# Patient Record
Sex: Female | Born: 1965
Health system: Southern US, Community
[De-identification: ages and names within clinical notes are randomized; demographics above are authoritative.]

## PROBLEM LIST (undated history)

## (undated) DIAGNOSIS — M1990 Unspecified osteoarthritis, unspecified site: Secondary | ICD-10-CM

## (undated) DIAGNOSIS — H269 Unspecified cataract: Secondary | ICD-10-CM

## (undated) DIAGNOSIS — F329 Major depressive disorder, single episode, unspecified: Secondary | ICD-10-CM

## (undated) DIAGNOSIS — R32 Unspecified urinary incontinence: Secondary | ICD-10-CM

## (undated) DIAGNOSIS — E079 Disorder of thyroid, unspecified: Secondary | ICD-10-CM

## (undated) DIAGNOSIS — R7303 Prediabetes: Secondary | ICD-10-CM

## (undated) DIAGNOSIS — E119 Type 2 diabetes mellitus without complications: Secondary | ICD-10-CM

## (undated) DIAGNOSIS — I10 Essential (primary) hypertension: Secondary | ICD-10-CM

## (undated) DIAGNOSIS — K219 Gastro-esophageal reflux disease without esophagitis: Secondary | ICD-10-CM

## (undated) DIAGNOSIS — R519 Headache, unspecified: Secondary | ICD-10-CM

## (undated) DIAGNOSIS — R011 Cardiac murmur, unspecified: Secondary | ICD-10-CM

## (undated) DIAGNOSIS — F419 Anxiety disorder, unspecified: Secondary | ICD-10-CM

## (undated) DIAGNOSIS — E785 Hyperlipidemia, unspecified: Secondary | ICD-10-CM

## (undated) DIAGNOSIS — I499 Cardiac arrhythmia, unspecified: Secondary | ICD-10-CM

## (undated) DIAGNOSIS — R5382 Chronic fatigue, unspecified: Secondary | ICD-10-CM

## (undated) DIAGNOSIS — I776 Arteritis, unspecified: Secondary | ICD-10-CM

## (undated) DIAGNOSIS — F32A Depression, unspecified: Secondary | ICD-10-CM

## (undated) DIAGNOSIS — G8929 Other chronic pain: Secondary | ICD-10-CM

## (undated) DIAGNOSIS — G43909 Migraine, unspecified, not intractable, without status migrainosus: Secondary | ICD-10-CM

## (undated) DIAGNOSIS — R51 Headache: Secondary | ICD-10-CM

## (undated) DIAGNOSIS — W57XXXA Bitten or stung by nonvenomous insect and other nonvenomous arthropods, initial encounter: Secondary | ICD-10-CM

## (undated) DIAGNOSIS — D649 Anemia, unspecified: Secondary | ICD-10-CM

## (undated) DIAGNOSIS — E7800 Pure hypercholesterolemia, unspecified: Secondary | ICD-10-CM

## (undated) DIAGNOSIS — M797 Fibromyalgia: Secondary | ICD-10-CM

## (undated) DIAGNOSIS — G4730 Sleep apnea, unspecified: Secondary | ICD-10-CM

## (undated) HISTORY — DX: Headache, unspecified: R51.9

## (undated) HISTORY — PX: NASAL SEPTUM SURGERY: SHX37

## (undated) HISTORY — DX: Anxiety disorder, unspecified: F41.9

## (undated) HISTORY — DX: Chronic fatigue, unspecified: R53.82

## (undated) HISTORY — DX: Cardiac murmur, unspecified: R01.1

## (undated) HISTORY — DX: Hyperlipidemia, unspecified: E78.5

## (undated) HISTORY — DX: Fibromyalgia: M79.7

## (undated) HISTORY — PX: OTHER SURGICAL HISTORY: SHX169

## (undated) HISTORY — DX: Anemia, unspecified: D64.9

## (undated) HISTORY — DX: Unspecified urinary incontinence: R32

## (undated) HISTORY — DX: Essential (primary) hypertension: I10

## (undated) HISTORY — DX: Sleep apnea, unspecified: G47.30

## (undated) HISTORY — DX: Pure hypercholesterolemia, unspecified: E78.00

## (undated) HISTORY — DX: Headache: R51

## (undated) HISTORY — DX: Major depressive disorder, single episode, unspecified: F32.9

## (undated) HISTORY — PX: SINOSCOPY: SHX187

## (undated) HISTORY — DX: Depression, unspecified: F32.A

## (undated) HISTORY — DX: Migraine, unspecified, not intractable, without status migrainosus: G43.909

## (undated) HISTORY — DX: Unspecified osteoarthritis, unspecified site: M19.90

## (undated) HISTORY — DX: Cardiac arrhythmia, unspecified: I49.9

## (undated) HISTORY — DX: Arteritis, unspecified: I77.6

## (undated) HISTORY — DX: Unspecified cataract: H26.9

## (undated) HISTORY — DX: Other chronic pain: G89.29

## (undated) HISTORY — DX: Disorder of thyroid, unspecified: E07.9

## (undated) HISTORY — DX: Gastro-esophageal reflux disease without esophagitis: K21.9

## (undated) HISTORY — DX: Prediabetes: R73.03

## (undated) HISTORY — DX: Bitten or stung by nonvenomous insect and other nonvenomous arthropods, initial encounter: W57.XXXA

---

## 2015-08-30 LAB — HEMOGLOBIN A1C: Hgb A1c MFr Bld: 6 % (ref 4.0–6.0)

## 2015-10-30 ENCOUNTER — Encounter: Payer: Self-pay | Admitting: "Endocrinology

## 2015-10-30 ENCOUNTER — Ambulatory Visit (INDEPENDENT_AMBULATORY_CARE_PROVIDER_SITE_OTHER): Payer: BLUE CROSS/BLUE SHIELD | Admitting: "Endocrinology

## 2015-10-30 VITALS — BP 129/85 | HR 94 | Ht 70.0 in | Wt 262.6 lb

## 2015-10-30 DIAGNOSIS — E6609 Other obesity due to excess calories: Secondary | ICD-10-CM

## 2015-10-30 DIAGNOSIS — E669 Obesity, unspecified: Secondary | ICD-10-CM | POA: Insufficient documentation

## 2015-10-30 DIAGNOSIS — R7303 Prediabetes: Secondary | ICD-10-CM | POA: Diagnosis not present

## 2015-10-30 DIAGNOSIS — E785 Hyperlipidemia, unspecified: Secondary | ICD-10-CM

## 2015-10-30 MED ORDER — METFORMIN HCL 500 MG PO TABS: 500.0000 mg | ORAL_TABLET | Freq: Every day | ORAL | Status: DC

## 2015-10-30 NOTE — Progress Notes (Signed)
Subjective:    Patient ID: Sydney Taylor, female    DOB: [birth year],    Past Medical History  Diagnosis Date  . Insect bite   . Anxiety   . Arrhythmia   . GERD (gastroesophageal reflux disease)   . Hypertension   . Urinary incontinence    Past Surgical History  Procedure Laterality Date  . Joint fusion on foot    . Nasal sinus surgery     Social History   Social History  . Marital Status: Single    Spouse Name: N/A  . Number of Children: N/A  . Years of Education: N/A   Social History Main Topics  . Smoking status: Former Research scientist (life sciences)  . Smokeless tobacco: Never Used  . Alcohol Use: 0.0 oz/week    0 Standard drinks or equivalent per week  . Drug Use: No  . Sexual Activity: No   Other Topics Concern  . None   Social History Narrative   Outpatient Encounter Prescriptions as of 10/30/2015  Medication Sig  . chlorthalidone (HYGROTON) 50 MG tablet Take 50 mg by mouth daily.  Marland Kitchen escitalopram (LEXAPRO) 10 MG tablet Take 10 mg by mouth daily.  Marland Kitchen lamoTRIgine (LAMICTAL) 100 MG tablet Take 100 mg by mouth daily.  Marland Kitchen lisinopril (PRINIVIL,ZESTRIL) 20 MG tablet Take 20 mg by mouth daily.  Marland Kitchen omeprazole (PRILOSEC) 20 MG capsule Take 20 mg by mouth daily.  . metFORMIN (GLUCOPHAGE) 500 MG tablet Take by mouth daily with breakfast.  . metFORMIN (GLUCOPHAGE) 500 MG tablet Take 1 tablet (500 mg total) by mouth daily with breakfast.  . metoprolol tartrate (LOPRESSOR) 25 MG tablet Take 25 mg by mouth 2 (two) times daily.   No facility-administered encounter medications on file as of 10/30/2015.   ALLERGIES: No Known Allergies VACCINATION STATUS:  There is no immunization history on file for this patient.  Diabetes She presents for her initial diabetic visit. Diabetes type: Prediabetes with A1c 6%. Her disease course has been stable. There are no hypoglycemic associated symptoms. Pertinent negatives for hypoglycemia include no confusion, headaches, pallor or seizures. There are no  diabetic associated symptoms. Pertinent negatives for diabetes include no chest pain, no polydipsia, no polyphagia and no polyuria. There are no hypoglycemic complications. Symptoms are stable. There are no diabetic complications. Risk factors for coronary artery disease include dyslipidemia, hypertension and obesity. When asked about current treatments, none were reported. Her weight is stable. An ACE inhibitor/angiotensin II receptor blocker is not being taken.  Hyperlipidemia This is a chronic problem. The current episode started more than 1 year ago. Pertinent negatives include no chest pain, myalgias or shortness of breath. She is currently on no antihyperlipidemic treatment.  Hypertension This is a chronic problem. The current episode started more than 1 year ago. Pertinent negatives include no chest pain, headaches, palpitations or shortness of breath. Past treatments include diuretics.     Review of Systems  Constitutional: Negative for unexpected weight change.  HENT: Negative for trouble swallowing and voice change.   Eyes: Negative for visual disturbance.  Respiratory: Negative for cough, shortness of breath and wheezing.   Cardiovascular: Negative for chest pain, palpitations and leg swelling.  Gastrointestinal: Negative for nausea, vomiting and diarrhea.  Endocrine: Negative for cold intolerance, heat intolerance, polydipsia, polyphagia and polyuria.  Musculoskeletal: Negative for myalgias and arthralgias.  Skin: Negative for color change, pallor, rash and wound.  Neurological: Negative for seizures and headaches.  Psychiatric/Behavioral: Negative for suicidal ideas and confusion.    Objective:  BP 129/85 mmHg  Pulse 94  Ht 5\' 10"  (1.778 m)  Wt 262 lb 9.6 oz (119.115 kg)  BMI 37.68 kg/m2  SpO2 98%  LMP 10/25/2015 (Approximate)  Wt Readings from Last 3 Encounters:  10/30/15 262 lb 9.6 oz (119.115 kg)    Physical Exam  Constitutional: She is oriented to person, place,  and time. She appears well-developed.  HENT:  Head: Normocephalic and atraumatic.  Eyes: EOM are normal.  Neck: Normal range of motion. Neck supple. No tracheal deviation present. No thyromegaly present.  Cardiovascular: Normal rate and regular rhythm.   Pulmonary/Chest: Effort normal and breath sounds normal.  Abdominal: Soft. Bowel sounds are normal. There is no tenderness. There is no guarding.  Musculoskeletal: Normal range of motion. She exhibits no edema.  Neurological: She is alert and oriented to person, place, and time. She has normal reflexes. No cranial nerve deficit. Coordination normal.  Skin: Skin is warm and dry. No rash noted. No erythema. No pallor.  Psychiatric: She has a normal mood and affect. Judgment normal.         Assessment & Plan:   1. Prediabetes Her a1c is 6%, has significant family hx of type 2 DM. - I have counseled the patient on diet management and weight loss, by adopting a carbohydrate restricted/protein rich diet.  - Suggestion is made for patient to avoid simple carbohydrates   from their diet including Cakes , Desserts, Ice Cream,  Soda (  diet and regular) , Sweet Tea , Candies,  Chips, Cookies, Artificial Sweeteners,   and "Sugar-free" Products . This will help patient to have stable blood glucose profile and potentially avoid unintended weight gain.  - I encouraged the patient to switch to  unprocessed or minimally processed complex starch and increased protein intake (animal or plant source), fruits, and vegetables.  - Patient is advised to stick to a routine mealtimes to eat 3 meals  a day and avoid unnecessary snacks ( to snack only to correct hypoglycemia).   - I have approached patient with the following individualized plan to manage diabetes and patient agrees:  -I discussed and initiated metformin 500mg  po qam after breakfast. She will need repeat basic metabolic  Panel, - Hemoglobin A1c, and - Lipid panel before next visit.  2.  Hyperlipidemia LDL 129, she is counseled on exercise, diet. She will have repeat fasting lipids . If LDL stays above 100, she will need low dose statin.  3. Obesity due to excess calories See #1 above.   I advised patient to maintain close follow up with their PCP for primary care needs. Follow up plan: Return in about 3 months (around 01/30/2016) for high cholesterol, prediabetes.  Glade Lloyd, MD Phone: [phone]  Fax: [phone]   10/30/2015, 4:00 PM

## 2015-10-30 NOTE — Patient Instructions (Signed)

## 2015-11-07 ENCOUNTER — Encounter: Payer: Self-pay | Admitting: Gastroenterology

## 2015-11-07 ENCOUNTER — Ambulatory Visit (INDEPENDENT_AMBULATORY_CARE_PROVIDER_SITE_OTHER): Payer: BLUE CROSS/BLUE SHIELD | Admitting: Gastroenterology

## 2015-11-07 VITALS — BP 118/90 | HR 100 | Ht 69.5 in | Wt 258.2 lb

## 2015-11-07 DIAGNOSIS — R195 Other fecal abnormalities: Secondary | ICD-10-CM

## 2015-11-07 DIAGNOSIS — K219 Gastro-esophageal reflux disease without esophagitis: Secondary | ICD-10-CM | POA: Diagnosis not present

## 2015-11-07 DIAGNOSIS — R1084 Generalized abdominal pain: Secondary | ICD-10-CM

## 2015-11-07 DIAGNOSIS — R079 Chest pain, unspecified: Secondary | ICD-10-CM

## 2015-11-07 MED ORDER — NA SULFATE-K SULFATE-MG SULF 17.5-3.13-1.6 GM/177ML PO SOLN: 1.0000 | Freq: Once | ORAL | Status: DC

## 2015-11-07 NOTE — Progress Notes (Deleted)
    History of Present Illness: This is a referred by No ref. provider found for the evaluation of ***  Review of Systems: Pertinent positive and negative review of systems were noted in the above HPI section. All other review of systems were otherwise negative.  Current Medications, Allergies, Past Medical History, Past Surgical History, Family History and Social History were reviewed in [employer] record.  Physical Exam: General: Well developed, well nourished, no acute distress Head: Normocephalic and atraumatic Eyes:  sclerae anicteric, EOMI Ears: Normal auditory acuity Mouth: No deformity or lesions Neck: Supple, no masses or thyromegaly Lungs: Clear throughout to auscultation Heart: Regular rate and rhythm; no murmurs, rubs or bruits Abdomen: Soft, non tender and non distended. No masses, hepatosplenomegaly or hernias noted. Normal Bowel sounds Rectal: Musculoskeletal: Symmetrical with no gross deformities  Skin: No lesions on visible extremities Pulses:  Normal pulses noted Extremities: No clubbing, cyanosis, edema or deformities noted Neurological: Alert oriented x 4, grossly nonfocal Cervical Nodes:  No significant cervical adenopathy Inguinal Nodes: No significant inguinal adenopathy Psychological:  Alert and cooperative. Normal mood and affect  Assessment and Recommendations:   cc: No referring provider defined for this encounter.

## 2015-11-07 NOTE — Progress Notes (Signed)
    History of Present Illness: This is a 49 year old female referred by Sydney Taylor, Sydney Taylor,* for the evaluation of heme positive stool, anemia, chest pain, abdominal pain and GERD. The patient relates a several year history of GERD that has been well controlled on AcipHex. She has had mild anemia documented on several occasions with recent hemoglobin 11.5. Recent stool tests were positive for occult blood. Patient notes episodes of postprandial generalized abdominal discomfort elicited with occasional gas and bloating that have happened frequently over the past 6 weeks. She also notes burning upper chest pain that is constant for the past 6 weeks and is not associated with meals or eating and does not change with meals or eating. Does not change with exertion or movement. This is very different than her prior GERD symptoms. Denies weight loss, constipation, diarrhea, change in stool caliber, melena, hematochezia, nausea, vomiting, dysphagia, reflux symptoms.  Review of Systems: Pertinent positive and negative review of systems were noted in the above HPI section. All other review of systems were otherwise negative.  Current Medications, Allergies, Past Medical History, Past Surgical History, Family History and Social History were reviewed in [employer] record.  Physical Exam: General: Well developed, well nourished, no acute distress Head: Normocephalic and atraumatic Eyes:  sclerae anicteric, EOMI Ears: Normal auditory acuity Mouth: No deformity or lesions Neck: Supple, no masses or thyromegaly Lungs: Clear throughout to auscultation Heart: Regular rate and rhythm; no murmurs, rubs or bruits Abdomen: Soft, non tender and non distended. No masses, hepatosplenomegaly or hernias noted. Normal Bowel sounds Rectal: deferred to colonoscopy Musculoskeletal: Symmetrical with no gross deformities  Skin: No lesions on visible extremities Pulses:  Normal pulses  noted Extremities: No clubbing, cyanosis, edema or deformities noted Neurological: Alert oriented x 4, grossly nonfocal Cervical Nodes:  No significant cervical adenopathy Inguinal Nodes: No significant inguinal adenopathy Psychological:  Alert and cooperative. Flat affect, difficult for her to provide details of symptoms, minimal eye contact.  Assessment and Recommendations:  1. Heme positive stool, anemia, chest pain, abdominal pain and GERD. Her chest pain does not appear to be gastrointestinal in etiology. Need to exclude colorectal neoplasms, ulcers, esophagitis and other disorders. Schedule colonoscopy and upper endoscopy. Continue antireflux measures and daily AcipHex. Consider abdominal imaging for further evaluation of abdominal pain if no source is determined on colonoscopy and upper endoscopy. The risks (including bleeding, perforation, infection, missed lesions, medication reactions and possible hospitalization or surgery if complications occur), benefits, and alternatives to colonoscopy with possible biopsy and possible polypectomy were discussed with the patient and they consent to proceed. The risks (including bleeding, perforation, infection, missed lesions, medication reactions and possible hospitalization or surgery if complications occur), benefits, and alternatives to endoscopy with possible biopsy and possible dilation were discussed with the patient and they consent to proceed.    cc: Yvone Neu, MD [facility] and [facility] [place] [address]

## 2015-11-07 NOTE — Patient Instructions (Signed)
You have been scheduled for an endoscopy and colonoscopy. Please follow the written instructions given to you at your visit today. Please pick up your prep supplies at the pharmacy within the next 1-3 days. If you use inhalers (even only as needed), please bring them with you on the day of your procedure. Your physician has requested that you go to www.startemmi.com and enter the access code given to you at your visit today. This web site gives a general overview about your procedure. However, you should still follow specific instructions given to you by our office regarding your preparation for the procedure.  Thank you for choosing me and [facility].  Pricilla Riffle. Dagoberto Ligas., MD., Marval Regal  cc: Lucianne Lei, MD

## 2015-12-31 HISTORY — PX: COLONOSCOPY: SHX174

## 2016-01-05 ENCOUNTER — Encounter: Payer: BLUE CROSS/BLUE SHIELD | Admitting: Gastroenterology

## 2016-02-01 ENCOUNTER — Ambulatory Visit: Payer: BLUE CROSS/BLUE SHIELD | Admitting: "Endocrinology

## 2016-08-19 LAB — HEMOGLOBIN A1C: HEMOGLOBIN A1C: 5.8

## 2016-08-26 LAB — LIPID PANEL
CHOLESTEROL: 205 mg/dL — AB (ref 0–200)
HDL: 54 mg/dL (ref 35–70)
LDL CALC: 122 mg/dL
Triglycerides: 145 mg/dL (ref 40–160)

## 2016-09-10 ENCOUNTER — Ambulatory Visit: Payer: BLUE CROSS/BLUE SHIELD | Admitting: "Endocrinology

## 2016-09-10 ENCOUNTER — Ambulatory Visit (INDEPENDENT_AMBULATORY_CARE_PROVIDER_SITE_OTHER): Payer: BLUE CROSS/BLUE SHIELD | Admitting: "Endocrinology

## 2016-09-10 ENCOUNTER — Encounter: Payer: Self-pay | Admitting: "Endocrinology

## 2016-09-10 VITALS — BP 124/83 | HR 91 | Ht 70.0 in | Wt 264.3 lb

## 2016-09-10 DIAGNOSIS — E6609 Other obesity due to excess calories: Secondary | ICD-10-CM

## 2016-09-10 DIAGNOSIS — R7303 Prediabetes: Secondary | ICD-10-CM

## 2016-09-10 DIAGNOSIS — E785 Hyperlipidemia, unspecified: Secondary | ICD-10-CM

## 2016-09-10 NOTE — Progress Notes (Signed)
Subjective:    Patient ID: Sydney Taylor, female    DOB: 12-Feb-1966,    Past Medical History:  Diagnosis Date  . Anemia   . Anxiety   . Arrhythmia   . Chronic headaches   . Depression   . Fibromyalgia   . GERD (gastroesophageal reflux disease)   . HLD (hyperlipidemia)   . Hypertension   . Insect bite   . Prediabetes   . Sleep apnea   . Urinary incontinence    Past Surgical History:  Procedure Laterality Date  . Joint fusion on foot Right   . NASAL SEPTUM SURGERY     Social History   Social History  . Marital status: Single    Spouse name: N/A  . Number of children: 0  . Years of education: N/A   Occupational History  . research associate/drug development    Social History Main Topics  . Smoking status: Former Smoker    Types: Cigarettes    Quit date: 12/30/1982  . Smokeless tobacco: Never Used  . Alcohol use 0.0 oz/week     Comment: 1 per month  . Drug use: No  . Sexual activity: No   Other Topics Concern  . None   Social History Narrative  . None   Outpatient Encounter Prescriptions as of 09/10/2016  Medication Sig  . chlorthalidone (HYGROTON) 50 MG tablet Take 50 mg by mouth daily.  Marland Kitchen escitalopram (LEXAPRO) 20 MG tablet Take 1 tablet by mouth daily.  Marland Kitchen lamoTRIgine (LAMICTAL) 100 MG tablet Take 100 mg by mouth daily.  Marland Kitchen lisinopril (PRINIVIL,ZESTRIL) 20 MG tablet Take 20 mg by mouth daily.  . metoprolol tartrate (LOPRESSOR) 25 MG tablet Take 25 mg by mouth 2 (two) times daily.  . montelukast (SINGULAIR) 10 MG tablet Take 1 tablet by mouth daily.  . Na Sulfate-K Sulfate-Mg Sulf SOLN Take 1 kit by mouth once.  . RABEprazole (ACIPHEX) 20 MG tablet Take 1 tablet by mouth daily.  . [DISCONTINUED] metFORMIN (GLUCOPHAGE) 500 MG tablet Take 1 tablet (500 mg total) by mouth daily with breakfast. (Patient not taking: Reported on 11/07/2015)   No facility-administered encounter medications on file as of 09/10/2016.    ALLERGIES: No Known  Allergies VACCINATION STATUS:  There is no immunization history on file for this patient.  Diabetes  She presents for her follow-up diabetic visit. Diabetes type: Prediabetes with A1c 6%. Her disease course has been stable. There are no hypoglycemic associated symptoms. Pertinent negatives for hypoglycemia include no confusion, headaches, pallor or seizures. There are no diabetic associated symptoms. Pertinent negatives for diabetes include no chest pain, no polydipsia, no polyphagia and no polyuria. There are no hypoglycemic complications. Symptoms are stable. There are no diabetic complications. Risk factors for coronary artery disease include dyslipidemia, hypertension and obesity. When asked about current treatments, none were reported. Her weight is stable. An ACE inhibitor/angiotensin II receptor blocker is not being taken.  Hyperlipidemia  This is a chronic problem. The current episode started more than 1 year ago. Pertinent negatives include no chest pain, myalgias or shortness of breath. She is currently on no antihyperlipidemic treatment.  Hypertension  This is a chronic problem. The current episode started more than 1 year ago. Pertinent negatives include no chest pain, headaches, palpitations or shortness of breath. Past treatments include diuretics.     Review of Systems  Constitutional: Positive for unexpected weight change.  HENT: Negative for trouble swallowing and voice change.   Eyes: Negative for visual disturbance.  Respiratory: Negative for cough, shortness of breath and wheezing.   Cardiovascular: Negative for chest pain, palpitations and leg swelling.  Gastrointestinal: Negative for diarrhea, nausea and vomiting.  Endocrine: Negative for cold intolerance, heat intolerance, polydipsia, polyphagia and polyuria.  Musculoskeletal: Negative for arthralgias and myalgias.  Skin: Negative for color change, pallor, rash and wound.  Neurological: Negative for seizures and  headaches.  Psychiatric/Behavioral: Negative for confusion and suicidal ideas.    Objective:    BP 124/83   Pulse 91   Ht '5\' 10"'$  (1.778 m)   Wt 264 lb 4.8 oz (119.9 kg)   BMI 37.92 kg/m   Wt Readings from Last 3 Encounters:  09/10/16 264 lb 4.8 oz (119.9 kg)  11/07/15 258 lb 4 oz (117.1 kg)  10/30/15 262 lb 9.6 oz (119.1 kg)    Physical Exam  Constitutional: She is oriented to person, place, and time. She appears well-developed.  HENT:  Head: Normocephalic and atraumatic.  Eyes: EOM are normal.  Neck: Normal range of motion. Neck supple. No tracheal deviation present. No thyromegaly present.  Cardiovascular: Normal rate and regular rhythm.   Pulmonary/Chest: Effort normal and breath sounds normal.  Abdominal: Soft. Bowel sounds are normal. There is no tenderness. There is no guarding.  Musculoskeletal: Normal range of motion. She exhibits no edema.  Neurological: She is alert and oriented to person, place, and time. She has normal reflexes. No cranial nerve deficit. Coordination normal.  Skin: Skin is warm and dry. No rash noted. No erythema. No pallor.  Psychiatric: She has a normal mood and affect. Judgment normal.    Recent Results (from the past 2160 hour(s))  Hemoglobin A1c     Status: None   Collection Time: 08/19/16 12:00 AM  Result Value Ref Range   Hemoglobin A1C 5.8   Lipid panel     Status: Abnormal   Collection Time: 08/26/16 12:00 AM  Result Value Ref Range   Triglycerides 145 40 - 160 mg/dL   Cholesterol 205 (A) 0 - 200 mg/dL   HDL 54 35 - 70 mg/dL   LDL Cholesterol 122 mg/dL     Assessment & Plan:   1. Prediabetes Her a1c is Slightly better at 5.8% compared to 6%, has significant family hx of type 2 DM. - I have counseled the patient on diet management and weight loss, by adopting a carbohydrate restricted/protein rich diet.  - Suggestion is made for patient to avoid simple carbohydrates   from their diet including Cakes , Desserts, Ice Cream,  Soda  (  diet and regular) , Sweet Tea , Candies,  Chips, Cookies, Artificial Sweeteners,   and "Sugar-free" Products . This will help patient to have stable blood glucose profile and potentially avoid unintended weight gain.  - I encouraged the patient to switch to  unprocessed or minimally processed complex starch and increased protein intake (animal or plant source), fruits, and vegetables.  - Patient is advised to stick to a routine mealtimes to eat 3 meals  a day and avoid unnecessary snacks ( to snack only to correct hypoglycemia).   - I have approached patient with the following individualized plan to manage diabetes and patient agrees:  - She did not tolerate  metformin  I prescribed last visit.  - She is advised to maximize her exercise and carbohydrate restriction.    2. Hyperlipidemia LDL is slightly better at 122 from 129, she is counseled on exercise, diet. She will have repeat fasting lipids . her HDL is .  She would like to avoid statin medications for now.   3. Obesity due to excess calories See #1 above. I have advised her to walk at least one hour 3-4 days a week. There is major drug drug interaction of Qsymia with Lexapro . Her next option is metabolic surgery which she is hesitating to proceed with this point.    I advised patient to maintain close follow up with their PCP for primary care needs. Follow up plan: Return in about 3 months (around 12/10/2016) for follow up with no labs.  Glade Lloyd, MD Phone: [phone]  Fax: [phone]   09/10/2016, 11:23 AM

## 2016-09-18 ENCOUNTER — Encounter: Payer: Self-pay | Admitting: "Endocrinology

## 2016-12-18 ENCOUNTER — Other Ambulatory Visit: Payer: Self-pay | Admitting: Orthopedic Surgery

## 2016-12-18 DIAGNOSIS — M25512 Pain in left shoulder: Secondary | ICD-10-CM

## 2016-12-19 ENCOUNTER — Ambulatory Visit: Payer: BLUE CROSS/BLUE SHIELD | Admitting: "Endocrinology

## 2016-12-28 ENCOUNTER — Ambulatory Visit
Admission: RE | Admit: 2016-12-28 | Discharge: 2016-12-28 | Disposition: A | Discharge status: Home / Self Care | Payer: BLUE CROSS/BLUE SHIELD | Source: Ambulatory Visit | Attending: Orthopedic Surgery | Admitting: Orthopedic Surgery

## 2016-12-28 DIAGNOSIS — M25512 Pain in left shoulder: Secondary | ICD-10-CM

## 2019-10-30 ENCOUNTER — Observation Stay (HOSPITAL_COMMUNITY)
Admission: EM | Admit: 2019-10-30 | Discharge: 2019-11-01 | Disposition: A | Discharge status: Home / Self Care | Payer: No Typology Code available for payment source | Attending: Family Medicine | Admitting: Family Medicine

## 2019-10-30 ENCOUNTER — Other Ambulatory Visit: Payer: Self-pay

## 2019-10-30 ENCOUNTER — Emergency Department (HOSPITAL_COMMUNITY): Payer: No Typology Code available for payment source

## 2019-10-30 ENCOUNTER — Encounter (HOSPITAL_COMMUNITY): Payer: Self-pay | Admitting: Emergency Medicine

## 2019-10-30 DIAGNOSIS — N390 Urinary tract infection, site not specified: Secondary | ICD-10-CM | POA: Diagnosis not present

## 2019-10-30 DIAGNOSIS — E876 Hypokalemia: Secondary | ICD-10-CM | POA: Insufficient documentation

## 2019-10-30 DIAGNOSIS — Z87891 Personal history of nicotine dependence: Secondary | ICD-10-CM | POA: Insufficient documentation

## 2019-10-30 DIAGNOSIS — R7303 Prediabetes: Secondary | ICD-10-CM | POA: Diagnosis not present

## 2019-10-30 DIAGNOSIS — R5383 Other fatigue: Secondary | ICD-10-CM | POA: Diagnosis present

## 2019-10-30 DIAGNOSIS — Z20828 Contact with and (suspected) exposure to other viral communicable diseases: Secondary | ICD-10-CM | POA: Insufficient documentation

## 2019-10-30 DIAGNOSIS — Z23 Encounter for immunization: Secondary | ICD-10-CM | POA: Diagnosis not present

## 2019-10-30 DIAGNOSIS — E669 Obesity, unspecified: Secondary | ICD-10-CM | POA: Insufficient documentation

## 2019-10-30 DIAGNOSIS — Z79899 Other long term (current) drug therapy: Secondary | ICD-10-CM | POA: Diagnosis not present

## 2019-10-30 DIAGNOSIS — E785 Hyperlipidemia, unspecified: Secondary | ICD-10-CM | POA: Diagnosis present

## 2019-10-30 DIAGNOSIS — E119 Type 2 diabetes mellitus without complications: Secondary | ICD-10-CM | POA: Diagnosis not present

## 2019-10-30 DIAGNOSIS — Z6837 Body mass index (BMI) 37.0-37.9, adult: Secondary | ICD-10-CM | POA: Insufficient documentation

## 2019-10-30 DIAGNOSIS — I10 Essential (primary) hypertension: Secondary | ICD-10-CM | POA: Insufficient documentation

## 2019-10-30 DIAGNOSIS — R000 Tachycardia, unspecified: Secondary | ICD-10-CM | POA: Diagnosis present

## 2019-10-30 HISTORY — DX: Type 2 diabetes mellitus without complications: E11.9

## 2019-10-30 LAB — CBC WITH DIFFERENTIAL/PLATELET
Abs Immature Granulocytes: 0.09 10*3/uL — ABNORMAL HIGH (ref 0.00–0.07)
Basophils Absolute: 0.1 10*3/uL (ref 0.0–0.1)
Basophils Relative: 1 %
Eosinophils Absolute: 0.3 10*3/uL (ref 0.0–0.5)
Eosinophils Relative: 3 %
HCT: 46.3 % — ABNORMAL HIGH (ref 36.0–46.0)
Hemoglobin: 15 g/dL (ref 12.0–15.0)
Immature Granulocytes: 1 %
Lymphocytes Relative: 22 %
Lymphs Abs: 2.5 10*3/uL (ref 0.7–4.0)
MCH: 29.8 pg (ref 26.0–34.0)
MCHC: 32.4 g/dL (ref 30.0–36.0)
MCV: 92 fL (ref 80.0–100.0)
Monocytes Absolute: 0.9 10*3/uL (ref 0.1–1.0)
Monocytes Relative: 8 %
Neutro Abs: 7.3 10*3/uL (ref 1.7–7.7)
Neutrophils Relative %: 65 %
Platelets: 327 10*3/uL (ref 150–400)
RBC: 5.03 MIL/uL (ref 3.87–5.11)
RDW: 13.3 % (ref 11.5–15.5)
WBC: 11.3 10*3/uL — ABNORMAL HIGH (ref 4.0–10.5)
nRBC: 0 % (ref 0.0–0.2)

## 2019-10-30 LAB — COMPREHENSIVE METABOLIC PANEL
ALT: 36 U/L (ref 0–44)
AST: 27 U/L (ref 15–41)
Albumin: 3.9 g/dL (ref 3.5–5.0)
Alkaline Phosphatase: 83 U/L (ref 38–126)
Anion gap: 12 (ref 5–15)
BUN: 13 mg/dL (ref 6–20)
CO2: 20 mmol/L — ABNORMAL LOW (ref 22–32)
Calcium: 8.9 mg/dL (ref 8.9–10.3)
Chloride: 105 mmol/L (ref 98–111)
Creatinine, Ser: 1.08 mg/dL — ABNORMAL HIGH (ref 0.44–1.00)
GFR calc Af Amer: >60 mL/min (ref >60)
GFR calc non Af Amer: 58 mL/min — ABNORMAL LOW (ref >60)
Glucose, Bld: 127 mg/dL — ABNORMAL HIGH (ref 70–99)
Potassium: 3.4 mmol/L — ABNORMAL LOW (ref 3.5–5.1)
Sodium: 137 mmol/L (ref 135–145)
Total Bilirubin: 0.6 mg/dL (ref 0.3–1.2)
Total Protein: 6.9 g/dL (ref 6.5–8.1)

## 2019-10-30 LAB — INFLUENZA PANEL BY PCR (TYPE A & B)
Influenza A By PCR: NEGATIVE
Influenza B By PCR: NEGATIVE

## 2019-10-30 LAB — URINALYSIS, ROUTINE W REFLEX MICROSCOPIC
Bilirubin Urine: NEGATIVE
Glucose, UA: NEGATIVE mg/dL
Hgb urine dipstick: NEGATIVE
Ketones, ur: NEGATIVE mg/dL
Nitrite: NEGATIVE
Protein, ur: NEGATIVE mg/dL
Specific Gravity, Urine: 1.019 (ref 1.005–1.030)
pH: 5 (ref 5.0–8.0)

## 2019-10-30 LAB — LACTIC ACID, PLASMA
Lactic Acid, Venous: 2.9 mmol/L (ref 0.5–1.9)
Lactic Acid, Venous: 2.4 mmol/L (ref 0.5–1.9)

## 2019-10-30 LAB — SARS CORONAVIRUS 2 BY RT PCR (HOSPITAL ORDER, PERFORMED IN ~~LOC~~ HOSPITAL LAB): SARS Coronavirus 2: NEGATIVE

## 2019-10-30 LAB — LIPASE, BLOOD: Lipase: 25 U/L (ref 11–51)

## 2019-10-30 MED ORDER — SODIUM CHLORIDE 0.9 % IV SOLN
1.0000 g | Freq: Once | INTRAVENOUS | Status: AC
  Administered 2019-10-30: 1 g via INTRAVENOUS
  Filled 2019-10-30: qty 10

## 2019-10-30 MED ORDER — SODIUM CHLORIDE 0.9 % IV BOLUS
500.0000 mL | Freq: Once | INTRAVENOUS | Status: AC
  Administered 2019-10-30: 500 mL via INTRAVENOUS

## 2019-10-30 MED ORDER — SODIUM CHLORIDE 0.9 % IV SOLN
500.0000 mg | Freq: Once | INTRAVENOUS | Status: AC
  Administered 2019-10-30: 500 mg via INTRAVENOUS
  Filled 2019-10-30: qty 500

## 2019-10-30 MED ORDER — SODIUM CHLORIDE 0.9 % IV SOLN
Freq: Once | INTRAVENOUS | Status: AC
  Administered 2019-10-31: via INTRAVENOUS

## 2019-10-30 NOTE — ED Notes (Signed)
Date and time results received: 10/30/19 11:04 PM  (use smartphrase ".now" to insert current time)  Test: lactic Critical Value: 2.4  Name of Provider Notified: hobson   Orders Received? Or Actions Taken?:

## 2019-10-30 NOTE — ED Provider Notes (Signed)
[facility] EMERGENCY DEPARTMENT Provider Note   CSN: [id number] Arrival date & time: 10/30/19  1918     History   Chief Complaint Chief Complaint  Patient presents with  . Body aches / headache    HPI Kalanie Fewell is a 53 y.o. female history of obesity, diabetes, fibromyalgia, hyperlipidemia, GERD presents today for viral illness.  Patient reports that 10 days ago she developed symptoms of a UTI, she has been treated with Bactrim for the past 7 days for these symptoms she reports that her UTI had resolved as of 5 days ago.  Patient reports that 4 days ago she developed fatigue, generalized body aches, chills without measured fever as well as cough and headache.  She states that symptoms have progressed since onset.  She describes generalized body aches as a moderate intensity aching sensation diffuse without focal tenderness.  Headache is a mild bilateral throbbing without radiation or aggravating/alleviating factors.  Cough nonproductive intermittent.  She reports that she was seen at an urgent care in [place] earlier today and was tested for COVID-19 virus as well as flu which were both reported negative.  Patient reports that she continued to feel unwell so she checked into the ER for further evaluation.  Denies measured fever, vision changes, balance issues, confusion, numbness/weakness, tingling, neck stiffness, chest pain/shortness of breath, abdominal pain, nausea/vomiting, diarrhea, dysuria/hematuria, extremity swelling/color change or any additional concerns.    HPI  Past Medical History:  Diagnosis Date  . Anemia   . Anxiety   . Arrhythmia   . Chronic headaches   . Depression   . Diabetes mellitus without complication ([place])   . Fibromyalgia   . GERD (gastroesophageal reflux disease)   . HLD (hyperlipidemia)   . Hypertension   . Insect bite   . Prediabetes   . Sleep apnea   . Urinary incontinence     Patient Active Problem List   Diagnosis Date  Noted  . Prediabetes 10/30/2015  . Hyperlipidemia 10/30/2015  . Obesity due to excess calories 10/30/2015    Past Surgical History:  Procedure Laterality Date  . Joint fusion on foot Right   . NASAL SEPTUM SURGERY       OB History   No obstetric history on file.      Home Medications    Prior to Admission medications   Medication Sig Start Date End Date Taking? Authorizing Provider  amitriptyline (ELAVIL) 25 MG tablet Take 37.5 mg by mouth at bedtime. 10/06/19  Yes [provider]  chlorthalidone (HYGROTON) 50 MG tablet Take 50 mg by mouth daily.   Yes [provider]  lamoTRIgine (LAMICTAL) 100 MG tablet Take 100 mg by mouth daily.   Yes [provider]  losartan (COZAAR) 25 MG tablet Take 25 mg by mouth daily.   Yes [provider]  REXULTI 2 MG TABS tablet Take 2 mg by mouth daily. 10/05/19  Yes [provider]  escitalopram (LEXAPRO) 20 MG tablet Take 1 tablet by mouth daily. 09/14/15   [provider]  lisinopril (PRINIVIL,ZESTRIL) 20 MG tablet Take 20 mg by mouth daily.    [provider]  metoprolol tartrate (LOPRESSOR) 25 MG tablet Take 25 mg by mouth 2 (two) times daily.    [provider]  montelukast (SINGULAIR) 10 MG tablet Take 1 tablet by mouth daily. 10/21/15   [provider]  Na Sulfate-K Sulfate-Mg Sulf SOLN Take 1 kit by mouth once. 11/07/15   Ladene Artist, MD  RABEprazole (ACIPHEX) 20 MG tablet Take 1 tablet by mouth daily. 10/21/15   [provider]    Family History Family History  Problem Relation Age of Onset  . Hypertension Father   . Hyperlipidemia Father   . Diabetes Father   . Hypertension Mother   . Hyperlipidemia Mother   . Diabetes Mellitus II Mother   . Congestive Heart Failure Mother   . Hypertension Sister   . Diabetes Sister   . Hypertension Brother   . Diabetes Maternal Grandmother   . Lupus Paternal Grandmother     Social History Social  History   Tobacco Use  . Smoking status: Former Smoker    Types: Cigarettes    Quit date: 12/30/1982    Years since quitting: 36.8  . Smokeless tobacco: Never Used  Substance Use Topics  . Alcohol use: Yes    Alcohol/week: 0.0 standard drinks    Comment: 1 per month  . Drug use: No     Allergies   Patient has no known allergies.   Review of Systems Review of Systems Ten systems are reviewed and are negative for acute change except as noted in the HPI   Physical Exam Updated Vital Signs BP (!) 137/104   Pulse (!) 120   Temp 98.4 F (36.9 C)   Resp 18   Ht _0  (1.778 m)   Wt 117.9 kg   SpO2 96%   BMI 37.31 kg/m   Physical Exam Constitutional:      General: She is not in acute distress.    Appearance: Normal appearance. She is well-developed. She is obese. She is not ill-appearing or diaphoretic.     Comments: Tired appearing  HENT:     Head: Normocephalic and atraumatic.     Right Ear: External ear normal.     Left Ear: External ear normal.     Nose: Nose normal.  Eyes:     General: Vision grossly intact. Gaze aligned appropriately.     Pupils: Pupils are equal, round, and reactive to light.  Neck:     Musculoskeletal: Normal range of motion.     Trachea: Trachea and phonation normal. No tracheal deviation.  Cardiovascular:     Rate and Rhythm: Regular rhythm. Tachycardia present.     Pulses: Normal pulses.     Heart sounds: Normal heart sounds.  Pulmonary:     Effort: Pulmonary effort is normal. No respiratory distress.     Breath sounds: Normal breath sounds.  Abdominal:     General: There is no distension.     Palpations: Abdomen is soft.     Tenderness: There is no abdominal tenderness. There is no guarding or rebound.  Musculoskeletal: Normal range of motion.     Right lower leg: No edema.     Left lower leg: No edema.  Skin:    General: Skin is warm and dry.  Neurological:     Mental Status: She is alert.     GCS: GCS eye subscore is 4. GCS  verbal subscore is 5. GCS motor subscore is 6.     Comments: Speech is clear and goal oriented, follows commands Major Cranial nerves without deficit, no facial droop Normal strength in upper and lower extremities bilaterally including dorsiflexion and plantar flexion, strong and equal grip strength Sensation normal to light and sharp touch Moves extremities without ataxia, coordination intact Normal finger to nose and rapid alternating movements Neg romberg, no pronator drift Normal gait  Psychiatric:  Behavior: Behavior normal.    ED Treatments / Results  Labs (all labs ordered are listed, but only abnormal results are displayed) Labs Reviewed - No data to display  EKG EKG Interpretation  Date/Time:  Saturday October 30 2019 20:55:08 EDT Ventricular Rate:  105 PR Interval:    QRS Duration: 82 QT Interval:  321 QTC Calculation: 425 R Axis:   76 Text Interpretation: Sinus tachycardia Low voltage, precordial leads Borderline T abnormalities, diffuse leads No STEMI Confirmed by Octaviano Glow [phone]) on 10/30/2019 9:24:43 PM   Radiology No results found.  Procedures Procedures (including critical care time)  Medications Ordered in ED Medications - No data to display   Initial Impression / Assessment and Plan / ED Course  I have reviewed the triage vital signs and the nursing notes.  Pertinent labs & imaging results that were available during my care of the patient were reviewed by me and considered in my medical decision making (see chart for details).    53 year old female arrives today with what appears to be a viral illness for the past 6 to 4 days.  She is currently being treated for your UTI but the symptoms have been resolving prior to onset of these new symptoms.  She is tired appearing on arrival but in no acute distress, nontoxic.  No focal neuro deficits on examination, no meningeal signs, she is mildly tachycardic with normal heart sounds, lung sounds  clear on examination, abdomen soft nontender without peritoneal signs, neurovascular tact to all 4 extremities without sign of DVT.  Patient does appear mildly dehydrated reports decreased p.o. over the past 4 days.  She has negative Covid test and negative flu test at an urgent care [place] of state however my suspicion is elevated for Covid at this time. - CBC with leukocytosis of 11.3 without left shift Lipase within normal limits CMP with creatinine 1.08 Urinalysis pending Beta-hCG pending Lactic 2.9 Chest x-ray:  IMPRESSION:  Mild opacity at the left base may represent atelectasis versus  subtle early infiltrate. Recommend clinical correlation and  attention on follow-up.   EKG: Sinus tachycardia Low voltage, precordial leads Borderline T abnormalities, diffuse leads No STEMI Confirmed by Octaviano Glow [phone]) on 10/30/2019 9:24:43 PM - Discussed case with Dr. Langston Masker, patient does not meet sepsis criteria at this time and has WBC under 12,000, regular respiratory rate and no fever.  Will begin patient on Rocephin/azithromycin for community-acquired pneumonia and give fluid bolus.  Suspicion is high for COVID-19 virus at this time will send rapid test however patient not requiring supplemental oxygen and her heart rate is improving with a fluid bolus.  Patient will need observation here in the ED prior to final disposition and await remainder of laboratory work-up.  Care handoff given to Waynard Reeds, PA-C at shift change who will follow pending labs, reassess and disposition per oncoming team.  Note: Portions of this report may have been transcribed using voice recognition software. Every effort was made to ensure accuracy; however, inadvertent computerized transcription errors may still be present. Final Clinical Impressions(s) / ED Diagnoses   Final diagnoses:  None    ED Discharge Orders    None       Gari Crown 10/30/19 2214    Wyvonnia Dusky, MD 10/31/19  1037

## 2019-10-30 NOTE — ED Notes (Signed)
Date and time results received: 10/30/19 2120  Test: Lactic Critical Value: 2.9  Name of Provider Notified: Langston Masker, MD

## 2019-10-30 NOTE — ED Triage Notes (Signed)
Pt came from [place] with c/o headache, body aches, chills, and fatigue since Sunday. States she was treated for a UTI and has felt bad since. Covid and flu were negative at DME.

## 2019-10-31 ENCOUNTER — Encounter (HOSPITAL_COMMUNITY): Payer: Self-pay | Admitting: Internal Medicine

## 2019-10-31 ENCOUNTER — Observation Stay (HOSPITAL_COMMUNITY): Payer: No Typology Code available for payment source

## 2019-10-31 DIAGNOSIS — R000 Tachycardia, unspecified: Secondary | ICD-10-CM | POA: Diagnosis not present

## 2019-10-31 DIAGNOSIS — E876 Hypokalemia: Secondary | ICD-10-CM | POA: Diagnosis not present

## 2019-10-31 DIAGNOSIS — N390 Urinary tract infection, site not specified: Secondary | ICD-10-CM | POA: Diagnosis not present

## 2019-10-31 DIAGNOSIS — E785 Hyperlipidemia, unspecified: Secondary | ICD-10-CM

## 2019-10-31 LAB — TROPONIN I (HIGH SENSITIVITY)
Troponin I (High Sensitivity): 2 ng/L (ref <18)
Troponin I (High Sensitivity): 3 ng/L (ref <18)

## 2019-10-31 LAB — CBC
HCT: 41.7 % (ref 36.0–46.0)
Hemoglobin: 13.2 g/dL (ref 12.0–15.0)
MCH: 29.7 pg (ref 26.0–34.0)
MCHC: 31.7 g/dL (ref 30.0–36.0)
MCV: 93.7 fL (ref 80.0–100.0)
Platelets: 277 10*3/uL (ref 150–400)
RBC: 4.45 MIL/uL (ref 3.87–5.11)
RDW: 13.2 % (ref 11.5–15.5)
WBC: 11.8 10*3/uL — ABNORMAL HIGH (ref 4.0–10.5)
nRBC: 0 % (ref 0.0–0.2)

## 2019-10-31 LAB — COMPREHENSIVE METABOLIC PANEL
ALT: 30 U/L (ref 0–44)
AST: 22 U/L (ref 15–41)
Albumin: 3.4 g/dL — ABNORMAL LOW (ref 3.5–5.0)
Alkaline Phosphatase: 70 U/L (ref 38–126)
Anion gap: 9 (ref 5–15)
BUN: 11 mg/dL (ref 6–20)
CO2: 21 mmol/L — ABNORMAL LOW (ref 22–32)
Calcium: 7.8 mg/dL — ABNORMAL LOW (ref 8.9–10.3)
Chloride: 105 mmol/L (ref 98–111)
Creatinine, Ser: 1.06 mg/dL — ABNORMAL HIGH (ref 0.44–1.00)
GFR calc Af Amer: >60 mL/min (ref >60)
GFR calc non Af Amer: 59 mL/min — ABNORMAL LOW (ref >60)
Glucose, Bld: 124 mg/dL — ABNORMAL HIGH (ref 70–99)
Potassium: 3 mmol/L — ABNORMAL LOW (ref 3.5–5.1)
Sodium: 135 mmol/L (ref 135–145)
Total Bilirubin: 0.6 mg/dL (ref 0.3–1.2)
Total Protein: 6 g/dL — ABNORMAL LOW (ref 6.5–8.1)

## 2019-10-31 LAB — HIV ANTIBODY (ROUTINE TESTING W REFLEX): HIV Screen 4th Generation wRfx: NONREACTIVE

## 2019-10-31 LAB — STREP PNEUMONIAE URINARY ANTIGEN: Strep Pneumo Urinary Antigen: NEGATIVE

## 2019-10-31 MED ORDER — SODIUM CHLORIDE 0.9 % IV SOLN: 1.0000 g | INTRAVENOUS | Status: DC

## 2019-10-31 MED ORDER — ACETAMINOPHEN 325 MG PO TABS: 650.0000 mg | ORAL_TABLET | Freq: Four times a day (QID) | ORAL | Status: DC | PRN

## 2019-10-31 MED ORDER — AMITRIPTYLINE HCL 25 MG PO TABS
37.5000 mg | ORAL_TABLET | Freq: Every day | ORAL | Status: DC
  Administered 2019-10-31: 35 mg via ORAL
  Filled 2019-10-31 (×2): qty 1

## 2019-10-31 MED ORDER — ARMODAFINIL 150 MG PO TABS: 150.0000 mg | ORAL_TABLET | Freq: Every morning | ORAL | Status: DC

## 2019-10-31 MED ORDER — LOSARTAN POTASSIUM 50 MG PO TABS
25.0000 mg | ORAL_TABLET | Freq: Every day | ORAL | Status: DC
  Administered 2019-10-31 – 2019-11-01 (×2): 25 mg via ORAL
  Filled 2019-10-31 (×2): qty 1

## 2019-10-31 MED ORDER — IOHEXOL 350 MG/ML SOLN
100.0000 mL | Freq: Once | INTRAVENOUS | Status: AC | PRN
  Administered 2019-10-31: 02:00:00 100 mL via INTRAVENOUS

## 2019-10-31 MED ORDER — ACETAMINOPHEN 650 MG RE SUPP: 650.0000 mg | Freq: Four times a day (QID) | RECTAL | Status: DC | PRN

## 2019-10-31 MED ORDER — ENOXAPARIN SODIUM 60 MG/0.6ML ~~LOC~~ SOLN
0.5000 mg/kg | SUBCUTANEOUS | Status: DC
  Administered 2019-10-31 – 2019-11-01 (×2): 60 mg via SUBCUTANEOUS
  Filled 2019-10-31 (×2): qty 0.6

## 2019-10-31 MED ORDER — SODIUM CHLORIDE 0.9 % IV SOLN: 500.0000 mg | INTRAVENOUS | Status: DC

## 2019-10-31 MED ORDER — ESCITALOPRAM OXALATE 10 MG PO TABS
20.0000 mg | ORAL_TABLET | Freq: Every day | ORAL | Status: DC
  Filled 2019-10-31 (×2): qty 2

## 2019-10-31 MED ORDER — METOPROLOL TARTRATE 25 MG PO TABS
25.0000 mg | ORAL_TABLET | Freq: Two times a day (BID) | ORAL | Status: DC
  Administered 2019-10-31 – 2019-11-01 (×3): 25 mg via ORAL
  Filled 2019-10-31 (×3): qty 1

## 2019-10-31 MED ORDER — BREXPIPRAZOLE 2 MG PO TABS
2.0000 mg | ORAL_TABLET | Freq: Every day | ORAL | Status: DC
  Administered 2019-10-31 – 2019-11-01 (×2): 2 mg via ORAL
  Filled 2019-10-31 (×4): qty 1

## 2019-10-31 MED ORDER — INFLUENZA VAC SPLIT QUAD 0.5 ML IM SUSY
0.5000 mL | PREFILLED_SYRINGE | INTRAMUSCULAR | Status: AC
  Administered 2019-11-01: 10:00:00 0.5 mL via INTRAMUSCULAR
  Filled 2019-10-31: qty 0.5

## 2019-10-31 MED ORDER — VITAMIN D 25 MCG (1000 UNIT) PO TABS
1000.0000 [IU] | ORAL_TABLET | Freq: Every day | ORAL | Status: DC
  Administered 2019-10-31 – 2019-11-01 (×2): 1000 [IU] via ORAL
  Filled 2019-10-31 (×3): qty 1

## 2019-10-31 MED ORDER — MONTELUKAST SODIUM 10 MG PO TABS
10.0000 mg | ORAL_TABLET | Freq: Every day | ORAL | Status: DC
  Administered 2019-10-31 – 2019-11-01 (×2): 10 mg via ORAL
  Filled 2019-10-31 (×2): qty 1

## 2019-10-31 MED ORDER — POTASSIUM CHLORIDE IN NACL 20-0.9 MEQ/L-% IV SOLN
INTRAVENOUS | Status: AC
  Administered 2019-10-31: 03:00:00 via INTRAVENOUS
  Filled 2019-10-31: qty 1000

## 2019-10-31 MED ORDER — PANTOPRAZOLE SODIUM 40 MG PO TBEC
40.0000 mg | DELAYED_RELEASE_TABLET | Freq: Every day | ORAL | Status: DC
  Filled 2019-10-31 (×2): qty 1

## 2019-10-31 MED ORDER — CHLORTHALIDONE 25 MG PO TABS
50.0000 mg | ORAL_TABLET | Freq: Every day | ORAL | Status: DC
  Administered 2019-10-31: 10:00:00 50 mg via ORAL
  Filled 2019-10-31 (×2): qty 1

## 2019-10-31 MED ORDER — LAMOTRIGINE 100 MG PO TABS
100.0000 mg | ORAL_TABLET | Freq: Every day | ORAL | Status: DC
  Administered 2019-10-31 – 2019-11-01 (×2): 100 mg via ORAL
  Filled 2019-10-31: qty 1
  Filled 2019-10-31: qty 4

## 2019-10-31 NOTE — Progress Notes (Signed)
Patient admitted to the hospital earlier this morning by Dr. Maudie Mercury.  Patient seen and examined.  Continues to have suprapubic discomfort.  Denies any chest pain or shortness of breath.  A/p:  1. Urinary tract infection.  Patient recently underwent treatment for urinary tract infection with a course of Bactrim.  She has continued symptoms of UTI and urinalysis indicates possible UTI.  She been started on Rocephin.  Urine culture in process. 2. Sepsis.  Patient initially noted to have tachycardia and tachypnea earlier in her ER course.  Source of infection would be urinary tract.  She is also noted to have lactic acidosis consistent with hypoperfusion.  Blood cultures have been sent.  She is on broad-spectrum antibiotics.  Continue IV fluids.  Currently, hemodynamics are stable. 3. Kalemia.  Replaced 4. Hypertension.  Continued on losartan, metoprolol, hold chlorthalidone. 5. GERD.  Continue PPI 6. Anxiety/depression.  Continue Lamictal, Lexapro, Elavil  [employer]

## 2019-10-31 NOTE — H&P (Signed)
TRH H&P    Patient Demographics:    Sydney Taylor, is a 53 y.o. female  MRN: [id number]  DOB - 1966/02/07  Admit Date - 10/30/2019  Referring MD/NP/PA: Lily Kocher  Outpatient Primary MD for the patient is Earney Mallet, MD  Patient coming from:  home  Chief complaint- not feeling well, achiness   HPI:    Sydney Taylor  is a 53 y.o. female, w hypertension, hyperlipidemia, Dm2, anxiety/depression, Fibromyalgia, Anemia, w recent UTI about 1 week ago , apparently presents with c/o achiness, subjective fever, and generally not feeling well.  Pt denies dysuria, hematuria, flank pain, emesis.    In ED,  T 98.4, P 120, R 18, Bp 137/104  Pox 96% on RA WT 117.9kg  CTA chest IMPRESSION: No acute intrathoracic pathology. No CT evidence of central pulmonary artery embolus.  Wbc 11.3, Hgb 15.0, Plt 327 Na 137, K 3.4, Bun 13, Creatinine 1.08   Ast 27, Alt 36  Lipase 25  covid -19  Negative  Lactic acid 2.9-> 2.4  Urinalysis wbc 21-50  Rbc 0-5  Pt given rocephin 56m iv x1 , zithromax 5090miv x1  Pt will be admitted for acute uti, hypokalemia.       Review of systems:    In addition to the HPI above,    No Headache, No changes with Vision or hearing, No problems swallowing food or Liquids, No Chest pain, Cough or Shortness of Breath, No Abdominal pain, No Nausea or Vomiting, bowel movements are regular, No Blood in stool or Urine, No dysuria, No new skin rashes or bruises, No new joints pains-aches,  No new weakness, tingling, numbness in any extremity, No recent weight gain or loss, No polyuria, polydypsia or polyphagia, No significant Mental Stressors.  All other systems reviewed and are negative.    Past History of the following :    Past Medical History:  Diagnosis Date  . Anemia   . Anxiety   . Arrhythmia   . Chronic headaches   . Depression   . Diabetes  mellitus without complication ([facility]  . Fibromyalgia   . GERD (gastroesophageal reflux disease)   . HLD (hyperlipidemia)   . Hypertension   . Insect bite   . Prediabetes   . Sleep apnea   . Urinary incontinence       Past Surgical History:  Procedure Laterality Date  . Joint fusion on foot Right   . NASAL SEPTUM SURGERY        Social History:      Social History   Tobacco Use  . Smoking status: Former Smoker    Types: Cigarettes    Quit date: 12/30/1982    Years since quitting: 36.8  . Smokeless tobacco: Never Used  Substance Use Topics  . Alcohol use: Yes    Alcohol/week: 0.0 standard drinks    Comment: 1 per month       Family History :     Family History  Problem Relation Age of Onset  . Hypertension Father   .  Hyperlipidemia Father   . Diabetes Father   . Hypertension Mother   . Hyperlipidemia Mother   . Diabetes Mellitus II Mother   . Congestive Heart Failure Mother   . Hypertension Sister   . Diabetes Sister   . Hypertension Brother   . Diabetes Maternal Grandmother   . Lupus Paternal Grandmother        Home Medications:   Prior to Admission medications   Medication Sig Start Date End Date Taking? Authorizing Provider  amitriptyline (ELAVIL) 25 MG tablet Take 37.5 mg by mouth at bedtime. 10/06/19  Yes [provider]  Armodafinil 150 MG tablet Take 150 mg by mouth every morning. 09/15/19  Yes [provider]  chlorthalidone (HYGROTON) 50 MG tablet Take 50 mg by mouth daily.   Yes [provider]  lamoTRIgine (LAMICTAL) 100 MG tablet Take 100 mg by mouth daily.   Yes [provider]  losartan (COZAAR) 25 MG tablet Take 25 mg by mouth daily.   Yes [provider]  NATURAL VITAMIN D-3 125 MCG (5000 UT) TABS Take 1 tablet by mouth daily. 08/20/19  Yes [provider]  REXULTI 2 MG TABS tablet Take 2 mg by mouth daily. 10/05/19  Yes [provider]  escitalopram (LEXAPRO) 20 MG tablet Take 1  tablet by mouth daily. 09/14/15   [provider]  lisinopril (PRINIVIL,ZESTRIL) 20 MG tablet Take 20 mg by mouth daily.    [provider]  metoprolol tartrate (LOPRESSOR) 25 MG tablet Take 25 mg by mouth 2 (two) times daily.    [provider]  montelukast (SINGULAIR) 10 MG tablet Take 1 tablet by mouth daily. 10/21/15   [provider]  Na Sulfate-K Sulfate-Mg Sulf SOLN Take 1 kit by mouth once. 11/07/15   Ladene Artist, MD  RABEprazole (ACIPHEX) 20 MG tablet Take 1 tablet by mouth daily. 10/21/15   [provider]     Allergies:    No Known Allergies   Physical Exam:   Vitals  Blood pressure (!) 102/53, pulse 93, temperature 98.4 F (36.9 C), resp. rate 12, height 5' 10" (1.778 m), weight 117.9 kg, SpO2 92 %.  1.  General: axoxo3  2. Psychiatric: Euthymic  3. Neurologic: cn2-12 intact , reflexes 2+ symmetric, diffuse with no clonus, motor 5/5 in all 4 ext   4. HEENMT:  Anicteric, pupils 1.11m symmetric, direct, consensual, near intact Neck: no jvd  5. Respiratory : CTAB  6. Cardiovascular : rrr s1, s2,   7. Gastrointestinal:  Abd: soft, nt, nd, +bs  8. Skin:  Ext: no c/c/e,  No rash  9.Musculoskeletal:  Good ROM    Data Review:    CBC Recent Labs  Lab 10/30/19 2044 10/31/19 0152  WBC 11.3* 11.8*  HGB 15.0 13.2  HCT 46.3* 41.7  PLT 327 277  MCV 92.0 93.7  MCH 29.8 29.7  MCHC 32.4 31.7  RDW 13.3 13.2  LYMPHSABS 2.5  --   MONOABS 0.9  --   EOSABS 0.3  --   BASOSABS 0.1  --    ------------------------------------------------------------------------------------------------------------------  Results for orders placed or performed during the hospital encounter of 10/30/19 (from the past 48 hour(s))  Lactic acid, plasma     Status: Abnormal   Collection Time: 10/30/19  8:29 PM  Result Value Ref Range   Lactic Acid, Venous 2.9 (HH) 0.5 - 1.9 mmol/L    Comment: CRITICAL RESULT CALLED TO, READ BACK BY  AND VERIFIED WITH: SAPPLET,J @ 2121 ON  10/30/19 BY JUW Performed at [facility], [address] [place], [place] [address]   CBC with Differential     Status: Abnormal   Collection Time: 10/30/19  8:44 PM  Result Value Ref Range   WBC 11.3 (H) 4.0 - 10.5 K/uL   RBC 5.03 3.87 - 5.11 MIL/uL   Hemoglobin 15.0 12.0 - 15.0 g/dL   HCT 46.3 (H) 36.0 - 46.0 %   MCV 92.0 80.0 - 100.0 fL   MCH 29.8 26.0 - 34.0 pg   MCHC 32.4 30.0 - 36.0 g/dL   RDW 13.3 11.5 - 15.5 %   Platelets 327 150 - 400 K/uL   nRBC 0.0 0.0 - 0.2 %   Neutrophils Relative % 65 %   Neutro Abs 7.3 1.7 - 7.7 K/uL   Lymphocytes Relative 22 %   Lymphs Abs 2.5 0.7 - 4.0 K/uL   Monocytes Relative 8 %   Monocytes Absolute 0.9 0.1 - 1.0 K/uL   Eosinophils Relative 3 %   Eosinophils Absolute 0.3 0.0 - 0.5 K/uL   Basophils Relative 1 %   Basophils Absolute 0.1 0.0 - 0.1 K/uL   Immature Granulocytes 1 %   Abs Immature Granulocytes 0.09 (H) 0.00 - 0.07 K/uL    Comment: Performed at [facility], [address], [place], [place] [address]  Comprehensive metabolic panel     Status: Abnormal   Collection Time: 10/30/19  8:44 PM  Result Value Ref Range   Sodium 137 135 - 145 mmol/L   Potassium 3.4 (L) 3.5 - 5.1 mmol/L   Chloride 105 98 - 111 mmol/L   CO2 20 (L) 22 - 32 mmol/L   Glucose, Bld 127 (H) 70 - 99 mg/dL   BUN 13 6 - 20 mg/dL   Creatinine, Ser 1.08 (H) 0.44 - 1.00 mg/dL   Calcium 8.9 8.9 - 10.3 mg/dL   Total Protein 6.9 6.5 - 8.1 g/dL   Albumin 3.9 3.5 - 5.0 g/dL   AST 27 15 - 41 U/L   ALT 36 0 - 44 U/L   Alkaline Phosphatase 83 38 - 126 U/L   Total Bilirubin 0.6 0.3 - 1.2 mg/dL   GFR calc non Af Amer 58 (L) >60 mL/min   GFR calc Af Amer >60 >60 mL/min   Anion gap 12 5 - 15    Comment: Performed at [facility], [address], [place], [place] [address]  Lipase, blood     Status: None   Collection Time: 10/30/19  8:44 PM  Result Value Ref Range   Lipase 25 11 - 51 U/L    Comment: Performed at [facility], [address], [place], [place] [address]  Influenza panel by PCR (type A & B)     Status: None   Collection Time: 10/30/19  9:40 PM  Result Value Ref Range   Influenza A By PCR NEGATIVE NEGATIVE   Influenza B By PCR NEGATIVE NEGATIVE    Comment: (NOTE) The Xpert Xpress Flu assay is intended as an aid in the diagnosis of  influenza and should not be used as a sole basis for treatment.  This  assay is FDA approved for nasopharyngeal swab specimens only. Nasal  washings and aspirates are unacceptable for Xpert Xpress Flu testing. Performed at [facility], [address] [place], [place] [address]   SARS Coronavirus 2 by RT PCR (hospital order, performed in [facility] hospital lab) Nasopharyngeal Nasopharyngeal Swab     Status: None   Collection  Time: 10/30/19  9:51 PM   Specimen: Nasopharyngeal Swab  Result Value Ref Range   SARS Coronavirus 2 NEGATIVE NEGATIVE    Comment: (NOTE) If result is NEGATIVE SARS-CoV-2 target nucleic acids are NOT DETECTED. The SARS-CoV-2 RNA is generally detectable in upper and lower  respiratory specimens during the acute phase of infection. The lowest  concentration of SARS-CoV-2 viral copies this assay can detect is 250  copies / mL. A negative result does not preclude SARS-CoV-2 infection  and should not be used as the sole basis for treatment or other  patient management decisions.  A negative result may occur with  improper specimen collection / handling, submission of specimen other  than nasopharyngeal swab, presence of viral mutation(s) within the  areas targeted by this assay, and inadequate number of viral copies  (<250 copies / mL). A negative result must be combined with clinical  observations, patient history, and epidemiological information. If result is POSITIVE SARS-CoV-2 target nucleic acids are DETECTED. The SARS-CoV-2 RNA is generally detectable in upper and lower  respiratory specimens dur ing the acute phase of infection.   Positive  results are indicative of active infection with SARS-CoV-2.  Clinical  correlation with patient history and other diagnostic information is  necessary to determine patient infection status.  Positive results do  not rule out bacterial infection or co-infection with other viruses. If result is PRESUMPTIVE POSTIVE SARS-CoV-2 nucleic acids MAY BE PRESENT.   A presumptive positive result was obtained on the submitted specimen  and confirmed on repeat testing.  While 2019 novel coronavirus  (SARS-CoV-2) nucleic acids may be present in the submitted sample  additional confirmatory testing may be necessary for epidemiological  and / or clinical management purposes  to differentiate between  SARS-CoV-2 and other Sarbecovirus currently known to infect humans.  If clinically indicated additional testing with an alternate test  methodology [phone]) is advised. The SARS-CoV-2 RNA is generally  detectable in upper and lower respiratory sp ecimens during the acute  phase of infection. The expected result is Negative. Fact Sheet for Patients:  StrictlyIdeas.no Fact Sheet for Healthcare Providers: BankingDealers.co.za This test is not yet approved or cleared by the [place] FDA and has been authorized for detection and/or diagnosis of SARS-CoV-2 by FDA under an Emergency Use Authorization (EUA).  This EUA will remain in effect (meaning this test can be used) for the duration of the COVID-19 declaration under Section 564(b)(1) of the Act, 21 U.S.C. section 360bbb-3(b)(1), unless the authorization is terminated or revoked sooner. Performed at [facility], [address] [place], [place] [address]   Lactic acid, plasma     Status: Abnormal   Collection Time: 10/30/19 10:01 PM  Result Value Ref Range   Lactic Acid, Venous 2.4 (HH) 0.5 - 1.9 mmol/L    Comment: CRITICAL RESULT CALLED TO, READ BACK BY AND VERIFIED WITH: NICHOLS,K. AT 2303 ON  10/30/2019 BY EVA Performed at [facility], [address], [place], [place] [address]   Urinalysis, Routine w reflex microscopic     Status: Abnormal   Collection Time: 10/30/19 10:20 PM  Result Value Ref Range   Color, Urine YELLOW YELLOW   APPearance HAZY (A) CLEAR   Specific Gravity, Urine 1.019 1.005 - 1.030   pH 5.0 5.0 - 8.0   Glucose, UA NEGATIVE NEGATIVE mg/dL   Hgb urine dipstick NEGATIVE NEGATIVE   Bilirubin Urine NEGATIVE NEGATIVE   Ketones, ur NEGATIVE NEGATIVE mg/dL   Protein, ur NEGATIVE NEGATIVE mg/dL  Nitrite NEGATIVE NEGATIVE   Leukocytes,Ua TRACE (A) NEGATIVE   RBC / HPF 0-5 0 - 5 RBC/hpf   WBC, UA 21-50 0 - 5 WBC/hpf   Bacteria, UA RARE (A) NONE SEEN   Squamous Epithelial / LPF 0-5 0 - 5   Mucus PRESENT    Hyaline Casts, UA PRESENT     Comment: Performed at [facility], [address], [place], [place] [address]  Troponin I (High Sensitivity)     Status: None   Collection Time: 10/31/19  1:52 AM  Result Value Ref Range   Troponin I (High Sensitivity) 2 <18 ng/L    Comment: (NOTE) Elevated high sensitivity troponin I (hsTnI) values and significant  changes across serial measurements may suggest ACS but many other  chronic and acute conditions are known to elevate hsTnI results.  Refer to the "Links" section for chest pain algorithms and additional  guidance. Performed at [facility], [address] [place], [place] [address]   Comprehensive metabolic panel     Status: Abnormal   Collection Time: 10/31/19  1:52 AM  Result Value Ref Range   Sodium 135 135 - 145 mmol/L   Potassium 3.0 (L) 3.5 - 5.1 mmol/L   Chloride 105 98 - 111 mmol/L   CO2 21 (L) 22 - 32 mmol/L   Glucose, Bld 124 (H) 70 - 99 mg/dL   BUN 11 6 - 20 mg/dL   Creatinine, Ser 1.06 (H) 0.44 - 1.00 mg/dL   Calcium 7.8 (L) 8.9 - 10.3 mg/dL   Total Protein 6.0 (L) 6.5 - 8.1 g/dL   Albumin 3.4 (L) 3.5 - 5.0 g/dL   AST 22 15 - 41 U/L   ALT 30 0 - 44 U/L   Alkaline Phosphatase 70 38 - 126 U/L    Total Bilirubin 0.6 0.3 - 1.2 mg/dL   GFR calc non Af Amer 59 (L) >60 mL/min   GFR calc Af Amer >60 >60 mL/min   Anion gap 9 5 - 15    Comment: Performed at [facility], [address] [place], [place] [address]  CBC     Status: Abnormal   Collection Time: 10/31/19  1:52 AM  Result Value Ref Range   WBC 11.8 (H) 4.0 - 10.5 K/uL   RBC 4.45 3.87 - 5.11 MIL/uL   Hemoglobin 13.2 12.0 - 15.0 g/dL   HCT 41.7 36.0 - 46.0 %   MCV 93.7 80.0 - 100.0 fL   MCH 29.7 26.0 - 34.0 pg   MCHC 31.7 30.0 - 36.0 g/dL   RDW 13.2 11.5 - 15.5 %   Platelets 277 150 - 400 K/uL   nRBC 0.0 0.0 - 0.2 %    Comment: Performed at [facility], [address], [place], [place] [address]    Chemistries  Recent Labs  Lab 10/30/19 2044 10/31/19 0152  NA 137 135  K 3.4* 3.0*  CL 105 105  CO2 20* 21*  GLUCOSE 127* 124*  BUN 13 11  CREATININE 1.08* 1.06*  CALCIUM 8.9 7.8*  AST 27 22  ALT 36 30  ALKPHOS 83 70  BILITOT 0.6 0.6   ------------------------------------------------------------------------------------------------------------------  ------------------------------------------------------------------------------------------------------------------ GFR: Estimated Creatinine Clearance: 84.6 mL/min (A) (by C-G formula based on SCr of 1.06 mg/dL (H)). Liver Function Tests: Recent Labs  Lab 10/30/19 2044 10/31/19 0152  AST 27 22  ALT 36 30  ALKPHOS 83 70  BILITOT 0.6 0.6  PROT 6.9 6.0*  ALBUMIN 3.9 3.4*   Recent Labs  Lab  10/30/19 2044  LIPASE 25   No results for input(s): AMMONIA in the last 168 hours. Coagulation Profile: No results for input(s): INR, PROTIME in the last 168 hours. Cardiac Enzymes: No results for input(s): CKTOTAL, CKMB, CKMBINDEX, TROPONINI in the last 168 hours. BNP (last 3 results) No results for input(s): PROBNP in the last 8760 hours. HbA1C: No results for input(s): HGBA1C in the last 72 hours. CBG: No results for input(s): GLUCAP in the last 168 hours.  Lipid Profile: No results for input(s): CHOL, HDL, LDLCALC, TRIG, CHOLHDL, LDLDIRECT in the last 72 hours. Thyroid Function Tests: No results for input(s): TSH, T4TOTAL, FREET4, T3FREE, THYROIDAB in the last 72 hours. Anemia Panel: No results for input(s): VITAMINB12, FOLATE, FERRITIN, TIBC, IRON, RETICCTPCT in the last 72 hours.  --------------------------------------------------------------------------------------------------------------- Urine analysis:    Component Value Date/Time   COLORURINE YELLOW 10/30/2019 2220   APPEARANCEUR HAZY (A) 10/30/2019 2220   LABSPEC 1.019 10/30/2019 2220   PHURINE 5.0 10/30/2019 2220   GLUCOSEU NEGATIVE 10/30/2019 2220   HGBUR NEGATIVE 10/30/2019 2220   [place] 10/30/2019 2220   KETONESUR NEGATIVE 10/30/2019 2220   PROTEINUR NEGATIVE 10/30/2019 2220   NITRITE NEGATIVE 10/30/2019 2220   LEUKOCYTESUR TRACE (A) 10/30/2019 2220      Imaging Results:    Ct Angio Chest Pe W And/or Wo Contrast  Result Date: 10/31/2019 CLINICAL DATA:  53 year old female with tachycardia and body aches. Abnormal chest x-ray. EXAM: CT ANGIOGRAPHY CHEST WITH CONTRAST TECHNIQUE: Multidetector CT imaging of the chest was performed using the standard protocol during bolus administration of intravenous contrast. Multiplanar CT image reconstructions and MIPs were obtained to evaluate the vascular anatomy. CONTRAST:  169m OMNIPAQUE IOHEXOL 350 MG/ML SOLN COMPARISON:  Chest radiograph dated 10/30/2019 FINDINGS: Cardiovascular: There is no cardiomegaly or pericardial effusion. The thoracic aorta is unremarkable. The origins of the great vessels of the aortic arch appear patent as visualized. Evaluation of the pulmonary arteries is somewhat limited due to suboptimal opacification and timing of the contrast. No definite large or central pulmonary artery embolus identified. Mediastinum/Nodes: There is no hilar or mediastinal adenopathy. The esophagus and the thyroid gland  are grossly unremarkable. No mediastinal fluid collection. Lungs/Pleura: Minimal bibasilar atelectasis. No focal consolidation, pleural effusion, pneumothorax. The central airways are patent. Upper Abdomen: Fatty liver. The visualized upper abdomen is otherwise unremarkable. Musculoskeletal: Degenerative changes of the spine. No acute osseous pathology. Review of the MIP images confirms the above findings. IMPRESSION: No acute intrathoracic pathology. No CT evidence of central pulmonary artery embolus. Electronically Signed   By: AAnner CreteM.D.   On: 10/31/2019 01:00   Dg Chest Portable 1 View  Result Date: 10/30/2019 CLINICAL DATA:  Viral illness. EXAM: PORTABLE CHEST 1 VIEW COMPARISON:  None. FINDINGS: There is mild opacity at the left base. The heart, hila, mediastinum, lungs, and pleura are otherwise unremarkable. IMPRESSION: Mild opacity at the left base may represent atelectasis versus subtle early infiltrate. Recommend clinical correlation and attention on follow-up. Electronically Signed   By: DDorise BullionIII M.D   On: 10/30/2019 21:11    st at 105, nl axis, nl int, no st-t changes c/w ischemia   Assessment & Plan:    Principal Problem:   Acute lower UTI Active Problems:   Prediabetes   Hyperlipidemia   Hypokalemia   Tachycardia  Acute lower uti Urine culture Blood culture x2 Start Rocephin 1gm iv qday  Tachycardia (chronic per patient) Check tsh Check trop I  Check cardiac echo   Hypokalemia  Replete Check cmp in am  Hypertension Cont Losartan 64m po qday Cont Metoprolol 265mpo bid Cont Chlorthalidone 5037mo qday  Anxiety/ depression Cont Elavil 37.5mg14m qhs Cont Lamictal 100mg45mqday Cont Lexapro 20mg 68mday Cont Rexulti  Gerd Cont PPI  OSA Cont Armodafinil 150mg p31may   DVT Prophylaxis-   Lovenox - SCDs   AM Labs Ordered, also please review Full Orders  Family Communication: Admission, patients condition and plan of care including  tests being ordered have been discussed with the patient  who indicate understanding and agree with the plan and Code Status.  Code Status:  FULL CODE per patient  Admission status: Observation: Based on patients clinical presentation and evaluation of above clinical data, I have made determination that patient meets observation criteria at this time.    Time spent in minutes : 55 minutes    KJani Gravel 10/31/2019 at 2:47 AM

## 2019-10-31 NOTE — ED Provider Notes (Signed)
Received patient at end of shift.  Patient is a 53 year old female who presents to the emergency department with body aches and headache.  The patient has been dealing with a urinary tract infection over the past week.  She is now having body aches, chills, fever, headache, and cough.  She was tested in [place] for Covid and flu and both of these were negative.  She presents to the emergency department now for additional evaluation as she states that she did not feel well.  Work-up is ongoing.  Covid test and lactic acids pending.  Patient has been treated with Rocephin for the urinary tract infection up to this point.  Patient was given IV fluids, and after IV fluids the lactic acid improved from 2.9-2.4, however still critical value.  Chest x-ray shows a mild opacity at the left base that may represent atelectasis versus early infiltrate.  Patient will be treated with Zithromax, as she has already received Rocephin.  I have discussed the findings with the patient in terms of which she understands. Pt remains tachycardic, additional IV fluids orderd.  Case discussed with Dr. Maudie Mercury.  CT angio chest ordered.  He will see the patient for admission.   Lily Kocher, PA-C 10/31/19 1116    Wyvonnia Dusky, MD 10/31/19 1325

## 2019-11-01 DIAGNOSIS — N390 Urinary tract infection, site not specified: Secondary | ICD-10-CM | POA: Diagnosis not present

## 2019-11-01 LAB — LEGIONELLA PNEUMOPHILA SEROGP 1 UR AG: L. pneumophila Serogp 1 Ur Ag: NEGATIVE

## 2019-11-01 MED ORDER — CIPROFLOXACIN HCL 500 MG PO TABS: 500.0000 mg | ORAL_TABLET | Freq: Two times a day (BID) | ORAL | 0 refills | Status: AC

## 2019-11-01 MED ORDER — CHLORTHALIDONE 25 MG PO TABS: 25.0000 mg | ORAL_TABLET | Freq: Every day | ORAL | 0 refills | Status: DC

## 2019-11-01 MED ORDER — SODIUM CHLORIDE 0.9 % IV SOLN
2.0000 g | Freq: Once | INTRAVENOUS | Status: AC
  Administered 2019-11-01: 2 g via INTRAVENOUS
  Filled 2019-11-01: qty 20

## 2019-11-01 MED ORDER — ACETAMINOPHEN 325 MG PO TABS: 650.0000 mg | ORAL_TABLET | Freq: Four times a day (QID) | ORAL | 0 refills | Status: DC | PRN

## 2019-11-01 NOTE — Discharge Summary (Signed)
Sydney Taylor, is a 53 y.o. female  DOB 10-11-1966  MRN [id number].  Admission date:  10/30/2019  Admitting Physician  Jani Gravel, MD  Discharge Date:  11/01/2019   Primary MD  Earney Mallet, MD  Recommendations for primary care physician for things to follow:   -1) please take ciprofloxacin antibiotic 500 mg twice a day as prescribed for presumed urinary tract infection 2) please call [phone] 11/02/2019 to discuss results of the urine culture which are currently not available at this time-may have to change antibiotic depending on the results of the urine culture 3) please decrease chlorthalidone to 25 mg daily from 50 mg daily 4) okay to stop losartan but continue lisinopril, but please do not take both of them together as that would be a duplication  Admission Diagnosis  Hypokalemia [E87.6] Urinary tract infection without hematuria, site unspecified [N39.0]   Discharge Diagnosis  Hypokalemia [E87.6] Urinary tract infection without hematuria, site unspecified [N39.0]    Principal Problem:   Acute lower UTI Active Problems:   Prediabetes   Hyperlipidemia   Hypokalemia   Tachycardia   Urinary tract infection without hematuria      Past Medical History:  Diagnosis Date   Anemia    Anxiety    Arrhythmia    Chronic headaches    Depression    Diabetes mellitus without complication (HCC)    Fibromyalgia    GERD (gastroesophageal reflux disease)    HLD (hyperlipidemia)    Hypertension    Insect bite    Prediabetes    Sleep apnea    Urinary incontinence     Past Surgical History:  Procedure Laterality Date   Joint fusion on foot Right    NASAL SEPTUM SURGERY         HPI  from the history and physical done on the day of admission:    - Sydney Taylor  is a 53 y.o. female, w hypertension, hyperlipidemia, Dm2, anxiety/depression, Fibromyalgia, Anemia, w  recent UTI about 1 week ago , apparently presents with c/o achiness, subjective fever, and generally not feeling well.  Pt denies dysuria, hematuria, flank pain, emesis.    In ED,  T 98.4, P 120, R 18, Bp 137/104  Pox 96% on RA WT 117.9kg  CTA chest IMPRESSION: No acute intrathoracic pathology. No CT evidence of central pulmonary artery embolus.  Wbc 11.3, Hgb 15.0, Plt 327 Na 137, K 3.4, Bun 13, Creatinine 1.08   Ast 27, Alt 36  Lipase 25  covid -19  Negative  Lactic acid 2.9-> 2.4  Urinalysis wbc 21-50  Rbc 0-5  Pt given rocephin 3m iv x1 , zithromax 5071miv x1  Pt will be admitted for acute uti, hypokalemia.      Hospital Course:      1) UTI--- failed Bactrim as outpatient, urine culture pending, received IV Rocephin here, patient requesting discharge home, discharged on p.o. Cipro, patient will call back in 24 hours to get results of urine culture, patient aware that antibiotics may have to be  adjusted based on urine culture and sensitivity report -She met sepsis criteria on admission, sepsis -Strep pneumo and Legionella antigens are negative -Blood and urine cultures NGTD  --Sepsis pathophysiology appears to have resolved   2) hypokalemia--suspect due to high-dose chlorthalidone use, decrease chlorthalidone to 25 mg daily from 50 mg  3)HTN-stable, resume losartan and metoprolol, decrease chlorthalidone to 25 mg daily from 50  4) depression/anxiety disorder--- stable, okay to resume Lexapro, Lamictal, Rexulti and Elavil  Discharge Condition: stable   Follow UP--PCP as advised for recheck  Diet and Activity recommendation:  As advised  Discharge Instructions    Discharge Instructions    Call MD for:  difficulty breathing, headache or visual disturbances   Complete by: As directed    Call MD for:  persistant dizziness or light-headedness   Complete by: As directed    Call MD for:  persistant nausea and vomiting   Complete by: As directed     Call MD for:  temperature >100.4   Complete by: As directed    Diet - low sodium heart healthy   Complete by: As directed    Discharge instructions   Complete by: As directed    1) please take ciprofloxacin antibiotic 500 mg twice a day as prescribed for presumed urinary tract infection 2) please call [phone] 11/02/2019 to discuss results of the urine culture which are currently not available at this time-may have to change antibiotic depending on the results of the urine culture 3) please decrease chlorthalidone to 25 mg daily from 50 mg daily 4) okay to stop losartan but continue lisinopril, but please do not take both of them together as that would be a duplication   Increase activity slowly   Complete by: As directed         Discharge Medications     Allergies as of 11/01/2019   No Known Allergies     Medication List    STOP taking these medications   losartan 25 MG tablet Commonly known as: COZAAR     TAKE these medications   acetaminophen 325 MG tablet Commonly known as: TYLENOL Take 2 tablets (650 mg total) by mouth every 6 (six) hours as needed for mild pain (or Fever >/= 101).   amitriptyline 25 MG tablet Commonly known as: ELAVIL Take 37.5 mg by mouth at bedtime.   Armodafinil 150 MG tablet Take 150 mg by mouth every morning.   chlorthalidone 25 MG tablet Commonly known as: HYGROTON Take 1 tablet (25 mg total) by mouth daily. What changed:   medication strength  how much to take   ciprofloxacin 500 MG tablet Commonly known as: Cipro Take 1 tablet (500 mg total) by mouth 2 (two) times daily for 5 days. Start taking on: November 02, 2019   escitalopram 20 MG tablet Commonly known as: LEXAPRO Take 1 tablet by mouth daily.   lamoTRIgine 100 MG tablet Commonly known as: LAMICTAL Take 100 mg by mouth daily.   lisinopril 20 MG tablet Commonly known as: ZESTRIL Take 20 mg by mouth daily.   metoprolol tartrate 25 MG tablet Commonly known as:  LOPRESSOR Take 25 mg by mouth 2 (two) times daily.   montelukast 10 MG tablet Commonly known as: SINGULAIR Take 1 tablet by mouth daily.   Na Sulfate-K Sulfate-Mg Sulf 17.5-3.13-1.6 GM/177ML Soln Take 1 kit by mouth once.   Natural Vitamin D-3 125 MCG (5000 UT) Tabs Generic drug: Cholecalciferol Take 1 tablet by mouth daily.   RABEprazole 20 MG tablet  Commonly known as: ACIPHEX Take 1 tablet by mouth daily.   Rexulti 2 MG Tabs tablet Generic drug: brexpiprazole Take 2 mg by mouth daily.       Major procedures and Radiology Reports - PLEASE review detailed and final reports for all details, in brief -   Ct Angio Chest Pe W And/or Wo Contrast  Result Date: 10/31/2019 CLINICAL DATA:  53 year old female with tachycardia and body aches. Abnormal chest x-ray. EXAM: CT ANGIOGRAPHY CHEST WITH CONTRAST TECHNIQUE: Multidetector CT imaging of the chest was performed using the standard protocol during bolus administration of intravenous contrast. Multiplanar CT image reconstructions and MIPs were obtained to evaluate the vascular anatomy. CONTRAST:  197m OMNIPAQUE IOHEXOL 350 MG/ML SOLN COMPARISON:  Chest radiograph dated 10/30/2019 FINDINGS: Cardiovascular: There is no cardiomegaly or pericardial effusion. The thoracic aorta is unremarkable. The origins of the great vessels of the aortic arch appear patent as visualized. Evaluation of the pulmonary arteries is somewhat limited due to suboptimal opacification and timing of the contrast. No definite large or central pulmonary artery embolus identified. Mediastinum/Nodes: There is no hilar or mediastinal adenopathy. The esophagus and the thyroid gland are grossly unremarkable. No mediastinal fluid collection. Lungs/Pleura: Minimal bibasilar atelectasis. No focal consolidation, pleural effusion, pneumothorax. The central airways are patent. Upper Abdomen: Fatty liver. The visualized upper abdomen is otherwise unremarkable. Musculoskeletal:  Degenerative changes of the spine. No acute osseous pathology. Review of the MIP images confirms the above findings. IMPRESSION: No acute intrathoracic pathology. No CT evidence of central pulmonary artery embolus. Electronically Signed   By: AAnner CreteM.D.   On: 10/31/2019 01:00   Dg Chest Portable 1 View  Result Date: 10/30/2019 CLINICAL DATA:  Viral illness. EXAM: PORTABLE CHEST 1 VIEW COMPARISON:  None. FINDINGS: There is mild opacity at the left base. The heart, hila, mediastinum, lungs, and pleura are otherwise unremarkable. IMPRESSION: Mild opacity at the left base may represent atelectasis versus subtle early infiltrate. Recommend clinical correlation and attention on follow-up. Electronically Signed   By: DDorise BullionIII M.D   On: 10/30/2019 21:11    Micro Results   Recent Results (from the past 240 hour(s))  SARS Coronavirus 2 by RT PCR [facility] Nasopharyngeal Nasopharyngeal Swab     Status: None   Collection Time: 10/30/19  9:51 PM   Specimen: Nasopharyngeal Swab  Result Value Ref Range Status   SARS Coronavirus 2 NEGATIVE NEGATIVE Final    Comment: (NOTE) If result is NEGATIVE SARS-CoV-2 target nucleic acids are NOT DETECTED. The SARS-CoV-2 RNA is generally detectable in upper and lower  respiratory specimens during the acute phase of infection. The lowest  concentration of SARS-CoV-2 viral copies this assay can detect is 250  copies / mL. A negative result does not preclude SARS-CoV-2 infection  and should not be used as the sole basis for treatment or other  patient management decisions.  A negative result may occur with  improper specimen collection / handling, submission of specimen other  than nasopharyngeal swab, presence of viral mutation(s) within the  areas targeted by this assay, and inadequate number of viral copies  (<250 copies / mL). A negative result must be combined with clinical  observations, patient  history, and epidemiological information. If result is POSITIVE SARS-CoV-2 target nucleic acids are DETECTED. The SARS-CoV-2 RNA is generally detectable in upper and lower  respiratory specimens dur ing the acute phase of infection.  Positive  results are indicative of active infection with  SARS-CoV-2.  Clinical  correlation with patient history and other diagnostic information is  necessary to determine patient infection status.  Positive results do  not rule out bacterial infection or co-infection with other viruses. If result is PRESUMPTIVE POSTIVE SARS-CoV-2 nucleic acids MAY BE PRESENT.   A presumptive positive result was obtained on the submitted specimen  and confirmed on repeat testing.  While 2019 novel coronavirus  (SARS-CoV-2) nucleic acids may be present in the submitted sample  additional confirmatory testing may be necessary for epidemiological  and / or clinical management purposes  to differentiate between  SARS-CoV-2 and other Sarbecovirus currently known to infect humans.  If clinically indicated additional testing with an alternate test  methodology [id number]) is advised. The SARS-CoV-2 RNA is generally  detectable in upper and lower respiratory sp ecimens during the acute  phase of infection. The expected result is Negative. Fact Sheet for Patients:  StrictlyIdeas.no Fact Sheet for Healthcare Providers: BankingDealers.co.za This test is not yet approved or cleared by the [place] FDA and has been authorized for detection and/or diagnosis of SARS-CoV-2 by FDA under an Emergency Use Authorization (EUA).  This EUA will remain in effect (meaning this test can be used) for the duration of the COVID-19 declaration under Section 564(b)(1) of the Act, 21 U.S.C. section 360bbb-3(b)(1), unless the authorization is terminated or revoked sooner. Performed at [facility], [address], [place], [place] [address]   Culture,  blood (routine x 2)     Status: None (Preliminary result)   Collection Time: 10/31/19  1:52 AM   Specimen: Right Antecubital; Blood  Result Value Ref Range Status   Specimen Description RIGHT ANTECUBITAL  Final   Special Requests NONE  Final   Culture   Final    NO GROWTH 1 DAY Performed at [facility], [address], [place], [place] [address]    Report Status PENDING  Incomplete  Culture, blood (routine x 2)     Status: None (Preliminary result)   Collection Time: 10/31/19  1:59 AM   Specimen: BLOOD RIGHT HAND  Result Value Ref Range Status   Specimen Description BLOOD RIGHT HAND  Final   Special Requests NONE  Final   Culture   Final    NO GROWTH 1 DAY Performed at [facility], [address] [place], [place] [address]    Report Status PENDING  Incomplete       Today   Subjective    Dela Sweeny today has no new concerns,  No fever  Or chills  No nausea, vomiting, diarrhea       -No dysuria or flank pain, patient requesting discharge home   Patient has been seen and examined prior to discharge   Objective   Blood pressure 105/65, pulse 65, temperature 98.7 F (37.1 C), temperature source Oral, resp. rate 17, height _0  (1.778 m), weight 124.3 kg, SpO2 97 %.   Intake/Output Summary (Last 24 hours) at 11/01/2019 1241 Last data filed at 10/31/2019 1837 Gross per 24 hour  Intake 1240 ml  Output --  Net 1240 ml    Exam Gen:- Awake Alert, no acute distress , obese HEENT:- [place].AT, No sclera icterus Neck-Supple Neck,No JVD,.  Lungs-  CTAB , good air movement bilaterally  CV- S1, S2 normal, regular Abd-  +ve B.Sounds, Abd Soft, No tenderness, no CVA area tenderness, patient does have increased truncal adiposity Extremity/Skin:- No  edema,   good pulses Psych-affect is appropriate, oriented x3 Neuro-no new focal deficits, no tremors  Data Review   CBC w Diff:  Lab Results  Component Value Date   WBC 11.8 (H) 10/31/2019   HGB 13.2 10/31/2019   HCT  41.7 10/31/2019   PLT 277 10/31/2019   LYMPHOPCT 22 10/30/2019   MONOPCT 8 10/30/2019   EOSPCT 3 10/30/2019   BASOPCT 1 10/30/2019    CMP:  Lab Results  Component Value Date   NA 135 10/31/2019   K 3.0 (L) 10/31/2019   CL 105 10/31/2019   CO2 21 (L) 10/31/2019   BUN 11 10/31/2019   CREATININE 1.06 (H) 10/31/2019   PROT 6.0 (L) 10/31/2019   ALBUMIN 3.4 (L) 10/31/2019   BILITOT 0.6 10/31/2019   ALKPHOS 70 10/31/2019   AST 22 10/31/2019   ALT 30 10/31/2019  .   Total Discharge time is about 33 minutes  Roxan Hockey M.D on 11/01/2019 at 12:41 PM  Go to [email or web address] -  for contact info  [facility] - Office  [phone]

## 2019-11-01 NOTE — Discharge Instructions (Signed)
1) please take ciprofloxacin antibiotic 500 mg twice a day as prescribed for presumed urinary tract infection 2) please call [phone] 11/02/2019 to discuss results of the urine culture which are currently not available at this time-may have to change antibiotic depending on the results of the urine culture 3) please decrease chlorthalidone to 25 mg daily from 50 mg daily 4) okay to stop losartan but continue lisinopril, but please do not take both of them together as that would be a duplication   Urinary Tract Infection, Adult A urinary tract infection (UTI) is an infection of any part of the urinary tract. The urinary tract includes:  The kidneys.  The ureters.  The bladder.  The urethra. These organs make, store, and get rid of pee (urine) in the body. What are the causes? This is caused by germs (bacteria) in your genital area. These germs grow and cause swelling (inflammation) of your urinary tract. What increases the risk? You are more likely to develop this condition if:  You have a small, thin tube (catheter) to drain pee.  You cannot control when you pee or poop (incontinence).  You are female, and: ? You use these methods to prevent pregnancy: ? A medicine that kills sperm (spermicide). ? A device that blocks sperm (diaphragm). ? You have low levels of a female hormone (estrogen). ? You are pregnant.  You have genes that add to your risk.  You are sexually active.  You take antibiotic medicines.  You have trouble peeing because of: ? A prostate that is bigger than normal, if you are female. ? A blockage in the part of your body that drains pee from the bladder (urethra). ? A kidney stone. ? A nerve condition that affects your bladder (neurogenic bladder). ? Not getting enough to drink. ? Not peeing often enough.  You have other conditions, such as: ? Diabetes. ? A weak disease-fighting system (immune system). ? Sickle cell disease. ? Gout. ? Injury of  the spine. What are the signs or symptoms? Symptoms of this condition include:  Needing to pee right away (urgently).  Peeing often.  Peeing small amounts often.  Pain or burning when peeing.  Blood in the pee.  Pee that smells bad or not like normal.  Trouble peeing.  Pee that is cloudy.  Fluid coming from the vagina, if you are female.  Pain in the belly or lower back. Other symptoms include:  Throwing up (vomiting).  No urge to eat.  Feeling mixed up (confused).  Being tired and grouchy (irritable).  A fever.  Watery poop (diarrhea). How is this treated? This condition may be treated with:  Antibiotic medicine.  Other medicines.  Drinking enough water. Follow these instructions at home:  Medicines  Take over-the-counter and prescription medicines only as told by your doctor.  If you were prescribed an antibiotic medicine, take it as told by your doctor. Do not stop taking it even if you start to feel better. General instructions  Make sure you: ? Pee until your bladder is empty. ? Do not hold pee for a long time. ? Empty your bladder after sex. ? Wipe from front to back after pooping if you are a female. Use each tissue one time when you wipe.  Drink enough fluid to keep your pee pale yellow.  Keep all follow-up visits as told by your doctor. This is important. Contact a doctor if:  You do not get better after 1-2 days.  Your symptoms go away and  then come back. Get help right away if:  You have very bad back pain.  You have very bad pain in your lower belly.  You have a fever.  You are sick to your stomach (nauseous).  You are throwing up. Summary  A urinary tract infection (UTI) is an infection of any part of the urinary tract.  This condition is caused by germs in your genital area.  There are many risk factors for a UTI. These include having a small, thin tube to drain pee and not being able to control when you pee or  poop.  Treatment includes antibiotic medicines for germs.  Drink enough fluid to keep your pee pale yellow. This information is not intended to replace advice given to you by your health care provider. Make sure you discuss any questions you have with your health care provider. Document Released: 06/03/2008 Document Revised: 12/03/2018 Document Reviewed: 06/25/2018 Elsevier Patient Education  2020 [employer].    Antibiotic Medicine, Adult  Antibiotic medicines treat infections caused by a type of germ called bacteria. They work by killing the bacteria that make you sick. When do I need to take antibiotics? You often need these medicines to treat bacterial infections, such as:  A urinary tract infection (UTI).  Strep throat.  Meningitis. This affects the spinal cord and brain.  A bad lung infection. You may start the medicines while your doctor waits for tests to come back. When the tests come back, your doctor may change or stop your medicine. When are antibiotics not needed? You do not need these medicines for most common illnesses, such as:  A cold.  The flu.  A sore throat. Antibiotics are not always needed for all infections caused by bacteria. Do not ask for these medicines, or take them, when they are not needed. What are the risks of taking antibiotics? Most antibiotics can cause an infection called Clostridioides difficile (C. diff). This causes watery poop (diarrhea). Let your doctor know right away if:  You have watery poop while taking an antibiotic.  You have watery poop after you stop taking an antibiotic. The illness can happen weeks after you stop the medicine. You also have a risk of getting an infection in the future that antibiotics cannot treat (antibiotic-resistant infection). This type of infection can be dangerous. What else should I know about taking antibiotics?   You need to take the entire prescription. ? Take the medicine for as long as told  by your doctor. ? Do not stop taking it even if you start to feel better.  Try not to miss any doses. If you miss a dose, call your doctor.  Birth control pills may not work. If you take birth control pills: ? Keep on taking them. ? Use a second form of birth control, such as a condom. Do this for as long as told by your doctor.  Ask your doctor: ? How long to wait in between doses. ? If you should take the medicine with food. ? If there is anything you should stay away from while taking the antibiotic, such as: ? Food. ? Drinks. ? Medicines. ? If there are any side effects you should watch for.  Only take the medicines that your doctor told you to take. Do not take medicines that were given to someone else.  Drink a large glass of water with the medicine.  Ask the pharmacist for a tool to measure the medicine, such as: ? A syringe. ? A  cup. ? A spoon.  Throw away any extra medicine. Contact a doctor if:  You get worse.  You have new joint pain or muscle aches after starting the medicine.  You have side effects from the medicine, such as: ? Stomach pain. ? Watery poop. ? Feeling sick to your stomach (nausea). Get help right away if:  You have signs of a very bad allergic reaction. If this happens, stop taking the medicine right away. Signs may include: ? Hives. These are raised, itchy, red bumps on the skin. ? Skin rash. ? Trouble breathing. ? Wheezing. ? Swelling. ? Feeling dizzy. ? Throwing up (vomiting).  Your pee (urine) is dark, or is the color of blood.  Your skin turns yellow.  You bruise easily.  You bleed easily.  You have very bad watery poop and cramps in your belly.  You have a very bad headache. Summary  Antibiotics are often used to treat infections caused by bacteria.  Only take these medicines when needed.  Let your doctor know if you have watery poop while taking an antibiotic.  You need to take the entire prescription. This  information is not intended to replace advice given to you by your health care provider. Make sure you discuss any questions you have with your health care provider. Document Released: 09/24/2008 Document Revised: 01/22/2019 Document Reviewed: 12/18/2016 Elsevier Patient Education  2020 [employer].

## 2019-11-02 LAB — URINE CULTURE: Culture: <10000 — AB

## 2019-11-05 LAB — CULTURE, BLOOD (ROUTINE X 2)
Culture: NO GROWTH
Culture: NO GROWTH

## 2019-11-07 ENCOUNTER — Ambulatory Visit (HOSPITAL_COMMUNITY)
Admission: EM | Admit: 2019-11-07 | Discharge: 2019-11-07 | Disposition: A | Discharge status: Home / Self Care | Payer: No Typology Code available for payment source | Attending: Family Medicine | Admitting: Family Medicine

## 2019-11-07 ENCOUNTER — Other Ambulatory Visit: Payer: Self-pay

## 2019-11-07 ENCOUNTER — Encounter (HOSPITAL_COMMUNITY): Payer: Self-pay

## 2019-11-07 DIAGNOSIS — M545 Low back pain, unspecified: Secondary | ICD-10-CM

## 2019-11-07 LAB — POCT URINALYSIS DIP (DEVICE)
Bilirubin Urine: NEGATIVE
Glucose, UA: NEGATIVE mg/dL
Hgb urine dipstick: NEGATIVE
Ketones, ur: NEGATIVE mg/dL
Leukocytes,Ua: NEGATIVE
Nitrite: NEGATIVE
Protein, ur: NEGATIVE mg/dL
Specific Gravity, Urine: 1.015 (ref 1.005–1.030)
Urobilinogen, UA: 0.2 mg/dL (ref 0.0–1.0)
pH: 7 (ref 5.0–8.0)

## 2019-11-07 MED ORDER — CYCLOBENZAPRINE HCL 10 MG PO TABS: 10.0000 mg | ORAL_TABLET | Freq: Two times a day (BID) | ORAL | 0 refills | Status: DC | PRN

## 2019-11-07 NOTE — ED Provider Notes (Signed)
[name]    CSN: [id number] Arrival date & time: 11/07/19  1219      History   Chief Complaint Chief Complaint  Patient presents with  . Back Pain  . Nausea    HPI Sydney Taylor is a 53 y.o. female.   Patient was seen at the emergency room in [place] about 1 week ago with generalized myalgias.  She had urine culture done and was given Cipro but subsequent culture showed growth less than 10,000 felt to be insignificant but she continued to take the Cipro.  Cyst since the Cipro started she has had nausea.  Back pain started yesterday and is confined to the right side.  She does have a history of left-sided disc disease.  Current pain is not radicular and is in the right costovertebral area  HPI  Past Medical History:  Diagnosis Date  . Anemia   . Anxiety   . Arrhythmia   . Chronic headaches   . Depression   . Diabetes mellitus without complication ([place])   . Fibromyalgia   . GERD (gastroesophageal reflux disease)   . HLD (hyperlipidemia)   . Hypertension   . Insect bite   . Prediabetes   . Sleep apnea   . Urinary incontinence     Patient Active Problem List   Diagnosis Date Noted  . Acute lower UTI 10/31/2019  . Hypokalemia 10/31/2019  . Tachycardia 10/31/2019  . Urinary tract infection without hematuria   . Prediabetes 10/30/2015  . Hyperlipidemia 10/30/2015  . Obesity due to excess calories 10/30/2015    Past Surgical History:  Procedure Laterality Date  . Joint fusion on foot Right   . NASAL SEPTUM SURGERY      OB History   No obstetric history on file.      Home Medications    Prior to Admission medications   Medication Sig Start Date End Date Taking? Authorizing Provider  acetaminophen (TYLENOL) 325 MG tablet Take 2 tablets (650 mg total) by mouth every 6 (six) hours as needed for mild pain (or Fever >/= 101). 11/01/19   Roxan Hockey, MD  amitriptyline (ELAVIL) 25 MG tablet Take 37.5 mg by mouth at bedtime. 10/06/19    [provider]  Armodafinil 150 MG tablet Take 150 mg by mouth every morning. 09/15/19   [provider]  chlorthalidone (HYGROTON) 25 MG tablet Take 1 tablet (25 mg total) by mouth daily. 11/01/19   Roxan Hockey, MD  ciprofloxacin (CIPRO) 500 MG tablet Take 1 tablet (500 mg total) by mouth 2 (two) times daily for 5 days. 11/02/19 11/07/19  Roxan Hockey, MD  escitalopram (LEXAPRO) 20 MG tablet Take 1 tablet by mouth daily. 09/14/15   [provider]  lamoTRIgine (LAMICTAL) 100 MG tablet Take 100 mg by mouth daily.    [provider]  lisinopril (PRINIVIL,ZESTRIL) 20 MG tablet Take 20 mg by mouth daily.    [provider]  metoprolol tartrate (LOPRESSOR) 25 MG tablet Take 25 mg by mouth 2 (two) times daily.    [provider]  montelukast (SINGULAIR) 10 MG tablet Take 1 tablet by mouth daily. 10/21/15   [provider]  Na Sulfate-K Sulfate-Mg Sulf SOLN Take 1 kit by mouth once. 11/07/15   Ladene Artist, MD  NATURAL VITAMIN D-3 125 MCG (5000 UT) TABS Take 1 tablet by mouth daily. 08/20/19   [provider]  RABEprazole (ACIPHEX) 20 MG tablet Take 1 tablet by mouth daily. 10/21/15   [provider]  REXULTI 2 MG TABS tablet Take 2 mg by mouth daily. 10/05/19   [provider]    Family History Family History  Problem Relation Age of Onset  . Hypertension Father   . Hyperlipidemia Father   . Diabetes Father   . Hypertension Mother   . Hyperlipidemia Mother   . Diabetes Mellitus II Mother   . Congestive Heart Failure Mother   . Hypertension Sister   . Diabetes Sister   . Hypertension Brother   . Diabetes Maternal Grandmother   . Lupus Paternal Grandmother     Social History Social History   Tobacco Use  . Smoking status: Former Smoker    Types: Cigarettes    Quit date: 12/30/1982    Years since quitting: 36.8  . Smokeless tobacco: Never Used  Substance Use Topics  . Alcohol use: Yes     Alcohol/week: 0.0 standard drinks    Comment: 1 per month  . Drug use: No     Allergies   Patient has no known allergies.   Review of Systems Review of Systems  Musculoskeletal: Positive for back pain.  All other systems reviewed and are negative.    Physical Exam Triage Vital Signs ED Triage Vitals  Enc Vitals Group     BP 11/07/19 1240 123/82     Pulse Rate 11/07/19 1240 (!) 125     Resp 11/07/19 1240 17     Temp 11/07/19 1240 98.8 F (37.1 C)     Temp Source 11/07/19 1240 Oral     SpO2 11/07/19 1240 97 %     Weight --      Height --      Head Circumference --      Peak Flow --      Pain Score 11/07/19 1238 4     Pain Loc --      Pain Edu? --      Excl. in [place]? --    No data found.  Updated Vital Signs BP 123/82 (BP Location: Left Arm)   Pulse (!) 125   Temp 98.8 F (37.1 C) (Oral)   Resp 17   LMP  (LMP Unknown)   SpO2 97%   Visual Acuity Right Eye Distance:   Left Eye Distance:   Bilateral Distance:    Right Eye Near:   Left Eye Near:    Bilateral Near:     Physical Exam Vitals signs and nursing note reviewed.  Constitutional:      Appearance: Normal appearance. She is obese.  Cardiovascular:     Rate and Rhythm: Normal rate and regular rhythm.  Pulmonary:     Effort: Pulmonary effort is normal.     Breath sounds: Normal breath sounds.  Musculoskeletal:     Comments: Back: Normal flexion.  There is some pain with lateral bending to the left Straight leg raising negative Deep tendon reflexes are symmetric  Neurological:     Mental Status: She is alert.      UC Treatments / Results  Labs (all labs ordered are listed, but only abnormal results are displayed) Labs Reviewed - No data to display  EKG   Radiology No results found.  Procedures Procedures (including critical care time)  Medications Ordered in UC Medications - No data to display  Initial Impression / Assessment and Plan / UC Course  I have reviewed the triage vital  signs and the nursing notes.  Pertinent labs & imaging results that were available during my care  of the patient were reviewed by me and considered in my medical decision making (see chart for details).     Low back pain, probably musculoskeletal Final Clinical Impressions(s) / UC Diagnoses   Final diagnoses:  None   Discharge Instructions   None    ED Prescriptions    None     PDMP not reviewed this encounter.   Wardell Honour, MD 11/07/19 1320

## 2019-11-07 NOTE — ED Triage Notes (Signed)
Pt presents to the UC with lower back pain and nausea x 2 days. Pt reports she was treated at the ED 1 week ago for UTI. Pt reports she was taking ciprofloxacin and started feeling, but 4 days later she started feeling bad and having the back pain and nausea. Pt report the last dose of ciprofloxacin was yesterday

## 2019-12-12 ENCOUNTER — Encounter (HOSPITAL_COMMUNITY): Payer: Self-pay | Admitting: *Deleted

## 2019-12-12 ENCOUNTER — Emergency Department (HOSPITAL_COMMUNITY): Payer: No Typology Code available for payment source

## 2019-12-12 ENCOUNTER — Emergency Department (HOSPITAL_COMMUNITY)
Admission: EM | Admit: 2019-12-12 | Discharge: 2019-12-12 | Disposition: A | Discharge status: Home / Self Care | Payer: No Typology Code available for payment source | Attending: Emergency Medicine | Admitting: Emergency Medicine

## 2019-12-12 ENCOUNTER — Other Ambulatory Visit: Payer: Self-pay

## 2019-12-12 DIAGNOSIS — R079 Chest pain, unspecified: Secondary | ICD-10-CM | POA: Diagnosis present

## 2019-12-12 DIAGNOSIS — R05 Cough: Secondary | ICD-10-CM | POA: Diagnosis not present

## 2019-12-12 DIAGNOSIS — R002 Palpitations: Secondary | ICD-10-CM | POA: Diagnosis not present

## 2019-12-12 DIAGNOSIS — R0602 Shortness of breath: Secondary | ICD-10-CM | POA: Diagnosis not present

## 2019-12-12 DIAGNOSIS — E119 Type 2 diabetes mellitus without complications: Secondary | ICD-10-CM | POA: Diagnosis not present

## 2019-12-12 DIAGNOSIS — Z87891 Personal history of nicotine dependence: Secondary | ICD-10-CM | POA: Insufficient documentation

## 2019-12-12 DIAGNOSIS — Z20828 Contact with and (suspected) exposure to other viral communicable diseases: Secondary | ICD-10-CM | POA: Diagnosis not present

## 2019-12-12 DIAGNOSIS — R000 Tachycardia, unspecified: Secondary | ICD-10-CM | POA: Diagnosis not present

## 2019-12-12 DIAGNOSIS — E876 Hypokalemia: Secondary | ICD-10-CM | POA: Diagnosis not present

## 2019-12-12 DIAGNOSIS — I10 Essential (primary) hypertension: Secondary | ICD-10-CM | POA: Diagnosis not present

## 2019-12-12 LAB — BASIC METABOLIC PANEL
Anion gap: 17 — ABNORMAL HIGH (ref 5–15)
BUN: 14 mg/dL (ref 6–20)
CO2: 25 mmol/L (ref 22–32)
Calcium: 9.1 mg/dL (ref 8.9–10.3)
Chloride: 97 mmol/L — ABNORMAL LOW (ref 98–111)
Creatinine, Ser: 0.95 mg/dL (ref 0.44–1.00)
GFR calc Af Amer: >60 mL/min (ref >60)
GFR calc non Af Amer: >60 mL/min (ref >60)
Glucose, Bld: 178 mg/dL — ABNORMAL HIGH (ref 70–99)
Potassium: 2.7 mmol/L — CL (ref 3.5–5.1)
Sodium: 139 mmol/L (ref 135–145)

## 2019-12-12 LAB — CBC
HCT: 42.1 % (ref 36.0–46.0)
Hemoglobin: 13.8 g/dL (ref 12.0–15.0)
MCH: 29.8 pg (ref 26.0–34.0)
MCHC: 32.8 g/dL (ref 30.0–36.0)
MCV: 90.9 fL (ref 80.0–100.0)
Platelets: 301 10*3/uL (ref 150–400)
RBC: 4.63 MIL/uL (ref 3.87–5.11)
RDW: 13.2 % (ref 11.5–15.5)
WBC: 9.9 10*3/uL (ref 4.0–10.5)
nRBC: 0 % (ref 0.0–0.2)

## 2019-12-12 LAB — SARS CORONAVIRUS 2 (TAT 6-24 HRS): SARS Coronavirus 2: NEGATIVE

## 2019-12-12 LAB — TROPONIN I (HIGH SENSITIVITY)
Troponin I (High Sensitivity): 3 ng/L (ref <18)
Troponin I (High Sensitivity): 3 ng/L (ref <18)

## 2019-12-12 LAB — D-DIMER, QUANTITATIVE: D-Dimer, Quant: 0.27 ug/mL-FEU (ref 0.00–0.50)

## 2019-12-12 MED ORDER — POTASSIUM CHLORIDE 10 MEQ/100ML IV SOLN
10.0000 meq | INTRAVENOUS | Status: AC
  Administered 2019-12-12 (×3): 10 meq via INTRAVENOUS
  Filled 2019-12-12 (×3): qty 100

## 2019-12-12 MED ORDER — MAGNESIUM OXIDE 400 (241.3 MG) MG PO TABS
800.0000 mg | ORAL_TABLET | Freq: Once | ORAL | Status: AC
  Administered 2019-12-12: 800 mg via ORAL
  Filled 2019-12-12: qty 2

## 2019-12-12 MED ORDER — POTASSIUM CHLORIDE CRYS ER 20 MEQ PO TBCR
40.0000 meq | EXTENDED_RELEASE_TABLET | Freq: Once | ORAL | Status: AC
  Administered 2019-12-12: 40 meq via ORAL
  Filled 2019-12-12: qty 2

## 2019-12-12 MED ORDER — SODIUM CHLORIDE 0.9 % IV BOLUS
1000.0000 mL | Freq: Once | INTRAVENOUS | Status: AC
  Administered 2019-12-12: 1000 mL via INTRAVENOUS

## 2019-12-12 MED ORDER — POTASSIUM CHLORIDE CRYS ER 20 MEQ PO TBCR: 20.0000 meq | EXTENDED_RELEASE_TABLET | Freq: Two times a day (BID) | ORAL | 0 refills | Status: DC

## 2019-12-12 NOTE — ED Notes (Signed)
Date and time results received: 12/12/19 06:29:46 (use smartphrase ".now" to insert current time)  Test: POTASSIUM Critical Value:2.7  Name of Provider Notified: Dr. Sedonia Small  Orders Received? Or Actions Taken?: Physician notified

## 2019-12-12 NOTE — ED Provider Notes (Addendum)
[facility] Emergency Department Provider Note MRN:  [id number]  Arrival date & time: 12/12/19     Chief Complaint   Chest Pain   History of Present Illness   Sydney Taylor is a 53 y.o. year-old female with a history of diabetes, GERD, fibromyalgia presenting to the ED with chief complaint of chest pain.  Patient woke up this morning feeling relatively normal.  Felt normal yesterday.  Began experiencing sudden onset palpitations, chest tightness, shortness of breath.  Recent cough.  Denies fever.  No abdominal pain, no numbness or weakness to the arms or legs.  No leg pain or swelling.  Symptoms mild to moderate, constant, no exacerbating or alleviating factors.  Review of Systems  A complete 10 system review of systems was obtained and all systems are negative except as noted in the HPI and PMH.   Patient's Health History    Past Medical History:  Diagnosis Date  . Anemia   . Anxiety   . Arrhythmia   . Chronic headaches   . Depression   . Diabetes mellitus without complication ([place])   . Fibromyalgia   . GERD (gastroesophageal reflux disease)   . HLD (hyperlipidemia)   . Hypertension   . Insect bite   . Prediabetes   . Sleep apnea   . Urinary incontinence     Past Surgical History:  Procedure Laterality Date  . Joint fusion on foot Right   . NASAL SEPTUM SURGERY      Family History  Problem Relation Age of Onset  . Hypertension Father   . Hyperlipidemia Father   . Diabetes Father   . Hypertension Mother   . Hyperlipidemia Mother   . Diabetes Mellitus II Mother   . Congestive Heart Failure Mother   . Hypertension Sister   . Diabetes Sister   . Hypertension Brother   . Diabetes Maternal Grandmother   . Lupus Paternal Grandmother     Social History   Socioeconomic History  . Marital status: Married    Spouse name: Not on file  . Number of children: 0  . Years of education: Not on file  . Highest education level: Not on file    Occupational History  . Occupation: research associate/drug development  Tobacco Use  . Smoking status: Former Smoker    Types: Cigarettes    Quit date: 12/30/1982    Years since quitting: 36.9  . Smokeless tobacco: Never Used  Substance and Sexual Activity  . Alcohol use: Yes    Alcohol/week: 0.0 standard drinks    Comment: 1 per month  . Drug use: No  . Sexual activity: Never  Other Topics Concern  . Not on file  Social History Narrative  . Not on file   Social Determinants of Health   Financial Resource Strain:   . Difficulty of Paying Living Expenses: Not on file  Food Insecurity:   . Worried About Charity fundraiser in the Last Year: Not on file  . Ran Out of Food in the Last Year: Not on file  Transportation Needs:   . Lack of Transportation (Medical): Not on file  . Lack of Transportation (Non-Medical): Not on file  Physical Activity:   . Days of Exercise per Week: Not on file  . Minutes of Exercise per Session: Not on file  Stress:   . Feeling of Stress : Not on file  Social Connections:   . Frequency of Communication with Friends and Family: Not on file  .  Frequency of Social Gatherings with Friends and Family: Not on file  . Attends Religious Services: Not on file  . Active Member of Clubs or Organizations: Not on file  . Attends Archivist Meetings: Not on file  . Marital Status: Not on file  Intimate Partner Violence:   . Fear of Current or Ex-Partner: Not on file  . Emotionally Abused: Not on file  . Physically Abused: Not on file  . Sexually Abused: Not on file     Physical Exam  Vital Signs and Nursing Notes reviewed Vitals:   12/12/19 0517 12/12/19 0600  BP: (!) 150/80 (!) 142/71  Pulse: (!) 122 (!) 110  Resp: (!) 23 13  Temp: 98.6 F (37 C)   SpO2: 93% 96%    CONSTITUTIONAL: Well-appearing, NAD NEURO:  Alert and oriented x 3, no focal deficits EYES:  eyes equal and reactive ENT/NECK:  no LAD, no JVD CARDIO: Tachycardic rate,  well-perfused, normal S1 and S2 PULM:  CTAB no wheezing or rhonchi GI/GU:  normal bowel sounds, non-distended, non-tender MSK/SPINE:  No gross deformities, no edema SKIN:  no rash, atraumatic PSYCH:  Appropriate speech and behavior  Diagnostic and Interventional Summary    EKG Interpretation  Date/Time:  Sunday December 12 2019 05:16:49 EST Ventricular Rate:  120 PR Interval:    QRS Duration: 86 QT Interval:  311 QTC Calculation: 440 R Axis:   77 Text Interpretation: Sinus tachycardia Artifact No significant change was found Confirmed by Gerlene Fee [phone]) on 12/12/2019 5:51:25 AM      Labs Reviewed  BASIC METABOLIC PANEL - Abnormal; Notable for the following components:      Result Value   Potassium 2.7 (*)    Chloride 97 (*)    Glucose, Bld 178 (*)    Anion gap 17 (*)    All other components within normal limits  SARS CORONAVIRUS 2 (TAT 6-24 HRS)  CBC  D-DIMER, QUANTITATIVE (NOT AT [facility])  POC SARS CORONAVIRUS 2 AG -  ED  TROPONIN I (HIGH SENSITIVITY)    XR Chest Single View    (Results Pending)    Medications  potassium chloride 10 mEq in 100 mL IVPB (has no administration in time range)  sodium chloride 0.9 % bolus 1,000 mL (1,000 mLs Intravenous New Bag/Given 12/12/19 0615)  potassium chloride SA (KLOR-CON) CR tablet 40 mEq (40 mEq Oral Given 12/12/19 0639)  magnesium oxide (MAG-OX) tablet 800 mg (800 mg Oral Given 12/12/19 [id number])     Procedures  /  Critical Care .Critical Care Performed by: Maudie Flakes, MD Authorized by: Maudie Flakes, MD   Critical care provider statement:    Critical care time (minutes):  33   Critical care was necessary to treat or prevent imminent or life-threatening deterioration of the following conditions:  Metabolic crisis (hypokalemia)   Critical care was time spent personally by me on the following activities:  Discussions with consultants, evaluation of patient's response to treatment, examination of patient, ordering and  performing treatments and interventions, ordering and review of laboratory studies, ordering and review of radiographic studies, pulse oximetry, re-evaluation of patient's condition, obtaining history from patient or surrogate and review of old charts    ED Course and Medical Decision Making  I have reviewed the triage vital signs and the nursing notes.  Pertinent labs & imaging results that were available during my care of the patient were reviewed by me and considered in my medical decision making (see below for details).  Considering pulmonary embolism, ACS, pneumonia, pneumothorax.  Patient was admitted to the hospital for hypokalemia in early November, raising her risk of VTE.  She arrives with sinus tachycardia in the 120s.  EKG without ischemic changes.  She has a primary care doctor that has been trying to manage new tachycardia for the past few weeks.  Awaiting labs, troponin, D-dimer, x-ray.  Troponin is negative, D-dimer is negative, labs remarkable for potassium of 2.7.  Will replete here in the emergency department.  Patient prefers to be discharged.  Upon chart review it appears that patient was hypokalemic during her last admission.  It is suspected that she is wasting potassium in her urine related to her chlorthalidone medication.  This medication was decreased from 50 mg to 25 mg daily.  She may need to be switched to a potassium sparing diuretic.  Will defer this decision to her primary care doctor.  Plan is to replete potassium and magnesium, recheck BMP, and hopefully discharge if there is adequate improvement.  Signed out to oncoming provider at shift change.  Barth Kirks. Sedonia Small, [place] [email or web address]  Final Clinical Impressions(s) / ED Diagnoses     ICD-10-CM   1. Hypokalemia  E87.6   2. SOB (shortness of breath)  R06.02 XR Chest Single View    XR Chest Single View  3. Palpitations  R00.2   4. Tachycardia   R00.0     ED Discharge Orders         Ordered    potassium chloride SA (KLOR-CON) 20 MEQ tablet  2 times daily     12/12/19 [id number]           Discharge Instructions Discussed with and Provided to Patient:     Discharge Instructions     You were evaluated in the Emergency Department and after careful evaluation, we did not find any emergent condition requiring admission or further testing in the hospital.  Your exam/testing today is overall reassuring.  Your symptoms seem to be due to low potassium levels.  Please discuss your medications with your primary care doctor to see if any changes need to be made to your chlorthalidone medication.  Please return to the Emergency Department if you experience any worsening of your condition.  We encourage you to follow up with a primary care provider.  Thank you for allowing [place] to be a part of your care.       Maudie Flakes, MD 12/12/19 [id number]    Maudie Flakes, MD 12/21/19 2040

## 2019-12-12 NOTE — Discharge Instructions (Addendum)
You were evaluated in the Emergency Department and after careful evaluation, we did not find any emergent condition requiring admission or further testing in the hospital.  Your exam/testing today is overall reassuring.  Your symptoms seem to be due to low potassium levels.  Please discuss your medications with your primary care doctor to see if any changes need to be made to your chlorthalidone medication.  Please return to the Emergency Department if you experience any worsening of your condition.  We encourage you to follow up with a primary care provider.  Thank you for allowing [place] to be a part of your care.

## 2019-12-12 NOTE — ED Notes (Signed)
POC COVID test not preformed. Patient had a send out COVID test preformed. Dr. Sedonia Small notified.

## 2019-12-12 NOTE — ED Triage Notes (Signed)
Pt states that she woke up feeling like her heart was beating fast tonight, also has pain described as "twinges" located in the middle of the chest area along with sob for the past few weeks,

## 2019-12-27 ENCOUNTER — Ambulatory Visit (INDEPENDENT_AMBULATORY_CARE_PROVIDER_SITE_OTHER): Payer: No Typology Code available for payment source | Admitting: Internal Medicine

## 2019-12-28 ENCOUNTER — Other Ambulatory Visit: Payer: Self-pay

## 2019-12-28 ENCOUNTER — Ambulatory Visit (INDEPENDENT_AMBULATORY_CARE_PROVIDER_SITE_OTHER): Payer: No Typology Code available for payment source | Admitting: Internal Medicine

## 2019-12-28 ENCOUNTER — Encounter (INDEPENDENT_AMBULATORY_CARE_PROVIDER_SITE_OTHER): Payer: Self-pay | Admitting: Internal Medicine

## 2019-12-28 VITALS — BP 134/92 | HR 97 | Temp 97.3°F | Resp 16 | Ht 70.0 in | Wt 272.0 lb

## 2019-12-28 DIAGNOSIS — E1100 Type 2 diabetes mellitus with hyperosmolarity without nonketotic hyperglycemic-hyperosmolar coma (NKHHC): Secondary | ICD-10-CM

## 2019-12-28 DIAGNOSIS — G4733 Obstructive sleep apnea (adult) (pediatric): Secondary | ICD-10-CM | POA: Insufficient documentation

## 2019-12-28 DIAGNOSIS — E2839 Other primary ovarian failure: Secondary | ICD-10-CM

## 2019-12-28 DIAGNOSIS — F319 Bipolar disorder, unspecified: Secondary | ICD-10-CM | POA: Diagnosis not present

## 2019-12-28 DIAGNOSIS — R5381 Other malaise: Secondary | ICD-10-CM

## 2019-12-28 DIAGNOSIS — G4730 Sleep apnea, unspecified: Secondary | ICD-10-CM

## 2019-12-28 DIAGNOSIS — R000 Tachycardia, unspecified: Secondary | ICD-10-CM | POA: Diagnosis not present

## 2019-12-28 DIAGNOSIS — E876 Hypokalemia: Secondary | ICD-10-CM | POA: Diagnosis not present

## 2019-12-28 DIAGNOSIS — R5383 Other fatigue: Secondary | ICD-10-CM

## 2019-12-28 DIAGNOSIS — E559 Vitamin D deficiency, unspecified: Secondary | ICD-10-CM

## 2019-12-28 DIAGNOSIS — E782 Mixed hyperlipidemia: Secondary | ICD-10-CM | POA: Diagnosis not present

## 2019-12-28 HISTORY — DX: Bipolar disorder, unspecified: F31.9

## 2019-12-28 NOTE — Progress Notes (Signed)
Metrics: Intervention Frequency ACO  Documented Smoking Status Yearly  Screened one or more times in 24 months  Cessation Counseling or  Active cessation medication Past 24 months  Past 24 months   Guideline developer: UpToDate (See UpToDate for funding source) Date Released: 2014       Wellness Office Visit  Subjective:  Patient ID: Sydney Taylor, female    DOB: August 10, 1966  Age: 53 y.o. MRN: [id number]  CC: This 53 year old lady comes to our practice as a new patient to be established and seek second opinion on her overall health.  She has a physician in [place]. HPI  She has several concerns.  She is concerned about dizziness which she describes as the world spinning and usually occurs when she is driving a vehicle.  It does not seem to be related to head movement according to her.  She has never lost consciousness with the dizziness.  The dizziness has been going on for several months apparently. She also is concerned about palpitations which are fast and regular and this has also been going on for several months.  The palpitations usually occur on exercise/exertion.  She denies any chest pain with them or any dizziness with them She describes quite severe fatigue for several months. She also describes weight gain over the last several years.  She tells me that when she was in her 72s she was around 150 pounds. She describes poor focus and concentration.  She describes excessive sleeping, usually 9 to 10 hours. She has sleep apnea and has a CPAP machine and she finds that when she does not use it, she feels more fatigued and has headaches. She apparently has been diagnosed with diabetes and her last hemoglobin A1c was 6.7%.  Her physician in [place], [place] is recommended diet first before any medications are to be recommended. On closer questioning, she has been in the menopause for the last couple of years and did used to get hot flashes but not so much in the last year.   She describes that her libido is significantly decreased in the last 1 year. She has been diagnosed with bipolar depression several years ago and normally did see a counselor in [place] but apparently has not been seen by one recently.  She has never been suicidal with her depression and has never had any suicide attempts. Past Medical History:  Diagnosis Date  . Anemia   . Anxiety   . Arrhythmia   . Bipolar depression ([place]) 12/28/2019  . Chronic headaches   . Depression   . Diabetes mellitus without complication ([place])   . Fibromyalgia   . GERD (gastroesophageal reflux disease)   . HLD (hyperlipidemia)   . Hypertension   . Insect bite   . Prediabetes   . Sleep apnea   . Urinary incontinence       Family History  Problem Relation Age of Onset  . Hypertension Father   . Hyperlipidemia Father   . Diabetes Father   . Hypertension Mother   . Hyperlipidemia Mother   . Diabetes Mellitus II Mother   . Congestive Heart Failure Mother   . Hypertension Sister   . Diabetes Sister   . Hypertension Brother   . Diabetes Maternal Grandmother   . Lupus Paternal Grandmother     Social History   Social History Narrative   Married for 3 years.Lives with husband.Bachelors in Education officer, museum.   Social History   Tobacco Use  . Smoking status: Former Smoker  Types: Cigarettes    Quit date: 12/30/1982    Years since quitting: 37.0  . Smokeless tobacco: Never Used  Substance Use Topics  . Alcohol use: Not Currently    Alcohol/week: 0.0 standard drinks    Current Meds  Medication Sig  . amitriptyline (ELAVIL) 25 MG tablet Take 37.5 mg by mouth at bedtime.  . chlorthalidone (HYGROTON) 25 MG tablet Take 1 tablet (25 mg total) by mouth daily. (Patient taking differently: Take 50 mg by mouth daily. )  . ergocalciferol (VITAMIN D2) 1.25 MG (50000 UT) capsule Take 50,000 Units by mouth once a week.  . lamoTRIgine (LAMICTAL) 100 MG tablet Take 100 mg by mouth 2 (two) times daily.   .  metoprolol tartrate (LOPRESSOR) 25 MG tablet Take 25 mg by mouth 2 (two) times daily.  . potassium chloride SA (KLOR-CON) 20 MEQ tablet Take 1 tablet (20 mEq total) by mouth 2 (two) times daily for 3 days. (Patient taking differently: Take 20 mEq by mouth daily. )  . REXULTI 2 MG TABS tablet Take 2 mg by mouth daily.  . [DISCONTINUED] acetaminophen (TYLENOL) 325 MG tablet Take 2 tablets (650 mg total) by mouth every 6 (six) hours as needed for mild pain (or Fever >/= 101).  . [DISCONTINUED] Armodafinil 150 MG tablet Take 150 mg by mouth every morning.  . [DISCONTINUED] montelukast (SINGULAIR) 10 MG tablet Take 1 tablet by mouth daily.  . [DISCONTINUED] Na Sulfate-K Sulfate-Mg Sulf SOLN Take 1 kit by mouth once.  . [DISCONTINUED] NATURAL VITAMIN D-3 125 MCG (5000 UT) TABS Take 1 tablet by mouth daily.      Objective:   Today's Vitals: BP (!) 134/92   Pulse 97   Temp (!) 97.3 F (36.3 C) (Temporal)   Resp 16   Ht _0  (1.778 m)   Wt 272 lb (123.4 kg)   LMP  (LMP Unknown)   SpO2 99%   BMI 39.03 kg/m  Vitals with BMI 12/28/2019 12/12/2019 12/12/2019  Height _1  - -  Weight 272 lbs - -  BMI 28.41 - -  Systolic 324 401 -  Diastolic 92 70 -  Pulse 97 - 108     Physical Exam  She is obese and almost morbidly obese.  She does have a increased heart rate at rest.  She is alert and orientated without any obvious focal neurological signs.     Assessment   1. Bipolar depression ([place])   2. Mixed hyperlipidemia   3. Tachycardia   4. Hypokalemia   5. Sleep apnea, unspecified type   6. Type 2 diabetes mellitus with hyperosmolarity without coma, without long-term current use of insulin ([place])   7. Morbid obesity ([place])   8. Primary ovarian failure   9. Malaise and fatigue   10. Vitamin D deficiency disease       Tests ordered Orders Placed This Encounter  Procedures  . CBC  . CMP with eGFR([facility])  . Hemoglobin A1c  . Lipid Panel  . T3, Free  . T4  . TSH  .  Testos,Total,Free and SHBG (Female)  . Vitamin D, 25-hydroxy  . Ambulatory referral to Cardiology     Plan: 1. I will refer her to cardiology for her tachycardia. 2. I will order blood work and she will get this done in the next week or so. 3. I will see her in the next several weeks to discuss all the blood work and further recommendations.  In the meantime, I have encouraged her  to drink plenty of water every day. 4. Today I spent 1 hour with this patient face-to-face, more than 50% of the time was involved in discussing all her symptoms, reviewing records that were in our system, and discussing the philosophy of the practice.   No orders of the defined types were placed in this encounter.   Doree Albee, MD

## 2020-01-04 NOTE — Progress Notes (Deleted)
CARDIOLOGY CONSULT NOTE       Patient ID: [facility] MRN: [id number] DOB/AGE: 55/09/1966 55 y.o.  Admit date: (Not on file) Referring Physician: Anastasio Champion Primary Physician: Earney Mallet, MD Primary Cardiologist: New Reason for Consultation: Tachycardia  Active Problems:   * No active hospital problems. *   HPI:  54 y.o. referred by Dr Anastasio Champion for tachycardia She has had several months of dizziness while driving. "The world is spinning" no sycnope or accidents Also with palpitations for similar time period. Pulse feels fast but regular Not associated with her dizziness No associated dyspnea or chest pain OSA using CPAP significant fatigue and weight gain last year Carries diagnosis of bipolar depression not seeing a counselor recently Labs ordered including TSH/Hct but pending ECG done 12/13/19 ? Flutter rate of 120 with low voltage   Seen in ED with chest pain 12/12/19 associated with palpitations dyspnea ECG read as sinus tachycardia K low again On diuretic Repleted R/O CXR chronically elevated right hemi diaphragm   ***  ROS All other systems reviewed and negative except as noted above  Past Medical History:  Diagnosis Date  . Anemia   . Anxiety   . Arrhythmia   . Bipolar depression ([place]) 12/28/2019  . Chronic headaches   . Depression   . Diabetes mellitus without complication ([place])   . Fibromyalgia   . GERD (gastroesophageal reflux disease)   . HLD (hyperlipidemia)   . Hypertension   . Insect bite   . Prediabetes   . Sleep apnea   . Urinary incontinence     Family History  Problem Relation Age of Onset  . Hypertension Father   . Hyperlipidemia Father   . Diabetes Father   . Hypertension Mother   . Hyperlipidemia Mother   . Diabetes Mellitus II Mother   . Congestive Heart Failure Mother   . Hypertension Sister   . Diabetes Sister   . Hypertension Brother   . Diabetes Maternal Grandmother   . Lupus Paternal Grandmother     Social History    Socioeconomic History  . Marital status: Married    Spouse name: Not on file  . Number of children: 0  . Years of education: Not on file  . Highest education level: Not on file  Occupational History  . Occupation: research associate/drug development  Tobacco Use  . Smoking status: Former Smoker    Types: Cigarettes    Quit date: 12/30/1982    Years since quitting: 37.0  . Smokeless tobacco: Never Used  Substance and Sexual Activity  . Alcohol use: Not Currently    Alcohol/week: 0.0 standard drinks  . Drug use: No  . Sexual activity: Never  Other Topics Concern  . Not on file  Social History Narrative   Married for 3 years.Lives with husband.Bachelors in Education officer, museum.   Social Determinants of Health   Financial Resource Strain:   . Difficulty of Paying Living Expenses: Not on file  Food Insecurity:   . Worried About Charity fundraiser in the Last Year: Not on file  . Ran Out of Food in the Last Year: Not on file  Transportation Needs:   . Lack of Transportation (Medical): Not on file  . Lack of Transportation (Non-Medical): Not on file  Physical Activity:   . Days of Exercise per Week: Not on file  . Minutes of Exercise per Session: Not on file  Stress:   . Feeling of Stress : Not on file  Social Connections:   .  Frequency of Communication with Friends and Family: Not on file  . Frequency of Social Gatherings with Friends and Family: Not on file  . Attends Religious Services: Not on file  . Active Member of Clubs or Organizations: Not on file  . Attends Archivist Meetings: Not on file  . Marital Status: Not on file  Intimate Partner Violence:   . Fear of Current or Ex-Partner: Not on file  . Emotionally Abused: Not on file  . Physically Abused: Not on file  . Sexually Abused: Not on file    Past Surgical History:  Procedure Laterality Date  . Joint fusion on foot Right   . NASAL SEPTUM SURGERY          Physical Exam: There were no vitals taken for  this visit.   Affect appropriate Healthy:  appears stated age 54: normal Neck supple with no adenopathy JVP normal no bruits no thyromegaly Lungs clear with no wheezing and good diaphragmatic motion Heart:  S1/S2 no murmur, no rub, gallop or click PMI normal Abdomen: benighn, BS positve, no tenderness, no AAA no bruit.  No HSM or HJR Distal pulses intact with no bruits No edema Neuro non-focal Skin warm and dry No muscular weakness   Labs:   Lab Results  Component Value Date   WBC 9.9 12/12/2019   HGB 13.8 12/12/2019   HCT 42.1 12/12/2019   MCV 90.9 12/12/2019   PLT 301 12/12/2019   No results for input(s): NA, K, CL, CO2, BUN, CREATININE, CALCIUM, PROT, BILITOT, ALKPHOS, ALT, AST, GLUCOSE in the last 168 hours.  Invalid input(s): LABALBU No results found for: CKTOTAL, CKMB, CKMBINDEX, TROPONINI  Lab Results  Component Value Date   CHOL 205 (A) 08/26/2016   Lab Results  Component Value Date   HDL 54 08/26/2016   Lab Results  Component Value Date   LDLCALC 122 08/26/2016   Lab Results  Component Value Date   TRIG 145 08/26/2016   No results found for: CHOLHDL No results found for: LDLDIRECT    Radiology: XR Chest Single View  Result Date: 12/12/2019 CLINICAL DATA:  Per triage note: Pt states that she woke up feeling like her heart was beating fast tonight, also has pain described as "twinges" located in the middle of the chest area along with sob for the past few weeks, EXAM: PORTABLE CHEST 1 VIEW COMPARISON:  Radiograph 10/30/2019, CT 10/31/2019 FINDINGS: Stable cardiac silhouette. Elevation of the RIGHT hemidiaphragm is chronic. There is mild bibasilar atelectasis. No effusion, infiltrate, or pneumothorax. IMPRESSION: 1. No acute cardiopulmonary process. 2. Chronic elevation of the RIGHT hemidiaphragm. 3. Mild bibasilar atelectasis. Electronically Signed   By: Suzy Bouchard M.D.   On: 12/12/2019 07:05    EKG: see HPI ***   ASSESSMENT AND PLAN:    1. Tachycardia:  *** 2. DM:  Discussed low carb diet.  Target hemoglobin A1c is 6.5 or less.  Continue current medications. 3. Bipolar:  On elavil, lamictal and rexulti f/u with behavioral health Rexulti is a dopaminergic agonist *** 4. Hypokalemia:  Recurrent due to [place] use ***  Signed: Jenkins Rouge 01/04/2020, 9:43 AM

## 2020-01-07 ENCOUNTER — Ambulatory Visit: Payer: No Typology Code available for payment source | Admitting: Cardiovascular Disease

## 2020-01-14 ENCOUNTER — Encounter: Payer: Self-pay | Admitting: Internal Medicine

## 2020-01-14 ENCOUNTER — Ambulatory Visit: Payer: No Typology Code available for payment source | Admitting: Internal Medicine

## 2020-01-14 ENCOUNTER — Other Ambulatory Visit: Payer: Self-pay

## 2020-01-14 VITALS — BP 136/74 | HR 82 | Temp 98.0°F | Ht 70.0 in | Wt 276.0 lb

## 2020-01-14 DIAGNOSIS — I10 Essential (primary) hypertension: Secondary | ICD-10-CM | POA: Diagnosis not present

## 2020-01-14 DIAGNOSIS — R002 Palpitations: Secondary | ICD-10-CM

## 2020-01-14 NOTE — Patient Instructions (Signed)
Medication Instructions:  Your physician recommends that you continue on your current medications as directed. Please refer to the Current Medication list given to you today.  *If you need a refill on your cardiac medications before your next appointment, please call your pharmacy*  Lab Work: NONE  If you have labs (blood work) drawn today and your tests are completely normal, you will receive your results only by: Marland Kitchen MyChart Message (if you have MyChart) OR . A paper copy in the mail If you have any lab test that is abnormal or we need to change your treatment, we will call you to review the results.  Testing/Procedures: NONE   Follow-Up: At [facility], you and your health needs are our priority.  As part of our continuing mission to provide you with exceptional heart care, we have created designated Provider Care Teams.  These Care Teams include your primary Cardiologist (physician) and Advanced Practice Providers (APPs -  Physician Assistants and Nurse Practitioners) who all work together to provide you with the care you need, when you need it.  Your next appointment:    NO follow up needed   The format for your next appointment:   Either In Person or Virtual  Provider:   Dorris Carnes, MD  Other Instructions Thank you for choosing [facility]!

## 2020-01-14 NOTE — Progress Notes (Signed)
Cardiology Office Note   Date:  01/14/2020   ID:  Sydney Taylor, DOB 15-Nov-1966, MRN [id number]  PCP:  Doree Albee, MD  Cardiologist:   Dorris Carnes, MD   Pt referred by Dr Anastasio Champion for tachycardia       History of Present Illness: Sydney Taylor is a 54 y.o. female with a history OSA, DM, bipolar disorder   SHe complains  of dizziness   Like world spinning.   Usually with driving  No hx of syncope   Pt also has a history of palpitations   Occurred for several months  Palpitations usually occur with exertion.  Feel like normal heart beats except harder   SHe denies racing of heart while at rest   No dizziness with this   No syncope     Pt notes  occsaional CP She describes pains lIke electric shocks  SPells last seconds            Current Meds  Medication Sig  . amitriptyline (ELAVIL) 25 MG tablet Take 37.5 mg by mouth at bedtime.  . chlorthalidone (HYGROTON) 25 MG tablet Take 1 tablet (25 mg total) by mouth daily. (Patient taking differently: Take 50 mg by mouth daily. )  . ergocalciferol (VITAMIN D2) 1.25 MG (50000 UT) capsule Take 50,000 Units by mouth once a week.  . lamoTRIgine (LAMICTAL) 100 MG tablet Take 100 mg by mouth 2 (two) times daily.   Marland Kitchen losartan (COZAAR) 25 MG tablet Take 25 mg by mouth daily.  . metoprolol tartrate (LOPRESSOR) 25 MG tablet Take 25 mg by mouth 2 (two) times daily.  . potassium chloride SA (KLOR-CON) 20 MEQ tablet Take 1 tablet (20 mEq total) by mouth 2 (two) times daily for 3 days. (Patient taking differently: Take 20 mEq by mouth daily. )  . REXULTI 2 MG TABS tablet Take 2 mg by mouth daily.     Allergies:   Patient has no known allergies.   Past Medical History:  Diagnosis Date  . Anemia   . Anxiety   . Arrhythmia   . Bipolar depression ([place]) 12/28/2019  . Chronic headaches   . Depression   . Diabetes mellitus without complication ([place])   . Fibromyalgia   . GERD (gastroesophageal reflux disease)   . HLD (hyperlipidemia)   .  Hypertension   . Insect bite   . Prediabetes   . Sleep apnea   . Urinary incontinence     Past Surgical History:  Procedure Laterality Date  . Joint fusion on foot Right   . NASAL SEPTUM SURGERY       Social History:  The patient  reports that she quit smoking about 37 years ago. Her smoking use included cigarettes. She has never used smokeless tobacco. She reports previous alcohol use. She reports that she does not use drugs.   Family History:  The patient's family history includes Congestive Heart Failure in her mother; Diabetes in her father, maternal grandmother, and sister; Diabetes Mellitus II in her mother; Hyperlipidemia in her father and mother; Hypertension in her brother, father, mother, and sister; Lupus in her paternal grandmother.    ROS:  Please see the history of present illness. All other systems are reviewed and  Negative to the above problem except as noted.    PHYSICAL EXAM: VS:  BP 136/74   Pulse 82   Temp 98 F (36.7 C)   Ht 5\' 10"  (1.778 m)   Wt 276 lb (125.2 kg)   LMP  (  LMP Unknown)   SpO2 98%   BMI 39.60 kg/m   GEN: Morbidly obese 54 yo  in no acute distress  HEENT: normal  Neck: no JVD, carotid bruits Cardiac: RRR; no murmurs, rubs, or gallops,no edema  Respiratory:  clear to auscultation bilaterally, normal work of breathing GI: soft, nontender, nondistended, + BS  No hepatomegaly  MS: no deformity Moving all extremities   Skin: warm and dry, no rash Neuro:  Strength and sensation are intact Psych: euthymic mood, full affect   EKG:  EKG is not ordered today.  IN 12/13/19:  ST   104 bpm   Sl ST depression in inferolateral leads    Lipid Panel    Component Value Date/Time   CHOL 205 (A) 08/26/2016 0000   TRIG 145 08/26/2016 0000   HDL 54 08/26/2016 0000   LDLCALC 122 08/26/2016 0000      Wt Readings from Last 3 Encounters:  01/14/20 276 lb (125.2 kg)  12/28/19 272 lb (123.4 kg)  12/12/19 270 lb (122.5 kg)      ASSESSMENT AND  PLAN:  1  Tachycardia   Spells do not sound like arrhythmia but sinus tachycardia.   EKGs from Dr Lanice Shirts office and also ED in past Show Sinus tachyardia   No Afib   Or other arrhythmia Today her HR is normal    I think her heart rate is in response to hydration status, physiologic stressors.    I do not think inapprop ST. Note she is on 50 chlorathalidone which may be too much some days   I have recomm that she try to stay hydrated   Urine should be dilute    I will review labs (CBC and TSH) which could exacerbate  I also incouraged her to increase walking, activity   Increase vagal tone N 2  HTN   BP is OK   Keep on same regimen     3   OSA  Keep on CPAP  4   Morbid obesity   Needs to lose wt.    Current medicines are reviewed at length with the patient today.  The patient does not have concerns regarding medicines.  Signed, Dorris Carnes, MD  01/14/2020 11:32 AM    [address], [place], [place]  [address] Phone: [phone]; Fax: [phone]

## 2020-01-20 ENCOUNTER — Ambulatory Visit (INDEPENDENT_AMBULATORY_CARE_PROVIDER_SITE_OTHER): Payer: No Typology Code available for payment source | Admitting: Internal Medicine

## 2020-01-21 ENCOUNTER — Ambulatory Visit: Payer: No Typology Code available for payment source | Admitting: Cardiology

## 2020-01-25 LAB — CBC
HCT: 42.9 % (ref 35.0–45.0)
Hemoglobin: 14.5 g/dL (ref 11.7–15.5)
MCH: 30.1 pg (ref 27.0–33.0)
MCHC: 33.8 g/dL (ref 32.0–36.0)
MCV: 89.2 fL (ref 80.0–100.0)
MPV: 10.2 fL (ref 7.5–12.5)
Platelets: 312 10*3/uL (ref 140–400)
RBC: 4.81 10*6/uL (ref 3.80–5.10)
RDW: 13.2 % (ref 11.0–15.0)
WBC: 9.7 10*3/uL (ref 3.8–10.8)

## 2020-01-25 LAB — COMPLETE METABOLIC PANEL WITH GFR
AG Ratio: 1.7 (calc) (ref 1.0–2.5)
ALT: 30 U/L — ABNORMAL HIGH (ref 6–29)
AST: 25 U/L (ref 10–35)
Albumin: 4 g/dL (ref 3.6–5.1)
Alkaline phosphatase (APISO): 81 U/L (ref 37–153)
BUN: 12 mg/dL (ref 7–25)
CO2: 30 mmol/L (ref 20–32)
Calcium: 9.4 mg/dL (ref 8.6–10.4)
Chloride: 98 mmol/L (ref 98–110)
Creat: 0.99 mg/dL (ref 0.50–1.05)
GFR, Est African American: 75 mL/min/{1.73_m2} (ref >=60)
GFR, Est Non African American: 65 mL/min/{1.73_m2} (ref >=60)
Globulin: 2.3 g/dL (calc) (ref 1.9–3.7)
Glucose, Bld: 171 mg/dL — ABNORMAL HIGH (ref 65–99)
Potassium: 3.4 mmol/L — ABNORMAL LOW (ref 3.5–5.3)
Sodium: 140 mmol/L (ref 135–146)
Total Bilirubin: 0.6 mg/dL (ref 0.2–1.2)
Total Protein: 6.3 g/dL (ref 6.1–8.1)

## 2020-01-25 LAB — TESTOS,TOTAL,FREE AND SHBG (FEMALE)
Free Testosterone: 3.3 pg/mL (ref 0.1–6.4)
Sex Hormone Binding: 11 nmol/L — ABNORMAL LOW (ref 17–124)
Testosterone, Total, LC-MS-MS: 16 ng/dL (ref 2–45)

## 2020-01-25 LAB — T3, FREE: T3, Free: 3.1 pg/mL (ref 2.3–4.2)

## 2020-01-25 LAB — LIPID PANEL
Cholesterol: 208 mg/dL — ABNORMAL HIGH (ref <200)
HDL: 40 mg/dL — ABNORMAL LOW (ref >=50)
LDL Cholesterol (Calc): 133 mg/dL (calc) — ABNORMAL HIGH
Non-HDL Cholesterol (Calc): 168 mg/dL (calc) — ABNORMAL HIGH (ref <130)
Total CHOL/HDL Ratio: 5.2 (calc) — ABNORMAL HIGH (ref <5.0)
Triglycerides: 208 mg/dL — ABNORMAL HIGH (ref <150)

## 2020-01-25 LAB — VITAMIN D 25 HYDROXY (VIT D DEFICIENCY, FRACTURES): Vit D, 25-Hydroxy: 32 ng/mL (ref 30–100)

## 2020-01-25 LAB — HEMOGLOBIN A1C
Hgb A1c MFr Bld: 8 % of total Hgb — ABNORMAL HIGH (ref <5.7)
Mean Plasma Glucose: 183 (calc)
eAG (mmol/L): 10.1 (calc)

## 2020-01-25 LAB — TSH: TSH: 2.21 mIU/L

## 2020-01-25 LAB — T4: T4, Total: 10.9 ug/dL (ref 5.1–11.9)

## 2020-02-09 ENCOUNTER — Ambulatory Visit (INDEPENDENT_AMBULATORY_CARE_PROVIDER_SITE_OTHER): Payer: No Typology Code available for payment source | Admitting: Internal Medicine

## 2020-02-09 ENCOUNTER — Encounter (HOSPITAL_COMMUNITY): Payer: Self-pay | Admitting: *Deleted

## 2020-02-09 ENCOUNTER — Emergency Department (HOSPITAL_COMMUNITY)
Admission: EM | Admit: 2020-02-09 | Discharge: 2020-02-10 | Disposition: A | Discharge status: Home / Self Care | Payer: No Typology Code available for payment source | Attending: Emergency Medicine | Admitting: Emergency Medicine

## 2020-02-09 ENCOUNTER — Other Ambulatory Visit: Payer: Self-pay

## 2020-02-09 DIAGNOSIS — Z79899 Other long term (current) drug therapy: Secondary | ICD-10-CM | POA: Insufficient documentation

## 2020-02-09 DIAGNOSIS — I10 Essential (primary) hypertension: Secondary | ICD-10-CM

## 2020-02-09 DIAGNOSIS — Z87891 Personal history of nicotine dependence: Secondary | ICD-10-CM | POA: Insufficient documentation

## 2020-02-09 DIAGNOSIS — E119 Type 2 diabetes mellitus without complications: Secondary | ICD-10-CM | POA: Diagnosis not present

## 2020-02-09 NOTE — ED Provider Notes (Signed)
[facility] EMERGENCY DEPARTMENT Provider Note   CSN: [id number] Arrival date & time: 02/09/20  2224   History Chief Complaint  Patient presents with  . Hypertension    Sydney Taylor is a 54 y.o. female.  The history is provided by the patient.  Hypertension  She has history of hypertension, hyperlipidemia, bipolar disorder and comes in because of elevated blood pressure at home.  She could hear her heartbeat in her right ear and was worried about that so she checked her blood pressure and noted that it was 180/105.  She denies headache and denies tinnitus or nosebleeds.  There has been no chest pain, heaviness, tightness, pressure.  She denies dyspnea, nausea, vomiting.  She does not check her blood pressure normally, last blood pressure check was when she saw her PCP recently.  Past Medical History:  Diagnosis Date  . Anemia   . Anxiety   . Arrhythmia   . Bipolar depression ([place]) 12/28/2019  . Chronic headaches   . Depression   . Diabetes mellitus without complication ([place])   . Fibromyalgia   . GERD (gastroesophageal reflux disease)   . HLD (hyperlipidemia)   . Hypertension   . Insect bite   . Prediabetes   . Sleep apnea   . Urinary incontinence     Patient Active Problem List   Diagnosis Date Noted  . Bipolar depression ([place]) 12/28/2019  . Sleep apnea 12/28/2019  . Acute lower UTI 10/31/2019  . Hypokalemia 10/31/2019  . Tachycardia 10/31/2019  . Urinary tract infection without hematuria   . Prediabetes 10/30/2015  . Hyperlipidemia 10/30/2015  . Obesity due to excess calories 10/30/2015    Past Surgical History:  Procedure Laterality Date  . Joint fusion on foot Right   . NASAL SEPTUM SURGERY       OB History   No obstetric history on file.     Family History  Problem Relation Age of Onset  . Hypertension Father   . Hyperlipidemia Father   . Diabetes Father   . Hypertension Mother   . Hyperlipidemia Mother   . Diabetes Mellitus II Mother   .  Congestive Heart Failure Mother   . Hypertension Sister   . Diabetes Sister   . Hypertension Brother   . Diabetes Maternal Grandmother   . Lupus Paternal Grandmother     Social History   Tobacco Use  . Smoking status: Former Smoker    Types: Cigarettes    Quit date: 12/30/1982    Years since quitting: 37.1  . Smokeless tobacco: Never Used  Substance Use Topics  . Alcohol use: Not Currently    Alcohol/week: 0.0 standard drinks  . Drug use: No    Home Medications Prior to Admission medications   Medication Sig Start Date End Date Taking? Authorizing Provider  amitriptyline (ELAVIL) 25 MG tablet Take 37.5 mg by mouth at bedtime. 10/06/19   [provider]  chlorthalidone (HYGROTON) 25 MG tablet Take 1 tablet (25 mg total) by mouth daily. Patient taking differently: Take 50 mg by mouth daily.  11/01/19   Roxan Hockey, MD  ergocalciferol (VITAMIN D2) 1.25 MG (50000 UT) capsule Take 50,000 Units by mouth once a week.    [provider]  lamoTRIgine (LAMICTAL) 100 MG tablet Take 100 mg by mouth 2 (two) times daily.     [provider]  losartan (COZAAR) 25 MG tablet Take 25 mg by mouth daily. 12/06/19   [provider]  metoprolol tartrate (LOPRESSOR) 25 MG tablet  Take 25 mg by mouth 2 (two) times daily.    [provider]  potassium chloride SA (KLOR-CON) 20 MEQ tablet Take 1 tablet (20 mEq total) by mouth 2 (two) times daily for 3 days. Patient taking differently: Take 20 mEq by mouth daily.  12/12/19 12/27/20  Maudie Flakes, MD  REXULTI 2 MG TABS tablet Take 2 mg by mouth daily. 10/05/19   [provider]    Allergies    Patient has no known allergies.  Review of Systems   Review of Systems  All other systems reviewed and are negative.   Physical Exam Updated Vital Signs BP 127/77   Pulse (!) 111   Temp 99.6 F (37.6 C) (Oral)   Resp 10   Ht 5\' 10"  (1.778 m)   Wt 122.5 kg   LMP  (LMP Unknown)   SpO2 95%   BMI  38.74 kg/m   Physical Exam Vitals and nursing note reviewed.   54 year old female, resting comfortably and in no acute distress. Vital signs are significant for initially elevated blood pressure which came down to normal with simple observation. Oxygen saturation is 95%, which is normal. Head is normocephalic and atraumatic. PERRLA, EOMI. Oropharynx is clear. Neck is nontender and supple without adenopathy or JVD.  There are no carotid bruits. Back is nontender and there is no CVA tenderness. Lungs are clear without rales, wheezes, or rhonchi. Chest is nontender. Heart has regular rate and rhythm without murmur. Abdomen is soft, flat, nontender without masses or hepatosplenomegaly and peristalsis is normoactive. Extremities have no cyanosis or edema, full range of motion is present. Skin is warm and dry without rash. Neurologic: Mental status is normal, cranial nerves are intact, there are no motor or sensory deficits.  ED Results / Procedures / Treatments    EKG EKG Interpretation  Date/Time:  Wednesday February 09 2020 22:53:51 EST Ventricular Rate:  105 PR Interval:    QRS Duration: 95 QT Interval:  332 QTC Calculation: 439 R Axis:   68 Text Interpretation: Sinus tachycardia Low voltage, precordial leads Borderline repolarization abnormality Baseline wander in lead(s) V3 V4 When compared with ECG of 12/12/2019, No significant change was found Confirmed by Delora Fuel ([id number]) on 02/09/2020 10:55:57 PM  Procedures Procedures   Medications Ordered in ED Medications - No data to display  ED Course  I have reviewed the triage vital signs and the nursing notes.  MDM Rules/Calculators/A&P Transiently elevated blood pressure which has returned to normal.  ECG is unchanged from recent ECG.  Old records are reviewed, and all recent office visits have had normal blood pressure readings.  No indication for any further work-up.  Patient reassured and referred back to PCP.  Advised to  monitor her blood pressure at home on a daily basis.  Final Clinical Impression(s) / ED Diagnoses Final diagnoses:  Elevated blood pressure reading with diagnosis of hypertension    Rx / DC Orders ED Discharge Orders    None       Delora Fuel, MD [id number]

## 2020-02-09 NOTE — Discharge Instructions (Signed)
Please monitor your blood pressure at home.  Check it once a day and keep a record of it and take that record with you when you see your primary care provider.

## 2020-02-09 NOTE — ED Triage Notes (Signed)
Pt states she was walking around earlier today and she states she could hear her heartbeat in her ears; pt states she took her BP and it was 180/105.

## 2020-02-09 NOTE — ED Notes (Signed)
Pt reports walking around her home around 2200 and heard her heartbeat in her ears   She took her blood pressure and it was elevated   Here for eval   She denies CP dyspnea but reports once when this happened she had low potassium

## 2020-02-10 ENCOUNTER — Ambulatory Visit (INDEPENDENT_AMBULATORY_CARE_PROVIDER_SITE_OTHER): Payer: No Typology Code available for payment source | Admitting: Internal Medicine

## 2020-02-10 ENCOUNTER — Encounter (INDEPENDENT_AMBULATORY_CARE_PROVIDER_SITE_OTHER): Payer: Self-pay | Admitting: Internal Medicine

## 2020-02-10 DIAGNOSIS — E1100 Type 2 diabetes mellitus with hyperosmolarity without nonketotic hyperglycemic-hyperosmolar coma (NKHHC): Secondary | ICD-10-CM

## 2020-02-10 DIAGNOSIS — E876 Hypokalemia: Secondary | ICD-10-CM

## 2020-02-10 DIAGNOSIS — E559 Vitamin D deficiency, unspecified: Secondary | ICD-10-CM

## 2020-02-10 DIAGNOSIS — R5383 Other fatigue: Secondary | ICD-10-CM

## 2020-02-10 DIAGNOSIS — R5381 Other malaise: Secondary | ICD-10-CM

## 2020-02-10 NOTE — Patient Instructions (Addendum)
[facility] Dietary Recommendations for Weight Loss What to Avoid . Avoid added sugars o Often added sugar can be found in processed foods such as many condiments, dry cereals, cakes, cookies, chips, crisps, crackers, candies, sweetened drinks, etc.  o Read labels and AVOID/DECREASE use of foods with the following in their ingredient list: Sugar, fructose, high fructose corn syrup, sucrose, glucose, maltose, dextrose, molasses, cane sugar, brown sugar, any type of syrup, agave nectar, etc.   . Avoid snacking in between meals . Avoid foods made with flour o If you are going to eat food made with flour, choose those made with whole-grains; and, minimize your consumption as much as is tolerable . Avoid processed foods o These foods are generally stocked in the middle of the grocery store. Focus on shopping on the perimeter of the grocery.  . Avoid Meat  o We recommend following a plant-based diet at [facility]. Thus, we recommend avoiding meat as a general rule. Consider eating beans, legumes, eggs, and/or dairy products for regular protein sources o If you plan on eating meat limit to 4 ounces of meat at a time and choose lean options such as Fish, chicken, turkey. Avoid red meat intake such as pork and/or steak What to Include . Vegetables o GREEN LEAFY VEGETABLES: Kale, spinach, mustard greens, collard greens, cabbage, broccoli, etc. o OTHER: Asparagus, cauliflower, eggplant, carrots, peas, Brussel sprouts, tomatoes, bell peppers, zucchini, beets, cucumbers, etc. . Grains, seeds, and legumes o Beans: kidney beans, black eyed peas, garbanzo beans, black beans, pinto beans, etc. o Whole, unrefined grains: brown rice, barley, bulgur, oatmeal, etc. . Healthy fats  o Avoid highly processed fats such as vegetable oil o Examples of healthy fats: avocado, olives, virgin olive oil, dark chocolate (?72% Cocoa), nuts (peanuts, almonds, walnuts, cashews, pecans, etc.) . None to Low  Intake of Animal Sources of Protein o Meat sources: chicken, turkey, salmon, tuna. Limit to 4 ounces of meat at one time. o Consider limiting dairy sources, but when choosing dairy focus on: PLAIN Greek yogurt, cottage cheese, high-protein milk . Fruit o Choose berries  When to Eat . Intermittent Fasting: o Choosing not to eat for a specific time period, but DO FOCUS ON HYDRATION when fasting o Multiple Techniques: - Time Restricted Eating: eat 3 meals in a day, each meal lasting no more than 60 minutes, no snacks between meals - 16-18 hour fast: fast for 16 to 18 hours up to 7 days a week. Often suggested to start with 2-3 nonconsecutive days per week.  . Remember the time you sleep is counted as fasting.  . Examples of eating schedule: Fast from 7:00pm-11:00am. Eat between 11:00am-7:00pm.  - 24-hour fast: fast for 24 hours up to every other day. Often suggested to start with 1 day per week . Remember the time you sleep is counted as fasting . Examples of eating schedule:  o Eating day: eat 2-3 meals on your eating day. If doing 2 meals, each meal should last no more than 90 minutes. If doing 3 meals, each meal should last no more than 60 minutes. Finish last meal by 7:00pm. o Fasting day: Fast until 7:00pm.  o IF YOU FEEL UNWELL FOR ANY REASON/IN ANY WAY WHEN FASTING, STOP FASTING BY EATING A NUTRITIOUS SNACK OR LIGHT MEAL o ALWAYS FOCUS ON HYDRATION DURING FASTS - Acceptable Hydration sources: water, broths, tea/coffee (black tea/coffee is best but using a small amount of whole-fat dairy products in coffee/tea is acceptable).  -   Poor Hydration Sources: anything with sugar or artificial sweeteners added to it  These recommendations have been developed for patients that are actively receiving medical care from either Dr. Rosalea Withrow or Sarah Gray, DNP, NP-C at [facility]. These recommendations are developed for patients with specific medical conditions and are not meant to be  distributed or used by others that are not actively receiving care from either provider listed above at [facility]. It is not appropriate to participate in the above eating plans without proper medical supervision.   Reference: Fung, J. The obesity code. [place]: [place]; 2016.   VITAMIN D3 10,000 UNITS/DAY 

## 2020-02-10 NOTE — Progress Notes (Signed)
Metrics: Intervention Frequency ACO  Documented Smoking Status Yearly  Screened one or more times in 24 months  Cessation Counseling or  Active cessation medication Past 24 months  Past 24 months   Guideline developer: UpToDate (See UpToDate for funding source) Date Released: 2014       Wellness Office Visit  Subjective:  Patient ID: Sydney Taylor, female    DOB: Oct 22, 1966  Age: 54 y.o. MRN: [id number]  CC: This lady comes in for follow-up regarding her blood work she had done on the last visit and follow-up of her symptoms of palpitations. HPI  She was seen by Dr. Dorris Carnes, cardiology, who did not recommend further work-up and she felt that she mainly had a sinus tachycardia possibly relating to degree of dehydration and she encouraged her increased water intake which I agree with. She was also in the emergency room yesterday with temporary increase of blood pressure which was since normalized. I reviewed all her blood work with her which shows vitamin D which is suboptimal and her diabetes which is not well controlled at all now.  In fact it appears that she was probably prediabetic but now she is clearly diabetic with a hemoglobin A1c of 8%.  She is not on any medication for this. Her potassium was also on the low end. Past Medical History:  Diagnosis Date  . Anemia   . Anxiety   . Arrhythmia   . Bipolar depression ([place]) 12/28/2019  . Chronic headaches   . Depression   . Diabetes mellitus without complication ([place])   . Fibromyalgia   . GERD (gastroesophageal reflux disease)   . HLD (hyperlipidemia)   . Hypertension   . Insect bite   . Prediabetes   . Sleep apnea   . Urinary incontinence       Family History  Problem Relation Age of Onset  . Hypertension Father   . Hyperlipidemia Father   . Diabetes Father   . Hypertension Mother   . Hyperlipidemia Mother   . Diabetes Mellitus II Mother   . Congestive Heart Failure Mother   . Hypertension Sister   . Diabetes  Sister   . Hypertension Brother   . Diabetes Maternal Grandmother   . Lupus Paternal Grandmother     Social History   Social History Narrative   Married for 3 years.Lives with husband.Bachelors in Education officer, [employer].   Social History   Tobacco Use  . Smoking status: Former Smoker    Types: Cigarettes    Quit date: 12/30/1982    Years since quitting: 37.1  . Smokeless tobacco: Never Used  Substance Use Topics  . Alcohol use: Not Currently    Alcohol/week: 0.0 standard drinks    Current Meds  Medication Sig  . amitriptyline (ELAVIL) 25 MG tablet Take 37.5 mg by mouth at bedtime.  . chlorthalidone (HYGROTON) 25 MG tablet Take 1 tablet (25 mg total) by mouth daily. (Patient taking differently: Take 50 mg by mouth daily. )  . ergocalciferol (VITAMIN D2) 1.25 MG (50000 UT) capsule Take 50,000 Units by mouth once a week.  . lamoTRIgine (LAMICTAL) 100 MG tablet Take 100 mg by mouth 2 (two) times daily.   Marland Kitchen losartan (COZAAR) 25 MG tablet Take 25 mg by mouth daily.  . metoprolol tartrate (LOPRESSOR) 25 MG tablet Take 25 mg by mouth 2 (two) times daily.  . potassium chloride SA (KLOR-CON) 20 MEQ tablet Take 1 tablet (20 mEq total) by mouth 2 (two) times daily for 3 days. (Patient taking  differently: Take 20 mEq by mouth daily. )  . REXULTI 2 MG TABS tablet Take 2 mg by mouth daily.       Objective:   Today's Vitals: BP 140/90 (BP Location: Right Arm, Patient Position: Sitting, Cuff Size: Normal)   Pulse (!) 105   Temp (!) 97.4 F (36.3 C) (Temporal)   Resp 18   Ht 5\' 10"  (1.778 m)   Wt 274 lb 12.8 oz (124.6 kg)   LMP  (LMP Unknown)   SpO2 99% Comment: wearing mask  BMI 39.43 kg/m  Vitals with BMI 02/10/2020 02/09/2020 02/09/2020  Height 5\' 10"  - -  Weight 274 lbs 13 oz - -  BMI 123XX123 - -  Systolic [id number]  Diastolic 90 65 77  Pulse 123456 100 -     Physical Exam   She remains obese and almost morbidly obese.  Her blood pressure is elevated today but not extremely high.  She is  alert and oriented.    Assessment   1. Morbid obesity ([place])   2. Hypokalemia   3. Type 2 diabetes mellitus with hyperosmolarity without coma, without long-term current use of insulin ([place])   4. Vitamin D deficiency disease   5. Malaise and fatigue       Tests ordered No orders of the defined types were placed in this encounter.    Plan: 1. I discussed with her diabetes and the improvement of her diet.  I discussed the concept of intermittent fasting combined with a plant-based diet.  Thankfully, she is already vegetarian and I have given a handout for diet. 2. I encouraged her to make sure she drinks at least 1 gallon of water daily.  She should also eat foods higher in potassium and we discussed these also. 3. I recommended that she start taking vitamin D3 10,000 units daily. 4. I will see her in 4 to 6 weeks time to see how she is doing and we will have a another conversation regarding hormones which I think she may benefit from as I see that her testosterone levels are suboptimal and she is menopausal and T3 levels are also suboptimal. 5. Today I spent 30 minutes with this patient discussing her nutrition and all her blood work in detail.   No orders of the defined types were placed in this encounter.   Doree Albee, MD

## 2020-02-25 ENCOUNTER — Ambulatory Visit: Payer: No Typology Code available for payment source

## 2020-02-25 ENCOUNTER — Ambulatory Visit: Payer: No Typology Code available for payment source | Attending: Internal Medicine

## 2020-02-25 ENCOUNTER — Other Ambulatory Visit: Payer: Self-pay

## 2020-02-25 DIAGNOSIS — Z20822 Contact with and (suspected) exposure to covid-19: Secondary | ICD-10-CM

## 2020-02-26 LAB — NOVEL CORONAVIRUS, NAA: SARS-CoV-2, NAA: NOT DETECTED

## 2020-02-29 ENCOUNTER — Encounter (INDEPENDENT_AMBULATORY_CARE_PROVIDER_SITE_OTHER): Payer: Self-pay | Admitting: Internal Medicine

## 2020-02-29 ENCOUNTER — Other Ambulatory Visit: Payer: Self-pay

## 2020-02-29 ENCOUNTER — Ambulatory Visit (INDEPENDENT_AMBULATORY_CARE_PROVIDER_SITE_OTHER): Payer: No Typology Code available for payment source | Admitting: Internal Medicine

## 2020-02-29 DIAGNOSIS — M791 Myalgia, unspecified site: Secondary | ICD-10-CM | POA: Diagnosis not present

## 2020-02-29 DIAGNOSIS — R600 Localized edema: Secondary | ICD-10-CM

## 2020-02-29 DIAGNOSIS — R110 Nausea: Secondary | ICD-10-CM

## 2020-02-29 MED ORDER — ONDANSETRON HCL 4 MG PO TABS: 4.0000 mg | ORAL_TABLET | Freq: Three times a day (TID) | ORAL | 0 refills | Status: DC | PRN

## 2020-02-29 NOTE — Progress Notes (Signed)
Metrics: Intervention Frequency ACO  Documented Smoking Status Yearly  Screened one or more times in 24 months  Cessation Counseling or  Active cessation medication Past 24 months  Past 24 months   Guideline developer: UpToDate (See UpToDate for funding source) Date Released: 2014       Wellness Office Visit  Subjective:  Patient ID: Sydney Taylor, female    DOB: 03/10/1966  Age: 54 y.o. MRN: [id number]  CC: This is an audio telemedicine visit with the permission of the patient who is at home and I am in my office.  I identified her by 2 identifiers. Chief complaints are  body aches, leg edema and nausea.  HPI  She has had the above symptoms for the last 3 to 4 weeks she says although when I had seen her for an office visit approximately 3 weeks ago, I do not remember her mentioning the symptoms.  She denies any fevers.  She denies any respiratory symptoms.  She did have a COVID-19 test a few days ago and it was negative.  Her main symptom that she is concerned about is the nausea.  She has not had excessive vomiting and she does not have any significant abdominal pain or diarrhea. She also notices that when she is sitting in a car, she gets worsening lower leg edema.  She denies any chest pain or dyspnea. Past Medical History:  Diagnosis Date  . Anemia   . Anxiety   . Arrhythmia   . Bipolar depression ([place]) 12/28/2019  . Chronic headaches   . Depression   . Diabetes mellitus without complication ([place])   . Fibromyalgia   . GERD (gastroesophageal reflux disease)   . HLD (hyperlipidemia)   . Hypertension   . Insect bite   . Prediabetes   . Sleep apnea   . Urinary incontinence       Family History  Problem Relation Age of Onset  . Hypertension Father   . Hyperlipidemia Father   . Diabetes Father   . Hypertension Mother   . Hyperlipidemia Mother   . Diabetes Mellitus II Mother   . Congestive Heart Failure Mother   . Hypertension Sister   . Diabetes Sister   .  Hypertension Brother   . Diabetes Maternal Grandmother   . Lupus Paternal Grandmother     Social History   Social History Narrative   Married for 3 years.Lives with husband.Bachelors in Education officer, [employer].   Social History   Tobacco Use  . Smoking status: Former Smoker    Types: Cigarettes    Quit date: 12/30/1982    Years since quitting: 37.1  . Smokeless tobacco: Never Used  Substance Use Topics  . Alcohol use: Not Currently    Alcohol/week: 0.0 standard drinks    Current Meds  Medication Sig  . amitriptyline (ELAVIL) 25 MG tablet Take 37.5 mg by mouth at bedtime.  . chlorthalidone (HYGROTON) 25 MG tablet Take 1 tablet (25 mg total) by mouth daily. (Patient taking differently: Take 50 mg by mouth daily. )  . Cholecalciferol (VITAMIN D-3) 125 MCG (5000 UT) TABS Take 2 tablets by mouth daily.  Marland Kitchen lamoTRIgine (LAMICTAL) 100 MG tablet Take 100 mg by mouth 2 (two) times daily.   Marland Kitchen losartan (COZAAR) 25 MG tablet Take 25 mg by mouth daily.  . metoprolol tartrate (LOPRESSOR) 50 MG tablet Take 50 mg by mouth 2 (two) times daily.   . potassium chloride SA (KLOR-CON) 20 MEQ tablet Take 1 tablet (20 mEq total) by mouth  2 (two) times daily for 3 days. (Patient taking differently: Take 20 mEq by mouth daily. )  . REXULTI 2 MG TABS tablet Take 2 mg by mouth daily.  . [DISCONTINUED] ergocalciferol (VITAMIN D2) 1.25 MG (50000 UT) capsule Take 50,000 Units by mouth once a week.       Objective:   Today's Vitals: LMP  (LMP Unknown)  Vitals with BMI 02/29/2020 02/10/2020 02/09/2020  Height (No Data) 5\' 10"  -  Weight (No Data) 274 lbs 13 oz -  BMI - 123XX123 -  Systolic (No Data) [id number]  Diastolic (No Data) 90 65  Pulse - 105 100     Physical Exam   Her speech is normal on the phone and she appears to be alert and orientated.   Assessment   1. Nausea   2. Myalgia   3. Bilateral leg edema       Tests ordered No orders of the defined types were placed in this encounter.    Plan: 1. I  told her I am not sure what is the cause of all her symptoms but certainly, despite the negative COVID-19 test, she could have COVID-19 disease. 2. We will treat her symptomatically and I have sent a prescription of Zofran to her pharmacy. 3. In terms of the bilateral leg edema, I recommended elevation of her legs 3 times a day for 20 minutes each time.  She can also try compression stockings if she wants to. 4. I have told her if her symptoms do not improve in about a week's time, she should call us back.  If her symptoms get worse within the week, she should also call us back. 5. This phone call lasted 11 minutes.   Meds ordered this encounter  Medications  . ondansetron (ZOFRAN) 4 MG tablet    Sig: Take 1 tablet (4 mg total) by mouth every 8 (eight) hours as needed for nausea or vomiting.    Dispense:  20 tablet    Refill:  0    Delon Revelo Luther Parody, MD

## 2020-03-14 ENCOUNTER — Telehealth (INDEPENDENT_AMBULATORY_CARE_PROVIDER_SITE_OTHER): Payer: Self-pay | Admitting: Internal Medicine

## 2020-03-15 NOTE — Telephone Encounter (Signed)
done

## 2020-03-16 ENCOUNTER — Other Ambulatory Visit (INDEPENDENT_AMBULATORY_CARE_PROVIDER_SITE_OTHER): Payer: Self-pay | Admitting: Internal Medicine

## 2020-03-16 ENCOUNTER — Telehealth (INDEPENDENT_AMBULATORY_CARE_PROVIDER_SITE_OTHER): Payer: Self-pay | Admitting: Internal Medicine

## 2020-03-16 MED ORDER — CHLORTHALIDONE 50 MG PO TABS: 50.0000 mg | ORAL_TABLET | Freq: Every day | ORAL | 0 refills | Status: DC

## 2020-03-17 ENCOUNTER — Other Ambulatory Visit: Payer: Self-pay

## 2020-03-17 ENCOUNTER — Ambulatory Visit
Admission: EM | Admit: 2020-03-17 | Discharge: 2020-03-17 | Disposition: A | Discharge status: Home / Self Care | Payer: No Typology Code available for payment source | Source: Home / Self Care

## 2020-03-17 ENCOUNTER — Emergency Department (HOSPITAL_COMMUNITY): Payer: No Typology Code available for payment source

## 2020-03-17 ENCOUNTER — Encounter (HOSPITAL_COMMUNITY): Payer: Self-pay | Admitting: Emergency Medicine

## 2020-03-17 ENCOUNTER — Emergency Department (HOSPITAL_COMMUNITY)
Admission: EM | Admit: 2020-03-17 | Discharge: 2020-03-17 | Disposition: A | Discharge status: Home / Self Care | Payer: No Typology Code available for payment source | Attending: Emergency Medicine | Admitting: Emergency Medicine

## 2020-03-17 DIAGNOSIS — I10 Essential (primary) hypertension: Secondary | ICD-10-CM | POA: Diagnosis not present

## 2020-03-17 DIAGNOSIS — Z79899 Other long term (current) drug therapy: Secondary | ICD-10-CM | POA: Insufficient documentation

## 2020-03-17 DIAGNOSIS — R739 Hyperglycemia, unspecified: Secondary | ICD-10-CM

## 2020-03-17 DIAGNOSIS — E1165 Type 2 diabetes mellitus with hyperglycemia: Secondary | ICD-10-CM | POA: Diagnosis not present

## 2020-03-17 DIAGNOSIS — R5383 Other fatigue: Secondary | ICD-10-CM

## 2020-03-17 DIAGNOSIS — E876 Hypokalemia: Secondary | ICD-10-CM

## 2020-03-17 DIAGNOSIS — E0865 Diabetes mellitus due to underlying condition with hyperglycemia: Secondary | ICD-10-CM

## 2020-03-17 DIAGNOSIS — R519 Headache, unspecified: Secondary | ICD-10-CM | POA: Diagnosis present

## 2020-03-17 DIAGNOSIS — H538 Other visual disturbances: Secondary | ICD-10-CM

## 2020-03-17 DIAGNOSIS — R110 Nausea: Secondary | ICD-10-CM

## 2020-03-17 LAB — CBC WITH DIFFERENTIAL/PLATELET
Abs Immature Granulocytes: 0.03 10*3/uL (ref 0.00–0.07)
Basophils Absolute: 0.1 10*3/uL (ref 0.0–0.1)
Basophils Relative: 1 %
Eosinophils Absolute: 0.2 10*3/uL (ref 0.0–0.5)
Eosinophils Relative: 3 %
HCT: 47.7 % — ABNORMAL HIGH (ref 36.0–46.0)
Hemoglobin: 16.2 g/dL — ABNORMAL HIGH (ref 12.0–15.0)
Immature Granulocytes: 0 %
Lymphocytes Relative: 23 %
Lymphs Abs: 2 10*3/uL (ref 0.7–4.0)
MCH: 30.1 pg (ref 26.0–34.0)
MCHC: 34 g/dL (ref 30.0–36.0)
MCV: 88.5 fL (ref 80.0–100.0)
Monocytes Absolute: 0.7 10*3/uL (ref 0.1–1.0)
Monocytes Relative: 9 %
Neutro Abs: 5.6 10*3/uL (ref 1.7–7.7)
Neutrophils Relative %: 64 %
Platelets: 272 10*3/uL (ref 150–400)
RBC: 5.39 MIL/uL — ABNORMAL HIGH (ref 3.87–5.11)
RDW: 12.7 % (ref 11.5–15.5)
WBC: 8.6 10*3/uL (ref 4.0–10.5)
nRBC: 0 % (ref 0.0–0.2)

## 2020-03-17 LAB — COMPREHENSIVE METABOLIC PANEL
ALT: 37 U/L (ref 0–44)
AST: 44 U/L — ABNORMAL HIGH (ref 15–41)
Albumin: 4 g/dL (ref 3.5–5.0)
Alkaline Phosphatase: 97 U/L (ref 38–126)
Anion gap: 19 — ABNORMAL HIGH (ref 5–15)
BUN: 16 mg/dL (ref 6–20)
CO2: 20 mmol/L — ABNORMAL LOW (ref 22–32)
Calcium: 9.2 mg/dL (ref 8.9–10.3)
Chloride: 91 mmol/L — ABNORMAL LOW (ref 98–111)
Creatinine, Ser: 1.35 mg/dL — ABNORMAL HIGH (ref 0.44–1.00)
GFR calc Af Amer: 51 mL/min — ABNORMAL LOW (ref >60)
GFR calc non Af Amer: 44 mL/min — ABNORMAL LOW (ref >60)
Glucose, Bld: 466 mg/dL — ABNORMAL HIGH (ref 70–99)
Potassium: 2.9 mmol/L — ABNORMAL LOW (ref 3.5–5.1)
Sodium: 130 mmol/L — ABNORMAL LOW (ref 135–145)
Total Bilirubin: 1.5 mg/dL — ABNORMAL HIGH (ref 0.3–1.2)
Total Protein: 6.9 g/dL (ref 6.5–8.1)

## 2020-03-17 LAB — CBG MONITORING, ED
Glucose-Capillary: 484 mg/dL — ABNORMAL HIGH (ref 70–99)
Glucose-Capillary: 373 mg/dL — ABNORMAL HIGH (ref 70–99)

## 2020-03-17 LAB — URINALYSIS, ROUTINE W REFLEX MICROSCOPIC
Bacteria, UA: NONE SEEN
Bilirubin Urine: NEGATIVE
Glucose, UA: >=500 mg/dL — AB
Hgb urine dipstick: NEGATIVE
Ketones, ur: 80 mg/dL — AB
Leukocytes,Ua: NEGATIVE
Nitrite: NEGATIVE
Protein, ur: NEGATIVE mg/dL
Specific Gravity, Urine: 1.027 (ref 1.005–1.030)
pH: 5 (ref 5.0–8.0)

## 2020-03-17 MED ORDER — INSULIN ASPART 100 UNIT/ML ~~LOC~~ SOLN
5.0000 [IU] | Freq: Once | SUBCUTANEOUS | Status: AC
  Administered 2020-03-17: 5 [IU] via SUBCUTANEOUS
  Filled 2020-03-17: qty 1

## 2020-03-17 MED ORDER — SODIUM CHLORIDE 0.9 % IV BOLUS
1000.0000 mL | Freq: Once | INTRAVENOUS | Status: AC
  Administered 2020-03-17: 1000 mL via INTRAVENOUS

## 2020-03-17 MED ORDER — PROMETHAZINE HCL 25 MG PO TABS: 25.0000 mg | ORAL_TABLET | Freq: Two times a day (BID) | ORAL | 0 refills | Status: DC | PRN

## 2020-03-17 MED ORDER — GLIPIZIDE 5 MG PO TABS: 5.0000 mg | ORAL_TABLET | Freq: Every day | ORAL | 0 refills | Status: DC

## 2020-03-17 MED ORDER — POTASSIUM CHLORIDE CRYS ER 20 MEQ PO TBCR: 20.0000 meq | EXTENDED_RELEASE_TABLET | Freq: Every day | ORAL | 0 refills | Status: DC

## 2020-03-17 MED ORDER — POTASSIUM CHLORIDE CRYS ER 20 MEQ PO TBCR
40.0000 meq | EXTENDED_RELEASE_TABLET | Freq: Once | ORAL | Status: AC
  Administered 2020-03-17: 40 meq via ORAL
  Filled 2020-03-17: qty 2

## 2020-03-17 NOTE — ED Provider Notes (Signed)
[facility] EMERGENCY DEPARTMENT Provider Note   CSN: [id number] Arrival date & time: 03/17/20  1804     History Chief Complaint  Patient presents with  . Hyperglycemia    Sydney Taylor is a 54 y.o. female.  Patient complains of weakness and some blurred vision.  The history is provided by the patient. No language interpreter was used.  Weakness Severity:  Mild Onset quality:  Sudden Timing:  Constant Progression:  Waxing and waning Chronicity:  New Context: not alcohol use   Relieved by:  Nothing Worsened by:  Nothing Ineffective treatments:  None tried Associated symptoms: no abdominal pain, no arthralgias, no chest pain, no cough, no dysuria, no fever, no seizures, no shortness of breath and no vomiting        Past Medical History:  Diagnosis Date  . Anemia   . Anxiety   . Arrhythmia   . Bipolar depression ([place]) 12/28/2019  . Chronic headaches   . Depression   . Diabetes mellitus without complication ([place])   . Fibromyalgia   . GERD (gastroesophageal reflux disease)   . HLD (hyperlipidemia)   . Hypertension   . Insect bite   . Prediabetes   . Sleep apnea   . Urinary incontinence     Patient Active Problem List   Diagnosis Date Noted  . Bipolar depression ([place]) 12/28/2019  . Sleep apnea 12/28/2019  . Acute lower UTI 10/31/2019  . Hypokalemia 10/31/2019  . Tachycardia 10/31/2019  . Urinary tract infection without hematuria   . Prediabetes 10/30/2015  . Hyperlipidemia 10/30/2015  . Obesity due to excess calories 10/30/2015    Past Surgical History:  Procedure Laterality Date  . Joint fusion on foot Right   . NASAL SEPTUM SURGERY       OB History   No obstetric history on file.     Family History  Problem Relation Age of Onset  . Hypertension Father   . Hyperlipidemia Father   . Diabetes Father   . Hypertension Mother   . Hyperlipidemia Mother   . Diabetes Mellitus II Mother   . Congestive Heart Failure Mother   . Hypertension  Sister   . Diabetes Sister   . Hypertension Brother   . Diabetes Maternal Grandmother   . Lupus Paternal Grandmother     Social History   Tobacco Use  . Smoking status: Former Smoker    Types: Cigarettes    Quit date: 12/30/1982    Years since quitting: 37.2  . Smokeless tobacco: Never Used  Substance Use Topics  . Alcohol use: Not Currently    Alcohol/week: 0.0 standard drinks  . Drug use: No    Home Medications Prior to Admission medications   Medication Sig Start Date End Date Taking? Authorizing Provider  amitriptyline (ELAVIL) 25 MG tablet Take 37.5 mg by mouth at bedtime. 10/06/19  Yes [provider]  chlorthalidone (HYGROTON) 50 MG tablet Take 1 tablet (50 mg total) by mouth daily. 03/16/20  Yes Gosrani, Nimish C, MD  Cholecalciferol (VITAMIN D-3) 125 MCG (5000 UT) TABS Take 2 tablets by mouth daily.   Yes [provider]  ibuprofen (ADVIL) 200 MG tablet Take 200 mg by mouth every 6 (six) hours as needed.   Yes [provider]  lamoTRIgine (LAMICTAL) 100 MG tablet Take 100 mg by mouth 2 (two) times daily.    Yes [provider]  metoprolol tartrate (LOPRESSOR) 50 MG tablet Take 50 mg by mouth 2 (two) times daily.    Yes  [provider]  Potassium Chloride ER 20 MEQ TBCR Take 1 tablet by mouth daily. 03/07/20  Yes [provider]  REXULTI 2 MG TABS tablet Take 2 mg by mouth daily. 10/05/19  Yes [provider]  glipiZIDE (GLUCOTROL) 5 MG tablet Take 1 tablet (5 mg total) by mouth daily before breakfast. 03/17/20   Milton Ferguson, MD  potassium chloride SA (KLOR-CON) 20 MEQ tablet Take 1 tablet (20 mEq total) by mouth daily. 03/17/20   Milton Ferguson, MD  promethazine (PHENERGAN) 25 MG tablet Take 1 tablet (25 mg total) by mouth every 12 (twelve) hours as needed for nausea or vomiting. 03/17/20 03/17/20  Lestine Box, PA-C    Allergies    Patient has no known allergies.  Review of Systems   Review of Systems    Constitutional: Negative for chills and fever.  HENT: Negative for ear pain and sore throat.   Eyes: Negative for pain and visual disturbance.  Respiratory: Negative for cough and shortness of breath.   Cardiovascular: Negative for chest pain and palpitations.  Gastrointestinal: Negative for abdominal pain and vomiting.  Genitourinary: Negative for dysuria and hematuria.  Musculoskeletal: Negative for arthralgias and back pain.  Skin: Negative for color change and rash.  Neurological: Positive for weakness. Negative for seizures and syncope.  All other systems reviewed and are negative.   Physical Exam Updated Vital Signs BP 108/89   Pulse (!) 110   Temp 98.4 F (36.9 C) (Oral)   Resp 16   Ht 5\' 10"  (1.778 m)   Wt 117.9 kg   LMP  (LMP Unknown)   SpO2 99%   BMI 37.31 kg/m   Physical Exam Vitals and nursing note reviewed.  Constitutional:      Appearance: She is well-developed.  HENT:     Head: Normocephalic.     Nose: Nose normal.  Eyes:     General: No scleral icterus.    Conjunctiva/sclera: Conjunctivae normal.  Neck:     Thyroid: No thyromegaly.  Cardiovascular:     Rate and Rhythm: Normal rate and regular rhythm.     Heart sounds: No murmur. No friction rub. No gallop.   Pulmonary:     Breath sounds: No stridor. No wheezing or rales.  Chest:     Chest wall: No tenderness.  Abdominal:     General: There is no distension.     Tenderness: There is no abdominal tenderness. There is no rebound.  Musculoskeletal:        General: Normal range of motion.     Cervical back: Neck supple.  Lymphadenopathy:     Cervical: No cervical adenopathy.  Skin:    Findings: No erythema or rash.  Neurological:     Mental Status: She is alert and oriented to person, place, and time.     Motor: No abnormal muscle tone.     Coordination: Coordination normal.  Psychiatric:        Behavior: Behavior normal.     ED Results / Procedures / Treatments   Labs (all labs ordered  are listed, but only abnormal results are displayed) Labs Reviewed  CBC WITH DIFFERENTIAL/PLATELET - Abnormal; Notable for the following components:      Result Value   RBC 5.39 (*)    Hemoglobin 16.2 (*)    HCT 47.7 (*)    All other components within normal limits  COMPREHENSIVE METABOLIC PANEL - Abnormal; Notable for the following components:   Sodium 130 (*)    Potassium  2.9 (*)    Chloride 91 (*)    CO2 20 (*)    Glucose, Bld 466 (*)    Creatinine, Ser 1.35 (*)    AST 44 (*)    Total Bilirubin 1.5 (*)    GFR calc non Af Amer 44 (*)    GFR calc Af Amer 51 (*)    Anion gap 19 (*)    All other components within normal limits  URINALYSIS, ROUTINE W REFLEX MICROSCOPIC - Abnormal; Notable for the following components:   Glucose, UA >=500 (*)    Ketones, ur 80 (*)    All other components within normal limits  CBG MONITORING, ED - Abnormal; Notable for the following components:   Glucose-Capillary 484 (*)    All other components within normal limits  CBG MONITORING, ED - Abnormal; Notable for the following components:   Glucose-Capillary 373 (*)    All other components within normal limits    EKG None  Radiology DG Chest Port 1 View  Result Date: 03/17/2020 CLINICAL DATA:  Shortness of breath, elevated blood glucose EXAM: PORTABLE CHEST 1 VIEW COMPARISON:  Radiograph 12/12/2019 FINDINGS: Chronic mild elevation of the right hemidiaphragm with adjacent atelectatic changes in the right lung base. Accounting for body habitus, the lungs are clear. No consolidation, features of edema, pneumothorax, or effusion. The cardiomediastinal contours are unremarkable. No acute osseous or soft tissue abnormality. Degenerative changes are present in the imaged spine and shoulders. Telemetry leads overlie the chest. IMPRESSION: No acute cardiopulmonary abnormality. Electronically Signed   By: Lovena Le M.D.   On: 03/17/2020 19:33    Procedures Procedures (including critical care  time)  Medications Ordered in ED Medications  sodium chloride 0.9 % bolus 1,000 mL (0 mLs Intravenous Stopped 03/17/20 2047)  insulin aspart (novoLOG) injection 5 Units (5 Units Subcutaneous Given 03/17/20 1928)  potassium chloride SA (KLOR-CON) CR tablet 40 mEq (40 mEq Oral Given 03/17/20 2047)    ED Course  I have reviewed the triage vital signs and the nursing notes.  Pertinent labs & imaging results that were available during my care of the patient were reviewed by me and considered in my medical decision making (see chart for details).    MDM Rules/Calculators/A&P                      Patient with new onset diabetes and hypokalemia.  Patient given fluids and insulin and her sugar improved.  Also her symptoms of weakness improved.  She was sent home on glipizide because she cannot take Glucophage and will follow up with her doctor this week.  Patient also given potassium for hypokalemia Final Clinical Impression(s) / ED Diagnoses Final diagnoses:  Hyperglycemia  Hypokalemia    Rx / DC Orders ED Discharge Orders         Ordered    glipiZIDE (GLUCOTROL) 5 MG tablet  Daily before breakfast     03/17/20 2140    potassium chloride SA (KLOR-CON) 20 MEQ tablet  Daily     03/17/20 2141           Milton Ferguson, MD 03/17/20 2146

## 2020-03-17 NOTE — ED Provider Notes (Signed)
[place]   [id number] 03/17/20 Arrival Time: [id number]   CC: HA, runny nose, nausea, body aches, vision changes  SUBJECTIVE: History from: patient.  Bhavya Fabien is a 54 y.o. female hx significant for anemia, anxiety, bipolar, chronic HA, depression, DM, fibromyalgia, GERD, HLD, HTN, sleep apnea, and urinary incontinence, who presents with intermittent daily frontal headache (currently asymptomatic), runny nose, nausea, and body aches x 4 weeks.  Has been followed by PCP for these symptoms.  Had COVID testing on 2/26 that was negative, but PCP recommended retest.  Denies sick exposure to COVID, flu or strep.  Has tried OTC medications with temporary relief.  Tried zofran but discontinued due to diarrhea as a SE.  Denies aggravating factors.  Denies previous symptoms in the past.  Also reports blurry vision over the past few days.  Denies fever, chills, nasal congestion, sore throat, cough, SOB, wheezing, chest pain, vomiting, changes in bowel or bladder habits, facial droop, slurred speech, weakness in arms or legs, polydipsia, polyuria.    Hx significant for DM.  Currently on diet for control.  Does not check daily sugar daily.  Does mention being on metformin years ago that made her stomach upset.    ROS: As per HPI.  All other pertinent ROS negative.     Past Medical History:  Diagnosis Date  . Anemia   . Anxiety   . Arrhythmia   . Bipolar depression ([place]) 12/28/2019  . Chronic headaches   . Depression   . Diabetes mellitus without complication ([place])   . Fibromyalgia   . GERD (gastroesophageal reflux disease)   . HLD (hyperlipidemia)   . Hypertension   . Insect bite   . Prediabetes   . Sleep apnea   . Urinary incontinence    Past Surgical History:  Procedure Laterality Date  . Joint fusion on foot Right   . NASAL SEPTUM SURGERY     No Known Allergies No current facility-administered medications on file prior to encounter.   Current Outpatient Medications on  File Prior to Encounter  Medication Sig Dispense Refill  . amitriptyline (ELAVIL) 25 MG tablet Take 37.5 mg by mouth at bedtime.    . chlorthalidone (HYGROTON) 50 MG tablet Take 1 tablet (50 mg total) by mouth daily. 90 tablet 0  . Cholecalciferol (VITAMIN D-3) 125 MCG (5000 UT) TABS Take 2 tablets by mouth daily.    Marland Kitchen lamoTRIgine (LAMICTAL) 100 MG tablet Take 100 mg by mouth 2 (two) times daily.     . metoprolol tartrate (LOPRESSOR) 50 MG tablet Take 50 mg by mouth 2 (two) times daily.     . potassium chloride SA (KLOR-CON) 20 MEQ tablet Take 1 tablet (20 mEq total) by mouth 2 (two) times daily for 3 days. (Patient taking differently: Take 20 mEq by mouth daily. ) 6 tablet 0  . REXULTI 2 MG TABS tablet Take 2 mg by mouth daily.     Social History   Socioeconomic History  . Marital status: Married    Spouse name: Not on file  . Number of children: 0  . Years of education: Not on file  . Highest education level: Not on file  Occupational History  . Occupation: research associate/drug development  Tobacco Use  . Smoking status: Former Smoker    Types: Cigarettes    Quit date: 12/30/1982    Years since quitting: 37.2  . Smokeless tobacco: Never Used  Substance and Sexual Activity  . Alcohol use: Not Currently  Alcohol/week: 0.0 standard drinks  . Drug use: No  . Sexual activity: Never  Other Topics Concern  . Not on file  Social History Narrative   Married for 3 years.Lives with husband.Bachelors in Education officer, museum.   Social Determinants of Health   Financial Resource Strain:   . Difficulty of Paying Living Expenses:   Food Insecurity:   . Worried About Charity fundraiser in the Last Year:   . Arboriculturist in the Last Year:   Transportation Needs:   . Film/video editor (Medical):   Marland Kitchen Lack of Transportation (Non-Medical):   Physical Activity:   . Days of Exercise per Week:   . Minutes of Exercise per Session:   Stress:   . Feeling of Stress :   Social Connections:   .  Frequency of Communication with Friends and Family:   . Frequency of Social Gatherings with Friends and Family:   . Attends Religious Services:   . Active Member of Clubs or Organizations:   . Attends Archivist Meetings:   Marland Kitchen Marital Status:   Intimate Partner Violence:   . Fear of Current or Ex-Partner:   . Emotionally Abused:   Marland Kitchen Physically Abused:   . Sexually Abused:    Family History  Problem Relation Age of Onset  . Hypertension Father   . Hyperlipidemia Father   . Diabetes Father   . Hypertension Mother   . Hyperlipidemia Mother   . Diabetes Mellitus II Mother   . Congestive Heart Failure Mother   . Hypertension Sister   . Diabetes Sister   . Hypertension Brother   . Diabetes Maternal Grandmother   . Lupus Paternal Grandmother     OBJECTIVE:  Vitals:   03/17/20 1701  BP: 140/90  Pulse: (!) 119  Resp: 16  Temp: 98.8 F (37.1 C)  TempSrc: Oral  SpO2: 95%    HR: improved 109-110  Visual acuity:  RT eye: 20/16 LT eye: 20/30 Bil eye: 20/16  General appearance: alert; appears fatigued, but nontoxic; speaking in full sentences and tolerating own secretions HEENT: NCAT; Ears: EACs clear, TMs pearly gray; Eyes: PERRL.  EOM grossly intact. Nose: nares patent without rhinorrhea, Throat: oropharynx clear, tonsils non erythematous or enlarged, uvula midline  Neck: supple without LAD Lungs: unlabored respirations, symmetrical air entry; cough: absent; no respiratory distress; CTAB Heart: regular rate and rhythm.   Neuro: CN 2-12 grossly intact; strength and sensation intact about the bilateral upper and lower extremities; negative pronator drift; finger to nose without difficulty Skin: warm and dry Psychological: alert and cooperative; flat mood and affect  LABS:  Blood glucose: 430  ASSESSMENT & PLAN:  1. Diabetes mellitus due to underlying condition with hyperglycemia, without long-term current use of insulin ([place])   2. Blurred vision   3. Nausea  without vomiting   4. Other fatigue     Meds ordered this encounter  Medications  . DISCONTD: promethazine (PHENERGAN) 25 MG tablet    Sig: Take 1 tablet (25 mg total) by mouth every 12 (twelve) hours as needed for nausea or vomiting.    Dispense:  15 tablet    Refill:  0    Order Specific Question:   Supervising Provider    Answer:   Raylene Everts [id number]   Blood glucose in office of 430 with complaint of nausea, headache, blurred vision, and fatigue x 1 month.  Hx of diabetes, but currently is being managed by diet.  Recommending further evaluation and  management in the ED.  Patient aware and in agreement with this plan.  Will go by private vehicle with husband as driver to [facility] ED.          Lestine Box, PA-C 03/17/20 1749

## 2020-03-17 NOTE — ED Triage Notes (Signed)
Pt blood glucose 450 at urgent care today.  Pt's doctor had her on diabetic diet but no medications yet.  Pt dx with diabetes x 6 weeks ago.

## 2020-03-17 NOTE — Discharge Instructions (Signed)
Blood glucose in office of 430 with complaint of nausea, headache, blurred vision, and fatigue x 1 month.  Hx of diabetes, but currently is being managed by diet.  Recommending further evaluation and management in the ED.  Patient aware and in agreement with this plan.  Will go by private vehicle with husband as driver to [facility] ED.

## 2020-03-17 NOTE — ED Triage Notes (Signed)
Pt presents with c/o HA, nausea, body aches, blurriness with distance vision x several weeks.  Tested for COVID on 02/26 after having these s/s for one week.  Test was negative.  Negative.  States her pcp sent her to urgent care for retesting.

## 2020-03-17 NOTE — Discharge Instructions (Addendum)
Follow-up with your family doctor next week to see how your sugar is doing.  Drink plenty of fluids.  We will also put you on some potassium

## 2020-03-18 LAB — NOVEL CORONAVIRUS, NAA: SARS-CoV-2, NAA: NOT DETECTED

## 2020-03-20 ENCOUNTER — Telehealth (INDEPENDENT_AMBULATORY_CARE_PROVIDER_SITE_OTHER): Payer: No Typology Code available for payment source | Admitting: Internal Medicine

## 2020-03-20 ENCOUNTER — Encounter (INDEPENDENT_AMBULATORY_CARE_PROVIDER_SITE_OTHER): Payer: Self-pay | Admitting: Internal Medicine

## 2020-03-20 DIAGNOSIS — E1100 Type 2 diabetes mellitus with hyperosmolarity without nonketotic hyperglycemic-hyperosmolar coma (NKHHC): Secondary | ICD-10-CM | POA: Diagnosis not present

## 2020-03-20 NOTE — Progress Notes (Signed)
Metrics: Intervention Frequency ACO  Documented Smoking Status Yearly  Screened one or more times in 24 months  Cessation Counseling or  Active cessation medication Past 24 months  Past 24 months   Guideline developer: UpToDate (See UpToDate for funding source) Date Released: 2014       Wellness Office Visit  Subjective:  Patient ID: Sydney Taylor, female    DOB: 01-07-1966  Age: 54 y.o. MRN: [id number]  CC: This is an audio video telemedicine visit with the patient who is at home and I am in my office.  I was able to easily identify her on video and confirmed her with 2 identifiers. This is a follow-up visit regarding her uncontrolled diabetes. HPI She had gone to the emergency room with hyperglycemia, weakness, blurred vision.  Her blood glucose was 484.  She was started on glipizide 5 mg daily and her blood glucose levels now are typically below 400 but still above 300.  She does feel slightly better although still somewhat nauseous. When I had seen her previously via telemedicine, she had complained of nausea and I prescribed some Zofran for her.  In retrospect, this may have been due to hypoglycemia also.  She tells me that since I saw her last, she has lost about 13 pounds by following diet excluding processed foods, added sugars.  Past Medical History:  Diagnosis Date  . Anemia   . Anxiety   . Arrhythmia   . Bipolar depression ([place]) 12/28/2019  . Chronic headaches   . Depression   . Diabetes mellitus without complication ([place])   . Fibromyalgia   . GERD (gastroesophageal reflux disease)   . HLD (hyperlipidemia)   . Hypertension   . Insect bite   . Prediabetes   . Sleep apnea   . Urinary incontinence       Family History  Problem Relation Age of Onset  . Hypertension Father   . Hyperlipidemia Father   . Diabetes Father   . Hypertension Mother   . Hyperlipidemia Mother   . Diabetes Mellitus II Mother   . Congestive Heart Failure Mother   . Hypertension Sister    . Diabetes Sister   . Hypertension Brother   . Diabetes Maternal Grandmother   . Lupus Paternal Grandmother     Social History   Social History Narrative   Married for 3 years.Lives with husband.Bachelors in Education officer, museum.   Social History   Tobacco Use  . Smoking status: Former Smoker    Types: Cigarettes    Quit date: 12/30/1982    Years since quitting: 37.2  . Smokeless tobacco: Never Used  Substance Use Topics  . Alcohol use: Not Currently    Alcohol/week: 0.0 standard drinks    Current Meds  Medication Sig  . amitriptyline (ELAVIL) 25 MG tablet Take 37.5 mg by mouth at bedtime.  . chlorthalidone (HYGROTON) 50 MG tablet Take 1 tablet (50 mg total) by mouth daily.  . Cholecalciferol (VITAMIN D-3) 125 MCG (5000 UT) TABS Take 2 tablets by mouth daily.  Marland Kitchen glipiZIDE (GLUCOTROL) 5 MG tablet Take 1 tablet (5 mg total) by mouth daily before breakfast.  . ibuprofen (ADVIL) 200 MG tablet Take 200 mg by mouth every 6 (six) hours as needed.  . lamoTRIgine (LAMICTAL) 100 MG tablet Take 100 mg by mouth 2 (two) times daily.   . metoprolol tartrate (LOPRESSOR) 50 MG tablet Take 50 mg by mouth 2 (two) times daily.   . Potassium Chloride ER 20 MEQ TBCR Take 1 tablet  by mouth daily.  . potassium chloride SA (KLOR-CON) 20 MEQ tablet Take 1 tablet (20 mEq total) by mouth daily.  Marland Kitchen REXULTI 2 MG TABS tablet Take 2 mg by mouth daily.       Objective:   Today's Vitals: LMP  (LMP Unknown)  Vitals with BMI 03/20/2020 03/17/2020 03/17/2020  Height (No Data) - -  Weight (No Data) - -  BMI - - -  Systolic (No Data) [id number]  Diastolic (No Data) 71 80  Pulse - 107 106     Physical Exam  She appears alert and orientated on video.     Assessment   1. Type 2 diabetes mellitus with hyperosmolarity without coma, without long-term current use of insulin ([facility])       Tests ordered No orders of the defined types were placed in this encounter.    Plan: 1. Her blood glucose levels are still  elevated so I recommended that she increase the glipizide to 5 mg twice a day. 2. I have also recommended that she drink at least 1 gallon of water daily. 3. She will continue to try to follow a healthier diet. 4. I have told her that if her blood glucose levels remain above 300 despite increasing the dose of glipizide, she should contact me.  Otherwise, I will see her on the next scheduled appointment in the office around the middle of April.   No orders of the defined types were placed in this encounter.   Doree Albee, MD

## 2020-03-22 ENCOUNTER — Telehealth (INDEPENDENT_AMBULATORY_CARE_PROVIDER_SITE_OTHER): Payer: Self-pay

## 2020-03-22 NOTE — Telephone Encounter (Signed)
Pt called stting since Friday her blood glucose has been running in high levels. This morning it is 350 . Pt would like to know what should she need to do at this time. Pt has taken all  medicine  a directed.

## 2020-03-22 NOTE — Telephone Encounter (Signed)
done

## 2020-03-22 NOTE — Telephone Encounter (Signed)
Return call to Pt. To give instructions on the INCREASE  Glipizide 2 tablets Twice a day. This is a total of 10 mg Twice a day.Pt will start this today & focus on nutrition.

## 2020-03-22 NOTE — Telephone Encounter (Signed)
I would recommend that she increase the glipizide further so that she is taking 2 tablets twice a day for total dose of 10 mg twice a day.  Focus on nutrition as before.

## 2020-03-23 ENCOUNTER — Telehealth (INDEPENDENT_AMBULATORY_CARE_PROVIDER_SITE_OTHER): Payer: Self-pay

## 2020-03-23 MED ORDER — GLIPIZIDE 5 MG PO TABS: 10.0000 mg | ORAL_TABLET | Freq: Two times a day (BID) | ORAL | 0 refills | Status: DC

## 2020-03-23 NOTE — Telephone Encounter (Signed)
Sydney Taylor, CMA  

## 2020-03-23 NOTE — Telephone Encounter (Signed)
Greysi will keep up with her readings over the weekend and she will see Judson Roch on Monday

## 2020-03-23 NOTE — Telephone Encounter (Signed)
We increased the medicine just yesterday so she needs to give it over the weekend and call us back on Monday to tell us what the readings are.  Also, please schedule an appointment to see Sydney Taylor next week so she can address this also.

## 2020-03-23 NOTE — Telephone Encounter (Signed)
Patient is still having high blood glucose readings after increase in medication yesterday readings were  333 this morning 377 before dinner yesterday 420 before bedtime yesterday Please advise?

## 2020-03-27 ENCOUNTER — Other Ambulatory Visit (INDEPENDENT_AMBULATORY_CARE_PROVIDER_SITE_OTHER): Payer: Self-pay | Admitting: Nurse Practitioner

## 2020-03-27 ENCOUNTER — Ambulatory Visit (INDEPENDENT_AMBULATORY_CARE_PROVIDER_SITE_OTHER): Payer: No Typology Code available for payment source | Admitting: Nurse Practitioner

## 2020-03-27 ENCOUNTER — Other Ambulatory Visit: Payer: Self-pay

## 2020-03-27 VITALS — BP 120/90 | HR 89 | Temp 97.6°F | Ht 70.0 in | Wt 264.4 lb

## 2020-03-27 DIAGNOSIS — E876 Hypokalemia: Secondary | ICD-10-CM

## 2020-03-27 DIAGNOSIS — M62838 Other muscle spasm: Secondary | ICD-10-CM

## 2020-03-27 DIAGNOSIS — E1100 Type 2 diabetes mellitus with hyperosmolarity without nonketotic hyperglycemic-hyperosmolar coma (NKHHC): Secondary | ICD-10-CM

## 2020-03-27 DIAGNOSIS — E1165 Type 2 diabetes mellitus with hyperglycemia: Secondary | ICD-10-CM

## 2020-03-27 DIAGNOSIS — E119 Type 2 diabetes mellitus without complications: Secondary | ICD-10-CM | POA: Insufficient documentation

## 2020-03-27 MED ORDER — CYCLOBENZAPRINE HCL 5 MG PO TABS: 5.0000 mg | ORAL_TABLET | Freq: Three times a day (TID) | ORAL | 1 refills | Status: DC | PRN

## 2020-03-27 MED ORDER — GLIPIZIDE 10 MG PO TABS: 10.0000 mg | ORAL_TABLET | Freq: Two times a day (BID) | ORAL | 0 refills | Status: DC

## 2020-03-27 NOTE — Progress Notes (Signed)
Subjective:  Patient ID: Sydney Taylor, female    DOB: [birth year]  Age: 54 y.o. MRN: [id number]  CC:  Chief Complaint  Patient presents with  . high blood sugar readings  . Back Pain    upper back pain (pulled muscle)      HPI  This patient rested for the above.  Type 2 diabetes: She was evaluated in the emergency room on 03/17/2020 for complaints of weakness and blurred vision.  Her blood sugar was noted to be greater than 400.  She has been newly diagnosed with diabetes.  Per chart review I see that back in January of this year A1c was around 8.  She tells me in the past she has tried Metformin but could not tolerate it due to gastrointestinal upset.  She was started on glipizide 5 mg daily in the emergency department.  At home her blood sugars continue to be elevated and last week was increased to glipizide 10 mg twice a day.  She tells me she has been tolerating this medication fairly well but does complain of some constipation.  She is also been trying to fast approximately 15 hours a day and reducing her intake of processed foods.  She tells me she has had approximately 15 pound weight loss since doing this.  She does bring some logs of at home blood sugars.  They have been ranging from the 290s up to 400 over the last week.  I also note that when she was in emergency department her potassium was quite low at 2.9, and renal function was reduced.  She was started on potassium replacement.  She tells me she is been taking this as prescribed.  Shows me she has seen an eye doctor within the last year.    Back pain: She is also complaining of some upper back pain.  She tells me the pain is moderate in intensity.  Worsens with twisting and some on movements.  She does tell me she has been experiencing some weakness in her right lower leg as well as some numbness and tingling intermittently in both upper and lower extremities.  She tells me she will take Advil 800 mg up to 4 times a day as  needed.  She has had some pain relief using this.   Past Medical History:  Diagnosis Date  . Anemia   . Anxiety   . Arrhythmia   . Bipolar depression ([place]) 12/28/2019  . Chronic headaches   . Depression   . Diabetes mellitus without complication ([place])   . Fibromyalgia   . GERD (gastroesophageal reflux disease)   . HLD (hyperlipidemia)   . Hypertension   . Insect bite   . Prediabetes   . Sleep apnea   . Urinary incontinence       Family History  Problem Relation Age of Onset  . Hypertension Father   . Hyperlipidemia Father   . Diabetes Father   . Hypertension Mother   . Hyperlipidemia Mother   . Diabetes Mellitus II Mother   . Congestive Heart Failure Mother   . Hypertension Sister   . Diabetes Sister   . Hypertension Brother   . Diabetes Maternal Grandmother   . Lupus Paternal Grandmother     Social History   Social History Narrative   Married for 3 years.Lives with husband.Bachelors in Education officer, museum.   Social History   Tobacco Use  . Smoking status: Former Smoker    Types: Cigarettes    Quit date:  12/30/1982    Years since quitting: 37.2  . Smokeless tobacco: Never Used  Substance Use Topics  . Alcohol use: Not Currently    Alcohol/week: 0.0 standard drinks     Current Meds  Medication Sig  . amitriptyline (ELAVIL) 25 MG tablet Take 37.5 mg by mouth at bedtime.  . chlorthalidone (HYGROTON) 50 MG tablet Take 1 tablet (50 mg total) by mouth daily.  . Cholecalciferol (VITAMIN D-3) 125 MCG (5000 UT) TABS Take 2 tablets by mouth daily.  Marland Kitchen glipiZIDE (GLUCOTROL) 10 MG tablet Take 1 tablet (10 mg total) by mouth 2 (two) times daily before a meal.  . ibuprofen (ADVIL) 200 MG tablet Take 200 mg by mouth every 6 (six) hours as needed.  . lamoTRIgine (LAMICTAL) 100 MG tablet Take 100 mg by mouth 2 (two) times daily.   . metoprolol tartrate (LOPRESSOR) 50 MG tablet Take 50 mg by mouth 2 (two) times daily.   . Potassium Chloride ER 20 MEQ TBCR Take 1 tablet by mouth  daily.  Marland Kitchen REXULTI 2 MG TABS tablet Take 2 mg by mouth daily.  . [DISCONTINUED] glipiZIDE (GLUCOTROL) 5 MG tablet Take 2 tablets (10 mg total) by mouth 2 (two) times daily before a meal.    ROS:  Negative unless otherwise stated in HPI   Objective:   Today's Vitals: BP 120/90 (BP Location: Left Arm, Patient Position: Sitting, Cuff Size: Normal)   Pulse 89   Temp 97.6 F (36.4 C) (Temporal)   Ht _0  (1.778 m)   Wt 264 lb 6.4 oz (119.9 kg)   LMP  (LMP Unknown)   SpO2 93%   BMI 37.94 kg/m  Vitals with BMI 03/27/2020 03/20/2020 03/17/2020  Height _1  (No Data) -  Weight 264 lbs 6 oz (No Data) -  BMI 56.21 - -  Systolic 308 (No Data) 657  Diastolic 90 (No Data) 71  Pulse 89 - 107     Physical Exam Vitals reviewed.  Constitutional:      General: She is not in acute distress.    Appearance: Normal appearance.  HENT:     Head: Normocephalic and atraumatic.  Neck:     Vascular: No carotid bruit.  Cardiovascular:     Rate and Rhythm: Normal rate and regular rhythm.     Pulses: Normal pulses.     Heart sounds: Normal heart sounds.  Pulmonary:     Effort: Pulmonary effort is normal.     Breath sounds: Normal breath sounds.  Musculoskeletal:     Cervical back: Normal.     Thoracic back: Normal. No bony tenderness.     Lumbar back: Normal.  Skin:    General: Skin is warm and dry.  Neurological:     General: No focal deficit present.     Mental Status: She is alert and oriented to person, place, and time.     Sensory: Sensation is intact.     Motor: Motor function is intact. No weakness.     Coordination: Coordination is intact.     Gait: Gait is intact.     Deep Tendon Reflexes:     Reflex Scores:      Patellar reflexes are 2+ on the right side and 2+ on the left side. Psychiatric:        Mood and Affect: Mood normal.        Behavior: Behavior normal.        Judgment: Judgment normal.  Assessment and Plan   1. Type 2 diabetes mellitus with  hyperosmolarity without coma, without long-term current use of insulin ([place])   2. Muscle spasm   3. Type 2 diabetes mellitus with hyperglycemia, without long-term current use of insulin ([place])   4. Hypokalemia      Plan:  1.,  3., 4.  Discussed options regarding adding an additional agent to help control her blood sugars.  Per shared decision making, the patient would like to try a GLP-1 injectable.  Her husband is with her, and tells me there is a specific brand that is covered by their insurance.  He does not know the exact name right now, but will look this up and call me later this morning.  I will prescribe the agent they request once I hear from them.  I do not note any personal or family history of thyroid cancer.  Patient will follow up as scheduled in approximately 2 weeks, she was encouraged to call us if she experiences any hypoglycemic events.  I did find this for her as blood sugar less than 80 and or symptoms of hypoglycemia which include dizziness, lightheadedness, irritability, diaphoresis, tremors.  She tells me she understands.  I will also check metabolic panel to see if her renal function and potassium have improved and check an A1c.  As far as the fasting goes, I did encourage her to continue doing this.  I recommended that she take her first dose of glipizide with her first meal, and her last dose of glipizide with her last meal.  We also again discussed the importance of hydration, and I recommended that she hydrate regularly.  2.  Neuro exam intact and no bony tenderness along her spine.  I think she is most likely suffering from muscle spasm.  I encouraged her to continue using Advil as needed, but recommend that she take this every 8 hours and with a small meal.  I also recommend that she try taking Tylenol for additional pain relief.  We discussed possibly taking a steroid for a short amount of time, however with her current difficulties controlling her blood sugar I recommended  against this.  I will prescribe a muscle relaxer that she can take in addition to Advil and Tylenol, and recommended she also use heat and/or ice as needed.  I did tell her not to drive or operate heavy machinery while taking the muscle asked.  Tells me she understands.  She was encouraged to let me know if her symptoms worsen.  Tests ordered Orders Placed This Encounter  Procedures  . Hemoglobin A1c  . CMP with eGFR([facility])      Meds ordered this encounter  Medications  . glipiZIDE (GLUCOTROL) 10 MG tablet    Sig: Take 1 tablet (10 mg total) by mouth 2 (two) times daily before a meal.    Dispense:  180 tablet    Refill:  0    Order Specific Question:   Supervising Provider    Answer:   Hurshel Party C [[id number]]  . cyclobenzaprine (FLEXERIL) 5 MG tablet    Sig: Take 1 tablet (5 mg total) by mouth 3 (three) times daily as needed for muscle spasms.    Dispense:  30 tablet    Refill:  1    Order Specific Question:   Supervising Provider    Answer:   Doree Albee [[id number]]    Patient to follow-up in 2 weeks as scheduled or sooner as needed.  Crosby E  Pearline Cables, NP

## 2020-03-27 NOTE — Telephone Encounter (Signed)
Sydney Taylor, CMA  

## 2020-03-27 NOTE — Telephone Encounter (Signed)
Patient called back to say that her insurance should cover [facility], however it is not recommended to be given in an individual with estimated GFR less than 45.  At her last blood work results her EGFR was 44.  I do think this may be related to dehydration from hyperglycemia, thus I will await results of metabolic panel that was collected today before prescribing this.  Results should be available tomorrow.  I have called the patient to inform her of this, and I will touch base with her tomorrow.

## 2020-03-28 ENCOUNTER — Telehealth (INDEPENDENT_AMBULATORY_CARE_PROVIDER_SITE_OTHER): Payer: Self-pay | Admitting: Nurse Practitioner

## 2020-03-28 DIAGNOSIS — E876 Hypokalemia: Secondary | ICD-10-CM

## 2020-03-28 DIAGNOSIS — E1100 Type 2 diabetes mellitus with hyperosmolarity without nonketotic hyperglycemic-hyperosmolar coma (NKHHC): Secondary | ICD-10-CM

## 2020-03-28 LAB — HEMOGLOBIN A1C
Hgb A1c MFr Bld: 12.7 % of total Hgb — ABNORMAL HIGH (ref <5.7)
Mean Plasma Glucose: 318 (calc)
eAG (mmol/L): 17.6 (calc)

## 2020-03-28 LAB — COMPLETE METABOLIC PANEL WITH GFR
AG Ratio: 2 (calc) (ref 1.0–2.5)
ALT: 24 U/L (ref 6–29)
AST: 23 U/L (ref 10–35)
Albumin: 4.1 g/dL (ref 3.6–5.1)
Alkaline phosphatase (APISO): 78 U/L (ref 37–153)
BUN/Creatinine Ratio: 11 (calc) (ref 6–22)
BUN: 15 mg/dL (ref 7–25)
CO2: 30 mmol/L (ref 20–32)
Calcium: 9.6 mg/dL (ref 8.6–10.4)
Chloride: 94 mmol/L — ABNORMAL LOW (ref 98–110)
Creat: 1.31 mg/dL — ABNORMAL HIGH (ref 0.50–1.05)
GFR, Est African American: 53 mL/min/{1.73_m2} — ABNORMAL LOW (ref >=60)
GFR, Est Non African American: 46 mL/min/{1.73_m2} — ABNORMAL LOW (ref >=60)
Globulin: 2.1 g/dL (calc) (ref 1.9–3.7)
Glucose, Bld: 336 mg/dL — ABNORMAL HIGH (ref 65–99)
Potassium: 3.1 mmol/L — ABNORMAL LOW (ref 3.5–5.3)
Sodium: 135 mmol/L (ref 135–146)
Total Bilirubin: 0.7 mg/dL (ref 0.2–1.2)
Total Protein: 6.2 g/dL (ref 6.1–8.1)

## 2020-03-28 MED ORDER — BYDUREON 2 MG ~~LOC~~ PEN: 2.0000 mg | PEN_INJECTOR | SUBCUTANEOUS | 2 refills | Status: DC

## 2020-03-28 MED ORDER — POTASSIUM CHLORIDE ER 20 MEQ PO TBCR: 2.0000 | EXTENDED_RELEASE_TABLET | Freq: Every day | ORAL | 1 refills | Status: DC

## 2020-03-28 NOTE — Telephone Encounter (Signed)
I called this patient today to discuss her blood work results.  I also consulted with Dr. Anastasio Champion regarding her blood work results.  We will start her on Bydureon GLP1 weekly injection in addition to her glipizide.  I will also increase her potassium supplementation.  She will return to clinic in approximately 1 week to recheck metabolic panel, will need to consider stopping chlorthalidone and starting her on a different agent.  I encouraged her to focus on hydrating with water throughout the day in the meantime.  I also verified that she has no personal or family history of thyroid cancer or pancreatitis.  We did discuss common side effects as well as the black box warning for Bydureon, and she expressed understanding.

## 2020-04-06 ENCOUNTER — Encounter (INDEPENDENT_AMBULATORY_CARE_PROVIDER_SITE_OTHER): Payer: Self-pay | Admitting: Nurse Practitioner

## 2020-04-06 ENCOUNTER — Other Ambulatory Visit: Payer: Self-pay

## 2020-04-06 ENCOUNTER — Ambulatory Visit (INDEPENDENT_AMBULATORY_CARE_PROVIDER_SITE_OTHER): Payer: No Typology Code available for payment source | Admitting: Nurse Practitioner

## 2020-04-06 VITALS — BP 120/85 | HR 113 | Temp 97.8°F | Ht 70.0 in | Wt 258.8 lb

## 2020-04-06 DIAGNOSIS — H538 Other visual disturbances: Secondary | ICD-10-CM

## 2020-04-06 DIAGNOSIS — R2243 Localized swelling, mass and lump, lower limb, bilateral: Secondary | ICD-10-CM

## 2020-04-06 DIAGNOSIS — N179 Acute kidney failure, unspecified: Secondary | ICD-10-CM | POA: Diagnosis not present

## 2020-04-06 DIAGNOSIS — E1165 Type 2 diabetes mellitus with hyperglycemia: Secondary | ICD-10-CM

## 2020-04-06 LAB — COMPLETE METABOLIC PANEL WITH GFR
AG Ratio: 1.8 (calc) (ref 1.0–2.5)
ALT: 33 U/L — ABNORMAL HIGH (ref 6–29)
AST: 32 U/L (ref 10–35)
Albumin: 3.9 g/dL (ref 3.6–5.1)
Alkaline phosphatase (APISO): 70 U/L (ref 37–153)
BUN/Creatinine Ratio: 9 (calc) (ref 6–22)
BUN: 10 mg/dL (ref 7–25)
CO2: 29 mmol/L (ref 20–32)
Calcium: 10 mg/dL (ref 8.6–10.4)
Chloride: 96 mmol/L — ABNORMAL LOW (ref 98–110)
Creat: 1.15 mg/dL — ABNORMAL HIGH (ref 0.50–1.05)
GFR, Est African American: 62 mL/min/{1.73_m2} (ref >=60)
GFR, Est Non African American: 54 mL/min/{1.73_m2} — ABNORMAL LOW (ref >=60)
Globulin: 2.2 g/dL (calc) (ref 1.9–3.7)
Glucose, Bld: 237 mg/dL — ABNORMAL HIGH (ref 65–99)
Potassium: 3.2 mmol/L — ABNORMAL LOW (ref 3.5–5.3)
Sodium: 138 mmol/L (ref 135–146)
Total Bilirubin: 0.7 mg/dL (ref 0.2–1.2)
Total Protein: 6.1 g/dL (ref 6.1–8.1)

## 2020-04-06 NOTE — Progress Notes (Signed)
Subjective:  Patient ID: Sydney Taylor, female    DOB: August 13, 1966  Age: 54 y.o. MRN: [id number]  CC:  Chief Complaint  Patient presents with  . Diabetes  . Blurred Vision  . Foot Swelling  . Joint Swelling      HPI  This patient comes in today for the above.  She was evaluated in the emergency room on 03/17/2020 for complaints of weakness and blurred vision.  Her blood sugar was noted to be greater than 400.  She has been newly diagnosed with diabetes.  Per chart review I see that back in January of this year A1c was around 8. She was started on glipizide 5 mg daily in the emergency department.  At home her blood sugars continued to be elevated and she was told to increase glipizide to 10 mg twice a day.  At last office visit approximately 2 weeks ago A1c was repeated and it was 12.7.  At that point she was put on Bydureon GLP-1 agonist in addition to her glipizide.  She tells me she started taking this approximately 6 days ago.  So far she has tolerated it well and denies any abdominal upset. She is not on any Metformin because she has been unable to tolerate the side effects in the past.  She also does have a history of elevated blood pressure and is on chlorthalidone as well as potassium supplementation.  Kidney function has been reduced the last couple times metabolic panel has been checked.  She brings in a log of her at home blood sugars.  Prior to taking Bydureon her blood sugars are regularly in the 300s to 400s, since starting the Bydureon her sugars are starting to improve and range in the 170s to 200s generally.  She continues to experience some blurred vision bilaterally as well as swelling in her bilateral lower extremities.  The blurred vision is very concerning to her and she is concerned about possibly going blind.  She denies any total loss of vision, pain to either eye, double vision, or reduced peripheral vision.   Past Medical History:  Diagnosis Date  . Anemia   .  Anxiety   . Arrhythmia   . Bipolar depression ([place]) 12/28/2019  . Chronic headaches   . Depression   . Diabetes mellitus without complication ([place])   . Fibromyalgia   . GERD (gastroesophageal reflux disease)   . HLD (hyperlipidemia)   . Hypertension   . Insect bite   . Prediabetes   . Sleep apnea   . Urinary incontinence       Family History  Problem Relation Age of Onset  . Hypertension Father   . Hyperlipidemia Father   . Diabetes Father   . Hypertension Mother   . Hyperlipidemia Mother   . Diabetes Mellitus II Mother   . Congestive Heart Failure Mother   . Hypertension Sister   . Diabetes Sister   . Hypertension Brother   . Diabetes Maternal Grandmother   . Lupus Paternal Grandmother     Social History   Social History Narrative   Married for 3 years.Lives with husband.Bachelors in Education officer, museum.   Social History   Tobacco Use  . Smoking status: Former Smoker    Types: Cigarettes    Quit date: 12/30/1982    Years since quitting: 37.2  . Smokeless tobacco: Never Used  Substance Use Topics  . Alcohol use: Not Currently    Alcohol/week: 0.0 standard drinks     Current Meds  Medication Sig  . amitriptyline (ELAVIL) 25 MG tablet Take 37.5 mg by mouth at bedtime.  . chlorthalidone (HYGROTON) 50 MG tablet Take 1 tablet (50 mg total) by mouth daily.  . Cholecalciferol (VITAMIN D-3) 125 MCG (5000 UT) TABS Take 2 tablets by mouth daily.  . Exenatide ER (BYDUREON) 2 MG PEN Inject 2 mg into the skin once a week.  Marland Kitchen glipiZIDE (GLUCOTROL) 10 MG tablet Take 1 tablet (10 mg total) by mouth 2 (two) times daily before a meal.  . ibuprofen (ADVIL) 200 MG tablet Take 200 mg by mouth every 6 (six) hours as needed.  . lamoTRIgine (LAMICTAL) 100 MG tablet Take 100 mg by mouth 2 (two) times daily.   . metoprolol tartrate (LOPRESSOR) 50 MG tablet Take 50 mg by mouth 2 (two) times daily.   . Potassium Chloride ER 20 MEQ TBCR Take 2 tablets by mouth daily.  Marland Kitchen REXULTI 2 MG TABS tablet  Take 2 mg by mouth daily.    ROS:  Review of Systems  Constitutional: Negative for malaise/fatigue.  Eyes: Positive for blurred vision.  Respiratory: Negative for cough, shortness of breath and wheezing.   Cardiovascular: Positive for leg swelling. Negative for chest pain, orthopnea and PND.  Gastrointestinal: Negative for abdominal pain, diarrhea, nausea and vomiting.     Objective:   Today's Vitals: BP 120/85 (BP Location: Right Arm, Patient Position: Sitting, Cuff Size: Normal)   Pulse (!) 113   Temp 97.8 F (36.6 C) (Temporal)   Ht 5' 10"  (1.778 m)   Wt 258 lb 12.8 oz (117.4 kg)   LMP  (LMP Unknown)   SpO2 93%   BMI 37.13 kg/m  Vitals with BMI 04/06/2020 03/27/2020 03/20/2020  Height 5' 10"  5' 10"  (No Data)  Weight 258 lbs 13 oz 264 lbs 6 oz (No Data)  BMI 38.45 36.46 -  Systolic 803 212 (No Data)  Diastolic 85 90 (No Data)  Pulse 113 89 -     Physical Exam Vitals reviewed.  Constitutional:      General: She is not in acute distress.    Appearance: Normal appearance.  HENT:     Head: Normocephalic and atraumatic.  Eyes:     General: Lids are normal. Vision grossly intact. No visual field deficit.       Right eye: No foreign body.        Left eye: No foreign body.     Extraocular Movements: Extraocular movements intact.     Conjunctiva/sclera: Conjunctivae normal.     Pupils: Pupils are equal, round, and reactive to light.     Funduscopic exam:    Right eye: No hemorrhage or AV nicking. Red reflex present.        Left eye: No hemorrhage or AV nicking. Red reflex present.    Visual Fields: Right eye visual fields normal and left eye visual fields normal.  Neck:     Vascular: No carotid bruit.  Cardiovascular:     Rate and Rhythm: Normal rate and regular rhythm.     Pulses: Normal pulses.     Heart sounds: Normal heart sounds.  Pulmonary:     Effort: Pulmonary effort is normal.     Breath sounds: Normal breath sounds.  Musculoskeletal:     Right lower leg:  Edema present.     Left lower leg: Edema present.  Skin:    General: Skin is warm and dry.  Neurological:     General: No focal deficit present.  Mental Status: She is alert and oriented to person, place, and time.  Psychiatric:        Mood and Affect: Mood normal.        Behavior: Behavior normal.        Judgment: Judgment normal.          Assessment and Plan   1. Type 2 diabetes mellitus with hyperglycemia, without long-term current use of insulin ([place])   2. Blurry vision, bilateral   3. Localized swelling, mass, or lump of lower extremity, bilateral   4. Acute kidney injury ([place])      Plan: 1., 4.  At home logs of sugars show that her blood sugar is starting to improve.  For now I will keep her on her current regimen as I am seeing some improvement with first injection of Bydureon.  She was encouraged to continue keeping a log of her blood sugars at home until her next office visit.  I will get metabolic panel repeated today to monitor her kidney function.  If kidney function continues to show renal insufficiency we will probably stop her chlorthalidone and start her on a different agent.  2.  I have reassured her that her exam in the office today appears to be normal, and that I feel most probable etiology is related to her hyperglycemia.  However, she is quite concerned and thus I will refer her to an ophthalmologist for further evaluation.  3.  On exam I noticed minimal swelling, for now I have recommended that she use either diabetic socks or compression stockings for support.  I have low suspicion for heart failure, infection, or DVT, but I am concerned regarding renal function thus as stated above we will check a metabolic panel for further evaluation.   Tests ordered Orders Placed This Encounter  Procedures  . CMP with eGFR([facility])  . Ambulatory referral to Ophthalmology      No orders of the defined types were placed in this encounter.   Patient to  follow-up in 1 month  Ailene Ards, NP

## 2020-04-16 ENCOUNTER — Ambulatory Visit
Admission: EM | Admit: 2020-04-16 | Discharge: 2020-04-16 | Disposition: A | Discharge status: Home / Self Care | Payer: No Typology Code available for payment source | Attending: Emergency Medicine | Admitting: Emergency Medicine

## 2020-04-16 ENCOUNTER — Other Ambulatory Visit: Payer: Self-pay

## 2020-04-16 DIAGNOSIS — R059 Cough, unspecified: Secondary | ICD-10-CM

## 2020-04-16 DIAGNOSIS — R300 Dysuria: Secondary | ICD-10-CM | POA: Insufficient documentation

## 2020-04-16 DIAGNOSIS — R05 Cough: Secondary | ICD-10-CM | POA: Diagnosis not present

## 2020-04-16 DIAGNOSIS — Z20822 Contact with and (suspected) exposure to covid-19: Secondary | ICD-10-CM | POA: Insufficient documentation

## 2020-04-16 DIAGNOSIS — R3915 Urgency of urination: Secondary | ICD-10-CM | POA: Insufficient documentation

## 2020-04-16 DIAGNOSIS — R350 Frequency of micturition: Secondary | ICD-10-CM | POA: Insufficient documentation

## 2020-04-16 LAB — POCT URINALYSIS DIP (MANUAL ENTRY)
Bilirubin, UA: NEGATIVE
Glucose, UA: NEGATIVE mg/dL
Ketones, POC UA: NEGATIVE mg/dL
Nitrite, UA: NEGATIVE
Protein Ur, POC: NEGATIVE mg/dL
Spec Grav, UA: 1.01 (ref 1.010–1.025)
Urobilinogen, UA: 0.2 E.U./dL
pH, UA: 7 (ref 5.0–8.0)

## 2020-04-16 MED ORDER — NITROFURANTOIN MONOHYD MACRO 100 MG PO CAPS: 100.0000 mg | ORAL_CAPSULE | Freq: Two times a day (BID) | ORAL | 0 refills | Status: DC

## 2020-04-16 MED ORDER — PHENAZOPYRIDINE HCL 200 MG PO TABS: 200.0000 mg | ORAL_TABLET | Freq: Three times a day (TID) | ORAL | 0 refills | Status: DC

## 2020-04-16 NOTE — ED Provider Notes (Signed)
[facility]   [id number] 04/16/20 Arrival Time: 68   CC: COVID symptoms; UTI symtpoms  SUBJECTIVE: History from: patient.  Sydney Taylor is a 54 y.o. female who presents with mild dry cough x 3-4 days.  Denies sick exposure to COVID, flu or strep.  Denies alleviating or aggravating factors. Reports previous symptoms in the past.  Complains of body aches as well. Denies fever, chills, fatigue, sinus pain, rhinorrhea, sore throat, SOB, wheezing, chest pain, changes in bowel or bladder habits.    Patient also complains of dysuria, urinary frequency, and urinary urgency that began on 1 day ago.  Admits to recent sexual activity. Has not tried OTC medications.  Her symptoms are made worse with urination.  She reports similar symptoms in the past with UTI.  Complains of associated nausea.  She denies fever, chills, vomiting, abdominal pain, flank pain, hematuria, or incontinence   ROS: As per HPI.  All other pertinent ROS negative.     Past Medical History:  Diagnosis Date  . Anemia   . Anxiety   . Arrhythmia   . Bipolar depression ([place]) 12/28/2019  . Chronic headaches   . Depression   . Diabetes mellitus without complication ([place])   . Fibromyalgia   . GERD (gastroesophageal reflux disease)   . HLD (hyperlipidemia)   . Hypertension   . Insect bite   . Prediabetes   . Sleep apnea   . Urinary incontinence    Past Surgical History:  Procedure Laterality Date  . Joint fusion on foot Right   . NASAL SEPTUM SURGERY     No Known Allergies No current facility-administered medications on file prior to encounter.   Current Outpatient Medications on File Prior to Encounter  Medication Sig Dispense Refill  . amitriptyline (ELAVIL) 25 MG tablet Take 37.5 mg by mouth at bedtime.    . chlorthalidone (HYGROTON) 50 MG tablet Take 1 tablet (50 mg total) by mouth daily. 90 tablet 0  . Cholecalciferol (VITAMIN D-3) 125 MCG (5000 UT) TABS Take 2 tablets by mouth daily.    .  cyclobenzaprine (FLEXERIL) 5 MG tablet Take 1 tablet (5 mg total) by mouth 3 (three) times daily as needed for muscle spasms. (Patient not taking: Reported on 04/06/2020) 30 tablet 1  . Exenatide ER (BYDUREON) 2 MG PEN Inject 2 mg into the skin once a week. 4 each 2  . glipiZIDE (GLUCOTROL) 10 MG tablet Take 1 tablet (10 mg total) by mouth 2 (two) times daily before a meal. 180 tablet 0  . ibuprofen (ADVIL) 200 MG tablet Take 200 mg by mouth every 6 (six) hours as needed.    . lamoTRIgine (LAMICTAL) 100 MG tablet Take 100 mg by mouth 2 (two) times daily.     . metoprolol tartrate (LOPRESSOR) 50 MG tablet Take 50 mg by mouth 2 (two) times daily.     . Potassium Chloride ER 20 MEQ TBCR Take 2 tablets by mouth daily. 60 tablet 1  . REXULTI 2 MG TABS tablet Take 2 mg by mouth daily.    . [DISCONTINUED] promethazine (PHENERGAN) 25 MG tablet Take 1 tablet (25 mg total) by mouth every 12 (twelve) hours as needed for nausea or vomiting. 15 tablet 0   Social History   Socioeconomic History  . Marital status: Married    Spouse name: Not on file  . Number of children: 0  . Years of education: Not on file  . Highest education level: Not on file  Occupational History  .  Occupation: research associate/drug development  Tobacco Use  . Smoking status: Former Smoker    Types: Cigarettes    Quit date: 12/30/1982    Years since quitting: 37.3  . Smokeless tobacco: Never Used  Substance and Sexual Activity  . Alcohol use: Not Currently    Alcohol/week: 0.0 standard drinks  . Drug use: No  . Sexual activity: Never  Other Topics Concern  . Not on file  Social History Narrative   Married for 3 years.Lives with husband.Bachelors in Education officer, museum.   Social Determinants of Health   Financial Resource Strain:   . Difficulty of Paying Living Expenses:   Food Insecurity:   . Worried About Charity fundraiser in the Last Year:   . Arboriculturist in the Last Year:   Transportation Needs:   . Lexicographer (Medical):   Marland Kitchen Lack of Transportation (Non-Medical):   Physical Activity:   . Days of Exercise per Week:   . Minutes of Exercise per Session:   Stress:   . Feeling of Stress :   Social Connections:   . Frequency of Communication with Friends and Family:   . Frequency of Social Gatherings with Friends and Family:   . Attends Religious Services:   . Active Member of Clubs or Organizations:   . Attends Archivist Meetings:   Marland Kitchen Marital Status:   Intimate Partner Violence:   . Fear of Current or Ex-Partner:   . Emotionally Abused:   Marland Kitchen Physically Abused:   . Sexually Abused:    Family History  Problem Relation Age of Onset  . Hypertension Father   . Hyperlipidemia Father   . Diabetes Father   . Hypertension Mother   . Hyperlipidemia Mother   . Diabetes Mellitus II Mother   . Congestive Heart Failure Mother   . Hypertension Sister   . Diabetes Sister   . Hypertension Brother   . Diabetes Maternal Grandmother   . Lupus Paternal Grandmother     OBJECTIVE:  Vitals:   04/16/20 1058  BP: (!) 139/95  Pulse: (!) 115  Resp: 18  Temp: 98 F (36.7 C)  SpO2: 93%    General appearance: alert; appears mildly fatigued, but nontoxic; speaking in full sentences and tolerating own secretions HEENT: NCAT; Ears: EACs clear, TMs pearly gray; Eyes: PERRL.  EOM grossly intact. Nose: nares patent without rhinorrhea, Throat: oropharynx clear, tonsils non erythematous or enlarged, uvula midline  Neck: supple without LAD Lungs: unlabored respirations, symmetrical air entry; cough: absent; no respiratory distress; CTAB Heart: Tachycardia Back: no CVA tenderness Abdomen: soft, nondistended, normal active bowel sounds; nontender to palpation; no guarding  Skin: warm and dry Psychological: alert and cooperative; normal mood and affect  LABS:  Results for orders placed or performed during the hospital encounter of 04/16/20 (from the past 24 hour(s))  POCT urinalysis  dipstick     Status: Abnormal   Collection Time: 04/16/20 11:01 AM  Result Value Ref Range   Color, UA yellow yellow   Clarity, UA clear clear   Glucose, UA negative negative mg/dL   Bilirubin, UA negative negative   Ketones, POC UA negative negative mg/dL   Spec Grav, UA 1.010 1.010 - 1.025   Blood, UA large (A) negative   pH, UA 7.0 5.0 - 8.0   Protein Ur, POC negative negative mg/dL   Urobilinogen, UA 0.2 0.2 or 1.0 E.U./dL   Nitrite, UA Negative Negative   Leukocytes, UA Small (1+) (A) Negative  ASSESSMENT & PLAN:  1. Cough   2. Suspected COVID-19 virus infection   3. Dysuria   4. Urinary frequency   5. Urinary urgency     Meds ordered this encounter  Medications  . nitrofurantoin, macrocrystal-monohydrate, (MACROBID) 100 MG capsule    Sig: Take 1 capsule (100 mg total) by mouth 2 (two) times daily.    Dispense:  10 capsule    Refill:  0    Order Specific Question:   Supervising Provider    Answer:   Raylene Everts [id number]  . phenazopyridine (PYRIDIUM) 200 MG tablet    Sig: Take 1 tablet (200 mg total) by mouth 3 (three) times daily.    Dispense:  6 tablet    Refill:  0    Order Specific Question:   Supervising Provider    Answer:   Raylene Everts [id number]   Cough:  COVID testing ordered.  It will take between 5-7 days for test results.  Someone will contact you regarding abnormal results.    In the meantime: You should remain isolated in your home for 10 days from symptom onset AND greater than 72 hours after symptoms resolution (absence of fever without the use of fever-reducing medication and improvement in respiratory symptoms), whichever is longer Get plenty of rest and push fluids Use OTC zyrtec for nasal congestion, runny nose, and/or sore throat Use OTC flonase for nasal congestion and runny nose Use medications daily for symptom relief Use OTC medications like ibuprofen or tylenol as needed fever or pain Call or go to the ED if you have  any new or worsening symptoms such as fever, worsening cough, shortness of breath, chest tightness, chest pain, turning blue, changes in mental status, etc...   Burning with urination: Urine concerning for infection Urine culture sent.  We will call you with abnormal results.   Push fluids and get plenty of rest.   Take antibiotic as directed and to completion Take pyridium as prescribed and as needed for symptomatic relief Follow up with PCP if symptoms persists Return here or go to ER if you have any new or worsening symptoms such as fever, worsening abdominal pain, nausea/vomiting, flank pain  Reviewed expectations re: course of current medical issues. Questions answered. Outlined signs and symptoms indicating need for more acute intervention. Patient verbalized understanding. After Visit Summary given.         Lestine Box, PA-C 04/16/20 1107

## 2020-04-16 NOTE — ED Triage Notes (Signed)
Pt presents with c/o dysuria  for past 3 days and cough for about 5 days and would like covid test

## 2020-04-16 NOTE — Discharge Instructions (Addendum)
Cough:  COVID testing ordered.  It will take between 5-7 days for test results.  Someone will contact you regarding abnormal results.    In the meantime: You should remain isolated in your home for 10 days from symptom onset AND greater than 72 hours after symptoms resolution (absence of fever without the use of fever-reducing medication and improvement in respiratory symptoms), whichever is longer Get plenty of rest and push fluids Use OTC zyrtec for nasal congestion, runny nose, and/or sore throat Use OTC flonase for nasal congestion and runny nose Use medications daily for symptom relief Use OTC medications like ibuprofen or tylenol as needed fever or pain Call or go to the ED if you have any new or worsening symptoms such as fever, worsening cough, shortness of breath, chest tightness, chest pain, turning blue, changes in mental status, etc...   Burning with urination: Urine concerning for infection Urine culture sent.  We will call you with abnormal results.   Push fluids and get plenty of rest.   Take antibiotic as directed and to completion Take pyridium as prescribed and as needed for symptomatic relief Follow up with PCP if symptoms persists Return here or go to ER if you have any new or worsening symptoms such as fever, worsening abdominal pain, nausea/vomiting, flank pain

## 2020-04-17 ENCOUNTER — Ambulatory Visit (INDEPENDENT_AMBULATORY_CARE_PROVIDER_SITE_OTHER): Payer: No Typology Code available for payment source | Admitting: Internal Medicine

## 2020-04-17 LAB — SARS-COV-2, NAA 2 DAY TAT

## 2020-04-17 LAB — NOVEL CORONAVIRUS, NAA: SARS-CoV-2, NAA: NOT DETECTED

## 2020-04-18 ENCOUNTER — Encounter: Payer: Self-pay | Admitting: Emergency Medicine

## 2020-04-18 ENCOUNTER — Ambulatory Visit
Admission: EM | Admit: 2020-04-18 | Discharge: 2020-04-18 | Disposition: A | Discharge status: Home / Self Care | Payer: No Typology Code available for payment source

## 2020-04-18 ENCOUNTER — Other Ambulatory Visit: Payer: Self-pay

## 2020-04-18 NOTE — ED Triage Notes (Signed)
Pt here for wound check to abd area where she sts she injected insulin recently; pt sts area is red and painful

## 2020-04-19 ENCOUNTER — Telehealth (HOSPITAL_COMMUNITY): Payer: Self-pay

## 2020-04-19 LAB — URINE CULTURE: Culture: >=100000 — AB

## 2020-04-19 MED ORDER — CEPHALEXIN 500 MG PO CAPS: 500.0000 mg | ORAL_CAPSULE | Freq: Two times a day (BID) | ORAL | 0 refills | Status: AC

## 2020-04-20 ENCOUNTER — Encounter (INDEPENDENT_AMBULATORY_CARE_PROVIDER_SITE_OTHER): Payer: Self-pay | Admitting: Nurse Practitioner

## 2020-04-21 MED ORDER — FLUCONAZOLE 200 MG PO TABS: ORAL_TABLET | ORAL | 0 refills | Status: DC

## 2020-04-24 ENCOUNTER — Other Ambulatory Visit: Payer: Self-pay

## 2020-04-24 ENCOUNTER — Ambulatory Visit
Admission: EM | Admit: 2020-04-24 | Discharge: 2020-04-24 | Disposition: A | Discharge status: Home / Self Care | Payer: No Typology Code available for payment source | Attending: Emergency Medicine | Admitting: Emergency Medicine

## 2020-04-24 DIAGNOSIS — R220 Localized swelling, mass and lump, head: Secondary | ICD-10-CM | POA: Diagnosis present

## 2020-04-24 DIAGNOSIS — R21 Rash and other nonspecific skin eruption: Secondary | ICD-10-CM | POA: Insufficient documentation

## 2020-04-24 LAB — POCT URINALYSIS DIP (MANUAL ENTRY)
Bilirubin, UA: NEGATIVE
Blood, UA: NEGATIVE
Glucose, UA: NEGATIVE mg/dL
Ketones, POC UA: NEGATIVE mg/dL
Leukocytes, UA: NEGATIVE
Nitrite, UA: NEGATIVE
Protein Ur, POC: NEGATIVE mg/dL
Spec Grav, UA: 1.01 (ref 1.010–1.025)
Urobilinogen, UA: 0.2 E.U./dL
pH, UA: 6.5 (ref 5.0–8.0)

## 2020-04-24 MED ORDER — PREDNISONE 20 MG PO TABS: 20.0000 mg | ORAL_TABLET | Freq: Every day | ORAL | 0 refills | Status: DC

## 2020-04-24 MED ORDER — METHYLPREDNISOLONE SODIUM SUCC 125 MG IJ SOLR
80.0000 mg | Freq: Once | INTRAMUSCULAR | Status: AC
  Administered 2020-04-24: 19:00:00 80 mg via INTRAMUSCULAR

## 2020-04-24 NOTE — ED Triage Notes (Signed)
Pt developed rash and swelling on face yesterday . Pt was unsure if caused by antibiotics. Taken benadryl twice today

## 2020-04-24 NOTE — ED Provider Notes (Signed)
[place]   [id number] 04/24/20 Arrival Time: [id number]  CC: rash  SUBJECTIVE:  Treya Alter is a 54 y.o. female who presents with a rash, itching, and facial swelling x 1 day.  Symptoms began 4 days after starting keflex.  Localizes the symptoms to face and forearms.  Describes it as red, swelling, and itching.  Has tried OTC benadryl with relief.  Symptoms are made worse with itching.  Denies fever, chills, nausea, vomiting, discharge, oral lesions, oral swelling, trouble breathing, difficulty swallowing, SOB, chest pain, abdominal pain, changes in bowel or bladder function.    ROS: As per HPI.  All other pertinent ROS negative.     Past Medical History:  Diagnosis Date  . Anemia   . Anxiety   . Arrhythmia   . Bipolar depression ([place]) 12/28/2019  . Chronic headaches   . Depression   . Diabetes mellitus without complication ([place])   . Fibromyalgia   . GERD (gastroesophageal reflux disease)   . HLD (hyperlipidemia)   . Hypertension   . Insect bite   . Prediabetes   . Sleep apnea   . Urinary incontinence    Past Surgical History:  Procedure Laterality Date  . Joint fusion on foot Right   . NASAL SEPTUM SURGERY     No Known Allergies No current facility-administered medications on file prior to encounter.   Current Outpatient Medications on File Prior to Encounter  Medication Sig Dispense Refill  . amitriptyline (ELAVIL) 25 MG tablet Take 37.5 mg by mouth at bedtime.    . cephALEXin (KEFLEX) 500 MG capsule Take 1 capsule (500 mg total) by mouth 2 (two) times daily for 7 days. 14 capsule 0  . chlorthalidone (HYGROTON) 50 MG tablet Take 1 tablet (50 mg total) by mouth daily. 90 tablet 0  . Cholecalciferol (VITAMIN D-3) 125 MCG (5000 UT) TABS Take 2 tablets by mouth daily.    . cyclobenzaprine (FLEXERIL) 5 MG tablet Take 1 tablet (5 mg total) by mouth 3 (three) times daily as needed for muscle spasms. (Patient not taking: Reported on 04/06/2020) 30 tablet 1  .  Exenatide ER (BYDUREON) 2 MG PEN Inject 2 mg into the skin once a week. 4 each 2  . fluconazole (DIFLUCAN) 200 MG tablet Take first dose now, if no improvement may take 2nd dose in 72 hrs 2 tablet 0  . glipiZIDE (GLUCOTROL) 10 MG tablet Take 1 tablet (10 mg total) by mouth 2 (two) times daily before a meal. 180 tablet 0  . ibuprofen (ADVIL) 200 MG tablet Take 200 mg by mouth every 6 (six) hours as needed.    . lamoTRIgine (LAMICTAL) 100 MG tablet Take 100 mg by mouth 2 (two) times daily.     . metoprolol tartrate (LOPRESSOR) 50 MG tablet Take 50 mg by mouth 2 (two) times daily.     . nitrofurantoin, macrocrystal-monohydrate, (MACROBID) 100 MG capsule Take 1 capsule (100 mg total) by mouth 2 (two) times daily. 10 capsule 0  . phenazopyridine (PYRIDIUM) 200 MG tablet Take 1 tablet (200 mg total) by mouth 3 (three) times daily. 6 tablet 0  . Potassium Chloride ER 20 MEQ TBCR Take 2 tablets by mouth daily. 60 tablet 1  . REXULTI 2 MG TABS tablet Take 2 mg by mouth daily.    . [DISCONTINUED] promethazine (PHENERGAN) 25 MG tablet Take 1 tablet (25 mg total) by mouth every 12 (twelve) hours as needed for nausea or vomiting. 15 tablet 0   Social History  Socioeconomic History  . Marital status: Married    Spouse name: Not on file  . Number of children: 0  . Years of education: Not on file  . Highest education level: Not on file  Occupational History  . Occupation: research associate/drug development  Tobacco Use  . Smoking status: Former Smoker    Types: Cigarettes    Quit date: 12/30/1982    Years since quitting: 37.3  . Smokeless tobacco: Never Used  Substance and Sexual Activity  . Alcohol use: Not Currently    Alcohol/week: 0.0 standard drinks  . Drug use: No  . Sexual activity: Never  Other Topics Concern  . Not on file  Social History Narrative   Married for 3 years.Lives with husband.Bachelors in Education officer, museum.   Social Determinants of Health   Financial Resource Strain:   .  Difficulty of Paying Living Expenses:   Food Insecurity:   . Worried About Charity fundraiser in the Last Year:   . Arboriculturist in the Last Year:   Transportation Needs:   . Film/video editor (Medical):   Marland Kitchen Lack of Transportation (Non-Medical):   Physical Activity:   . Days of Exercise per Week:   . Minutes of Exercise per Session:   Stress:   . Feeling of Stress :   Social Connections:   . Frequency of Communication with Friends and Family:   . Frequency of Social Gatherings with Friends and Family:   . Attends Religious Services:   . Active Member of Clubs or Organizations:   . Attends Archivist Meetings:   Marland Kitchen Marital Status:   Intimate Partner Violence:   . Fear of Current or Ex-Partner:   . Emotionally Abused:   Marland Kitchen Physically Abused:   . Sexually Abused:    Family History  Problem Relation Age of Onset  . Hypertension Father   . Hyperlipidemia Father   . Diabetes Father   . Hypertension Mother   . Hyperlipidemia Mother   . Diabetes Mellitus II Mother   . Congestive Heart Failure Mother   . Hypertension Sister   . Diabetes Sister   . Hypertension Brother   . Diabetes Maternal Grandmother   . Lupus Paternal Grandmother     OBJECTIVE: Vitals:   04/24/20 1820  BP: (!) 137/91  Pulse: (!) 110  Resp: 18  Temp: 98.5 F (36.9 C)  SpO2: 95%    General appearance: alert; no distress, tolerating own secretions Head: NCAT ENT: face: with possible swelling and erythema over bilateral cheeks; PERRL, EOMI grossly; nares patent; oropharynx clear Lungs: clear to auscultation bilaterally Heart: regular rate and rhythm.   Skin: warm and dry Psychological: alert and cooperative; normal mood and affect  ASSESSMENT & PLAN:  1. Rash and nonspecific skin eruption   2. Facial swelling     Meds ordered this encounter  Medications  . predniSONE (DELTASONE) 20 MG tablet    Sig: Take 1 tablet (20 mg total) by mouth daily with breakfast for 5 days.     Dispense:  5 tablet    Refill:  0    Order Specific Question:   Supervising Provider    Answer:   Raylene Everts [id number]  . methylPREDNISolone sodium succinate (SOLU-MEDROL) 125 mg/2 mL injection 80 mg   Urine clear today Culture sent.  We will call you with abnormal results Steroid shot given in office Prednisone prescribed.  Take as directed and to completion Return or go to the ER if  you have any new or worsening symptoms such as fever, chills, nausea, vomiting, redness, swelling, discharge, if symptoms do not improve with medications, etc...  Reviewed expectations re: course of current medical issues. Questions answered. Outlined signs and symptoms indicating need for more acute intervention. Patient verbalized understanding. After Visit Summary given.   Lestine Box, PA-C 04/24/20 1905

## 2020-04-24 NOTE — ED Notes (Signed)
Pt unable to finish antibiotics for UTI, will recollect urine

## 2020-04-24 NOTE — Discharge Instructions (Addendum)
Urine clear today Culture sent.  We will call you with abnormal results Steroid shot given in office Prednisone prescribed.  Take as directed and to completion Return or go to the ER if you have any new or worsening symptoms such as fever, chills, nausea, vomiting, redness, swelling, discharge, if symptoms do not improve with medications, etc..Marland Kitchen

## 2020-04-25 ENCOUNTER — Encounter (INDEPENDENT_AMBULATORY_CARE_PROVIDER_SITE_OTHER): Payer: Self-pay | Admitting: Nurse Practitioner

## 2020-04-25 LAB — URINE CULTURE

## 2020-04-26 ENCOUNTER — Other Ambulatory Visit (INDEPENDENT_AMBULATORY_CARE_PROVIDER_SITE_OTHER): Payer: Self-pay | Admitting: Internal Medicine

## 2020-04-26 ENCOUNTER — Telehealth (INDEPENDENT_AMBULATORY_CARE_PROVIDER_SITE_OTHER): Payer: Self-pay | Admitting: Internal Medicine

## 2020-04-26 DIAGNOSIS — E1100 Type 2 diabetes mellitus with hyperosmolarity without nonketotic hyperglycemic-hyperosmolar coma (NKHHC): Secondary | ICD-10-CM

## 2020-04-26 DIAGNOSIS — E876 Hypokalemia: Secondary | ICD-10-CM

## 2020-04-26 MED ORDER — BYDUREON 2 MG ~~LOC~~ PEN: 2.0000 mg | PEN_INJECTOR | SUBCUTANEOUS | 2 refills | Status: DC

## 2020-04-27 ENCOUNTER — Ambulatory Visit (INDEPENDENT_AMBULATORY_CARE_PROVIDER_SITE_OTHER): Payer: No Typology Code available for payment source | Admitting: Internal Medicine

## 2020-04-27 ENCOUNTER — Other Ambulatory Visit (INDEPENDENT_AMBULATORY_CARE_PROVIDER_SITE_OTHER): Payer: Self-pay | Admitting: Internal Medicine

## 2020-04-27 ENCOUNTER — Other Ambulatory Visit: Payer: Self-pay

## 2020-04-27 ENCOUNTER — Encounter (INDEPENDENT_AMBULATORY_CARE_PROVIDER_SITE_OTHER): Payer: Self-pay | Admitting: Internal Medicine

## 2020-04-27 VITALS — BP 146/99 | Temp 96.3°F | Resp 18 | Ht 70.0 in | Wt 257.0 lb

## 2020-04-27 DIAGNOSIS — E876 Hypokalemia: Secondary | ICD-10-CM | POA: Diagnosis not present

## 2020-04-27 DIAGNOSIS — E1100 Type 2 diabetes mellitus with hyperosmolarity without nonketotic hyperglycemic-hyperosmolar coma (NKHHC): Secondary | ICD-10-CM

## 2020-04-27 MED ORDER — BYDUREON 2 MG ~~LOC~~ PEN: 2.0000 mg | PEN_INJECTOR | SUBCUTANEOUS | 2 refills | Status: DC

## 2020-04-27 MED ORDER — JARDIANCE 25 MG PO TABS: 25.0000 mg | ORAL_TABLET | Freq: Every day | ORAL | 3 refills | Status: DC

## 2020-04-27 NOTE — Patient Instructions (Signed)
[facility] Dietary Recommendations for Weight Loss What to Avoid . Avoid added sugars o Often added sugar can be found in processed foods such as many condiments, dry cereals, cakes, cookies, chips, crisps, crackers, candies, sweetened drinks, etc.  o Read labels and AVOID/DECREASE use of foods with the following in their ingredient list: Sugar, fructose, high fructose corn syrup, sucrose, glucose, maltose, dextrose, molasses, cane sugar, brown sugar, any type of syrup, agave nectar, etc.   . Avoid snacking in between meals . Avoid foods made with flour o If you are going to eat food made with flour, choose those made with whole-grains; and, minimize your consumption as much as is tolerable . Avoid processed foods o These foods are generally stocked in the middle of the grocery store. Focus on shopping on the perimeter of the grocery.  . Avoid Meat  o We recommend following a plant-based diet at [facility]. Thus, we recommend avoiding meat as a general rule. Consider eating beans, legumes, eggs, and/or dairy products for regular protein sources o If you plan on eating meat limit to 4 ounces of meat at a time and choose lean options such as Fish, chicken, turkey. Avoid red meat intake such as pork and/or steak What to Include . Vegetables o GREEN LEAFY VEGETABLES: Kale, spinach, mustard greens, collard greens, cabbage, broccoli, etc. o OTHER: Asparagus, cauliflower, eggplant, carrots, peas, Brussel sprouts, tomatoes, bell peppers, zucchini, beets, cucumbers, etc. . Grains, seeds, and legumes o Beans: kidney beans, black eyed peas, garbanzo beans, black beans, pinto beans, etc. o Whole, unrefined grains: brown rice, barley, bulgur, oatmeal, etc. . Healthy fats  o Avoid highly processed fats such as vegetable oil o Examples of healthy fats: avocado, olives, virgin olive oil, dark chocolate (?72% Cocoa), nuts (peanuts, almonds, walnuts, cashews, pecans, etc.) . None to Low  Intake of Animal Sources of Protein o Meat sources: chicken, turkey, salmon, tuna. Limit to 4 ounces of meat at one time. o Consider limiting dairy sources, but when choosing dairy focus on: PLAIN Greek yogurt, cottage cheese, high-protein milk . Fruit o Choose berries  When to Eat . Intermittent Fasting: o Choosing not to eat for a specific time period, but DO FOCUS ON HYDRATION when fasting o Multiple Techniques: - Time Restricted Eating: eat 3 meals in a day, each meal lasting no more than 60 minutes, no snacks between meals - 16-18 hour fast: fast for 16 to 18 hours up to 7 days a week. Often suggested to start with 2-3 nonconsecutive days per week.  . Remember the time you sleep is counted as fasting.  . Examples of eating schedule: Fast from 7:00pm-11:00am. Eat between 11:00am-7:00pm.  - 24-hour fast: fast for 24 hours up to every other day. Often suggested to start with 1 day per week . Remember the time you sleep is counted as fasting . Examples of eating schedule:  o Eating day: eat 2-3 meals on your eating day. If doing 2 meals, each meal should last no more than 90 minutes. If doing 3 meals, each meal should last no more than 60 minutes. Finish last meal by 7:00pm. o Fasting day: Fast until 7:00pm.  o IF YOU FEEL UNWELL FOR ANY REASON/IN ANY WAY WHEN FASTING, STOP FASTING BY EATING A NUTRITIOUS SNACK OR LIGHT MEAL o ALWAYS FOCUS ON HYDRATION DURING FASTS - Acceptable Hydration sources: water, broths, tea/coffee (black tea/coffee is best but using a small amount of whole-fat dairy products in coffee/tea is acceptable).  -   Poor Hydration Sources: anything with sugar or artificial sweeteners added to it  These recommendations have been developed for patients that are actively receiving medical care from either Dr. Jessamyn Watterson or Sarah Gray, DNP, NP-C at [facility]. These recommendations are developed for patients with specific medical conditions and are not meant to be  distributed or used by others that are not actively receiving care from either provider listed above at [facility]. It is not appropriate to participate in the above eating plans without proper medical supervision.   Reference: Fung, J. The obesity code. [place]: [place]; 2016.   

## 2020-04-27 NOTE — Progress Notes (Signed)
Metrics: Intervention Frequency ACO  Documented Smoking Status Yearly  Screened one or more times in 24 months  Cessation Counseling or  Active cessation medication Past 24 months  Past 24 months   Guideline developer: UpToDate (See UpToDate for funding source) Date Released: 2014       Wellness Office Visit  Subjective:  Patient ID: Sydney Taylor, female    DOB: [birth year]  Age: 54 y.o. MRN: [id number]  CC: This lady comes in for follow-up of uncontrolled diabetes, hypertension, morbid obesity. HPI The last time blood work was done, she was found to be hypokalemic and potassium has been increased in dose. She tells me her blood sugars still are running somewhat higher and variable.  She is also had a bilateral cheek rash which seems to be improving.  This apparently came on after she had been started on Keflex for  UTI.  Past Medical History:  Diagnosis Date  . Anemia   . Anxiety   . Arrhythmia   . Bipolar depression ([place]) 12/28/2019  . Chronic headaches   . Depression   . Diabetes mellitus without complication ([place])   . Fibromyalgia   . GERD (gastroesophageal reflux disease)   . HLD (hyperlipidemia)   . Hypertension   . Insect bite   . Prediabetes   . Sleep apnea   . Urinary incontinence       Family History  Problem Relation Age of Onset  . Hypertension Father   . Hyperlipidemia Father   . Diabetes Father   . Hypertension Mother   . Hyperlipidemia Mother   . Diabetes Mellitus II Mother   . Congestive Heart Failure Mother   . Hypertension Sister   . Diabetes Sister   . Hypertension Brother   . Diabetes Maternal Grandmother   . Lupus Paternal Grandmother     Social History   Social History Narrative   Married for 3 years.Lives with husband.Bachelors in Education officer, museum.   Social History   Tobacco Use  . Smoking status: Former Smoker    Types: Cigarettes    Quit date: 12/30/1982    Years since quitting: 37.3  . Smokeless tobacco: Never Used  Substance Use  Topics  . Alcohol use: Not Currently    Alcohol/week: 0.0 standard drinks    Current Meds  Medication Sig  . amitriptyline (ELAVIL) 25 MG tablet Take 37.5 mg by mouth at bedtime.  . chlorthalidone (HYGROTON) 50 MG tablet Take 1 tablet (50 mg total) by mouth daily.  . Cholecalciferol (VITAMIN D-3) 125 MCG (5000 UT) TABS Take 2 tablets by mouth daily.  . Exenatide ER (BYDUREON) 2 MG PEN Inject 2 mg into the skin once a week.  Marland Kitchen glipiZIDE (GLUCOTROL) 10 MG tablet Take 1 tablet (10 mg total) by mouth 2 (two) times daily before a meal.  . lamoTRIgine (LAMICTAL) 100 MG tablet Take 100 mg by mouth 2 (two) times daily.   . metoprolol tartrate (LOPRESSOR) 50 MG tablet Take 50 mg by mouth 2 (two) times daily.   . Potassium Chloride ER 20 MEQ TBCR Take 2 tablets by mouth daily.  Marland Kitchen REXULTI 2 MG TABS tablet Take 2 mg by mouth daily.  . [DISCONTINUED] ibuprofen (ADVIL) 200 MG tablet Take 200 mg by mouth every 6 (six) hours as needed.      Objective:   Today's Vitals: BP (!) 146/99 (BP Location: Right Arm, Patient Position: Sitting, Cuff Size: Normal)   Temp (!) 96.3 F (35.7 C) (Temporal)   Resp 18   Ht  5\' 10"  (1.778 m)   Wt 257 lb (116.6 kg)   LMP  (LMP Unknown)   BMI 36.88 kg/m  Vitals with BMI 04/27/2020 04/24/2020 04/18/2020  Height 5\' 10"  - -  Weight 257 lbs - -  BMI [id number] - -  Systolic [id number]  Diastolic 99 91 82  Pulse - 110 108     Physical Exam   General systemically well.  Her bili obese.  Blood pressure elevated somewhat today but previously has been borderline normal.  She is alert and orientated without any obvious focal neurological signs.    Assessment   1. Hypokalemia   2. Type 2 diabetes mellitus with hyperosmolarity without coma, without long-term current use of insulin ([place])   3. Morbid obesity ([place])       Tests ordered No orders of the defined types were placed in this encounter.    Plan: 1. I have told her that we are going to make a change in  her diabetic medications and she will continue taking the Bydureon but I wanted to discontinue glipizide as this will increase insulin levels and be counterproductive to the control of diabetes.  In its place, I am going to start her on Jardiance 25 mg daily. 2. We also discussed nutrition in more detail and we discussed the importance of intermittent fasting.  She is already vegetarian which is very helpful.  If she can fast for 16 hours every day, I think she will be more successful. 3. She will follow-up in about 2 months time with Judson Roch and be reviewed at that time when it will be time to check hemoglobin A1c also. 4. I spent 30 minutes with this patient discussing the importance of nutrition and also adjusting her diabetic medications.   Meds ordered this encounter  Medications  . empagliflozin (JARDIANCE) 25 MG TABS tablet    Sig: Take 25 mg by mouth daily before breakfast.    Dispense:  30 tablet    Refill:  3    Sydney Nicotra Luther Parody, MD

## 2020-04-28 ENCOUNTER — Ambulatory Visit
Admission: EM | Admit: 2020-04-28 | Discharge: 2020-04-28 | Disposition: A | Discharge status: Home / Self Care | Payer: No Typology Code available for payment source | Attending: Emergency Medicine | Admitting: Emergency Medicine

## 2020-04-28 ENCOUNTER — Other Ambulatory Visit: Payer: Self-pay

## 2020-04-28 DIAGNOSIS — R6889 Other general symptoms and signs: Secondary | ICD-10-CM | POA: Diagnosis not present

## 2020-04-28 DIAGNOSIS — Z20822 Contact with and (suspected) exposure to covid-19: Secondary | ICD-10-CM

## 2020-04-28 LAB — POCT INFLUENZA A/B
Influenza A, POC: NEGATIVE
Influenza B, POC: NEGATIVE

## 2020-04-28 MED ORDER — CETIRIZINE HCL 10 MG PO TABS: 10.0000 mg | ORAL_TABLET | Freq: Every day | ORAL | 0 refills | Status: DC

## 2020-04-28 MED ORDER — FLUTICASONE PROPIONATE 50 MCG/ACT NA SUSP: 2.0000 | Freq: Every day | NASAL | 0 refills | Status: DC

## 2020-04-28 NOTE — Discharge Instructions (Signed)
Decline antibiotic for possible sinus infection today Flu negative COVID testing ordered.  It will take between 2-5 days for test results.  Someone will contact you regarding abnormal results.    In the meantime: You should remain isolated in your home for 10 days from symptom onset AND greater than 72 hours after symptoms resolution (absence of fever without the use of fever-reducing medication and improvement in respiratory symptoms), whichever is longer Get plenty of rest and push fluids zyrtec for nasal congestion, runny nose, and/or sore throat flonase for nasal congestion and runny nose Use medications daily for symptom relief Use OTC medications like ibuprofen or tylenol as needed fever or pain Call or go to the ED if you have any new or worsening symptoms such as fever, cough, shortness of breath, chest tightness, chest pain, turning blue, changes in mental status, etc..Marland Kitchen

## 2020-04-28 NOTE — ED Triage Notes (Signed)
Pt presents with complaints of fatigue, runny nose, headache, body aches x 2 weeks that has gotten worse since Wednesday. Pt was tested for covid on 4/18 and it was negative.

## 2020-04-28 NOTE — ED Provider Notes (Signed)
[place]   [id number] 04/28/20 Arrival Time: [id number]   CC: COVID symptoms  SUBJECTIVE: History from: patient.  Sydney Taylor is a 54 y.o. female who presents with body aches, fatigue, sinus HA, and runny nose x 2 days.  Denies sick exposure to COVID, flu or strep.  Denies recent travel.  Has tried OTC tylenol with minimal relief.  Symptoms are made worse with activity.  Reports previous symptoms in the past that improved with steroid shot, but then reoccurred 2 days ago.   Denies fever, chills, sore throat, cough, SOB, wheezing, chest pain, nausea, vomiting, changes in bowel or bladder habits.    ROS: As per HPI.  All other pertinent ROS negative.     Past Medical History:  Diagnosis Date  . Anemia   . Anxiety   . Arrhythmia   . Bipolar depression ([place]) 12/28/2019  . Chronic headaches   . Depression   . Diabetes mellitus without complication ([place])   . Fibromyalgia   . GERD (gastroesophageal reflux disease)   . HLD (hyperlipidemia)   . Hypertension   . Insect bite   . Prediabetes   . Sleep apnea   . Urinary incontinence    Past Surgical History:  Procedure Laterality Date  . Joint fusion on foot Right   . NASAL SEPTUM SURGERY     No Known Allergies No current facility-administered medications on file prior to encounter.   Current Outpatient Medications on File Prior to Encounter  Medication Sig Dispense Refill  . amitriptyline (ELAVIL) 25 MG tablet Take 37.5 mg by mouth at bedtime.    . chlorthalidone (HYGROTON) 50 MG tablet Take 1 tablet (50 mg total) by mouth daily. 90 tablet 0  . Cholecalciferol (VITAMIN D-3) 125 MCG (5000 UT) TABS Take 2 tablets by mouth daily.    . empagliflozin (JARDIANCE) 25 MG TABS tablet Take 25 mg by mouth daily before breakfast. 30 tablet 3  . Exenatide ER (BYDUREON) 2 MG PEN Inject 2 mg into the skin once a week. 4 each 2  . glipiZIDE (GLUCOTROL) 10 MG tablet Take 1 tablet (10 mg total) by mouth 2 (two) times daily before a  meal. 180 tablet 0  . lamoTRIgine (LAMICTAL) 100 MG tablet Take 100 mg by mouth 2 (two) times daily.     . metoprolol tartrate (LOPRESSOR) 50 MG tablet Take 50 mg by mouth 2 (two) times daily.     . Potassium Chloride ER 20 MEQ TBCR Take 2 tablets by mouth daily. 60 tablet 1  . REXULTI 2 MG TABS tablet Take 2 mg by mouth daily.    . [DISCONTINUED] promethazine (PHENERGAN) 25 MG tablet Take 1 tablet (25 mg total) by mouth every 12 (twelve) hours as needed for nausea or vomiting. 15 tablet 0   Social History   Socioeconomic History  . Marital status: Married    Spouse name: Not on file  . Number of children: 0  . Years of education: Not on file  . Highest education level: Not on file  Occupational History  . Occupation: research associate/drug development  Tobacco Use  . Smoking status: Former Smoker    Types: Cigarettes    Quit date: 12/30/1982    Years since quitting: 37.3  . Smokeless tobacco: Never Used  Substance and Sexual Activity  . Alcohol use: Not Currently    Alcohol/week: 0.0 standard drinks  . Drug use: No  . Sexual activity: Never  Other Topics Concern  . Not on file  Social  History Narrative   Married for 3 years.Lives with husband.Bachelors in Education officer, museum.   Social Determinants of Health   Financial Resource Strain:   . Difficulty of Paying Living Expenses:   Food Insecurity:   . Worried About Charity fundraiser in the Last Year:   . Arboriculturist in the Last Year:   Transportation Needs:   . Film/video editor (Medical):   Marland Kitchen Lack of Transportation (Non-Medical):   Physical Activity:   . Days of Exercise per Week:   . Minutes of Exercise per Session:   Stress:   . Feeling of Stress :   Social Connections:   . Frequency of Communication with Friends and Family:   . Frequency of Social Gatherings with Friends and Family:   . Attends Religious Services:   . Active Member of Clubs or Organizations:   . Attends Archivist Meetings:   Marland Kitchen Marital  Status:   Intimate Partner Violence:   . Fear of Current or Ex-Partner:   . Emotionally Abused:   Marland Kitchen Physically Abused:   . Sexually Abused:    Family History  Problem Relation Age of Onset  . Hypertension Father   . Hyperlipidemia Father   . Diabetes Father   . Hypertension Mother   . Hyperlipidemia Mother   . Diabetes Mellitus II Mother   . Congestive Heart Failure Mother   . Hypertension Sister   . Diabetes Sister   . Hypertension Brother   . Diabetes Maternal Grandmother   . Lupus Paternal Grandmother     OBJECTIVE:  Vitals:   04/28/20 1712  BP: (!) 163/96  Pulse: (!) 132  Resp: 16  Temp: 99.1 F (37.3 C)  TempSrc: Oral  SpO2: 95%    Pulse improved to 115 bpm during examination  General appearance: alert; appears fatigued, but nontoxic; speaking in full sentences and tolerating own secretions HEENT: NCAT; Ears: EACs clear, TMs pearly gray; Eyes: PERRL.  EOM grossly intact. Sinuses: nontender; Nose: nares patent without rhinorrhea, Throat: oropharynx clear, tonsils non erythematous or enlarged, uvula midline  Neck: supple without LAD Lungs: unlabored respirations, symmetrical air entry; cough: absent; no respiratory distress; CTAB Heart: Tachycardic Skin: warm and dry Psychological: alert and cooperative; normal mood and affect  LABS:  Results for orders placed or performed during the hospital encounter of 04/28/20 (from the past 24 hour(s))  POCT Influenza A/B     Status: None   Collection Time: 04/28/20  5:40 PM  Result Value Ref Range   Influenza A, POC Negative Negative   Influenza B, POC Negative Negative     ASSESSMENT & PLAN:  1. Flu-like symptoms   2. Suspected COVID-19 virus infection     Meds ordered this encounter  Medications  . cetirizine (ZYRTEC) 10 MG tablet    Sig: Take 1 tablet (10 mg total) by mouth daily.    Dispense:  30 tablet    Refill:  0    Order Specific Question:   Supervising Provider    Answer:   Raylene Everts  [id number]  . fluticasone (FLONASE) 50 MCG/ACT nasal spray    Sig: Place 2 sprays into both nostrils daily.    Dispense:  16 g    Refill:  0    Order Specific Question:   Supervising Provider    Answer:   Raylene Everts [id number]   Decline antibiotic for possible sinus infection today Flu negative COVID testing ordered.  It will take between 2-5 days for  test results.  Someone will contact you regarding abnormal results.    In the meantime: You should remain isolated in your home for 10 days from symptom onset AND greater than 72 hours after symptoms resolution (absence of fever without the use of fever-reducing medication and improvement in respiratory symptoms), whichever is longer Get plenty of rest and push fluids zyrtec for nasal congestion, runny nose, and/or sore throat flonase for nasal congestion and runny nose Use medications daily for symptom relief Use OTC medications like ibuprofen or tylenol as needed fever or pain Call or go to the ED if you have any new or worsening symptoms such as fever, cough, shortness of breath, chest tightness, chest pain, turning blue, changes in mental status, etc...   Reviewed expectations re: course of current medical issues. Questions answered. Outlined signs and symptoms indicating need for more acute intervention. Patient verbalized understanding. After Visit Summary given.         Lestine Box, PA-C 04/28/20 1757

## 2020-04-29 LAB — NOVEL CORONAVIRUS, NAA: SARS-CoV-2, NAA: NOT DETECTED

## 2020-05-01 ENCOUNTER — Telehealth (INDEPENDENT_AMBULATORY_CARE_PROVIDER_SITE_OTHER): Payer: Self-pay

## 2020-05-01 ENCOUNTER — Other Ambulatory Visit (INDEPENDENT_AMBULATORY_CARE_PROVIDER_SITE_OTHER): Payer: Self-pay | Admitting: Internal Medicine

## 2020-05-01 MED ORDER — BYDUREON BCISE 2 MG/0.85ML ~~LOC~~ AUIJ: 2.0000 mg | AUTO-INJECTOR | SUBCUTANEOUS | 3 refills | Status: DC

## 2020-05-01 NOTE — Telephone Encounter (Signed)
Sydney Taylor, CMA  

## 2020-05-02 ENCOUNTER — Emergency Department (HOSPITAL_COMMUNITY)
Admission: EM | Admit: 2020-05-02 | Discharge: 2020-05-02 | Disposition: A | Discharge status: Home / Self Care | Payer: No Typology Code available for payment source | Attending: Emergency Medicine | Admitting: Emergency Medicine

## 2020-05-02 ENCOUNTER — Emergency Department (HOSPITAL_COMMUNITY): Payer: No Typology Code available for payment source

## 2020-05-02 ENCOUNTER — Other Ambulatory Visit: Payer: Self-pay

## 2020-05-02 ENCOUNTER — Encounter (HOSPITAL_COMMUNITY): Payer: Self-pay | Admitting: *Deleted

## 2020-05-02 DIAGNOSIS — I10 Essential (primary) hypertension: Secondary | ICD-10-CM | POA: Insufficient documentation

## 2020-05-02 DIAGNOSIS — E876 Hypokalemia: Secondary | ICD-10-CM | POA: Insufficient documentation

## 2020-05-02 DIAGNOSIS — E119 Type 2 diabetes mellitus without complications: Secondary | ICD-10-CM | POA: Diagnosis not present

## 2020-05-02 DIAGNOSIS — Z794 Long term (current) use of insulin: Secondary | ICD-10-CM | POA: Diagnosis not present

## 2020-05-02 DIAGNOSIS — Z79899 Other long term (current) drug therapy: Secondary | ICD-10-CM | POA: Diagnosis not present

## 2020-05-02 DIAGNOSIS — R1310 Dysphagia, unspecified: Secondary | ICD-10-CM | POA: Diagnosis present

## 2020-05-02 DIAGNOSIS — Z87891 Personal history of nicotine dependence: Secondary | ICD-10-CM | POA: Insufficient documentation

## 2020-05-02 LAB — CBC WITH DIFFERENTIAL/PLATELET
Abs Immature Granulocytes: 0.13 10*3/uL — ABNORMAL HIGH (ref 0.00–0.07)
Basophils Absolute: 0.1 10*3/uL (ref 0.0–0.1)
Basophils Relative: 1 %
Eosinophils Absolute: 0.3 10*3/uL (ref 0.0–0.5)
Eosinophils Relative: 3 %
HCT: 45.3 % (ref 36.0–46.0)
Hemoglobin: 15.2 g/dL — ABNORMAL HIGH (ref 12.0–15.0)
Immature Granulocytes: 1 %
Lymphocytes Relative: 21 %
Lymphs Abs: 2.7 10*3/uL (ref 0.7–4.0)
MCH: 31.1 pg (ref 26.0–34.0)
MCHC: 33.6 g/dL (ref 30.0–36.0)
MCV: 92.8 fL (ref 80.0–100.0)
Monocytes Absolute: 1 10*3/uL (ref 0.1–1.0)
Monocytes Relative: 8 %
Neutro Abs: 8.8 10*3/uL — ABNORMAL HIGH (ref 1.7–7.7)
Neutrophils Relative %: 66 %
Platelets: 308 10*3/uL (ref 150–400)
RBC: 4.88 MIL/uL (ref 3.87–5.11)
RDW: 13.2 % (ref 11.5–15.5)
WBC: 13.1 10*3/uL — ABNORMAL HIGH (ref 4.0–10.5)
nRBC: 0 % (ref 0.0–0.2)

## 2020-05-02 LAB — BASIC METABOLIC PANEL
Anion gap: 16 — ABNORMAL HIGH (ref 5–15)
BUN: 13 mg/dL (ref 6–20)
CO2: 24 mmol/L (ref 22–32)
Calcium: 9.7 mg/dL (ref 8.9–10.3)
Chloride: 95 mmol/L — ABNORMAL LOW (ref 98–111)
Creatinine, Ser: 1.08 mg/dL — ABNORMAL HIGH (ref 0.44–1.00)
GFR calc Af Amer: >60 mL/min (ref >60)
GFR calc non Af Amer: 58 mL/min — ABNORMAL LOW (ref >60)
Glucose, Bld: 127 mg/dL — ABNORMAL HIGH (ref 70–99)
Potassium: 2.8 mmol/L — ABNORMAL LOW (ref 3.5–5.1)
Sodium: 135 mmol/L (ref 135–145)

## 2020-05-02 MED ORDER — POTASSIUM CHLORIDE 10 MEQ/100ML IV SOLN
10.0000 meq | Freq: Once | INTRAVENOUS | Status: AC
  Administered 2020-05-02: 10 meq via INTRAVENOUS
  Filled 2020-05-02: qty 100

## 2020-05-02 MED ORDER — SODIUM CHLORIDE 0.9 % IV BOLUS
1000.0000 mL | Freq: Once | INTRAVENOUS | Status: AC
  Administered 2020-05-02: 1000 mL via INTRAVENOUS

## 2020-05-02 MED ORDER — IOHEXOL 300 MG/ML  SOLN
75.0000 mL | Freq: Once | INTRAMUSCULAR | Status: AC | PRN
  Administered 2020-05-02: 18:00:00 75 mL via INTRAVENOUS

## 2020-05-02 NOTE — Discharge Instructions (Signed)
Your labwork showed a lot potassium today at 2.8. Please continue taking your potassium supplements at home. You will need to have your potassium level rechecked by your PCP in 1-2 weeks  Please call Dr. Oneida Alar office to schedule an appointment to establish care given your symptoms of pills getting stuck in your throat.   Return to the ED immediately for any worsening symptoms including specifically sensation of food in your throat, shortness of breath, inability to tolerate your own saliva, fevers > 100.4

## 2020-05-02 NOTE — ED Triage Notes (Signed)
Pt c/o difficulty swallowing medications for the past 2 weeks and difficulty swallowing food the past 2 days. Pt reports she took a pill a couple of hours ago and feels as if it is stuck in her chest.   Pt also c/o red, itchy rash to face and bilateral arms.

## 2020-05-02 NOTE — ED Provider Notes (Signed)
[facility] EMERGENCY DEPARTMENT Provider Note   CSN: [id number] Arrival date & time: 05/02/20  1402     History Chief Complaint  Patient presents with  . Dysphagia  . Rash    Sydney Taylor is a 54 y.o. female with PMHx HTN, HLD, fibromyalgia, GERD, diabetes, depression, anxiety, anemia, sleep apnea who presents to the ED today with complaints of dysphagia to solids x 2 weeks. Pt reports she first noticed it when taking her regular pills 2 weeks ago. She states she feels as if they will get stuck in her throat but then dissolve on their own. She did not think much of it until she noticed food getting stuck 2 days ago. She denies any current sensation of FB in her throat. She denies any difficulty swallowing liquids. No sore throat, voice change, drooling, fevers, chills, chest pain, abdominal pain, nausea, vomiting, or any other associated symptoms. No previous issues with swallowing in the past. No previous EGD or esophageal manometry. Pt reports issues with GERD however is not on any medications.   Pt also complains of an itchy red rash to her bilateral cheeks since being started on keflex 2 weeks ago for a UTI. Stopped taking the keflex 4 days in and has already been evaluated at [facility] for the rash. Reports it is improving.   The history is provided by the patient and medical records.       Past Medical History:  Diagnosis Date  . Anemia   . Anxiety   . Arrhythmia   . Bipolar depression ([place]) 12/28/2019  . Chronic headaches   . Depression   . Diabetes mellitus without complication ([place])   . Fibromyalgia   . GERD (gastroesophageal reflux disease)   . HLD (hyperlipidemia)   . Hypertension   . Insect bite   . Prediabetes   . Sleep apnea   . Urinary incontinence     Patient Active Problem List   Diagnosis Date Noted  . Type 2 diabetes mellitus ([place]) 03/27/2020  . Bipolar depression ([place]) 12/28/2019  . Sleep apnea 12/28/2019  . Acute lower UTI 10/31/2019  . Hypokalemia  10/31/2019  . Tachycardia 10/31/2019  . Urinary tract infection without hematuria   . Hyperlipidemia 10/30/2015  . Obesity due to excess calories 10/30/2015    Past Surgical History:  Procedure Laterality Date  . Joint fusion on foot Right   . NASAL SEPTUM SURGERY       OB History   No obstetric history on file.     Family History  Problem Relation Age of Onset  . Hypertension Father   . Hyperlipidemia Father   . Diabetes Father   . Hypertension Mother   . Hyperlipidemia Mother   . Diabetes Mellitus II Mother   . Congestive Heart Failure Mother   . Hypertension Sister   . Diabetes Sister   . Hypertension Brother   . Diabetes Maternal Grandmother   . Lupus Paternal Grandmother     Social History   Tobacco Use  . Smoking status: Former Smoker    Types: Cigarettes    Quit date: 12/30/1982    Years since quitting: 37.3  . Smokeless tobacco: Never Used  Substance Use Topics  . Alcohol use: Not Currently    Alcohol/week: 0.0 standard drinks  . Drug use: No    Home Medications Prior to Admission medications   Medication Sig Start Date End Date Taking? Authorizing Provider  amitriptyline (ELAVIL) 25 MG tablet Take 25 mg by mouth at bedtime.  10/06/19  Yes [provider]  chlorthalidone (HYGROTON) 50 MG tablet Take 1 tablet (50 mg total) by mouth daily. 03/16/20  Yes Gosrani, Nimish C, MD  Cholecalciferol (VITAMIN D-3) 125 MCG (5000 UT) TABS Take 2 tablets by mouth daily.   Yes [provider]  Exenatide ER (BYDUREON BCISE) 2 MG/0.85ML AUIJ Inject 2 mg into the skin once a week. 05/01/20  Yes Gosrani, Nimish C, MD  glipiZIDE (GLUCOTROL) 10 MG tablet Take 1 tablet (10 mg total) by mouth 2 (two) times daily before a meal. 03/27/20 06/25/20 Yes Ailene Ards, NP  lamoTRIgine (LAMICTAL) 100 MG tablet Take 100 mg by mouth 2 (two) times daily.    Yes [provider]  metoprolol tartrate (LOPRESSOR) 50 MG tablet Take 50 mg by mouth 2 (two) times daily.     Yes [provider]  Potassium Chloride ER 20 MEQ TBCR Take 2 tablets by mouth daily. 03/28/20  Yes Ailene Ards, NP  REXULTI 2 MG TABS tablet Take 2 mg by mouth at bedtime.  10/05/19  Yes [provider]  cetirizine (ZYRTEC) 10 MG tablet Take 1 tablet (10 mg total) by mouth daily. 04/28/20   Wurst, [place], PA-C  empagliflozin (JARDIANCE) 25 MG TABS tablet Take 25 mg by mouth daily before breakfast. 04/27/20   Hurshel Party C, MD  fluticasone (FLONASE) 50 MCG/ACT nasal spray Place 2 sprays into both nostrils daily. Patient not taking: Reported on 05/02/2020 04/28/20   Wurst, [place], PA-C  promethazine (PHENERGAN) 25 MG tablet Take 1 tablet (25 mg total) by mouth every 12 (twelve) hours as needed for nausea or vomiting. 03/17/20 03/17/20  Wurst, [place], PA-C    Allergies    Keflex [cephalexin]  Review of Systems   Review of Systems  Constitutional: Negative for chills and fever.  HENT: Positive for trouble swallowing. Negative for drooling, ear pain and sore throat.   Respiratory: Negative for cough and shortness of breath.   Cardiovascular: Negative for chest pain.  Gastrointestinal: Negative for abdominal pain, nausea and vomiting.  All other systems reviewed and are negative.   Physical Exam Updated Vital Signs BP 129/77 (BP Location: Right Arm)   Pulse 87   Temp 98.4 F (36.9 C) (Oral)   Resp 20   Ht 5\' 10"  (1.778 m)   Wt 113.4 kg   LMP  (LMP Unknown)   BMI 35.87 kg/m   Physical Exam Vitals and nursing note reviewed.  Constitutional:      Appearance: She is obese. She is not ill-appearing or diaphoretic.  HENT:     Head: Normocephalic and atraumatic.     Right Ear: Tympanic membrane normal.     Left Ear: Tympanic membrane normal.     Mouth/Throat:     Mouth: Mucous membranes are moist.     Pharynx: No oropharyngeal exudate or posterior oropharyngeal erythema.     Comments: Posterior oropharynx clear and moist. Uvula is midline.  Eyes:      Conjunctiva/sclera: Conjunctivae normal.  Cardiovascular:     Rate and Rhythm: Normal rate and regular rhythm.     Pulses: Normal pulses.  Pulmonary:     Effort: Pulmonary effort is normal.     Breath sounds: Normal breath sounds. No wheezing, rhonchi or rales.  Abdominal:     Palpations: Abdomen is soft.     Tenderness: There is no abdominal tenderness. There is no guarding or rebound.  Musculoskeletal:     Cervical back: Neck supple.  Skin:  General: Skin is warm and dry.  Neurological:     Mental Status: She is alert.     ED Results / Procedures / Treatments   Labs (all labs ordered are listed, but only abnormal results are displayed) Labs Reviewed  BASIC METABOLIC PANEL - Abnormal; Notable for the following components:      Result Value   Potassium 2.8 (*)    Chloride 95 (*)    Glucose, Bld 127 (*)    Creatinine, Ser 1.08 (*)    GFR calc non Af Amer 58 (*)    Anion gap 16 (*)    All other components within normal limits  CBC WITH DIFFERENTIAL/PLATELET - Abnormal; Notable for the following components:   WBC 13.1 (*)    Hemoglobin 15.2 (*)    Neutro Abs 8.8 (*)    Abs Immature Granulocytes 0.13 (*)    All other components within normal limits    EKG None  Radiology CT Soft Tissue Neck W Contrast  Result Date: 05/02/2020 CLINICAL DATA:  Dysphagia. Additional history provided: Patient reports difficulty swallowing medications for 2 weeks and difficulty swallowing food for the past 2 days. Patient reports she took a pill earlier today and feels as though it stuck in her chest. EXAM: CT NECK WITH CONTRAST TECHNIQUE: Multidetector CT imaging of the neck was performed using the standard protocol following the bolus administration of intravenous contrast. CONTRAST:  51mL OMNIPAQUE IOHEXOL 300 MG/ML  SOLN COMPARISON:  CT angiogram chest 10/31/2011 FINDINGS: Pharynx and larynx: Streak and beam hardening artifact related to dental restoration limits evaluation of the oral  cavity. No appreciable swelling or discrete mass within the oral cavity, pharynx or larynx. The epiglottis is unremarkable. Salivary glands: No inflammation, mass, or stone. Thyroid: Subcentimeter left thyroid lobe nodule not meeting consensus criteria for ultrasound follow-up. Lymph nodes: No pathologically enlarged cervical chain lymph nodes are identified. Vascular: The major vascular structures of the neck are patent. Limited intracranial: No abnormality identified. Visualized orbits: Incompletely imaged. Visualized orbits show no acute finding. Mastoids and visualized paranasal sinuses: No significant paranasal sinus disease or mastoid effusion at the imaged levels. Skeleton: No acute bony abnormality or aggressive osseous lesion. Cervical spondylosis without high-grade bony spinal canal narrowing. Upper chest: No consolidation within the imaged lung apices. IMPRESSION: There is no appreciable soft tissue neck mass. No specific cause for dysphagia is identified. Electronically Signed   By: Kellie Simmering DO   On: 05/02/2020 18:24    Procedures Procedures (including critical care time)  Medications Ordered in ED Medications  sodium chloride 0.9 % bolus 1,000 mL (0 mLs Intravenous Stopped 05/02/20 1855)  potassium chloride 10 mEq in 100 mL IVPB (0 mEq Intravenous Stopped 05/02/20 1855)  iohexol (OMNIPAQUE) 300 MG/ML solution 75 mL (75 mLs Intravenous Contrast Given 05/02/20 1739)    ED Course  I have reviewed the triage vital signs and the nursing notes.  Pertinent labs & imaging results that were available during my care of the patient were reviewed by me and considered in my medical decision making (see chart for details).  Clinical Course as of May 02 1858  Tue May 02, 2020  1710 WBC(!): 13.1 [MV]    Clinical Course User Index [MV] Eustaquio Maize, [place]   MDM Rules/Calculators/A&P                      54 year old female presenting to the ED with complaint of difficulty swallowing pills x 2  weeks  and foods x 2 days. No current sensation of FB in throat. On arrival to the ED pt is afebrile, nontachycardic, and nontachypneic. She appears to be in NAD. She is laying comfortably in bed. No tripoding. No drooling. Her posterior oropharynx is clear and moist. Uvula is midline. She is tolerating her own secretions without difficulty. No obvious mass appreciated to neck. Pt denies any sore throat sensation or difficulty swallowing liquids. No hot potato voice. Low suspicion for deep space infection of the neck today; if any abnormality in labs may consider CT soft tissue neck.  Pt does have a hx of GERD but is not on any medications which may be attributing to her symptoms today. Anticipate discharge home with GI referral.   CBC with leukocytosis 13.1. She was recently placed on prednisone after developing rash s/2 keflex which I suspect is the cause of the elevation however given complaints of difficulty swallowing with leukocytosis will proceed with CT BMP with potassium 2.8, glucose 127, chloride 95, gap of 16. Suspect gap related to dehydration. Bicarb within normal limits at 24. No concern for DKA. Will give fluids with potassium supplementation.   CT scan without acute findings today.   Pt able to drink water at bedside without difficulty. Again she is without any type of FB sensation in her throat. Do not feel she needs emergent GI consult here. Will discharge home with GI referral for possible EGD given symptoms. Pt is in agreement with plan and stable for discharge home.   This note was prepared using Dragon voice recognition software and may include unintentional dictation errors due to the inherent limitations of voice recognition software.  Final Clinical Impression(s) / ED Diagnoses Final diagnoses:  Dysphagia, unspecified type  Hypokalemia    Rx / DC Orders ED Discharge Orders    None       Discharge Instructions     Your labwork showed a lot potassium today at 2.8.  Please continue taking your potassium supplements at home. You will need to have your potassium level rechecked by your PCP in 1-2 weeks  Please call Dr. Oneida Alar office to schedule an appointment to establish care given your symptoms of pills getting stuck in your throat.   Return to the ED immediately for any worsening symptoms including specifically sensation of food in your throat, shortness of breath, inability to tolerate your own saliva, fevers > 100.4       Eustaquio Maize, PA-C 05/02/20 1859    Truddie Hidden, MD 05/02/20 [phone]

## 2020-05-02 NOTE — Telephone Encounter (Signed)
Done

## 2020-05-03 ENCOUNTER — Other Ambulatory Visit (INDEPENDENT_AMBULATORY_CARE_PROVIDER_SITE_OTHER): Payer: Self-pay | Admitting: Nurse Practitioner

## 2020-05-03 ENCOUNTER — Telehealth (INDEPENDENT_AMBULATORY_CARE_PROVIDER_SITE_OTHER): Payer: Self-pay | Admitting: Nurse Practitioner

## 2020-05-03 ENCOUNTER — Encounter (INDEPENDENT_AMBULATORY_CARE_PROVIDER_SITE_OTHER): Payer: Self-pay | Admitting: Nurse Practitioner

## 2020-05-03 ENCOUNTER — Ambulatory Visit (INDEPENDENT_AMBULATORY_CARE_PROVIDER_SITE_OTHER): Payer: No Typology Code available for payment source | Admitting: Nurse Practitioner

## 2020-05-03 VITALS — BP 140/90 | HR 95 | Temp 98.0°F | Ht 70.0 in | Wt 254.6 lb

## 2020-05-03 DIAGNOSIS — E876 Hypokalemia: Secondary | ICD-10-CM

## 2020-05-03 DIAGNOSIS — R2243 Localized swelling, mass and lump, lower limb, bilateral: Secondary | ICD-10-CM | POA: Diagnosis not present

## 2020-05-03 DIAGNOSIS — M7989 Other specified soft tissue disorders: Secondary | ICD-10-CM

## 2020-05-03 DIAGNOSIS — E1100 Type 2 diabetes mellitus with hyperosmolarity without nonketotic hyperglycemic-hyperosmolar coma (NKHHC): Secondary | ICD-10-CM | POA: Diagnosis not present

## 2020-05-03 DIAGNOSIS — R1310 Dysphagia, unspecified: Secondary | ICD-10-CM

## 2020-05-03 DIAGNOSIS — R300 Dysuria: Secondary | ICD-10-CM

## 2020-05-03 MED ORDER — POTASSIUM CHLORIDE ER 20 MEQ PO TBCR: EXTENDED_RELEASE_TABLET | ORAL | 2 refills | Status: DC

## 2020-05-03 MED ORDER — CIPROFLOXACIN HCL 250 MG PO TABS: 250.0000 mg | ORAL_TABLET | Freq: Two times a day (BID) | ORAL | 0 refills | Status: DC

## 2020-05-03 MED ORDER — CHLORTHALIDONE 50 MG PO TABS: 25.0000 mg | ORAL_TABLET | Freq: Every day | ORAL | 0 refills | Status: DC

## 2020-05-03 MED ORDER — FLUCONAZOLE 150 MG PO TABS: 150.0000 mg | ORAL_TABLET | Freq: Once | ORAL | 0 refills | Status: AC

## 2020-05-03 NOTE — Telephone Encounter (Signed)
This encounter was created in error - please disregard.

## 2020-05-03 NOTE — Telephone Encounter (Signed)
Pre Josem Kaufmann has been started

## 2020-05-03 NOTE — Progress Notes (Signed)
Subjective:  Patient ID: Sydney Taylor, female    DOB: January 20, 1966  Age: 54 y.o. MRN: [id number]  CC:  Chief Complaint  Patient presents with  . Dysphagia    food & medications  . pain after urination  . Other    Hand swelling, hypokalemia      HPI  This patient arrives for an acute visit for the above.  Dysphagia: She tells me that over the past few weeks she has noticed she is been having some difficulty with swallowing her medications, over the last couple of days this has progressed to difficulty with swallowing food.  She has no issues with swallowing liquids.  Per chart review I see that she was seen in the emergency department yesterday for this concern.  At that time they recommend she follow-up with gastroenterology.  She is also been experiencing some odynophagia with swallowing.  She denies any heartburn symptoms, abdominal pain, melena, blood in stool, unexplained weight loss or fevers.  Dysuria: She also mentions to me that she has some pain with urination.  She denies any frequency.  She tells me she thinks she feels like she is getting a urinary tract infection.  Unfortunately she urinated prior to being evaluated in office today and is unable to give me a sample right now.  Bilateral hand swelling: She also mentions to me that she is been noticing some bilateral hand swelling for the last month or so.  She tells me is intermittent.  She denies any pain, numbness, tingling, weakness, does not note any pattern or aggravating/alleviating factors.  Hypokalemia: I do see that one blood work was taken yesterday her electrolytes were abnormal including low potassium, and low sodium.  She does continue on 50 mg of chlorthalidone.  This was originally prescribed for lower extremity swelling and probably also for hypertension.  I believe this was originally prescribed by her prior primary care provider.  She is not on a potassium supplement, but is having a hard time swallowing  due to her dysphagia.  I do see an elevated anion gap and discussed this with Dr. Anastasio Champion as well today.   Past Medical History:  Diagnosis Date  . Anemia   . Anxiety   . Arrhythmia   . Bipolar depression ([place]) 12/28/2019  . Chronic headaches   . Depression   . Diabetes mellitus without complication ([place])   . Fibromyalgia   . GERD (gastroesophageal reflux disease)   . HLD (hyperlipidemia)   . Hypertension   . Insect bite   . Prediabetes   . Sleep apnea   . Urinary incontinence       Family History  Problem Relation Age of Onset  . Hypertension Father   . Hyperlipidemia Father   . Diabetes Father   . Hypertension Mother   . Hyperlipidemia Mother   . Diabetes Mellitus II Mother   . Congestive Heart Failure Mother   . Hypertension Sister   . Diabetes Sister   . Hypertension Brother   . Diabetes Maternal Grandmother   . Lupus Paternal Grandmother     Social History   Social History Narrative   Married for 3 years.Lives with husband.Bachelors in Education officer, museum.   Social History   Tobacco Use  . Smoking status: Former Smoker    Types: Cigarettes    Quit date: 12/30/1982    Years since quitting: 37.3  . Smokeless tobacco: Never Used  Substance Use Topics  . Alcohol use: Not Currently  Alcohol/week: 0.0 standard drinks     Current Meds  Medication Sig  . amitriptyline (ELAVIL) 25 MG tablet Take 25 mg by mouth at bedtime.   . chlorthalidone (HYGROTON) 50 MG tablet Take 0.5 tablets (25 mg total) by mouth daily.  . Cholecalciferol (VITAMIN D-3) 125 MCG (5000 UT) TABS Take 2 tablets by mouth daily.  . empagliflozin (JARDIANCE) 25 MG TABS tablet Take 25 mg by mouth daily before breakfast.  . Exenatide ER (BYDUREON BCISE) 2 MG/0.85ML AUIJ Inject 2 mg into the skin once a week.  . fluticasone (FLONASE) 50 MCG/ACT nasal spray Place 2 sprays into both nostrils daily.  Marland Kitchen glipiZIDE (GLUCOTROL) 10 MG tablet Take 1 tablet (10 mg total) by mouth 2 (two) times daily before a  meal.  . lamoTRIgine (LAMICTAL) 100 MG tablet Take 100 mg by mouth 2 (two) times daily.   . metoprolol tartrate (LOPRESSOR) 50 MG tablet Take 50 mg by mouth 2 (two) times daily.   . Potassium Chloride ER 20 MEQ TBCR Take 2 tablets by mouth daily.  Marland Kitchen REXULTI 2 MG TABS tablet Take 2 mg by mouth at bedtime.   . [DISCONTINUED] chlorthalidone (HYGROTON) 50 MG tablet Take 1 tablet (50 mg total) by mouth daily.    ROS:  Review of Systems  Constitutional: Negative for fever and weight loss.  Respiratory: Negative for shortness of breath.   Cardiovascular: Negative for chest pain and palpitations.  Gastrointestinal: Negative for abdominal pain, blood in stool, heartburn, nausea and vomiting.       (+) oodynophagia  Genitourinary: Positive for dysuria. Negative for frequency and hematuria.     Objective:   Today's Vitals: BP 140/90 (BP Location: Left Arm, Patient Position: Sitting, Cuff Size: Normal)   Pulse 95   Temp 98 F (36.7 C) (Temporal)   Ht 5\' 10"  (1.778 m)   Wt 254 lb 9.6 oz (115.5 kg)   LMP  (LMP Unknown)   SpO2 98%   BMI 36.53 kg/m  Vitals with BMI 05/03/2020 05/02/2020 05/02/2020  Height 5\' 10"  - -  Weight 254 lbs 10 oz - -  BMI 123456 - -  Systolic [id number] - [id number]  Diastolic 90 - 52  Pulse 95 87 88     Physical Exam Vitals reviewed.  Constitutional:      General: She is not in acute distress.    Appearance: Normal appearance.  HENT:     Head: Normocephalic and atraumatic.  Neck:     Vascular: No carotid bruit.  Cardiovascular:     Rate and Rhythm: Normal rate and regular rhythm.     Pulses: Normal pulses.     Heart sounds: Normal heart sounds.     Comments: I did not notice any significant pitting edema to her upper extremities today on exam. Pulmonary:     Effort: Pulmonary effort is normal.     Breath sounds: Normal breath sounds.  Musculoskeletal:     Right lower leg: No edema.     Left lower leg: No edema.  Skin:    General: Skin is warm and dry.  Neurological:      General: No focal deficit present.     Mental Status: She is alert and oriented to person, place, and time.  Psychiatric:        Mood and Affect: Mood normal.        Behavior: Behavior normal.        Judgment: Judgment normal.  Assessment and Plan   1. Dysphagia, unspecified type   2. Hypokalemia   3. Type 2 diabetes mellitus with hyperosmolarity without coma, without long-term current use of insulin ([place])   4. Localized swelling, mass, or lump of lower extremity, bilateral   5. Dysuria   6. Left upper extremity swelling      Plan: 1.  I do think she needs to be evaluated by gastroenterology and may need to undergo EGD or other evaluation.  I will send referral for this today.  She is requesting that I call her pharmacy to see if she can have a substitute to the potassium supplement that is easier to swallow.  I will do this later today.  2.,  6.  I did increase her dose of potassium to 60 mEq daily, I did discuss this with Dr. Anastasio Champion he is agreeable to this as well.  I will also reduce her chlorthalidone from 50 mg down to 25 mg daily.  She will follow-up in approximate 1 week at which time we will recheck her metabolic panel and blood pressure.  We will also monitor for worsening swelling in her lower extremities with reduction in chlorthalidone dose.  3.  She tells me she needs prior authorizations for both her by durian and Jardiance.  I will look into see if this is being completed.  4.  She denied any joint pain, or other worrisome symptoms.  My differential diagnosis includes arthritis, gout, renal dysfunction, cardiac dysfunction, blood clot.  Per her history, symptoms, and signs on exam not able to pinpoint exact etiology at this time.  Because her swelling is not consistent and does not seem to be causing severe discomfort, I will discuss this with her further at her next office visit we will also start by collecting blood work which we will do next week.  I  did tell her if the swelling becomes more severe or bothersome between now and then to let me know which time we can do work-up sooner.  5.  Unfortunately I was not able to get a urine sample today in the office, however I have given her a urine cup to bring home so that she can return a urine sample later today.  I will treat her prophylactically with antibiotics, she tells me she has tolerated ciprofloxacin well in the past.  I will prescribe this for her today and have warned her about the risk for tendon rupture and that if she were to have any joint pain on this medication to let me know.  I have also warned her about the risk of diarrhea, and she tells me that when she takes an antibiotic she usually will take either probiotic or probiotic yogurt and this seems to help with this side effect.  I encouraged her to do this again.  She also sometimes gets yeast infections with antibiotics, thus I will send prescription for Diflucan that she can take only if needed.  Tests ordered Orders Placed This Encounter  Procedures  . Urinalysis with Culture Reflex  . Ambulatory referral to Gastroenterology      Meds ordered this encounter  Medications  . chlorthalidone (HYGROTON) 50 MG tablet    Sig: Take 0.5 tablets (25 mg total) by mouth daily.    Dispense:  90 tablet    Refill:  0    Order Specific Question:   Supervising Provider    Answer:   Hurshel Party C [id number]  . ciprofloxacin (CIPRO) 250 MG tablet  Sig: Take 1 tablet (250 mg total) by mouth 2 (two) times daily.    Dispense:  6 tablet    Refill:  0    Order Specific Question:   Supervising Provider    Answer:   Anastasio Champion, NIMISH C [id number]  . fluconazole (DIFLUCAN) 150 MG tablet    Sig: Take 1 tablet (150 mg total) by mouth once for 1 dose.    Dispense:  1 tablet    Refill:  0    Order Specific Question:   Supervising Provider    Answer:   Doree Albee [id number]    Patient to follow-up next week. I spent >40 minutes dedicated to  the care of this patient on the date of this encounter which includes a combination of either face-to-face or virtual contact with the patient, review of records , and ordering of tests and/or procedures.  Ailene Ards, NP

## 2020-05-03 NOTE — Telephone Encounter (Signed)
Please initiate a prior authorization for the patient's Bydureon Bcise 2mg  SQ injection weekly, and for Jardiance 25mg  tablet by mouth daily. She has already tried and failed metformin due to intolerable side effects. Let me know if you have any other questions. Once this has been processed let me know so I can send refills of her medications to her pharmacy. The Bydueron will need to be sent to [facility] in [place] and the Jardiance will need to be sent [facility] [employer] in [place]. Thank you.

## 2020-05-03 NOTE — Telephone Encounter (Signed)
Great! Thank you, let me know once you get an answer regarding whether or not the medications are approved. Once we get this message hopefully I can send the prescription to her pharmacy. Thanks again.

## 2020-05-04 ENCOUNTER — Encounter (INDEPENDENT_AMBULATORY_CARE_PROVIDER_SITE_OTHER): Payer: Self-pay | Admitting: Nurse Practitioner

## 2020-05-05 ENCOUNTER — Encounter: Payer: Self-pay | Admitting: Internal Medicine

## 2020-05-05 LAB — URINALYSIS W MICROSCOPIC + REFLEX CULTURE
Bacteria, UA: NONE SEEN /HPF
Bilirubin Urine: NEGATIVE
Glucose, UA: NEGATIVE
Hgb urine dipstick: NEGATIVE
Hyaline Cast: NONE SEEN /LPF
Ketones, ur: NEGATIVE
Leukocyte Esterase: NEGATIVE
Nitrites, Initial: NEGATIVE
Protein, ur: NEGATIVE
RBC / HPF: NONE SEEN /HPF (ref 0–2)
Specific Gravity, Urine: 1.012 (ref 1.001–1.03)
WBC, UA: NONE SEEN /HPF (ref 0–5)
pH: 6.5 (ref 5.0–8.0)

## 2020-05-05 LAB — NO CULTURE INDICATED

## 2020-05-07 ENCOUNTER — Other Ambulatory Visit: Payer: Self-pay

## 2020-05-07 ENCOUNTER — Encounter (HOSPITAL_COMMUNITY): Payer: Self-pay

## 2020-05-07 ENCOUNTER — Emergency Department (HOSPITAL_COMMUNITY)
Admission: EM | Admit: 2020-05-07 | Discharge: 2020-05-07 | Disposition: A | Discharge status: Home / Self Care | Payer: No Typology Code available for payment source | Attending: Emergency Medicine | Admitting: Emergency Medicine

## 2020-05-07 DIAGNOSIS — I10 Essential (primary) hypertension: Secondary | ICD-10-CM | POA: Diagnosis not present

## 2020-05-07 DIAGNOSIS — R1310 Dysphagia, unspecified: Secondary | ICD-10-CM

## 2020-05-07 DIAGNOSIS — E119 Type 2 diabetes mellitus without complications: Secondary | ICD-10-CM | POA: Insufficient documentation

## 2020-05-07 DIAGNOSIS — Z79899 Other long term (current) drug therapy: Secondary | ICD-10-CM | POA: Insufficient documentation

## 2020-05-07 DIAGNOSIS — Z87891 Personal history of nicotine dependence: Secondary | ICD-10-CM | POA: Diagnosis not present

## 2020-05-07 DIAGNOSIS — Z7984 Long term (current) use of oral hypoglycemic drugs: Secondary | ICD-10-CM | POA: Diagnosis not present

## 2020-05-07 LAB — CBC WITH DIFFERENTIAL/PLATELET
Abs Immature Granulocytes: 0.06 10*3/uL (ref 0.00–0.07)
Basophils Absolute: 0.1 10*3/uL (ref 0.0–0.1)
Basophils Relative: 1 %
Eosinophils Absolute: 0.3 10*3/uL (ref 0.0–0.5)
Eosinophils Relative: 3 %
HCT: 42.7 % (ref 36.0–46.0)
Hemoglobin: 14.7 g/dL (ref 12.0–15.0)
Immature Granulocytes: 1 %
Lymphocytes Relative: 14 %
Lymphs Abs: 1.5 10*3/uL (ref 0.7–4.0)
MCH: 31.4 pg (ref 26.0–34.0)
MCHC: 34.4 g/dL (ref 30.0–36.0)
MCV: 91.2 fL (ref 80.0–100.0)
Monocytes Absolute: 0.9 10*3/uL (ref 0.1–1.0)
Monocytes Relative: 8 %
Neutro Abs: 7.6 10*3/uL (ref 1.7–7.7)
Neutrophils Relative %: 73 %
Platelets: 261 10*3/uL (ref 150–400)
RBC: 4.68 MIL/uL (ref 3.87–5.11)
RDW: 13.2 % (ref 11.5–15.5)
WBC: 10.4 10*3/uL (ref 4.0–10.5)
nRBC: 0 % (ref 0.0–0.2)

## 2020-05-07 LAB — COMPREHENSIVE METABOLIC PANEL
ALT: 24 U/L (ref 0–44)
AST: 21 U/L (ref 15–41)
Albumin: 3.6 g/dL (ref 3.5–5.0)
Alkaline Phosphatase: 66 U/L (ref 38–126)
Anion gap: 13 (ref 5–15)
BUN: 9 mg/dL (ref 6–20)
CO2: 27 mmol/L (ref 22–32)
Calcium: 10.3 mg/dL (ref 8.9–10.3)
Chloride: 96 mmol/L — ABNORMAL LOW (ref 98–111)
Creatinine, Ser: 1 mg/dL (ref 0.44–1.00)
GFR calc Af Amer: >60 mL/min (ref >60)
GFR calc non Af Amer: >60 mL/min (ref >60)
Glucose, Bld: 179 mg/dL — ABNORMAL HIGH (ref 70–99)
Potassium: 2.9 mmol/L — ABNORMAL LOW (ref 3.5–5.1)
Sodium: 136 mmol/L (ref 135–145)
Total Bilirubin: 1 mg/dL (ref 0.3–1.2)
Total Protein: 6.5 g/dL (ref 6.5–8.1)

## 2020-05-07 MED ORDER — SODIUM CHLORIDE 0.9 % IV BOLUS
1000.0000 mL | Freq: Once | INTRAVENOUS | Status: AC
  Administered 2020-05-07: 10:00:00 1000 mL via INTRAVENOUS

## 2020-05-07 MED ORDER — CIPROFLOXACIN 500 MG/5ML (10%) PO SUSR: 500.0000 mg | Freq: Once | ORAL | Status: DC

## 2020-05-07 MED ORDER — POTASSIUM CHLORIDE 10 MEQ/100ML IV SOLN
10.0000 meq | Freq: Once | INTRAVENOUS | Status: AC
  Administered 2020-05-07: 10 meq via INTRAVENOUS
  Filled 2020-05-07: qty 100

## 2020-05-07 MED ORDER — CIPROFLOXACIN IN D5W 400 MG/200ML IV SOLN
400.0000 mg | Freq: Once | INTRAVENOUS | Status: AC
  Administered 2020-05-07: 400 mg via INTRAVENOUS
  Filled 2020-05-07: qty 200

## 2020-05-07 MED ORDER — POTASSIUM CHLORIDE 20 MEQ PO PACK
20.0000 meq | PACK | Freq: Once | ORAL | Status: AC
  Administered 2020-05-07: 20 meq via ORAL
  Filled 2020-05-07: qty 1

## 2020-05-07 MED ORDER — POTASSIUM CHLORIDE 25 MEQ PO PACK: PACK | ORAL | 0 refills | Status: DC

## 2020-05-07 NOTE — Discharge Instructions (Addendum)
Follow up with dr. Oneida Alar or one of her colleagues.  Call the office tomorrow and tell them that you were in the emergency department for difficulty swallowing and the emergency physician felt like you need to be seen this week.  Take only liquids.  And crush your medicines.

## 2020-05-07 NOTE — ED Provider Notes (Addendum)
[facility] EMERGENCY DEPARTMENT Provider Note   CSN: [id number] Arrival date & time: 05/07/20  0813     History Chief Complaint  Patient presents with  . Dysphagia    Sydney Taylor is a 54 y.o. female.   patient states that she has been having difficulty swallowing food and pills.  She cannot swallow liquids.  This has been getting worse over the last week  The history is provided by the patient and medical records. No language interpreter was used.  Swallowed Foreign Body This is a new problem. The current episode started more than 2 days ago. The problem occurs constantly. The problem has not changed since onset.Pertinent negatives include no chest pain, no abdominal pain and no headaches. Nothing aggravates the symptoms. She has tried nothing for the symptoms. The treatment provided no relief.       Past Medical History:  Diagnosis Date  . Anemia   . Anxiety   . Arrhythmia   . Bipolar depression ([place]) 12/28/2019  . Chronic headaches   . Depression   . Diabetes mellitus without complication ([place])   . Fibromyalgia   . GERD (gastroesophageal reflux disease)   . HLD (hyperlipidemia)   . Hypertension   . Insect bite   . Prediabetes   . Sleep apnea   . Urinary incontinence     Patient Active Problem List   Diagnosis Date Noted  . Type 2 diabetes mellitus ([place]) 03/27/2020  . Bipolar depression ([place]) 12/28/2019  . Sleep apnea 12/28/2019  . Acute lower UTI 10/31/2019  . Hypokalemia 10/31/2019  . Tachycardia 10/31/2019  . Urinary tract infection without hematuria   . Hyperlipidemia 10/30/2015  . Obesity due to excess calories 10/30/2015    Past Surgical History:  Procedure Laterality Date  . Joint fusion on foot Right   . NASAL SEPTUM SURGERY       OB History   No obstetric history on file.     Family History  Problem Relation Age of Onset  . Hypertension Father   . Hyperlipidemia Father   . Diabetes Father   . Hypertension Mother   .  Hyperlipidemia Mother   . Diabetes Mellitus II Mother   . Congestive Heart Failure Mother   . Hypertension Sister   . Diabetes Sister   . Hypertension Brother   . Diabetes Maternal Grandmother   . Lupus Paternal Grandmother     Social History   Tobacco Use  . Smoking status: Former Smoker    Types: Cigarettes    Quit date: 12/30/1982    Years since quitting: 37.3  . Smokeless tobacco: Never Used  Substance Use Topics  . Alcohol use: Not Currently    Alcohol/week: 0.0 standard drinks  . Drug use: No    Home Medications Prior to Admission medications   Medication Sig Start Date End Date Taking? Authorizing Provider  amitriptyline (ELAVIL) 25 MG tablet Take 25 mg by mouth at bedtime.  10/06/19  Yes [provider]  chlorthalidone (HYGROTON) 50 MG tablet Take 0.5 tablets (25 mg total) by mouth daily. 05/03/20  Yes Ailene Ards, NP  Cholecalciferol (VITAMIN D-3) 125 MCG (5000 UT) TABS Take 2 tablets by mouth daily.   Yes [provider]  ciprofloxacin (CIPRO) 250 MG tablet Take 1 tablet (250 mg total) by mouth 2 (two) times daily. 05/03/20  Yes Ailene Ards, NP  empagliflozin (JARDIANCE) 25 MG TABS tablet Take 25 mg by mouth daily before breakfast. 04/27/20  Yes Gosrani, Nimish C,  MD  Exenatide ER (BYDUREON BCISE) 2 MG/0.85ML AUIJ Inject 2 mg into the skin once a week. 05/01/20  Yes Gosrani, Nimish C, MD  glipiZIDE (GLUCOTROL) 10 MG tablet Take 1 tablet (10 mg total) by mouth 2 (two) times daily before a meal. 03/27/20 06/25/20 Yes Ailene Ards, NP  lamoTRIgine (LAMICTAL) 100 MG tablet Take 100 mg by mouth 2 (two) times daily.    Yes [provider]  losartan (COZAAR) 25 MG tablet Take 25 mg by mouth daily.   Yes [provider]  metoprolol tartrate (LOPRESSOR) 50 MG tablet Take 50 mg by mouth 2 (two) times daily.    Yes [provider]  REXULTI 2 MG TABS tablet Take 2 mg by mouth at bedtime.  10/05/19  Yes [provider]  cetirizine  (ZYRTEC) 10 MG tablet Take 1 tablet (10 mg total) by mouth daily. Patient not taking: Reported on 05/03/2020 04/28/20   Wurst, [place], PA-C  fluticasone [facility]) 50 MCG/ACT nasal spray Place 2 sprays into both nostrils daily. Patient not taking: Reported on 05/07/2020 04/28/20   Wurst, [place], PA-C  Potassium Chloride 25 MEQ PACK One pot q day 05/07/20   Milton Ferguson, MD  promethazine (PHENERGAN) 25 MG tablet Take 1 tablet (25 mg total) by mouth every 12 (twelve) hours as needed for nausea or vomiting. 03/17/20 03/17/20  Wurst, [place], PA-C    Allergies    Keflex [cephalexin]  Review of Systems   Review of Systems  Constitutional: Negative for appetite change and fatigue.  HENT: Negative for congestion, ear discharge and sinus pressure.   Eyes: Negative for discharge.  Respiratory: Negative for cough.   Cardiovascular: Negative for chest pain.  Gastrointestinal: Negative for abdominal pain and diarrhea.  Genitourinary: Negative for frequency and hematuria.  Musculoskeletal: Negative for back pain.  Skin: Negative for rash.  Neurological: Negative for seizures and headaches.  Psychiatric/Behavioral: Negative for hallucinations.    Physical Exam Updated Vital Signs BP 126/83 (BP Location: Left Arm)   Pulse (!) 115   Temp 98.7 F (37.1 C) (Oral)   Resp 18   Ht 5\' 10"  (1.778 m)   Wt 115 kg   LMP  (LMP Unknown)   SpO2 98%   BMI 36.38 kg/m   Physical Exam Vitals and nursing note reviewed.  Constitutional:      Appearance: She is well-developed.  HENT:     Head: Normocephalic.     Nose: Nose normal.  Eyes:     General: No scleral icterus.    Conjunctiva/sclera: Conjunctivae normal.  Neck:     Thyroid: No thyromegaly.  Cardiovascular:     Rate and Rhythm: Normal rate and regular rhythm.     Heart sounds: No murmur. No friction rub. No gallop.   Pulmonary:     Breath sounds: No stridor. No wheezing or rales.  Chest:     Chest wall: No tenderness.  Abdominal:      General: There is no distension.     Tenderness: There is no abdominal tenderness. There is no rebound.  Musculoskeletal:        General: Normal range of motion.     Cervical back: Neck supple.  Lymphadenopathy:     Cervical: No cervical adenopathy.  Skin:    Findings: No erythema or rash.  Neurological:     Mental Status: She is alert and oriented to person, place, and time.     Motor: No abnormal muscle tone.     Coordination: Coordination normal.  Psychiatric:        Behavior: Behavior normal.     ED Results / Procedures / Treatments   Labs (all labs ordered are listed, but only abnormal results are displayed) Labs Reviewed  COMPREHENSIVE METABOLIC PANEL - Abnormal; Notable for the following components:      Result Value   Potassium 2.9 (*)    Chloride 96 (*)    Glucose, Bld 179 (*)    All other components within normal limits  CBC WITH DIFFERENTIAL/PLATELET    EKG None  Radiology No results found.  Procedures Procedures (including critical care time)  Medications Ordered in ED Medications  potassium chloride 10 mEq in 100 mL IVPB (10 mEq Intravenous New Bag/Given 05/07/20 1149)  sodium chloride 0.9 % bolus 1,000 mL (0 mLs Intravenous Stopped 05/07/20 1150)  ciprofloxacin (CIPRO) IVPB 400 mg (0 mg Intravenous Stopped 05/07/20 1149)  potassium chloride (KLOR-CON) packet 20 mEq (20 mEq Oral Given 05/07/20 1118)    ED Course  I have reviewed the triage vital signs and the nursing notes.  Pertinent labs & imaging results that were available during my care of the patient were reviewed by me and considered in my medical decision making (see chart for details).    MDM Rules/Calculators/A&P                       patient with difficulty swallowing.  She can swallow liquids.  She can crush all her pills and we have written for potassium liquid.  Patient will follow up with GI    This patient presents to the ED for concern of difficulty swallowing, this involves an  extensive number of treatment options, and is a complaint that carries with it a high risk of complications and morbidity.  The differential diagnosis includes foreign body or tumor in esophagus   Lab Tests:   I Ordered, reviewed, and interpreted labs, which included CBC chemistries which showed hypokalemia  Medicines ordered:   I ordered medication potassium for hypokalemia  Imaging Studies ordered:   Additional history obtained:   Additional history obtained from records  Previous records obtained and reviewed   Consultations Obtained:   Reevaluation:  After the interventions stated above, I reevaluated the patient and found unchanged  Critical Interventions:  .   Final Clinical Impression(s) / ED Diagnoses Final diagnoses:  Difficulty swallowing solids    Rx / DC Orders ED Discharge Orders         Ordered    Potassium Chloride 25 MEQ PACK     05/07/20 1237           Milton Ferguson, MD 05/08/20 [id number]    Milton Ferguson, MD 05/17/20 1828

## 2020-05-07 NOTE — ED Triage Notes (Signed)
PT reports has been having difficulty swallowing for the past few weeks.  PT says initially she was having difficulty with swallowing pills but now she's having problems with solid foods.  Reports hasn't been able to take her pills recently.  Pt says thinks she has a pill stuck in her esophagus now.  Reports is on antibiotics for a uti but hasn't been able to finish them and is having some urinary discomfort.

## 2020-05-08 ENCOUNTER — Encounter (INDEPENDENT_AMBULATORY_CARE_PROVIDER_SITE_OTHER): Payer: Self-pay | Admitting: Nurse Practitioner

## 2020-05-08 ENCOUNTER — Ambulatory Visit (INDEPENDENT_AMBULATORY_CARE_PROVIDER_SITE_OTHER): Payer: No Typology Code available for payment source | Admitting: Nurse Practitioner

## 2020-05-10 ENCOUNTER — Other Ambulatory Visit: Payer: Self-pay

## 2020-05-10 ENCOUNTER — Encounter (INDEPENDENT_AMBULATORY_CARE_PROVIDER_SITE_OTHER): Payer: Self-pay | Admitting: Nurse Practitioner

## 2020-05-10 ENCOUNTER — Ambulatory Visit (INDEPENDENT_AMBULATORY_CARE_PROVIDER_SITE_OTHER): Payer: No Typology Code available for payment source | Admitting: Nurse Practitioner

## 2020-05-10 ENCOUNTER — Emergency Department (HOSPITAL_COMMUNITY)
Admission: EM | Admit: 2020-05-10 | Discharge: 2020-05-11 | Disposition: A | Discharge status: Home / Self Care | Payer: No Typology Code available for payment source | Attending: Emergency Medicine | Admitting: Emergency Medicine

## 2020-05-10 VITALS — BP 130/85 | HR 96 | Temp 98.1°F | Ht 70.0 in | Wt 252.2 lb

## 2020-05-10 DIAGNOSIS — R1314 Dysphagia, pharyngoesophageal phase: Secondary | ICD-10-CM | POA: Diagnosis not present

## 2020-05-10 DIAGNOSIS — R1310 Dysphagia, unspecified: Secondary | ICD-10-CM

## 2020-05-10 DIAGNOSIS — Z20822 Contact with and (suspected) exposure to covid-19: Secondary | ICD-10-CM | POA: Insufficient documentation

## 2020-05-10 DIAGNOSIS — Z87891 Personal history of nicotine dependence: Secondary | ICD-10-CM | POA: Insufficient documentation

## 2020-05-10 DIAGNOSIS — R300 Dysuria: Secondary | ICD-10-CM | POA: Diagnosis not present

## 2020-05-10 DIAGNOSIS — E1100 Type 2 diabetes mellitus with hyperosmolarity without nonketotic hyperglycemic-hyperosmolar coma (NKHHC): Secondary | ICD-10-CM | POA: Diagnosis not present

## 2020-05-10 DIAGNOSIS — I10 Essential (primary) hypertension: Secondary | ICD-10-CM | POA: Diagnosis not present

## 2020-05-10 DIAGNOSIS — R1319 Other dysphagia: Secondary | ICD-10-CM

## 2020-05-10 DIAGNOSIS — E119 Type 2 diabetes mellitus without complications: Secondary | ICD-10-CM | POA: Diagnosis not present

## 2020-05-10 DIAGNOSIS — E876 Hypokalemia: Secondary | ICD-10-CM | POA: Diagnosis not present

## 2020-05-10 MED ORDER — POTASSIUM CHLORIDE 20 MEQ/15ML (10%) PO SOLN: 20.0000 meq | Freq: Three times a day (TID) | ORAL | 2 refills | Status: DC

## 2020-05-10 MED ORDER — LIDOCAINE VISCOUS HCL 2 % MT SOLN
15.0000 mL | OROMUCOSAL | 0 refills | Status: DC | PRN
Start: 2020-05-10 — End: 2020-07-17

## 2020-05-10 MED ORDER — JARDIANCE 25 MG PO TABS: 25.0000 mg | ORAL_TABLET | Freq: Every day | ORAL | 3 refills | Status: DC

## 2020-05-10 MED ORDER — BYDUREON BCISE 2 MG/0.85ML ~~LOC~~ AUIJ: 2.0000 mg | AUTO-INJECTOR | SUBCUTANEOUS | 3 refills | Status: DC

## 2020-05-10 MED ORDER — LIDOCAINE VISCOUS HCL 2 % MT SOLN
15.0000 mL | Freq: Once | OROMUCOSAL | Status: AC
  Administered 2020-05-10: 15 mL via OROMUCOSAL
  Filled 2020-05-10: qty 15

## 2020-05-10 NOTE — Telephone Encounter (Signed)
[place] :)

## 2020-05-10 NOTE — ED Notes (Signed)
Pt able to drink water however states "I feel like it doesn't want to go down."

## 2020-05-10 NOTE — Progress Notes (Signed)
Subjective:  Patient ID: Sydney Taylor, female    DOB: 1966-06-27  Age: 54 y.o. MRN: [id number]  CC:  Chief Complaint  Patient presents with  . Diabetes  . Dysphagia  . Hypertension  . Other    Hypokalemia      HPI  This patient arrives for follow-up for the above.  Diabetes: She has a history of type 2 diabetes.  We have been trying to get approval with her insurance to have her start by durian and Jardiance.  She has tried Metformin in the past has not tolerated it due to adverse side effects.  We have finally gotten prior authorization, and I will try to order these today.  The patient tells me that because of her dysphagia she has been eating a liquid diet and her blood sugars at home have actually been much improved even though she is been out of her medication for a week to 2 weeks.  She tells me her fasting blood sugar this morning was 108.  Dysphagia: She continues to have dysphagia and is getting progressively worse.  As stated above she is eating a liquid only diet.  This is very concerning to her.  She did go to the emergency department a few days ago at which point she underwent CT scan of the neck which did not show any mass to explain her symptoms.  I think she most likely will need esophageal stretching, and she tells me she has the procedure scheduled for Tuesday by her gastroenterologist.  Hypertension: At her last office visit I had reduced her chlorthalidone to 25 mg daily.  She is also continues on her metoprolol and on her losartan.  Hypokalemia: She has had persistent hypokalemia, thus I have reduced her chlorthalidone.  She is wondering if she could take a liquid version of the potassium because even the K. Dur which can be dissolved in water is difficult for her to swallow at this time related to her dysphagia.  She tells me she has a sensation that it is getting stuck in her throat.  She is due to have this checked again today.  Dysuria: She was recently  treated for UTI and she would like to know if this has cleared.   Past Medical History:  Diagnosis Date  . Anemia   . Anxiety   . Arrhythmia   . Bipolar depression ([place]) 12/28/2019  . Chronic headaches   . Depression   . Diabetes mellitus without complication ([place])   . Fibromyalgia   . GERD (gastroesophageal reflux disease)   . HLD (hyperlipidemia)   . Hypertension   . Insect bite   . Prediabetes   . Sleep apnea   . Urinary incontinence       Family History  Problem Relation Age of Onset  . Hypertension Father   . Hyperlipidemia Father   . Diabetes Father   . Hypertension Mother   . Hyperlipidemia Mother   . Diabetes Mellitus II Mother   . Congestive Heart Failure Mother   . Hypertension Sister   . Diabetes Sister   . Hypertension Brother   . Diabetes Maternal Grandmother   . Lupus Paternal Grandmother     Social History   Social History Narrative   Married for 3 years.Lives with husband.Bachelors in Education officer, [employer].   Social History   Tobacco Use  . Smoking status: Former Smoker    Types: Cigarettes    Quit date: 12/30/1982    Years since quitting: 37.3  .  Smokeless tobacco: Never Used  Substance Use Topics  . Alcohol use: Not Currently    Alcohol/week: 0.0 standard drinks     Current Meds  Medication Sig  . amitriptyline (ELAVIL) 25 MG tablet Take 25 mg by mouth at bedtime.   . cetirizine (ZYRTEC) 10 MG tablet Take 1 tablet (10 mg total) by mouth daily.  . chlorthalidone (HYGROTON) 50 MG tablet Take 0.5 tablets (25 mg total) by mouth daily.  . Cholecalciferol (VITAMIN D-3) 125 MCG (5000 UT) TABS Take 2 tablets by mouth daily.  . ciprofloxacin (CIPRO) 250 MG tablet Take 1 tablet (250 mg total) by mouth 2 (two) times daily.  . empagliflozin (JARDIANCE) 25 MG TABS tablet Take 25 mg by mouth daily before breakfast.  . Exenatide ER (BYDUREON BCISE) 2 MG/0.85ML AUIJ Inject 2 mg into the skin once a week.  . fluticasone (FLONASE) 50 MCG/ACT nasal spray Place 2  sprays into both nostrils daily.  Marland Kitchen glipiZIDE (GLUCOTROL) 10 MG tablet Take 1 tablet (10 mg total) by mouth 2 (two) times daily before a meal.  . lamoTRIgine (LAMICTAL) 100 MG tablet Take 100 mg by mouth 2 (two) times daily.   Marland Kitchen losartan (COZAAR) 25 MG tablet Take 25 mg by mouth daily.  . metoprolol tartrate (LOPRESSOR) 50 MG tablet Take 50 mg by mouth 2 (two) times daily.   Marland Kitchen omeprazole (PRILOSEC) 20 MG capsule Take 20 mg by mouth every morning.  . Potassium Chloride 25 MEQ PACK One pot q day  . REXULTI 2 MG TABS tablet Take 2 mg by mouth at bedtime.   . [DISCONTINUED] empagliflozin (JARDIANCE) 25 MG TABS tablet Take 25 mg by mouth daily before breakfast.  . [DISCONTINUED] Exenatide ER (BYDUREON BCISE) 2 MG/0.85ML AUIJ Inject 2 mg into the skin once a week.    ROS:  Review of Systems  Constitutional: Negative.   Eyes: Negative.   Respiratory: Negative.   Cardiovascular: Negative.   Gastrointestinal:       (+) Dysphagia  Genitourinary: Negative.      Objective:   Today's Vitals: BP 130/85 (BP Location: Left Arm, Patient Position: Sitting, Cuff Size: Normal)   Pulse 96   Temp 98.1 F (36.7 C) (Temporal)   Ht 5' 10"  (1.778 m)   Wt 252 lb 3.2 oz (114.4 kg)   LMP  (LMP Unknown)   SpO2 98%   BMI 36.19 kg/m  Vitals with BMI 05/10/2020 05/07/2020 05/07/2020  Height 5' 10"  - -  Weight 252 lbs 3 oz - -  BMI 81.27 - -  Systolic 517 001 749  Diastolic 85 61 83  Pulse 96 105 115     Physical Exam Vitals reviewed.  Constitutional:      General: She is not in acute distress.    Appearance: Normal appearance.  HENT:     Head: Normocephalic and atraumatic.  Neck:     Vascular: No carotid bruit.  Cardiovascular:     Rate and Rhythm: Normal rate and regular rhythm.     Pulses: Normal pulses.     Heart sounds: Normal heart sounds.  Pulmonary:     Effort: Pulmonary effort is normal.     Breath sounds: Normal breath sounds.  Skin:    General: Skin is warm and dry.  Neurological:       General: No focal deficit present.     Mental Status: She is alert and oriented to person, place, and time.  Psychiatric:        Mood and  Affect: Mood normal.        Behavior: Behavior normal.        Judgment: Judgment normal.          Assessment and Plan   1. Type 2 diabetes mellitus with hyperosmolarity without coma, without long-term current use of insulin ([place])   2. Dysuria   3. Hypokalemia   4. Hypertension, unspecified type   5. Dysphagia, unspecified type      Plan: 1.  I have sent orders of her by durian and Jardiance to her pharmacy.  Because her blood sugar has been much better controlled since her diet has been liquid only, patient and myself both agreed that she will hold off on starting these medications until after her procedure.  I did tell her that if she notices her fasting blood sugar starting to creep up to closer to 200 or 250 she may want to start the Jardiance.  She tells me she understands.  2.  I will collect urine sample and send off her urinalysis with reflex to culture to make sure that her UTI has fully cleared.  3.  I am going to check a metabolic panel for further evaluation of her hypokalemia.  I will also call her pharmacy and see if I can prescribe liquid form of potassium.  4.  Blood pressure is relatively well controlled on current regimen, she will not make any changes to medications today.  5.  I encouraged her to follow-up with her procedure on Tuesday, she tells me she will.   Tests ordered Orders Placed This Encounter  Procedures  . CMP with eGFR([facility])  . Urinalysis with Culture Reflex      Meds ordered this encounter  Medications  . Exenatide ER (BYDUREON BCISE) 2 MG/0.85ML AUIJ    Sig: Inject 2 mg into the skin once a week.    Dispense:  3.4 mL    Refill:  3    Order Specific Question:   Supervising Provider    Answer:   Anastasio Champion, NIMISH C [[id number]]  . empagliflozin (JARDIANCE) 25 MG TABS tablet    Sig: Take 25 mg by  mouth daily before breakfast.    Dispense:  30 tablet    Refill:  3    Order Specific Question:   Supervising Provider    Answer:   Doree Albee [[id number]]    Patient to follow-up in 1 month or sooner as needed.  Ailene Ards, NP

## 2020-05-10 NOTE — ED Provider Notes (Signed)
Emergency Department Provider Note  I have reviewed the triage vital signs and the nursing notes.  HISTORY  Chief Complaint foreign body throat   HPI Sydney Taylor is a 54 y.o. female who presents the emergency department today again secondary to dysphagia.  Patient has been here a couple times in the last few weeks for similar symptoms.  She is seen her doctor for as well and has a EGD scheduled for Tuesday.  She states that she is having progressively worsening ability to swallow.  She states she cannot swallow water however when pressed further she states she can drink water just feels like it can get stuck.  She is urinating normally.  She had decreased intake for solids.  She also feels like her pills sometimes get stuck so she has to crush them up.   No fevers.  No history of the same.  No other associated or modifying symptoms.    Past Medical History:  Diagnosis Date  . Anemia   . Anxiety   . Arrhythmia   . Bipolar depression ([place]) 12/28/2019  . Chronic headaches   . Depression   . Diabetes mellitus without complication ([place])   . Fibromyalgia   . GERD (gastroesophageal reflux disease)   . HLD (hyperlipidemia)   . Hypertension   . Insect bite   . Prediabetes   . Sleep apnea   . Urinary incontinence     Patient Active Problem List   Diagnosis Date Noted  . Type 2 diabetes mellitus ([place]) 03/27/2020  . Bipolar depression ([place]) 12/28/2019  . Sleep apnea 12/28/2019  . Acute lower UTI 10/31/2019  . Hypokalemia 10/31/2019  . Tachycardia 10/31/2019  . Urinary tract infection without hematuria   . Hyperlipidemia 10/30/2015  . Obesity due to excess calories 10/30/2015    Past Surgical History:  Procedure Laterality Date  . Joint fusion on foot Right   . NASAL SEPTUM SURGERY      Current Outpatient Rx  . Order #: [id number] Class: Historical Med  . Order #: [id number] Class: Normal  . Order #: [id number] Class: No Print  . Order #: [id number] Class: Historical Med    . Order #: [id number] Class: Normal  . Order #: [id number] Class: Normal  . Order #: [id number] Class: Normal  . Order #: [id number] Class: Normal  . Order #: [id number] Class: Normal  . Order #: [id number] Class: Historical Med  . Order #: [id number] Class: Historical Med  . Order #: [id number] Class: Historical Med  . Order #: [id number] Class: Historical Med  . Order #: [id number] Class: Normal  . Order #: [id number] Class: Historical Med    Allergies Keflex [cephalexin]  Family History  Problem Relation Age of Onset  . Hypertension Father   . Hyperlipidemia Father   . Diabetes Father   . Hypertension Mother   . Hyperlipidemia Mother   . Diabetes Mellitus II Mother   . Congestive Heart Failure Mother   . Hypertension Sister   . Diabetes Sister   . Hypertension Brother   . Diabetes Maternal Grandmother   . Lupus Paternal Grandmother     Social History Social History   Tobacco Use  . Smoking status: Former Smoker    Types: Cigarettes    Quit date: 12/30/1982    Years since quitting: 37.3  . Smokeless tobacco: Never Used  Substance Use Topics  . Alcohol use: Not Currently    Alcohol/week: 0.0 standard drinks  . Drug use: No    Review of Systems  All other systems negative except as documented in the HPI. All  pertinent positives and negatives as reviewed in the HPI. ____________________________________________  PHYSICAL EXAM:  VITAL SIGNS: ED Triage Vitals  Enc Vitals Group     BP 05/10/20 2207 140/80     Pulse Rate 05/10/20 2207 94     Resp 05/10/20 2207 18     Temp 05/10/20 2207 98.6 F (37 C)     Temp Source 05/10/20 2207 Oral     SpO2 05/10/20 2207 98 %     Weight 05/10/20 2209 250 lb (113.4 kg)     Height 05/10/20 2209 5\' 10"  (1.778 m)    Constitutional: Alert and oriented. Well appearing and in no acute distress. Eyes: Conjunctivae are normal. PERRL. EOMI. Head: Atraumatic. Nose: No congestion/rhinnorhea. Mouth/Throat: Mucous membranes are moist.  Oropharynx  non-erythematous. Neck: No stridor.  No meningeal signs.   Cardiovascular: Normal rate, regular rhythm. Good peripheral circulation. Grossly normal heart sounds.   Respiratory: Normal respiratory effort.  No retractions. Lungs CTAB. Gastrointestinal: Soft and nontender. No distention.  Musculoskeletal: No lower extremity tenderness nor edema. No gross deformities of extremities. Neurologic:  Normal speech and language. No gross focal neurologic deficits are appreciated.  Skin:  Skin is warm, dry and intact. No rash noted.  ____________________________________________   INITIAL IMPRESSION / ASSESSMENT AND PLAN / ED COURSE   This patient presents to the ED for concern of difficulty swallowing, this involves an extensive number of treatment options, and is a complaint that carries with it a high risk of complications and morbidity.  The differential diagnosis includes esophageal dysmotility, esophageal stricture, hiatal hernia, stroke.     Lab Tests:   None indicated  Medicines ordered:   I ordered medication lidocaine for dysphagia and odynophagia which seemed to improve to the allowed her to drink water without difficulty.  Imaging Studies ordered:   I independently visualized and interpreted imaging CT scans done week ago which showed no significant abnormalities  Additional history obtained:   Additional history obtained from no one  Previous records obtained and reviewed in epic   Consultations Obtained:   I consulted gastroenterology, Dr. Gala Romney, briefly on the phone and discussed lab and imaging findings who felt that she was okay for close follow-up.  She will call the office in the morning to get an appointment.   A medical screening exam was performed and I feel the patient has had an appropriate workup for their chief complaint at this time and likelihood of emergent condition existing is low. They have been counseled on decision, discharge, follow up and which  symptoms necessitate immediate return to the emergency department. They or their family verbally stated understanding and agreement with plan and discharged in stable condition.   ____________________________________________  FINAL CLINICAL IMPRESSION(S) / ED DIAGNOSES  Final diagnoses:  Esophageal dysphagia    MEDICATIONS GIVEN DURING THIS VISIT:  Medications  lidocaine (XYLOCAINE) 2 % viscous mouth solution 15 mL (15 mLs Mouth/Throat Given 05/10/20 2334)    NEW OUTPATIENT MEDICATIONS STARTED DURING THIS VISIT:  New Prescriptions   No medications on file    Note:  This note was prepared with assistance of Dragon voice recognition software. Occasional wrong-word or sound-a-like substitutions may have occurred due to the inherent limitations of voice recognition software.   Laila Myhre, Corene Cornea, MD 05/11/20 [phone]

## 2020-05-10 NOTE — ED Triage Notes (Signed)
Patient states that she has been having trouble swallowing pills over the last month and states that she took her medication tonight and feels like the pill is stuck in her throat. Patient states that she has tried to swallow water since then but the water comes back up.

## 2020-05-11 ENCOUNTER — Telehealth (INDEPENDENT_AMBULATORY_CARE_PROVIDER_SITE_OTHER): Payer: No Typology Code available for payment source | Admitting: Nurse Practitioner

## 2020-05-11 ENCOUNTER — Encounter: Payer: Self-pay | Admitting: Internal Medicine

## 2020-05-11 ENCOUNTER — Telehealth: Payer: Self-pay

## 2020-05-11 ENCOUNTER — Encounter: Payer: Self-pay | Admitting: Nurse Practitioner

## 2020-05-11 ENCOUNTER — Other Ambulatory Visit (HOSPITAL_COMMUNITY)
Admission: RE | Admit: 2020-05-11 | Discharge: 2020-05-11 | Disposition: A | Discharge status: Home / Self Care | Payer: No Typology Code available for payment source | Source: Ambulatory Visit | Attending: Internal Medicine | Admitting: Internal Medicine

## 2020-05-11 DIAGNOSIS — K219 Gastro-esophageal reflux disease without esophagitis: Secondary | ICD-10-CM | POA: Insufficient documentation

## 2020-05-11 DIAGNOSIS — R1310 Dysphagia, unspecified: Secondary | ICD-10-CM | POA: Diagnosis not present

## 2020-05-11 DIAGNOSIS — R1319 Other dysphagia: Secondary | ICD-10-CM

## 2020-05-11 LAB — COMPLETE METABOLIC PANEL WITH GFR
AG Ratio: 2 (calc) (ref 1.0–2.5)
ALT: 27 U/L (ref 6–29)
AST: 26 U/L (ref 10–35)
Albumin: 4.3 g/dL (ref 3.6–5.1)
Alkaline phosphatase (APISO): 68 U/L (ref 37–153)
BUN: 8 mg/dL (ref 7–25)
CO2: 31 mmol/L (ref 20–32)
Calcium: 10.2 mg/dL (ref 8.6–10.4)
Chloride: 97 mmol/L — ABNORMAL LOW (ref 98–110)
Creat: 0.99 mg/dL (ref 0.50–1.05)
GFR, Est African American: 75 mL/min/{1.73_m2} (ref >=60)
GFR, Est Non African American: 65 mL/min/{1.73_m2} (ref >=60)
Globulin: 2.1 g/dL (calc) (ref 1.9–3.7)
Glucose, Bld: 114 mg/dL — ABNORMAL HIGH (ref 65–99)
Potassium: 3.7 mmol/L (ref 3.5–5.3)
Sodium: 139 mmol/L (ref 135–146)
Total Bilirubin: 0.6 mg/dL (ref 0.2–1.2)
Total Protein: 6.4 g/dL (ref 6.1–8.1)

## 2020-05-11 LAB — SARS CORONAVIRUS 2 (TAT 6-24 HRS): SARS Coronavirus 2: NEGATIVE

## 2020-05-11 LAB — URINALYSIS W MICROSCOPIC + REFLEX CULTURE
Bacteria, UA: NONE SEEN /HPF
Bilirubin Urine: NEGATIVE
Glucose, UA: NEGATIVE
Hgb urine dipstick: NEGATIVE
Hyaline Cast: NONE SEEN /LPF
Ketones, ur: NEGATIVE
Leukocyte Esterase: NEGATIVE
Nitrites, Initial: NEGATIVE
Protein, ur: NEGATIVE
RBC / HPF: NONE SEEN /HPF (ref 0–2)
Specific Gravity, Urine: 1.01 (ref 1.001–1.03)
WBC, UA: NONE SEEN /HPF (ref 0–5)
pH: 8 (ref 5.0–8.0)

## 2020-05-11 LAB — NO CULTURE INDICATED

## 2020-05-11 NOTE — Progress Notes (Signed)
Referring Provider: Doree Albee, MD Primary Care Physician:  Doree Albee, MD Primary GI:  Dr. Gala Romney  NOTE: Service was provided via telemedicine and was requested by the patient due to COVID-19 pandemic.  Patient Location: Home  Provider Location: [place] office  Reason for Phone Visit: dysphagia  Persons present on the phone encounter, with roles: Patient, myself (provider),Mindy Estudillo (updated meds and allergies)  Total time (minutes) spent on medical discussion: 22 minutes  Due to COVID-19, visit was conducted using telephonic method (no video was available).  Visit was requested by patient.  Virtual Visit via Telephone only  I connected with Sydney Taylor on 05/11/20 at  9:00 AM EDT by video call and verified that I am speaking with the correct person using two identifiers.   I discussed the limitations, risks, security and privacy concerns of performing an evaluation and management service by telephone and the availability of in person appointments. I also discussed with the patient that there may be a patient responsible charge related to this service. The patient expressed understanding and agreed to proceed.  Chief Complaint  Patient presents with  . Dysphagia    recent 2 ER visit. Troubles with solids and some liquids, having to crush pills and still having hard time    HPI:   Sydney Taylor is a 54 y.o. female who presents for virtual visit regarding: dysphagia.  The patient was previously seen at [facility] in 2016.  At that time noted chronic several year history of GERD well controlled on AcipHex.  Recent hemoglobin 11.5, stool test positive for occult blood.  Burning upper chest pain for the past 6 weeks not associated with meals and does not change with eating.  This is noted to be very different to her prior GERD symptoms.  Recommended colonoscopy and EGD, continue AcipHex, consider abdominal imaging for further evaluation if no source is  determined on endoscopic evaluation.  I cannot find any endoscopic notes to indicate colonoscopy or endoscopy.  The patient presented to the emergency department yesterday which was a recurrent visit for dysphagia.  Noted a couple previous ER visits in the last few weeks for similar symptoms.  Has an EGD scheduled for Tuesday at an unknown system.  Noted progressively worsening dysphagia symptoms and feels like it is even difficult to swallow water.  Noted to be urinating normally.  Decreased intake of solid foods, occasional pill dysphagia and has to crush them up.  ER physician consulted with gastroenterology and recommended close follow-up including urgent office visit.  Reviewed labs associated with ER visit including CMP is essentially normal.  Urinalysis normal, urine culture not indicated.  Today she states she's doing ok overall. She's been having dysphagia symptoms. These started about a month ago with pill dysphagia. About 10 days ago started with solid food dysphagia. Has been crushing pills and still sticking. With liquids, feels like they're stuck too. No regurgitation of pills. States she cannot even get foods down. She is on liquid diet, including broth and meal replacement shakes. Pill issues are a concern due to DM, htn, and psych medications. GERD symptoms are not any worse, unsure if truly well controlled. Has GERD about once every couple weeks. She stopped AciPHex a "long time ago" and was just given Prilosec in the ER, but hasn't filled yet. Denies abdominal pain, N/V, hematochezia, melena, fever, chills, unintentional weight loss. Denies URI or flu-like symptoms. Denies loss of sense of taste or smell. The patient has not received  COVID-19 vaccination(s). They are interested in scheduling information. She was NOT tested for COVID in the ER. She was tested 2 weeks ago and negative (done in [facility] in [place]). Denies chest pain, dyspnea, dizziness, lightheadedness, syncope, near  syncope. Denies any other upper or lower GI symptoms.  Previous colonoscopy in 2017 with Dr. Posey Pronto in [place], [place]. Remote EGD 20+ years ago, cannot remember results.  Past Medical History:  Diagnosis Date  . Anemia   . Anxiety   . Arrhythmia   . Bipolar depression ([place]) 12/28/2019  . Chronic headaches   . Depression   . Diabetes mellitus without complication ([place])   . Fibromyalgia   . GERD (gastroesophageal reflux disease)   . HLD (hyperlipidemia)   . Hypertension   . Insect bite   . Prediabetes   . Sleep apnea   . Urinary incontinence     Past Surgical History:  Procedure Laterality Date  . Joint fusion on foot Right   . NASAL SEPTUM SURGERY      Current Outpatient Medications  Medication Sig Dispense Refill  . amitriptyline (ELAVIL) 25 MG tablet Take 25 mg by mouth at bedtime.     . chlorthalidone (HYGROTON) 50 MG tablet Take 0.5 tablets (25 mg total) by mouth daily. 90 tablet 0  . Cholecalciferol (VITAMIN D-3) 125 MCG (5000 UT) TABS Take 2 tablets by mouth daily.    . Exenatide ER (BYDUREON BCISE) 2 MG/0.85ML AUIJ Inject 2 mg into the skin once a week. 3.4 mL 3  . fluticasone (FLONASE) 50 MCG/ACT nasal spray Place 2 sprays into both nostrils daily. 16 g 0  . glipiZIDE (GLUCOTROL) 10 MG tablet Take 1 tablet (10 mg total) by mouth 2 (two) times daily before a meal. 180 tablet 0  . lamoTRIgine (LAMICTAL) 100 MG tablet Take 100 mg by mouth 2 (two) times daily.     Marland Kitchen lidocaine (XYLOCAINE) 2 % solution Use as directed 15 mLs in the mouth or throat every 3 (three) hours as needed for mouth pain. 300 mL 0  . losartan (COZAAR) 25 MG tablet Take 25 mg by mouth daily.    . metoprolol tartrate (LOPRESSOR) 50 MG tablet Take 50 mg by mouth 2 (two) times daily.     Marland Kitchen omeprazole (PRILOSEC) 20 MG capsule Take 20 mg by mouth every morning.    . potassium chloride 20 MEQ/15ML (10%) SOLN Take 15 mLs (20 mEq total) by mouth 3 (three) times daily. 630 mL 2  . REXULTI 2 MG TABS tablet Take 2  mg by mouth at bedtime.     . empagliflozin (JARDIANCE) 25 MG TABS tablet Take 25 mg by mouth daily before breakfast. (Patient not taking: Reported on 05/11/2020) 30 tablet 3   No current facility-administered medications for this visit.    Allergies as of 05/11/2020 - Review Complete 05/11/2020  Allergen Reaction Noted  . Keflex [cephalexin] Itching and Swelling 05/02/2020    Family History  Problem Relation Age of Onset  . Hypertension Father   . Hyperlipidemia Father   . Diabetes Father   . Hypertension Mother   . Hyperlipidemia Mother   . Diabetes Mellitus II Mother   . Congestive Heart Failure Mother   . Hypertension Sister   . Diabetes Sister   . Hypertension Brother   . Diabetes Maternal Grandmother   . Lupus Paternal Grandmother   . Colon cancer Neg Hx   . Esophageal cancer Neg Hx   . Gastric cancer Neg Hx  Social History   Socioeconomic History  . Marital status: Married    Spouse name: Not on file  . Number of children: 0  . Years of education: Not on file  . Highest education level: Not on file  Occupational History  . Occupation: research associate/drug development  Tobacco Use  . Smoking status: Former Smoker    Types: Cigarettes    Quit date: 12/30/1982    Years since quitting: 37.3  . Smokeless tobacco: Never Used  Substance and Sexual Activity  . Alcohol use: Not Currently    Alcohol/week: 0.0 standard drinks  . Drug use: No  . Sexual activity: Never  Other Topics Concern  . Not on file  Social History Narrative   Married for 3 years.Lives with husband.Bachelors in Education officer, museum.   Social Determinants of Health   Financial Resource Strain:   . Difficulty of Paying Living Expenses:   Food Insecurity:   . Worried About Charity fundraiser in the Last Year:   . Arboriculturist in the Last Year:   Transportation Needs:   . Film/video editor (Medical):   Marland Kitchen Lack of Transportation (Non-Medical):   Physical Activity:   . Days of Exercise per  Week:   . Minutes of Exercise per Session:   Stress:   . Feeling of Stress :   Social Connections:   . Frequency of Communication with Friends and Family:   . Frequency of Social Gatherings with Friends and Family:   . Attends Religious Services:   . Active Member of Clubs or Organizations:   . Attends Archivist Meetings:   Marland Kitchen Marital Status:     Review of Systems: Review of Systems  Constitutional: Negative for chills, fever, malaise/fatigue and weight loss.  HENT: Negative for congestion and sore throat.   Respiratory: Negative for cough and shortness of breath.   Cardiovascular: Negative for chest pain and palpitations.  Gastrointestinal: Negative for abdominal pain, blood in stool, diarrhea, melena, nausea and vomiting.       Worsening dysphagia  Musculoskeletal: Negative for joint pain and myalgias.  Skin: Negative for rash.  Neurological: Negative for dizziness and weakness.  Endo/Heme/Allergies: Does not bruise/bleed easily.  All other systems reviewed and are negative.   Physical Exam: Note: limited exam due to virtual visit LMP  (LMP Unknown)  Physical Exam Nursing note reviewed.  Constitutional:      General: She is not in acute distress.    Appearance: Normal appearance. She is well-developed. She is not ill-appearing, toxic-appearing or diaphoretic.  HENT:     Head: Normocephalic and atraumatic.     Nose: No congestion or rhinorrhea.  Eyes:     General: No scleral icterus. Pulmonary:     Effort: Pulmonary effort is normal. No respiratory distress.  Abdominal:     General: There is no distension.     Palpations: There is no hepatomegaly or splenomegaly.  Skin:    Coloration: Skin is not jaundiced.     Findings: No rash.  Neurological:     General: No focal deficit present.     Mental Status: She is alert and oriented to person, place, and time.  Psychiatric:        Attention and Perception: Attention normal.        Mood and Affect: Mood  normal. Affect is flat.        Speech: Speech normal.        Behavior: Behavior normal.  Thought Content: Thought content normal.        Cognition and Memory: Cognition and memory normal.

## 2020-05-11 NOTE — H&P (View-Only) (Signed)
Referring Provider: Doree Albee, MD Primary Care Physician:  Doree Albee, MD Primary GI:  Dr. Gala Romney  NOTE: Service was provided via telemedicine and was requested by the patient due to COVID-19 pandemic.  Patient Location: Home  Provider Location: [place] office  Reason for Phone Visit: dysphagia  Persons present on the phone encounter, with roles: Patient, myself (provider),Mindy Estudillo (updated meds and allergies)  Total time (minutes) spent on medical discussion: 22 minutes  Due to COVID-19, visit was conducted using telephonic method (no video was available).  Visit was requested by patient.  Virtual Visit via Telephone only  I connected with [name] on 05/11/20 at  9:00 AM EDT by video call and verified that I am speaking with the correct person using two identifiers.   I discussed the limitations, risks, security and privacy concerns of performing an evaluation and management service by telephone and the availability of in person appointments. I also discussed with the patient that there may be a patient responsible charge related to this service. The patient expressed understanding and agreed to proceed.  Chief Complaint  Patient presents with  . Dysphagia    recent 2 ER visit. Troubles with solids and some liquids, having to crush pills and still having hard time    HPI:   Sydney Taylor is a 54 y.o. female who presents for virtual visit regarding: dysphagia.  The patient was previously seen at [facility] in 2016.  At that time noted chronic several year history of GERD well controlled on AcipHex.  Recent hemoglobin 11.5, stool test positive for occult blood.  Burning upper chest pain for the past 6 weeks not associated with meals and does not change with eating.  This is noted to be very different to her prior GERD symptoms.  Recommended colonoscopy and EGD, continue AcipHex, consider abdominal imaging for further evaluation if no source is  determined on endoscopic evaluation.  I cannot find any endoscopic notes to indicate colonoscopy or endoscopy.  The patient presented to the emergency department yesterday which was a recurrent visit for dysphagia.  Noted a couple previous ER visits in the last few weeks for similar symptoms.  Has an EGD scheduled for Tuesday at an unknown system.  Noted progressively worsening dysphagia symptoms and feels like it is even difficult to swallow water.  Noted to be urinating normally.  Decreased intake of solid foods, occasional pill dysphagia and has to crush them up.  ER physician consulted with gastroenterology and recommended close follow-up including urgent office visit.  Reviewed labs associated with ER visit including CMP is essentially normal.  Urinalysis normal, urine culture not indicated.  Today she states she's doing ok overall. She's been having dysphagia symptoms. These started about a month ago with pill dysphagia. About 10 days ago started with solid food dysphagia. Has been crushing pills and still sticking. With liquids, feels like they're stuck too. No regurgitation of pills. States she cannot even get foods down. She is on liquid diet, including broth and meal replacement shakes. Pill issues are a concern due to DM, htn, and psych medications. GERD symptoms are not any worse, unsure if truly well controlled. Has GERD about once every couple weeks. She stopped AciPHex a "long time ago" and was just given Prilosec in the ER, but hasn't filled yet. Denies abdominal pain, N/V, hematochezia, melena, fever, chills, unintentional weight loss. Denies URI or flu-like symptoms. Denies loss of sense of taste or smell. The patient has not received  COVID-19 vaccination(s). They are interested in scheduling information. She was NOT tested for COVID in the ER. She was tested 2 weeks ago and negative (done in [facility] in [place]). Denies chest pain, dyspnea, dizziness, lightheadedness, syncope, near  syncope. Denies any other upper or lower GI symptoms.  Previous colonoscopy in 2017 with Dr. Posey Pronto in [place], [place]. Remote EGD 20+ years ago, cannot remember results.  Past Medical History:  Diagnosis Date  . Anemia   . Anxiety   . Arrhythmia   . Bipolar depression ([place]) 12/28/2019  . Chronic headaches   . Depression   . Diabetes mellitus without complication ([place])   . Fibromyalgia   . GERD (gastroesophageal reflux disease)   . HLD (hyperlipidemia)   . Hypertension   . Insect bite   . Prediabetes   . Sleep apnea   . Urinary incontinence     Past Surgical History:  Procedure Laterality Date  . Joint fusion on foot Right   . NASAL SEPTUM SURGERY      Current Outpatient Medications  Medication Sig Dispense Refill  . amitriptyline (ELAVIL) 25 MG tablet Take 25 mg by mouth at bedtime.     . chlorthalidone (HYGROTON) 50 MG tablet Take 0.5 tablets (25 mg total) by mouth daily. 90 tablet 0  . Cholecalciferol (VITAMIN D-3) 125 MCG (5000 UT) TABS Take 2 tablets by mouth daily.    . Exenatide ER (BYDUREON BCISE) 2 MG/0.85ML AUIJ Inject 2 mg into the skin once a week. 3.4 mL 3  . fluticasone (FLONASE) 50 MCG/ACT nasal spray Place 2 sprays into both nostrils daily. 16 g 0  . glipiZIDE (GLUCOTROL) 10 MG tablet Take 1 tablet (10 mg total) by mouth 2 (two) times daily before a meal. 180 tablet 0  . lamoTRIgine (LAMICTAL) 100 MG tablet Take 100 mg by mouth 2 (two) times daily.     Marland Kitchen lidocaine (XYLOCAINE) 2 % solution Use as directed 15 mLs in the mouth or throat every 3 (three) hours as needed for mouth pain. 300 mL 0  . losartan (COZAAR) 25 MG tablet Take 25 mg by mouth daily.    . metoprolol tartrate (LOPRESSOR) 50 MG tablet Take 50 mg by mouth 2 (two) times daily.     Marland Kitchen omeprazole (PRILOSEC) 20 MG capsule Take 20 mg by mouth every morning.    . potassium chloride 20 MEQ/15ML (10%) SOLN Take 15 mLs (20 mEq total) by mouth 3 (three) times daily. 630 mL 2  . REXULTI 2 MG TABS tablet Take 2  mg by mouth at bedtime.     . empagliflozin (JARDIANCE) 25 MG TABS tablet Take 25 mg by mouth daily before breakfast. (Patient not taking: Reported on 05/11/2020) 30 tablet 3   No current facility-administered medications for this visit.    Allergies as of 05/11/2020 - Review Complete 05/11/2020  Allergen Reaction Noted  . Keflex [cephalexin] Itching and Swelling 05/02/2020    Family History  Problem Relation Age of Onset  . Hypertension Father   . Hyperlipidemia Father   . Diabetes Father   . Hypertension Mother   . Hyperlipidemia Mother   . Diabetes Mellitus II Mother   . Congestive Heart Failure Mother   . Hypertension Sister   . Diabetes Sister   . Hypertension Brother   . Diabetes Maternal Grandmother   . Lupus Paternal Grandmother   . Colon cancer Neg Hx   . Esophageal cancer Neg Hx   . Gastric cancer Neg Hx  Social History   Socioeconomic History  . Marital status: Married    Spouse name: Not on file  . Number of children: 0  . Years of education: Not on file  . Highest education level: Not on file  Occupational History  . Occupation: research associate/drug development  Tobacco Use  . Smoking status: Former Smoker    Types: Cigarettes    Quit date: 12/30/1982    Years since quitting: 37.3  . Smokeless tobacco: Never Used  Substance and Sexual Activity  . Alcohol use: Not Currently    Alcohol/week: 0.0 standard drinks  . Drug use: No  . Sexual activity: Never  Other Topics Concern  . Not on file  Social History Narrative   Married for 3 years.Lives with husband.Bachelors in Education officer, museum.   Social Determinants of Health   Financial Resource Strain:   . Difficulty of Paying Living Expenses:   Food Insecurity:   . Worried About Charity fundraiser in the Last Year:   . Arboriculturist in the Last Year:   Transportation Needs:   . Film/video editor (Medical):   Marland Kitchen Lack of Transportation (Non-Medical):   Physical Activity:   . Days of Exercise per  Week:   . Minutes of Exercise per Session:   Stress:   . Feeling of Stress :   Social Connections:   . Frequency of Communication with Friends and Family:   . Frequency of Social Gatherings with Friends and Family:   . Attends Religious Services:   . Active Member of Clubs or Organizations:   . Attends Archivist Meetings:   Marland Kitchen Marital Status:     Review of Systems: Review of Systems  Constitutional: Negative for chills, fever, malaise/fatigue and weight loss.  HENT: Negative for congestion and sore throat.   Respiratory: Negative for cough and shortness of breath.   Cardiovascular: Negative for chest pain and palpitations.  Gastrointestinal: Negative for abdominal pain, blood in stool, diarrhea, melena, nausea and vomiting.       Worsening dysphagia  Musculoskeletal: Negative for joint pain and myalgias.  Skin: Negative for rash.  Neurological: Negative for dizziness and weakness.  Endo/Heme/Allergies: Does not bruise/bleed easily.  All other systems reviewed and are negative.   Physical Exam: Note: limited exam due to virtual visit LMP  (LMP Unknown)  Physical Exam Nursing note reviewed.  Constitutional:      General: She is not in acute distress.    Appearance: Normal appearance. She is well-developed. She is not ill-appearing, toxic-appearing or diaphoretic.  HENT:     Head: Normocephalic and atraumatic.     Nose: No congestion or rhinorrhea.  Eyes:     General: No scleral icterus. Pulmonary:     Effort: Pulmonary effort is normal. No respiratory distress.  Abdominal:     General: There is no distension.     Palpations: There is no hepatomegaly or splenomegaly.  Skin:    Coloration: Skin is not jaundiced.     Findings: No rash.  Neurological:     General: No focal deficit present.     Mental Status: She is alert and oriented to person, place, and time.  Psychiatric:        Attention and Perception: Attention normal.        Mood and Affect: Mood  normal. Affect is flat.        Speech: Speech normal.        Behavior: Behavior normal.  Thought Content: Thought content normal.        Cognition and Memory: Cognition and memory normal.

## 2020-05-11 NOTE — Telephone Encounter (Signed)
Called pt to schedule EGD/DIL w/RMR for tomorrow as advised by EG. Procedure scheduled for 05/12/20 at 11:15am. Pt needs COVID test this morning. Asked her what time she can be at [facility] this morning for test. She said she will have to call someone to take her and will call office back in a few minutes.

## 2020-05-11 NOTE — Assessment & Plan Note (Signed)
Progressive solid food and pill dysphagia over the past month.  This is in the setting of chronic GERD, not previously on a PPI.  She did note breakthrough about every couple weeks.  Likely more persistent silent GERD given her history and her now progressive dysphagia.  We will request previous colonoscopy from Dr. Posey Pronto in [place], [place].  We will proceed with EGD ASAP for dysphagia.  Recommend continue liquids for now.  Follow-up in 2 months.  Proceed with EGD +/- dilation with Dr. Gala Romney in near future: the risks, benefits, and alternatives have been discussed with the patient in detail. The patient states understanding and desires to proceed.  The patient is currently on Elavil.  She is on diabetes medications and will need to be altered prior to her procedure.The patient is not on any other anticoagulants, anxiolytics, chronic pain medications, antidepressants, antidiabetics, or iron supplements.  Conscious sedation should be adequate for her procedure.

## 2020-05-11 NOTE — Assessment & Plan Note (Signed)
Chronic history of GERD, previously on AcipHex that she states she has not taken in a long time.  She was prescribed omeprazole by the emergency room but she has not had this filled yet.  I recommended she fill and start omeprazole.  We will follow-up after her EGD in 2 months to see how she is doing.  Further recommendations to follow pending her endoscopic evaluation.

## 2020-05-11 NOTE — Progress Notes (Signed)
Cc'ed to pcp °

## 2020-05-11 NOTE — Patient Instructions (Signed)
Your health issues we discussed today were:   Dysphagia (swallowing difficulties): 1. As we discussed we will plan for an upper endoscopy with possible dilation with Dr. Work, likely tomorrow 2. Further recommendations will follow your endoscopy 3. As we discussed, you will need to have a Covid test today prior to your procedure  GERD (reflux/heartburn): 1. Start taking omeprazole (Prilosec) once a day that was prescribed by the emergency room 2. Call us if you have any worsening or severe symptoms  Overall I recommend:  1. Continue your other current medications 2. Return for follow-up in 2 months 3. Call us if you have any questions or concerns.   ---------------------------------------------------------------  COVID-19 Vaccine Information can be found at: ShippingScam.co.uk For questions related to vaccine distribution or appointments, please email vaccine@[email or web address] .com or call [phone].   ---------------------------------------------------------------   At [facility] we value your feedback. You may receive a survey about your visit today. Please share your experience as we strive to create trusting relationships with our patients to provide genuine, compassionate, quality care.  We appreciate your understanding and patience as we review any laboratory studies, imaging, and other diagnostic tests that are ordered as we care for you. Our office policy is 5 business days for review of these results, and any emergent or urgent results are addressed in a timely manner for your best interest. If you do not hear from our office in 1 week, please contact us.   We also encourage the use of MyChart, which contains your medical information for your review as well. If you are not enrolled in this feature, an access code is on this after visit summary for your convenience. Thank you for allowing [place] to be involved in  your care.  It was great to see you today!  I hope you have a great Summer!!

## 2020-05-11 NOTE — Telephone Encounter (Signed)
Pt called office, she can be at [facility] for COVID test at 11:00am. COVID test scheduled for today at 11:05am. EGD instructions given on phone and sent to pt via [place]. Endo scheduler informed pt was added on for COVID test.

## 2020-05-11 NOTE — Telephone Encounter (Signed)
Called [employer] for PA of EGD/DIL. Spoke to [place], case initiated. Ref# [id number], valid 05/12/20-08/10/20. Clinical notes faxed to clinical review dept (fax# [phone]). Fax marked to be expedited.

## 2020-05-12 ENCOUNTER — Encounter (HOSPITAL_COMMUNITY)
Admission: RE | Disposition: A | Discharge status: Home / Self Care | Payer: Self-pay | Source: Home / Self Care | Attending: Internal Medicine

## 2020-05-12 ENCOUNTER — Encounter (HOSPITAL_COMMUNITY): Payer: Self-pay | Admitting: Internal Medicine

## 2020-05-12 ENCOUNTER — Telehealth: Payer: Self-pay | Admitting: Internal Medicine

## 2020-05-12 ENCOUNTER — Ambulatory Visit (HOSPITAL_COMMUNITY)
Admission: RE | Admit: 2020-05-12 | Discharge: 2020-05-12 | Disposition: A | Discharge status: Home / Self Care | Payer: No Typology Code available for payment source | Attending: Internal Medicine | Admitting: Internal Medicine

## 2020-05-12 ENCOUNTER — Other Ambulatory Visit: Payer: Self-pay

## 2020-05-12 DIAGNOSIS — F319 Bipolar disorder, unspecified: Secondary | ICD-10-CM | POA: Insufficient documentation

## 2020-05-12 DIAGNOSIS — Z7984 Long term (current) use of oral hypoglycemic drugs: Secondary | ICD-10-CM | POA: Insufficient documentation

## 2020-05-12 DIAGNOSIS — K222 Esophageal obstruction: Secondary | ICD-10-CM | POA: Insufficient documentation

## 2020-05-12 DIAGNOSIS — Z87891 Personal history of nicotine dependence: Secondary | ICD-10-CM | POA: Insufficient documentation

## 2020-05-12 DIAGNOSIS — I10 Essential (primary) hypertension: Secondary | ICD-10-CM | POA: Diagnosis not present

## 2020-05-12 DIAGNOSIS — E119 Type 2 diabetes mellitus without complications: Secondary | ICD-10-CM | POA: Diagnosis not present

## 2020-05-12 DIAGNOSIS — K2100 Gastro-esophageal reflux disease with esophagitis, without bleeding: Secondary | ICD-10-CM | POA: Diagnosis not present

## 2020-05-12 DIAGNOSIS — R079 Chest pain, unspecified: Secondary | ICD-10-CM | POA: Insufficient documentation

## 2020-05-12 DIAGNOSIS — K219 Gastro-esophageal reflux disease without esophagitis: Secondary | ICD-10-CM

## 2020-05-12 DIAGNOSIS — R1310 Dysphagia, unspecified: Secondary | ICD-10-CM | POA: Diagnosis not present

## 2020-05-12 DIAGNOSIS — R633 Feeding difficulties, unspecified: Secondary | ICD-10-CM

## 2020-05-12 DIAGNOSIS — R1319 Other dysphagia: Secondary | ICD-10-CM

## 2020-05-12 DIAGNOSIS — Z79899 Other long term (current) drug therapy: Secondary | ICD-10-CM | POA: Diagnosis not present

## 2020-05-12 DIAGNOSIS — F419 Anxiety disorder, unspecified: Secondary | ICD-10-CM | POA: Insufficient documentation

## 2020-05-12 DIAGNOSIS — G4730 Sleep apnea, unspecified: Secondary | ICD-10-CM | POA: Diagnosis not present

## 2020-05-12 HISTORY — PX: MALONEY DILATION: SHX5535

## 2020-05-12 HISTORY — PX: ESOPHAGOGASTRODUODENOSCOPY: SHX5428

## 2020-05-12 LAB — GLUCOSE, CAPILLARY: Glucose-Capillary: 142 mg/dL — ABNORMAL HIGH (ref 70–99)

## 2020-05-12 SURGERY — EGD (ESOPHAGOGASTRODUODENOSCOPY)
Anesthesia: Moderate Sedation

## 2020-05-12 MED ORDER — MIDAZOLAM HCL 5 MG/5ML IJ SOLN
INTRAMUSCULAR | Status: DC | PRN
  Administered 2020-05-12 (×4): 2 mg via INTRAVENOUS

## 2020-05-12 MED ORDER — MEPERIDINE HCL 50 MG/ML IJ SOLN
INTRAMUSCULAR | Status: AC
  Filled 2020-05-12: qty 1

## 2020-05-12 MED ORDER — LIDOCAINE VISCOUS HCL 2 % MT SOLN
OROMUCOSAL | Status: AC
  Filled 2020-05-12: qty 15

## 2020-05-12 MED ORDER — ONDANSETRON HCL 4 MG/2ML IJ SOLN
INTRAMUSCULAR | Status: DC | PRN
  Administered 2020-05-12: 4 mg via INTRAVENOUS

## 2020-05-12 MED ORDER — MEPERIDINE HCL 100 MG/ML IJ SOLN
INTRAMUSCULAR | Status: DC | PRN
  Administered 2020-05-12: 25 mg
  Administered 2020-05-12: 10 mg
  Administered 2020-05-12: 15 mg

## 2020-05-12 MED ORDER — ONDANSETRON HCL 4 MG/2ML IJ SOLN
INTRAMUSCULAR | Status: AC
  Filled 2020-05-12: qty 2

## 2020-05-12 MED ORDER — LIDOCAINE VISCOUS HCL 2 % MT SOLN
OROMUCOSAL | Status: DC | PRN
  Administered 2020-05-12: 1 via OROMUCOSAL

## 2020-05-12 MED ORDER — MIDAZOLAM HCL 5 MG/5ML IJ SOLN
INTRAMUSCULAR | Status: AC
  Filled 2020-05-12: qty 10

## 2020-05-12 MED ORDER — SODIUM CHLORIDE 0.9 % IV SOLN: INTRAVENOUS | Status: DC

## 2020-05-12 NOTE — Telephone Encounter (Signed)
Patient called me this evening.  Concerned she was still having difficulty swallowing pills.  Having no pain or fever.  Today, underwent an EGD by me which revealed a soft peptic stricture and reflux esophagitis.  Maloney dilation performed.  Look back revealed no issues concerning for complication.  Twice daily PPI regimen recommended.  I recommended that she allow herself a little time to get over the procedure; continue medication as recommended today.  We will plan to see her back at her scheduled office visit.

## 2020-05-12 NOTE — Discharge Instructions (Signed)
EGD Discharge instructions Please read the instructions outlined below and refer to this sheet in the next few weeks. These discharge instructions provide you with general information on caring for yourself after you leave the hospital. Your doctor may also give you specific instructions. While your treatment has been planned according to the most current medical practices available, unavoidable complications occasionally occur. If you have any problems or questions after discharge, please call your doctor. ACTIVITY  You may resume your regular activity but move at a slower pace for the next 24 hours.   Take frequent rest periods for the next 24 hours.   Walking will help expel (get rid of) the air and reduce the bloated feeling in your abdomen.   No driving for 24 hours (because of the anesthesia (medicine) used during the test).   You may shower.   Do not sign any important legal documents or operate any machinery for 24 hours (because of the anesthesia used during the test).  NUTRITION  Drink plenty of fluids.   You may resume your normal diet.   Begin with a light meal and progress to your normal diet.   Avoid alcoholic beverages for 24 hours or as instructed by your caregiver.  MEDICATIONS  You may resume your normal medications unless your caregiver tells you otherwise.  WHAT YOU CAN EXPECT TODAY  You may experience abdominal discomfort such as a feeling of fullness or "gas" pains.  FOLLOW-UP  Your doctor will discuss the results of your test with you.  SEEK IMMEDIATE MEDICAL ATTENTION IF ANY OF THE FOLLOWING OCCUR:  Excessive nausea (feeling sick to your stomach) and/or vomiting.   Severe abdominal pain and distention (swelling).   Trouble swallowing.   Temperature over 101 F (37.8 C).   Rectal bleeding or vomiting of blood.    You have narrowing in your esophagus from longstanding acid reflux.  No sign of tumor.  Your esophagus was stretched today.  If you  follow my instructions your symptoms will all improve significantly  Begin Protonix 40 mg twice daily  Office visit with [place] (EG) in 6 to 8 weeks  At patient request, I called Traci Sermon, husband, at [phone]  -reviewed results

## 2020-05-12 NOTE — Interval H&P Note (Signed)
History and Physical Interval Note:  05/12/2020 10:56 AM  Sydney Taylor  has presented today for surgery, with the diagnosis of dysphagia, GERD.  The various methods of treatment have been discussed with the patient and family. After consideration of risks, benefits and other options for treatment, the patient has consented to  Procedure(s) with comments: ESOPHAGOGASTRODUODENOSCOPY (EGD) (N/A) - 11:15am MALONEY DILATION (N/A) as a surgical intervention.  The patient's history has been reviewed, patient examined, no change in status, stable for surgery.  I have reviewed the patient's chart and labs.  Questions were answered to the patient's satisfaction.     Tekla Malachowski   No change.  EGD with possible esophageal dilation as feasible/appropriate today per plan.  The risks, benefits, limitations, alternatives and imponderables have been reviewed with the patient. Potential for esophageal dilation, biopsy, etc. have also been reviewed.  Questions have been answered. All parties agreeable.

## 2020-05-12 NOTE — Op Note (Addendum)
[facility] Patient Name: Sydney Taylor Procedure Date: 05/12/2020 9:55 AM MRN: [id number] Date of Birth: 1966/02/26 Attending MD: Norvel Richards , MD CSN: [id number] Age: 54 Admit Type: Outpatient Procedure:                Upper GI endoscopy Indications:              Dysphagia Providers:                Norvel Richards, MD, Janeece Riggers, RN, Raphael Gibney, Technician Referring MD:              Medicines:                Midazolam 9 mg IV, Meperidine 50 mg IV Complications:            No immediate complications. Estimated blood loss:                            Minimal. Estimated Blood Loss:     Estimated blood loss was minimal. Procedure:                Pre-Anesthesia Assessment:                           - Prior to the procedure, a History and Physical                            was performed, and patient medications and                            allergies were reviewed. The patient's tolerance of                            previous anesthesia was also reviewed. The risks                            and benefits of the procedure and the sedation                            options and risks were discussed with the patient.                            All questions were answered, and informed consent                            was obtained. Prior Anticoagulants: The patient has                            taken no previous anticoagulant or antiplatelet                            agents. ASA Grade Assessment: II - A patient with  mild systemic disease. After reviewing the risks                            and benefits, the patient was deemed in                            satisfactory condition to undergo the procedure.                           After obtaining informed consent, the endoscope was                            passed under direct vision. Throughout the                            procedure, the patient's blood  pressure, pulse, and                            oxygen saturations were monitored continuously. The                            [id number]) scope was introduced through the                            mouth, and advanced to the second part of duodenum.                            The upper GI endoscopy was accomplished without                            difficulty. The patient tolerated the procedure                            well. Scope In: 11:12:01 AM Scope Out: 11:19:29 AM Total Procedure Duration: 0 hours 7 minutes 28 seconds  Findings:      Soft noncritical peptic stricture at the GE junction. Circumferential       esophageal erosions within 5 to 7 mm of the GE junction. No tumor. No       Barrett's epithelium seen.      The entire examined stomach was normal.      The duodenal bulb and second portion of the duodenum were normal. Scope       was withdrawn and a 54 [place] Maloney dilator was passed to full       insertion with mild resistance. A look back revealed improvement in the       narrowing produced by the stricture. Minimal bleeding. No complication. Impression:               -Erosive reflux esophagitis with soft peptic                            stricture?"status post dilation. Moderate Sedation:      Moderate (conscious) sedation was administered by the endoscopy nurse       and supervised by the endoscopist. The following parameters were  monitored: oxygen saturation, heart rate, blood pressure, respiratory       rate, EKG, adequacy of pulmonary ventilation, and response to care.       Total physician intraservice time was 20 minutes. Recommendation:           - Patient has a contact number available for                            emergencies. The signs and symptoms of potential                            delayed complications were discussed with the                            patient. Return to normal activities tomorrow.                            Written  discharge instructions were provided to the                            patient.                           - Advance diet as tolerated. Begin Protonix 40 mg                            twice daily. Office visit with [place] in 6 weeks. Procedure Code(s):        --- Professional ---                           [phone], Esophagogastroduodenoscopy, flexible,                            transoral; diagnostic, including collection of                            specimen(s) by brushing or washing, when performed                            (separate procedure)                           G0500, Moderate sedation services provided by the                            same physician or other qualified health care                            professional performing a gastrointestinal                            endoscopic service that sedation supports,                            requiring the presence of an independent trained  observer to assist in the monitoring of the                            patient's level of consciousness and physiological                            status; initial 15 minutes of intra-service time;                            patient age 12 years or older (additional time may                            be reported with [phone], as appropriate) Diagnosis Code(s):        --- Professional ---                           R13.10, Dysphagia, unspecified CPT copyright 2019 American Medical Association. All rights reserved. The codes documented in this report are preliminary and upon coder review may  be revised to meet current compliance requirements. Cristopher Estimable. Burgandy Hackworth, MD Norvel Richards, MD 05/12/2020 11:36:27 AM This report has been signed electronically. Number of Addenda: 0

## 2020-05-15 ENCOUNTER — Telehealth: Payer: Self-pay | Admitting: Internal Medicine

## 2020-05-15 NOTE — Telephone Encounter (Signed)
Another phone not was sent to provider.

## 2020-05-15 NOTE — Telephone Encounter (Signed)
Procedure approved. PA# [id number], valid 05/12/20-08/19/20.

## 2020-05-15 NOTE — Telephone Encounter (Signed)
Referral sent to speech

## 2020-05-15 NOTE — Telephone Encounter (Signed)
This lady's symptoms appear to be more clearly in the realm globus hystericus to me.  She has a widely patent upper GI tract.  Significant anxiety/depressive component in my opinion at this time.  I think she would benefit from a swallowing evaluation for evaluation management via speech pathology.  Lets make the referral.

## 2020-05-15 NOTE — Telephone Encounter (Signed)
Spoke with pt. Pt would like to try Nexium granules. Please send to [facility] [place], [place].   Pt is in agreement with speech pathology evaluation, please arrange.

## 2020-05-15 NOTE — Telephone Encounter (Signed)
Unfortunately pantoprazole (Protonix) cannot be crushed. We could try Nexium granules that are mixed with a small amount of water (15 mL or approx 1-2 oz), allowed to thicken, then drank. I'm not sure how much it could be with her insurance, but we can try sending it to see.  Let me know if she would like to try this.

## 2020-05-15 NOTE — Telephone Encounter (Signed)
Routing to [facility].  

## 2020-05-15 NOTE — Telephone Encounter (Signed)
Pt called this morning saying she had an EGD on Friday and she is starting to have problems again where she can't swallow her pills. [phone]

## 2020-05-15 NOTE — Telephone Encounter (Signed)
Pt called this morning 05/15/20 with c/o swallowing her medication. Pt isn't able to get her pantoprazole down. Pt would like to know if the pantoprazole can be crushed and taken with water? Pt states that she is crushing her other medications and taking it with a sip of water. Pt isn't able to get applesauce down to take her mediation. Pt would like to know how much time she should give to heal from the procedure? Pt also reports a little soreness when swallowing. Pt is aware that it is normal for her to feel some soreness s/p an EGD and pt can use otc Chloraseptic spray if needed.

## 2020-05-15 NOTE — Telephone Encounter (Signed)
Noted  

## 2020-05-15 NOTE — Addendum Note (Signed)
Addended by: Cheron Every on: 05/15/2020 10:40 AM   Modules accepted: Orders

## 2020-05-16 NOTE — Telephone Encounter (Signed)
VM received today 05/16/20. Pt would like to know where the referral was placed for speech and pt would like the Nexium called in. Returned call. Pt is aware that referral was sent to [facility]. EG please send RX to pts pharmacy

## 2020-05-16 NOTE — Addendum Note (Signed)
Addended by: Cheron Every on: 05/16/2020 11:18 AM   Modules accepted: Orders

## 2020-05-17 ENCOUNTER — Other Ambulatory Visit: Payer: Self-pay | Admitting: Nurse Practitioner

## 2020-05-17 DIAGNOSIS — R633 Feeding difficulties, unspecified: Secondary | ICD-10-CM

## 2020-05-17 DIAGNOSIS — K219 Gastro-esophageal reflux disease without esophagitis: Secondary | ICD-10-CM

## 2020-05-17 DIAGNOSIS — R1319 Other dysphagia: Secondary | ICD-10-CM

## 2020-05-17 MED ORDER — ESOMEPRAZOLE MAGNESIUM 40 MG PO PACK: 40.0000 mg | PACK | Freq: Every day | ORAL | 3 refills | Status: DC

## 2020-05-17 NOTE — Telephone Encounter (Signed)
Noted  

## 2020-05-17 NOTE — Telephone Encounter (Signed)
Rx sent to pharmacy   

## 2020-05-17 NOTE — Addendum Note (Signed)
Addended by: Gordy Levan, Della Scrivener A on: 05/17/2020 04:24 PM   Modules accepted: Orders

## 2020-05-18 ENCOUNTER — Telehealth: Payer: No Typology Code available for payment source | Admitting: Gastroenterology

## 2020-05-18 ENCOUNTER — Other Ambulatory Visit (HOSPITAL_COMMUNITY): Payer: Self-pay | Admitting: Specialist

## 2020-05-18 DIAGNOSIS — R633 Feeding difficulties, unspecified: Secondary | ICD-10-CM

## 2020-05-18 DIAGNOSIS — R1319 Other dysphagia: Secondary | ICD-10-CM

## 2020-05-19 ENCOUNTER — Encounter: Payer: Self-pay | Admitting: Nurse Practitioner

## 2020-05-22 ENCOUNTER — Ambulatory Visit (HOSPITAL_COMMUNITY): Payer: No Typology Code available for payment source | Attending: Internal Medicine | Admitting: Speech Pathology

## 2020-05-22 ENCOUNTER — Other Ambulatory Visit: Payer: Self-pay

## 2020-05-22 ENCOUNTER — Ambulatory Visit (HOSPITAL_COMMUNITY)
Admission: RE | Admit: 2020-05-22 | Discharge: 2020-05-22 | Disposition: A | Discharge status: Home / Self Care | Payer: No Typology Code available for payment source | Source: Ambulatory Visit | Attending: Internal Medicine | Admitting: Internal Medicine

## 2020-05-22 ENCOUNTER — Encounter (HOSPITAL_COMMUNITY): Payer: Self-pay | Admitting: Speech Pathology

## 2020-05-22 DIAGNOSIS — R633 Feeding difficulties, unspecified: Secondary | ICD-10-CM

## 2020-05-22 DIAGNOSIS — R1310 Dysphagia, unspecified: Secondary | ICD-10-CM | POA: Insufficient documentation

## 2020-05-22 DIAGNOSIS — R1312 Dysphagia, oropharyngeal phase: Secondary | ICD-10-CM | POA: Insufficient documentation

## 2020-05-22 DIAGNOSIS — R1319 Other dysphagia: Secondary | ICD-10-CM

## 2020-05-22 NOTE — Therapy (Signed)
[place], [place], [address] Phone: [phone]   Fax:  [phone]  Modified Barium Swallow  Patient Details  Name: Sydney Taylor MRN: [id number] Date of Birth: 1966/07/20 No data recorded  Encounter Date: 05/22/2020  End of Session - 05/22/20 1247    Visit Number  1    Number of Visits  4    Date for SLP Re-Evaluation  06/26/20    Authorization Type  [employer] EFF 05/08/2020 PLAN YR$2,500.00 DED/ 0 MET20% CO-INSURANCEOOP MAX $7,900.00/ $0 METNO AUTH REQ30 COMB VISIT LIMIT PT/OT/ST/ HARD LIMIT/ 0 USED   SLP Start Time  1130    SLP Stop Time   1210    SLP Time Calculation (min)  40 min    Activity Tolerance  Patient tolerated treatment well       Past Medical History:  Diagnosis Date  . Anemia   . Anxiety   . Arrhythmia   . Bipolar depression ([place]) 12/28/2019  . Chronic headaches   . Depression   . Diabetes mellitus without complication ([employer])   . Fibromyalgia   . GERD (gastroesophageal reflux disease)   . HLD (hyperlipidemia)   . Hypertension   . Insect bite   . Prediabetes   . Sleep apnea   . Urinary incontinence     Past Surgical History:  Procedure Laterality Date  . ESOPHAGOGASTRODUODENOSCOPY N/A 05/12/2020   Procedure: ESOPHAGOGASTRODUODENOSCOPY (EGD);  Surgeon: Daneil Dolin, MD;  Location: [facility];  Service: Endoscopy;  Laterality: N/A;  11:15am  . Joint fusion on foot Right   . MALONEY DILATION N/A 05/12/2020   Procedure: Venia Minks DILATION;  Surgeon: Daneil Dolin, MD;  Location: [facility];  Service: Endoscopy;  Laterality: N/A;  . NASAL SEPTUM SURGERY      There were no vitals filed for this visit.  Subjective Assessment - 05/22/20 1234    Subjective  "I can't swallow pills or solid foods."    Special Tests  MBSS    Currently in Pain?  No/denies        General - 05/22/20 1236      General Information   Date of Onset  05/01/20    HPI  Sydney Taylor  is a 54 yo female who was referred by Dr. Manus Rudd for MBSS due to Pt with reports if inability to swallow solid foods and pills which started sometime in April 2021. Pt has had numerous visits to the ED for similar complaints (dysphagiaJ). She tells me that she feels the onset was more acute than slow and began with a pill. She reports globus sensation with all solids and pills and is now only consuming liquids. She underwent EGD on 05/12/20 with dilation and reports initial improvement in symptoms, but then quickly dysphagia returned. Pt indicates that she does have social, emotional, and financial stressors at home.   Type of Study  MBS-Modified Barium Swallow Study    Previous Swallow Assessment  Pt had EGD 05/12/20: soft peptic stricture and reflux esophagitis, dilation performed    Diet Prior to this Study  Regular;Thin liquids    Temperature Spikes Noted  No    Respiratory Status  Room air    History of Recent Intubation  No    Behavior/Cognition  Alert;Cooperative;Pleasant mood    Oral Cavity Assessment  Within Functional Limits    Oral Care Completed by SLP  No    Oral Cavity - Dentition  Adequate natural dentition    Vision  Functional for self feeding    Self-Feeding Abilities  Able to feed self    Patient Positioning  Upright in chair    Baseline Vocal Quality  Normal    Volitional Cough  Strong    Volitional Swallow  Able to elicit    Anatomy  Within functional limits    Pharyngeal Secretions  Not observed secondary MBS         Oral Preparation/Oral Phase - 05/22/20 1238      Oral Preparation/Oral Phase   Oral Phase  Within functional limits   Pt initially with prolonged oral prep with puree, see below      Pharyngeal Phase - 05/22/20 1239      Pharyngeal Phase   Pharyngeal Phase  Impaired      Pharyngeal - Thin   Pharyngeal- Thin Teaspoon  Swallow initiation at vallecula;Reduced tongue base retraction;Pharyngeal residue - valleculae;Pharyngeal residue - pyriform    trace vallecular and pyriform residuals clear with dry swallow   Pharyngeal- Thin Cup  Swallow initiation at vallecula;Reduced tongue base retraction;Pharyngeal residue - valleculae;Pharyngeal residue - pyriform    Pharyngeal- Thin Straw  Penetration/Aspiration during swallow;Swallow initiation at vallecula   Pt gagged on "taste"   Pharyngeal  Material does not enter airway;Material enters airway, remains ABOVE vocal cords then ejected out      Pharyngeal - Solids   Pharyngeal- Puree  Within functional limits;Swallow initiation at vallecula    Pharyngeal- Mechanical Soft  Within functional limits;Swallow initiation at vallecula    Pharyngeal- Regular  Within functional limits    Pharyngeal- Pill  Within functional limits      Pharyngeal Phase - Comment   Pharyngeal Comment  min reduced tongue base retraction with trace vallecular and pyriform residuals with liquids which clear with repeat/dry swallow      Electrical Stimulation - Pharyngeal Phase   Was Electrical Stimulation Used  No       Cricopharyngeal Phase - 05/22/20 1244      Cervical Esophageal Phase   Cervical Esophageal Phase  Within functional limits   min delayed emptying of barium in distal esohagus with some retrograde movement before clearance       SLP Short Term Goals - 05/22/20 1250      SLP SHORT TERM GOAL #1   Title  Pt will consume regular textures and thin liquids with use of strategies independently (swallow fast and hard, alternate with liquids).    Baseline  Pt is only consuming liquids at home right now    Time  4    Period  Weeks    Status  New    [place] Date  06/26/20      SLP SHORT TERM GOAL #2   Title  Pt will record daily intake of foods to include 3+ solid food meals per day on 5/7 days.    Baseline  Pt is not eating solids, does not have food diary yet    Time  4    Period  Weeks    Status  New    [place] Date  06/26/20      SLP SHORT TERM GOAL #3   Title  Pt will verbalize at least 3  swallowing strategies/precautions to aid in increased intake of solid foods    Baseline  Will review with Pt first session    Time  4    Period  Weeks    Status  New    [place]  Date  06/26/20       SLP Long Term Goals - 05/22/20 1259      SLP LONG TERM GOAL #1   Title  Pt will consume self regulated regular textures and thin liquids with use of strategies    Baseline  Pt is only drinking liquids/shakes and avoiding solids at this time    Time  4    Period  Weeks    Status  New    [place] Date  06/26/20       Plan - 05/22/20 1249    Clinical Impression Statement Oropharyngeal swallow is WFL, however she initially presented with prolonged oral phase with puree which she attributed to being fearful that she was going to have trouble swallowing it. Pt responded well to verbal encouragement and cues to "swallow fast and hard" and was able to swallow several bites of puree, regular textures, mechanical soft textures, and barium pill with thin liquids. Pt with mild (WNL) reduced tongue base retraction resulting in trace vallecular and pyriform residue post swallow which cleared with spontaneous repeat/dry swallow. Pt with flash penetration of straw sips thin liquid which only occurred after Pt gagged (due to taste per her report) on second sequential swallow and was immediately expelled. Esophageal sweep revealed delayed emptying in distal esophagus with some retrograde movement of barium, before eventually clearing. Pt verbalizes that she is fearful of eating and is motivated to address her perceived inability to swallow solids/pills. She endorses being under emotional stressors related to finances, health, dysphagia, and social issues. Recommend that Pt come in for outpatient dysphagia therapy to address her symptoms- will have Pt bring in meals and use "swallow fast and hard" techniques. Pt is in agreement with plan of care.    Speech Therapy Frequency  1x /week    Duration  4 weeks     Treatment/Interventions  Aspiration precaution training;SLP instruction and feedback;Compensatory strategies;Trials of upgraded texture/liquids;Patient/family education    Potential to Achieve Goals  Good    Potential Considerations  Other (comment)   social emotional stressors   SLP Home Exercise Plan  Pt will complete HEP as assinged to facilitate carryover of treatment strategies at home    Consulted and Agree with Plan of Care  Patient       Patient will benefit from skilled therapeutic intervention in order to improve the following deficits and impairments:   Dysphagia, oropharyngeal phase    Recommendations/Treatment - 05/22/20 1245      Swallow Evaluation Recommendations   SLP Diet Recommendations  Age appropriate regular;Thin    Liquid Administration via  Cup    Medication Administration  Whole meds with liquid    Supervision  Patient able to self feed    Compensations  Follow solids with liquid    Postural Changes  Seated upright at 90 degrees;Remain upright for at least 30 minutes after feeds/meals       Prognosis - 05/22/20 1246      Prognosis   Prognosis for Safe Diet Advancement  Good    Barriers to Reach Goals  Other (Comment)   social emotional stressors     Individuals Consulted   Consulted and Agree with Results and Recommendations  Patient    Report Sent to   Referring physician       Problem List Patient Active Problem List   Diagnosis Date Noted  . Dysphagia 05/11/2020  . GERD (gastroesophageal reflux disease) 05/11/2020  . Type 2 diabetes mellitus ([place]) 03/27/2020  . Bipolar  depression ([place]) 12/28/2019  . Sleep apnea 12/28/2019  . Acute lower UTI 10/31/2019  . Hypokalemia 10/31/2019  . Tachycardia 10/31/2019  . Urinary tract infection without hematuria   . Hyperlipidemia 10/30/2015  . Obesity due to excess calories 10/30/2015   Thank you,  Genene Churn, [place]  [facility] 05/22/2020, 1:02 PM  [facility] [address] [place], [place], [address] Phone: [phone]   Fax:  [phone]  Name: Sydney Taylor MRN: [id number] Date of Birth: Nov 02, 1966

## 2020-05-24 ENCOUNTER — Ambulatory Visit (INDEPENDENT_AMBULATORY_CARE_PROVIDER_SITE_OTHER): Payer: No Typology Code available for payment source | Admitting: Nurse Practitioner

## 2020-05-24 ENCOUNTER — Other Ambulatory Visit: Payer: Self-pay

## 2020-05-24 ENCOUNTER — Encounter (INDEPENDENT_AMBULATORY_CARE_PROVIDER_SITE_OTHER): Payer: Self-pay | Admitting: Nurse Practitioner

## 2020-05-24 ENCOUNTER — Telehealth: Payer: Self-pay | Admitting: *Deleted

## 2020-05-24 VITALS — BP 125/80 | HR 104 | Temp 97.2°F | Ht 70.0 in | Wt 243.2 lb

## 2020-05-24 DIAGNOSIS — W57XXXA Bitten or stung by nonvenomous insect and other nonvenomous arthropods, initial encounter: Secondary | ICD-10-CM

## 2020-05-24 DIAGNOSIS — S90861A Insect bite (nonvenomous), right foot, initial encounter: Secondary | ICD-10-CM | POA: Diagnosis not present

## 2020-05-24 MED ORDER — DOXYCYCLINE HYCLATE 100 MG PO TABS: 100.0000 mg | ORAL_TABLET | Freq: Two times a day (BID) | ORAL | 0 refills | Status: DC

## 2020-05-24 NOTE — Telephone Encounter (Signed)
[name], you are scheduled for a virtual visit with your provider today.  Just as we do with appointments in the office, we must obtain your consent to participate.  Your consent will be active for this visit and any virtual visit you may have with one of our providers in the next 365 days.  If you have a MyChart account, I can also send a copy of this consent to you electronically.  All virtual visits are billed to your insurance company just like a traditional visit in the office.  As this is a virtual visit, video technology does not allow for your provider to perform a traditional examination.  This may limit your provider's ability to fully assess your condition.  If your provider identifies any concerns that need to be evaluated in person or the need to arrange testing such as labs, EKG, etc, we will make arrangements to do so.  Although advances in technology are sophisticated, we cannot ensure that it will always work on either your end or our end.  If the connection with a video visit is poor, we may have to switch to a telephone visit.  With either a video or telephone visit, we are not always able to ensure that we have a secure connection.   I need to obtain your verbal consent now.   Are you willing to proceed with your visit today?

## 2020-05-24 NOTE — Patient Instructions (Signed)
Keep the area of the tick bite clean and dry.  You may apply your antibacterial ointment to the area twice a day.  Only use a bandage if you are going to be outside or wearing shoes that can irritate the area at the bite is located at.  Otherwise, you can keep the bite open to air.  If you experience signs of infection which include swelling, redness, heat, pain, purulent drainage, or red streaks up your leg please notify this office.  If you have any other questions do not hesitate to let us know.

## 2020-05-24 NOTE — Telephone Encounter (Signed)
Pt consented to a virtual visit on 05/11/20.

## 2020-05-24 NOTE — Progress Notes (Signed)
Subjective:  Patient ID: Sydney Taylor, female    DOB: 11-Mar-1966  Age: 54 y.o. MRN: [id number]  CC:  Chief Complaint  Patient presents with  . Tick Removal  . Nausea    x's 2 days       HPI  This patient arrives today for an acute visit for the above.  She tells me that she removed a tick from her right ankle approximately 3 days ago.  She is not sure what kind of tick it was, but she tells me it was quite small.  She has not experienced any fever or developed a rash.  She has been experiencing some nausea for the last couple of days.  She is concerned about infection to the site of the tick bite.    Past Medical History:  Diagnosis Date  . Anemia   . Anxiety   . Arrhythmia   . Bipolar depression ([place]) 12/28/2019  . Chronic headaches   . Depression   . Diabetes mellitus without complication ([place])   . Fibromyalgia   . GERD (gastroesophageal reflux disease)   . HLD (hyperlipidemia)   . Hypertension   . Insect bite   . Prediabetes   . Sleep apnea   . Urinary incontinence       Family History  Problem Relation Age of Onset  . Hypertension Father   . Hyperlipidemia Father   . Diabetes Father   . Hypertension Mother   . Hyperlipidemia Mother   . Diabetes Mellitus II Mother   . Congestive Heart Failure Mother   . Hypertension Sister   . Diabetes Sister   . Hypertension Brother   . Diabetes Maternal Grandmother   . Lupus Paternal Grandmother   . Colon cancer Neg Hx   . Esophageal cancer Neg Hx   . Gastric cancer Neg Hx     Social History   Social History Narrative   Married for 3 years.Lives with husband.Bachelors in Education officer, museum.   Social History   Tobacco Use  . Smoking status: Former Smoker    Types: Cigarettes    Quit date: 12/30/1982    Years since quitting: 37.4  . Smokeless tobacco: Never Used  Substance Use Topics  . Alcohol use: Not Currently    Alcohol/week: 0.0 standard drinks     Current Meds  Medication Sig  . amitriptyline  (ELAVIL) 25 MG tablet Take 25 mg by mouth at bedtime.   . chlorthalidone (HYGROTON) 50 MG tablet Take 0.5 tablets (25 mg total) by mouth daily.  . Cholecalciferol (VITAMIN D-3) 125 MCG (5000 UT) TABS Take 2 tablets by mouth daily.  . empagliflozin (JARDIANCE) 25 MG TABS tablet Take 25 mg by mouth daily before breakfast.  . esomeprazole (NEXIUM) 40 MG packet Take 40 mg by mouth daily before breakfast. Mix granules in 15 mL water, thicken for 2-3 mins, then drink  . Exenatide ER (BYDUREON BCISE) 2 MG/0.85ML AUIJ Inject 2 mg into the skin once a week.  . fluticasone (FLONASE) 50 MCG/ACT nasal spray Place 2 sprays into both nostrils daily.  Marland Kitchen glipiZIDE (GLUCOTROL) 10 MG tablet Take 1 tablet (10 mg total) by mouth 2 (two) times daily before a meal.  . lamoTRIgine (LAMICTAL) 100 MG tablet Take 100 mg by mouth 2 (two) times daily.   Marland Kitchen lidocaine (XYLOCAINE) 2 % solution Use as directed 15 mLs in the mouth or throat every 3 (three) hours as needed for mouth pain.  Marland Kitchen losartan (COZAAR) 25 MG tablet Take 25  mg by mouth daily.  . metoprolol tartrate (LOPRESSOR) 50 MG tablet Take 50 mg by mouth 2 (two) times daily.   . potassium chloride 20 MEQ/15ML (10%) SOLN Take 15 mLs (20 mEq total) by mouth 3 (three) times daily.  Marland Kitchen REXULTI 2 MG TABS tablet Take 2 mg by mouth at bedtime.     ROS:  Review of Systems  Constitutional: Negative for malaise/fatigue.  Gastrointestinal: Positive for nausea. Negative for vomiting.  Musculoskeletal: Negative for joint pain.  Skin: Negative for rash.     Objective:   Today's Vitals: BP 125/80 (BP Location: Left Arm, Patient Position: Sitting, Cuff Size: Normal)   Pulse (!) 104   Temp (!) 97.2 F (36.2 C) (Temporal)   Ht 5\' 10"  (1.778 m)   Wt 243 lb 3.2 oz (110.3 kg)   LMP  (LMP Unknown)   SpO2 94%   BMI 34.90 kg/m  Vitals with BMI 05/24/2020 05/12/2020 05/12/2020  Height 5\' 10"  - -  Weight 243 lbs 3 oz - -  BMI [id number] - -  Systolic 0000000 0000000 [id number]  Diastolic 80 77 81    Pulse 104 110 102     Physical Exam Vitals reviewed.  Constitutional:      General: She is not in acute distress.    Appearance: Normal appearance.  HENT:     Head: Normocephalic and atraumatic.  Neck:     Vascular: No carotid bruit.  Cardiovascular:     Rate and Rhythm: Normal rate and regular rhythm.     Pulses: Normal pulses.     Heart sounds: Normal heart sounds.  Pulmonary:     Effort: Pulmonary effort is normal.     Breath sounds: Normal breath sounds.  Musculoskeletal:       Feet:  Skin:    General: Skin is warm and dry.  Neurological:     General: No focal deficit present.     Mental Status: She is alert and oriented to person, place, and time.  Psychiatric:        Mood and Affect: Mood normal.        Behavior: Behavior normal.        Judgment: Judgment normal.          Assessment and Plan   1. Tick bite, initial encounter      Plan: 1.  I will prescribe her a course of doxycycline in case she was exposed to Lyme disease with a tick bite.  I recommend she take 1 tablet by mouth twice a day.  The site itself does not appear to be acutely infected, however we did discuss signs of infection and that she should call me if this were to occur.  Hopefully the doxycycline will also help prevent soft tissue infection at the site.  I also encouraged her to keep the area clean and dry.  I recommended that she apply antibiotic ointment which she has at home to the lesion twice a day.  She was encouraged to leave the lesion open to air when she is at rest at home to promote exposure to oxygen and healing of the skin.  I did warn her that doxycycline can cause GI upset including abdominal pain and diarrhea, also warned her of photosensitivity and recommended that she either stay out of the sun or wear long sleeves and use sunscreen while taking the medicine.  She tells me she understands.   Tests ordered No orders of the defined types were placed in this  encounter.     Meds ordered this encounter  Medications  . doxycycline (VIBRA-TABS) 100 MG tablet    Sig: Take 1 tablet (100 mg total) by mouth 2 (two) times daily.    Dispense:  20 tablet    Refill:  0    Order Specific Question:   Supervising Provider    Answer:   Doree Albee [id number]    Patient to follow-up as scheduled next month, or sooner.  Ailene Ards, NP

## 2020-05-25 ENCOUNTER — Ambulatory Visit (HOSPITAL_COMMUNITY): Payer: No Typology Code available for payment source | Admitting: Speech Pathology

## 2020-05-25 ENCOUNTER — Encounter (HOSPITAL_COMMUNITY): Payer: Self-pay | Admitting: Speech Pathology

## 2020-05-25 DIAGNOSIS — R1312 Dysphagia, oropharyngeal phase: Secondary | ICD-10-CM | POA: Diagnosis not present

## 2020-05-25 NOTE — Therapy (Addendum)
[facility] [place], [place], [address] Phone: [phone]   Fax:  [phone]  Speech Language Pathology Treatment  Patient Details  Name: Sydney Taylor MRN: [id number] Date of Birth: Jan 18, 1966 No data recorded  Encounter Date: 05/25/2020  End of Session - 05/25/20 1039    Visit Number  2    Number of Visits  4    Date for SLP Re-Evaluation  06/26/20    Authorization Type  [facility] EFF 05/08/2020 PLAN [id number]$2,500.00 DED/ 0 MET20% CO-INSURANCEOOP MAX $7,900.00/ $0 METNO AUTH REQ30 COMB VISIT LIMIT PT/OT/ST/ HARD LIMIT/ 0 USED   SLP Start Time  0945    SLP Stop Time   1030    SLP Time Calculation (min)  45 min    Activity Tolerance  Patient tolerated treatment well       Past Medical History:  Diagnosis Date  . Anemia   . Anxiety   . Arrhythmia   . Bipolar depression ([place]) 12/28/2019  . Chronic headaches   . Depression   . Diabetes mellitus without complication ([place])   . Fibromyalgia   . GERD (gastroesophageal reflux disease)   . HLD (hyperlipidemia)   . Hypertension   . Insect bite   . Prediabetes   . Sleep apnea   . Urinary incontinence     Past Surgical History:  Procedure Laterality Date  . ESOPHAGOGASTRODUODENOSCOPY N/A 05/12/2020   Procedure: ESOPHAGOGASTRODUODENOSCOPY (EGD);  Surgeon: Daneil Dolin, MD;  Location: [facility];  Service: Endoscopy;  Laterality: N/A;  11:15am  . Joint fusion on foot Right   . MALONEY DILATION N/A 05/12/2020   Procedure: Venia Minks DILATION;  Surgeon: Daneil Dolin, MD;  Location: [facility];  Service: Endoscopy;  Laterality: N/A;  . NASAL SEPTUM SURGERY      There were no vitals filed for this visit.  Subjective Assessment - 05/25/20 0956    Subjective  "I was able to eat some yogurt, cooked broccoli/carrots, and rice with varying success."    Currently in Pain?  No/denies       ADULT SLP TREATMENT - 05/25/20 0957      General Information    Behavior/Cognition  Alert;Cooperative;Pleasant mood    Patient Positioning  Upright in chair    Oral care provided  N/A    HPI  Sydney Taylor is a 54 yo female who was referred by Dr. Manus Rudd for MBSS due to Pt with reports if inability to swallow solid foods and pills which started sometime in April 2021. Pt has had numerous visits to the ED for similar complaints (dysphagiaJ). She tells me that she feels the onset was more acute than slow and began with a pill. She reports globus sensation with all solids and pills and is now only consuming liquids. She underwent EGD on 05/12/20 with dilation and reports initial improvement in symptoms, but then quickly dysphagia returned. Pt indicates that she does have social, emotional, and financial stressors at home. MBSS completed with recommendation for regular/thin.      Treatment Provided   Treatment provided  Dysphagia      Dysphagia Treatment   Temperature Spikes Noted  No    Respiratory Status  Room air    Oral Cavity - Dentition  Adequate natural dentition    Treatment Methods  Skilled observation;Patient/caregiver education;Compensation strategy training    Patient observed directly with PO's  Yes    Type of PO's observed  Regular;Thin  liquids    Feeding  Able to feed self    Liquids provided via  Cup    Oral Phase Signs & Symptoms  Prolonged mastication    Pharyngeal Phase Signs & Symptoms  Complaints of globus   with apple with skin   Type of cueing  Verbal    Amount of cueing  Minimal      Pain Assessment   Pain Assessment  No/denies pain      Assessment / Recommendations / Plan   Plan  Continue with current plan of care      Dysphagia Recommendations   Diet recommendations  Regular;Thin liquid    Liquids provided via  Cup    Medication Administration  Whole meds with puree    Supervision  Patient able to self feed    Compensations  Multiple dry swallows after each bite/sip;Follow solids with liquid;Effortful swallow     Postural Changes and/or Swallow Maneuvers  Seated upright 90 degrees;Upright 30-60 min after meal      Progression Toward Goals   Progression toward goals  Progressing toward goals       SLP Education - 05/25/20 1036    Education Details  Pt given food log and asked to complete, also given index card with reminders for chew well, swallow fast and hard, alternate solids/liquids as needed    Person(s) Educated  Patient    Methods  Explanation;Handout    Comprehension  Verbalized understanding       SLP Short Term Goals - 05/25/20 1040      SLP SHORT TERM GOAL #1   Title  Pt will consume regular textures and thin liquids with use of strategies independently (swallow fast and hard, alternate with liquids).    Baseline  Pt is only consuming liquids at home right now    Time  4    Period  Weeks    Status  On-going    Target Date  06/26/20      SLP SHORT TERM GOAL #2   Title  Pt will record daily intake of foods to include 3+ solid food meals per day on 5/7 days.    Baseline  Pt is not eating solids, does not have food diary yet    Time  4    Period  Weeks    Status  On-going    Target Date  06/26/20      SLP SHORT TERM GOAL #3   Title  Pt will verbalize at least 3 swallowing strategies/precautions to aid in increased intake of solid foods    Baseline  Will review with Pt first session    Time  4    Period  Weeks    Status  On-going    Target Date  06/26/20       SLP Long Term Goals - 05/25/20 1041      SLP LONG TERM GOAL #1   Title  Pt will consume self regulated regular textures and thin liquids with use of strategies    Baseline  Pt is only drinking liquids/shakes and avoiding solids at this time    Time  4    Period  Weeks    Status  On-going       Plan - 05/25/20 1039    Clinical Impression Statement Pt seen for dysphagia intervention following her MBSS completed on Monday. Pt reports that she is eating more by mouth (cooked carrots and broccoli, rice, and yogurt)  and had good success. Imaging from  her MBSS was reviewed with Pt and she recorded imaging to have on her phone to provide assurance of pharyngeal clearance.  SLP provided verbal and written strategies for Pt which include: masticating solids well, swallow fast and hard, alternate solids/liquids as needed, and repeat/dry swallow as needed. In session, Pt consumed taco chip, toast, apple with skin, applesauce, peanut butter crackers, and water via cup sips. Pt without overt signs of reduced airway protection, however she did report globus sensation following several bites of apple with peel. She was encouraged to drink sips of water and bites of puree to see if symptoms could be alleviated. She indicated a slight improvement. When asked if this made her fearful or if it was just an annoyance, she stated that it was more of an annoyance. SLP provided Pt with a food log and asked her to complete daily until our next session. Pt is making good progress toward feeling more comfortable eating solid foods. She will try to take her Nexium whole in a small amount of puree.    Speech Therapy Frequency  1x /week    Duration  4 weeks    Treatment/Interventions  Aspiration precaution training;SLP instruction and feedback;Compensatory strategies;Trials of upgraded texture/liquids;Patient/family education    Potential to Achieve Goals  Good    Potential Considerations  Other (comment)   social emotional stressors   SLP Home Exercise Plan  Pt will complete HEP as assinged to facilitate carryover of treatment strategies at home    Consulted and Agree with Plan of Care  Patient       Patient will benefit from skilled therapeutic intervention in order to improve the following deficits and impairments:   Dysphagia, oropharyngeal phase    Problem List Patient Active Problem List   Diagnosis Date Noted  . Dysphagia 05/11/2020  . GERD (gastroesophageal reflux disease) 05/11/2020  . Type 2 diabetes mellitus ([place])  03/27/2020  . Bipolar depression ([place]) 12/28/2019  . Sleep apnea 12/28/2019  . Acute lower UTI 10/31/2019  . Hypokalemia 10/31/2019  . Tachycardia 10/31/2019  . Urinary tract infection without hematuria   . Hyperlipidemia 10/30/2015  . Obesity due to excess calories 10/30/2015    SPEECH THERAPY DISCHARGE SUMMARY  Visits from Start of Care: 2  Current functional level related to goals / functional outcomes: Good progress toward goals   Remaining deficits: See above, Pt did not return for additional visits   Education / Equipment: See above  Plan: Patient agrees to discharge.  Patient goals were partially met. Patient is being discharged due to not returning since the last visit.  ?????        Thank you,  Genene Churn, [place]  [facility] 05/25/2020, 10:42 AM  [place], [place], [address] Phone: [phone]   Fax:  [phone]   Name: Sydney Taylor MRN: [id number] Date of Birth: 07/31/1966

## 2020-05-26 ENCOUNTER — Ambulatory Visit: Payer: No Typology Code available for payment source | Admitting: Gastroenterology

## 2020-05-31 ENCOUNTER — Ambulatory Visit (HOSPITAL_COMMUNITY): Payer: No Typology Code available for payment source | Admitting: Speech Pathology

## 2020-06-03 ENCOUNTER — Other Ambulatory Visit: Payer: Self-pay

## 2020-06-03 ENCOUNTER — Emergency Department (HOSPITAL_COMMUNITY)
Admission: EM | Admit: 2020-06-03 | Discharge: 2020-06-03 | Disposition: A | Discharge status: Home / Self Care | Payer: No Typology Code available for payment source | Attending: Emergency Medicine | Admitting: Emergency Medicine

## 2020-06-03 ENCOUNTER — Encounter (HOSPITAL_COMMUNITY): Payer: Self-pay | Admitting: Emergency Medicine

## 2020-06-03 DIAGNOSIS — Z87891 Personal history of nicotine dependence: Secondary | ICD-10-CM | POA: Insufficient documentation

## 2020-06-03 DIAGNOSIS — R509 Fever, unspecified: Secondary | ICD-10-CM | POA: Insufficient documentation

## 2020-06-03 DIAGNOSIS — Z7984 Long term (current) use of oral hypoglycemic drugs: Secondary | ICD-10-CM | POA: Diagnosis not present

## 2020-06-03 DIAGNOSIS — I10 Essential (primary) hypertension: Secondary | ICD-10-CM | POA: Insufficient documentation

## 2020-06-03 DIAGNOSIS — R531 Weakness: Secondary | ICD-10-CM | POA: Insufficient documentation

## 2020-06-03 DIAGNOSIS — Z79899 Other long term (current) drug therapy: Secondary | ICD-10-CM | POA: Insufficient documentation

## 2020-06-03 DIAGNOSIS — Z20822 Contact with and (suspected) exposure to covid-19: Secondary | ICD-10-CM | POA: Insufficient documentation

## 2020-06-03 DIAGNOSIS — E119 Type 2 diabetes mellitus without complications: Secondary | ICD-10-CM | POA: Insufficient documentation

## 2020-06-03 DIAGNOSIS — R52 Pain, unspecified: Secondary | ICD-10-CM | POA: Diagnosis not present

## 2020-06-03 LAB — CBC WITH DIFFERENTIAL/PLATELET
Abs Immature Granulocytes: 0.04 10*3/uL (ref 0.00–0.07)
Basophils Absolute: 0 10*3/uL (ref 0.0–0.1)
Basophils Relative: 0 %
Eosinophils Absolute: 0.2 10*3/uL (ref 0.0–0.5)
Eosinophils Relative: 2 %
HCT: 45.4 % (ref 36.0–46.0)
Hemoglobin: 15.2 g/dL — ABNORMAL HIGH (ref 12.0–15.0)
Immature Granulocytes: 0 %
Lymphocytes Relative: 19 %
Lymphs Abs: 1.9 10*3/uL (ref 0.7–4.0)
MCH: 31.5 pg (ref 26.0–34.0)
MCHC: 33.5 g/dL (ref 30.0–36.0)
MCV: 94 fL (ref 80.0–100.0)
Monocytes Absolute: 0.9 10*3/uL (ref 0.1–1.0)
Monocytes Relative: 9 %
Neutro Abs: 7.2 10*3/uL (ref 1.7–7.7)
Neutrophils Relative %: 70 %
Platelets: 315 10*3/uL (ref 150–400)
RBC: 4.83 MIL/uL (ref 3.87–5.11)
RDW: 13.2 % (ref 11.5–15.5)
WBC: 10.3 10*3/uL (ref 4.0–10.5)
nRBC: 0 % (ref 0.0–0.2)

## 2020-06-03 LAB — URINALYSIS, ROUTINE W REFLEX MICROSCOPIC
Bilirubin Urine: NEGATIVE
Glucose, UA: NEGATIVE mg/dL
Hgb urine dipstick: NEGATIVE
Ketones, ur: NEGATIVE mg/dL
Leukocytes,Ua: NEGATIVE
Nitrite: NEGATIVE
Protein, ur: NEGATIVE mg/dL
Specific Gravity, Urine: 1.01 (ref 1.005–1.030)
pH: 7 (ref 5.0–8.0)

## 2020-06-03 LAB — COMPREHENSIVE METABOLIC PANEL
ALT: 28 U/L (ref 0–44)
AST: 24 U/L (ref 15–41)
Albumin: 3.7 g/dL (ref 3.5–5.0)
Alkaline Phosphatase: 70 U/L (ref 38–126)
Anion gap: 12 (ref 5–15)
BUN: 14 mg/dL (ref 6–20)
CO2: 28 mmol/L (ref 22–32)
Calcium: 9.1 mg/dL (ref 8.9–10.3)
Chloride: 98 mmol/L (ref 98–111)
Creatinine, Ser: 1.03 mg/dL — ABNORMAL HIGH (ref 0.44–1.00)
GFR calc Af Amer: >60 mL/min (ref >60)
GFR calc non Af Amer: >60 mL/min (ref >60)
Glucose, Bld: 124 mg/dL — ABNORMAL HIGH (ref 70–99)
Potassium: 3.2 mmol/L — ABNORMAL LOW (ref 3.5–5.1)
Sodium: 138 mmol/L (ref 135–145)
Total Bilirubin: 0.7 mg/dL (ref 0.3–1.2)
Total Protein: 6.6 g/dL (ref 6.5–8.1)

## 2020-06-03 LAB — SARS CORONAVIRUS 2 BY RT PCR (HOSPITAL ORDER, PERFORMED IN ~~LOC~~ HOSPITAL LAB): SARS Coronavirus 2: NEGATIVE

## 2020-06-03 MED ORDER — SODIUM CHLORIDE 0.9 % IV BOLUS
1000.0000 mL | Freq: Once | INTRAVENOUS | Status: AC
  Administered 2020-06-03: 1000 mL via INTRAVENOUS

## 2020-06-03 NOTE — ED Notes (Signed)
Pt reports she has spoken to her provider in hte last week and states she was told nothing   Pt in NAD

## 2020-06-03 NOTE — Discharge Instructions (Addendum)
Continue taking your doxycycline and follow-up with your doctor next week

## 2020-06-03 NOTE — ED Triage Notes (Signed)
PT c/o generalized body aches, nausea and decreased appetite x1 week and denies any fever. PT states she removed a tick about 2 weeks ago.

## 2020-06-03 NOTE — ED Provider Notes (Signed)
[facility] EMERGENCY DEPARTMENT Provider Note   CSN: [id number] Arrival date & time: 06/03/20  1542     History Chief Complaint  Patient presents with  . Generalized Body Aches    Sydney Taylor is a 54 y.o. female.  Patient complains of fever and aches.  She has been placed on doxycycline for tick exposure  The history is provided by the patient and medical records. No language interpreter was used.  Weakness Severity:  Moderate Onset quality:  Sudden Timing:  Intermittent Progression:  Waxing and waning Chronicity:  Recurrent Context: not alcohol use   Relieved by:  Nothing Worsened by:  Nothing Ineffective treatments:  None tried Associated symptoms: fever   Associated symptoms: no abdominal pain, no chest pain, no cough, no diarrhea, no frequency, no headaches and no seizures        Past Medical History:  Diagnosis Date  . Anemia   . Anxiety   . Arrhythmia   . Bipolar depression ([place]) 12/28/2019  . Chronic headaches   . Depression   . Diabetes mellitus without complication ([place])   . Fibromyalgia   . GERD (gastroesophageal reflux disease)   . HLD (hyperlipidemia)   . Hypertension   . Insect bite   . Prediabetes   . Sleep apnea   . Urinary incontinence     Patient Active Problem List   Diagnosis Date Noted  . Dysphagia 05/11/2020  . GERD (gastroesophageal reflux disease) 05/11/2020  . Type 2 diabetes mellitus ([place]) 03/27/2020  . Bipolar depression ([place]) 12/28/2019  . Sleep apnea 12/28/2019  . Acute lower UTI 10/31/2019  . Hypokalemia 10/31/2019  . Tachycardia 10/31/2019  . Urinary tract infection without hematuria   . Hyperlipidemia 10/30/2015  . Obesity due to excess calories 10/30/2015    Past Surgical History:  Procedure Laterality Date  . ESOPHAGOGASTRODUODENOSCOPY N/A 05/12/2020   Procedure: ESOPHAGOGASTRODUODENOSCOPY (EGD);  Surgeon: Daneil Dolin, MD;  [facility];  Service: Endoscopy;  Laterality: N/A;  11:15am  . Joint  fusion on foot Right   . MALONEY DILATION N/A 05/12/2020   Procedure: Venia Minks DILATION;  Surgeon: Daneil Dolin, MD;  Location: [facility];  Service: Endoscopy;  Laterality: N/A;  . NASAL SEPTUM SURGERY       OB History    Gravida      Para      Term      Preterm      AB      Living  0     SAB      TAB      Ectopic      Multiple      Live Births              Family History  Problem Relation Age of Onset  . Hypertension Father   . Hyperlipidemia Father   . Diabetes Father   . Hypertension Mother   . Hyperlipidemia Mother   . Diabetes Mellitus II Mother   . Congestive Heart Failure Mother   . Hypertension Sister   . Diabetes Sister   . Hypertension Brother   . Diabetes Maternal Grandmother   . Lupus Paternal Grandmother   . Colon cancer Neg Hx   . Esophageal cancer Neg Hx   . Gastric cancer Neg Hx     Social History   Tobacco Use  . Smoking status: Former Smoker    Types: Cigarettes    Quit date: 12/30/1982    Years since quitting: 37.4  .  Smokeless tobacco: Never Used  Substance Use Topics  . Alcohol use: Not Currently    Alcohol/week: 0.0 standard drinks  . Drug use: No    Home Medications Prior to Admission medications   Medication Sig Start Date End Date Taking? Authorizing Provider  amitriptyline (ELAVIL) 25 MG tablet Take 25 mg by mouth at bedtime.  10/06/19   [provider]  chlorthalidone (HYGROTON) 50 MG tablet Take 0.5 tablets (25 mg total) by mouth daily. 05/03/20   Ailene Ards, NP  Cholecalciferol (VITAMIN D-3) 125 MCG (5000 UT) TABS Take 2 tablets by mouth daily.    [provider]  doxycycline (VIBRA-TABS) 100 MG tablet Take 1 tablet (100 mg total) by mouth 2 (two) times daily. 05/24/20   Ailene Ards, NP  empagliflozin (JARDIANCE) 25 MG TABS tablet Take 25 mg by mouth daily before breakfast. 05/10/20   Ailene Ards, NP  esomeprazole (NEXIUM) 40 MG packet Take 40 mg by mouth daily before breakfast. Mix  granules in 15 mL water, thicken for 2-3 mins, then drink 05/17/20   Carlis Stable, NP  Exenatide ER (BYDUREON BCISE) 2 MG/0.85ML AUIJ Inject 2 mg into the skin once a week. 05/10/20   Ailene Ards, NP  fluticasone Asencion Islam) 50 MCG/ACT nasal spray Place 2 sprays into both nostrils daily. 04/28/20   Wurst, [place], PA-C  glipiZIDE (GLUCOTROL) 10 MG tablet Take 1 tablet (10 mg total) by mouth 2 (two) times daily before a meal. 03/27/20 06/25/20  Ailene Ards, NP  lamoTRIgine (LAMICTAL) 100 MG tablet Take 100 mg by mouth 2 (two) times daily.     [provider]  lidocaine (XYLOCAINE) 2 % solution Use as directed 15 mLs in the mouth or throat every 3 (three) hours as needed for mouth pain. 05/10/20   Mesner, Corene Cornea, MD  losartan (COZAAR) 25 MG tablet Take 25 mg by mouth daily.    [provider]  metoprolol tartrate (LOPRESSOR) 50 MG tablet Take 50 mg by mouth 2 (two) times daily.     [provider]  potassium chloride 20 MEQ/15ML (10%) SOLN Take 15 mLs (20 mEq total) by mouth 3 (three) times daily. 05/10/20   Ailene Ards, NP  REXULTI 2 MG TABS tablet Take 2 mg by mouth at bedtime.  10/05/19   [provider]  promethazine (PHENERGAN) 25 MG tablet Take 1 tablet (25 mg total) by mouth every 12 (twelve) hours as needed for nausea or vomiting. 03/17/20 03/17/20  Wurst, [place], PA-C    Allergies    Keflex [cephalexin]  Review of Systems   Review of Systems  Constitutional: Positive for fatigue and fever. Negative for appetite change.  HENT: Negative for congestion, ear discharge and sinus pressure.   Eyes: Negative for discharge.  Respiratory: Negative for cough.   Cardiovascular: Negative for chest pain.  Gastrointestinal: Negative for abdominal pain and diarrhea.  Genitourinary: Negative for frequency and hematuria.  Musculoskeletal: Negative for back pain.  Skin: Negative for rash.  Neurological: Positive for weakness. Negative for seizures and headaches.    Psychiatric/Behavioral: Negative for hallucinations.    Physical Exam Updated Vital Signs BP 129/62 (BP Location: Right Arm)   Pulse 79   Temp 98.3 F (36.8 C) (Oral)   Resp 18   Ht 5\' 10"  (1.778 m)   Wt 108.9 kg   LMP  (LMP Unknown)   SpO2 95%   BMI 34.44 kg/m   Physical Exam Vitals and nursing note reviewed.  Constitutional:  Appearance: She is well-developed.  HENT:     Head: Normocephalic.     Nose: Nose normal.  Eyes:     General: No scleral icterus.    Conjunctiva/sclera: Conjunctivae normal.  Neck:     Thyroid: No thyromegaly.  Cardiovascular:     Rate and Rhythm: Normal rate and regular rhythm.     Heart sounds: No murmur. No friction rub. No gallop.   Pulmonary:     Breath sounds: No stridor. No wheezing or rales.  Chest:     Chest wall: No tenderness.  Abdominal:     General: There is no distension.     Tenderness: There is no abdominal tenderness. There is no rebound.  Musculoskeletal:        General: Normal range of motion.     Cervical back: Neck supple.  Lymphadenopathy:     Cervical: No cervical adenopathy.  Skin:    Findings: No erythema or rash.  Neurological:     Mental Status: She is alert and oriented to person, place, and time.     Motor: No abnormal muscle tone.     Coordination: Coordination normal.  Psychiatric:        Behavior: Behavior normal.     ED Results / Procedures / Treatments   Labs (all labs ordered are listed, but only abnormal results are displayed) Labs Reviewed  CBC WITH DIFFERENTIAL/PLATELET - Abnormal; Notable for the following components:      Result Value   Hemoglobin 15.2 (*)    All other components within normal limits  COMPREHENSIVE METABOLIC PANEL - Abnormal; Notable for the following components:   Potassium 3.2 (*)    Glucose, Bld 124 (*)    Creatinine, Ser 1.03 (*)    All other components within normal limits  SARS CORONAVIRUS 2 BY RT PCR (HOSPITAL ORDER, [place] [facility])   URINALYSIS, ROUTINE W REFLEX MICROSCOPIC  ROCKY MTN SPOTTED FVR ABS PNL(IGG+IGM)  LYME DISEASE DNA BY PCR([facility])    EKG None  Radiology No results found.  Procedures Procedures (including critical care time)  Medications Ordered in ED Medications  sodium chloride 0.9 % bolus 1,000 mL (0 mLs Intravenous Stopped 06/03/20 1902)    ED Course  I have reviewed the triage vital signs and the nursing notes.  Pertinent labs & imaging results that were available during my care of the patient were reviewed by me and considered in my medical decision making (see chart for details).    MDM Rules/Calculators/A&P                      Labs unremarkable.  Patient had a [place] spotted fever and Lyme start titer done.  Patient will continue doxycycline and follow-up with her PCP        This patient presents to the ED for concern of urinary this involves an extensive number of treatment options, and is a complaint that carries with it a high risk of complications and morbidity.  The differential diagnosis includes infection   Lab Tests:   I Ordered, reviewed, and interpreted labs, which included CBC chemistries [facility] spotted fever and Lyme's disease.  Patient has mild hypokalemia  Medicines ordered:   I ordered medication normal saline for dehydration  Imaging Studies ordered:    Additional history obtained:   Additional history obtained from records  Previous records obtained and reviewed  Consultations Obtained:     Reevaluation:  After the interventions stated above,  I reevaluated the patient and found mild improvement  Critical Interventions:  .   final Clinical Impression(s) / ED Diagnoses Final diagnoses:  Body aches    Rx / DC Orders ED Discharge Orders    None       Milton Ferguson, MD 06/05/20 1108

## 2020-06-03 NOTE — ED Notes (Signed)
RMSF as well as Lyme are send outs   They usually return in 3-5 days per lab

## 2020-06-03 NOTE — ED Notes (Signed)
Pt fluids in   Asked if she feels better   She replies, "not really"  Labs returned

## 2020-06-03 NOTE — ED Notes (Signed)
Seen the end of May for a tick removal  Reports body aches and N this week   No rash no fever

## 2020-06-06 LAB — ROCKY MTN SPOTTED FVR ABS PNL(IGG+IGM)
RMSF IgG: NEGATIVE
RMSF IgM: 0.38 index (ref 0.00–0.89)

## 2020-06-08 ENCOUNTER — Telehealth (INDEPENDENT_AMBULATORY_CARE_PROVIDER_SITE_OTHER): Payer: No Typology Code available for payment source | Admitting: Internal Medicine

## 2020-06-08 ENCOUNTER — Other Ambulatory Visit: Payer: Self-pay

## 2020-06-08 ENCOUNTER — Encounter (INDEPENDENT_AMBULATORY_CARE_PROVIDER_SITE_OTHER): Payer: Self-pay | Admitting: Internal Medicine

## 2020-06-08 DIAGNOSIS — R110 Nausea: Secondary | ICD-10-CM | POA: Diagnosis not present

## 2020-06-08 DIAGNOSIS — R52 Pain, unspecified: Secondary | ICD-10-CM

## 2020-06-08 MED ORDER — ONDANSETRON HCL 4 MG PO TABS: 4.0000 mg | ORAL_TABLET | Freq: Three times a day (TID) | ORAL | 0 refills | Status: DC | PRN

## 2020-06-08 NOTE — Progress Notes (Addendum)
Metrics: Intervention Frequency ACO  Documented Smoking Status Yearly  Screened one or more times in 24 months  Cessation Counseling or  Active cessation medication Past 24 months  Past 24 months   Guideline developer: UpToDate (See UpToDate for funding source) Date Released: 2014       Wellness Office Visit  Subjective:  Patient ID: Daneka Lantigua, female    DOB: 02/07/1966  Age: 54 y.o. MRN: [id number]  CC: This was supposed to be an audiovisual telemedicine visit with the patient but she had technical difficulties and was not able to do a video visit so we had a audio visit today.  She gave me permission for this visit.  The patient was in her home and I am in my office.  Her chief complaints are nausea, body aches, low-grade fever. HPI  She has had the symptoms for the last 10 days.  She went to the emergency room 5 days ago and a Covid test was negative.  White blood cell count was not elevated.  Potassium was low and she was slightly dehydrated.  [facility] spotted fever test was negative.  Urinalysis was also negative. She has nausea but no vomiting.  She does not have any abdominal pain.  She does not have any dyspnea.  She has had 1 dose of the COVID-19 vaccine almost a month ago.  Her husband is fully vaccinated, having had the second dose approximately 2 weeks ago. Past Medical History:  Diagnosis Date  . Anemia   . Anxiety   . Arrhythmia   . Bipolar depression ([place]) 12/28/2019  . Chronic headaches   . Depression   . Diabetes mellitus without complication ([place])   . Fibromyalgia   . GERD (gastroesophageal reflux disease)   . HLD (hyperlipidemia)   . Hypertension   . Insect bite   . Prediabetes   . Sleep apnea   . Urinary incontinence    Past Surgical History:  Procedure Laterality Date  . ESOPHAGOGASTRODUODENOSCOPY N/A 05/12/2020   Procedure: ESOPHAGOGASTRODUODENOSCOPY (EGD);  Surgeon: Daneil Dolin, MD;  Location[facility];  Service: Endoscopy;   Laterality: N/A;  11:15am  . Joint fusion on foot Right   . MALONEY DILATION N/A 05/12/2020   Procedure: Venia Minks DILATION;  Surgeon: Daneil Dolin, MD;  Location[facility];  Service: Endoscopy;  Laterality: N/A;  . NASAL SEPTUM SURGERY       Family History  Problem Relation Age of Onset  . Hypertension Father   . Hyperlipidemia Father   . Diabetes Father   . Hypertension Mother   . Hyperlipidemia Mother   . Diabetes Mellitus II Mother   . Congestive Heart Failure Mother   . Hypertension Sister   . Diabetes Sister   . Hypertension Brother   . Diabetes Maternal Grandmother   . Lupus Paternal Grandmother   . Colon cancer Neg Hx   . Esophageal cancer Neg Hx   . Gastric cancer Neg Hx     Social History   Social History Narrative   Married for 3 years.Lives with husband.Bachelors in Education officer, museum.   Social History   Tobacco Use  . Smoking status: Former Smoker    Types: Cigarettes    Quit date: 12/30/1982    Years since quitting: 37.4  . Smokeless tobacco: Never Used  Substance Use Topics  . Alcohol use: Not Currently    Alcohol/week: 0.0 standard drinks    Current Meds  Medication Sig  . amitriptyline (ELAVIL) 25 MG tablet Take  25 mg by mouth at bedtime.   . chlorthalidone (HYGROTON) 50 MG tablet Take 0.5 tablets (25 mg total) by mouth daily.  . Cholecalciferol (VITAMIN D-3) 125 MCG (5000 UT) TABS Take 2 tablets by mouth daily.  Marland Kitchen doxycycline (VIBRA-TABS) 100 MG tablet Take 1 tablet (100 mg total) by mouth 2 (two) times daily.  . empagliflozin (JARDIANCE) 25 MG TABS tablet Take 25 mg by mouth daily before breakfast.  . esomeprazole (NEXIUM) 40 MG packet Take 40 mg by mouth daily before breakfast. Mix granules in 15 mL water, thicken for 2-3 mins, then drink  . Exenatide ER (BYDUREON BCISE) 2 MG/0.85ML AUIJ Inject 2 mg into the skin once a week.  . fluticasone (FLONASE) 50 MCG/ACT nasal spray Place 2 sprays into both nostrils daily.  Marland Kitchen glipiZIDE (GLUCOTROL) 10 MG tablet  Take 1 tablet (10 mg total) by mouth 2 (two) times daily before a meal.  . lamoTRIgine (LAMICTAL) 100 MG tablet Take 100 mg by mouth 2 (two) times daily.   Marland Kitchen lidocaine (XYLOCAINE) 2 % solution Use as directed 15 mLs in the mouth or throat every 3 (three) hours as needed for mouth pain.  Marland Kitchen losartan (COZAAR) 25 MG tablet Take 25 mg by mouth daily.  . metoprolol tartrate (LOPRESSOR) 50 MG tablet Take 50 mg by mouth 2 (two) times daily.   . potassium chloride 20 MEQ/15ML (10%) SOLN Take 15 mLs (20 mEq total) by mouth 3 (three) times daily.  Marland Kitchen REXULTI 2 MG TABS tablet Take 2 mg by mouth at bedtime.        Depression screen [facility] 2/9 04/06/2020 03/27/2020  Decreased Interest 0 0  Down, Depressed, Hopeless 0 0  PHQ - 2 Score 0 0     Objective:   Today's Vitals: LMP  (LMP Unknown)  Vitals with BMI 06/08/2020 06/03/2020 06/03/2020  Height (No Data) - -  Weight (No Data) - -  BMI - - -  Systolic (No Data) - 993  Diastolic (No Data) - 77  Pulse - 80 -     Physical Exam   She appears to be alert and orientated on the phone and her speech is normal.    Assessment   1. Nausea   2. Body aches       Tests ordered No orders of the defined types were placed in this encounter.    Plan: 1. I have told her that despite her negative Covid test, I think  it is possible that she still has COVID-19 disease.  I have sent a prescription for Zofran for symptomatic relief.  I have told her that if she starts to get vomiting or abdominal pain or feels worse in any way, she must go back to the emergency room. 2. This phone call lasted 10 minutes.    Meds ordered this encounter  Medications  . ondansetron (ZOFRAN) 4 MG tablet    Sig: Take 1 tablet (4 mg total) by mouth every 8 (eight) hours as needed for nausea or vomiting.    Dispense:  20 tablet    Refill:  0    Lin Glazier Luther Parody, MD

## 2020-06-09 ENCOUNTER — Other Ambulatory Visit (INDEPENDENT_AMBULATORY_CARE_PROVIDER_SITE_OTHER): Payer: Self-pay | Admitting: Internal Medicine

## 2020-06-09 DIAGNOSIS — R2243 Localized swelling, mass and lump, lower limb, bilateral: Secondary | ICD-10-CM

## 2020-06-12 ENCOUNTER — Ambulatory Visit (HOSPITAL_COMMUNITY): Payer: No Typology Code available for payment source | Admitting: Speech Pathology

## 2020-06-12 ENCOUNTER — Telehealth (HOSPITAL_COMMUNITY): Payer: Self-pay | Admitting: Speech Pathology

## 2020-06-12 NOTE — Telephone Encounter (Signed)
pt called to cx today's appt due to she is not feeling well 

## 2020-06-18 ENCOUNTER — Other Ambulatory Visit (INDEPENDENT_AMBULATORY_CARE_PROVIDER_SITE_OTHER): Payer: Self-pay | Admitting: Nurse Practitioner

## 2020-06-18 DIAGNOSIS — E1100 Type 2 diabetes mellitus with hyperosmolarity without nonketotic hyperglycemic-hyperosmolar coma (NKHHC): Secondary | ICD-10-CM

## 2020-06-19 ENCOUNTER — Other Ambulatory Visit: Payer: Self-pay

## 2020-06-19 ENCOUNTER — Ambulatory Visit (INDEPENDENT_AMBULATORY_CARE_PROVIDER_SITE_OTHER): Payer: No Typology Code available for payment source | Admitting: Nurse Practitioner

## 2020-06-19 ENCOUNTER — Encounter (INDEPENDENT_AMBULATORY_CARE_PROVIDER_SITE_OTHER): Payer: Self-pay | Admitting: Nurse Practitioner

## 2020-06-19 ENCOUNTER — Telehealth (INDEPENDENT_AMBULATORY_CARE_PROVIDER_SITE_OTHER): Payer: Self-pay | Admitting: Nurse Practitioner

## 2020-06-19 VITALS — BP 115/80 | HR 84 | Temp 97.7°F | Ht 70.0 in | Wt 241.6 lb

## 2020-06-19 DIAGNOSIS — N3090 Cystitis, unspecified without hematuria: Secondary | ICD-10-CM

## 2020-06-19 DIAGNOSIS — R2243 Localized swelling, mass and lump, lower limb, bilateral: Secondary | ICD-10-CM

## 2020-06-19 DIAGNOSIS — E1165 Type 2 diabetes mellitus with hyperglycemia: Secondary | ICD-10-CM | POA: Diagnosis not present

## 2020-06-19 DIAGNOSIS — R1084 Generalized abdominal pain: Secondary | ICD-10-CM | POA: Diagnosis not present

## 2020-06-19 MED ORDER — CHLORTHALIDONE 25 MG PO TABS: 25.0000 mg | ORAL_TABLET | Freq: Every day | ORAL | Status: DC

## 2020-06-19 MED ORDER — ESTRADIOL 0.1 MG/GM VA CREA: 1.0000 | TOPICAL_CREAM | VAGINAL | 12 refills | Status: DC

## 2020-06-19 NOTE — Telephone Encounter (Signed)
I left her a detailed voicemail of instructions

## 2020-06-19 NOTE — Progress Notes (Addendum)
Subjective:  Patient ID: Sydney Taylor, female    DOB: 1966-11-25  Age: 54 y.o. MRN: [id number]  CC:  Chief Complaint  Patient presents with  . Diabetes  . Follow-up  . Abdominal Pain  . Other    Recurrent cystitis      HPI  This patient arrives today for the above.  Diabetes: She has a history of type diabetes and currently is on glipizide only.  She has not been able tolerate a form in the past.  She was taking Bydureon injections, but was experiencing injection site irritation so stopped taking this.  She never started the Jardiance due to difficulties with dysphagia.  Her dysphagia is much improved at this time after undergoing speech therapy to help her with swallowing as well as esophageal stretching.  She tells me that her fasting blood sugars are in the 110s at home.  She denies any hypoglycemic events.  Abdominal pain: She been having generalized abdominal pain intermittently for the past month.  Approximately 6 weeks ago she did undergo upper endoscopy with GI as part of her work-up for her dysphagia.  At that time gastritis was noted without ulcer, and esophageal stricture was also noted.  She does continue on pantoprazole 40 mg twice a day.  She denies any fever, blood in her stool, or vomiting.  She does experience some nausea.  She will sometimes experience loose stools, but usually has 1 bowel movement per day or 1 bowel movement every other day.  She tells me eating seems to elicit her symptoms.  The pain waxes and wanes and will get up to 7/10 in intensity.  The pain will eventually subside on its own.  She is concerned that she has been experiencing quite a bit of anxiety and depression lately.  She was given evaluated in the behavioral health hospital approximately 1 week ago, at which time her medications for her mood were adjusted.  She is wondering if this could be affecting her abdominal symptoms.  Recurrent cystitis: She mentions that she is been getting  recurrent UTIs especially sexual intercourse and is wearing what she can do to prevent these.   Past Medical History:  Diagnosis Date  . Anemia   . Anxiety   . Arrhythmia   . Bipolar depression ([place]) 12/28/2019  . Chronic headaches   . Depression   . Diabetes mellitus without complication ([place])   . Fibromyalgia   . GERD (gastroesophageal reflux disease)   . HLD (hyperlipidemia)   . Hypertension   . Insect bite   . Prediabetes   . Sleep apnea   . Urinary incontinence       Family History  Problem Relation Age of Onset  . Hypertension Father   . Hyperlipidemia Father   . Diabetes Father   . Hypertension Mother   . Hyperlipidemia Mother   . Diabetes Mellitus II Mother   . Congestive Heart Failure Mother   . Hypertension Sister   . Diabetes Sister   . Hypertension Brother   . Diabetes Maternal Grandmother   . Lupus Paternal Grandmother   . Colon cancer Neg Hx   . Esophageal cancer Neg Hx   . Gastric cancer Neg Hx     Social History   Social History Narrative   Married for 3 years.Lives with husband.Bachelors in Education officer, museum.   Social History   Tobacco Use  . Smoking status: Former Smoker    Types: Cigarettes    Quit date: 12/30/1982  Years since quitting: 37.4  . Smokeless tobacco: Never Used  Substance Use Topics  . Alcohol use: Not Currently    Alcohol/week: 0.0 standard drinks     Current Meds  Medication Sig  . ALPRAZolam (XANAX) 0.25 MG tablet Take 0.25 mg by mouth 2 (two) times daily as needed for anxiety.  . busPIRone (BUSPAR) 5 MG tablet Take 5 mg by mouth 3 (three) times daily.  . chlorthalidone (HYGROTON) 25 MG tablet Take 1 tablet (25 mg total) by mouth daily.  . Cholecalciferol (VITAMIN D-3) 125 MCG (5000 UT) TABS Take 2 tablets by mouth daily.  Marland Kitchen glipiZIDE (GLUCOTROL) 10 MG tablet TAKE 1 TABLET (10 MG TOTAL) BY MOUTH 2 (TWO) TIMES DAILY BEFORE A MEAL.  Marland Kitchen lidocaine (XYLOCAINE) 2 % solution Use as directed 15 mLs in the mouth or throat every 3  (three) hours as needed for mouth pain.  Marland Kitchen losartan (COZAAR) 25 MG tablet Take 25 mg by mouth daily.  Marland Kitchen lurasidone (LATUDA) 40 MG TABS tablet Take 40 mg by mouth daily with breakfast.  . metoprolol tartrate (LOPRESSOR) 50 MG tablet Take 50 mg by mouth 2 (two) times daily.   . pantoprazole (PROTONIX) 40 MG tablet Take 40 mg by mouth in the morning and at bedtime.  . potassium chloride 20 MEQ/15ML (10%) SOLN Take 15 mLs (20 mEq total) by mouth 3 (three) times daily.  . [DISCONTINUED] amitriptyline (ELAVIL) 25 MG tablet Take 25 mg by mouth at bedtime.   . [DISCONTINUED] chlorthalidone (HYGROTON) 50 MG tablet TAKE 1 TABLET BY MOUTH EVERY DAY (Patient taking differently: Take 25 mg by mouth daily. )  . [DISCONTINUED] doxycycline (VIBRA-TABS) 100 MG tablet Take 1 tablet (100 mg total) by mouth 2 (two) times daily.  . [DISCONTINUED] empagliflozin (JARDIANCE) 25 MG TABS tablet Take 25 mg by mouth daily before breakfast.  . [DISCONTINUED] esomeprazole (NEXIUM) 40 MG packet Take 40 mg by mouth daily before breakfast. Mix granules in 15 mL water, thicken for 2-3 mins, then drink  . [DISCONTINUED] Exenatide ER (BYDUREON BCISE) 2 MG/0.85ML AUIJ Inject 2 mg into the skin once a week.  . [DISCONTINUED] fluticasone (FLONASE) 50 MCG/ACT nasal spray Place 2 sprays into both nostrils daily.  . [DISCONTINUED] lamoTRIgine (LAMICTAL) 100 MG tablet Take 100 mg by mouth 2 (two) times daily.   . [DISCONTINUED] ondansetron (ZOFRAN) 4 MG tablet Take 1 tablet (4 mg total) by mouth every 8 (eight) hours as needed for nausea or vomiting.  . [DISCONTINUED] REXULTI 2 MG TABS tablet Take 2 mg by mouth at bedtime.     ROS:  Review of Systems  Constitutional: Negative for fever.  Eyes: Negative for blurred vision.  Respiratory: Negative for shortness of breath.   Cardiovascular: Negative for chest pain.  Gastrointestinal: Positive for abdominal pain, diarrhea, heartburn and nausea. Negative for blood in stool, constipation and  vomiting.  Neurological: Negative for dizziness and headaches.     Objective:   Today's Vitals: BP 115/80 (BP Location: Left Arm, Patient Position: Sitting, Cuff Size: Normal)   Pulse 84   Temp 97.7 F (36.5 C) (Temporal)   Ht 5\' 10"  (1.778 m)   Wt 241 lb 9.6 oz (109.6 kg)   LMP  (LMP Unknown)   SpO2 97%   BMI 34.67 kg/m  Vitals with BMI 06/19/2020 06/08/2020 06/03/2020  Height 5\' 10"  (No Data) -  Weight 241 lbs 10 oz (No Data) -  BMI 53.61 - -  Systolic 443 (No Data) -  Diastolic 80 (No  Data) -  Pulse 84 - 80     Physical Exam Vitals reviewed.  Constitutional:      General: She is not in acute distress.    Appearance: Normal appearance.  HENT:     Head: Normocephalic and atraumatic.  Neck:     Vascular: No carotid bruit.  Cardiovascular:     Rate and Rhythm: Normal rate and regular rhythm.     Pulses: Normal pulses.     Heart sounds: Normal heart sounds.  Pulmonary:     Effort: Pulmonary effort is normal.     Breath sounds: Normal breath sounds.  Abdominal:     General: Abdomen is flat. Bowel sounds are decreased. There is no distension or abdominal bruit.     Palpations: Abdomen is soft. There is no hepatomegaly, splenomegaly or mass.     Tenderness: There is abdominal tenderness in the right upper quadrant and left upper quadrant. There is no guarding. Negative signs include Murphy's sign.     Hernia: No hernia is present.  Skin:    General: Skin is warm and dry.  Neurological:     General: No focal deficit present.     Mental Status: She is alert and oriented to person, place, and time.  Psychiatric:        Mood and Affect: Mood normal.        Behavior: Behavior normal.        Judgment: Judgment normal.          Assessment and Plan   1. Generalized abdominal pain   2. Localized swelling, mass, or lump of lower extremity, bilateral   3. Recurrent cystitis   4. Type 2 diabetes mellitus with hyperglycemia, without long-term current use of insulin  ([place])      Plan: 1.  I do not note any red flag symptoms, and exam was not worrisome.  At this point per shared decision making we will hold off on additional testing and imaging for approximate 1 month.  If her symptoms persist I would recommend ultrasound of the abdomen for further evaluation.  I did tell her about red flag symptoms including vomiting, bloody or dark tarry stools, unintentional weight loss, or progression symptoms.  If these do occur, I asked her to call the office at which point we can pursue further work-up with imaging at that time.  She tells me she understands.  2.  Made adjustments to dose of chlorthalidone on her medical record active medication list during review of medications.  3.  We will try topical estrogen cream, she is encouraged to let me know if this does not improve her symptoms. She was also instructed to void after sexual intercourse to help prevent cystitis.  4.  She will continue on her current medication regimen for now.  We will consider getting A1c at next office visit.   Tests ordered No orders of the defined types were placed in this encounter.     Meds ordered this encounter  Medications  . chlorthalidone (HYGROTON) 25 MG tablet    Sig: Take 1 tablet (25 mg total) by mouth daily.    Order Specific Question:   Supervising Provider    Answer:   Doree Albee [[id number]]    Patient to follow-up in 1 month or sooner as needed  Ailene Ards, NP

## 2020-06-19 NOTE — Progress Notes (Signed)
Done

## 2020-06-19 NOTE — Telephone Encounter (Signed)
Please call this patient and let her know that the vaginal cream should be administered daily for 2 weeks, then only administered 3 times per week thereafter. I apologize I thought it was to be administered daily, but after 2 weeks it is only administered a few days per week. Make sure she knows that if her symptoms persist she can call the office for further instruction. Thank you.

## 2020-06-27 ENCOUNTER — Other Ambulatory Visit (INDEPENDENT_AMBULATORY_CARE_PROVIDER_SITE_OTHER): Payer: Self-pay

## 2020-06-27 DIAGNOSIS — N3090 Cystitis, unspecified without hematuria: Secondary | ICD-10-CM

## 2020-06-27 MED ORDER — ESTRADIOL 0.1 MG/GM VA CREA: 1.0000 | TOPICAL_CREAM | VAGINAL | 3 refills | Status: DC

## 2020-07-06 ENCOUNTER — Ambulatory Visit (INDEPENDENT_AMBULATORY_CARE_PROVIDER_SITE_OTHER): Payer: No Typology Code available for payment source | Admitting: Nurse Practitioner

## 2020-07-10 ENCOUNTER — Telehealth (INDEPENDENT_AMBULATORY_CARE_PROVIDER_SITE_OTHER): Payer: Self-pay

## 2020-07-10 DIAGNOSIS — R1084 Generalized abdominal pain: Secondary | ICD-10-CM

## 2020-07-10 NOTE — Telephone Encounter (Signed)
Sydney Taylor Is calling stating that she would like to go forward with the abdominal ultrasound , please advise?

## 2020-07-10 NOTE — Telephone Encounter (Signed)
I have placed the order for ultrasound please help her get this set up. Thank you.

## 2020-07-10 NOTE — Telephone Encounter (Signed)
Sydney Taylor is scheduled on Monday July 19th at 8:30 am and she is aware

## 2020-07-11 ENCOUNTER — Encounter (HOSPITAL_COMMUNITY): Payer: Self-pay | Admitting: Emergency Medicine

## 2020-07-11 ENCOUNTER — Emergency Department (HOSPITAL_COMMUNITY)
Admission: EM | Admit: 2020-07-11 | Discharge: 2020-07-12 | Disposition: A | Discharge status: Other Acute Inpatient Hospital | Payer: No Typology Code available for payment source | Source: Home / Self Care | Attending: Emergency Medicine | Admitting: Emergency Medicine

## 2020-07-11 ENCOUNTER — Other Ambulatory Visit: Payer: Self-pay

## 2020-07-11 DIAGNOSIS — Z20822 Contact with and (suspected) exposure to covid-19: Secondary | ICD-10-CM | POA: Insufficient documentation

## 2020-07-11 DIAGNOSIS — I10 Essential (primary) hypertension: Secondary | ICD-10-CM | POA: Insufficient documentation

## 2020-07-11 DIAGNOSIS — R45851 Suicidal ideations: Secondary | ICD-10-CM | POA: Insufficient documentation

## 2020-07-11 DIAGNOSIS — F314 Bipolar disorder, current episode depressed, severe, without psychotic features: Secondary | ICD-10-CM | POA: Insufficient documentation

## 2020-07-11 DIAGNOSIS — E876 Hypokalemia: Secondary | ICD-10-CM

## 2020-07-11 DIAGNOSIS — Z7984 Long term (current) use of oral hypoglycemic drugs: Secondary | ICD-10-CM | POA: Insufficient documentation

## 2020-07-11 DIAGNOSIS — Z79899 Other long term (current) drug therapy: Secondary | ICD-10-CM | POA: Insufficient documentation

## 2020-07-11 DIAGNOSIS — R440 Auditory hallucinations: Secondary | ICD-10-CM

## 2020-07-11 DIAGNOSIS — E119 Type 2 diabetes mellitus without complications: Secondary | ICD-10-CM | POA: Insufficient documentation

## 2020-07-11 LAB — RAPID URINE DRUG SCREEN, HOSP PERFORMED
Amphetamines: NOT DETECTED
Barbiturates: NOT DETECTED
Benzodiazepines: NOT DETECTED
Cocaine: NOT DETECTED
Opiates: POSITIVE — AB
Tetrahydrocannabinol: NOT DETECTED

## 2020-07-11 LAB — COMPREHENSIVE METABOLIC PANEL
ALT: 28 U/L (ref 0–44)
AST: 22 U/L (ref 15–41)
Albumin: 3.8 g/dL (ref 3.5–5.0)
Alkaline Phosphatase: 57 U/L (ref 38–126)
Anion gap: 11 (ref 5–15)
BUN: 9 mg/dL (ref 6–20)
CO2: 24 mmol/L (ref 22–32)
Calcium: 8.7 mg/dL — ABNORMAL LOW (ref 8.9–10.3)
Chloride: 95 mmol/L — ABNORMAL LOW (ref 98–111)
Creatinine, Ser: 0.9 mg/dL (ref 0.44–1.00)
GFR calc Af Amer: >60 mL/min (ref >60)
GFR calc non Af Amer: >60 mL/min (ref >60)
Glucose, Bld: 172 mg/dL — ABNORMAL HIGH (ref 70–99)
Potassium: 2.3 mmol/L — CL (ref 3.5–5.1)
Sodium: 130 mmol/L — ABNORMAL LOW (ref 135–145)
Total Bilirubin: 0.7 mg/dL (ref 0.3–1.2)
Total Protein: 6.5 g/dL (ref 6.5–8.1)

## 2020-07-11 LAB — CBC
HCT: 41.2 % (ref 36.0–46.0)
Hemoglobin: 13.8 g/dL (ref 12.0–15.0)
MCH: 31.2 pg (ref 26.0–34.0)
MCHC: 33.5 g/dL (ref 30.0–36.0)
MCV: 93.2 fL (ref 80.0–100.0)
Platelets: 320 10*3/uL (ref 150–400)
RBC: 4.42 MIL/uL (ref 3.87–5.11)
RDW: 12.3 % (ref 11.5–15.5)
WBC: 11 10*3/uL — ABNORMAL HIGH (ref 4.0–10.5)
nRBC: 0 % (ref 0.0–0.2)

## 2020-07-11 LAB — ACETAMINOPHEN LEVEL: Acetaminophen (Tylenol), Serum: <10 ug/mL — ABNORMAL LOW (ref 10–30)

## 2020-07-11 LAB — SALICYLATE LEVEL: Salicylate Lvl: <7 mg/dL — ABNORMAL LOW (ref 7.0–30.0)

## 2020-07-11 LAB — ETHANOL: Alcohol, Ethyl (B): <10 mg/dL (ref <10)

## 2020-07-11 LAB — SARS CORONAVIRUS 2 BY RT PCR (HOSPITAL ORDER, PERFORMED IN ~~LOC~~ HOSPITAL LAB): SARS Coronavirus 2: NEGATIVE

## 2020-07-11 MED ORDER — POTASSIUM CHLORIDE CRYS ER 20 MEQ PO TBCR
40.0000 meq | EXTENDED_RELEASE_TABLET | Freq: Once | ORAL | Status: DC
  Filled 2020-07-11: qty 2

## 2020-07-11 MED ORDER — SODIUM CHLORIDE 0.9 % IV BOLUS
1000.0000 mL | Freq: Once | INTRAVENOUS | Status: AC
  Administered 2020-07-11: 1000 mL via INTRAVENOUS

## 2020-07-11 MED ORDER — POTASSIUM CHLORIDE 20 MEQ PO PACK
40.0000 meq | PACK | Freq: Once | ORAL | Status: AC
  Administered 2020-07-11: 40 meq via ORAL
  Filled 2020-07-11: qty 2

## 2020-07-11 MED ORDER — POTASSIUM CHLORIDE 10 MEQ/100ML IV SOLN
10.0000 meq | Freq: Once | INTRAVENOUS | Status: AC
  Administered 2020-07-11: 10 meq via INTRAVENOUS
  Filled 2020-07-11: qty 100

## 2020-07-11 MED ORDER — TRAZODONE HCL 50 MG PO TABS
50.0000 mg | ORAL_TABLET | Freq: Every day | ORAL | Status: DC
  Administered 2020-07-11: 50 mg via ORAL
  Filled 2020-07-11: qty 1

## 2020-07-11 MED ORDER — POTASSIUM CHLORIDE 10 MEQ/100ML IV SOLN
10.0000 meq | Freq: Once | INTRAVENOUS | Status: AC
  Administered 2020-07-12: 10 meq via INTRAVENOUS
  Filled 2020-07-11: qty 100

## 2020-07-11 NOTE — BHH Counselor (Signed)
Per Maudie Mercury, RN/AC at [facility] patient's Potassium and Sodium levels need to be addressed prior to admission to [facility]. Once patient medically cleared patient to be reviewed for admission.

## 2020-07-11 NOTE — ED Notes (Signed)
CRITICAL VALUE ALERT  Critical Value:  K+ 2.3  Date & Time Notied:  07/11/2020 2038  Provider Notified: Dr. Stark Jock  Orders Received/Actions taken: see chart

## 2020-07-11 NOTE — BH Assessment (Signed)
Comprehensive Clinical Assessment (CCA) Note  07/11/2020 Sydney Taylor [id number]   Patient is a 54 year old female presenting voluntarily to [facility] with a primary complaint of suicidal ideation. Patient reports for 1 month an increase in depression, anxiety, and "negative, intrusive thoughts I cant control telling me I should kill myself." She cites marital conflict as primary stressors. Patient endorses SI with thoughts of overdosing. Today she saw her therapist, Janett Billow at [facility], who recommended she come to the ED for evaluation. She denies HI/AVH. Patient states she was hospitalized in [place] approximately 1 month ago for SI but discharged after 2 days because hospital was out of network. She denies any substance use or trauma history. Patient reports she started taking Latuda, Trazedone, Buspar, and Xanax PRN 1 month ago but she has not seen a change in her mood. She sees a PM[facility] at [facility]. Patient fives verbal consent for TTS to contact her husband, Herbie Baltimore, at [phone], for collateral information if necessary.  Jeanette Caprice, NP recommends in patient treatment.  Visit Diagnosis:  Bipolar I current episode depressed, severe  CCA Biopsychosocial  Intake/Chief Complaint:  CCA Intake With Chief Complaint CCA Part Two Date: 07/11/20 Chief Complaint/Presenting Problem: suicidal Patient's Currently Reported Symptoms/Problems: NA Individual's Strengths: NA Individual's Preferences: NA Individual's Abilities: NA Type of Services Patient Feels Are Needed: NA Initial Clinical Notes/Concerns: NA  Mental Health Symptoms Depression:  Depression: Change in energy/activity, Difficulty Concentrating, Fatigue, Hopelessness, Increase/decrease in appetite, Irritability, Tearfulness, Weight gain/loss, Worthlessness, Duration of symptoms greater than two weeks  Mania:  Mania: None  Anxiety:   Anxiety: Difficulty concentrating, Irritability, Worrying, Tension   Psychosis:  Psychosis: None  Trauma:  Trauma: None  Obsessions:  Obsessions: None  Compulsions:  Compulsions: None  Inattention:  Inattention: None  Hyperactivity/Impulsivity:  Hyperactivity/Impulsivity: N/A  Oppositional/Defiant Behaviors:  Oppositional/Defiant Behaviors: None  Emotional Irregularity:  Emotional Irregularity: N/A  Other Mood/Personality Symptoms:      Mental Status Exam Appearance and self-care  Stature:  Stature: Average  Weight:  Weight: Overweight  Clothing:  Clothing: Neat/clean  Grooming:  Grooming: Normal  Cosmetic use:  Cosmetic Use: None  Posture/gait:  Posture/Gait: Normal  Motor activity:  Motor Activity: Not Remarkable  Sensorium  Attention:  Attention: Normal  Concentration:  Concentration: Normal  Orientation:  Orientation: X5  Recall/memory:  Recall/Memory: Normal  Affect and Mood  Affect:  Affect: Flat  Mood:  Mood: Depressed  Relating  Eye contact:  Eye Contact: Normal  Facial expression:  Facial Expression: Depressed  Attitude toward examiner:  Attitude Toward Examiner: Guarded  Thought and Language  Speech flow: Speech Flow: Clear and Coherent  Thought content:  Thought Content: Appropriate to Mood and Circumstances  Preoccupation:  Preoccupations: None  Hallucinations:  Hallucinations: None  Organization:     Transport planner of Knowledge:  Fund of Knowledge: Average  Intelligence:  Intelligence: Average  Abstraction:  Abstraction: Abstract  Judgement:  Judgement: Impaired  Reality Testing:  Reality Testing: Realistic  Insight:  Insight: Fair  Decision Making:  Decision Making: Impulsive  Social Functioning  Social Maturity:  Social Maturity: Responsible  Social Judgement:  Social Judgement: Normal  Stress  Stressors:  Stressors: Family conflict, Relationship  Coping Ability:  Coping Ability: Research officer, political party Deficits:  Skill Deficits: None  Supports:  Supports: Friends/Service system      Religion: Religion/Spirituality Are You A Religious Person?:  (not assessed) How Might This Affect Treatment?: NA  Leisure/Recreation: Leisure / Recreation Do  You Have Hobbies?: No  Exercise/Diet: Exercise/Diet Do You Exercise?: No Have You Gained or Lost A Significant Amount of Weight in the Past Six Months?: Yes-Lost Number of Pounds Lost?: 30 Do You Follow a Special Diet?: No Do You Have Any Trouble Sleeping?: No   CCA Employment/Education  Employment/Work Situation: Employment / Work Situation Employment situation: Product manager job has been impacted by current illness: No What is the longest time patient has a held a job?: NA Where was the patient employed at that time?: NA Has patient ever been in the [employer]?: No  Education: Education Is Patient Currently Attending School?: No Last Grade Completed: 12 Did Teacher, adult education From [employer]?: Yes Did Physicist, medical?: No Did Heritage manager?: No Did You Have An Individualized Education Program (IIEP): No Did You Have Any Difficulty At [employer]?: No Patient's Education Has Been Impacted by Current Illness: No   CCA Family/Childhood History  Family and Relationship History: Family history Marital status: Married Number of Years Married:  (not assessed) What types of issues is patient dealing with in the relationship?: reports recent relationship conflict Are you sexually active?: No What is your sexual orientation?: heterosexual Has your sexual activity been affected by drugs, alcohol, medication, or emotional stress?: NA Does patient have children?: No  Childhood History:  Childhood History By whom was/is the patient raised?: Both parents Additional childhood history information: none Description of patient's relationship with caregiver when they were a child: NA Patient's description of current relationship with people who raised him/her: deceased How were you disciplined when you  got in trouble as a child/adolescent?: NA Does patient have siblings?: No Did patient suffer any verbal/emotional/physical/sexual abuse as a child?: No Did patient suffer from severe childhood neglect?: No Has patient ever been sexually abused/assaulted/raped as an adolescent or adult?: No Was the patient ever a victim of a crime or a disaster?: No Witnessed domestic violence?: No Has patient been affected by domestic violence as an adult?: No  Child/Adolescent Assessment:     CCA Substance Use  Alcohol/Drug Use: Alcohol / Drug Use Pain Medications: see MAR Prescriptions: see MAR Over the Counter: see MAR History of alcohol / drug use?: No history of alcohol / drug abuse                         ASAM's:  Six Dimensions of Multidimensional Assessment  Dimension 1:  Acute Intoxication and/or Withdrawal Potential:      Dimension 2:  Biomedical Conditions and Complications:      Dimension 3:  Emotional, Behavioral, or Cognitive Conditions and Complications:     Dimension 4:  Readiness to Change:     Dimension 5:  Relapse, Continued use, or Continued Problem Potential:     Dimension 6:  Recovery/Living Environment:     ASAM Severity Score:    ASAM Recommended Level of Treatment:     Substance use Disorder (SUD)    Recommendations for Services/Supports/Treatments:    DSM5 Diagnoses: Patient Active Problem List   Diagnosis Date Noted  . Dysphagia 05/11/2020  . GERD (gastroesophageal reflux disease) 05/11/2020  . Type 2 diabetes mellitus ([place]) 03/27/2020  . Bipolar depression ([place]) 12/28/2019  . Sleep apnea 12/28/2019  . Acute lower UTI 10/31/2019  . Hypokalemia 10/31/2019  . Tachycardia 10/31/2019  . Urinary tract infection without hematuria   . Hyperlipidemia 10/30/2015  . Obesity due to excess calories 10/30/2015    Patient Centered Plan: Patient is on the  following Treatment Plan(s):    Referrals to Alternative Service(s): Referred to Alternative  Service(s):   Place:   Date:   Time:    Referred to Alternative Service(s):   Place:   Date:   Time:    Referred to Alternative Service(s):   Place:   Date:   Time:    Referred to Alternative Service(s):   Place:   Date:   Time:     Orvis Brill

## 2020-07-11 NOTE — ED Provider Notes (Addendum)
[facility] EMERGENCY DEPARTMENT Provider Note   CSN: [id number] Arrival date & time: 07/11/20  1808     History Chief Complaint  Patient presents with  . V70.1    Sydney Taylor is a 54 y.o. female.  Patient is a 55 year old female with history of bipolar, depression, anxiety, fibromyalgia, hypertension.  She presents today for evaluation of suicidal ideation.  Patient states she has been having issues with her anxiety and depression worsening for the past several weeks.  She reports what she describes as "obtrusive thoughts" entering her mind.  She hears voices calling her "a stupid bitch".  Patient reports being compliant with her psychiatric medications.  She denies drug or alcohol use.  The history is provided by the patient.       Past Medical History:  Diagnosis Date  . Anemia   . Anxiety   . Arrhythmia   . Bipolar depression ([place]) 12/28/2019  . Chronic headaches   . Depression   . Diabetes mellitus without complication ([place])   . Fibromyalgia   . GERD (gastroesophageal reflux disease)   . HLD (hyperlipidemia)   . Hypertension   . Insect bite   . Prediabetes   . Sleep apnea   . Urinary incontinence     Patient Active Problem List   Diagnosis Date Noted  . Dysphagia 05/11/2020  . GERD (gastroesophageal reflux disease) 05/11/2020  . Type 2 diabetes mellitus ([place]) 03/27/2020  . Bipolar depression ([place]) 12/28/2019  . Sleep apnea 12/28/2019  . Acute lower UTI 10/31/2019  . Hypokalemia 10/31/2019  . Tachycardia 10/31/2019  . Urinary tract infection without hematuria   . Hyperlipidemia 10/30/2015  . Obesity due to excess calories 10/30/2015    Past Surgical History:  Procedure Laterality Date  . ESOPHAGOGASTRODUODENOSCOPY N/A 05/12/2020   Procedure: ESOPHAGOGASTRODUODENOSCOPY (EGD);  Surgeon: Daneil Dolin, MD;  Location: [facility];  Service: Endoscopy;  Laterality: N/A;  11:15am  . Joint fusion on foot Right   . MALONEY DILATION N/A 05/12/2020    Procedure: Venia Minks DILATION;  Surgeon: Daneil Dolin, MD;  Location: [facility] ENDO SUITE;  Service: Endoscopy;  Laterality: N/A;  . NASAL SEPTUM SURGERY       OB History    Gravida      Para      Term      Preterm      AB      Living  0     SAB      TAB      Ectopic      Multiple      Live Births              Family History  Problem Relation Age of Onset  . Hypertension Father   . Hyperlipidemia Father   . Diabetes Father   . Hypertension Mother   . Hyperlipidemia Mother   . Diabetes Mellitus II Mother   . Congestive Heart Failure Mother   . Hypertension Sister   . Diabetes Sister   . Hypertension Brother   . Diabetes Maternal Grandmother   . Lupus Paternal Grandmother   . Colon cancer Neg Hx   . Esophageal cancer Neg Hx   . Gastric cancer Neg Hx     Social History   Tobacco Use  . Smoking status: Former Smoker    Types: Cigarettes    Quit date: 12/30/1982    Years since quitting: 37.5  . Smokeless tobacco: Never Used  Vaping Use  . Vaping Use: Never  used  Substance Use Topics  . Alcohol use: Not Currently    Alcohol/week: 0.0 standard drinks  . Drug use: No    Home Medications Prior to Admission medications   Medication Sig Start Date End Date Taking? Authorizing Provider  ALPRAZolam (XANAX) 0.25 MG tablet Take 0.25 mg by mouth 2 (two) times daily as needed for anxiety.    [provider]  busPIRone (BUSPAR) 5 MG tablet Take 5 mg by mouth 3 (three) times daily.    [provider]  chlorthalidone (HYGROTON) 25 MG tablet Take 1 tablet (25 mg total) by mouth daily. 06/19/20   Ailene Ards, NP  Cholecalciferol (VITAMIN D-3) 125 MCG (5000 UT) TABS Take 2 tablets by mouth daily.    [provider]  estradiol (ESTRACE) 0.1 MG/GM vaginal cream Place 1 Applicatorful vaginally 3 (three) times a week. Administer 1 applicator vaginally daily for 2 weeks, then administer 1 applicator vaginally 3 times per week. 06/28/20   Gosrani,  Nimish C, MD  glipiZIDE (GLUCOTROL) 10 MG tablet TAKE 1 TABLET (10 MG TOTAL) BY MOUTH 2 (TWO) TIMES DAILY BEFORE A MEAL. 06/19/20 09/17/20  Ailene Ards, NP  lidocaine (XYLOCAINE) 2 % solution Use as directed 15 mLs in the mouth or throat every 3 (three) hours as needed for mouth pain. 05/10/20   Mesner, Corene Cornea, MD  losartan (COZAAR) 25 MG tablet Take 25 mg by mouth daily.    [provider]  lurasidone (LATUDA) 40 MG TABS tablet Take 40 mg by mouth daily with breakfast.    [provider]  metoprolol tartrate (LOPRESSOR) 50 MG tablet Take 50 mg by mouth 2 (two) times daily.     [provider]  pantoprazole (PROTONIX) 40 MG tablet Take 40 mg by mouth in the morning and at bedtime.    [provider]  potassium chloride 20 MEQ/15ML (10%) SOLN Take 15 mLs (20 mEq total) by mouth 3 (three) times daily. 05/10/20   Ailene Ards, NP  chlorthalidone (HYGROTON) 50 MG tablet Take 0.5 tablets (25 mg total) by mouth daily. 05/03/20   Ailene Ards, NP  promethazine (PHENERGAN) 25 MG tablet Take 1 tablet (25 mg total) by mouth every 12 (twelve) hours as needed for nausea or vomiting. 03/17/20 03/17/20  Lestine Box, PA-C    Allergies    Keflex [cephalexin]  Review of Systems   Review of Systems  All other systems reviewed and are negative.   Physical Exam Updated Vital Signs BP 120/74   Pulse 89   Temp 99.1 F (37.3 C)   Resp 18   Ht 5\' 10"  (1.778 m)   Wt 108.9 kg   LMP  (LMP Unknown)   SpO2 98%   BMI 34.44 kg/m   Physical Exam Vitals and nursing note reviewed.  Constitutional:      General: She is not in acute distress.    Appearance: She is well-developed. She is not diaphoretic.  HENT:     Head: Normocephalic and atraumatic.  Cardiovascular:     Rate and Rhythm: Normal rate and regular rhythm.     Heart sounds: No murmur heard.  No friction rub. No gallop.   Pulmonary:     Effort: Pulmonary effort is normal. No respiratory distress.     Breath  sounds: Normal breath sounds. No wheezing.  Abdominal:     General: Bowel sounds are normal. There is no distension.     Palpations: Abdomen is soft.     Tenderness:  There is no abdominal tenderness.  Musculoskeletal:        General: Normal range of motion.     Cervical back: Normal range of motion and neck supple.  Skin:    General: Skin is warm and dry.  Neurological:     Mental Status: She is alert and oriented to person, place, and time.  Psychiatric:        Attention and Perception: Attention and perception normal.        Mood and Affect: Mood and affect normal.        Speech: Speech normal.        Behavior: Behavior normal.        Thought Content: Thought content includes suicidal ideation. Thought content does not include homicidal ideation. Thought content does not include homicidal or suicidal plan.        Cognition and Memory: Cognition normal.        Judgment: Judgment is impulsive and inappropriate.     ED Results / Procedures / Treatments   Labs (all labs ordered are listed, but only abnormal results are displayed) Labs Reviewed  COMPREHENSIVE METABOLIC PANEL - Abnormal; Notable for the following components:      Result Value   Sodium 130 (*)    Potassium 2.3 (*)    Chloride 95 (*)    Glucose, Bld 172 (*)    Calcium 8.7 (*)    All other components within normal limits  SALICYLATE LEVEL - Abnormal; Notable for the following components:   Salicylate Lvl <5.4 (*)    All other components within normal limits  ACETAMINOPHEN LEVEL - Abnormal; Notable for the following components:   Acetaminophen (Tylenol), Serum <10 (*)    All other components within normal limits  CBC - Abnormal; Notable for the following components:   WBC 11.0 (*)    All other components within normal limits  RAPID URINE DRUG SCREEN, HOSP PERFORMED - Abnormal; Notable for the following components:   Opiates POSITIVE (*)    All other components within normal limits  ETHANOL  POC URINE PREG, ED      EKG EKG Interpretation  Date/Time:  Tuesday July 11 2020 23:16:21 EDT Ventricular Rate:  81 PR Interval:    QRS Duration: 97 QT Interval:  397 QTC Calculation: 461 R Axis:   86 Text Interpretation: Sinus rhythm Low voltage, precordial leads No significant change since 03/17/2020 Confirmed by Veryl Speak [phone]) on 07/11/2020 11:24:24 PM   Radiology No results found.  Procedures Procedures (including critical care time)  Medications Ordered in ED Medications  potassium chloride SA (KLOR-CON) CR tablet 40 mEq (has no administration in time range)    ED Course  I have reviewed the triage vital signs and the nursing notes.  Pertinent labs & imaging results that were available during my care of the patient were reviewed by me and considered in my medical decision making (see chart for details).    MDM Rules/Calculators/A&P  Patient presents with complaints of hearing voices and suicidal ideation.  TTS feels as though she meets inpatient criteria, however need her potassium and sodium levels to be addressed prior to acceptance.  Patient given normal saline along with oral and IV potassium.  Final Clinical Impression(s) / ED Diagnoses Final diagnoses:  None    Rx / DC Orders ED Discharge Orders    None       Veryl Speak, MD 07/11/20 2252    Veryl Speak, MD 07/11/20 2324

## 2020-07-11 NOTE — ED Triage Notes (Signed)
Pt reports SI and anxiety. Pt has plan to overdose on xanax. Pt also reports negative thoughts.

## 2020-07-12 ENCOUNTER — Encounter (HOSPITAL_COMMUNITY): Payer: Self-pay | Admitting: Psychiatry

## 2020-07-12 ENCOUNTER — Inpatient Hospital Stay (HOSPITAL_COMMUNITY)
Admission: AD | Admit: 2020-07-12 | Discharge: 2020-07-17 | DRG: 885 | Disposition: A | Discharge status: Home / Self Care | Payer: No Typology Code available for payment source | Source: Intra-hospital | Attending: Psychiatry | Admitting: Psychiatry

## 2020-07-12 DIAGNOSIS — R2243 Localized swelling, mass and lump, lower limb, bilateral: Secondary | ICD-10-CM

## 2020-07-12 DIAGNOSIS — F332 Major depressive disorder, recurrent severe without psychotic features: Secondary | ICD-10-CM | POA: Diagnosis not present

## 2020-07-12 DIAGNOSIS — R45851 Suicidal ideations: Secondary | ICD-10-CM | POA: Diagnosis not present

## 2020-07-12 DIAGNOSIS — Z20822 Contact with and (suspected) exposure to covid-19: Secondary | ICD-10-CM | POA: Diagnosis present

## 2020-07-12 DIAGNOSIS — I10 Essential (primary) hypertension: Secondary | ICD-10-CM | POA: Diagnosis present

## 2020-07-12 DIAGNOSIS — F322 Major depressive disorder, single episode, severe without psychotic features: Principal | ICD-10-CM | POA: Diagnosis present

## 2020-07-12 DIAGNOSIS — Z79899 Other long term (current) drug therapy: Secondary | ICD-10-CM

## 2020-07-12 DIAGNOSIS — K219 Gastro-esophageal reflux disease without esophagitis: Secondary | ICD-10-CM | POA: Diagnosis present

## 2020-07-12 DIAGNOSIS — F339 Major depressive disorder, recurrent, unspecified: Secondary | ICD-10-CM

## 2020-07-12 DIAGNOSIS — R451 Restlessness and agitation: Secondary | ICD-10-CM | POA: Diagnosis present

## 2020-07-12 DIAGNOSIS — Z87891 Personal history of nicotine dependence: Secondary | ICD-10-CM

## 2020-07-12 DIAGNOSIS — R42 Dizziness and giddiness: Secondary | ICD-10-CM | POA: Diagnosis present

## 2020-07-12 DIAGNOSIS — E1140 Type 2 diabetes mellitus with diabetic neuropathy, unspecified: Secondary | ICD-10-CM | POA: Diagnosis present

## 2020-07-12 DIAGNOSIS — G4700 Insomnia, unspecified: Secondary | ICD-10-CM | POA: Diagnosis present

## 2020-07-12 DIAGNOSIS — F329 Major depressive disorder, single episode, unspecified: Secondary | ICD-10-CM | POA: Diagnosis present

## 2020-07-12 LAB — POTASSIUM: Potassium: 3.5 mmol/L (ref 3.5–5.1)

## 2020-07-12 LAB — COMPREHENSIVE METABOLIC PANEL
ALT: 25 U/L (ref 0–44)
AST: 20 U/L (ref 15–41)
Albumin: 3.3 g/dL — ABNORMAL LOW (ref 3.5–5.0)
Alkaline Phosphatase: 50 U/L (ref 38–126)
Anion gap: 9 (ref 5–15)
BUN: 8 mg/dL (ref 6–20)
CO2: 24 mmol/L (ref 22–32)
Calcium: 7.9 mg/dL — ABNORMAL LOW (ref 8.9–10.3)
Chloride: 100 mmol/L (ref 98–111)
Creatinine, Ser: 0.85 mg/dL (ref 0.44–1.00)
GFR calc Af Amer: >60 mL/min (ref >60)
GFR calc non Af Amer: >60 mL/min (ref >60)
Glucose, Bld: 170 mg/dL — ABNORMAL HIGH (ref 70–99)
Potassium: 2.9 mmol/L — ABNORMAL LOW (ref 3.5–5.1)
Sodium: 133 mmol/L — ABNORMAL LOW (ref 135–145)
Total Bilirubin: 0.5 mg/dL (ref 0.3–1.2)
Total Protein: 5.6 g/dL — ABNORMAL LOW (ref 6.5–8.1)

## 2020-07-12 LAB — HEMOGLOBIN A1C
Hgb A1c MFr Bld: 6.4 % — ABNORMAL HIGH (ref 4.8–5.6)
Mean Plasma Glucose: 136.98 mg/dL

## 2020-07-12 LAB — CBG MONITORING, ED: Glucose-Capillary: 171 mg/dL — ABNORMAL HIGH (ref 70–99)

## 2020-07-12 MED ORDER — ALUM & MAG HYDROXIDE-SIMETH 200-200-20 MG/5ML PO SUSP: 30.0000 mL | ORAL | Status: DC | PRN

## 2020-07-12 MED ORDER — CHLORTHALIDONE 25 MG PO TABS
25.0000 mg | ORAL_TABLET | Freq: Every day | ORAL | Status: DC
  Administered 2020-07-12 – 2020-07-17 (×6): 25 mg via ORAL
  Filled 2020-07-12 (×8): qty 1

## 2020-07-12 MED ORDER — CHLORDIAZEPOXIDE HCL 25 MG PO CAPS: 25.0000 mg | ORAL_CAPSULE | Freq: Three times a day (TID) | ORAL | Status: DC | PRN

## 2020-07-12 MED ORDER — POTASSIUM CHLORIDE CRYS ER 20 MEQ PO TBCR: 40.0000 meq | EXTENDED_RELEASE_TABLET | Freq: Once | ORAL | Status: DC

## 2020-07-12 MED ORDER — EMPAGLIFLOZIN 25 MG PO TABS
25.0000 mg | ORAL_TABLET | Freq: Every day | ORAL | Status: DC
  Administered 2020-07-13: 25 mg via ORAL
  Filled 2020-07-12 (×2): qty 1

## 2020-07-12 MED ORDER — MAGNESIUM SULFATE 2 GM/50ML IV SOLN
2.0000 g | Freq: Once | INTRAVENOUS | Status: AC
  Administered 2020-07-12: 2 g via INTRAVENOUS
  Filled 2020-07-12: qty 50

## 2020-07-12 MED ORDER — ACETAMINOPHEN 325 MG PO TABS
650.0000 mg | ORAL_TABLET | Freq: Once | ORAL | Status: AC
  Administered 2020-07-12: 650 mg via ORAL
  Filled 2020-07-12: qty 2

## 2020-07-12 MED ORDER — TRAZODONE HCL 50 MG PO TABS
50.0000 mg | ORAL_TABLET | Freq: Every evening | ORAL | Status: DC | PRN
  Administered 2020-07-12 – 2020-07-15 (×4): 50 mg via ORAL
  Filled 2020-07-12 (×4): qty 1

## 2020-07-12 MED ORDER — PANTOPRAZOLE SODIUM 40 MG PO TBEC
40.0000 mg | DELAYED_RELEASE_TABLET | Freq: Every day | ORAL | Status: DC
  Administered 2020-07-12 – 2020-07-13 (×2): 40 mg via ORAL
  Filled 2020-07-12 (×4): qty 1

## 2020-07-12 MED ORDER — ONDANSETRON HCL 4 MG PO TABS
4.0000 mg | ORAL_TABLET | Freq: Three times a day (TID) | ORAL | Status: DC | PRN
  Administered 2020-07-13: 4 mg via ORAL
  Filled 2020-07-12: qty 1

## 2020-07-12 MED ORDER — INSULIN ASPART 100 UNIT/ML ~~LOC~~ SOLN
0.0000 [IU] | Freq: Three times a day (TID) | SUBCUTANEOUS | Status: DC
  Administered 2020-07-12 – 2020-07-13 (×2): 3 [IU] via SUBCUTANEOUS
  Administered 2020-07-13 – 2020-07-14 (×2): 2 [IU] via SUBCUTANEOUS
  Administered 2020-07-14: 5 [IU] via SUBCUTANEOUS
  Administered 2020-07-14: 3 [IU] via SUBCUTANEOUS
  Administered 2020-07-15: 2 [IU] via SUBCUTANEOUS
  Administered 2020-07-15 – 2020-07-16 (×3): 3 [IU] via SUBCUTANEOUS
  Administered 2020-07-16 – 2020-07-17 (×3): 2 [IU] via SUBCUTANEOUS
  Administered 2020-07-17: 3 [IU] via SUBCUTANEOUS

## 2020-07-12 MED ORDER — POTASSIUM CHLORIDE 10 MEQ/100ML IV SOLN
10.0000 meq | INTRAVENOUS | Status: AC
  Administered 2020-07-12 (×2): 10 meq via INTRAVENOUS
  Filled 2020-07-12 (×2): qty 100

## 2020-07-12 MED ORDER — MAGNESIUM HYDROXIDE 400 MG/5ML PO SUSP: 30.0000 mL | Freq: Every day | ORAL | Status: DC | PRN

## 2020-07-12 MED ORDER — ACETAMINOPHEN 325 MG PO TABS
650.0000 mg | ORAL_TABLET | Freq: Four times a day (QID) | ORAL | Status: DC | PRN
  Administered 2020-07-16 – 2020-07-17 (×2): 650 mg via ORAL
  Filled 2020-07-12 (×2): qty 2

## 2020-07-12 MED ORDER — INSULIN ASPART 100 UNIT/ML ~~LOC~~ SOLN
4.0000 [IU] | Freq: Three times a day (TID) | SUBCUTANEOUS | Status: DC
  Administered 2020-07-12 – 2020-07-13 (×2): 4 [IU] via SUBCUTANEOUS

## 2020-07-12 MED ORDER — POTASSIUM CHLORIDE 20 MEQ PO PACK
40.0000 meq | PACK | Freq: Once | ORAL | Status: AC
  Administered 2020-07-12: 40 meq via ORAL
  Filled 2020-07-12: qty 2

## 2020-07-12 MED ORDER — HYDROXYZINE HCL 25 MG PO TABS
25.0000 mg | ORAL_TABLET | Freq: Three times a day (TID) | ORAL | Status: DC | PRN
  Administered 2020-07-12 – 2020-07-16 (×4): 25 mg via ORAL
  Filled 2020-07-12 (×6): qty 1

## 2020-07-12 NOTE — Tx Team (Signed)
Initial Treatment Plan 07/12/2020 4:45 PM [name] LSL:[id number]    PATIENT STRESSORS: Financial difficulties Health problems Marital or family conflict Occupational concerns   PATIENT STRENGTHS: Ability for insight Average or above average intelligence Capable of independent living Motivation for treatment/growth Physical Health Supportive family/friends   PATIENT IDENTIFIED PROBLEMS: anxiety  depression  Suicidal ideations  Financial strain  Relationship strain             DISCHARGE CRITERIA:  Ability to meet basic life and health needs Improved stabilization in mood, thinking, and/or behavior Medical problems require only outpatient monitoring Motivation to continue treatment in a less acute level of care  PRELIMINARY DISCHARGE PLAN: Outpatient therapy Participate in family therapy Return to previous living arrangement  PATIENT/FAMILY INVOLVEMENT: This treatment plan has been presented to and reviewed with the patient, Sydney Taylor.  The patient and family have been given the opportunity to ask questions and make suggestions.  Baron Sane, RN 07/12/2020, 4:45 PM

## 2020-07-12 NOTE — BH Assessment (Signed)
Per Kathalene Frames, RN Director/AC, patient has been accepted to [facility], bed 304-2 ; Accepting provider is Dr. Dwyane Dee, MD; Attending provider is Dr. Myles Lipps, MD.    Patient can arrive anytime after 3:00pm. Number for report is [phone].    Tana Coast, RN notified and reports she will notify the EDP.     Radonna Ricker, MSW, LCSW Clinical Social Worker [place]

## 2020-07-12 NOTE — Progress Notes (Signed)
   07/12/20 1940  Psych Admission Type (Psych Patients Only)  Admission Status Voluntary  Psychosocial Assessment  Patient Complaints Anxiety;Depression;Sadness;Insomnia  Eye Contact Fair  Facial Expression Anxious;Sad  Affect Anxious;Depressed;Sad  Speech Logical/coherent  Interaction Assertive  Motor Activity Fidgety  Appearance/Hygiene Unremarkable  Behavior Characteristics Cooperative;Anxious;Appropriate to situation;Calm  Mood Depressed;Anxious;Sad;Pleasant  Thought Process  Coherency WDL  Content Blaming self;Blaming others  Delusions None reported or observed  Perception WDL  Hallucination None reported or observed  Judgment Poor  Confusion None  Danger to Self  Current suicidal ideation? Denies  Self-Injurious Behavior No self-injurious ideation or behavior indicators observed or expressed   Agreement Not to Harm Self Yes  Description of Agreement verbally contracts for safety  Danger to Others  Danger to Others None reported or observed

## 2020-07-12 NOTE — ED Notes (Signed)
ED TO INPATIENT HANDOFF REPORT  ED Nurse Name and Phone #: [phone]  S Name/Age/Gender Sydney Taylor 54 y.o. female Room/Bed: APA04/APA04  Code Status   Code Status: Prior  Home/SNF/Other Home Patient oriented to: self, place, time and situation Is this baseline? Yes   Triage Complete: Triage complete  Chief Complaint Anxiety;Hearing Voices  Triage Note Pt reports SI and anxiety. Pt has plan to overdose on xanax. Pt also reports negative thoughts.     Allergies Allergies  Allergen Reactions  . Keflex [Cephalexin] Itching and Swelling    Level of Care/Admitting Diagnosis ED Disposition    None      B Medical/Surgery History Past Medical History:  Diagnosis Date  . Anemia   . Anxiety   . Arrhythmia   . Bipolar depression ([place]) 12/28/2019  . Chronic headaches   . Depression   . Diabetes mellitus without complication ([place])   . Fibromyalgia   . GERD (gastroesophageal reflux disease)   . HLD (hyperlipidemia)   . Hypertension   . Insect bite   . Prediabetes   . Sleep apnea   . Urinary incontinence    Past Surgical History:  Procedure Laterality Date  . ESOPHAGOGASTRODUODENOSCOPY N/A 05/12/2020   Procedure: ESOPHAGOGASTRODUODENOSCOPY (EGD);  Surgeon: Daneil Dolin, MD;  Location[facility];  Service: Endoscopy;  Laterality: N/A;  11:15am  . Joint fusion on foot Right   . MALONEY DILATION N/A 05/12/2020   Procedure: Venia Minks DILATION;  Surgeon: Daneil Dolin, MD;  Location[facility];  Service: Endoscopy;  Laterality: N/A;  . NASAL SEPTUM SURGERY       A IV Location/Drains/Wounds Patient Lines/Drains/Airways Status    Active Line/Drains/Airways    None          Intake/Output Last 24 hours No intake or output data in the 24 hours ending 07/12/20 1406  Labs/Imaging Results for orders placed or performed during the hospital encounter of 07/11/20 (from the past 48 hour(s))  Comprehensive metabolic panel     Status: Abnormal    Collection Time: 07/11/20  7:23 PM  Result Value Ref Range   Sodium 130 (L) 135 - 145 mmol/L   Potassium 2.3 (LL) 3.5 - 5.1 mmol/L    Comment: CRITICAL RESULT CALLED TO, READ BACK BY AND VERIFIED WITH: DOSS,M ON 07/11/20 AT 2035 BY LOY,C    Chloride 95 (L) 98 - 111 mmol/L   CO2 24 22 - 32 mmol/L   Glucose, Bld 172 (H) 70 - 99 mg/dL    Comment: Glucose reference range applies only to samples taken after fasting for at least 8 hours.   BUN 9 6 - 20 mg/dL   Creatinine, Ser 0.90 0.44 - 1.00 mg/dL   Calcium 8.7 (L) 8.9 - 10.3 mg/dL   Total Protein 6.5 6.5 - 8.1 g/dL   Albumin 3.8 3.5 - 5.0 g/dL   AST 22 15 - 41 U/L   ALT 28 0 - 44 U/L   Alkaline Phosphatase 57 38 - 126 U/L   Total Bilirubin 0.7 0.3 - 1.2 mg/dL   GFR calc non Af Amer >60 >60 mL/min   GFR calc Af Amer >60 >60 mL/min   Anion gap 11 5 - 15    Comment: Performed at [facility], [address], [place], [place] [address]  Ethanol     Status: None   Collection Time: 07/11/20  7:23 PM  Result Value Ref Range   Alcohol, Ethyl (B) <10 <10 mg/dL    Comment: (  NOTE) Lowest detectable limit for serum alcohol is 10 mg/dL.  For medical purposes only. Performed at [facility], [address] [place], [place] [address]   Salicylate level     Status: Abnormal   Collection Time: 07/11/20  7:23 PM  Result Value Ref Range   Salicylate Lvl <3.5 (L) 7.0 - 30.0 mg/dL    Comment: Performed at [facility], [address], [place], [place] [address]  Acetaminophen level     Status: Abnormal   Collection Time: 07/11/20  7:23 PM  Result Value Ref Range   Acetaminophen (Tylenol), Serum <10 (L) 10 - 30 ug/mL    Comment: (NOTE) Therapeutic concentrations vary significantly. A range of 10-30 ug/mL  may be an effective concentration for many patients. However, some  are best treated at concentrations outside of this range. Acetaminophen concentrations >150 ug/mL at 4 hours after ingestion  and >50 ug/mL at 12 hours after ingestion are often  associated with  toxic reactions.  Performed at [facility], [address], [place], [place] [address]   cbc     Status: Abnormal   Collection Time: 07/11/20  7:23 PM  Result Value Ref Range   WBC 11.0 (H) 4.0 - 10.5 K/uL   RBC 4.42 3.87 - 5.11 MIL/uL   Hemoglobin 13.8 12.0 - 15.0 g/dL   HCT 41.2 36 - 46 %   MCV 93.2 80.0 - 100.0 fL   MCH 31.2 26.0 - 34.0 pg   MCHC 33.5 30.0 - 36.0 g/dL   RDW 12.3 11.5 - 15.5 %   Platelets 320 150 - 400 K/uL   nRBC 0.0 0.0 - 0.2 %    Comment: Performed at [facility], [address], [place],  [address]  Rapid urine drug screen (hospital performed)     Status: Abnormal   Collection Time: 07/11/20  7:49 PM  Result Value Ref Range   Opiates POSITIVE (A) NONE DETECTED   Cocaine NONE DETECTED NONE DETECTED   Benzodiazepines NONE DETECTED NONE DETECTED   Amphetamines NONE DETECTED NONE DETECTED   Tetrahydrocannabinol NONE DETECTED NONE DETECTED   Barbiturates NONE DETECTED NONE DETECTED    Comment: (NOTE) DRUG SCREEN FOR MEDICAL PURPOSES ONLY.  IF CONFIRMATION IS NEEDED FOR ANY PURPOSE, NOTIFY LAB WITHIN 5 DAYS.  LOWEST DETECTABLE LIMITS FOR URINE DRUG SCREEN Drug Class                     Cutoff (ng/mL) Amphetamine and metabolites    1000 Barbiturate and metabolites    200 Benzodiazepine                 494 Tricyclics and metabolites     300 Opiates and metabolites        300 Cocaine and metabolites        300 THC                            50 Performed at [facility], [address] [place],  [address]   SARS Coronavirus 2 by RT PCR (hospital order, performed in [facility] hospital lab) Nasopharyngeal Nasopharyngeal Swab     Status: None   Collection Time: 07/11/20 10:00 PM   Specimen: Nasopharyngeal Swab  Result Value Ref Range   SARS Coronavirus 2 NEGATIVE NEGATIVE    Comment: (NOTE) SARS-CoV-2 target nucleic acids are NOT DETECTED.  The SARS-CoV-2 RNA is generally detectable in upper and lower respiratory  specimens during  the acute phase of infection. The lowest concentration of SARS-CoV-2 viral copies this assay can detect is 250 copies / mL. A negative result does not preclude SARS-CoV-2 infection and should not be used as the sole basis for treatment or other patient management decisions.  A negative result may occur with improper specimen collection / handling, submission of specimen other than nasopharyngeal swab, presence of viral mutation(s) within the areas targeted by this assay, and inadequate number of viral copies (<250 copies / mL). A negative result must be combined with clinical observations, patient history, and epidemiological information.  Fact Sheet for Patients:   StrictlyIdeas.no  Fact Sheet for Healthcare Providers: BankingDealers.co.za  This test is not yet approved or  cleared by the [place] FDA and has been authorized for detection and/or diagnosis of SARS-CoV-2 by FDA under an Emergency Use Authorization (EUA).  This EUA will remain in effect (meaning this test can be used) for the duration of the COVID-19 declaration under Section 564(b)(1) of the Act, 21 U.S.C. section 360bbb-3(b)(1), unless the authorization is terminated or revoked sooner.  Performed at [facility], [address], [place], [place] [address]   Comprehensive metabolic panel     Status: Abnormal   Collection Time: 07/12/20  1:32 AM  Result Value Ref Range   Sodium 133 (L) 135 - 145 mmol/L   Potassium 2.9 (L) 3.5 - 5.1 mmol/L    Comment: DELTA CHECK NOTED   Chloride 100 98 - 111 mmol/L   CO2 24 22 - 32 mmol/L   Glucose, Bld 170 (H) 70 - 99 mg/dL    Comment: Glucose reference range applies only to samples taken after fasting for at least 8 hours.   BUN 8 6 - 20 mg/dL   Creatinine, Ser 0.85 0.44 - 1.00 mg/dL   Calcium 7.9 (L) 8.9 - 10.3 mg/dL   Total Protein 5.6 (L) 6.5 - 8.1 g/dL   Albumin 3.3 (L) 3.5 - 5.0 g/dL   AST 20 15 - 41 U/L    ALT 25 0 - 44 U/L   Alkaline Phosphatase 50 38 - 126 U/L   Total Bilirubin 0.5 0.3 - 1.2 mg/dL   GFR calc non Af Amer >60 >60 mL/min   GFR calc Af Amer >60 >60 mL/min   Anion gap 9 5 - 15    Comment: Performed at [facility], [address] [place], [place] [address]  Potassium     Status: None   Collection Time: 07/12/20 10:05 AM  Result Value Ref Range   Potassium 3.5 3.5 - 5.1 mmol/L    Comment: DELTA CHECK NOTED Performed at [facility], [address] [place], [place] [address]   CBG monitoring, ED     Status: Abnormal   Collection Time: 07/12/20  1:11 PM  Result Value Ref Range   Glucose-Capillary 171 (H) 70 - 99 mg/dL    Comment: Glucose reference range applies only to samples taken after fasting for at least 8 hours.   No results found.  Pending Labs Unresulted Labs (From admission, onward) Comment         None      Vitals/Pain Today's Vitals   07/11/20 1845 07/11/20 2312 07/11/20 2330  BP: 120/74 (!) 103/59 110/66  Pulse: 89 83 81  Resp: 18  11  Temp: 99.1 F (37.3 C)    SpO2: 98% 93% 93%  Weight: 108.9 kg    Height: 5\' 10"  (1.778 m)    PainSc: 5  Isolation Precautions No active isolations  Medications Medications  traZODone (DESYREL) tablet 50 mg (50 mg Oral Given 07/11/20 2359)  potassium chloride (KLOR-CON) packet 40 mEq (40 mEq Oral Given 07/11/20 2218)  sodium chloride 0.9 % bolus 1,000 mL (0 mLs Intravenous Stopped 07/12/20 0115)  potassium chloride 10 mEq in 100 mL IVPB (0 mEq Intravenous Stopped 07/12/20 0115)  potassium chloride 10 mEq in 100 mL IVPB (0 mEq Intravenous Stopped 07/12/20 0023)  potassium chloride 10 mEq in 100 mL IVPB (0 mEq Intravenous Stopped 07/12/20 0945)  magnesium sulfate IVPB 2 g 50 mL (0 g Intravenous Stopped 07/12/20 0910)  potassium chloride (KLOR-CON) packet 40 mEq (40 mEq Oral Given 07/12/20 0607)  acetaminophen (TYLENOL) tablet 650 mg (650 mg Oral Given 07/12/20 0819)    Mobility walks Low fall risk    Focused Assessments    R Recommendations: See Admitting Provider Note  Report given to:   Additional Notes:

## 2020-07-12 NOTE — Progress Notes (Signed)
Patient ID: Rian Koon, female   DOB: [birth year], 54 y.o.   MRN: [id number] Admission Note  Pt is a 54 yo female that presents voluntarily on 07/12/2020 with worsening anxiety, depression, financial strain, and marital conflict resulting in the pt having thoughts of overdosing on their medications. Pt states they have been battling with depression and anxiety over the last 2-3 months. Pt states they have dealt with this for the past 30 years but has become increasingly worse. Pt endorses intrusive thoughts that have become increasingly worse. Pt states they quit their job willingly 3 years ago but their spouse has put increasing pressure to work again so they wouldn't be the sole provider. This along with stress about getting in shape to work has caused more anxiety. The pt states they don't drive as often, as driving also causes anxiety. Pt states they have a PCP; Dr. Anastasio Champion in [place]. Pt also has a therapist that they started seeing a month ago. Pt denies any past physical/verbal/sexual abuse. Pt endorses present self neglect. Pt states their husband, brother, sister and a good friend are their support. Pt denies any drug/tobacco/alcohol use/abuse. Pt denies endorses passive SI with no plan while in the hospital. Pt agrees to approach staff if this become apparent or before harming others while at [facility] [place] denies a hx of avh. Consents signed, handbook detailing the patient's rights, responsibilities, and visitor guidelines provided. Skin/belongings search completed and patient oriented to unit. Patient stable at this time. Patient given the opportunity to express concerns and ask questions. Patient given toiletries. Will continue to monitor.   [facility] Assessment 07/11/2020:  Patient is a 54 year old female presenting voluntarily to [facility] ED with a primary complaint of suicidal ideation. Patient reports for 1 month an increase in depression, anxiety, and "negative, intrusive thoughts I cant control telling me  I should kill myself." She cites marital conflict as primary stressors. Patient endorses SI with thoughts of overdosing. Today she saw her therapist, Janett Billow at [facility], who recommended she come to the ED for evaluation. She denies HI/AVH. Patient states she was hospitalized in [place] approximately 1 month ago for SI but discharged after 2 days because hospital was out of network. She denies any substance use or trauma history. Patient reports she started taking Latuda, Trazedone, Buspar, and Xanax PRN 1 month ago but she has not seen a change in her mood. She sees a PMH[facility] at [facility]. Patient fives verbal consent for TTS to contact her husband, Herbie Baltimore, at [phone], for collateral information if necessary.

## 2020-07-12 NOTE — ED Notes (Signed)
[facility] notified that K+ has improved.

## 2020-07-12 NOTE — Progress Notes (Signed)
   07/12/20 1940  COVID-19 Daily Checkoff  Have you had a fever (temp > 37.80C/100F)  in the past 24 hours?  No  COVID-19 EXPOSURE  Have you traveled outside the state in the past 14 days? No  Have you been in contact with someone with a confirmed diagnosis of COVID-19 or PUI in the past 14 days without wearing appropriate PPE? No  Have you been living in the same home as a person with confirmed diagnosis of COVID-19 or a PUI (household contact)? No  Have you been diagnosed with COVID-19? No

## 2020-07-13 DIAGNOSIS — F332 Major depressive disorder, recurrent severe without psychotic features: Secondary | ICD-10-CM

## 2020-07-13 LAB — GLUCOSE, CAPILLARY
Glucose-Capillary: 132 mg/dL — ABNORMAL HIGH (ref 70–99)
Glucose-Capillary: 155 mg/dL — ABNORMAL HIGH (ref 70–99)
Glucose-Capillary: 110 mg/dL — ABNORMAL HIGH (ref 70–99)
Glucose-Capillary: 179 mg/dL — ABNORMAL HIGH (ref 70–99)

## 2020-07-13 MED ORDER — FLUVOXAMINE MALEATE 50 MG PO TABS
25.0000 mg | ORAL_TABLET | Freq: Every day | ORAL | Status: DC
  Administered 2020-07-13: 25 mg via ORAL
  Filled 2020-07-13 (×2): qty 1

## 2020-07-13 MED ORDER — GABAPENTIN 100 MG PO CAPS
200.0000 mg | ORAL_CAPSULE | Freq: Three times a day (TID) | ORAL | Status: DC
  Administered 2020-07-13 – 2020-07-16 (×6): 200 mg via ORAL
  Filled 2020-07-13 (×17): qty 2

## 2020-07-13 MED ORDER — POTASSIUM CHLORIDE CRYS ER 20 MEQ PO TBCR
20.0000 meq | EXTENDED_RELEASE_TABLET | Freq: Every day | ORAL | Status: DC
  Administered 2020-07-13 – 2020-07-17 (×5): 20 meq via ORAL
  Filled 2020-07-13 (×7): qty 1

## 2020-07-13 MED ORDER — LOSARTAN POTASSIUM 25 MG PO TABS
25.0000 mg | ORAL_TABLET | Freq: Every day | ORAL | Status: DC
  Administered 2020-07-13 – 2020-07-17 (×4): 25 mg via ORAL
  Filled 2020-07-13 (×6): qty 1

## 2020-07-13 MED ORDER — PANTOPRAZOLE SODIUM 40 MG PO TBEC
40.0000 mg | DELAYED_RELEASE_TABLET | Freq: Two times a day (BID) | ORAL | Status: DC
  Administered 2020-07-13 – 2020-07-17 (×8): 40 mg via ORAL
  Filled 2020-07-13 (×11): qty 1

## 2020-07-13 MED ORDER — METOPROLOL TARTRATE 50 MG PO TABS
50.0000 mg | ORAL_TABLET | Freq: Two times a day (BID) | ORAL | Status: DC
  Administered 2020-07-13 – 2020-07-17 (×8): 50 mg via ORAL
  Filled 2020-07-13 (×11): qty 1
  Filled 2020-07-13: qty 2

## 2020-07-13 NOTE — Progress Notes (Signed)
   07/13/20 2100  Psych Admission Type (Psych Patients Only)  Admission Status Voluntary  Psychosocial Assessment  Patient Complaints Depression;Anxiety  Eye Contact Fair  Facial Expression Anxious;Pensive;Sullen;Sad;Worried  Affect Anxious;Depressed;Sad;Sullen  Catering manager Activity Slow  Appearance/Hygiene Unremarkable  Behavior Characteristics Cooperative;Anxious  Mood Depressed;Anxious  Thought Process  Coherency WDL  Content WDL  Delusions Somatic  Perception WDL  Hallucination None reported or observed  Judgment Poor  Confusion None  Danger to Self  Current suicidal ideation? Denies  Danger to Others  Danger to Others None reported or observed   Pt rates depression 8/10 and anxiety 8/10. Pt rates her fibromyalgia pain at 5/10. Pt endorses nausea and was given an anti-emetic.

## 2020-07-13 NOTE — [facility] Suicide Risk Assessment (Signed)
[facility] Admission Suicide Risk Assessment   Nursing information obtained from:  Patient Demographic factors:  Caucasian, Unemployed Current Mental Status:  Suicidal ideation indicated by patient, Plan includes specific time, place, or method, Intention to act on suicide plan, Self-harm thoughts, Belief that plan would result in death, Suicide plan Loss Factors:  Decrease in vocational status, Financial problems / change in socioeconomic status, Decline in physical health Historical Factors:  Impulsivity Risk Reduction Factors:  Sense of responsibility to family, Positive social support, Positive coping skills or problem solving skills, Positive therapeutic relationship, Living with another person, especially a relative  Total Time spent with patient: 30 minutes Principal Problem: <principal problem not specified> Diagnosis:  Active Problems:   Major depression  Subjective Data: Patient is seen and examined.  Patient is a 54 year old female who presented to the [facility] emergency department on 07/11/2020 with suicidal ideation.  The patient stated that she had been depressed for the last 1 to 2 months.  She complained of problems with depression, anxiety, intrusive thoughts, and physical symptoms.  She stated that much of this started several months ago when her husband gave her an ultimatum of getting a job, or that she would have to be out of the relationship.  She stated that she had suicidal thoughts of overdosing.  She had seen her therapist on the date of admission and recommended going to the emergency department for evaluation.  The patient stated she had been most recently hospitalized at a hospital in [place], [place].  She had been placed on Latuda, trazodone, BuSpar and Xanax.  She stated that she had been on and off Xanax for many years.  She stated she had taken multiple medications in the past including lithium, Lamictal and many other medicines that she was unable to recall.  Review of  the electronic medical record also revealed that she had been previously treated with amitriptyline, Rexulti. She was very somatic throughout the interview.  She is worried about her dizziness, and multiple other physical complaints.  Review of the electronic medical record revealed complaints of abdominal pain, nausea, body aches, dysphagia, tick bite, dizziness, unstable gait.  She has been diagnosed with reflux disease and had been controlled on AcipHex.  She had had a colonoscopy done that was apparently negative.  She had an EGD done on 05/12/2020.  It does appear that she had a Maloney dilatation done on her esophagus on 05/12/2020.  A prescription for Nexium was sent in on 05/12/2020.  She contacted her gastroenterologist on 5/17 stating that she was having trouble swallowing her pills again.  She was referred for speech pathology because of her dysphagia.  Physical therapy assessment revealed that she had globus sensation with all solids.  She apparently had had a tick bite in early in June 2021 and reported to the emergency department with body aches.  She had already been prescribed doxycycline for this.  She again presented to her outpatient nurse practitioner with abdominal pain.  This was continued from previous experience.  She apparently had been placed on Protonix 40 mg p.o. twice daily.  There are multiple visits for orthopedic surgeons as far back as 2014 at the [facility] for midfoot arthritis and arthrodesis.  Unfortunately none of these notes appear to have any of her psychiatric medications present.  She was admitted to the hospital for evaluation and stabilization.  Continued Clinical Symptoms:  Alcohol Use Disorder Identification Test Final Score (AUDIT): 0 The "Alcohol Use Disorders Identification Test", Guidelines  for Use in Primary Care, Second Edition.  World Pharmacologist [facility]). Score between 0-7:  no or low risk or alcohol related problems. Score between 8-15:   moderate risk of alcohol related problems. Score between 16-19:  high risk of alcohol related problems. Score 20 or above:  warrants further diagnostic evaluation for alcohol dependence and treatment.   CLINICAL FACTORS:   Severe Anxiety and/or Agitation Depression:   Anhedonia Hopelessness Impulsivity Insomnia Obsessive-Compulsive Disorder Personality Disorders:   Cluster B Chronic Pain Previous Psychiatric Diagnoses and Treatments   Musculoskeletal: Strength & Muscle Tone: decreased Gait & Station: broad based Patient leans: N/A  Psychiatric Specialty Exam: Physical Exam Vitals and nursing note reviewed.  Constitutional:      Appearance: Normal appearance.  HENT:     Head: Normocephalic and atraumatic.  Pulmonary:     Effort: Pulmonary effort is normal.  Neurological:     General: No focal deficit present.     Mental Status: She is alert and oriented to person, place, and time.     Review of Systems  Blood pressure (!) 147/88, pulse (!) 102, temperature 98 F (36.7 C), temperature source Oral, resp. rate 16, height 5\' 10"  (1.778 m), weight 108 kg, SpO2 97 %.Body mass index is 34.15 kg/m.  General Appearance: Disheveled  Eye Contact:  Fair  Speech:  Normal Rate  Volume:  Decreased  Mood:  Anxious and Depressed  Affect:  Congruent  Thought Process:  Coherent and Descriptions of Associations: Circumstantial  Orientation:  Full (Time, Place, and Person)  Thought Content:  Rumination  Suicidal Thoughts:  Yes.  without intent/plan  Homicidal Thoughts:  No  Memory:  Immediate;   Poor Recent;   Poor Remote;   Poor  Judgement:  Impaired  Insight:  Lacking  Psychomotor Activity:  Psychomotor Retardation  Concentration:  Concentration: Fair and Attention Span: Fair  Recall:  [employer] of Knowledge:  Fair  Language:  Fair  Akathisia:  Negative  Handed:  Right  AIMS (if indicated):     Assets:  Desire for Improvement Resilience  ADL's:  Intact  Cognition:  WNL   Sleep:  Number of Hours: 6.25      COGNITIVE FEATURES THAT CONTRIBUTE TO RISK:  Thought constriction (tunnel vision)    SUICIDE RISK:   Mild:  Suicidal ideation of limited frequency, intensity, duration, and specificity.  There are no identifiable plans, no associated intent, mild dysphoria and related symptoms, good self-control (both objective and subjective assessment), few other risk factors, and identifiable protective factors, including available and accessible social support.  PLAN OF CARE: Patient is seen and examined.  Patient is a 54 year old female with the above-stated past psychiatric history who is seen on admission secondary to suicidal ideation.  She will be admitted to the hospital.  She will be integrated in the milieu.  She will be encouraged to attend groups.  I am not exactly sure what her diagnosis is at this point.  She does report having some manic type symptoms in her 20s or 30s, but none since then.  She clearly has had his depression, but also a great deal of frustration from her physical standpoint as well as her psychiatric.  She is slightly disappointed that she is not getting to see a medical consultation.  I told her I was a general internist would assist with the care of that, and that we would attempt to help her fibromyalgia.  She stated that she had been on gabapentin and Lyrica  in the past, but they had both made her very sedated.  She did not recognize Luvox use in the past.  She has been on Lamictal multiple times, and was apparently on it for at least 15 years.  She is unable to recall whether or not it was beneficial.  I will start gabapentin 100 mg p.o. twice daily for anxiety and monitor for oversedation.  I will start her on Luvox 25 mg p.o. nightly and titrate that for her intrusive thoughts.  This may also help with her somatization over her multiple issues.  I will go on and write for a physical therapy consult.  She complains of dizziness, but on further  examination she stated this is been going on for some time.  Her blood pressure is mildly elevated today.  From one of the recent visits to her outpatient clinic she was apparently taking chlorthalidone, and I will order that.  The list of medications including amitriptyline, Rexulti, doxycycline, empagliflozin, Nexium, exenatide, fluticasone, Lamictal and Zofran were all stopped.  We will continue Protonix 40 mg p.o. twice daily for her dysphagia and probable esophageal stricture.  She will be placed on Librium 25 mg p.o. 3 times daily as needed anxiety.  We will monitor her blood sugar 4 times a day.  We will do a sliding scale insulin, but I will hold her Jardiance for now.  Given her nausea we will continue the Zofran for now.  We will get collateral information from her husband when possible.  Review of her admission laboratories revealed a mildly low potassium at 3.5.  This will be supplemented.  Her blood sugar was elevated at 170 on admission, and this morning was 132.  Her creatinine is normal at 0.85.  Liver function enzymes were normal.  Her CBC showed a mild elevation of her white blood cell count at 11, but otherwise normal.  Her acetaminophen was less than 10, salicylate was less than 7.  Her hemoglobin A1c was 6.4.  Blood alcohol was less than 10, salicylate was less than 7.  Her drug screen was positive for opiates.  Review of the PMP database did not reveal any prescriptions for opiates, but she did receive a prescription for Xanax 0.5 mg on 07/05/2020.  She was only given 15 tablets.  I certify that inpatient services furnished can reasonably be expected to improve the patient's condition.   Sharma Covert, MD 07/13/2020, 11:36 AM

## 2020-07-13 NOTE — Plan of Care (Signed)
Progress note  D: pt found in bed; compliant with medication administration. Pt denies having any "lightheadedness" that was impacting them this morning. Pt was encouraged to rise slowly and allow their feet to dangle first before exiting the bed. Pt still presents anxious, sad, and sullen. Pt still endorses passive si with no plan, but agrees to approach staff before harming self while at [facility]. Pt is still preoccupied with medications that haven't been restarted. Pt was educated about meal selection with their HTN this A.M. pt is pleasant. Pt denies si/hi/ah/vh and verbally agrees to approach staff if these become apparent or before harming themself/others while at [facility].  A: Pt provided support and encouragement. Pt given medication per protocol and standing orders. Q53m safety checks implemented and continued.  R: Pt safe on the unit. Will continue to monitor.  Pt progressing in the following metrics  Problem: Education: Goal: Utilization of techniques to improve thought processes will improve Outcome: Progressing   Problem: Health Behavior/Discharge Planning: Goal: Ability to make decisions will improve Outcome: Progressing   Problem: Safety: Goal: Ability to disclose and discuss suicidal ideas will improve Outcome: Progressing   Problem: Self-Concept: Goal: Level of anxiety will decrease Outcome: Progressing

## 2020-07-13 NOTE — H&P (Signed)
Psychiatric Admission Assessment Adult  Patient Identification: Sydney Taylor MRN:  [id number] Date of Evaluation:  07/13/2020 Chief Complaint:  Major depression [F32.9] Principal Diagnosis: <principal problem not specified> Diagnosis:  Active Problems:   Major depression  History of Present Illness: Patient is seen and examined.  Patient is a 54 year old female who presented to the [facility] emergency department on 07/11/2020 with suicidal ideation.  The patient stated that she had been depressed for the last 1 to 2 months.  She complained of problems with depression, anxiety, intrusive thoughts, and physical symptoms.  She stated that much of this started several months ago when her husband gave her an ultimatum of getting a job, or that she would have to be out of the relationship.  She stated that she had suicidal thoughts of overdosing.  She had seen her therapist on the date of admission and recommended going to the emergency department for evaluation.  The patient stated she had been most recently hospitalized at a hospital in [place], [place].  She had been placed on Latuda, trazodone, BuSpar and Xanax.  She stated that she had been on and off Xanax for many years.  She stated she had taken multiple medications in the past including lithium, Lamictal and many other medicines that she was unable to recall.  Review of the electronic medical record also revealed that she had been previously treated with amitriptyline, Rexulti. She was very somatic throughout the interview.  She is worried about her dizziness, and multiple other physical complaints.  Review of the electronic medical record revealed complaints of abdominal pain, nausea, body aches, dysphagia, tick bite, dizziness, unstable gait.  She has been diagnosed with reflux disease and had been controlled on AcipHex.  She had had a colonoscopy done that was apparently negative.  She had an EGD done on 05/12/2020.  It does appear that she had a  Maloney dilatation done on her esophagus on 05/12/2020.  A prescription for Nexium was sent in on 05/12/2020.  She contacted her gastroenterologist on 5/17 stating that she was having trouble swallowing her pills again.  She was referred for speech pathology because of her dysphagia.  Physical therapy assessment revealed that she had globus sensation with all solids.  She apparently had had a tick bite in early in June 2021 and reported to the emergency department with body aches.  She had already been prescribed doxycycline for this.  She again presented to her outpatient nurse practitioner with abdominal pain.  This was continued from previous experience.  She apparently had been placed on Protonix 40 mg p.o. twice daily.  There are multiple visits for orthopedic surgeons as far back as 2014 at the [facility] for midfoot arthritis and arthrodesis.  Unfortunately none of these notes appear to have any of her psychiatric medications present.  She was admitted to the hospital for evaluation and stabilization.  Associated Signs/Symptoms: Depression Symptoms:  depressed mood, anhedonia, insomnia, psychomotor retardation, fatigue, feelings of worthlessness/guilt, difficulty concentrating, hopelessness, suicidal thoughts without plan, anxiety, loss of energy/fatigue, disturbed sleep, (Hypo) Manic Symptoms:  Impulsivity, Anxiety Symptoms:  Excessive Worry, Psychotic Symptoms:  denied PTSD Symptoms: Negative Total Time spent with patient: 45 minutes  Past Psychiatric History: Patient stated she had only had 2 psychiatric hospitalizations during her lifetime.  This hospitalization and no one in [place], [place] last month or so.  She has been treated with multiple medications in the past.  Please see subjective data for that information.  Is the patient at  risk to self? Yes.    Has the patient been a risk to self in the past 6 months? Yes.    Has the patient been a risk to self within  the distant past? No.  Is the patient a risk to others? No.  Has the patient been a risk to others in the past 6 months? No.  Has the patient been a risk to others within the distant past? No.   Prior Inpatient Therapy:   Prior Outpatient Therapy:    Alcohol Screening: 1. How often do you have a drink containing alcohol?: Never 2. How many drinks containing alcohol do you have on a typical day when you are drinking?: 1 or 2 3. How often do you have six or more drinks on one occasion?: Never AUDIT-C Score: 0 4. How often during the last year have you found that you were not able to stop drinking once you had started?: Never 5. How often during the last year have you failed to do what was normally expected from you because of drinking?: Never 6. How often during the last year have you needed a first drink in the morning to get yourself going after a heavy drinking session?: Never 7. How often during the last year have you had a feeling of guilt of remorse after drinking?: Never 8. How often during the last year have you been unable to remember what happened the night before because you had been drinking?: Never 9. Have you or someone else been injured as a result of your drinking?: No 10. Has a relative or friend or a doctor or another health worker been concerned about your drinking or suggested you cut down?: No Alcohol Use Disorder Identification Test Final Score (AUDIT): 0 Substance Abuse History in the last 12 months:  No. Consequences of Substance Abuse: Negative Previous Psychotropic Medications: Yes  Psychological Evaluations: Yes  Past Medical History:  Past Medical History:  Diagnosis Date  . Anemia   . Anxiety   . Arrhythmia   . Bipolar depression ([place]) 12/28/2019  . Chronic headaches   . Depression   . Diabetes mellitus without complication ([place])   . Fibromyalgia   . GERD (gastroesophageal reflux disease)   . HLD (hyperlipidemia)   . Hypertension   . Insect bite   .  Prediabetes   . Sleep apnea   . Urinary incontinence     Past Surgical History:  Procedure Laterality Date  . ESOPHAGOGASTRODUODENOSCOPY N/A 05/12/2020   Procedure: ESOPHAGOGASTRODUODENOSCOPY (EGD);  Surgeon: Daneil Dolin, MD;  Location: [facility];  Service: Endoscopy;  Laterality: N/A;  11:15am  . Joint fusion on foot Right   . MALONEY DILATION N/A 05/12/2020   Procedure: Venia Minks DILATION;  Surgeon: Daneil Dolin, MD;  Location: [facility];  Service: Endoscopy;  Laterality: N/A;  . NASAL SEPTUM SURGERY     Family History:  Family History  Problem Relation Age of Onset  . Hypertension Father   . Hyperlipidemia Father   . Diabetes Father   . Hypertension Mother   . Hyperlipidemia Mother   . Diabetes Mellitus II Mother   . Congestive Heart Failure Mother   . Hypertension Sister   . Diabetes Sister   . Hypertension Brother   . Diabetes Maternal Grandmother   . Lupus Paternal Grandmother   . Colon cancer Neg Hx   . Esophageal cancer Neg Hx   . Gastric cancer Neg Hx    Family Psychiatric  History: Patient reported  multiple family members with psychiatric illness as well as a completed suicide. Tobacco Screening:   Social History:  Social History   Substance and Sexual Activity  Alcohol Use Not Currently  . Alcohol/week: 0.0 standard drinks     Social History   Substance and Sexual Activity  Drug Use No    Additional Social History:                           Allergies:   Allergies  Allergen Reactions  . Keflex [Cephalexin] Itching and Swelling   Lab Results:  Results for orders placed or performed during the hospital encounter of 07/12/20 (from the past 48 hour(s))  Hemoglobin A1c     Status: Abnormal   Collection Time: 07/12/20  5:43 PM  Result Value Ref Range   Hgb A1c MFr Bld 6.4 (H) 4.8 - 5.6 %    Comment: (NOTE) Pre diabetes:          5.7%-6.4%  Diabetes:              >6.4%  Glycemic control for   <7.0% adults with diabetes     Mean Plasma Glucose 136.98 mg/dL    Comment: Performed at [facility], [name] [address], [place], [place] [address]  Glucose, capillary     Status: Abnormal   Collection Time: 07/13/20  6:05 AM  Result Value Ref Range   Glucose-Capillary 132 (H) 70 - 99 mg/dL    Comment: Glucose reference range applies only to samples taken after fasting for at least 8 hours.  Glucose, capillary     Status: Abnormal   Collection Time: 07/13/20 11:57 AM  Result Value Ref Range   Glucose-Capillary 155 (H) 70 - 99 mg/dL    Comment: Glucose reference range applies only to samples taken after fasting for at least 8 hours.    Blood Alcohol level:  Lab Results  Component Value Date   ETH <10 20/35/5974    Metabolic Disorder Labs:  Lab Results  Component Value Date   HGBA1C 6.4 (H) 07/12/2020   MPG 136.98 07/12/2020   MPG 318 03/27/2020   No results found for: PROLACTIN Lab Results  Component Value Date   CHOL 208 (H) 01/21/2020   TRIG 208 (H) 01/21/2020   HDL 40 (L) 01/21/2020   CHOLHDL 5.2 (H) 01/21/2020   LDLCALC 133 (H) 01/21/2020   [place] 122 08/26/2016    Current Medications: Current Facility-Administered Medications  Medication Dose Route Frequency Provider Last Rate Last Admin  . acetaminophen (TYLENOL) tablet 650 mg  650 mg Oral Q6H PRN Sharma Covert, MD      . alum & mag hydroxide-simeth (MAALOX/MYLANTA) 200-200-20 MG/5ML suspension 30 mL  30 mL Oral Q4H PRN Sharma Covert, MD      . chlordiazePOXIDE (LIBRIUM) capsule 25 mg  25 mg Oral TID PRN Sharma Covert, MD      . chlorthalidone (HYGROTON) tablet 25 mg  25 mg Oral Daily Sharma Covert, MD   25 mg at 07/13/20 0756  . fluvoxaMINE (LUVOX) tablet 25 mg  25 mg Oral QHS Sharma Covert, MD      . gabapentin (NEURONTIN) capsule 200 mg  200 mg Oral TID Sharma Covert, MD   200 mg at 07/13/20 1213  . hydrOXYzine (ATARAX/VISTARIL) tablet 25 mg  25 mg Oral TID PRN Sharma Covert, MD   25 mg at 07/12/20 2121   . insulin aspart (  novoLOG) injection 0-15 Units  0-15 Units Subcutaneous TID WC Sharma Covert, MD   3 Units at 07/13/20 1212  . losartan (COZAAR) tablet 25 mg  25 mg Oral Daily Sharma Covert, MD   25 mg at 07/13/20 1327  . magnesium hydroxide (MILK OF MAGNESIA) suspension 30 mL  30 mL Oral Daily PRN Sharma Covert, MD      . ondansetron [facility]) tablet 4 mg  4 mg Oral Q8H PRN Sharma Covert, MD      . pantoprazole (PROTONIX) EC tablet 40 mg  40 mg Oral BID Sharma Covert, MD      . potassium chloride SA (KLOR-CON) CR tablet 20 mEq  20 mEq Oral Daily Sharma Covert, MD   20 mEq at 07/13/20 1327  . traZODone (DESYREL) tablet 50 mg  50 mg Oral QHS PRN Sharma Covert, MD   50 mg at 07/12/20 2121   PTA Medications: Medications Prior to Admission  Medication Sig Dispense Refill Last Dose  . ALPRAZolam (XANAX) 0.25 MG tablet Take 0.25 mg by mouth 2 (two) times daily as needed for anxiety.     . busPIRone (BUSPAR) 5 MG tablet Take 10 mg by mouth at bedtime.      . chlorthalidone (HYGROTON) 25 MG tablet Take 1 tablet (25 mg total) by mouth daily.     . Cholecalciferol (VITAMIN D-3) 125 MCG (5000 UT) TABS Take 2 tablets by mouth daily.     Marland Kitchen estradiol (ESTRACE) 0.1 MG/GM vaginal cream Place 1 Applicatorful vaginally 3 (three) times a week. Administer 1 applicator vaginally daily for 2 weeks, then administer 1 applicator vaginally 3 times per week. 42.5 g 3   . glipiZIDE (GLUCOTROL) 10 MG tablet TAKE 1 TABLET (10 MG TOTAL) BY MOUTH 2 (TWO) TIMES DAILY BEFORE A MEAL. 180 tablet 0   . lidocaine (XYLOCAINE) 2 % solution Use as directed 15 mLs in the mouth or throat every 3 (three) hours as needed for mouth pain. (Patient not taking: Reported on 07/12/2020) 300 mL 0   . losartan (COZAAR) 25 MG tablet Take 25 mg by mouth daily.     Marland Kitchen lurasidone (LATUDA) 40 MG TABS tablet Take 20 mg by mouth daily after supper.      . metoprolol tartrate (LOPRESSOR) 50 MG tablet Take 50 mg by mouth  2 (two) times daily.      . pantoprazole (PROTONIX) 40 MG tablet Take 40 mg by mouth in the morning and at bedtime.     . potassium chloride 20 MEQ/15ML (10%) SOLN Take 15 mLs (20 mEq total) by mouth 3 (three) times daily. 630 mL 2   . traZODone (DESYREL) 50 MG tablet Take 50 mg by mouth at bedtime.       Musculoskeletal: Strength & Muscle Tone: decreased Gait & Station: broad based Patient leans: N/A  Psychiatric Specialty Exam: Physical Exam Vitals and nursing note reviewed.  HENT:     Head: Normocephalic and atraumatic.  Pulmonary:     Effort: Pulmonary effort is normal.  Neurological:     General: No focal deficit present.     Mental Status: She is alert and oriented to person, place, and time.     Review of Systems  Blood pressure (!) 149/87, pulse (!) 104, temperature 98 F (36.7 C), temperature source Oral, resp. rate 16, height 5\' 10"  (1.778 m), weight 108 kg, SpO2 97 %.Body mass index is 34.15 kg/m.  General Appearance: Disheveled  Eye Contact:  Fair  Speech:  Slow  Volume:  Decreased  Mood:  Anxious and Depressed  Affect:  Congruent  Thought Process:  Coherent and Descriptions of Associations: Intact  Orientation:  Full (Time, Place, and Person)  Thought Content:  Rumination  Suicidal Thoughts:  Yes.  without intent/plan  Homicidal Thoughts:  No  Memory:  Immediate;   Fair Recent;   Fair Remote;   Fair  Judgement:  Impaired  Insight:  Lacking  Psychomotor Activity:  Decreased  Concentration:  Concentration: Fair and Attention Span: Fair  Recall:  [employer] of Knowledge:  Fair  Language:  Good  Akathisia:  Negative  Handed:  Right  AIMS (if indicated):     Assets:  Desire for Improvement Resilience  ADL's:  Intact  Cognition:  WNL  Sleep:  Number of Hours: 6.25    Treatment Plan Summary: Daily contact with patient to assess and evaluate symptoms and progress in treatment, Medication management and Plan : Patient is seen and examined.  Patient is a  54 year old female with the above-stated past psychiatric history who is seen on admission secondary to suicidal ideation.  She will be admitted to the hospital.  She will be integrated in the milieu.  She will be encouraged to attend groups.  I am not exactly sure what her diagnosis is at this point.  She does report having some manic type symptoms in her 20s or 30s, but none since then.  She clearly has had his depression, but also a great deal of frustration from her physical standpoint as well as her psychiatric.  She is slightly disappointed that she is not getting to see a medical consultation.  I told her I was a general internist would assist with the care of that, and that we would attempt to help her fibromyalgia.  She stated that she had been on gabapentin and Lyrica in the past, but they had both made her very sedated.  She did not recognize Luvox use in the past.  She has been on Lamictal multiple times, and was apparently on it for at least 15 years.  She is unable to recall whether or not it was beneficial.  I will start gabapentin 100 mg p.o. twice daily for anxiety and monitor for oversedation.  I will start her on Luvox 25 mg p.o. nightly and titrate that for her intrusive thoughts.  This may also help with her somatization over her multiple issues.  I will go on and write for a physical therapy consult.  She complains of dizziness, but on further examination she stated this is been going on for some time.  Her blood pressure is mildly elevated today.  From one of the recent visits to her outpatient clinic she was apparently taking chlorthalidone, and I will order that.  The list of medications including amitriptyline, Rexulti, doxycycline, empagliflozin, Nexium, exenatide, fluticasone, Lamictal and Zofran were all stopped.  We will continue Protonix 40 mg p.o. twice daily for her dysphagia and probable esophageal stricture.  She will be placed on Librium 25 mg p.o. 3 times daily as needed anxiety.   We will monitor her blood sugar 4 times a day.  We will do a sliding scale insulin, but I will hold her Jardiance for now.  Given her nausea we will continue the Zofran for now.  We will get collateral information from her husband when possible.  Review of her admission laboratories revealed a mildly low potassium at 3.5.  This will be supplemented.  Her blood sugar was elevated at 170 on admission, and this morning was 132.  Her creatinine is normal at 0.85.  Liver function enzymes were normal.  Her CBC showed a mild elevation of her white blood cell count at 11, but otherwise normal.  Her acetaminophen was less than 10, salicylate was less than 7.  Her hemoglobin A1c was 6.4.  Blood alcohol was less than 10, salicylate was less than 7.  Her drug screen was positive for opiates.  Review of the PMP database did not reveal any prescriptions for opiates, but she did receive a prescription for Xanax 0.5 mg on 07/05/2020.  She was only given 15 tablets.  Observation Level/Precautions:  15 minute checks  Laboratory:  Chemistry Profile  Psychotherapy:    Medications:    Consultations:    Discharge Concerns:    Estimated LOS:  Other:     Physician Treatment Plan for Primary Diagnosis: <principal problem not specified> Long Term Goal(s): Improvement in symptoms so as ready for discharge  Short Term Goals: Ability to identify changes in lifestyle to reduce recurrence of condition will improve, Ability to verbalize feelings will improve, Ability to disclose and discuss suicidal ideas, Ability to demonstrate self-control will improve, Ability to identify and develop effective coping behaviors will improve and Ability to maintain clinical measurements within normal limits will improve  Physician Treatment Plan for Secondary Diagnosis: Active Problems:   Major depression  Long Term Goal(s): Improvement in symptoms so as ready for discharge  Short Term Goals: Ability to identify changes in lifestyle to reduce  recurrence of condition will improve, Ability to verbalize feelings will improve, Ability to disclose and discuss suicidal ideas, Ability to demonstrate self-control will improve, Ability to identify and develop effective coping behaviors will improve and Ability to maintain clinical measurements within normal limits will improve  I certify that inpatient services furnished can reasonably be expected to improve the patient's condition.    Sharma Covert, MD 7/15/20213:00 PM

## 2020-07-13 NOTE — Progress Notes (Signed)
Pt c/o feeling dizzy and unsteady this morning while ambulating. Pt notified the MHT and the MHT offered to walk pt back to her room for safety, but pt refused. Pt's vital signs were rechecked and no signs of orthostatic hypotension were noted. Pt's blood pressure while sitting was 149/93 and pulse 88. Standing her blood pressure was 147/88 and pulse 102. Pt denies feeling light-headed or dizzy when changing positions in bed or getting out of bed. Pt said it's only when she starts ambulating. Pt encouraged to rest and call staff for assistance with ambulating. She also has non-skid socks on. Oncoming nurse has been notified.

## 2020-07-13 NOTE — Progress Notes (Signed)
Pt shares that her main stressor has been her "mean husband." She reports that he's said "critical things" to her like "you're not the person I married." Pt said that he's been unhappy with her for "quite awhile" and has also been pushing her to get a job. Pt doesn't feel like she'll be able to hold a job anymore. She also endorses that she continues to have intrusive, negative thoughts in her head. She said the thoughts are "in my own voice." They say "you're a stupid bitch, your husband will leave you no matter what you do." Pt's goal is to "feel good" and identifies one of her current coping skills as going on short walks. Active listening, reassurance, and support provided. Pt also encouraged to attend groups to develop more coping skills, but has been anxious due to being in a new environment. She denies SI/HI and AVH. She verbally contracts for safety. Medications administered as ordered by MD. Q 15 min safety checks continue. Safety has been maintained.

## 2020-07-13 NOTE — Progress Notes (Signed)
Pt did attend the evening wrap up group. Pt was attentive, supportive, and sharing. Positive thinking and positive change were discussed.

## 2020-07-13 NOTE — Progress Notes (Signed)
   07/13/20 2112  Vital Signs  Pulse Rate (!) 124  BP (!) 127/112  BP Location Left Arm  BP Method Automatic  Patient Position (if appropriate) Standing  Oxygen Therapy  SpO2 95 %   Pt c/o dizziness and nausea. Pt VS assessed. Pt BP and pulse elevated. Pt has been given BP meds today. Pt also states that she takes metoprolol 50 mg for elevated pulse, but has not been given it for 2 days. "I have asked for it but have not received it yet." Provider notified. Awaiting any new orders. Pt does not have PRN medication ordered for elevated BP or pulse. Will continue to monitor.

## 2020-07-14 DIAGNOSIS — F339 Major depressive disorder, recurrent, unspecified: Secondary | ICD-10-CM

## 2020-07-14 DIAGNOSIS — F322 Major depressive disorder, single episode, severe without psychotic features: Secondary | ICD-10-CM

## 2020-07-14 LAB — GLUCOSE, CAPILLARY
Glucose-Capillary: 160 mg/dL — ABNORMAL HIGH (ref 70–99)
Glucose-Capillary: 154 mg/dL — ABNORMAL HIGH (ref 70–99)
Glucose-Capillary: 129 mg/dL — ABNORMAL HIGH (ref 70–99)
Glucose-Capillary: 224 mg/dL — ABNORMAL HIGH (ref 70–99)
Glucose-Capillary: 168 mg/dL — ABNORMAL HIGH (ref 70–99)
Glucose-Capillary: 221 mg/dL — ABNORMAL HIGH (ref 70–99)

## 2020-07-14 MED ORDER — FLUVOXAMINE MALEATE 50 MG PO TABS
50.0000 mg | ORAL_TABLET | Freq: Every day | ORAL | Status: DC
  Filled 2020-07-14 (×2): qty 1

## 2020-07-14 MED ORDER — MECLIZINE HCL 12.5 MG PO TABS
12.5000 mg | ORAL_TABLET | Freq: Two times a day (BID) | ORAL | Status: DC
  Administered 2020-07-14 – 2020-07-17 (×6): 12.5 mg via ORAL
  Filled 2020-07-14 (×9): qty 1

## 2020-07-14 MED ORDER — FLUVOXAMINE MALEATE 50 MG PO TABS
75.0000 mg | ORAL_TABLET | Freq: Every day | ORAL | Status: DC
  Administered 2020-07-14 – 2020-07-17 (×4): 75 mg via ORAL
  Filled 2020-07-14 (×5): qty 2

## 2020-07-14 NOTE — Evaluation (Signed)
Physical Therapy Evaluation Patient Details Name: Sydney Taylor MRN: [id number] DOB: 1966-11-15 Today's Date: 07/14/2020   History of Present Illness  Pt with extensive history for depression and in for SI , also withhx of DM, fibromyalgia, bipolar depression, and anxiety.  Clinical Impression  Pt presented with very flat affect, however very cooperative during assessment. History was a little vague , seemed she has been having this "light headedness" for at least a year now, and comes nad goes, and seems to last a little , but occasionally subsides. Does nto sound vestibular in nature or BPPV, however did perform some positional movment, eye ROM at end range and stabilization tests with NO noted nystagmus and not provoking dizziness. Pt statedd she had similar tests , along with others the caloric ear test, and others at an ENT, PT, and neurologist in [place] within the year (?) with no definitive results.  We stood and walked around and that seemed to provoke a mild " lightheadedness" while walking, ortho statics taken with no change in BP remained same in standing and sitting 105/73. Unsure of the cause of the lightheadedness from our standpoint. No need to follow by PT at this point. Pt is mobile and safe , aware of when she feels this way to take more careful steps.     Follow Up Recommendations No PT follow up    Equipment Recommendations  None recommended by PT    Recommendations for Other Services       Precautions / Restrictions Precautions Precautions: None      Mobility  Bed Mobility Overal bed mobility: Independent                Transfers Overall transfer level: Independent Equipment used: None                Ambulation/Gait Ambulation/Gait assistance: Independent Gait Distance (Feet): 150 Feet Assistive device: None   Gait velocity: slow, steady   General Gait Details: slow, bvut steady and occassionally holds to wall or furniture , but not all  the time, Just slightly guarded seeming to be unsure of her " lightheadness" feeleing she states when she is up and moving around  [employer] Mobility    Modified Rankin (Stroke Patients Only)       Balance Overall balance assessment: Mild deficits observed, not formally tested                                           Pertinent Vitals/Pain Pain Assessment: No/denies pain    Home Living Family/patient expects to be discharged to:: Private residence Living Arrangements: Spouse/significant other Available Help at Discharge: Family Type of Home: House                Prior Function Level of Independence: Independent               Hand Dominance        Extremity/Trunk Assessment        Lower Extremity Assessment Lower Extremity Assessment: Overall WFL for tasks assessed       Communication   Communication: No difficulties (flat , sullen affect)  Cognition Arousal/Alertness: Awake/alert Behavior During Therapy: WFL for tasks assessed/performed Overall Cognitive Status: Within Functional Limits for tasks assessed  General Comments General comments (skin integrity, edema, etc.): pt just very careful with her walkig in room and hallway    Exercises     Assessment/Plan    PT Assessment Patent does not need any further PT services  PT Problem List         PT Treatment Interventions      PT Goals (Current goals can be found in the Care Plan section)  Acute Rehab PT Goals Patient Stated Goal: I want to feel better PT Goal Formulation: All assessment and education complete, DC therapy    Frequency     Barriers to discharge        Co-evaluation               AM-PAC PT "6 Clicks" Mobility  Outcome Measure Help needed turning from your back to your side while in a flat bed without using bedrails?: None Help needed moving from lying on your  back to sitting on the side of a flat bed without using bedrails?: None Help needed moving to and from a bed to a chair (including a wheelchair)?: None Help needed standing up from a chair using your arms (e.g., wheelchair or bedside chair)?: None Help needed to walk in hospital room?: None Help needed climbing 3-5 steps with a railing? : None 6 Click Score: 24    End of Session   Activity Tolerance: Patient tolerated treatment well Patient left:  (in hallway and nurses station) Nurse Communication: Mobility status      Time: 1630-1650 PT Time Calculation (min) (ACUTE ONLY): 20 min   Charges:   PT Evaluation $PT Eval Low Complexity: 1 Low          Dorthy Magnussen, PT, MPT Acute Rehabilitation Services Office: [phone] Pager: [phone] 07/14/2020   Clide Dales 07/14/2020, 7:02 PM

## 2020-07-14 NOTE — Progress Notes (Signed)
[facility] MD Progress Note  07/14/2020 2:00 PM Sydney Taylor  MRN:  [id number]  Subjective: Sydney Taylor reports, "I'm still feeling depressed. I'm also feeling kind of dizzy. I woke up this morning feeling that way. It started yesterday. I have had an episodes in the past, lasted for few days, then went away. I'm still feeling like hurting myself due to my bad depression. My depression worsened because of marital problems. My suicidal thoughts has lasted for about 2 months. I hear a voice, but it is my own voice. I feel very tired today".  Objective: Patient is a 54 year old female who presented to the [facility] emergency department on 07/11/2020 with suicidal ideation. The patient stated that she had been depressed for the last 1 to 2 months. She complained of problems with depression, anxiety, intrusive thoughts, and physical symptoms. She stated that much of this started several months ago when her husband gave her an ultimatum of getting a job, or that she would have to be out of the relationship. She stated that she had suicidal thoughts of overdosing. She had seen her therapist on the date of admission and recommended going to the emergency department for evaluation. The patient stated she had been most recently hospitalized at a hospital in [place], [place]. She had been placed on Latuda, trazodone, BuSpar and Xanax. She stated that she had been on and off Xanax for many years. Sydney Taylor is seen, chart reviewed. The chart findings discussed with the treatment team. She is lying down in bed. She is complaining of feeling dizzy & light-headed. She says she woke-up feeling that way. She adds that she has had such episodes in the past, lasted few days & resolved without treatment. However, the nurse reports that she has been coming out to take her medications & able to go to the cafeteria to get her meals. Sydney Taylor continues to endorse feeling depressed, suicidal & hearing a voice, which she says was her  own voice. She is taking & tolerating her treatment regimen. Denies any side effects. She denies any intent or plan to hurt herself here & able to verbally contract for safety. She rates her depression #10 & anxiety #8. She denies any VH, delusions or paranoia. She does not appear to be responding to any internal stimuli. Increased Luvox to 75 mg po daily.   Principal Problem: Major depressive disorder, single episode, severe (HCC)  Diagnosis: Principal Problem:   Major depressive disorder, single episode, severe (HCC) Active Problems:   Major depression  Total Time spent with patient: 25 minutes  Past Psychiatric History: See H&P  Past Medical History:  Past Medical History:  Diagnosis Date  . Anemia   . Anxiety   . Arrhythmia   . Bipolar depression ([place]) 12/28/2019  . Chronic headaches   . Depression   . Diabetes mellitus without complication ([place])   . Fibromyalgia   . GERD (gastroesophageal reflux disease)   . HLD (hyperlipidemia)   . Hypertension   . Insect bite   . Prediabetes   . Sleep apnea   . Urinary incontinence     Past Surgical History:  Procedure Laterality Date  . ESOPHAGOGASTRODUODENOSCOPY N/A 05/12/2020   Procedure: ESOPHAGOGASTRODUODENOSCOPY (EGD);  Surgeon: Daneil Dolin, MD;  Location: [facility];  Service: Endoscopy;  Laterality: N/A;  11:15am  . Joint fusion on foot Right   . MALONEY DILATION N/A 05/12/2020   Procedure: Venia Minks DILATION;  Surgeon: Daneil Dolin, MD;  Location: [facility];  Service: Endoscopy;  Laterality: N/A;  . NASAL SEPTUM SURGERY     Family History:  Family History  Problem Relation Age of Onset  . Hypertension Father   . Hyperlipidemia Father   . Diabetes Father   . Hypertension Mother   . Hyperlipidemia Mother   . Diabetes Mellitus II Mother   . Congestive Heart Failure Mother   . Hypertension Sister   . Diabetes Sister   . Hypertension Brother   . Diabetes Maternal Grandmother   . Lupus Paternal Grandmother    . Colon cancer Neg Hx   . Esophageal cancer Neg Hx   . Gastric cancer Neg Hx    Family Psychiatric  History: See H&P  Social History:  Social History   Substance and Sexual Activity  Alcohol Use Not Currently  . Alcohol/week: 0.0 standard drinks     Social History   Substance and Sexual Activity  Drug Use No    Social History   Socioeconomic History  . Marital status: Married    Spouse name: Not on file  . Number of children: 0  . Years of education: Not on file  . Highest education level: Not on file  Occupational History  . Occupation: research associate/drug development  Tobacco Use  . Smoking status: Former Smoker    Types: Cigarettes    Quit date: 12/30/1982    Years since quitting: 37.5  . Smokeless tobacco: Never Used  Vaping Use  . Vaping Use: Never used  Substance and Sexual Activity  . Alcohol use: Not Currently    Alcohol/week: 0.0 standard drinks  . Drug use: No  . Sexual activity: Not Currently  Other Topics Concern  . Not on file  Social History Narrative   Married for 3 years.Lives with husband.Bachelors in Education officer, museum.   Social Determinants of Health   Financial Resource Strain:   . Difficulty of Paying Living Expenses:   Food Insecurity:   . Worried About Charity fundraiser in the Last Year:   . Arboriculturist in the Last Year:   Transportation Needs:   . Film/video editor (Medical):   Marland Kitchen Lack of Transportation (Non-Medical):   Physical Activity:   . Days of Exercise per Week:   . Minutes of Exercise per Session:   Stress:   . Feeling of Stress :   Social Connections:   . Frequency of Communication with Friends and Family:   . Frequency of Social Gatherings with Friends and Family:   . Attends Religious Services:   . Active Member of Clubs or Organizations:   . Attends Archivist Meetings:   Marland Kitchen Marital Status:    Additional Social History:   Sleep: Good (Sleeping all morning).  Appetite:  Good  Current  Medications: Current Facility-Administered Medications  Medication Dose Route Frequency Provider Last Rate Last Admin  . acetaminophen (TYLENOL) tablet 650 mg  650 mg Oral Q6H PRN Sharma Covert, MD      . alum & mag hydroxide-simeth (MAALOX/MYLANTA) 200-200-20 MG/5ML suspension 30 mL  30 mL Oral Q4H PRN Sharma Covert, MD      . chlordiazePOXIDE (LIBRIUM) capsule 25 mg  25 mg Oral TID PRN Sharma Covert, MD      . chlorthalidone (HYGROTON) tablet 25 mg  25 mg Oral Daily Sharma Covert, MD   25 mg at 07/14/20 0810  . fluvoxaMINE (LUVOX) tablet 50 mg  50 mg Oral QHS Sharma Covert, MD      . gabapentin (  NEURONTIN) capsule 200 mg  200 mg Oral TID Sharma Covert, MD   200 mg at 07/14/20 1225  . hydrOXYzine (ATARAX/VISTARIL) tablet 25 mg  25 mg Oral TID PRN Sharma Covert, MD   25 mg at 07/13/20 2125  . insulin aspart (novoLOG) injection 0-15 Units  0-15 Units Subcutaneous TID WC Sharma Covert, MD   5 Units at 07/14/20 1227  . losartan (COZAAR) tablet 25 mg  25 mg Oral Daily Sharma Covert, MD   25 mg at 07/14/20 0810  . magnesium hydroxide (MILK OF MAGNESIA) suspension 30 mL  30 mL Oral Daily PRN Sharma Covert, MD      . metoprolol tartrate (LOPRESSOR) tablet 50 mg  50 mg Oral BID Anike, Adaku C, NP   50 mg at 07/14/20 0810  . ondansetron (ZOFRAN) tablet 4 mg  4 mg Oral Q8H PRN Sharma Covert, MD   4 mg at 07/13/20 2125  . pantoprazole (PROTONIX) EC tablet 40 mg  40 mg Oral BID Sharma Covert, MD   40 mg at 07/14/20 0811  . potassium chloride SA (KLOR-CON) CR tablet 20 mEq  20 mEq Oral Daily Sharma Covert, MD   20 mEq at 07/14/20 0810  . traZODone (DESYREL) tablet 50 mg  50 mg Oral QHS PRN Sharma Covert, MD   50 mg at 07/13/20 2224    Lab Results:  Results for orders placed or performed during the hospital encounter of 07/12/20 (from the past 48 hour(s))  Glucose, capillary     Status: Abnormal   Collection Time: 07/12/20  5:03 PM   Result Value Ref Range   Glucose-Capillary 160 (H) 70 - 99 mg/dL    Comment: Performed at [facility], [address] [place], [place] [address]  Hemoglobin A1c     Status: Abnormal   Collection Time: 07/12/20  5:43 PM  Result Value Ref Range   Hgb A1c MFr Bld 6.4 (H) 4.8 - 5.6 %    Comment: (NOTE) Pre diabetes:          5.7%-6.4%  Diabetes:              >6.4%  Glycemic control for   <7.0% adults with diabetes    Mean Plasma Glucose 136.98 mg/dL    Comment: Performed at [facility], [place] [address] [place], [place] [address]  Glucose, capillary     Status: Abnormal   Collection Time: 07/12/20  9:23 PM  Result Value Ref Range   Glucose-Capillary 154 (H) 70 - 99 mg/dL    Comment: Performed at [facility], [address] [place], [place] [address]  Glucose, capillary     Status: Abnormal   Collection Time: 07/13/20  6:05 AM  Result Value Ref Range   Glucose-Capillary 132 (H) 70 - 99 mg/dL    Comment: Glucose reference range applies only to samples taken after fasting for at least 8 hours.  Glucose, capillary     Status: Abnormal   Collection Time: 07/13/20 11:57 AM  Result Value Ref Range   Glucose-Capillary 155 (H) 70 - 99 mg/dL    Comment: Glucose reference range applies only to samples taken after fasting for at least 8 hours.  Glucose, capillary     Status: Abnormal   Collection Time: 07/13/20  5:16 PM  Result Value Ref Range   Glucose-Capillary 110 (H) 70 - 99 mg/dL    Comment: Glucose reference range applies only to samples taken  after fasting for at least 8 hours.   Comment 1 Notify RN    Comment 2 Document in Chart   Glucose, capillary     Status: Abnormal   Collection Time: 07/13/20  8:59 PM  Result Value Ref Range   Glucose-Capillary 179 (H) 70 - 99 mg/dL    Comment: Glucose reference range applies only to samples taken after fasting for at least 8 hours.   Comment 1 Notify RN    Comment 2 Document in Chart   Glucose, capillary      Status: Abnormal   Collection Time: 07/14/20  5:53 AM  Result Value Ref Range   Glucose-Capillary 129 (H) 70 - 99 mg/dL    Comment: Glucose reference range applies only to samples taken after fasting for at least 8 hours.   Comment 1 Notify RN    Comment 2 Document in Chart   Glucose, capillary     Status: Abnormal   Collection Time: 07/14/20 11:44 AM  Result Value Ref Range   Glucose-Capillary 224 (H) 70 - 99 mg/dL    Comment: Glucose reference range applies only to samples taken after fasting for at least 8 hours.   Blood Alcohol level:  Lab Results  Component Value Date   ETH <10 71/05/2693   Metabolic Disorder Labs: Lab Results  Component Value Date   HGBA1C 6.4 (H) 07/12/2020   MPG 136.98 07/12/2020   MPG 318 03/27/2020   No results found for: PROLACTIN Lab Results  Component Value Date   CHOL 208 (H) 01/21/2020   TRIG 208 (H) 01/21/2020   HDL 40 (L) 01/21/2020   CHOLHDL 5.2 (H) 01/21/2020   LDLCALC 133 (H) 01/21/2020   LDLCALC 122 08/26/2016   Physical Findings: AIMS: Facial and Oral Movements Muscles of Facial Expression: None, normal Lips and Perioral Area: None, normal Jaw: None, normal Tongue: None, normal,Extremity Movements Upper (arms, wrists, hands, fingers): None, normal Lower (legs, knees, ankles, toes): None, normal, Trunk Movements Neck, shoulders, hips: None, normal, Overall Severity Severity of abnormal movements (highest score from questions above): None, normal Incapacitation due to abnormal movements: None, normal Patient's awareness of abnormal movements (rate only patient's report): No Awareness, Dental Status Current problems with teeth and/or dentures?: No Does patient usually wear dentures?: No  CIWA:    COWS:     Musculoskeletal: Strength & Muscle Tone: within normal limits Gait & Station: normal Patient leans: N/A  Psychiatric Specialty Exam: Physical Exam Vitals and nursing note reviewed.  HENT:     Head: Normocephalic.      Nose: Nose normal.     Mouth/Throat:     Pharynx: Oropharynx is clear.  Eyes:     Pupils: Pupils are equal, round, and reactive to light.  Cardiovascular:     Rate and Rhythm: Normal rate.     Pulses: Normal pulses.  Pulmonary:     Effort: Pulmonary effort is normal.  Genitourinary:    Comments: Deferred Musculoskeletal:        General: Normal range of motion.     Cervical back: Normal range of motion.  Skin:    General: Skin is warm and dry.  Neurological:     Mental Status: She is alert and oriented to person, place, and time.     Review of Systems  Constitutional: Negative for chills, diaphoresis and fever.  HENT: Negative for congestion, rhinorrhea, sneezing and sore throat.   Eyes: Negative for discharge.  Respiratory: Negative for chest tightness, shortness of breath and wheezing.  Cardiovascular: Negative for chest pain and palpitations.  Gastrointestinal: Negative for diarrhea, nausea and vomiting.  Endocrine: Negative for cold intolerance.  Genitourinary: Negative for difficulty urinating.  Musculoskeletal: Negative for arthralgias and myalgias.  Allergic/Immunologic:       Allergies: Keflex  Neurological: Positive for dizziness (c/o), weakness (c/o) and light-headedness (c/o). Negative for tremors, seizures, syncope, facial asymmetry, speech difficulty, numbness and headaches.  Psychiatric/Behavioral: Positive for dysphoric mood and suicidal ideas. Negative for agitation, behavioral problems, confusion, decreased concentration, hallucinations, self-injury and sleep disturbance (Able to contract for safety, verbally). The patient is not nervous/anxious and is not hyperactive.     Blood pressure 98/65, pulse 68, temperature 98.4 F (36.9 C), temperature source Oral, resp. rate 16, height 5\' 10"  (1.778 m), weight 108 kg, SpO2 100 %.Body mass index is 34.15 kg/m.  General Appearance: Disheveled  Eye Contact:  Fair  Speech:  Slow  Volume:  Decreased  Mood:  Anxious  and Depressed  Affect:  Congruent  Thought Process:  Coherent and Descriptions of Associations: Intact  Orientation:  Full (Time, Place, and Person)  Thought Content:  Rumination  Suicidal Thoughts:  Yes.  without intent/plan  Homicidal Thoughts:  No  Memory:  Immediate;   Fair Recent;   Fair Remote;   Fair  Judgement:  Impaired  Insight:  Lacking  Psychomotor Activity:  Decreased  Concentration:  Concentration: Fair and Attention Span: Fair  Recall:  [employer] of Knowledge:  Fair  Language:  Good  Akathisia:  Negative  Handed:  Right  AIMS (if indicated):     Assets:  Desire for Improvement Resilience  ADL's:  Intact  Cognition:  WNL    Sleep:  Number of Hours: 4.25   Treatment Plan Summary: Daily contact with patient to assess and evaluate symptoms and progress in treatment and Medication management.  - Continue inpatient hospitalization. - Will continue today 07/14/2020 plan as below except where it is noted.  Depression.    - Increased Luvox from 50 mg to 75 mg po daily.  Anxiety.    - Continue  Librium 25 mg po tid prn.    - Continue Vistaril 25 mg po tid prn.  Agitation/DM neuropathy.    - Continue gabapentin 200 mg po tid.  Insomnia.    - Continue Trazodone 50 mg po Q hs prn.  Other medical issues.    - Continue (chlorthalidone) [place] 25 mg po daily for HTN.    - Continue sliding scale insulin 0-15 units as recommended for high blood sugar levels.    - Continue losartan 25 mg po daily for HTN.    - Continue Zofran 4 mg po Q 8 hrs prn for n/v.    - Continue Protonix 40 mg po Q am for GERD.    - Continue Kdur 40 m  Encourage out of bed to attend group sessions. Discharge disposition plan in progress.  Lindell Spar, NP, PMHNP, FNP-BC 07/14/2020, 2:00 PM

## 2020-07-14 NOTE — [facility] Suicide Risk Assessment (Signed)
[name] INPATIENT:  Family/Significant Other Suicide Prevention Education  Suicide Prevention Education:  Contact Attempts: Husband Sydney Taylor, (name of family member/significant other) has been identified by the patient as the family member/significant other with whom the patient will be residing, and identified as the person(s) who will aid the patient in the event of a mental health crisis.  With written consent from the patient, two attempts were made to provide suicide prevention education, prior to and/or following the patient's discharge.  We were unsuccessful in providing suicide prevention education.  A suicide education pamphlet was given to the patient to share with family/significant other.  Date and time of first attempt:236pm 07/14/20 Date and time of second attempt: Sydney Taylor 07/14/2020, 2:35 PM

## 2020-07-14 NOTE — Tx Team (Signed)
Interdisciplinary Treatment and Diagnostic Plan Update  07/14/2020 Time of Session: 9:20am Sydney Taylor MRN: [id number]  Principal Diagnosis: <principal problem not specified>  Secondary Diagnoses: Active Problems:   Major depression   Current Medications:  Current Facility-Administered Medications  Medication Dose Route Frequency Provider Last Rate Last Admin  . acetaminophen (TYLENOL) tablet 650 mg  650 mg Oral Q6H PRN Sharma Covert, MD      . alum & mag hydroxide-simeth (MAALOX/MYLANTA) 200-200-20 MG/5ML suspension 30 mL  30 mL Oral Q4H PRN Sharma Covert, MD      . chlordiazePOXIDE (LIBRIUM) capsule 25 mg  25 mg Oral TID PRN Sharma Covert, MD      . chlorthalidone (HYGROTON) tablet 25 mg  25 mg Oral Daily Sharma Covert, MD   25 mg at 07/14/20 0810  . fluvoxaMINE (LUVOX) tablet 50 mg  50 mg Oral QHS Sharma Covert, MD      . gabapentin (NEURONTIN) capsule 200 mg  200 mg Oral TID Sharma Covert, MD   200 mg at 07/14/20 0810  . hydrOXYzine (ATARAX/VISTARIL) tablet 25 mg  25 mg Oral TID PRN Sharma Covert, MD   25 mg at 07/13/20 2125  . insulin aspart (novoLOG) injection 0-15 Units  0-15 Units Subcutaneous TID WC Sharma Covert, MD   2 Units at 07/14/20 0615  . losartan (COZAAR) tablet 25 mg  25 mg Oral Daily Sharma Covert, MD   25 mg at 07/14/20 0810  . magnesium hydroxide (MILK OF MAGNESIA) suspension 30 mL  30 mL Oral Daily PRN Sharma Covert, MD      . metoprolol tartrate (LOPRESSOR) tablet 50 mg  50 mg Oral BID Anike, Adaku C, NP   50 mg at 07/14/20 0810  . ondansetron (ZOFRAN) tablet 4 mg  4 mg Oral Q8H PRN Sharma Covert, MD   4 mg at 07/13/20 2125  . pantoprazole (PROTONIX) EC tablet 40 mg  40 mg Oral BID Sharma Covert, MD   40 mg at 07/14/20 0811  . potassium chloride SA (KLOR-CON) CR tablet 20 mEq  20 mEq Oral Daily Sharma Covert, MD   20 mEq at 07/14/20 0810  . traZODone (DESYREL) tablet 50 mg  50 mg Oral QHS PRN  Sharma Covert, MD   50 mg at 07/13/20 2224   PTA Medications: Medications Prior to Admission  Medication Sig Dispense Refill Last Dose  . ALPRAZolam (XANAX) 0.25 MG tablet Take 0.25 mg by mouth 2 (two) times daily as needed for anxiety.     . busPIRone (BUSPAR) 5 MG tablet Take 10 mg by mouth at bedtime.      . chlorthalidone (HYGROTON) 25 MG tablet Take 1 tablet (25 mg total) by mouth daily.     . Cholecalciferol (VITAMIN D-3) 125 MCG (5000 UT) TABS Take 2 tablets by mouth daily.     Marland Kitchen estradiol (ESTRACE) 0.1 MG/GM vaginal cream Place 1 Applicatorful vaginally 3 (three) times a week. Administer 1 applicator vaginally daily for 2 weeks, then administer 1 applicator vaginally 3 times per week. 42.5 g 3   . glipiZIDE (GLUCOTROL) 10 MG tablet TAKE 1 TABLET (10 MG TOTAL) BY MOUTH 2 (TWO) TIMES DAILY BEFORE A MEAL. 180 tablet 0   . lidocaine (XYLOCAINE) 2 % solution Use as directed 15 mLs in the mouth or throat every 3 (three) hours as needed for mouth pain. (Patient not taking: Reported on 07/12/2020) 300 mL 0   . losartan (  COZAAR) 25 MG tablet Take 25 mg by mouth daily.     Marland Kitchen lurasidone (LATUDA) 40 MG TABS tablet Take 20 mg by mouth daily after supper.      . metoprolol tartrate (LOPRESSOR) 50 MG tablet Take 50 mg by mouth 2 (two) times daily.      . pantoprazole (PROTONIX) 40 MG tablet Take 40 mg by mouth in the morning and at bedtime.     . potassium chloride 20 MEQ/15ML (10%) SOLN Take 15 mLs (20 mEq total) by mouth 3 (three) times daily. 630 mL 2   . traZODone (DESYREL) 50 MG tablet Take 50 mg by mouth at bedtime.       Patient Stressors: Financial difficulties Health problems Marital or family conflict Occupational concerns  Patient Strengths: Ability for insight Average or above average intelligence Capable of independent living Motivation for treatment/growth Physical Health Supportive family/friends  Treatment Modalities: Medication Management, Group therapy, Case management,   1 to 1 session with clinician, Psychoeducation, Recreational therapy.   Physician Treatment Plan for Primary Diagnosis: <principal problem not specified> Long Term Goal(s): Improvement in symptoms so as ready for discharge Improvement in symptoms so as ready for discharge   Short Term Goals: Ability to identify changes in lifestyle to reduce recurrence of condition will improve Ability to verbalize feelings will improve Ability to disclose and discuss suicidal ideas Ability to demonstrate self-control will improve Ability to identify and develop effective coping behaviors will improve Ability to maintain clinical measurements within normal limits will improve Ability to identify changes in lifestyle to reduce recurrence of condition will improve Ability to verbalize feelings will improve Ability to disclose and discuss suicidal ideas Ability to demonstrate self-control will improve Ability to identify and develop effective coping behaviors will improve Ability to maintain clinical measurements within normal limits will improve  Medication Management: Evaluate patient's response, side effects, and tolerance of medication regimen.  Therapeutic Interventions: 1 to 1 sessions, Unit Group sessions and Medication administration.  Evaluation of Outcomes: Not Met  Physician Treatment Plan for Secondary Diagnosis: Active Problems:   Major depression  Long Term Goal(s): Improvement in symptoms so as ready for discharge Improvement in symptoms so as ready for discharge   Short Term Goals: Ability to identify changes in lifestyle to reduce recurrence of condition will improve Ability to verbalize feelings will improve Ability to disclose and discuss suicidal ideas Ability to demonstrate self-control will improve Ability to identify and develop effective coping behaviors will improve Ability to maintain clinical measurements within normal limits will improve Ability to identify changes in  lifestyle to reduce recurrence of condition will improve Ability to verbalize feelings will improve Ability to disclose and discuss suicidal ideas Ability to demonstrate self-control will improve Ability to identify and develop effective coping behaviors will improve Ability to maintain clinical measurements within normal limits will improve     Medication Management: Evaluate patient's response, side effects, and tolerance of medication regimen.  Therapeutic Interventions: 1 to 1 sessions, Unit Group sessions and Medication administration.  Evaluation of Outcomes: Not Met   RN Treatment Plan for Primary Diagnosis: <principal problem not specified> Long Term Goal(s): Knowledge of disease and therapeutic regimen to maintain health will improve  Short Term Goals: Ability to remain free from injury will improve, Ability to verbalize frustration and anger appropriately will improve, Ability to disclose and discuss suicidal ideas, Ability to identify and develop effective coping behaviors will improve and Compliance with prescribed medications will improve  Medication Management: RN will administer  medications as ordered by provider, will assess and evaluate patient's response and provide education to patient for prescribed medication. RN will report any adverse and/or side effects to prescribing provider.  Therapeutic Interventions: 1 on 1 counseling sessions, Psychoeducation, Medication administration, Evaluate responses to treatment, Monitor vital signs and CBGs as ordered, Perform/monitor CIWA, COWS, AIMS and Fall Risk screenings as ordered, Perform wound care treatments as ordered.  Evaluation of Outcomes: Not Met   LCSW Treatment Plan for Primary Diagnosis: <principal problem not specified> Long Term Goal(s): Safe transition to appropriate next level of care at discharge, Engage patient in therapeutic group addressing interpersonal concerns.  Short Term Goals: Engage patient in  aftercare planning with referrals and resources, Increase social support, Identify triggers associated with mental health/substance abuse issues and Increase skills for wellness and recovery  Therapeutic Interventions: Assess for all discharge needs, 1 to 1 time with Social worker, Explore available resources and support systems, Assess for adequacy in community support network, Educate family and significant other(s) on suicide prevention, Complete Psychosocial Assessment, Interpersonal group therapy.  Evaluation of Outcomes: Not Met   Progress in Treatment: Attending groups: Yes. Participating in groups: Yes. Taking medication as prescribed: Yes. Toleration medication: Yes. Family/Significant other contact made: No, will contact:  if consent is given. Patient understands diagnosis: Yes. Discussing patient identified problems/goals with staff: Yes. Medical problems stabilized or resolved: Yes. Denies suicidal/homicidal ideation: Yes. Issues/concerns per patient self-inventory: No.   New problem(s) identified: No, Describe:  none.  New Short Term/Long Term Goal(s): medication stabilization, elimination of SI thoughts, development of comprehensive mental wellness plan.   Patient Goals:  "To get my anxiety under control and to be less depressed"  Discharge Plan or Barriers: Patient recently admitted. CSW will continue to follow and assess for appropriate referrals and possible discharge planning.   Reason for Continuation of Hospitalization: Anxiety Depression Medication stabilization  Estimated Length of Stay: 3-5 days   Attendees: Patient: Sydney Taylor 07/14/2020   Physician: Myles Lipps, MD 07/14/2020  Nursing:  07/14/2020   RN Care Manager: 07/14/2020   Social Worker: Darletta Moll, [place] 07/14/2020  Recreational Therapist:  07/14/2020   Other:  07/14/2020  Other:  07/14/2020   Other: 07/14/2020      Scribe for Treatment Team: Vassie Moselle, [place] 07/14/2020 11:29  AM

## 2020-07-14 NOTE — Progress Notes (Signed)
Pt endorses feeling depressed today.  Denies HI/SI/AVH.  Complains of feeling dizzy like she was going to "pass out."  Vitals obtained and pt's BP low at 88/55, HR 57. Pt encouraged to drink fluids and rest.   Pt's heart rate increased to 76 bpm  10 minutes later.  And at 1730, pt's BP increased to 105/73, HR 85.  Pt's blood sugar 224 at 1145 and 168 at 1630.  RN assessed for needs and concerns.  Pt is sitting in dayroom at this time. Q 15 minutes safety checks remain in place.
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
## 2020-07-15 LAB — GLUCOSE, CAPILLARY
Glucose-Capillary: 152 mg/dL — ABNORMAL HIGH (ref 70–99)
Glucose-Capillary: 171 mg/dL — ABNORMAL HIGH (ref 70–99)
Glucose-Capillary: 137 mg/dL — ABNORMAL HIGH (ref 70–99)
Glucose-Capillary: 157 mg/dL — ABNORMAL HIGH (ref 70–99)

## 2020-07-15 MED ORDER — GLIPIZIDE 2.5 MG HALF TABLET
2.5000 mg | ORAL_TABLET | Freq: Two times a day (BID) | ORAL | Status: DC
  Filled 2020-07-15 (×2): qty 1

## 2020-07-15 MED ORDER — TRAZODONE HCL 50 MG PO TABS
50.0000 mg | ORAL_TABLET | Freq: Every evening | ORAL | Status: DC | PRN
  Administered 2020-07-16: 50 mg via ORAL
  Filled 2020-07-15 (×2): qty 1

## 2020-07-15 NOTE — Progress Notes (Signed)
   07/15/20 2349  COVID-19 Daily Checkoff  Have you had a fever (temp > 37.80C/100F)  in the past 24 hours?  No  If you have had runny nose, nasal congestion, sneezing in the past 24 hours, has it worsened? No  COVID-19 EXPOSURE  Have you traveled outside the state in the past 14 days? No  Have you been in contact with someone with a confirmed diagnosis of COVID-19 or PUI in the past 14 days without wearing appropriate PPE? No  Have you been living in the same home as a person with confirmed diagnosis of COVID-19 or a PUI (household contact)? No  Have you been diagnosed with COVID-19? No

## 2020-07-15 NOTE — BHH Group Notes (Signed)
[facility] LCSW Group Therapy Note  Date/Time:    07/15/2020 10:00-11:00AM  Type of Therapy and Topic:  Group Therapy:  Healthy vs Unhealthy Coping Skills  Participation Level:  Active   Description of Group:  The focus of this group was to determine what unhealthy coping techniques typically are used by group members and what healthy coping techniques would be helpful in coping with various problems. Patients were guided in becoming aware of the differences between healthy and unhealthy coping techniques.  Patients were asked to identify 1-2 healthy coping skills they would like to learn to use more effectively, and many mentioned meditation, breathing, and relaxation.  At the end of group, additional ideas of healthy coping skills were shared in a fun exercise.  Therapeutic Goals Patients learned that coping is what human beings do all day long to deal with various situations in their lives Patients defined and discussed healthy vs unhealthy coping techniques Patients identified their preferred coping techniques and identified whether these were healthy or unhealthy Patients determined 1-2 healthy coping skills they would like to become more familiar with and use more often Patients provided support and ideas to each other  Summary of Patient Progress: During group, patient expressed that her healthy coping skills include talking to people who care and walking.  Her unhealthy coping mechanisms include withdrawal, overeating, and rumination.  We discussed each of these, and CSW encouraged her to consider attendance at virtual [employer] meetings as one possibly healthy coping skill.  Rumination was discussed in detail and a mindfulness exercise was practiced, which the patient stated she found helpful as it did keep her from ruminating during that period of time.   Therapeutic Modalities Cognitive Behavioral Therapy Motivational Interviewing   Selmer Dominion, LCSW 07/15/2020, 9:21  AM

## 2020-07-15 NOTE — Plan of Care (Signed)
Mostly in bed. Out of bed for medications and meals. Sad and depressed and not willing to discuss her feelings with staff. Received AM medications and returned to bed. Was encouraged to express feelings and needs. Safety precautions reinforced.

## 2020-07-15 NOTE — Progress Notes (Signed)
   07/14/20 2053  Psych Admission Type (Psych Patients Only)  Admission Status Voluntary  Psychosocial Assessment  Patient Complaints Depression;Anxiety  Eye Contact Fair  Facial Expression Flat  Affect Appropriate to circumstance  Speech Logical/coherent  Interaction Minimal  Appearance/Hygiene Unremarkable  Behavior Characteristics Cooperative  Mood Depressed;Anxious  Thought Process  Coherency WDL  Content WDL  Delusions WDL  Perception WDL  Hallucination None reported or observed  Judgment WDL  Confusion WDL  Danger to Self  Current suicidal ideation? Denies  Danger to Others  Danger to Others None reported or observed  Patient reports her depression has not improved since admission but currently is not having any active suicidal thoughts.Denies dizziness at this time. Gait steady.

## 2020-07-15 NOTE — Progress Notes (Signed)
   07/15/20 2352  Psych Admission Type (Psych Patients Only)  Admission Status Voluntary  Psychosocial Assessment  Patient Complaints Anxiety  Eye Contact Fair  Facial Expression Flat  Affect Appropriate to circumstance  Speech Logical/coherent  Interaction Assertive  Motor Activity Slow  Appearance/Hygiene Unremarkable  Behavior Characteristics Appropriate to situation  Mood Depressed  Thought Process  Coherency WDL  Content WDL  Delusions None reported or observed  Perception WDL  Hallucination None reported or observed  Judgment Limited  Confusion None  Danger to Self  Current suicidal ideation? Denies  Self-Injurious Behavior No self-injurious ideation or behavior indicators observed or expressed   Agreement Not to Harm Self Yes  Description of Agreement verbally contracts for safety  Danger to Others  Danger to Others None reported or observed

## 2020-07-15 NOTE — Progress Notes (Signed)
[facility] MD Progress Note  07/15/2020 11:27 AM Sydney Taylor  MRN:  [id number]  Subjective: Sydney Taylor reports, "I'm still feeling dizzy today. I feel like my ankles are swollen today. Can you guys reach out to my husband to discuss with him what my diagnosis is & how I'm trying to get better. If it is possible, I would like it to be inform of family meeting. My depression today is at #8 & anxiety #7. I have no suicidal thoughts today".  Objective: Patient is a 54 year old female who presented to the [facility] emergency department on 07/11/2020 with suicidal ideation. The patient stated that she had been depressed for the last 1 to 2 months. She complained of problems with depression, anxiety, intrusive thoughts, and physical symptoms. She stated that much of this started several months ago when her husband gave her an ultimatum of getting a job, or that she would have to be out of the relationship. She stated that she had suicidal thoughts of overdosing. She had seen her therapist on the date of admission and recommended going to the emergency department for evaluation. The patient stated she had been most recently hospitalized at a hospital in [place], [place]. She had been placed on Latuda, trazodone, BuSpar and Xanax. She stated that she had been on and off Xanax for many years. Sydney Taylor is seen, chart reviewed. The chart findings discussed with the treatment team. She is lying down in bed, but able to get up for this follow-up care assessment. She is still complaining of feeling dizzy. She says she feels her ankles are swollen. Assessment reveals slight puffiness to her left ankle, but no pitting or indentations. She is encouraged to get out of bed & walk around the unit as she is always in bed. She is reminded that she has Ativert 12.5 mg ordered for the dizziness. She stated yesterday that she has had dizzy episodes in the past, lasted few days & resolved without treatment. The nurse reports that  she has been coming out to take her medications & able to go to the cafeteria to get her meals. Sydney Taylor continues to endorse feeling depressed, denies suicidal ideations & hearing a voice, which she says was her own voice. She is taking & tolerating her treatment regimen. Denies any side effects. She denies any intent or plan to hurt herself here & able to verbally contract for safety. She rates her depression #8 & anxiety #7. She denies any VH, delusions or paranoia. She does not appear to be responding to any internal stimuli. Increased Luvox to 75 mg po daily.  Principal Problem: Major depressive disorder, single episode, severe (HCC)  Diagnosis: Principal Problem:   Major depressive disorder, single episode, severe (HCC) Active Problems:   Major depression  Total Time spent with patient: 25 minutes  Past Psychiatric History: See H&P  Past Medical History:  Past Medical History:  Diagnosis Date  . Anemia   . Anxiety   . Arrhythmia   . Bipolar depression ([place]) 12/28/2019  . Chronic headaches   . Depression   . Diabetes mellitus without complication ([place])   . Fibromyalgia   . GERD (gastroesophageal reflux disease)   . HLD (hyperlipidemia)   . Hypertension   . Insect bite   . Prediabetes   . Sleep apnea   . Urinary incontinence     Past Surgical History:  Procedure Laterality Date  . ESOPHAGOGASTRODUODENOSCOPY N/A 05/12/2020   Procedure: ESOPHAGOGASTRODUODENOSCOPY (EGD);  Surgeon: Daneil Dolin, MD;  Location: [facility]  ENDO SUITE;  Service: Endoscopy;  Laterality: N/A;  11:15am  . Joint fusion on foot Right   . MALONEY DILATION N/A 05/12/2020   Procedure: Venia Minks DILATION;  Surgeon: Daneil Dolin, MD;  Location: [facility] ENDO SUITE;  Service: Endoscopy;  Laterality: N/A;  . NASAL SEPTUM SURGERY     Family History:  Family History  Problem Relation Age of Onset  . Hypertension Father   . Hyperlipidemia Father   . Diabetes Father   . Hypertension Mother   . Hyperlipidemia Mother    . Diabetes Mellitus II Mother   . Congestive Heart Failure Mother   . Hypertension Sister   . Diabetes Sister   . Hypertension Brother   . Diabetes Maternal Grandmother   . Lupus Paternal Grandmother   . Colon cancer Neg Hx   . Esophageal cancer Neg Hx   . Gastric cancer Neg Hx    Family Psychiatric  History: See H&P  Social History:  Social History   Substance and Sexual Activity  Alcohol Use Not Currently  . Alcohol/week: 0.0 standard drinks     Social History   Substance and Sexual Activity  Drug Use No    Social History   Socioeconomic History  . Marital status: Married    Spouse name: Not on file  . Number of children: 0  . Years of education: Not on file  . Highest education level: Not on file  Occupational History  . Occupation: research associate/drug development  Tobacco Use  . Smoking status: Former Smoker    Types: Cigarettes    Quit date: 12/30/1982    Years since quitting: 37.5  . Smokeless tobacco: Never Used  Vaping Use  . Vaping Use: Never used  Substance and Sexual Activity  . Alcohol use: Not Currently    Alcohol/week: 0.0 standard drinks  . Drug use: No  . Sexual activity: Not Currently  Other Topics Concern  . Not on file  Social History Narrative   Married for 3 years.Lives with husband.Bachelors in Education officer, museum.   Social Determinants of Health   Financial Resource Strain:   . Difficulty of Paying Living Expenses:   Food Insecurity:   . Worried About Charity fundraiser in the Last Year:   . Arboriculturist in the Last Year:   Transportation Needs:   . Film/video editor (Medical):   Marland Kitchen Lack of Transportation (Non-Medical):   Physical Activity:   . Days of Exercise per Week:   . Minutes of Exercise per Session:   Stress:   . Feeling of Stress :   Social Connections:   . Frequency of Communication with Friends and Family:   . Frequency of Social Gatherings with Friends and Family:   . Attends Religious Services:   . Active  Member of Clubs or Organizations:   . Attends Archivist Meetings:   Marland Kitchen Marital Status:    Additional Social History:   Sleep: Good (Sleeping all morning).  Appetite:  Good  Current Medications: Current Facility-Administered Medications  Medication Dose Route Frequency Provider Last Rate Last Admin  . acetaminophen (TYLENOL) tablet 650 mg  650 mg Oral Q6H PRN Sharma Covert, MD      . alum & mag hydroxide-simeth (MAALOX/MYLANTA) 200-200-20 MG/5ML suspension 30 mL  30 mL Oral Q4H PRN Sharma Covert, MD      . chlordiazePOXIDE (LIBRIUM) capsule 25 mg  25 mg Oral TID PRN Sharma Covert, MD      .  chlorthalidone (HYGROTON) tablet 25 mg  25 mg Oral Daily Sharma Covert, MD   25 mg at 07/15/20 0926  . fluvoxaMINE (LUVOX) tablet 75 mg  75 mg Oral QHS Lindell Spar I, NP   75 mg at 07/14/20 2115  . gabapentin (NEURONTIN) capsule 200 mg  200 mg Oral TID Sharma Covert, MD   200 mg at 07/14/20 1736  . hydrOXYzine (ATARAX/VISTARIL) tablet 25 mg  25 mg Oral TID PRN Sharma Covert, MD   25 mg at 07/13/20 2125  . insulin aspart (novoLOG) injection 0-15 Units  0-15 Units Subcutaneous TID WC Sharma Covert, MD   3 Units at 07/15/20 [id number]  . losartan (COZAAR) tablet 25 mg  25 mg Oral Daily Sharma Covert, MD   25 mg at 07/15/20 5956  . magnesium hydroxide (MILK OF MAGNESIA) suspension 30 mL  30 mL Oral Daily PRN Sharma Covert, MD      . meclizine (ANTIVERT) tablet 12.5 mg  12.5 mg Oral BID Sharma Covert, MD   12.5 mg at 07/15/20 0927  . metoprolol tartrate (LOPRESSOR) tablet 50 mg  50 mg Oral BID Anike, Adaku C, NP   50 mg at 07/15/20 0927  . ondansetron (ZOFRAN) tablet 4 mg  4 mg Oral Q8H PRN Sharma Covert, MD   4 mg at 07/13/20 2125  . pantoprazole (PROTONIX) EC tablet 40 mg  40 mg Oral BID Sharma Covert, MD   40 mg at 07/15/20 0927  . potassium chloride SA (KLOR-CON) CR tablet 20 mEq  20 mEq Oral Daily Sharma Covert, MD   20 mEq at 07/15/20  0927  . traZODone (DESYREL) tablet 50 mg  50 mg Oral QHS PRN Sharma Covert, MD   50 mg at 07/14/20 2115    Lab Results:  Results for orders placed or performed during the hospital encounter of 07/12/20 (from the past 48 hour(s))  Glucose, capillary     Status: Abnormal   Collection Time: 07/13/20 11:57 AM  Result Value Ref Range   Glucose-Capillary 155 (H) 70 - 99 mg/dL    Comment: Glucose reference range applies only to samples taken after fasting for at least 8 hours.  Glucose, capillary     Status: Abnormal   Collection Time: 07/13/20  5:16 PM  Result Value Ref Range   Glucose-Capillary 110 (H) 70 - 99 mg/dL    Comment: Glucose reference range applies only to samples taken after fasting for at least 8 hours.   Comment 1 Notify RN    Comment 2 Document in Chart   Glucose, capillary     Status: Abnormal   Collection Time: 07/13/20  8:59 PM  Result Value Ref Range   Glucose-Capillary 179 (H) 70 - 99 mg/dL    Comment: Glucose reference range applies only to samples taken after fasting for at least 8 hours.   Comment 1 Notify RN    Comment 2 Document in Chart   Glucose, capillary     Status: Abnormal   Collection Time: 07/14/20  5:53 AM  Result Value Ref Range   Glucose-Capillary 129 (H) 70 - 99 mg/dL    Comment: Glucose reference range applies only to samples taken after fasting for at least 8 hours.   Comment 1 Notify RN    Comment 2 Document in Chart   Glucose, capillary     Status: Abnormal   Collection Time: 07/14/20 11:44 AM  Result Value Ref Range   Glucose-Capillary  224 (H) 70 - 99 mg/dL    Comment: Glucose reference range applies only to samples taken after fasting for at least 8 hours.  Glucose, capillary     Status: Abnormal   Collection Time: 07/14/20  4:27 PM  Result Value Ref Range   Glucose-Capillary 168 (H) 70 - 99 mg/dL    Comment: Glucose reference range applies only to samples taken after fasting for at least 8 hours.  Glucose, capillary     Status:  Abnormal   Collection Time: 07/14/20  9:13 PM  Result Value Ref Range   Glucose-Capillary 221 (H) 70 - 99 mg/dL    Comment: Glucose reference range applies only to samples taken after fasting for at least 8 hours.   Comment 1 Notify RN    Comment 2 Document in Chart   Glucose, capillary     Status: Abnormal   Collection Time: 07/15/20  6:05 AM  Result Value Ref Range   Glucose-Capillary 152 (H) 70 - 99 mg/dL    Comment: Glucose reference range applies only to samples taken after fasting for at least 8 hours.   Comment 1 Notify RN    Comment 2 Document in Chart    Blood Alcohol level:  Lab Results  Component Value Date   ETH <10 41/93/7902   Metabolic Disorder Labs: Lab Results  Component Value Date   HGBA1C 6.4 (H) 07/12/2020   MPG 136.98 07/12/2020   MPG 318 03/27/2020   No results found for: PROLACTIN Lab Results  Component Value Date   CHOL 208 (H) 01/21/2020   TRIG 208 (H) 01/21/2020   HDL 40 (L) 01/21/2020   CHOLHDL 5.2 (H) 01/21/2020   LDLCALC 133 (H) 01/21/2020   LDLCALC 122 08/26/2016   Physical Findings: AIMS: Facial and Oral Movements Muscles of Facial Expression: None, normal Lips and Perioral Area: None, normal Jaw: None, normal Tongue: None, normal,Extremity Movements Upper (arms, wrists, hands, fingers): None, normal Lower (legs, knees, ankles, toes): None, normal, Trunk Movements Neck, shoulders, hips: None, normal, Overall Severity Severity of abnormal movements (highest score from questions above): None, normal Incapacitation due to abnormal movements: None, normal Patient's awareness of abnormal movements (rate only patient's report): No Awareness, Dental Status Current problems with teeth and/or dentures?: No Does patient usually wear dentures?: No  CIWA:    COWS:     Musculoskeletal: Strength & Muscle Tone: within normal limits Gait & Station: normal Patient leans: N/A  Psychiatric Specialty Exam: Physical Exam Vitals and nursing note  reviewed.  HENT:     Head: Normocephalic.     Nose: Nose normal.     Mouth/Throat:     Pharynx: Oropharynx is clear.  Eyes:     Pupils: Pupils are equal, round, and reactive to light.  Cardiovascular:     Rate and Rhythm: Normal rate.     Pulses: Normal pulses.  Pulmonary:     Effort: Pulmonary effort is normal.  Genitourinary:    Comments: Deferred Musculoskeletal:        General: Normal range of motion.     Cervical back: Normal range of motion.  Skin:    General: Skin is warm and dry.  Neurological:     Mental Status: She is alert and oriented to person, place, and time.     Review of Systems  Constitutional: Negative for chills, diaphoresis and fever.  HENT: Negative for congestion, rhinorrhea, sneezing and sore throat.   Eyes: Negative for discharge.  Respiratory: Negative for chest tightness, shortness of  breath and wheezing.   Cardiovascular: Negative for chest pain and palpitations.  Gastrointestinal: Negative for diarrhea, nausea and vomiting.  Endocrine: Negative for cold intolerance.  Genitourinary: Negative for difficulty urinating.  Musculoskeletal: Negative for arthralgias and myalgias.  Allergic/Immunologic:       Allergies: Keflex  Neurological: Positive for dizziness (c/o), weakness (c/o) and light-headedness (c/o). Negative for tremors, seizures, syncope, facial asymmetry, speech difficulty, numbness and headaches.  Psychiatric/Behavioral: Positive for dysphoric mood and suicidal ideas. Negative for agitation, behavioral problems, confusion, decreased concentration, hallucinations, self-injury and sleep disturbance (Able to contract for safety, verbally). The patient is not nervous/anxious and is not hyperactive.     Blood pressure 122/75, pulse 84, temperature 98.7 F (37.1 C), temperature source Oral, resp. rate 16, height 5\' 10"  (1.778 m), weight 108 kg, SpO2 100 %.Body mass index is 34.15 kg/m.  General Appearance: Disheveled  Eye Contact:  Fair   Speech:  Slow  Volume:  Decreased  Mood:  Anxious and Depressed  Affect:  Congruent  Thought Process:  Coherent and Descriptions of Associations: Intact  Orientation:  Full (Time, Place, and Person)  Thought Content:  Rumination  Suicidal Thoughts:  Yes.  without intent/plan  Homicidal Thoughts:  No  Memory:  Immediate;   Fair Recent;   Fair Remote;   Fair  Judgement:  Impaired  Insight:  Lacking  Psychomotor Activity:  Decreased  Concentration:  Concentration: Fair and Attention Span: Fair  Recall:  [employer] of Knowledge:  Fair  Language:  Good  Akathisia:  Negative  Handed:  Right  AIMS (if indicated):     Assets:  Desire for Improvement Resilience  ADL's:  Intact  Cognition:  WNL    Sleep:  Number of Hours: 6.5   Treatment Plan Summary: Daily contact with patient to assess and evaluate symptoms and progress in treatment and Medication management.  - Continue inpatient hospitalization. - Will continue today 07/15/2020 plan as below except where it is noted.  Depression.    - Continue Luvox from 50 mg to 75 mg po daily.  Anxiety.    - Continue  Librium 25 mg po tid prn.    - Continue Vistaril 25 mg po tid prn.  Agitation/DM neuropathy.    - Continue gabapentin 200 mg po tid.  Insomnia.    - Continue Trazodone 50 mg po Q hs prn.  Other medical issues.    - Continue (chlorthalidone) [place] 25 mg po daily for HTN.    - Continue sliding scale insulin 0-15 units as recommended for high blood sugar levels.    - Continue losartan 25 mg po daily for HTN.    - Continue Zofran 4 mg po Q 8 hrs prn for n/v.    - Continue Protonix 40 mg po Q am for GERD.    - Continue Kdur 20 meq po for Potassium replacement.    - Continue Antivert 12.5 mg bid for dizziness.  Encourage out of bed to attend group sessions. Discharge disposition plan in progress.  Lindell Spar, NP, PMHNP, FNP-BC 07/15/2020, 11:27 AMPatient ID: [name], female   DOB: [birth year], 54 y.o.   MRN:  [id number]

## 2020-07-15 NOTE — [facility] Group Notes (Signed)
Adult Psychoeducational Group Note  Date:  07/15/2020 Time:  9:39 PM  Group Topic/Focus:  Wrap-Up Group:   The focus of this group is to help patients review their daily goal of treatment and discuss progress on daily workbooks.  Participation Level:  Active  Participation Quality:  Appropriate and Attentive  Affect:  Appropriate  Cognitive:  Alert and Appropriate  Insight: Appropriate and Good  Engagement in Group:  Engaged  Modes of Intervention:  Discussion and Education  Additional Comments:  Pt attended and participated in wrap up group this evening and rated their day a 4/10, due to them feeling anxious and depressed. Pt states they are not feeling well physically and they are experiencing some dizziness and that they spoke with the NP about it. Pt completed their goal, which was to not sleep as much, and to hang out with peers.   Cristi Loron 07/15/2020, 9:39 PM

## 2020-07-16 LAB — GLUCOSE, CAPILLARY
Glucose-Capillary: 127 mg/dL — ABNORMAL HIGH (ref 70–99)
Glucose-Capillary: 160 mg/dL — ABNORMAL HIGH (ref 70–99)
Glucose-Capillary: 128 mg/dL — ABNORMAL HIGH (ref 70–99)
Glucose-Capillary: 114 mg/dL — ABNORMAL HIGH (ref 70–99)

## 2020-07-16 MED ORDER — GLIPIZIDE 5 MG PO TABS
2.5000 mg | ORAL_TABLET | Freq: Two times a day (BID) | ORAL | Status: DC
  Administered 2020-07-16 – 2020-07-17 (×3): 2.5 mg via ORAL
  Filled 2020-07-16 (×5): qty 0.5
  Filled 2020-07-16: qty 1

## 2020-07-16 MED ORDER — GABAPENTIN 100 MG PO CAPS
200.0000 mg | ORAL_CAPSULE | Freq: Every day | ORAL | Status: DC
  Filled 2020-07-16: qty 2

## 2020-07-16 NOTE — Progress Notes (Signed)
[place] NOVEL CORONAVIRUS (COVID-19) DAILY CHECK-OFF SYMPTOMS - answer yes or no to each - every day NO YES  Have you had a fever in the past 24 hours?  . Fever (Temp > 37.80C / 100F) X   Have you had any of these symptoms in the past 24 hours? . New Cough .  Sore Throat  .  Shortness of Breath .  Difficulty Breathing .  Unexplained Body Aches   X   Have you had any one of these symptoms in the past 24 hours not related to allergies?   . Runny Nose .  Nasal Congestion .  Sneezing   X   If you have had runny nose, nasal congestion, sneezing in the past 24 hours, has it worsened?  X   EXPOSURES - check yes or no X   Have you traveled outside the state in the past 14 days?  X   Have you been in contact with someone with a confirmed diagnosis of COVID-19 or PUI in the past 14 days without wearing appropriate PPE?  X   Have you been living in the same home as a person with confirmed diagnosis of COVID-19 or a PUI (household contact)?    X   Have you been diagnosed with COVID-19?    X              What to do next: Answered NO to all: Answered YES to anything:   Proceed with unit schedule Follow the [facility] Inpatient Flowsheet.   Choua Chalker K. Mahathi Pokorney MSN, RN, [facility] [phone] 

## 2020-07-16 NOTE — Progress Notes (Signed)
[facility] Group Notes:  (Nursing/MHT/Case Management/Adjunct)  Date:  07/16/2020  Time:  2000 Type of Therapy:  wrap up group  Participation Level:  Active  Participation Quality:  Attentive and Sharing  Affect:  Flat  Cognitive:  Alert  Insight:  Lacking  Engagement in Group:  Engaged  Modes of Intervention:  Clarification, Education and Support  Summary of Progress/Problems: Positive thinking and self-care were discussed.   Shellia Cleverly 07/16/2020, 9:48 PM

## 2020-07-16 NOTE — Progress Notes (Signed)
  Pt is caucasian female  of 54 y.o. , DOB 1966-02-04, MRN  [id number]  presents Voluntary,  Hx of Diabetes,  HTN   Patient report fair appetite, energ normal, and fair sleeping pattern without mediaction. Pt rates  depression 7 out of 10, hopelessness 5 out of 10, and anxiety at 7 out of 10. Pt denies SI,  HI, or AVH.  Pt reports LBM yesterday, . Pain in back muscles reported at 6/10.  Pt states currents medications are making her too dizzy. Vitals signs  being monitored. Pt states  Tylenol not strong enough request Advil. medication given,  no adverse reaction observed, safety maintained with q15 minute checks.    Lolly Mustache. Bobby Rumpf MSN, [place], [facility] [phone]

## 2020-07-16 NOTE — [facility] Group Notes (Signed)
[facility] LCSW Group Therapy Note  Date/Time:  07/16/2020 9:00-10:00 or 10:00-11:00AM  Type of Therapy and Topic:  Group Therapy:  Healthy and Unhealthy Supports  Participation Level:  Active   Description of Group:  Patients in this group were introduced to the idea of adding a variety of healthy supports to address the various needs in their lives.Patients discussed what additional healthy supports could be helpful in their recovery and wellness after discharge in order to prevent future hospitalizations.   An emphasis was placed on using counselor, doctor, therapy groups, 12-step groups, and problem-specific support groups to expand supports.  The group really focused in large part on what has happened to patients when they have neglected themselves in favor of taking care of other people, and how that has not only NOT worked in the past, it will never be in their best interest to ignore their own needs. It was emphasized that, in fact, if a person ignores their own needs, they actually do not have the ability to care for anybody else at all in the long run.  This was discussed at length by various group members, and everyone listened attentively.  There were a lot of tears in group.  Therapeutic Goals:   1)  discuss importance of adding supports to stay well once out of the hospital  2)  compare healthy versus unhealthy supports and identify some examples of each  3)  generate ideas and descriptions of healthy supports that can be added  4)  offer mutual support about how to address unhealthy supports  5)  encourage active participation in and adherence to discharge plan    Summary of Patient Progress:  The patient expressed a willingness to add support(s) to help in her recovery journey.  She was late to group, but was very attentive and stated she agreed with everything that people were saying.   Therapeutic Modalities:   Motivational Interviewing Brief Solution-Focused  Therapy  Selmer Dominion, LCSW

## 2020-07-16 NOTE — Progress Notes (Signed)
DAR NOTE: Patient presents with anxious affect and depressed mood.   Denies pain, auditory and visual hallucinations. Pt complains of insomnia and anxiety. Pt medicated with Trazodone and Vistaril.  Maintained on routine safety checks. Support and encouragement offered as needed.  Attended group and participated. Will continue to monitor.

## 2020-07-16 NOTE — Progress Notes (Signed)
[facility] MD Progress Note  07/16/2020 1:50 PM Sydney Taylor  MRN:  [id number]  Subjective: Sydney Taylor reports, "I was not falling sleep in time last night, but I did ask for the sleep medicine & I got about 4-5 hrs of sleep. I'm still feeling very dizzy & light headed. I'm kind of anxious about going home because I'm not well yet. I'm still feeling suicidal & hearing my own voice telling me I worth nothing. I really do not like how the gabapentin is making me feel. It is making me too tired & sleepy during the day. I want the day & evening time doses stopped, leave the bedtime dose".  Objective: Patient is a 54 year old female who presented to the [facility] emergency department on 07/11/2020 with suicidal ideation. The patient stated that she had been depressed for the last 1 to 2 months. She complained of problems with depression, anxiety, intrusive thoughts, and physical symptoms. She stated that much of this started several months ago when her husband gave her an ultimatum of getting a job, or that she would have to be out of the relationship. She stated that she had suicidal thoughts of overdosing. She had seen her therapist on the date of admission and recommended going to the emergency department for evaluation. The patient stated she had been most recently hospitalized at a hospital in [place], [place]. She had been placed on Latuda, trazodone, BuSpar and Xanax. She stated that she had been on and off Xanax for many years. Sydney Taylor is seen, chart reviewed. The chart findings discussed with the treatment team. She is lying down in bed, but able to get up for this follow-up care assessment. She is still complaining of feeling dizzy. She says her ankles are not as puffy as they were yesterday. She was reminded yesterday that she has Antivert 12.5 mg ordered for the dizziness. She stated already that she has had dizzy episodes in the past, lasted few days & resolved without treatment. The nurse  reports that she has been coming out to take her medications & able to go to the cafeteria to get her meals. Sydney Taylor continues to endorse feeling depressed, is endorsing suicidal ideations & hearing a voice, which she says was her own voice. She is taking & tolerating her treatment regimen. Denies any side effects. She denies any intent or plan to hurt herself here & able to verbally contract for safety. She rates her depression #7 & anxiety #7 in the scale of 1-10. She denies any VH, delusions or paranoia. She does not appear to be responding to any internal stimuli. Her gabapentin 200 mg is now only at bedtime per request. Patient says she is very anxious about getting discharged because she is not feeling well yet. .  Principal Problem: Major depressive disorder, single episode, severe (HCC)  Diagnosis: Principal Problem:   Major depressive disorder, single episode, severe (HCC) Active Problems:   Major depression  Total Time spent with patient: 15 minutes  Past Psychiatric History: See H&P  Past Medical History:  Past Medical History:  Diagnosis Date  . Anemia   . Anxiety   . Arrhythmia   . Bipolar depression ([place]) 12/28/2019  . Chronic headaches   . Depression   . Diabetes mellitus without complication ([place])   . Fibromyalgia   . GERD (gastroesophageal reflux disease)   . HLD (hyperlipidemia)   . Hypertension   . Insect bite   . Prediabetes   . Sleep apnea   . Urinary incontinence  Past Surgical History:  Procedure Laterality Date  . ESOPHAGOGASTRODUODENOSCOPY N/A 05/12/2020   Procedure: ESOPHAGOGASTRODUODENOSCOPY (EGD);  Surgeon: Daneil Dolin, MD;  Location: [facility];  Service: Endoscopy;  Laterality: N/A;  11:15am  . Joint fusion on foot Right   . MALONEY DILATION N/A 05/12/2020   Procedure: Venia Minks DILATION;  Surgeon: Daneil Dolin, MD;  Location: [facility];  Service: Endoscopy;  Laterality: N/A;  . NASAL SEPTUM SURGERY     Family History:  Family  History  Problem Relation Age of Onset  . Hypertension Father   . Hyperlipidemia Father   . Diabetes Father   . Hypertension Mother   . Hyperlipidemia Mother   . Diabetes Mellitus II Mother   . Congestive Heart Failure Mother   . Hypertension Sister   . Diabetes Sister   . Hypertension Brother   . Diabetes Maternal Grandmother   . Lupus Paternal Grandmother   . Colon cancer Neg Hx   . Esophageal cancer Neg Hx   . Gastric cancer Neg Hx    Family Psychiatric  History: See H&P  Social History:  Social History   Substance and Sexual Activity  Alcohol Use Not Currently  . Alcohol/week: 0.0 standard drinks     Social History   Substance and Sexual Activity  Drug Use No    Social History   Socioeconomic History  . Marital status: Married    Spouse name: Not on file  . Number of children: 0  . Years of education: Not on file  . Highest education level: Not on file  Occupational History  . Occupation: research associate/drug development  Tobacco Use  . Smoking status: Former Smoker    Types: Cigarettes    Quit date: 12/30/1982    Years since quitting: 37.5  . Smokeless tobacco: Never Used  Vaping Use  . Vaping Use: Never used  Substance and Sexual Activity  . Alcohol use: Not Currently    Alcohol/week: 0.0 standard drinks  . Drug use: No  . Sexual activity: Not Currently  Other Topics Concern  . Not on file  Social History Narrative   Married for 3 years.Lives with husband.Bachelors in Education officer, museum.   Social Determinants of Health   Financial Resource Strain:   . Difficulty of Paying Living Expenses:   Food Insecurity:   . Worried About Charity fundraiser in the Last Year:   . Arboriculturist in the Last Year:   Transportation Needs:   . Film/video editor (Medical):   Marland Kitchen Lack of Transportation (Non-Medical):   Physical Activity:   . Days of Exercise per Week:   . Minutes of Exercise per Session:   Stress:   . Feeling of Stress :   Social Connections:    . Frequency of Communication with Friends and Family:   . Frequency of Social Gatherings with Friends and Family:   . Attends Religious Services:   . Active Member of Clubs or Organizations:   . Attends Archivist Meetings:   Marland Kitchen Marital Status:    Additional Social History:   Sleep: Good (Sleeping all morning).  Appetite:  Good  Current Medications: Current Facility-Administered Medications  Medication Dose Route Frequency Provider Last Rate Last Admin  . acetaminophen (TYLENOL) tablet 650 mg  650 mg Oral Q6H PRN Sharma Covert, MD      . alum & mag hydroxide-simeth (MAALOX/MYLANTA) 200-200-20 MG/5ML suspension 30 mL  30 mL Oral Q4H PRN Sharma Covert, MD      .  chlordiazePOXIDE (LIBRIUM) capsule 25 mg  25 mg Oral TID PRN Sharma Covert, MD      . chlorthalidone (HYGROTON) tablet 25 mg  25 mg Oral Daily Sharma Covert, MD   25 mg at 07/16/20 0840  . fluvoxaMINE (LUVOX) tablet 75 mg  75 mg Oral QHS Lindell Spar I, NP   75 mg at 07/15/20 2136  . gabapentin (NEURONTIN) capsule 200 mg  200 mg Oral TID Sharma Covert, MD   200 mg at 07/16/20 0824  . glipiZIDE (GLUCOTROL) tablet 2.5 mg  2.5 mg Oral BID AC Sharma Covert, MD   2.5 mg at 07/16/20 4920  . hydrOXYzine (ATARAX/VISTARIL) tablet 25 mg  25 mg Oral TID PRN Sharma Covert, MD   25 mg at 07/15/20 2137  . insulin aspart (novoLOG) injection 0-15 Units  0-15 Units Subcutaneous TID WC Sharma Covert, MD   3 Units at 07/16/20 1224  . losartan (COZAAR) tablet 25 mg  25 mg Oral Daily Sharma Covert, MD   25 mg at 07/15/20 1007  . magnesium hydroxide (MILK OF MAGNESIA) suspension 30 mL  30 mL Oral Daily PRN Sharma Covert, MD      . meclizine (ANTIVERT) tablet 12.5 mg  12.5 mg Oral BID Sharma Covert, MD   12.5 mg at 07/16/20 0849  . metoprolol tartrate (LOPRESSOR) tablet 50 mg  50 mg Oral BID Anike, Adaku C, NP   50 mg at 07/16/20 0849  . ondansetron (ZOFRAN) tablet 4 mg  4 mg Oral Q8H PRN  Sharma Covert, MD   4 mg at 07/13/20 2125  . pantoprazole (PROTONIX) EC tablet 40 mg  40 mg Oral BID Sharma Covert, MD   40 mg at 07/16/20 0824  . potassium chloride SA (KLOR-CON) CR tablet 20 mEq  20 mEq Oral Daily Sharma Covert, MD   20 mEq at 07/16/20 0824  . traZODone (DESYREL) tablet 50 mg  50 mg Oral QHS PRN,MR X 1 Rozetta Nunnery, NP       Lab Results:  Results for orders placed or performed during the hospital encounter of 07/12/20 (from the past 48 hour(s))  Glucose, capillary     Status: Abnormal   Collection Time: 07/14/20  4:27 PM  Result Value Ref Range   Glucose-Capillary 168 (H) 70 - 99 mg/dL    Comment: Glucose reference range applies only to samples taken after fasting for at least 8 hours.  Glucose, capillary     Status: Abnormal   Collection Time: 07/14/20  9:13 PM  Result Value Ref Range   Glucose-Capillary 221 (H) 70 - 99 mg/dL    Comment: Glucose reference range applies only to samples taken after fasting for at least 8 hours.   Comment 1 Notify RN    Comment 2 Document in Chart   Glucose, capillary     Status: Abnormal   Collection Time: 07/15/20  6:05 AM  Result Value Ref Range   Glucose-Capillary 152 (H) 70 - 99 mg/dL    Comment: Glucose reference range applies only to samples taken after fasting for at least 8 hours.   Comment 1 Notify RN    Comment 2 Document in Chart   Glucose, capillary     Status: Abnormal   Collection Time: 07/15/20 11:55 AM  Result Value Ref Range   Glucose-Capillary 171 (H) 70 - 99 mg/dL    Comment: Glucose reference range applies only to samples taken after fasting for  at least 8 hours.  Glucose, capillary     Status: Abnormal   Collection Time: 07/15/20  4:54 PM  Result Value Ref Range   Glucose-Capillary 137 (H) 70 - 99 mg/dL    Comment: Glucose reference range applies only to samples taken after fasting for at least 8 hours.  Glucose, capillary     Status: Abnormal   Collection Time: 07/15/20  8:03 PM  Result  Value Ref Range   Glucose-Capillary 157 (H) 70 - 99 mg/dL    Comment: Glucose reference range applies only to samples taken after fasting for at least 8 hours.   Comment 1 Notify RN    Comment 2 Document in Chart   Glucose, capillary     Status: Abnormal   Collection Time: 07/16/20  6:00 AM  Result Value Ref Range   Glucose-Capillary 127 (H) 70 - 99 mg/dL    Comment: Glucose reference range applies only to samples taken after fasting for at least 8 hours.  Glucose, capillary     Status: Abnormal   Collection Time: 07/16/20 11:52 AM  Result Value Ref Range   Glucose-Capillary 160 (H) 70 - 99 mg/dL    Comment: Glucose reference range applies only to samples taken after fasting for at least 8 hours.   Comment 1 Notify RN    Comment 2 Document in Chart    Blood Alcohol level:  Lab Results  Component Value Date   ETH <10 18/56/3149   Metabolic Disorder Labs: Lab Results  Component Value Date   HGBA1C 6.4 (H) 07/12/2020   MPG 136.98 07/12/2020   MPG 318 03/27/2020   No results found for: PROLACTIN Lab Results  Component Value Date   CHOL 208 (H) 01/21/2020   TRIG 208 (H) 01/21/2020   HDL 40 (L) 01/21/2020   CHOLHDL 5.2 (H) 01/21/2020   LDLCALC 133 (H) 01/21/2020   LDLCALC 122 08/26/2016   Physical Findings: AIMS: Facial and Oral Movements Muscles of Facial Expression: None, normal Lips and Perioral Area: None, normal Jaw: None, normal Tongue: None, normal,Extremity Movements Upper (arms, wrists, hands, fingers): None, normal Lower (legs, knees, ankles, toes): None, normal, Trunk Movements Neck, shoulders, hips: None, normal, Overall Severity Severity of abnormal movements (highest score from questions above): None, normal Incapacitation due to abnormal movements: None, normal Patient's awareness of abnormal movements (rate only patient's report): No Awareness, Dental Status Current problems with teeth and/or dentures?: No Does patient usually wear dentures?: No  CIWA:     COWS:     Musculoskeletal: Strength & Muscle Tone: within normal limits Gait & Station: normal Patient leans: N/A  Psychiatric Specialty Exam: Physical Exam Vitals and nursing note reviewed.  HENT:     Head: Normocephalic.     Nose: Nose normal.     Mouth/Throat:     Pharynx: Oropharynx is clear.  Eyes:     Pupils: Pupils are equal, round, and reactive to light.  Cardiovascular:     Rate and Rhythm: Normal rate.     Pulses: Normal pulses.  Pulmonary:     Effort: Pulmonary effort is normal.  Genitourinary:    Comments: Deferred Musculoskeletal:        General: Normal range of motion.     Cervical back: Normal range of motion.  Skin:    General: Skin is warm and dry.  Neurological:     Mental Status: She is alert and oriented to person, place, and time.     Review of Systems  Constitutional: Negative for  chills, diaphoresis and fever.  HENT: Negative for congestion, rhinorrhea, sneezing and sore throat.   Eyes: Negative for discharge.  Respiratory: Negative for chest tightness, shortness of breath and wheezing.   Cardiovascular: Negative for chest pain and palpitations.  Gastrointestinal: Negative for diarrhea, nausea and vomiting.  Endocrine: Negative for cold intolerance.  Genitourinary: Negative for difficulty urinating.  Musculoskeletal: Negative for arthralgias and myalgias.  Allergic/Immunologic:       Allergies: Keflex  Neurological: Positive for dizziness (c/o), weakness (c/o) and light-headedness (c/o). Negative for tremors, seizures, syncope, facial asymmetry, speech difficulty, numbness and headaches.  Psychiatric/Behavioral: Positive for dysphoric mood and suicidal ideas. Negative for agitation, behavioral problems, confusion, decreased concentration, hallucinations, self-injury and sleep disturbance (Able to contract for safety, verbally). The patient is not nervous/anxious and is not hyperactive.     Blood pressure 105/70, pulse 85, temperature 98.6 F  (37 C), temperature source Oral, resp. rate 16, height 5\' 10"  (1.778 m), weight 108 kg, SpO2 100 %.Body mass index is 34.15 kg/m.  General Appearance: Disheveled  Eye Contact:  Fair  Speech:  Slow  Volume:  Decreased  Mood:  Anxious and Depressed  Affect:  Congruent  Thought Process:  Coherent and Descriptions of Associations: Intact  Orientation:  Full (Time, Place, and Person)  Thought Content:  Rumination  Suicidal Thoughts:  Yes.  without intent/plan  Homicidal Thoughts:  No  Memory:  Immediate;   Fair Recent;   Fair Remote;   Fair  Judgement:  Impaired  Insight:  Lacking  Psychomotor Activity:  Decreased  Concentration:  Concentration: Fair and Attention Span: Fair  Recall:  [employer] of Knowledge:  Fair  Language:  Good  Akathisia:  Negative  Handed:  Right  AIMS (if indicated):     Assets:  Desire for Improvement Resilience  ADL's:  Intact  Cognition:  WNL    Sleep:  Number of Hours: 5.75   Treatment Plan Summary: Daily contact with patient to assess and evaluate symptoms and progress in treatment and Medication management.  - Continue inpatient hospitalization. - Will continue today 07/16/2020 plan as below except where it is noted.  Depression.    - Continue Luvox 75 mg po daily.  Anxiety.    - Continue  Librium 25 mg po tid prn.    - Continue Vistaril 25 mg po tid prn.  Agitation/DM neuropathy.    - Discontinue gabapentin 200 mg po tid per pt's request.    - Start Gabapentin 200 mg po Q hs per patient's request.  Insomnia.    - Continue Trazodone 50 mg po Q hs prn.  Other medical issues.    - Continue (chlorthalidone) [place] 25 mg po daily for HTN.    - Continue sliding scale insulin 0-15 units as recommended for high blood sugar levels.    - Continue losartan 25 mg po daily for HTN.    - Continue Zofran 4 mg po Q 8 hrs prn for n/v.    - Continue Protonix 40 mg po Q am for GERD.    - Continue Kdur 20 meq po for Potassium replacement.    -  Continue Antivert 12.5 mg bid for dizziness.  Encourage out of bed to attend group sessions. Discharge disposition plan in progress.  Lindell Spar, NP, PMHNP, FNP-BC 07/16/2020, 1:50 PMPatient ID: [name], female   DOB: 19-Nov-1966, 54 y.o.   MRN: [id number] Patient ID: Mylynn Dinh, female   DOB: 03/19/1966, 54 y.o.   MRN: [id number]

## 2020-07-17 ENCOUNTER — Ambulatory Visit (HOSPITAL_COMMUNITY): Payer: No Typology Code available for payment source

## 2020-07-17 LAB — GLUCOSE, CAPILLARY
Glucose-Capillary: 138 mg/dL — ABNORMAL HIGH (ref 70–99)
Glucose-Capillary: 160 mg/dL — ABNORMAL HIGH (ref 70–99)

## 2020-07-17 MED ORDER — GLIPIZIDE 5 MG PO TABS: 2.5000 mg | ORAL_TABLET | Freq: Two times a day (BID) | ORAL | 0 refills | Status: DC

## 2020-07-17 MED ORDER — TRAZODONE HCL 50 MG PO TABS: 50.0000 mg | ORAL_TABLET | Freq: Every evening | ORAL | 0 refills | Status: DC | PRN

## 2020-07-17 MED ORDER — MECLIZINE HCL 12.5 MG PO TABS: 12.5000 mg | ORAL_TABLET | Freq: Two times a day (BID) | ORAL | 0 refills | Status: DC

## 2020-07-17 MED ORDER — IBUPROFEN 400 MG PO TABS: 400.0000 mg | ORAL_TABLET | Freq: Four times a day (QID) | ORAL | Status: DC | PRN

## 2020-07-17 MED ORDER — FLUVOXAMINE MALEATE 100 MG PO TABS
100.0000 mg | ORAL_TABLET | Freq: Every day | ORAL | Status: DC
  Filled 2020-07-17: qty 1

## 2020-07-17 MED ORDER — FLUVOXAMINE MALEATE 100 MG PO TABS: 100.0000 mg | ORAL_TABLET | Freq: Every day | ORAL | 0 refills | Status: DC

## 2020-07-17 MED ORDER — LOSARTAN POTASSIUM 25 MG PO TABS: 25.0000 mg | ORAL_TABLET | Freq: Every day | ORAL | 0 refills | Status: DC

## 2020-07-17 MED ORDER — GABAPENTIN 100 MG PO CAPS: 200.0000 mg | ORAL_CAPSULE | Freq: Every day | ORAL | 0 refills | Status: DC

## 2020-07-17 MED ORDER — POTASSIUM CHLORIDE CRYS ER 20 MEQ PO TBCR: 20.0000 meq | EXTENDED_RELEASE_TABLET | Freq: Every day | ORAL | 0 refills | Status: DC

## 2020-07-17 MED ORDER — HYDROXYZINE HCL 25 MG PO TABS: 25.0000 mg | ORAL_TABLET | Freq: Three times a day (TID) | ORAL | 0 refills | Status: DC | PRN

## 2020-07-17 MED ORDER — METOPROLOL TARTRATE 50 MG PO TABS: 50.0000 mg | ORAL_TABLET | Freq: Two times a day (BID) | ORAL | 0 refills | Status: DC

## 2020-07-17 MED ORDER — PANTOPRAZOLE SODIUM 40 MG PO TBEC: 40.0000 mg | DELAYED_RELEASE_TABLET | Freq: Two times a day (BID) | ORAL | 0 refills | Status: DC

## 2020-07-17 NOTE — Progress Notes (Signed)
RN met with pt and reviewed pt's discharge instructions.  Pt verbalized understanding of discharge instructions and pt did not have any questions. RN reviewed and provided pt with a copy of SRA, AVS and Transition Record.  RN returned pt's belongings to pt.  Pt denied SI/HI/AVH and voiced no concerns.  Pt was appreciative of the care pt received at [facility].  Patient discharged to the lobby where family member was waiting to take pt home.

## 2020-07-17 NOTE — Discharge Summary (Signed)
Physician Discharge Summary Note  Patient:  Sydney Taylor is an 54 y.o., female MRN:  [id number] DOB:  [birth year] Patient phone:  [phone] (home)  Patient address:   [address] [place] [address],  Total Time spent with patient: 15 minutes  Date of Admission:  07/12/2020 Date of Discharge: 07/18/20  Reason for Admission:  suicidal ideation  Principal Problem: Major depressive disorder, single episode, severe ([place]) Discharge Diagnoses: Principal Problem:   Major depressive disorder, single episode, severe ([place]) Active Problems:   Major depression   Past Psychiatric History: Patient stated she had only had 2 psychiatric hospitalizations during her lifetime.  This hospitalization and no one in [place], [place] last month or so.  She has been treated with multiple medications in the past.  Please see subjective data for that information.  Past Medical History:  Past Medical History:  Diagnosis Date  . Anemia   . Anxiety   . Arrhythmia   . Bipolar depression ([place]) 12/28/2019  . Chronic headaches   . Depression   . Diabetes mellitus without complication ([place])   . Fibromyalgia   . GERD (gastroesophageal reflux disease)   . HLD (hyperlipidemia)   . Hypertension   . Insect bite   . Prediabetes   . Sleep apnea   . Urinary incontinence     Past Surgical History:  Procedure Laterality Date  . ESOPHAGOGASTRODUODENOSCOPY N/A 05/12/2020   Procedure: ESOPHAGOGASTRODUODENOSCOPY (EGD);  Surgeon: Daneil Dolin, MD;  Location[facility];  Service: Endoscopy;  Laterality: N/A;  11:15am  . Joint fusion on foot Right   . MALONEY DILATION N/A 05/12/2020   Procedure: Venia Minks DILATION;  Surgeon: Daneil Dolin, MD;  Location: [facility];  Service: Endoscopy;  Laterality: N/A;  . NASAL SEPTUM SURGERY     Family History:  Family History  Problem Relation Age of Onset  . Hypertension Father   . Hyperlipidemia Father   . Diabetes Father   . Hypertension Mother   .  Hyperlipidemia Mother   . Diabetes Mellitus II Mother   . Congestive Heart Failure Mother   . Hypertension Sister   . Diabetes Sister   . Hypertension Brother   . Diabetes Maternal Grandmother   . Lupus Paternal Grandmother   . Colon cancer Neg Hx   . Esophageal cancer Neg Hx   . Gastric cancer Neg Hx    Family Psychiatric  History: Patient reported multiple family members with psychiatric illness as well as a completed suicide. Social History:  Social History   Substance and Sexual Activity  Alcohol Use Not Currently  . Alcohol/week: 0.0 standard drinks     Social History   Substance and Sexual Activity  Drug Use No    Social History   Socioeconomic History  . Marital status: Married    Spouse name: Not on file  . Number of children: 0  . Years of education: Not on file  . Highest education level: Not on file  Occupational History  . Occupation: research associate/drug development  Tobacco Use  . Smoking status: Former Smoker    Types: Cigarettes    Quit date: 12/30/1982    Years since quitting: 37.5  . Smokeless tobacco: Never Used  Vaping Use  . Vaping Use: Never used  Substance and Sexual Activity  . Alcohol use: Not Currently    Alcohol/week: 0.0 standard drinks  . Drug use: No  . Sexual activity: Not Currently  Other Topics Concern  . Not on file  Social  History Narrative   Married for 3 years.Lives with husband.Bachelors in Education officer, museum.   Social Determinants of Health   Financial Resource Strain:   . Difficulty of Paying Living Expenses:   Food Insecurity:   . Worried About Charity fundraiser in the Last Year:   . Arboriculturist in the Last Year:   Transportation Needs:   . Film/video editor (Medical):   Marland Kitchen Lack of Transportation (Non-Medical):   Physical Activity:   . Days of Exercise per Week:   . Minutes of Exercise per Session:   Stress:   . Feeling of Stress :   Social Connections:   . Frequency of Communication with Friends and Family:    . Frequency of Social Gatherings with Friends and Family:   . Attends Religious Services:   . Active Member of Clubs or Organizations:   . Attends Archivist Meetings:   Marland Kitchen Marital Status:     Hospital Course:  From admission H&P: Patient is a 54 year old female who presented to the [facility] emergency department on 07/11/2020 with suicidal ideation. The patient stated that she had been depressed for the last 1 to 2 months. She complained of problems with depression, anxiety, intrusive thoughts, and physical symptoms. She stated that much of this started several months ago when her husband gave her an ultimatum of getting a job, or that she would have to be out of the relationship. She stated that she had suicidal thoughts of overdosing. She had seen her therapist on the date of admission and recommended going to the emergency department for evaluation. The patient stated she had been most recently hospitalized at a hospital in [place], [place]. She had been placed on Latuda, trazodone, BuSpar and Xanax. She stated that she had been on and off Xanax for many years. She stated she had taken multiple medications in the past including lithium, Lamictal and many other medicines that she was unable to recall. Review of the electronic medical record also revealed that she had been previously treated with amitriptyline, Rexulti.She was very somatic throughout the interview. She is worried about her dizziness, and multiple other physical complaints. Review of the electronic medical record revealed complaints of abdominal pain, nausea, body aches, dysphagia, tick bite, dizziness, unstable gait. She has been diagnosed with reflux disease and had been controlled on AcipHex. She had had a colonoscopy done that was apparently negative. She had an EGD done on 05/12/2020. It does appear that she had a Maloney dilatation done on her esophagus on 05/12/2020. A prescription for Nexium was sent in  on 05/12/2020. She contacted her gastroenterologist on 5/17 stating that she was having trouble swallowing her pills again. She was referred for speech pathology because of her dysphagia. Physical therapy assessment revealed that she had globus sensation with all solids. She apparently had had a tick bite in early in June 2021 and reported to the emergency department with body aches. She had already been prescribed doxycycline for this. She again presented to her outpatient nurse practitioner with abdominal pain. This was continued from previous experience. She apparently had been placed on Protonix 40 mg p.o. twice daily. There are multiple visits for orthopedic surgeons as far back as 2014 at the [facility] for midfoot arthritis and arthrodesis. Unfortunately none of these notes appear to have any of her psychiatric medications present. She was admitted to the hospital for evaluation and stabilization.  Ms. Savannah was admitted for depression with suicidal ideation. She remained  on the [facility] unit for six days. She was started on Luvox and gabapentin. She participated in group therapy on the unit. She responded well to treatment with no adverse effects reported. She has shown improved mood, affect, sleep, and interaction. She denies any SI/HI/AVH and contracts for safety. She is discharging on the medications listed below. She agrees to follow up at [facility] (see below). Patient is provided with prescriptions for medications upon discharge. Her husband is picking her up for discharge home.   Physical Findings: AIMS: Facial and Oral Movements Muscles of Facial Expression: None, normal Lips and Perioral Area: None, normal Jaw: None, normal Tongue: None, normal,Extremity Movements Upper (arms, wrists, hands, fingers): None, normal Lower (legs, knees, ankles, toes): None, normal, Trunk Movements Neck, shoulders, hips: None, normal,  Overall Severity Severity of abnormal movements (highest score from questions above): None, normal Incapacitation due to abnormal movements: None, normal Patient's awareness of abnormal movements (rate only patient's report): No Awareness, Dental Status Current problems with teeth and/or dentures?: No Does patient usually wear dentures?: No  CIWA:    COWS:     Musculoskeletal: Strength & Muscle Tone: within normal limits Gait & Station: normal Patient leans: N/A  Psychiatric Specialty Exam: Physical Exam Vitals and nursing note reviewed.  Constitutional:      Appearance: She is well-developed.  Cardiovascular:     Rate and Rhythm: Normal rate.  Pulmonary:     Effort: Pulmonary effort is normal.  Neurological:     Mental Status: She is alert and oriented to person, place, and time.     Review of Systems  Constitutional: Negative.   Respiratory: Negative for cough and shortness of breath.   Psychiatric/Behavioral: Negative for agitation, behavioral problems, confusion, dysphoric mood, hallucinations, self-injury, sleep disturbance and suicidal ideas. The patient is not nervous/anxious and is not hyperactive.     Blood pressure 109/71, pulse 78, temperature 98.5 F (36.9 C), temperature source Oral, resp. rate 18, height 5\' 10"  (1.778 m), weight 108 kg, SpO2 96 %.Body mass index is 34.15 kg/m.  See MD's discharge SRA      Has this patient used any form of tobacco in the last 30 days? (Cigarettes, Smokeless Tobacco, Cigars, and/or Pipes)  No  Blood Alcohol level:  Lab Results  Component Value Date   ETH <10 11/57/2620    Metabolic Disorder Labs:  Lab Results  Component Value Date   HGBA1C 6.4 (H) 07/12/2020   MPG 136.98 07/12/2020   MPG 318 03/27/2020   No results found for: PROLACTIN Lab Results  Component Value Date   CHOL 208 (H) 01/21/2020   TRIG 208 (H) 01/21/2020   HDL 40 (L) 01/21/2020   CHOLHDL 5.2 (H) 01/21/2020   LDLCALC 133 (H) 01/21/2020   [place]  122 08/26/2016    See Psychiatric Specialty Exam and Suicide Risk Assessment completed by Attending Physician prior to discharge.  Discharge destination:  Home  Is patient on multiple antipsychotic therapies at discharge:  No   Has Patient had three or more failed trials of antipsychotic monotherapy by history:  No  Recommended Plan for Multiple Antipsychotic Therapies: NA  Discharge Instructions    Discharge instructions   Complete by: As directed    Patient is instructed to take all prescribed medications as recommended. Report any side effects or adverse reactions to your outpatient psychiatrist. Patient is instructed to abstain from alcohol and illegal drugs while on prescription medications. In the event of worsening symptoms, patient  is instructed to call the crisis hotline, 911, or go to the nearest emergency department for evaluation and treatment.     Allergies as of 07/17/2020      Reactions   Keflex [cephalexin] Itching, Swelling      Medication List    STOP taking these medications   busPIRone 5 MG tablet Commonly known as: BUSPAR   estradiol 0.1 MG/GM vaginal cream Commonly known as: ESTRACE   Latuda 40 MG Tabs tablet Generic drug: lurasidone   lidocaine 2 % solution Commonly known as: XYLOCAINE   potassium chloride 20 MEQ/15ML (10%) Soln   Vitamin D-3 125 MCG (5000 UT) Tabs   Xanax 0.25 MG tablet Generic drug: ALPRAZolam     TAKE these medications     Indication  chlorthalidone 25 MG tablet Commonly known as: HYGROTON Take 1 tablet (25 mg total) by mouth daily.  Indication: Edema   fluvoxaMINE 100 MG tablet Commonly known as: LUVOX Take 1 tablet (100 mg total) by mouth at bedtime.  Indication: Major Depressive Disorder   gabapentin 100 MG capsule Commonly known as: NEURONTIN Take 2 capsules (200 mg total) by mouth at bedtime.  Indication: Neuropathic Pain, Agitation   glipiZIDE 5 MG tablet Commonly known as: GLUCOTROL Take 0.5 tablets  (2.5 mg total) by mouth 2 (two) times daily before a meal. What changed:   medication strength  how much to take  Indication: Type 2 Diabetes   hydrOXYzine 25 MG tablet Commonly known as: ATARAX/VISTARIL Take 1 tablet (25 mg total) by mouth 3 (three) times daily as needed for anxiety.  Indication: Feeling Anxious   losartan 25 MG tablet Commonly known as: COZAAR Take 1 tablet (25 mg total) by mouth daily.  Indication: High Blood Pressure Disorder   meclizine 12.5 MG tablet Commonly known as: ANTIVERT Take 1 tablet (12.5 mg total) by mouth 2 (two) times daily.  Indication: Sensation of Spinning or Whirling   metoprolol tartrate 50 MG tablet Commonly known as: LOPRESSOR Take 1 tablet (50 mg total) by mouth 2 (two) times daily.  Indication: High Blood Pressure Disorder   pantoprazole 40 MG tablet Commonly known as: PROTONIX Take 1 tablet (40 mg total) by mouth 2 (two) times daily. What changed: when to take this  Indication: Gastroesophageal Reflux Disease   potassium chloride SA 20 MEQ tablet Commonly known as: KLOR-CON Take 1 tablet (20 mEq total) by mouth daily.  Indication: Low Amount of Potassium in the Blood   traZODone 50 MG tablet Commonly known as: DESYREL Take 1 tablet (50 mg total) by mouth at bedtime as needed and may repeat dose one time if needed for sleep. What changed:   when to take this  reasons to take this  Indication: [place] on 07/20/2020.   Why: You have an appointment on 07/20/20 at 11:00 am for medication management services.  This appointment will be held in person. Contact information: [place] [address] [place] [facility] Follow up.   Why: Please follow up with your provider at discharge to schedule an appointment for therapy services.   Contact information: [address] [place]  [address]  P: [phone] F: [phone]              Follow-up recommendations: Activity as tolerated. Diet as recommended by primary care physician. Keep all scheduled follow-up  appointments as recommended.  Comments:   Patient is instructed to take all prescribed medications as recommended. Report any side effects or adverse reactions to your outpatient psychiatrist. Patient is instructed to abstain from alcohol and illegal drugs while on prescription medications. In the event of worsening symptoms, patient is instructed to call the crisis hotline, 911, or go to the nearest emergency department for evaluation and treatment.  Signed: Connye Burkitt, NP 07/18/2020, 8:07 AM

## 2020-07-17 NOTE — Progress Notes (Signed)
Spiritual care group on grief and loss facilitated by chaplain Jerene Pitch  Group Goal:  Support / Education around grief and loss  Members engage in facilitated group support and psycho-social education.  Group Description:  Following introductions and group rules, group members engaged in facilitated group dialog and support around topic of loss, with particular support around experiences of loss in their lives. Group Identified types of loss (relationships / self / things) and identified patterns, circumstances, and changes that precipitate losses. Reflected on thoughts / feelings around loss, normalized grief responses, and recognized variety in grief experience.  Patient Progress: Present at beginning of group.  Left group after group introductions and did not return.

## 2020-07-17 NOTE — BHH Suicide Risk Assessment (Signed)
[facility] Discharge Suicide Risk Assessment   Principal Problem: Major depressive disorder, single episode, severe ([place]) Discharge Diagnoses: Principal Problem:   Major depressive disorder, single episode, severe ([place]) Active Problems:   Major depression   Total Time spent with patient: 20 minutes  Musculoskeletal: Strength & Muscle Tone: within normal limits Gait & Station: normal Patient leans: N/A  Psychiatric Specialty Exam: Review of Systems  Musculoskeletal: Positive for arthralgias, joint swelling and myalgias.  Psychiatric/Behavioral: Positive for dysphoric mood.  All other systems reviewed and are negative.   Blood pressure 109/71, pulse 78, temperature 98.5 F (36.9 C), temperature source Oral, resp. rate 18, height 5\' 10"  (1.778 m), weight 108 kg, SpO2 96 %.Body mass index is 34.15 kg/m.  General Appearance: Casual  Eye Contact::  Fair  Speech:  Normal Rate409  Volume:  Normal  Mood:  Anxious  Affect:  Congruent  Thought Process:  Coherent and Descriptions of Associations: Intact  Orientation:  Full (Time, Place, and Person)  Thought Content:  Rumination  Suicidal Thoughts:  No  Homicidal Thoughts:  No  Memory:  Immediate;   Fair Recent;   Fair Remote;   Fair  Judgement:  Intact  Insight:  Fair  Psychomotor Activity:  Decreased  Concentration:  Fair  Recall:  [employer] of Knowledge:Fair  Language: Good  Akathisia:  Negative  Handed:  Right  AIMS (if indicated):     Assets:  Desire for Improvement Housing Resilience  Sleep:  Number of Hours: 5.25  Cognition: WNL  ADL's:  Intact   Mental Status Per Nursing Assessment::   On Admission:  Suicidal ideation indicated by patient, Plan includes specific time, place, or method, Intention to act on suicide plan, Self-harm thoughts, Belief that plan would result in death, Suicide plan  Demographic Factors:  Caucasian, Low socioeconomic status and Unemployed  Loss Factors: Decrease in vocational  status  Historical Factors: Impulsivity  Risk Reduction Factors:   Living with another person, especially a relative  Continued Clinical Symptoms:  Severe Anxiety and/or Agitation Depression:   Impulsivity Obsessive-Compulsive Disorder  Cognitive Features That Contribute To Risk:  Thought constriction (tunnel vision)    Suicide Risk:  Minimal: No identifiable suicidal ideation.  Patients presenting with no risk factors but with morbid ruminations; may be classified as minimal risk based on the severity of the depressive symptoms   Follow-up [facility] Follow up.   Why: Please follow up with your provider at discharge to schedule an appointment for medication management services.   Contact information: [place] [address] [place] [facility] Follow up.   Why: Please follow up with your provider at discharge to schedule an appointment for therapy services.   Contact information: [address] [place] [address]  P: [phone] F: [phone]              Plan Of Care/Follow-up recommendations:  Activity:  ad lib  Sharma Covert, MD 07/17/2020, 9:28 AM

## 2020-07-17 NOTE — Progress Notes (Signed)
  [facility] Adult Case Management Discharge Plan :  Will you be returning to the same living situation after discharge:  Yes,  home with husband At discharge, do you have transportation home?: Yes,  husband Do you have the ability to pay for your medications: Yes,  [employer]  Release of information consent forms completed and in the chart;  Patient's signature needed at discharge.  Patient to Follow up at:  [facility] on 07/20/2020.   Why: You have an appointment on 07/20/20 at 11:00 am for medication management services.  This appointment will be held in person. Contact information: [place] [address] [place] [facility] Follow up.   Why: Please follow up with your provider at discharge to schedule an appointment for therapy services.   Contact information: [address] [place] [address]  P: [phone] F: [phone]              Next level of care provider has access to [place] and Suicide Prevention discussed: Yes,  yes     Has patient been referred to the Quitline?: Patient refused referral  Patient has been referred for addiction treatment: [place], [place] 07/17/2020, 11:12 AM

## 2020-07-17 NOTE — Progress Notes (Signed)
Recreation Therapy Notes  Date:  7.19.20 Time: 0945 Location: [address] Group Room  Group Topic: Stress Management  Goal Area(s) Addresses:  Patient will identify positive stress management techniques. Patient will identify benefits of using stress management post d/c.  Intervention: Stress Management   Activity : Meditation.  LRT played a meditation that focused on being resilient in the face of adversity.  Patients were to listen to meditation as it played to engage in activity.   Education:  Stress Management, Discharge Planning.   Education Outcome: Acknowledges Education  Clinical Observations/Feedback: Pt did not attend group session.    Victorino Sparrow, LRT/CTRS         Ria Comment, Kamauri Denardo A 07/17/2020 11:25 AM

## 2020-07-18 NOTE — BHH Counselor (Signed)
CSW received call from pt informing that her psychiatrist needs progress notes. CSW faxes progress notes to [facility].

## 2020-07-19 ENCOUNTER — Ambulatory Visit: Payer: No Typology Code available for payment source | Admitting: Nurse Practitioner

## 2020-07-20 ENCOUNTER — Emergency Department (HOSPITAL_COMMUNITY)
Admission: EM | Admit: 2020-07-20 | Discharge: 2020-07-20 | Disposition: A | Discharge status: Home / Self Care | Payer: No Typology Code available for payment source | Attending: Emergency Medicine | Admitting: Emergency Medicine

## 2020-07-20 ENCOUNTER — Encounter (HOSPITAL_COMMUNITY): Payer: Self-pay

## 2020-07-20 ENCOUNTER — Encounter (INDEPENDENT_AMBULATORY_CARE_PROVIDER_SITE_OTHER): Payer: Self-pay | Admitting: Nurse Practitioner

## 2020-07-20 ENCOUNTER — Other Ambulatory Visit: Payer: Self-pay

## 2020-07-20 ENCOUNTER — Telehealth (INDEPENDENT_AMBULATORY_CARE_PROVIDER_SITE_OTHER): Payer: Self-pay | Admitting: Nurse Practitioner

## 2020-07-20 ENCOUNTER — Ambulatory Visit (INDEPENDENT_AMBULATORY_CARE_PROVIDER_SITE_OTHER): Payer: No Typology Code available for payment source | Admitting: Nurse Practitioner

## 2020-07-20 VITALS — BP 134/80 | HR 100 | Resp 16 | Ht 70.0 in | Wt 236.6 lb

## 2020-07-20 DIAGNOSIS — I10 Essential (primary) hypertension: Secondary | ICD-10-CM | POA: Insufficient documentation

## 2020-07-20 DIAGNOSIS — F32A Depression, unspecified: Secondary | ICD-10-CM

## 2020-07-20 DIAGNOSIS — R45851 Suicidal ideations: Secondary | ICD-10-CM | POA: Diagnosis not present

## 2020-07-20 DIAGNOSIS — E119 Type 2 diabetes mellitus without complications: Secondary | ICD-10-CM | POA: Insufficient documentation

## 2020-07-20 DIAGNOSIS — R202 Paresthesia of skin: Secondary | ICD-10-CM

## 2020-07-20 DIAGNOSIS — Z79899 Other long term (current) drug therapy: Secondary | ICD-10-CM | POA: Insufficient documentation

## 2020-07-20 DIAGNOSIS — F329 Major depressive disorder, single episode, unspecified: Secondary | ICD-10-CM | POA: Diagnosis not present

## 2020-07-20 DIAGNOSIS — Z87891 Personal history of nicotine dependence: Secondary | ICD-10-CM | POA: Diagnosis not present

## 2020-07-20 DIAGNOSIS — Z7984 Long term (current) use of oral hypoglycemic drugs: Secondary | ICD-10-CM | POA: Diagnosis not present

## 2020-07-20 DIAGNOSIS — R42 Dizziness and giddiness: Secondary | ICD-10-CM

## 2020-07-20 NOTE — Progress Notes (Signed)
Subjective:  Patient ID: [name], female    DOB: 1966/09/05  Age: 54 y.o. MRN: [id number]  CC:  Chief Complaint  Patient presents with  . Dizziness    pt also c/o blurry vision and unsteady on her feet has had episodes in the past but this time has been going on for about 2 weeks      HPI  This patient arrives today with complaints for the above.  She tells me that the dizziness has been going on for approximately 2 weeks since been constant.  Only changes that seem to have occurred over the last couple weeks is she was started on fluvoxamine by her psychiatrist.  I do see that this can cause dizziness.  She does not feel the dizziness is coming from this medication as she has been having the symptoms on and off before.  She tells me that she is not experiencing any vertigo she has not lost consciousness.  She has not fallen.  She does have a past medical history significant for diabetes, hypertension, and depression.  During the course of her visit PHQ-9 was completed and her score was 21.  She did vocalize suicidal ideation and she did vocalize that she does have a plan, when I did try to obtain a verbal contract that she would not commit suicide her response was fairly vague and at 1 point she did tell me that she would not hurt herself, however she also mentioned that she cannot promise that it would never happen.  Eye contact was fair during this discussion.  She did tell me that she saw her psychiatrist earlier today and did voice his concerns with her psychiatrist, I do not see any notes from her psychiatrist.  She believes that her psychiatrist is outside of the [facility].   Past Medical History:  Diagnosis Date  . Anemia   . Anxiety   . Arrhythmia   . Bipolar depression ([place]) 12/28/2019  . Chronic headaches   . Depression   . Diabetes mellitus without complication ([place])   . Fibromyalgia   . GERD (gastroesophageal reflux disease)   . HLD (hyperlipidemia)    . Hypertension   . Insect bite   . Prediabetes   . Sleep apnea   . Urinary incontinence       Family History  Problem Relation Age of Onset  . Hypertension Father   . Hyperlipidemia Father   . Diabetes Father   . Hypertension Mother   . Hyperlipidemia Mother   . Diabetes Mellitus II Mother   . Congestive Heart Failure Mother   . Hypertension Sister   . Diabetes Sister   . Hypertension Brother   . Diabetes Maternal Grandmother   . Lupus Paternal Grandmother   . Colon cancer Neg Hx   . Esophageal cancer Neg Hx   . Gastric cancer Neg Hx     Social History   Social History Narrative   Married for 3 years.Lives with husband.Bachelors in Education officer, [employer].   Social History   Tobacco Use  . Smoking status: Former Smoker    Types: Cigarettes    Quit date: 12/30/1982    Years since quitting: 37.5  . Smokeless tobacco: Never Used  Substance Use Topics  . Alcohol use: Not Currently    Alcohol/week: 0.0 standard drinks     Current Meds  Medication Sig  . ALPRAZolam (XANAX) 0.5 MG tablet Take 0.5 mg by mouth daily as needed.  . chlorthalidone (HYGROTON)  25 MG tablet Take 1 tablet (25 mg total) by mouth daily.  . fluvoxaMINE (LUVOX) 100 MG tablet Take 1 tablet (100 mg total) by mouth at bedtime.  . gabapentin (NEURONTIN) 100 MG capsule Take 2 capsules (200 mg total) by mouth at bedtime.  Marland Kitchen glipiZIDE (GLUCOTROL) 5 MG tablet Take 0.5 tablets (2.5 mg total) by mouth 2 (two) times daily before a meal.  . hydrOXYzine (ATARAX/VISTARIL) 25 MG tablet Take 1 tablet (25 mg total) by mouth 3 (three) times daily as needed for anxiety.  Marland Kitchen losartan (COZAAR) 25 MG tablet Take 1 tablet (25 mg total) by mouth daily.  . meclizine (ANTIVERT) 12.5 MG tablet Take 1 tablet (12.5 mg total) by mouth 2 (two) times daily.  . metoprolol tartrate (LOPRESSOR) 50 MG tablet Take 1 tablet (50 mg total) by mouth 2 (two) times daily.  . pantoprazole (PROTONIX) 40 MG tablet Take 1 tablet (40 mg total) by mouth 2  (two) times daily.  . potassium chloride SA (KLOR-CON) 20 MEQ tablet Take 1 tablet (20 mEq total) by mouth daily.  . traZODone (DESYREL) 50 MG tablet Take 1 tablet (50 mg total) by mouth at bedtime as needed and may repeat dose one time if needed for sleep.    ROS:  Review of Systems  Constitutional: Positive for malaise/fatigue. Negative for fever.  HENT: Negative for ear discharge, ear pain, hearing loss and tinnitus.   Eyes: Positive for blurred vision. Negative for pain.  Respiratory: Negative for shortness of breath.   Cardiovascular: Negative for chest pain and palpitations.  Gastrointestinal: Negative for abdominal pain and blood in stool.  Neurological: Positive for dizziness and headaches. Negative for loss of consciousness.  Psychiatric/Behavioral: Positive for depression and suicidal ideas.     Objective:   Today's Vitals: BP (!) 134/80   Pulse 100   Resp 16   Ht 5' 10"  (1.778 m)   Wt (!) 236 lb 9.6 oz (107.3 kg)   LMP  (LMP Unknown)   SpO2 95%   BMI 33.95 kg/m  Vitals with BMI 07/20/2020 07/11/2020 07/11/2020  Height 5' 10"  - -  Weight 236 lbs 10 oz - -  BMI 55.73 - -  Systolic 220 254 270  Diastolic 80 66 59  Pulse 623 81 83  Some encounter information is confidential and restricted. Go to Review Flowsheets activity to see all data.     Physical Exam Vitals reviewed.  Constitutional:      General: She is not in acute distress.    Appearance: Normal appearance.  HENT:     Head: Normocephalic and atraumatic.  Neck:     Vascular: No carotid bruit.  Cardiovascular:     Rate and Rhythm: Normal rate and regular rhythm.     Pulses: Normal pulses.     Heart sounds: Normal heart sounds.  Pulmonary:     Effort: Pulmonary effort is normal.     Breath sounds: Normal breath sounds.  Skin:    General: Skin is warm and dry.  Neurological:     General: No focal deficit present.     Mental Status: She is alert and oriented to person, place, and time.     Cranial  Nerves: Cranial nerves are intact.     Sensory: Sensation is intact.     Motor: Motor function is intact.     Coordination: Coordination is intact.     Gait: Gait is intact.     Deep Tendon Reflexes:     Reflex Scores:  Brachioradialis reflexes are 2+ on the right side and 2+ on the left side.      Patellar reflexes are 2+ on the right side and 2+ on the left side. Psychiatric:        Attention and Perception: Attention normal.        Mood and Affect: Mood normal. Affect is blunt.        Speech: Speech normal.        Behavior: Behavior is slowed. Behavior is not aggressive. Behavior is cooperative.        Thought Content: Thought content includes suicidal ideation. Thought content includes suicidal plan.        Judgment: Judgment normal.     Orthostatic VS: sitting: 124/96, standing at 0 minutes 122/86, standing at 3 minutes 124/88   EKG: Sinus rhythm  PHQ9 SCORE ONLY 07/20/2020 04/06/2020 03/27/2020  PHQ-9 Total Score 21 0 0     Assessment and Plan   1. Suicidal ideation   2. Dizziness   3. Paresthesias      Plan: 1.  It is my belief that she should be emergently evaluated by psychiatry due to her current suicidal ideation.  I am concerned that she may be a danger to herself because she only vaguely provided a verbal contract that she would not commit suicide, and based on by language I do not feel comfortable letting her go home.  I did discuss this with her, and she did reluctantly agreed to go to the emergency department for psychiatric evaluation.  911 was called and they did dispatch a police officer that is escorting her voluntarily to the emergency department.  I did call social work over at [facility] to make sure there was no additional interventions are needed to occur in the outpatient setting, and I also called the charge nurse over at [facility] to notify her that the patient would be on their way.  The patient did request that we call her husband and let him know,  and so my staff will be calling him to inform him. I did consult with my supervising physican (Dr. Anastasio Champion, and he is aware of the above plan).   2.-3.  I did order blood work today, however due to being transported to the emergency department for psychiatric evaluation I do not believe the blood work was completed.  I will request that my staff call her on Monday to have her come in to complete blood work if she is discharged from the hospital.  She is very concerned about abnormalities in her brain and she would like imaging completed, thus I will order MRI of the brain.  EKG appeared normal, however I will refer her for cardiac echocardiogram and to cardiology for further evaluation of her dizziness.  I did discuss that her fluvoxamine could potentially be causing her dizziness, and that next time she speaks to her psychiatrist she should discuss this to determine if maybe they need to either reduce the dose or change to a different agent.   Tests ordered Orders Placed This Encounter  Procedures  . MR Brain Wo Contrast  . CBC with Differential/Platelets  . CMP with eGFR([facility])  . TSH  . T3, Free  . T4, Free  . Vitamin B12  . Ambulatory referral to Cardiology  . EKG 12-Lead  . ECHOCARDIOGRAM COMPLETE      No orders of the defined types were placed in this encounter.   Patient to follow-up next week for blood work,  and then for office visit in approximately 1 month. I spent 45 minutes dedicated to the care of this patient on the date of this encounter which includes a combination of either face-to-face or virtual contact with the patient, ordering of tests and/or procedures, and consultation with supervising physician as well as social work.    Ailene Ards, NP

## 2020-07-20 NOTE — Patient Instructions (Signed)
If you are experiencing a mental health crisis please call 911 or [phone].

## 2020-07-20 NOTE — ED Notes (Signed)
Pt. Dressed out in purple scrubs.

## 2020-07-20 NOTE — ED Provider Notes (Signed)
[facility] EMERGENCY DEPARTMENT Provider Note   CSN: [id number] Arrival date & time: 07/20/20  1454     History Chief Complaint  Patient presents with  . V70.1    Sydney Taylor is a 54 y.o. female.  HPI    15yF here "because I chose the wrong words." Was at PCP's office for appointment and they were asking her how she was feeling. She was there for an unrelated reason but she talked about feeling depressed and feeling suicidal. She was subsequently brought to the emergency room. She has a history of depression and just recently had a behavioral health admission. She says she feels about the same as she did when she was discharged. Has had these thoughts for at least several months. Denies acute change.   Past Medical History:  Diagnosis Date  . Anemia   . Anxiety   . Arrhythmia   . Bipolar depression ([place]) 12/28/2019  . Chronic headaches   . Depression   . Diabetes mellitus without complication ([place])   . Fibromyalgia   . GERD (gastroesophageal reflux disease)   . HLD (hyperlipidemia)   . Hypertension   . Insect bite   . Prediabetes   . Sleep apnea   . Urinary incontinence     Patient Active Problem List   Diagnosis Date Noted  . Major depressive disorder, single episode, severe ([place]) 07/14/2020  . Major depression 07/12/2020  . Dysphagia 05/11/2020  . GERD (gastroesophageal reflux disease) 05/11/2020  . Type 2 diabetes mellitus ([place]) 03/27/2020  . Bipolar depression ([place]) 12/28/2019  . Sleep apnea 12/28/2019  . Acute lower UTI 10/31/2019  . Hypokalemia 10/31/2019  . Tachycardia 10/31/2019  . Urinary tract infection without hematuria   . Hyperlipidemia 10/30/2015  . Obesity due to excess calories 10/30/2015    Past Surgical History:  Procedure Laterality Date  . ESOPHAGOGASTRODUODENOSCOPY N/A 05/12/2020   Procedure: ESOPHAGOGASTRODUODENOSCOPY (EGD);  Surgeon: Daneil Dolin, MD;  Location: [facility] ENDO SUITE;  Service: Endoscopy;  Laterality: N/A;  11:15am  .  Joint fusion on foot Right   . MALONEY DILATION N/A 05/12/2020   Procedure: Venia Minks DILATION;  Surgeon: Daneil Dolin, MD;  Location: [facility] ENDO SUITE;  Service: Endoscopy;  Laterality: N/A;  . NASAL SEPTUM SURGERY       OB History    Gravida      Para      Term      Preterm      AB      Living  0     SAB      TAB      Ectopic      Multiple      Live Births              Family History  Problem Relation Age of Onset  . Hypertension Father   . Hyperlipidemia Father   . Diabetes Father   . Hypertension Mother   . Hyperlipidemia Mother   . Diabetes Mellitus II Mother   . Congestive Heart Failure Mother   . Hypertension Sister   . Diabetes Sister   . Hypertension Brother   . Diabetes Maternal Grandmother   . Lupus Paternal Grandmother   . Colon cancer Neg Hx   . Esophageal cancer Neg Hx   . Gastric cancer Neg Hx     Social History   Tobacco Use  . Smoking status: Former Smoker    Types: Cigarettes    Quit date: 12/30/1982    Years since  quitting: 37.5  . Smokeless tobacco: Never Used  Vaping Use  . Vaping Use: Never used  Substance Use Topics  . Alcohol use: Not Currently    Alcohol/week: 0.0 standard drinks  . Drug use: No    Home Medications Prior to Admission medications   Medication Sig Start Date End Date Taking? Authorizing Provider  ALPRAZolam Duanne Moron) 0.5 MG tablet Take 0.5 mg by mouth daily as needed. 07/05/20   [provider]  chlorthalidone (HYGROTON) 25 MG tablet Take 1 tablet (25 mg total) by mouth daily. 06/19/20   Ailene Ards, NP  fluvoxaMINE (LUVOX) 100 MG tablet Take 1 tablet (100 mg total) by mouth at bedtime. 07/17/20   Connye Burkitt, NP  gabapentin (NEURONTIN) 100 MG capsule Take 2 capsules (200 mg total) by mouth at bedtime. 07/17/20   Connye Burkitt, NP  glipiZIDE (GLUCOTROL) 5 MG tablet Take 0.5 tablets (2.5 mg total) by mouth 2 (two) times daily before a meal. 07/17/20   Connye Burkitt, NP  hydrOXYzine  (ATARAX/VISTARIL) 25 MG tablet Take 1 tablet (25 mg total) by mouth 3 (three) times daily as needed for anxiety. 07/17/20   Connye Burkitt, NP  losartan (COZAAR) 25 MG tablet Take 1 tablet (25 mg total) by mouth daily. 07/17/20   Connye Burkitt, NP  meclizine (ANTIVERT) 12.5 MG tablet Take 1 tablet (12.5 mg total) by mouth 2 (two) times daily. 07/17/20   Connye Burkitt, NP  metoprolol tartrate (LOPRESSOR) 50 MG tablet Take 1 tablet (50 mg total) by mouth 2 (two) times daily. 07/17/20   Connye Burkitt, NP  pantoprazole (PROTONIX) 40 MG tablet Take 1 tablet (40 mg total) by mouth 2 (two) times daily. 07/17/20   Connye Burkitt, NP  potassium chloride SA (KLOR-CON) 20 MEQ tablet Take 1 tablet (20 mEq total) by mouth daily. 07/18/20   Connye Burkitt, NP  traZODone (DESYREL) 50 MG tablet Take 1 tablet (50 mg total) by mouth at bedtime as needed and may repeat dose one time if needed for sleep. 07/17/20   Connye Burkitt, NP  chlorthalidone (HYGROTON) 50 MG tablet Take 0.5 tablets (25 mg total) by mouth daily. 05/03/20   Ailene Ards, NP  promethazine (PHENERGAN) 25 MG tablet Take 1 tablet (25 mg total) by mouth every 12 (twelve) hours as needed for nausea or vomiting. 03/17/20 03/17/20  [place], [place], PA-C    Allergies    Keflex [cephalexin]  Review of Systems   Review of Systems All systems reviewed and negative, other than as noted in HPI.  Physical Exam Updated Vital Signs Ht 5\' 10"  (1.778 m)   Wt (!) 106.6 kg   LMP  (LMP Unknown)   BMI 33.72 kg/m   Physical Exam Vitals and nursing note reviewed.  Constitutional:      General: She is not in acute distress.    Appearance: She is well-developed.  HENT:     Head: Normocephalic and atraumatic.  Eyes:     General:        Right eye: No discharge.        Left eye: No discharge.     Conjunctiva/sclera: Conjunctivae normal.  Cardiovascular:     Rate and Rhythm: Normal rate and regular rhythm.     Heart sounds: Normal heart sounds. No murmur heard.   No friction rub. No gallop.   Pulmonary:     Effort: Pulmonary effort is normal. No respiratory distress.     Breath  sounds: Normal breath sounds.  Abdominal:     General: There is no distension.     Palpations: Abdomen is soft.     Tenderness: There is no abdominal tenderness.  Musculoskeletal:        General: No tenderness.     Cervical back: Neck supple.  Skin:    General: Skin is warm and dry.  Neurological:     Mental Status: She is alert.  Psychiatric:     Comments: Flat affect. Fair eye contact. Speech clear. Content appropriate. Decent insight.      ED Results / Procedures / Treatments   Labs (all labs ordered are listed, but only abnormal results are displayed) Labs Reviewed - No data to display  EKG None  Radiology No results found.  Procedures Procedures (including critical care time)  Medications Ordered in ED Medications - No data to display  ED Course  I have reviewed the triage vital signs and the nursing notes.  Pertinent labs & imaging results that were available during my care of the patient were reviewed by me and considered in my medical decision making (see chart for details).    MDM Rules/Calculators/A&P                          65yF with depression and SI. She has had SI for months and discharged from behavioral health reasons within the past week. She does not feel like her symptoms have significantly worsened since that time. She was at her PCP for an unrelated reason when this came up. She reports she had a telephone conversation with a psychiatric nurse practitioner earlier today and discussed with them the thoughts she has been having. She does not feel that she would actually act on her thoughts. She says if her thoughts worsened she would call her psychiatric nurse practitioner or go to the emergency room.   Final Clinical Impression(s) / ED Diagnoses Final diagnoses:  Depression, unspecified depression type  Suicidal thoughts     Rx / DC Orders ED Discharge Orders    None       Virgel Manifold, MD 07/23/20 2308

## 2020-07-20 NOTE — Discharge Instructions (Addendum)
Follow-up with your psychiatry nurse practitioner. If you thoughts worsen and you feel like you may harm yourself then call 911.

## 2020-07-20 NOTE — ED Triage Notes (Signed)
Pt sent by NP at Dr. Nicanor Alcon office.  Reports pt has SI with a plan.  Pt arrived and reports has been thinking about overdosing but says she would never really go through with it.

## 2020-07-20 NOTE — Telephone Encounter (Signed)
I ordered MRI of the brain without contrast at her last office visit.  Please ensure that this is scheduled if insurance approves.  Thank you.

## 2020-07-25 ENCOUNTER — Ambulatory Visit (HOSPITAL_COMMUNITY): Payer: No Typology Code available for payment source

## 2020-07-25 ENCOUNTER — Telehealth (HOSPITAL_COMMUNITY): Payer: Self-pay | Admitting: Oncology

## 2020-07-25 NOTE — Telephone Encounter (Signed)
Waiting for insurance approval.

## 2020-07-26 NOTE — Progress Notes (Signed)
Virtual Visit via Video Note   This visit type was conducted due to national recommendations for restrictions regarding the COVID-19 Pandemic (e.g. social distancing) in an effort to limit this patient's exposure and mitigate transmission in our community.  Due to her co-morbid illnesses, this patient is at least at moderate risk for complications without adequate follow up.  This format is felt to be most appropriate for this patient at this time.  All issues noted in this document were discussed and addressed.  A limited physical exam was performed with this format.  Please refer to the patient's chart for her consent to telehealth for [name].  Date:  07/27/2020   ID:  Sydney Taylor, DOB Jun 01, 1966, MRN [id number] The patient was identified using 2 identifiers.  Patient Location: Home Provider Location: Office/Clinic  PCP:  Doree Albee, MD  Cardiologist:  Dorris Carnes, MD  Electrophysiologist:  None   Evaluation Performed:  Follow-Up Visit  Chief Complaint: Dizziness  History of Present Illness:    Sydney Taylor is a 54 y.o. female with past medical history of HTN, Type 2 DM, GERD and Bipolar Disorder who presents to the office today for 27-month follow-up.  She was last examined by Dr. Harrington Challenger in 12/2019 as a new patient referral for tachycardia and dizziness. She reported palpitations which typically would occur with exertion and she denied any syncopal episodes. It was thought that her episodes of tachycardia were secondary to her hydration status and physiologic stressors as her heart rate was normal during her office visit.  She was evaluated at [facility] ED earlier this month for suicidal ideations. By review of notes, she reported she did not feel like she would act on her thoughts and she was discharged home with outpatient psychiatry follow-up recommended.  In talking with the patient today, she reports her palpitations have resolved since her last office  visit. She did increase her fluid intake and consumes over 8+ glasses of water on a daily basis. She continues to have some intermittent dizziness which is not associated with positional changes. Symptoms typically occur throughout the day.  She does report some episodes of blurred vision along with worsening balance but this can occur with or without her dizzy spells.  She denies any recent chest pain, orthopnea, PND or presyncope. She does report occasional swelling along her ankles and elevates her legs during the day. She does not consume fast food regularly but does consume prepackaged meals. Has compression stockings which she wears when traveling for long distances but does not typically wear these on a daily basis.   Past Medical History:  Diagnosis Date  . Anemia   . Anxiety   . Arrhythmia   . Bipolar depression ([place]) 12/28/2019  . Chronic headaches   . Depression   . Diabetes mellitus without complication ([place])   . Fibromyalgia   . GERD (gastroesophageal reflux disease)   . HLD (hyperlipidemia)   . Hypertension   . Insect bite   . Prediabetes   . Sleep apnea   . Urinary incontinence    Past Surgical History:  Procedure Laterality Date  . ESOPHAGOGASTRODUODENOSCOPY N/A 05/12/2020   Procedure: ESOPHAGOGASTRODUODENOSCOPY (EGD);  Surgeon: Daneil Dolin, MD;  [facility];  Service: Endoscopy;  Laterality: N/A;  11:15am  . Joint fusion on foot Right   . MALONEY DILATION N/A 05/12/2020   Procedure: Venia Minks DILATION;  Surgeon: Daneil Dolin, MD;  Location: [facility] SU[facility];  Service: Endoscopy;  Laterality: N/A;  . NASAL SEPTUM SURGERY       Current Meds  Medication Sig  . ALPRAZolam (XANAX) 0.5 MG tablet Take 0.5 mg by mouth daily as needed.  . chlorthalidone (HYGROTON) 25 MG tablet Take 1 tablet (25 mg total) by mouth daily.  Marland Kitchen glipiZIDE (GLUCOTROL) 10 MG tablet Take 10 mg by mouth 2 (two) times daily before a meal.  . hydrOXYzine (ATARAX/VISTARIL) 25 MG tablet  Take 1 tablet (25 mg total) by mouth 3 (three) times daily as needed for anxiety.  Marland Kitchen KLOR-CON M20 20 MEQ tablet TAKE 2 TABLETS BY MOUTH IN THE MORNING AND 1 TABLET BY MOUTH IN THE EVENING  . losartan (COZAAR) 25 MG tablet Take 1 tablet (25 mg total) by mouth daily.  . metoprolol tartrate (LOPRESSOR) 50 MG tablet Take 1 tablet (50 mg total) by mouth 2 (two) times daily.  . pantoprazole (PROTONIX) 40 MG tablet Take 1 tablet (40 mg total) by mouth 2 (two) times daily.  . sertraline (ZOLOFT) 25 MG tablet Take by mouth.  . traZODone (DESYREL) 50 MG tablet Take 1 tablet (50 mg total) by mouth at bedtime as needed and may repeat dose one time if needed for sleep.     Allergies:   Keflex [cephalexin]   Social History   Tobacco Use  . Smoking status: Former Smoker    Types: Cigarettes    Quit date: 12/30/1982    Years since quitting: 37.6  . Smokeless tobacco: Never Used  Vaping Use  . Vaping Use: Never used  Substance Use Topics  . Alcohol use: Not Currently    Alcohol/week: 0.0 standard drinks  . Drug use: No     Family Hx: The patient's family history includes Congestive Heart Failure in her mother; Diabetes in her father, maternal grandmother, and sister; Diabetes Mellitus II in her mother; Hyperlipidemia in her father and mother; Hypertension in her brother, father, mother, and sister; Lupus in her paternal grandmother. There is no history of Colon cancer, Esophageal cancer, or Gastric cancer.  ROS:   Please see the history of present illness.     All other systems reviewed and are negative.   Prior CV studies:   The following studies were reviewed today:   CTA: 10/2019 FINDINGS: Cardiovascular: There is no cardiomegaly or pericardial effusion. The thoracic aorta is unremarkable. The origins of the great vessels of the aortic arch appear patent as visualized. Evaluation of the pulmonary arteries is somewhat limited due to suboptimal opacification and timing of the contrast. No  definite large or central pulmonary artery embolus identified.  Mediastinum/Nodes: There is no hilar or mediastinal adenopathy. The esophagus and the thyroid gland are grossly unremarkable. No mediastinal fluid collection.  Lungs/Pleura: Minimal bibasilar atelectasis. No focal consolidation, pleural effusion, pneumothorax. The central airways are patent.  Upper Abdomen: Fatty liver. The visualized upper abdomen is otherwise unremarkable.  Musculoskeletal: Degenerative changes of the spine. No acute osseous pathology.  Review of the MIP images confirms the above findings.  IMPRESSION: No acute intrathoracic pathology. No CT evidence of central pulmonary artery embolus.   Labs/Other Tests and Data Reviewed:    EKG:  An ECG dated 07/11/2020 was personally reviewed today and demonstrated:  NSR, HR 81 with no acute ST abnormalities when compared to prior tracings.   Recent Labs: 01/21/2020: TSH 2.21 07/11/2020: Hemoglobin 13.8; Platelets 320 07/12/2020: ALT 25; BUN 8; Creatinine, Ser 0.85; Potassium 3.5; Sodium 133   Recent Lipid Panel Lab Results  Component Value Date/Time  CHOL 208 (H) 01/21/2020 10:09 AM   TRIG 208 (H) 01/21/2020 10:09 AM   HDL 40 (L) 01/21/2020 10:09 AM   CHOLHDL 5.2 (H) 01/21/2020 10:09 AM   LDLCALC 133 (H) 01/21/2020 10:09 AM    Wt Readings from Last 3 Encounters:  07/27/20 (!) 235 lb (106.6 kg)  07/20/20 (!) 235 lb (106.6 kg)  07/20/20 (!) 236 lb 9.6 oz (107.3 kg)     Objective:    Vital Signs:  BP (!) 129/76   Pulse 72   Ht 5\' 10"  (1.778 m)   Wt (!) 235 lb (106.6 kg)   LMP  (LMP Unknown)   BMI 33.72 kg/m    General: Pleasant female appearing in NAD Psych: Normal affect. Neuro: Alert and oriented X 3. Moves all extremities spontaneously. Lungs:  Respirations appear regular and unlabored by video assessment.  ASSESSMENT & PLAN:    1. Dizziness - She describes intermittent episodes of dizziness which can occur throughout the day  and she is unaware of any specific triggers. She reports BP was soft when she was evaluated in the ED earlier this month but this has been well controlled since returning home. Her dizziness is not associated with positional changes which makes orthostatic hypotension less likely but is worse throughout the day. She does take her Lopressor, Losartan and Chlorthalidone in the morning hours. I recommended that she try taking her Losartan in the evening in an effort to divide out her medications to see if this helps with symptoms. She is already scheduled for an echocardiogram next week to assess for any structural cardiac abnormalities.  2.  Palpitations - She denies any recurrent symptoms and says that her previous palpitations resolved with increasing hydration. No indication for further cardiac monitoring at this time. Her recent EKG was reviewed and shows normal sinus rhythm as outlined above.  3. HTN - BP was well controlled at 129/76 when checked earlier today. Continue current medication regimen with Chlorthalidone 25 mg daily, Losartan 25 mg daily and Lopressor 50 mg twice daily.  I did recommend that she take her Losartan during the evening hours as outlined above to see if this helps with her dizziness.  4. Lower Extremity Edema - She does report intermittent swelling along her ankles and lower legs bilaterally. Denies any associated respiratory symptoms as outlined above. She does have compression stockings at home and I encouraged her to utilize these to see if this helps with symptoms. She is already on Chlorthalidone which does have a diuretic effect. We reviewed the importance of monitoring her sodium intake and limiting this to less than 2000 mg daily. - Continue with plans for an echocardiogram to rule-out any structural abnormalities.   Time:   Today, I have spent 18 minutes with the patient with telehealth technology discussing the above problems.     Medication Adjustments/Labs and  Tests Ordered: Current medicines are reviewed at length with the patient today.  Concerns regarding medicines are outlined above.   Tests Ordered: No orders of the defined types were placed in this encounter.   Medication Changes: No orders of the defined types were placed in this encounter.   Follow Up:  In Person in 1 year(s)  Signed, Erma Heritage, PA-C  07/27/2020 2:34 PM    [place]

## 2020-07-27 ENCOUNTER — Encounter: Payer: Self-pay | Admitting: Student

## 2020-07-27 ENCOUNTER — Other Ambulatory Visit: Payer: Self-pay

## 2020-07-27 ENCOUNTER — Other Ambulatory Visit (INDEPENDENT_AMBULATORY_CARE_PROVIDER_SITE_OTHER): Payer: Self-pay | Admitting: Nurse Practitioner

## 2020-07-27 ENCOUNTER — Telehealth (INDEPENDENT_AMBULATORY_CARE_PROVIDER_SITE_OTHER): Payer: No Typology Code available for payment source | Admitting: Student

## 2020-07-27 VITALS — BP 129/76 | HR 72 | Ht 70.0 in | Wt 235.0 lb

## 2020-07-27 DIAGNOSIS — R600 Localized edema: Secondary | ICD-10-CM | POA: Diagnosis not present

## 2020-07-27 DIAGNOSIS — R42 Dizziness and giddiness: Secondary | ICD-10-CM | POA: Diagnosis not present

## 2020-07-27 DIAGNOSIS — E876 Hypokalemia: Secondary | ICD-10-CM

## 2020-07-27 DIAGNOSIS — I10 Essential (primary) hypertension: Secondary | ICD-10-CM

## 2020-07-27 DIAGNOSIS — R002 Palpitations: Secondary | ICD-10-CM | POA: Diagnosis not present

## 2020-07-27 NOTE — Patient Instructions (Signed)
Medication Instructions:  Your physician recommends that you continue on your current medications as directed. Please refer to the Current Medication list given to you today.  Take Losartan in the evening.   *If you need a refill on your cardiac medications before your next appointment, please call your pharmacy*   Lab Work: NONE   If you have labs (blood work) drawn today and your tests are completely normal, you will receive your results only by:  [place] (if you have MyChart) OR  A paper copy in the mail If you have any lab test that is abnormal or we need to change your treatment, we will call you to review the results.   Testing/Procedures: NONE    Follow-Up: At [facility], you and your health needs are our priority.  As part of our continuing mission to provide you with exceptional heart care, we have created designated Provider Care Teams.  These Care Teams include your primary Cardiologist (physician) and Advanced Practice Providers (APPs -  Physician Assistants and Nurse Practitioners) who all work together to provide you with the care you need, when you need it.  We recommend signing up for the patient portal called "MyChart".  Sign up information is provided on this After Visit Summary.  MyChart is used to connect with patients for Virtual Visits (Telemedicine).  Patients are able to view lab/test results, encounter notes, upcoming appointments, etc.  Non-urgent messages can be sent to your provider as well.   To learn more about what you can do with MyChart, go to [email or web address].    Your next appointment:   1 year(s)  The format for your next appointment:   In Person  Provider:   Dorris Carnes, MD   Other Instructions Thank you for choosing [facility]!

## 2020-07-31 ENCOUNTER — Other Ambulatory Visit: Payer: Self-pay

## 2020-07-31 ENCOUNTER — Other Ambulatory Visit (INDEPENDENT_AMBULATORY_CARE_PROVIDER_SITE_OTHER): Payer: Self-pay | Admitting: Nurse Practitioner

## 2020-07-31 ENCOUNTER — Other Ambulatory Visit (INDEPENDENT_AMBULATORY_CARE_PROVIDER_SITE_OTHER): Payer: No Typology Code available for payment source

## 2020-07-31 DIAGNOSIS — R5381 Other malaise: Secondary | ICD-10-CM

## 2020-07-31 LAB — CBC WITH DIFFERENTIAL/PLATELET
Absolute Monocytes: 757 cells/uL (ref 200–950)
Basophils Absolute: 60 cells/uL (ref 0–200)
Basophils Relative: 0.7 %
Eosinophils Absolute: 353 cells/uL (ref 15–500)
Eosinophils Relative: 4.1 %
HCT: 45.1 % — ABNORMAL HIGH (ref 35.0–45.0)
Hemoglobin: 14.9 g/dL (ref 11.7–15.5)
Lymphs Abs: 1875 cells/uL (ref 850–3900)
MCH: 30.6 pg (ref 27.0–33.0)
MCHC: 33 g/dL (ref 32.0–36.0)
MCV: 92.6 fL (ref 80.0–100.0)
MPV: 10.3 fL (ref 7.5–12.5)
Monocytes Relative: 8.8 %
Neutro Abs: 5556 cells/uL (ref 1500–7800)
Neutrophils Relative %: 64.6 %
Platelets: 323 10*3/uL (ref 140–400)
RBC: 4.87 10*6/uL (ref 3.80–5.10)
RDW: 11.8 % (ref 11.0–15.0)
Total Lymphocyte: 21.8 %
WBC: 8.6 10*3/uL (ref 3.8–10.8)

## 2020-07-31 LAB — T3, FREE: T3, Free: 2.8 pg/mL (ref 2.3–4.2)

## 2020-07-31 LAB — COMPLETE METABOLIC PANEL WITH GFR
AG Ratio: 1.9 (calc) (ref 1.0–2.5)
ALT: 22 U/L (ref 6–29)
AST: 17 U/L (ref 10–35)
Albumin: 4.1 g/dL (ref 3.6–5.1)
Alkaline phosphatase (APISO): 62 U/L (ref 37–153)
BUN/Creatinine Ratio: 12 (calc) (ref 6–22)
BUN: 13 mg/dL (ref 7–25)
CO2: 30 mmol/L (ref 20–32)
Calcium: 9.7 mg/dL (ref 8.6–10.4)
Chloride: 100 mmol/L (ref 98–110)
Creat: 1.07 mg/dL — ABNORMAL HIGH (ref 0.50–1.05)
GFR, Est African American: 68 mL/min/{1.73_m2} (ref >=60)
GFR, Est Non African American: 58 mL/min/{1.73_m2} — ABNORMAL LOW (ref >=60)
Globulin: 2.2 g/dL (calc) (ref 1.9–3.7)
Glucose, Bld: 131 mg/dL — ABNORMAL HIGH (ref 65–99)
Potassium: 3.8 mmol/L (ref 3.5–5.3)
Sodium: 140 mmol/L (ref 135–146)
Total Bilirubin: 0.6 mg/dL (ref 0.2–1.2)
Total Protein: 6.3 g/dL (ref 6.1–8.1)

## 2020-07-31 LAB — PROGESTERONE: Progesterone: 0.6 ng/mL

## 2020-07-31 LAB — TSH: TSH: 1.32 mIU/L

## 2020-07-31 LAB — ESTRADIOL: Estradiol: <15 pg/mL

## 2020-07-31 LAB — VITAMIN B12: Vitamin B-12: 300 pg/mL (ref 200–1100)

## 2020-07-31 LAB — T4, FREE: Free T4: 1.1 ng/dL (ref 0.8–1.8)

## 2020-08-01 ENCOUNTER — Encounter (INDEPENDENT_AMBULATORY_CARE_PROVIDER_SITE_OTHER): Payer: Self-pay | Admitting: Nurse Practitioner

## 2020-08-01 NOTE — Telephone Encounter (Signed)
PLEASE ADVISE.

## 2020-08-01 NOTE — Telephone Encounter (Signed)
Please advise 

## 2020-08-02 ENCOUNTER — Ambulatory Visit (HOSPITAL_COMMUNITY): Payer: No Typology Code available for payment source

## 2020-08-02 NOTE — Telephone Encounter (Signed)
ok 

## 2020-08-04 ENCOUNTER — Encounter (INDEPENDENT_AMBULATORY_CARE_PROVIDER_SITE_OTHER): Payer: Self-pay | Admitting: Nurse Practitioner

## 2020-08-05 ENCOUNTER — Other Ambulatory Visit: Payer: Self-pay

## 2020-08-05 ENCOUNTER — Ambulatory Visit (HOSPITAL_COMMUNITY)
Admission: RE | Admit: 2020-08-05 | Discharge: 2020-08-05 | Disposition: A | Discharge status: Home / Self Care | Payer: No Typology Code available for payment source | Source: Ambulatory Visit | Attending: Nurse Practitioner | Admitting: Nurse Practitioner

## 2020-08-05 DIAGNOSIS — R42 Dizziness and giddiness: Secondary | ICD-10-CM | POA: Diagnosis not present

## 2020-08-06 ENCOUNTER — Encounter (INDEPENDENT_AMBULATORY_CARE_PROVIDER_SITE_OTHER): Payer: Self-pay | Admitting: Nurse Practitioner

## 2020-08-07 ENCOUNTER — Other Ambulatory Visit (INDEPENDENT_AMBULATORY_CARE_PROVIDER_SITE_OTHER): Payer: No Typology Code available for payment source

## 2020-08-08 ENCOUNTER — Telehealth (INDEPENDENT_AMBULATORY_CARE_PROVIDER_SITE_OTHER): Payer: No Typology Code available for payment source | Admitting: Internal Medicine

## 2020-08-08 ENCOUNTER — Encounter (INDEPENDENT_AMBULATORY_CARE_PROVIDER_SITE_OTHER): Payer: Self-pay | Admitting: Internal Medicine

## 2020-08-08 VITALS — BP 112/72 | HR 79 | Ht 70.0 in | Wt 238.0 lb

## 2020-08-08 DIAGNOSIS — R5383 Other fatigue: Secondary | ICD-10-CM | POA: Diagnosis not present

## 2020-08-08 DIAGNOSIS — R5381 Other malaise: Secondary | ICD-10-CM

## 2020-08-08 DIAGNOSIS — E1100 Type 2 diabetes mellitus with hyperosmolarity without nonketotic hyperglycemic-hyperosmolar coma (NKHHC): Secondary | ICD-10-CM | POA: Diagnosis not present

## 2020-08-08 MED ORDER — THYROID 30 MG PO TABS
30.0000 mg | ORAL_TABLET | Freq: Every day | ORAL | 3 refills | Status: DC
Start: 2020-08-08 — End: 2020-12-13

## 2020-08-08 NOTE — Progress Notes (Signed)
Metrics: Intervention Frequency ACO  Documented Smoking Status Yearly  Screened one or more times in 24 months  Cessation Counseling or  Active cessation medication Past 24 months  Past 24 months   Guideline developer: UpToDate (See UpToDate for funding source) Date Released: 2014       Wellness Office Visit  Subjective:  Patient ID: Sydney Taylor, female    DOB: 1966-09-19  Age: 54 y.o. MRN: [id number]  CC: This is an audio telemedicine visit with the permission of the patient who is at home and I am in my office.  We were trying to connect via video but this was not successful so this is a phone call visit.  I used 2 identifiers to identify the patient.  She had a few questions that she wanted answered which are outlined below. HPI She wanted to discuss her MRI results which showed an element of chronic microvascular disease. She also saw a dermatologist and apparently reverse T3 levels were elevated and she wondered whether she would benefit from thyroid treatment in view of her hair loss. Her diabetes has been doing better recently and a hemoglobin A1c about a month ago was 6.4% which is a huge improvement.  Past Medical History:  Diagnosis Date   Anemia    Anxiety    Arrhythmia    Bipolar depression ([place]) 12/28/2019   Chronic headaches    Depression    Diabetes mellitus without complication ([facility])    Fibromyalgia    GERD (gastroesophageal reflux disease)    HLD (hyperlipidemia)    Hypertension    Insect bite    Prediabetes    Sleep apnea    Urinary incontinence    Past Surgical History:  Procedure Laterality Date   ESOPHAGOGASTRODUODENOSCOPY N/A 05/12/2020   Procedure: ESOPHAGOGASTRODUODENOSCOPY (EGD);  Surgeon: Daneil Dolin, MD;  [facility];  Service: Endoscopy;  Laterality: N/A;  11:15am   Joint fusion on foot Right    MALONEY DILATION N/A 05/12/2020   Procedure: MALONEY DILATION;  Surgeon: Daneil Dolin, MD;  Location[facility];  Service: Endoscopy;  Laterality: N/A;   NASAL SEPTUM SURGERY       Family History  Problem Relation Age of Onset   Hypertension Father    Hyperlipidemia Father    Diabetes Father    Hypertension Mother    Hyperlipidemia Mother    Diabetes Mellitus II Mother    Congestive Heart Failure Mother    Hypertension Sister    Diabetes Sister    Hypertension Brother    Diabetes Maternal Grandmother    Lupus Paternal Grandmother    Colon cancer Neg Hx    Esophageal cancer Neg Hx    Gastric cancer Neg Hx     Social History   Social History Narrative   Married for 3 years.Lives with husband.Bachelors in Education officer, museum.   Social History   Tobacco Use   Smoking status: Former Smoker    Types: Cigarettes    Quit date: 12/30/1982    Years since quitting: 37.6   Smokeless tobacco: Never Used  Substance Use Topics   Alcohol use: Not Currently    Alcohol/week: 0.0 standard drinks    Current Meds  Medication Sig   ALPRAZolam (XANAX) 0.5 MG tablet Take 0.5 mg by mouth daily as needed.   chlorthalidone (HYGROTON) 25 MG tablet Take 1 tablet (25 mg total) by mouth daily.   glipiZIDE (GLUCOTROL) 10 MG tablet Take 10 mg by mouth 2 (two) times daily before  a meal.   hydrOXYzine (ATARAX/VISTARIL) 25 MG tablet Take 1 tablet (25 mg total) by mouth 3 (three) times daily as needed for anxiety.   KLOR-CON M20 20 MEQ tablet TAKE 2 TABLETS BY MOUTH IN THE MORNING AND 1 TABLET BY MOUTH IN THE EVENING   losartan (COZAAR) 25 MG tablet Take 1 tablet (25 mg total) by mouth daily.   metoprolol tartrate (LOPRESSOR) 50 MG tablet Take 1 tablet (50 mg total) by mouth 2 (two) times daily.   pantoprazole (PROTONIX) 40 MG tablet Take 1 tablet (40 mg total) by mouth 2 (two) times daily.   sertraline (ZOLOFT) 25 MG tablet Take by mouth.   traZODone (DESYREL) 50 MG tablet Take 1 tablet (50 mg total) by mouth at bedtime as needed and may repeat dose one time if needed for sleep.       Depression screen [facility] 2/9 07/20/2020 04/06/2020 03/27/2020  Decreased Interest 0 0 0  Down, Depressed, Hopeless 3 0 0  PHQ - 2 Score 3 0 0  Altered sleeping 3 - -  Tired, decreased energy 3 - -  Change in appetite 3 - -  Feeling bad or failure about yourself  3 - -  Trouble concentrating 3 - -  Moving slowly or fidgety/restless 0 - -  Suicidal thoughts 3 - -  PHQ-9 Score 21 - -     Objective:   Today's Vitals: BP 112/72    Pulse 79    Ht 5\' 10"  (1.778 m)    Wt 238 lb (108 kg)    LMP  (LMP Unknown)    BMI 34.15 kg/m  Vitals with BMI 08/08/2020 07/27/2020 07/20/2020  Height 5\' 10"  5\' 10"  5\' 10"   Weight 238 lbs 235 lbs 235 lbs  BMI 34.15 12.45 80.99  Systolic 833 825 -  Diastolic 72 76 -  Pulse 79 72 -  Some encounter information is confidential and restricted. Go to Review Flowsheets activity to see all data.     Physical Exam   Speech on the phone is normal and she appears to be alert and orientated.  Blood pressure readings have been taken at home.    Assessment   1. Type 2 diabetes mellitus with hyperosmolarity without coma, without long-term current use of insulin ([place])   2. Malaise and fatigue       Tests ordered No orders of the defined types were placed in this encounter.    Plan: 1. I explained the MRI result in detail and answered all her questions regarding this. 2. As far as her symptoms of hair loss, fatigue, weight gain are concerned, I think she may have symptoms of thyroid deficiency and certainly a T3 level that was checked previously was suboptimal.  Therefore, after discussion, I am going to start her on NP thyroid 30 mg daily, off label, for her symptoms of thyroid deficiency. 3. I explained possible side effects and how to deal with them. 4. She will follow-up with Sarah at the end of August when I expect she will need thyroid function test repeated to see how they are and also to see how she is doing. 5. This phone call lasted 14  minutes.   Meds ordered this encounter  Medications   thyroid (NP THYROID) 30 MG tablet    Sig: Take 1 tablet (30 mg total) by mouth daily before breakfast.    Dispense:  30 tablet    Refill:  3    Tametria Aho Luther Parody, MD

## 2020-08-11 ENCOUNTER — Other Ambulatory Visit: Payer: Self-pay

## 2020-08-11 ENCOUNTER — Ambulatory Visit (HOSPITAL_COMMUNITY)
Admission: RE | Admit: 2020-08-11 | Discharge: 2020-08-11 | Disposition: A | Discharge status: Home / Self Care | Payer: No Typology Code available for payment source | Source: Ambulatory Visit | Attending: Nurse Practitioner | Admitting: Nurse Practitioner

## 2020-08-11 DIAGNOSIS — R202 Paresthesia of skin: Secondary | ICD-10-CM | POA: Insufficient documentation

## 2020-08-11 DIAGNOSIS — R42 Dizziness and giddiness: Secondary | ICD-10-CM

## 2020-08-11 LAB — ECHOCARDIOGRAM COMPLETE
AR max vel: 2.44 cm2
AV Area VTI: 2.54 cm2
AV Area mean vel: 2.48 cm2
AV Mean grad: 5.3 mmHg
AV Peak grad: 9.4 mmHg
Ao pk vel: 1.53 m/s
Area-P 1/2: 3.28 cm2
S' Lateral: 2.12 cm

## 2020-08-11 NOTE — Progress Notes (Signed)
*  PRELIMINARY RESULTS* Echocardiogram 2D Echocardiogram has been performed.  Sydney Taylor 08/11/2020, 3:34 PM

## 2020-08-14 ENCOUNTER — Encounter (INDEPENDENT_AMBULATORY_CARE_PROVIDER_SITE_OTHER): Payer: Self-pay | Admitting: Nurse Practitioner

## 2020-08-15 ENCOUNTER — Other Ambulatory Visit (INDEPENDENT_AMBULATORY_CARE_PROVIDER_SITE_OTHER): Payer: Self-pay | Admitting: Internal Medicine

## 2020-08-15 MED ORDER — VITAMIN D-3 125 MCG (5000 UT) PO TABS: 2.0000 | ORAL_TABLET | Freq: Every day | ORAL | 3 refills | Status: DC

## 2020-08-19 ENCOUNTER — Encounter (INDEPENDENT_AMBULATORY_CARE_PROVIDER_SITE_OTHER): Payer: Self-pay | Admitting: Nurse Practitioner

## 2020-08-20 ENCOUNTER — Ambulatory Visit
Admission: EM | Admit: 2020-08-20 | Discharge: 2020-08-20 | Disposition: A | Discharge status: Home / Self Care | Payer: No Typology Code available for payment source | Attending: Family Medicine | Admitting: Family Medicine

## 2020-08-20 ENCOUNTER — Other Ambulatory Visit: Payer: Self-pay

## 2020-08-20 DIAGNOSIS — Z1152 Encounter for screening for COVID-19: Secondary | ICD-10-CM

## 2020-08-20 DIAGNOSIS — R0981 Nasal congestion: Secondary | ICD-10-CM

## 2020-08-20 DIAGNOSIS — B349 Viral infection, unspecified: Secondary | ICD-10-CM

## 2020-08-20 DIAGNOSIS — B9789 Other viral agents as the cause of diseases classified elsewhere: Secondary | ICD-10-CM

## 2020-08-20 DIAGNOSIS — R0789 Other chest pain: Secondary | ICD-10-CM

## 2020-08-20 DIAGNOSIS — J028 Acute pharyngitis due to other specified organisms: Secondary | ICD-10-CM

## 2020-08-20 MED ORDER — CETIRIZINE-PSEUDOEPHEDRINE ER 5-120 MG PO TB12
1.0000 | ORAL_TABLET | Freq: Every day | ORAL | 0 refills | Status: DC
Start: 2020-08-20 — End: 2020-09-12

## 2020-08-20 NOTE — ED Triage Notes (Signed)
Pt presents with c/o sore throat and reports chest heaviness on occasion since wednesday

## 2020-08-20 NOTE — Discharge Instructions (Signed)
Your COVID test is pending.  You should self quarantine until the test result is back.    Take Tylenol as needed for fever or discomfort.  Rest and keep yourself hydrated.    Go to the emergency department if you develop acute worsening symptoms.     

## 2020-08-21 LAB — NOVEL CORONAVIRUS, NAA: SARS-CoV-2, NAA: NOT DETECTED

## 2020-08-21 LAB — SARS-COV-2, NAA 2 DAY TAT

## 2020-08-23 NOTE — ED Provider Notes (Signed)
[place]   [id number] 08/20/20 Arrival Time: 1419   CC: COVID symptoms  SUBJECTIVE: History from: patient.  Sydney Taylor is a 54 y.o. female who presents with abrupt onset of nasal congestion, PND, chest heaviness, sore throat and persistent dry cough for 4 days. Denies sick exposure to COVID, flu or strep. Denies recent travel. Has negative history of Covid. Has completed Covid vaccines. Has not taken OTC medications for this. There are no aggravating or alleviating factors. Denies previous symptoms in the past. Denies fever, chills, fatigue, sinus pain, rhinorrhea, SOB, wheezing, chest pain, nausea, changes in bowel or bladder habits.    ROS: As per HPI.  All other pertinent ROS negative.     Past Medical History:  Diagnosis Date  . Anemia   . Anxiety   . Arrhythmia   . Bipolar depression ([place]) 12/28/2019  . Chronic headaches   . Depression   . Diabetes mellitus without complication ([place])   . Fibromyalgia   . GERD (gastroesophageal reflux disease)   . HLD (hyperlipidemia)   . Hypertension   . Insect bite   . Prediabetes   . Sleep apnea   . Urinary incontinence    Past Surgical History:  Procedure Laterality Date  . ESOPHAGOGASTRODUODENOSCOPY N/A 05/12/2020   Procedure: ESOPHAGOGASTRODUODENOSCOPY (EGD);  Surgeon: Daneil Dolin, MD;  [facility];  Service: Endoscopy;  Laterality: N/A;  11:15am  . Joint fusion on foot Right   . MALONEY DILATION N/A 05/12/2020   Procedure: Venia Minks DILATION;  Surgeon: Daneil Dolin, MD;  Location: [facility];  Service: Endoscopy;  Laterality: N/A;  . NASAL SEPTUM SURGERY     Allergies  Allergen Reactions  . Keflex [Cephalexin] Itching and Swelling   No current facility-administered medications on file prior to encounter.   Current Outpatient Medications on File Prior to Encounter  Medication Sig Dispense Refill  . ALPRAZolam (XANAX) 0.5 MG tablet Take 0.5 mg by mouth daily as needed.    .  chlorthalidone (HYGROTON) 25 MG tablet Take 1 tablet (25 mg total) by mouth daily.    . Cholecalciferol (VITAMIN D-3) 125 MCG (5000 UT) TABS Take 2 tablets by mouth daily. 60 tablet 3  . glipiZIDE (GLUCOTROL) 10 MG tablet Take 10 mg by mouth 2 (two) times daily before a meal.    . hydrOXYzine (ATARAX/VISTARIL) 25 MG tablet Take 1 tablet (25 mg total) by mouth 3 (three) times daily as needed for anxiety. 30 tablet 0  . KLOR-CON M20 20 MEQ tablet TAKE 2 TABLETS BY MOUTH IN THE MORNING AND 1 TABLET BY MOUTH IN THE EVENING 270 tablet 1  . losartan (COZAAR) 25 MG tablet Take 1 tablet (25 mg total) by mouth daily. 30 tablet 0  . metoprolol tartrate (LOPRESSOR) 50 MG tablet Take 1 tablet (50 mg total) by mouth 2 (two) times daily. 60 tablet 0  . pantoprazole (PROTONIX) 40 MG tablet Take 1 tablet (40 mg total) by mouth 2 (two) times daily. 60 tablet 0  . sertraline (ZOLOFT) 25 MG tablet Take by mouth.    . thyroid (NP THYROID) 30 MG tablet Take 1 tablet (30 mg total) by mouth daily before breakfast. 30 tablet 3  . traZODone (DESYREL) 50 MG tablet Take 1 tablet (50 mg total) by mouth at bedtime as needed and may repeat dose one time if needed for sleep. 30 tablet 0  . [DISCONTINUED] chlorthalidone (HYGROTON) 50 MG tablet Take 0.5 tablets (25 mg total) by mouth daily. 90 tablet  0  . [DISCONTINUED] potassium chloride SA (KLOR-CON) 20 MEQ tablet Take 1 tablet (20 mEq total) by mouth daily. 15 tablet 0  . [DISCONTINUED] promethazine (PHENERGAN) 25 MG tablet Take 1 tablet (25 mg total) by mouth every 12 (twelve) hours as needed for nausea or vomiting. 15 tablet 0   Social History   Socioeconomic History  . Marital status: Married    Spouse name: Not on file  . Number of children: 0  . Years of education: Not on file  . Highest education level: Not on file  Occupational History  . Occupation: research associate/drug development  Tobacco Use  . Smoking status: Former Smoker    Types: Cigarettes    Quit  date: 12/30/1982    Years since quitting: 37.6  . Smokeless tobacco: Never Used  Vaping Use  . Vaping Use: Never used  Substance and Sexual Activity  . Alcohol use: Not Currently    Alcohol/week: 0.0 standard drinks  . Drug use: No  . Sexual activity: Not Currently  Other Topics Concern  . Not on file  Social History Narrative   Married for 3 years.Lives with husband.Bachelors in Education officer, museum.   Social Determinants of Health   Financial Resource Strain:   . Difficulty of Paying Living Expenses: Not on file  Food Insecurity:   . Worried About Charity fundraiser in the Last Year: Not on file  . Ran Out of Food in the Last Year: Not on file  Transportation Needs:   . Lack of Transportation (Medical): Not on file  . Lack of Transportation (Non-Medical): Not on file  Physical Activity:   . Days of Exercise per Week: Not on file  . Minutes of Exercise per Session: Not on file  Stress:   . Feeling of Stress : Not on file  Social Connections:   . Frequency of Communication with Friends and Family: Not on file  . Frequency of Social Gatherings with Friends and Family: Not on file  . Attends Religious Services: Not on file  . Active Member of Clubs or Organizations: Not on file  . Attends Archivist Meetings: Not on file  . Marital Status: Not on file  Intimate Partner Violence:   . Fear of Current or Ex-Partner: Not on file  . Emotionally Abused: Not on file  . Physically Abused: Not on file  . Sexually Abused: Not on file   Family History  Problem Relation Age of Onset  . Hypertension Father   . Hyperlipidemia Father   . Diabetes Father   . Hypertension Mother   . Hyperlipidemia Mother   . Diabetes Mellitus II Mother   . Congestive Heart Failure Mother   . Hypertension Sister   . Diabetes Sister   . Hypertension Brother   . Diabetes Maternal Grandmother   . Lupus Paternal Grandmother   . Colon cancer Neg Hx   . Esophageal cancer Neg Hx   . Gastric cancer Neg Hx      OBJECTIVE:  Vitals:   08/20/20 1505  BP: 120/82  Pulse: 74  Resp: 20  Temp: 98.8 F (37.1 C)  SpO2: 97%     General appearance: alert; appears fatigued, but nontoxic; speaking in full sentences and tolerating own secretions HEENT: NCAT; Ears: EACs clear, TMs pearly gray; Eyes: PERRL.  EOM grossly intact. Sinuses: nontender; Nose: nares patent without rhinorrhea, Throat: oropharynx clear, tonsils non erythematous or enlarged, uvula midline  Neck: supple without LAD Lungs: unlabored respirations, symmetrical air entry; cough:  mild; no respiratory distress; CTAB Heart: regular rate and rhythm.  Radial pulses 2+ symmetrical bilaterally Skin: warm and dry Psychological: alert and cooperative; normal mood and affect  LABS:  No results found for this or any previous visit (from the past 24 hour(s)).   ASSESSMENT & PLAN:  1. Viral illness   2. Encounter for screening for COVID-19   3. Sore throat (viral)   4. Chest heaviness   5. Nasal congestion     Meds ordered this encounter  Medications  . cetirizine-pseudoephedrine (ZYRTEC-D) 5-120 MG tablet    Sig: Take 1 tablet by mouth daily.    Dispense:  30 tablet    Refill:  0    Order Specific Question:   Supervising Provider    Answer:   Chase Picket [id number]    Prescribed zyrtec D Take daily for symptomatic treatment COVID testing ordered.  It will take between 1-2 days for test results.  Someone will contact you regarding abnormal results.    Patient should remain in quarantine until they have received Covid results.  If negative you may resume normal activities (go back to work/school) while practicing hand hygiene, social distance, and mask wearing.  If positive, patient should remain in quarantine for 10 days from symptom onset AND greater than 72 hours after symptoms resolution (absence of fever without the use of fever-reducing medication and improvement in respiratory symptoms), whichever is longer Get plenty  of rest and push fluids Use OTC zyrtec for nasal congestion, runny nose, and/or sore throat Use OTC flonase for nasal congestion and runny nose Use medications daily for symptom relief Use OTC medications like ibuprofen or tylenol as needed fever or pain Call or go to the ED if you have any new or worsening symptoms such as fever, worsening cough, shortness of breath, chest tightness, chest pain, turning blue, changes in mental status.  Reviewed expectations re: course of current medical issues. Questions answered. Outlined signs and symptoms indicating need for more acute intervention. Patient verbalized understanding. After Visit Summary given.         Faustino Congress, NP 08/23/20 1230

## 2020-08-24 ENCOUNTER — Encounter (INDEPENDENT_AMBULATORY_CARE_PROVIDER_SITE_OTHER): Payer: Self-pay | Admitting: Nurse Practitioner

## 2020-08-24 ENCOUNTER — Other Ambulatory Visit (INDEPENDENT_AMBULATORY_CARE_PROVIDER_SITE_OTHER): Payer: Self-pay | Admitting: Internal Medicine

## 2020-08-24 MED ORDER — METOPROLOL TARTRATE 50 MG PO TABS: 50.0000 mg | ORAL_TABLET | Freq: Two times a day (BID) | ORAL | 0 refills | Status: DC

## 2020-08-28 ENCOUNTER — Other Ambulatory Visit: Payer: Self-pay

## 2020-08-28 ENCOUNTER — Ambulatory Visit
Admission: EM | Admit: 2020-08-28 | Discharge: 2020-08-28 | Disposition: A | Discharge status: Home / Self Care | Payer: No Typology Code available for payment source | Attending: Emergency Medicine | Admitting: Emergency Medicine

## 2020-08-28 ENCOUNTER — Ambulatory Visit (INDEPENDENT_AMBULATORY_CARE_PROVIDER_SITE_OTHER): Payer: No Typology Code available for payment source | Admitting: Nurse Practitioner

## 2020-08-28 ENCOUNTER — Encounter: Payer: Self-pay | Admitting: Emergency Medicine

## 2020-08-28 DIAGNOSIS — J019 Acute sinusitis, unspecified: Secondary | ICD-10-CM | POA: Diagnosis not present

## 2020-08-28 MED ORDER — DOXYCYCLINE HYCLATE 100 MG PO CAPS
100.0000 mg | ORAL_CAPSULE | Freq: Two times a day (BID) | ORAL | 0 refills | Status: DC
Start: 2020-08-28 — End: 2020-09-01

## 2020-08-28 MED ORDER — DOXYCYCLINE HYCLATE 100 MG PO CAPS
100.0000 mg | ORAL_CAPSULE | Freq: Two times a day (BID) | ORAL | 0 refills | Status: DC
Start: 2020-08-28 — End: 2020-08-28

## 2020-08-28 NOTE — ED Provider Notes (Signed)
[place]   [id number] 08/28/20 Arrival Time: 6834   CC: sinus congestion  SUBJECTIVE: History from: patient.  Sydney Taylor is a 54 y.o. female who presents with sinus pain, sinus pressure, congestion, fatigue, and sore throat x 12 days.  Denies sick exposure to COVID, flu or strep.  Has tried OTC medications without relief.  Symptoms are made worse with time.  Reports previous symptoms in the past.   Denies chills, SOB, wheezing, chest pain, nausea, changes in bowel or bladder habits.     ROS: As per HPI.  All other pertinent ROS negative.     Past Medical History:  Diagnosis Date  . Anemia   . Anxiety   . Arrhythmia   . Bipolar depression ([place]) 12/28/2019  . Chronic headaches   . Depression   . Diabetes mellitus without complication ([place])   . Fibromyalgia   . GERD (gastroesophageal reflux disease)   . HLD (hyperlipidemia)   . Hypertension   . Insect bite   . Prediabetes   . Sleep apnea   . Urinary incontinence    Past Surgical History:  Procedure Laterality Date  . ESOPHAGOGASTRODUODENOSCOPY N/A 05/12/2020   Procedure: ESOPHAGOGASTRODUODENOSCOPY (EGD);  Surgeon: Daneil Dolin, MD;  [facility];  Service: Endoscopy;  Laterality: N/A;  11:15am  . Joint fusion on foot Right   . MALONEY DILATION N/A 05/12/2020   Procedure: Venia Minks DILATION;  Surgeon: Daneil Dolin, MD;  [facility];  Service: Endoscopy;  Laterality: N/A;  . NASAL SEPTUM SURGERY     Allergies  Allergen Reactions  . Keflex [Cephalexin] Itching and Swelling   No current facility-administered medications on file prior to encounter.   Current Outpatient Medications on File Prior to Encounter  Medication Sig Dispense Refill  . ALPRAZolam (XANAX) 0.5 MG tablet Take 0.5 mg by mouth daily as needed.    . cetirizine-pseudoephedrine (ZYRTEC-D) 5-120 MG tablet Take 1 tablet by mouth daily. 30 tablet 0  . chlorthalidone (HYGROTON) 25 MG tablet Take 1 tablet (25 mg total)  by mouth daily.    . Cholecalciferol (VITAMIN D-3) 125 MCG (5000 UT) TABS Take 2 tablets by mouth daily. 60 tablet 3  . glipiZIDE (GLUCOTROL) 10 MG tablet Take 10 mg by mouth 2 (two) times daily before a meal.    . hydrOXYzine (ATARAX/VISTARIL) 25 MG tablet Take 1 tablet (25 mg total) by mouth 3 (three) times daily as needed for anxiety. 30 tablet 0  . KLOR-CON M20 20 MEQ tablet TAKE 2 TABLETS BY MOUTH IN THE MORNING AND 1 TABLET BY MOUTH IN THE EVENING 270 tablet 1  . losartan (COZAAR) 25 MG tablet Take 1 tablet (25 mg total) by mouth daily. 30 tablet 0  . metoprolol tartrate (LOPRESSOR) 50 MG tablet Take 1 tablet (50 mg total) by mouth 2 (two) times daily. 180 tablet 0  . pantoprazole (PROTONIX) 40 MG tablet Take 1 tablet (40 mg total) by mouth 2 (two) times daily. 60 tablet 0  . sertraline (ZOLOFT) 25 MG tablet Take by mouth.    . thyroid (NP THYROID) 30 MG tablet Take 1 tablet (30 mg total) by mouth daily before breakfast. 30 tablet 3  . traZODone (DESYREL) 50 MG tablet Take 1 tablet (50 mg total) by mouth at bedtime as needed and may repeat dose one time if needed for sleep. 30 tablet 0  . [DISCONTINUED] chlorthalidone (HYGROTON) 50 MG tablet Take 0.5 tablets (25 mg total) by mouth daily. 90 tablet 0  . [  DISCONTINUED] potassium chloride SA (KLOR-CON) 20 MEQ tablet Take 1 tablet (20 mEq total) by mouth daily. 15 tablet 0  . [DISCONTINUED] promethazine (PHENERGAN) 25 MG tablet Take 1 tablet (25 mg total) by mouth every 12 (twelve) hours as needed for nausea or vomiting. 15 tablet 0   Social History   Socioeconomic History  . Marital status: Married    Spouse name: Not on file  . Number of children: 0  . Years of education: Not on file  . Highest education level: Not on file  Occupational History  . Occupation: research associate/drug development  Tobacco Use  . Smoking status: Former Smoker    Types: Cigarettes    Quit date: 12/30/1982    Years since quitting: 37.6  . Smokeless  tobacco: Never Used  Vaping Use  . Vaping Use: Never used  Substance and Sexual Activity  . Alcohol use: Not Currently    Alcohol/week: 0.0 standard drinks  . Drug use: No  . Sexual activity: Not Currently  Other Topics Concern  . Not on file  Social History Narrative   Married for 3 years.Lives with husband.Bachelors in Education officer, museum.   Social Determinants of Health   Financial Resource Strain:   . Difficulty of Paying Living Expenses: Not on file  Food Insecurity:   . Worried About Charity fundraiser in the Last Year: Not on file  . Ran Out of Food in the Last Year: Not on file  Transportation Needs:   . Lack of Transportation (Medical): Not on file  . Lack of Transportation (Non-Medical): Not on file  Physical Activity:   . Days of Exercise per Week: Not on file  . Minutes of Exercise per Session: Not on file  Stress:   . Feeling of Stress : Not on file  Social Connections:   . Frequency of Communication with Friends and Family: Not on file  . Frequency of Social Gatherings with Friends and Family: Not on file  . Attends Religious Services: Not on file  . Active Member of Clubs or Organizations: Not on file  . Attends Archivist Meetings: Not on file  . Marital Status: Not on file  Intimate Partner Violence:   . Fear of Current or Ex-Partner: Not on file  . Emotionally Abused: Not on file  . Physically Abused: Not on file  . Sexually Abused: Not on file   Family History  Problem Relation Age of Onset  . Hypertension Father   . Hyperlipidemia Father   . Diabetes Father   . Hypertension Mother   . Hyperlipidemia Mother   . Diabetes Mellitus II Mother   . Congestive Heart Failure Mother   . Hypertension Sister   . Diabetes Sister   . Hypertension Brother   . Diabetes Maternal Grandmother   . Lupus Paternal Grandmother   . Colon cancer Neg Hx   . Esophageal cancer Neg Hx   . Gastric cancer Neg Hx     OBJECTIVE:  Vitals:   08/28/20 1419  BP: 118/78    Pulse: 72  Resp: 16  Temp: 98.9 F (37.2 C)  TempSrc: Oral  SpO2: 96%     General appearance: alert; appears fatigued, but nontoxic; speaking in full sentences and tolerating own secretions HEENT: NCAT; Ears: EACs clear, TMs pearly gray; Eyes: PERRL.  EOM grossly intact. Sinuses: TTP over maxillary sinuses; Nose: nares patent without rhinorrhea, Throat: oropharynx clear, tonsils non erythematous or enlarged, uvula midline  Neck: supple without LAD Lungs: unlabored respirations,  symmetrical air entry; cough: absent; no respiratory distress; CTAB Heart: regular rate and rhythm.   Skin: warm and dry Psychological: alert and cooperative; normal mood and affect   ASSESSMENT & PLAN:  1. Acute non-recurrent sinusitis, unspecified location     Meds ordered this encounter  Medications  . DISCONTD: doxycycline (VIBRAMYCIN) 100 MG capsule    Sig: Take 1 capsule (100 mg total) by mouth 2 (two) times daily.    Dispense:  20 capsule    Refill:  0    Order Specific Question:   Supervising Provider    Answer:   Raylene Everts [[id number]]  . doxycycline (VIBRAMYCIN) 100 MG capsule    Sig: Take 1 capsule (100 mg total) by mouth 2 (two) times daily.    Dispense:  20 capsule    Refill:  0    Order Specific Question:   Supervising Provider    Answer:   Raylene Everts [[id number]]    Get plenty of rest and push fluids Doxycycline for sinus infection Use OTC medications like ibuprofen or tylenol as needed fever or pain Follow up with PCP as needed Call or go to the ED if you have any new or worsening symptoms such as fever, worsening cough, shortness of breath, chest tightness, chest pain, turning blue, changes in mental status, etc...   Reviewed expectations re: course of current medical issues. Questions answered. Outlined signs and symptoms indicating need for more acute intervention. Patient verbalized understanding. After Visit Summary given.         Lestine Box,  PA-C 08/28/20 1459

## 2020-08-28 NOTE — ED Triage Notes (Signed)
was seen 8 days ago neg covid test still not feeling better sinus headache feel like this is a sinus infection

## 2020-08-28 NOTE — Discharge Instructions (Signed)
Get plenty of rest and push fluids Doxycycline for sinus infection Use OTC medications like ibuprofen or tylenol as needed fever or pain Follow up with PCP as needed Call or go to the ED if you have any new or worsening symptoms such as fever, worsening cough, shortness of breath, chest tightness, chest pain, turning blue, changes in mental status, etc..Marland Kitchen

## 2020-09-01 ENCOUNTER — Ambulatory Visit
Admission: EM | Admit: 2020-09-01 | Discharge: 2020-09-01 | Discharge status: Absent without leave | Payer: No Typology Code available for payment source

## 2020-09-01 ENCOUNTER — Ambulatory Visit (INDEPENDENT_AMBULATORY_CARE_PROVIDER_SITE_OTHER): Payer: No Typology Code available for payment source

## 2020-09-01 ENCOUNTER — Ambulatory Visit
Admission: RE | Admit: 2020-09-01 | Discharge: 2020-09-01 | Disposition: A | Discharge status: Home / Self Care | Payer: No Typology Code available for payment source | Source: Ambulatory Visit | Attending: Emergency Medicine | Admitting: Emergency Medicine

## 2020-09-01 ENCOUNTER — Other Ambulatory Visit: Payer: Self-pay

## 2020-09-01 VITALS — BP 116/81 | HR 67 | Temp 98.8°F | Resp 20

## 2020-09-01 DIAGNOSIS — R05 Cough: Secondary | ICD-10-CM | POA: Diagnosis not present

## 2020-09-01 DIAGNOSIS — R5383 Other fatigue: Secondary | ICD-10-CM

## 2020-09-01 DIAGNOSIS — J069 Acute upper respiratory infection, unspecified: Secondary | ICD-10-CM | POA: Diagnosis not present

## 2020-09-01 MED ORDER — PREDNISONE 10 MG (21) PO TBPK
ORAL_TABLET | Freq: Every day | ORAL | 0 refills | Status: DC
Start: 2020-09-01 — End: 2020-09-01

## 2020-09-01 MED ORDER — DEXAMETHASONE SODIUM PHOSPHATE 10 MG/ML IJ SOLN
10.0000 mg | Freq: Once | INTRAMUSCULAR | Status: DC
  Administered 2020-09-01: 10 mg via INTRAMUSCULAR

## 2020-09-01 MED ORDER — BENZONATATE 100 MG PO CAPS
100.0000 mg | ORAL_CAPSULE | Freq: Three times a day (TID) | ORAL | 0 refills | Status: DC
Start: 2020-09-01 — End: 2020-09-01

## 2020-09-01 MED ORDER — BENZONATATE 100 MG PO CAPS
100.0000 mg | ORAL_CAPSULE | Freq: Three times a day (TID) | ORAL | 0 refills | Status: DC
Start: 2020-09-01 — End: 2020-10-16

## 2020-09-01 MED ORDER — PREDNISONE 10 MG (21) PO TBPK
ORAL_TABLET | Freq: Every day | ORAL | 0 refills | Status: DC
Start: 2020-09-01 — End: 2020-09-12

## 2020-09-01 NOTE — Discharge Instructions (Signed)
CXR negative for cardiopulmonary disease Blood work ordered.  If abnormal follow up with PCP next week as scheduled Decadron shot given in office Get plenty of rest and push fluids Tessalon Perles prescribed for cough Prednisone prescribed.  Take as directed and to completion Use OTC zyrtec for nasal congestion, runny nose, and/or sore throat Use OTC flonase for nasal congestion and runny nose Use medications daily for symptom relief Use OTC medications like ibuprofen or tylenol as needed fever or pain Call or go to the ED if you have any new or worsening symptoms such as fever, worsening cough, shortness of breath, chest tightness, chest pain, turning blue, changes in mental status, etc..Marland Kitchen

## 2020-09-01 NOTE — ED Triage Notes (Signed)
Pt returns with continues symptoms of sinus pressure and feeling unwell

## 2020-09-01 NOTE — ED Provider Notes (Addendum)
[place]   [id number] 09/01/20 Arrival Time: 1238   CC: COVID symptoms  SUBJECTIVE: History from: patient.  Sydney Taylor is a 54 y.o. female who presents with sinus congestion, dry cough and fatigue x 3 weeks.  Denies sick exposure to COVID, flu or strep.  Has tried zyrtec D and doxycycline without relief.  Symptoms are made worse with time.  Denies previous symptoms in the past.  Complains of associated nausea.  Denies fever, chills, SOB, wheezing, chest pain, changes in bowel or bladder habits.   ROS: As per HPI.  All other pertinent ROS negative.     Past Medical History:  Diagnosis Date  . Anemia   . Anxiety   . Arrhythmia   . Bipolar depression ([place]) 12/28/2019  . Chronic headaches   . Depression   . Diabetes mellitus without complication ([place])   . Fibromyalgia   . GERD (gastroesophageal reflux disease)   . HLD (hyperlipidemia)   . Hypertension   . Insect bite   . Prediabetes   . Sleep apnea   . Urinary incontinence    Past Surgical History:  Procedure Laterality Date  . ESOPHAGOGASTRODUODENOSCOPY N/A 05/12/2020   Procedure: ESOPHAGOGASTRODUODENOSCOPY (EGD);  Surgeon: Daneil Dolin, MD;  Location: [facility];  Service: Endoscopy;  Laterality: N/A;  11:15am  . Joint fusion on foot Right   . MALONEY DILATION N/A 05/12/2020   Procedure: Venia Minks DILATION;  Surgeon: Daneil Dolin, MD;  Location: [facility];  Service: Endoscopy;  Laterality: N/A;  . NASAL SEPTUM SURGERY     Allergies  Allergen Reactions  . Keflex [Cephalexin] Itching and Swelling   No current facility-administered medications on file prior to encounter.   Current Outpatient Medications on File Prior to Encounter  Medication Sig Dispense Refill  . ALPRAZolam (XANAX) 0.5 MG tablet Take 0.5 mg by mouth daily as needed.    . cetirizine-pseudoephedrine (ZYRTEC-D) 5-120 MG tablet Take 1 tablet by mouth daily. 30 tablet 0  . chlorthalidone (HYGROTON) 25 MG tablet Take 1 tablet (25  mg total) by mouth daily.    . Cholecalciferol (VITAMIN D-3) 125 MCG (5000 UT) TABS Take 2 tablets by mouth daily. 60 tablet 3  . glipiZIDE (GLUCOTROL) 10 MG tablet Take 10 mg by mouth 2 (two) times daily before a meal.    . hydrOXYzine (ATARAX/VISTARIL) 25 MG tablet Take 1 tablet (25 mg total) by mouth 3 (three) times daily as needed for anxiety. 30 tablet 0  . KLOR-CON M20 20 MEQ tablet TAKE 2 TABLETS BY MOUTH IN THE MORNING AND 1 TABLET BY MOUTH IN THE EVENING 270 tablet 1  . losartan (COZAAR) 25 MG tablet Take 1 tablet (25 mg total) by mouth daily. 30 tablet 0  . metoprolol tartrate (LOPRESSOR) 50 MG tablet Take 1 tablet (50 mg total) by mouth 2 (two) times daily. 180 tablet 0  . pantoprazole (PROTONIX) 40 MG tablet Take 1 tablet (40 mg total) by mouth 2 (two) times daily. 60 tablet 0  . sertraline (ZOLOFT) 25 MG tablet Take by mouth.    . thyroid (NP THYROID) 30 MG tablet Take 1 tablet (30 mg total) by mouth daily before breakfast. 30 tablet 3  . traZODone (DESYREL) 50 MG tablet Take 1 tablet (50 mg total) by mouth at bedtime as needed and may repeat dose one time if needed for sleep. 30 tablet 0  . [DISCONTINUED] chlorthalidone (HYGROTON) 50 MG tablet Take 0.5 tablets (25 mg total) by mouth daily. 90 tablet  0  . [DISCONTINUED] potassium chloride SA (KLOR-CON) 20 MEQ tablet Take 1 tablet (20 mEq total) by mouth daily. 15 tablet 0  . [DISCONTINUED] promethazine (PHENERGAN) 25 MG tablet Take 1 tablet (25 mg total) by mouth every 12 (twelve) hours as needed for nausea or vomiting. 15 tablet 0   Social History   Socioeconomic History  . Marital status: Married    Spouse name: Not on file  . Number of children: 0  . Years of education: Not on file  . Highest education level: Not on file  Occupational History  . Occupation: research associate/drug development  Tobacco Use  . Smoking status: Former Smoker    Types: Cigarettes    Quit date: 12/30/1982    Years since quitting: 37.6  .  Smokeless tobacco: Never Used  Vaping Use  . Vaping Use: Never used  Substance and Sexual Activity  . Alcohol use: Not Currently    Alcohol/week: 0.0 standard drinks  . Drug use: No  . Sexual activity: Not Currently  Other Topics Concern  . Not on file  Social History Narrative   Married for 3 years.Lives with husband.Bachelors in Education officer, museum.   Social Determinants of Health   Financial Resource Strain:   . Difficulty of Paying Living Expenses: Not on file  Food Insecurity:   . Worried About Charity fundraiser in the Last Year: Not on file  . Ran Out of Food in the Last Year: Not on file  Transportation Needs:   . Lack of Transportation (Medical): Not on file  . Lack of Transportation (Non-Medical): Not on file  Physical Activity:   . Days of Exercise per Week: Not on file  . Minutes of Exercise per Session: Not on file  Stress:   . Feeling of Stress : Not on file  Social Connections:   . Frequency of Communication with Friends and Family: Not on file  . Frequency of Social Gatherings with Friends and Family: Not on file  . Attends Religious Services: Not on file  . Active Member of Clubs or Organizations: Not on file  . Attends Archivist Meetings: Not on file  . Marital Status: Not on file  Intimate Partner Violence:   . Fear of Current or Ex-Partner: Not on file  . Emotionally Abused: Not on file  . Physically Abused: Not on file  . Sexually Abused: Not on file   Family History  Problem Relation Age of Onset  . Hypertension Father   . Hyperlipidemia Father   . Diabetes Father   . Hypertension Mother   . Hyperlipidemia Mother   . Diabetes Mellitus II Mother   . Congestive Heart Failure Mother   . Hypertension Sister   . Diabetes Sister   . Hypertension Brother   . Diabetes Maternal Grandmother   . Lupus Paternal Grandmother   . Colon cancer Neg Hx   . Esophageal cancer Neg Hx   . Gastric cancer Neg Hx     OBJECTIVE:  Vitals:   09/01/20 1359    BP: 116/81  Pulse: 67  Resp: 20  Temp: 98.8 F (37.1 C)  SpO2: 95%     General appearance: alert; appears fatigued, but nontoxic; speaking in full sentences and tolerating own secretions HEENT: NCAT; Ears: EACs clear, TMs pearly gray; Eyes: PERRL.  EOM grossly intact. Nose: nares patent without rhinorrhea, Throat: oropharynx clear, tonsils non erythematous or enlarged, uvula midline  Neck: supple without LAD Lungs: unlabored respirations, symmetrical air entry; cough: absent;  no respiratory distress; CTAB Heart: regular rate and rhythm.   Skin: warm and dry Psychological: alert and cooperative; normal mood and affect  DIAGNOSTIC STUDIES:  DG Chest 2 View  Result Date: 09/01/2020 CLINICAL DATA:  Cough. EXAM: CHEST - 2 VIEW COMPARISON:  March 17, 2020. FINDINGS: The heart size and mediastinal contours are within normal limits. Both lungs are clear. The visualized skeletal structures are unremarkable. IMPRESSION: No active cardiopulmonary disease. Electronically Signed   By: Marijo Conception M.D.   On: 09/01/2020 14:43    X-rays negative for cardiopulmonary disease  I have reviewed the x-rays myself and the radiologist interpretation. I am in agreement with the radiologist interpretation.     ASSESSMENT & PLAN:  1. Viral URI with cough   2. Other fatigue     Meds ordered this encounter  Medications  . benzonatate (TESSALON) 100 MG capsule    Sig: Take 1 capsule (100 mg total) by mouth every 8 (eight) hours.    Dispense:  21 capsule    Refill:  0    Order Specific Question:   Supervising Provider    Answer:   Raylene Everts [[id number]]  . predniSONE (STERAPRED UNI-PAK 21 TAB) 10 MG (21) TBPK tablet    Sig: Take by mouth daily. Take 6 tabs by mouth daily  for 2 days, then 5 tabs for 2 days, then 4 tabs for 2 days, then 3 tabs for 2 days, 2 tabs for 2 days, then 1 tab by mouth daily for 2 days    Dispense:  42 tablet    Refill:  0    Order Specific Question:   Supervising  Provider    Answer:   Raylene Everts [[id number]]  . dexamethasone (DECADRON) injection 10 mg    CXR negative for cardiopulmonary disease Blood work ordered.  If abnormal follow up with PCP next week as scheduled Declines decadron shot Get plenty of rest and push fluids Tessalon Perles prescribed for cough Prednisone prescribed.  Take as directed and to completion Use OTC zyrtec for nasal congestion, runny nose, and/or sore throat Use OTC flonase for nasal congestion and runny nose Use medications daily for symptom relief Use OTC medications like ibuprofen or tylenol as needed fever or pain Call or go to the ED if you have any new or worsening symptoms such as fever, worsening cough, shortness of breath, chest tightness, chest pain, turning blue, changes in mental status, etc...   Reviewed expectations re: course of current medical issues. Questions answered. Outlined signs and symptoms indicating need for more acute intervention. Patient verbalized understanding. After Visit Summary given.         Lestine Box, PA-C 09/01/20 [place], [place], PA-C 09/01/20 1515

## 2020-09-02 LAB — CBC WITH DIFFERENTIAL/PLATELET
Basophils Absolute: 0.1 10*3/uL (ref 0.0–0.2)
Basos: 1 %
EOS (ABSOLUTE): 0.3 10*3/uL (ref 0.0–0.4)
Eos: 3 %
Hematocrit: 44.2 % (ref 34.0–46.6)
Hemoglobin: 14.8 g/dL (ref 11.1–15.9)
Immature Grans (Abs): 0 10*3/uL (ref 0.0–0.1)
Immature Granulocytes: 0 %
Lymphocytes Absolute: 1.8 10*3/uL (ref 0.7–3.1)
Lymphs: 18 %
MCH: 31 pg (ref 26.6–33.0)
MCHC: 33.5 g/dL (ref 31.5–35.7)
MCV: 93 fL (ref 79–97)
Monocytes Absolute: 0.8 10*3/uL (ref 0.1–0.9)
Monocytes: 9 %
Neutrophils Absolute: 6.7 10*3/uL (ref 1.4–7.0)
Neutrophils: 69 %
Platelets: 279 10*3/uL (ref 150–450)
RBC: 4.77 x10E6/uL (ref 3.77–5.28)
RDW: 12.2 % (ref 11.7–15.4)
WBC: 9.7 10*3/uL (ref 3.4–10.8)

## 2020-09-02 LAB — BASIC METABOLIC PANEL
BUN/Creatinine Ratio: 17 (ref 9–23)
BUN: 17 mg/dL (ref 6–24)
CO2: 28 mmol/L (ref 20–29)
Calcium: 10.2 mg/dL (ref 8.7–10.2)
Chloride: 101 mmol/L (ref 96–106)
Creatinine, Ser: 0.98 mg/dL (ref 0.57–1.00)
GFR calc Af Amer: 75 mL/min/{1.73_m2} (ref >59)
GFR calc non Af Amer: 65 mL/min/{1.73_m2} (ref >59)
Glucose: 131 mg/dL — ABNORMAL HIGH (ref 65–99)
Potassium: 4.2 mmol/L (ref 3.5–5.2)
Sodium: 142 mmol/L (ref 134–144)

## 2020-09-05 ENCOUNTER — Telehealth (INDEPENDENT_AMBULATORY_CARE_PROVIDER_SITE_OTHER): Payer: Self-pay

## 2020-09-05 NOTE — Telephone Encounter (Signed)
Sydney Taylor is calling stating she was seen in Urgent Care last week and they put her on a Pred Pak she is currently on day 5 and her blood sugar is running 400 and she is asking should she stop the prednisone, please advise?

## 2020-09-05 NOTE — Telephone Encounter (Signed)
Sydney Taylor is aware

## 2020-09-05 NOTE — Telephone Encounter (Signed)
Yes, she should stop the prednisone.

## 2020-09-07 ENCOUNTER — Telehealth (INDEPENDENT_AMBULATORY_CARE_PROVIDER_SITE_OTHER): Payer: No Typology Code available for payment source | Admitting: Internal Medicine

## 2020-09-08 ENCOUNTER — Encounter (INDEPENDENT_AMBULATORY_CARE_PROVIDER_SITE_OTHER): Payer: Self-pay | Admitting: Nurse Practitioner

## 2020-09-10 ENCOUNTER — Other Ambulatory Visit (INDEPENDENT_AMBULATORY_CARE_PROVIDER_SITE_OTHER): Payer: Self-pay | Admitting: Nurse Practitioner

## 2020-09-10 DIAGNOSIS — E1100 Type 2 diabetes mellitus with hyperosmolarity without nonketotic hyperglycemic-hyperosmolar coma (NKHHC): Secondary | ICD-10-CM

## 2020-09-11 ENCOUNTER — Other Ambulatory Visit (INDEPENDENT_AMBULATORY_CARE_PROVIDER_SITE_OTHER): Payer: Self-pay | Admitting: Internal Medicine

## 2020-09-11 MED ORDER — LOSARTAN POTASSIUM 25 MG PO TABS: 25.0000 mg | ORAL_TABLET | Freq: Every day | ORAL | 0 refills | Status: DC

## 2020-09-12 ENCOUNTER — Ambulatory Visit (INDEPENDENT_AMBULATORY_CARE_PROVIDER_SITE_OTHER): Payer: No Typology Code available for payment source | Admitting: Nurse Practitioner

## 2020-09-12 ENCOUNTER — Encounter (INDEPENDENT_AMBULATORY_CARE_PROVIDER_SITE_OTHER): Payer: Self-pay | Admitting: Nurse Practitioner

## 2020-09-12 ENCOUNTER — Other Ambulatory Visit: Payer: Self-pay

## 2020-09-12 VITALS — BP 122/87 | HR 71 | Resp 18 | Ht 70.0 in | Wt 236.0 lb

## 2020-09-12 DIAGNOSIS — R5381 Other malaise: Secondary | ICD-10-CM

## 2020-09-12 DIAGNOSIS — R42 Dizziness and giddiness: Secondary | ICD-10-CM | POA: Diagnosis not present

## 2020-09-12 DIAGNOSIS — J00 Acute nasopharyngitis [common cold]: Secondary | ICD-10-CM

## 2020-09-12 DIAGNOSIS — R5383 Other fatigue: Secondary | ICD-10-CM

## 2020-09-12 DIAGNOSIS — E1165 Type 2 diabetes mellitus with hyperglycemia: Secondary | ICD-10-CM

## 2020-09-12 DIAGNOSIS — E785 Hyperlipidemia, unspecified: Secondary | ICD-10-CM

## 2020-09-12 MED ORDER — ATORVASTATIN CALCIUM 10 MG PO TABS: 10.0000 mg | ORAL_TABLET | Freq: Every day | ORAL | 0 refills | Status: DC

## 2020-09-12 MED ORDER — FLUTICASONE PROPIONATE 50 MCG/ACT NA SUSP: 2.0000 | Freq: Every day | NASAL | 6 refills | Status: DC

## 2020-09-12 NOTE — Progress Notes (Signed)
Subjective:  Patient ID: [name], female    DOB: Dec 12, 1966  Age: 53 y.o. MRN: [id number]  CC:  Chief Complaint  Patient presents with  . Sore Throat  . Other    Dizziness  . Hyperlipidemia  . Diabetes      HPI  This patient arrives today for the above.  Sore throat/fatigue/malaise: She tells me for approximately 3 weeks she has been having malaise, sore throat, fatigue, tenderness and pressure over her cheeks.  She tells me she originally went to [facility] urgent care and was tested for Covid which came back negative.  She ended up being diagnosed with viral infection and then returned to urgent care a bit later and was prescribed doxycycline.  She then went and saw ear nose and throat specialist outside the [facility] network and the ENT prescribed clindamycin.  The patient tells me she is got about 3 to 4 days left of the clindamycin.  She tells me she is planning on having a CT scan of her sinus cavities for further evaluation.  She tells me that the ENT tested her again for Covid which was negative, RSV, and flu.  She has not yet been tested for strep.  She is very discouraged because she has not gotten a clear answer as to what could be causing her symptoms.  She does have a history of fibromyalgia, but does not follow-up with rheumatologist on a regular basis at this time.  So far work-up has shown normal white blood cells, normal metabolic panel, normal chest x-ray, negative for COVID-19, she reports she was negative for flu and RSV.  As far as health maintenance goes she tells me she is due for Pap smear in about 2 weeks which she plans on completing.  She also tells me she had a colonoscopy approximately 4 years ago and was told that everything was clear and she was due to be rechecked in 10 years.  She was told to start desiccated thyroid for the off label treatment of her fatigue and she tells me she started this approximately 2 weeks ago and seems to be tolerating the medication  fairly well.  Dizziness: At last office visit with myself she was experiencing some dizziness.  At that time she was recommended to undergo cardiac echocardiogram and MRI of the brain.  Blood work was also collected.  No significant abnormalities was noted on any of the work-up.  She was referred to cardiology and cardiology could not find any reason for her symptoms either.  Orthostatic vital signs were negative.  MRI did show microvascular changes.  Hyperlipidemia/Diabetes: She also has a history of diabetes and hyperlipidemia.  She continues on glipizide, but is no longer on her by durian.  Last A1c was collected about 2 months ago and it was 6.4.  She is not on statin therapy currently.  She is on ARB therapy.  Past Medical History:  Diagnosis Date  . Anemia   . Anxiety   . Arrhythmia   . Bipolar depression ([place]) 12/28/2019  . Chronic headaches   . Depression   . Diabetes mellitus without complication ([place])   . Fibromyalgia   . GERD (gastroesophageal reflux disease)   . HLD (hyperlipidemia)   . Hypertension   . Insect bite   . Prediabetes   . Sleep apnea   . Urinary incontinence       Family History  Problem Relation Age of Onset  . Hypertension Father   . Hyperlipidemia  Father   . Diabetes Father   . Hypertension Mother   . Hyperlipidemia Mother   . Diabetes Mellitus II Mother   . Congestive Heart Failure Mother   . Hypertension Sister   . Diabetes Sister   . Hypertension Brother   . Diabetes Maternal Grandmother   . Lupus Paternal Grandmother   . Colon cancer Neg Hx   . Esophageal cancer Neg Hx   . Gastric cancer Neg Hx     Social History   Social History Narrative   Married for 3 years.Lives with husband.Bachelors in Education officer, museum.   Social History   Tobacco Use  . Smoking status: Former Smoker    Types: Cigarettes    Quit date: 12/30/1982    Years since quitting: 37.7  . Smokeless tobacco: Never Used  Substance Use Topics  . Alcohol use: Not Currently     Alcohol/week: 0.0 standard drinks     Current Meds  Medication Sig  . ALPRAZolam (XANAX) 0.5 MG tablet Take 0.5 mg by mouth daily as needed.  . benzonatate (TESSALON) 100 MG capsule Take 1 capsule (100 mg total) by mouth every 8 (eight) hours.  . chlorthalidone (HYGROTON) 25 MG tablet Take 1 tablet (25 mg total) by mouth daily.  . Cholecalciferol (VITAMIN D-3) 125 MCG (5000 UT) TABS Take 2 tablets by mouth daily.  Marland Kitchen glipiZIDE (GLUCOTROL) 10 MG tablet TAKE 1 TABLET (10 MG TOTAL) BY MOUTH 2 (TWO) TIMES DAILY BEFORE A MEAL.  . hydrOXYzine (ATARAX/VISTARIL) 25 MG tablet Take 1 tablet (25 mg total) by mouth 3 (three) times daily as needed for anxiety.  Marland Kitchen KLOR-CON M20 20 MEQ tablet TAKE 2 TABLETS BY MOUTH IN THE MORNING AND 1 TABLET BY MOUTH IN THE EVENING  . losartan (COZAAR) 25 MG tablet Take 1 tablet (25 mg total) by mouth daily.  . metoprolol tartrate (LOPRESSOR) 50 MG tablet Take 1 tablet (50 mg total) by mouth 2 (two) times daily.  . pantoprazole (PROTONIX) 40 MG tablet Take 1 tablet (40 mg total) by mouth 2 (two) times daily.  . sertraline (ZOLOFT) 25 MG tablet Take by mouth.  . thyroid (NP THYROID) 30 MG tablet Take 1 tablet (30 mg total) by mouth daily before breakfast.  . traZODone (DESYREL) 50 MG tablet Take 1 tablet (50 mg total) by mouth at bedtime as needed and may repeat dose one time if needed for sleep.  . [DISCONTINUED] cetirizine-pseudoephedrine (ZYRTEC-D) 5-120 MG tablet Take 1 tablet by mouth daily.  . [DISCONTINUED] predniSONE (STERAPRED UNI-PAK 21 TAB) 10 MG (21) TBPK tablet Take by mouth daily. Take 6 tabs by mouth daily  for 2 days, then 5 tabs for 2 days, then 4 tabs for 2 days, then 3 tabs for 2 days, 2 tabs for 2 days, then 1 tab by mouth daily for 2 days    ROS:  See HPI   Objective:   Today's Vitals: BP 122/87 (BP Location: Right Arm, Patient Position: Sitting, Cuff Size: Normal)   Pulse 71   Resp 18   Ht 5\' 10"  (1.778 m)   Wt 236 lb (107 kg)   LMP  (LMP  Unknown)   SpO2 97%   BMI 33.86 kg/m  Vitals with BMI 09/12/2020 09/01/2020 08/28/2020  Height 5\' 10"  - -  Weight 236 lbs - -  BMI 35.46 - -  Systolic 568 127 517  Diastolic 87 81 78  Pulse 71 67 72  Some encounter information is confidential and restricted. Go to Review Flowsheets activity  to see all data.     Physical Exam Vitals reviewed.  Constitutional:      General: She is not in acute distress.    Appearance: Normal appearance.  HENT:     Head: Normocephalic and atraumatic.     Nose:     Right Turbinates: Enlarged and swollen.  Neck:     Vascular: No carotid bruit.  Cardiovascular:     Rate and Rhythm: Normal rate and regular rhythm.     Pulses: Normal pulses.     Heart sounds: Normal heart sounds.  Pulmonary:     Effort: Pulmonary effort is normal.     Breath sounds: Normal breath sounds.  Lymphadenopathy:     Cervical: No cervical adenopathy.  Skin:    General: Skin is warm and dry.  Neurological:     General: No focal deficit present.     Mental Status: She is alert and oriented to person, place, and time.  Psychiatric:        Mood and Affect: Mood normal.        Behavior: Behavior normal.        Judgment: Judgment normal.    Rapid strep throat test: Negative      Assessment and Plan   1. Dizziness   2. Type 2 diabetes mellitus with hyperglycemia, without long-term current use of insulin (HCC)   3. Hyperlipidemia, unspecified hyperlipidemia type   4. Acute rhinitis   5. Malaise and fatigue      Plan: 1.  I currently do not have a good explanation for her symptoms but she does tell me her symptoms persist.  We will refer her to neurology for further evaluation. 2.  She will continue on her current medication regimen as prescribed.  Will be due for A1c recheck in approximately 1 month. 3.  Did discuss the importance of statin therapy to control her cholesterol especially as she is a type II diabetic.  She is agreeable to trying the medication and we  will get repeat lipid panel and metabolic panel at next office visit. 4.,  5.  Also not sure of current etiology of her symptoms.  Turbinates to right nare were red and, so I will order Flonase nasal spray and will collect generalized inflammatory markers for further evaluation.  May consider referring to rheumatology for further evaluation   Tests ordered Orders Placed This Encounter  Procedures  . ANA  . Rheumatoid Factor  . C-reactive Protein  . Sedimentation Rate  . Ambulatory referral to Neurology      Meds ordered this encounter  Medications  . atorvastatin (LIPITOR) 10 MG tablet    Sig: Take 1 tablet (10 mg total) by mouth daily.    Dispense:  90 tablet    Refill:  0    Order Specific Question:   Supervising Provider    Answer:   Hurshel Party C [[id number]]  . fluticasone (FLONASE) 50 MCG/ACT nasal spray    Sig: Place 2 sprays into both nostrils daily.    Dispense:  16 g    Refill:  6    Order Specific Question:   Supervising Provider    Answer:   Doree Albee [[id number]]    Patient to follow-up in 6 weeks for close follow-up as well as to have repeat lipid panel, CMP, and thyroid levels checked.  Ailene Ards, NP

## 2020-09-13 LAB — ANA: Anti Nuclear Antibody (ANA): NEGATIVE

## 2020-09-13 LAB — RHEUMATOID FACTOR: Rheumatoid fact SerPl-aCnc: <14 IU/mL (ref <14)

## 2020-09-13 LAB — C-REACTIVE PROTEIN: CRP: 3.4 mg/L (ref <8.0)

## 2020-09-13 LAB — SEDIMENTATION RATE: Sed Rate: 9 mm/h (ref 0–30)

## 2020-09-14 ENCOUNTER — Ambulatory Visit: Payer: No Typology Code available for payment source | Admitting: Nurse Practitioner

## 2020-09-21 ENCOUNTER — Encounter (INDEPENDENT_AMBULATORY_CARE_PROVIDER_SITE_OTHER): Payer: Self-pay | Admitting: Internal Medicine

## 2020-09-21 ENCOUNTER — Other Ambulatory Visit: Payer: Self-pay

## 2020-09-21 ENCOUNTER — Ambulatory Visit (INDEPENDENT_AMBULATORY_CARE_PROVIDER_SITE_OTHER): Payer: No Typology Code available for payment source | Admitting: Internal Medicine

## 2020-09-21 VITALS — BP 134/77 | HR 78 | Temp 97.2°F | Resp 18 | Ht 70.0 in | Wt 238.0 lb

## 2020-09-21 DIAGNOSIS — R5381 Other malaise: Secondary | ICD-10-CM | POA: Diagnosis not present

## 2020-09-21 DIAGNOSIS — R5383 Other fatigue: Secondary | ICD-10-CM

## 2020-09-21 MED ORDER — NP THYROID 60 MG PO TABS: 60.0000 mg | ORAL_TABLET | Freq: Every day | ORAL | 3 refills | Status: DC

## 2020-09-21 NOTE — Progress Notes (Signed)
Metrics: Intervention Frequency ACO  Documented Smoking Status Yearly  Screened one or more times in 24 months  Cessation Counseling or  Active cessation medication Past 24 months  Past 24 months   Guideline developer: UpToDate (See UpToDate for funding source) Date Released: 2014       Wellness Office Visit  Subjective:  Patient ID: Sydney Taylor, female    DOB: [birth year]  Age: 54 y.o. MRN: [id number]  CC: Fatigue. HPI  This lady comes in because she still feels very fatigued.  She has been diagnosed with some sort of viral illness.  She apparently had a CT scan of her sinuses in [place] and there is no evidence of sinus infection.  She has been treated with at least 2 courses of antibiotics without any benefit. She is taking a low-dose NP thyroid and I see that her T3 was suboptimal. She describes some myalgias in all of her body. Past Medical History:  Diagnosis Date  . Anemia   . Anxiety   . Arrhythmia   . Bipolar depression ([place]) 12/28/2019  . Chronic headaches   . Depression   . Diabetes mellitus without complication ([place])   . Fibromyalgia   . GERD (gastroesophageal reflux disease)   . HLD (hyperlipidemia)   . Hypertension   . Insect bite   . Prediabetes   . Sleep apnea   . Urinary incontinence    Past Surgical History:  Procedure Laterality Date  . ESOPHAGOGASTRODUODENOSCOPY N/A 05/12/2020   Procedure: ESOPHAGOGASTRODUODENOSCOPY (EGD);  Surgeon: Daneil Dolin, MD;  [facility];  Service: Endoscopy;  Laterality: N/A;  11:15am  . Joint fusion on foot Right   . MALONEY DILATION N/A 05/12/2020   Procedure: Venia Minks DILATION;  Surgeon: Daneil Dolin, MD;  [facility];  Service: Endoscopy;  Laterality: N/A;  . NASAL SEPTUM SURGERY       Family History  Problem Relation Age of Onset  . Hypertension Father   . Hyperlipidemia Father   . Diabetes Father   . Hypertension Mother   . Hyperlipidemia Mother   . Diabetes Mellitus II Mother     . Congestive Heart Failure Mother   . Hypertension Sister   . Diabetes Sister   . Hypertension Brother   . Diabetes Maternal Grandmother   . Lupus Paternal Grandmother   . Colon cancer Neg Hx   . Esophageal cancer Neg Hx   . Gastric cancer Neg Hx     Social History   Social History Narrative   Married for 3 years.Lives with husband.Bachelors in Education officer, museum.   Social History   Tobacco Use  . Smoking status: Former Smoker    Types: Cigarettes    Quit date: 12/30/1982    Years since quitting: 37.7  . Smokeless tobacco: Never Used  Substance Use Topics  . Alcohol use: Not Currently    Alcohol/week: 0.0 standard drinks    Current Meds  Medication Sig  . ALPRAZolam (XANAX) 0.5 MG tablet Take 0.5 mg by mouth daily as needed.  Marland Kitchen atorvastatin (LIPITOR) 10 MG tablet Take 1 tablet (10 mg total) by mouth daily.  . benzonatate (TESSALON) 100 MG capsule Take 1 capsule (100 mg total) by mouth every 8 (eight) hours.  . chlorthalidone (HYGROTON) 25 MG tablet Take 1 tablet (25 mg total) by mouth daily.  . Cholecalciferol (VITAMIN D-3) 125 MCG (5000 UT) TABS Take 2 tablets by mouth daily.  . fluticasone (FLONASE) 50 MCG/ACT nasal spray Place 2 sprays into both nostrils  daily.  . glipiZIDE (GLUCOTROL) 10 MG tablet TAKE 1 TABLET (10 MG TOTAL) BY MOUTH 2 (TWO) TIMES DAILY BEFORE A MEAL.  . hydrOXYzine (ATARAX/VISTARIL) 25 MG tablet Take 1 tablet (25 mg total) by mouth 3 (three) times daily as needed for anxiety.  Marland Kitchen KLOR-CON M20 20 MEQ tablet TAKE 2 TABLETS BY MOUTH IN THE MORNING AND 1 TABLET BY MOUTH IN THE EVENING  . lamoTRIgine 100 MG TBDP Take by mouth as directed.   Marland Kitchen losartan (COZAAR) 25 MG tablet Take 1 tablet (25 mg total) by mouth daily.  . metoprolol tartrate (LOPRESSOR) 50 MG tablet Take 1 tablet (50 mg total) by mouth 2 (two) times daily.  . pantoprazole (PROTONIX) 40 MG tablet Take 1 tablet (40 mg total) by mouth 2 (two) times daily.  . sertraline (ZOLOFT) 25 MG tablet Take by mouth.   . thyroid (NP THYROID) 30 MG tablet Take 1 tablet (30 mg total) by mouth daily before breakfast.  . traZODone (DESYREL) 50 MG tablet Take 1 tablet (50 mg total) by mouth at bedtime as needed and may repeat dose one time if needed for sleep.      Depression screen [facility] 2/9 07/20/2020 04/06/2020 03/27/2020  Decreased Interest 0 0 0  Down, Depressed, Hopeless 3 0 0  PHQ - 2 Score 3 0 0  Altered sleeping 3 - -  Tired, decreased energy 3 - -  Change in appetite 3 - -  Feeling bad or failure about yourself  3 - -  Trouble concentrating 3 - -  Moving slowly or fidgety/restless 0 - -  Suicidal thoughts 3 - -  PHQ-9 Score 21 - -     Objective:   Today's Vitals: BP 134/77   Pulse 78   Temp (!) 97.2 F (36.2 C) (Temporal)   Resp 18   Ht 5\' 10"  (1.778 m)   Wt 238 lb (108 kg)   LMP  (LMP Unknown)   SpO2 98%   BMI 34.15 kg/m  Vitals with BMI 09/21/2020 09/12/2020 09/01/2020  Height 5\' 10"  5\' 10"  -  Weight 238 lbs 236 lbs -  BMI 32.20 25.42 -  Systolic 706 237 628  Diastolic 77 87 81  Pulse 78 71 67  Some encounter information is confidential and restricted. Go to Review Flowsheets activity to see all data.     Physical Exam   She is afebrile and oxygen saturation on room air is normal.  Blood pressure is in a good range.  She remains obese.    Assessment   1. Malaise and fatigue       Tests ordered No orders of the defined types were placed in this encounter.    Plan: 1. She may well be describing chronic fatigue syndrome based on her history.  She has had symptoms for a month that have been quite severe but even prior to that she had constant background fatigue.  I am going to increase her thyroid dose as a trial, off label, for what I think her symptoms of thyroid deficiency and also may well help chronic fatigue syndrome. 2. Follow-up soon in the next 2 to 3 weeks time to see how she is doing.   Meds ordered this encounter  Medications  . NP THYROID 60 MG tablet     Sig: Take 1 tablet (60 mg total) by mouth daily before breakfast.    Dispense:  30 tablet    Refill:  3    Gredmarie Delange Luther Parody, MD

## 2020-09-26 ENCOUNTER — Encounter (INDEPENDENT_AMBULATORY_CARE_PROVIDER_SITE_OTHER): Payer: Self-pay | Admitting: Nurse Practitioner

## 2020-10-02 ENCOUNTER — Encounter (INDEPENDENT_AMBULATORY_CARE_PROVIDER_SITE_OTHER): Payer: Self-pay | Admitting: Internal Medicine

## 2020-10-10 ENCOUNTER — Ambulatory Visit (INDEPENDENT_AMBULATORY_CARE_PROVIDER_SITE_OTHER): Payer: No Typology Code available for payment source | Admitting: Nurse Practitioner

## 2020-10-16 ENCOUNTER — Encounter (INDEPENDENT_AMBULATORY_CARE_PROVIDER_SITE_OTHER): Payer: Self-pay | Admitting: Internal Medicine

## 2020-10-16 ENCOUNTER — Other Ambulatory Visit: Payer: Self-pay

## 2020-10-16 ENCOUNTER — Ambulatory Visit (INDEPENDENT_AMBULATORY_CARE_PROVIDER_SITE_OTHER): Payer: No Typology Code available for payment source | Admitting: Internal Medicine

## 2020-10-16 VITALS — BP 120/74 | HR 64 | Temp 97.7°F | Ht 70.0 in | Wt 241.0 lb

## 2020-10-16 DIAGNOSIS — R5383 Other fatigue: Secondary | ICD-10-CM

## 2020-10-16 DIAGNOSIS — R5381 Other malaise: Secondary | ICD-10-CM | POA: Diagnosis not present

## 2020-10-16 DIAGNOSIS — E2839 Other primary ovarian failure: Secondary | ICD-10-CM | POA: Diagnosis not present

## 2020-10-16 MED ORDER — PROGESTERONE 200 MG PO CAPS: 200.0000 mg | ORAL_CAPSULE | Freq: Every day | ORAL | 3 refills | Status: DC

## 2020-10-16 MED ORDER — ESTRADIOL 0.5 MG PO TABS: 0.5000 mg | ORAL_TABLET | Freq: Every day | ORAL | 3 refills | Status: DC

## 2020-10-16 NOTE — Progress Notes (Signed)
Metrics: Intervention Frequency ACO  Documented Smoking Status Yearly  Screened one or more times in 24 months  Cessation Counseling or  Active cessation medication Past 24 months  Past 24 months   Guideline developer: UpToDate (See UpToDate for funding source) Date Released: 2014       Wellness Office Visit  Subjective:  Patient ID: Sydney Taylor, female    DOB: 01-15-1966  Age: 54 y.o. MRN: [id number]  CC: Malaise/fatigue HPI  This patient did not tolerate the higher dose of NP thyroid and apparently made her feel very jittery.  She says the the feeling she has of severe fatigue possibly started when she started taking NP thyroid in the first place.  She is now back down to taking NP thyroid 30 mg daily and would like her thyroid test checked. She is also postmenopausal and we had checked estradiol and progesterone levels previously which were very low as expected.  Her last menstrual cycle was more than 3 years ago.  She has poor sleep and takes trazodone. Past Medical History:  Diagnosis Date  . Anemia   . Anxiety   . Arrhythmia   . Bipolar depression ([place]) 12/28/2019  . Chronic headaches   . Depression   . Diabetes mellitus without complication ([place])   . Fibromyalgia   . GERD (gastroesophageal reflux disease)   . HLD (hyperlipidemia)   . Hypertension   . Insect bite   . Prediabetes   . Sleep apnea   . Urinary incontinence    Past Surgical History:  Procedure Laterality Date  . ESOPHAGOGASTRODUODENOSCOPY N/A 05/12/2020   Procedure: ESOPHAGOGASTRODUODENOSCOPY (EGD);  Surgeon: Daneil Dolin, MD;  [facility];  Service: Endoscopy;  Laterality: N/A;  11:15am  . Joint fusion on foot Right   . MALONEY DILATION N/A 05/12/2020   Procedure: Venia Minks DILATION;  Surgeon: Daneil Dolin, MD;  [facility];  Service: Endoscopy;  Laterality: N/A;  . NASAL SEPTUM SURGERY       Family History  Problem Relation Age of Onset  . Hypertension Father   .  Hyperlipidemia Father   . Diabetes Father   . Hypertension Mother   . Hyperlipidemia Mother   . Diabetes Mellitus II Mother   . Congestive Heart Failure Mother   . Hypertension Sister   . Diabetes Sister   . Hypertension Brother   . Diabetes Maternal Grandmother   . Lupus Paternal Grandmother   . Colon cancer Neg Hx   . Esophageal cancer Neg Hx   . Gastric cancer Neg Hx     Social History   Social History Narrative   Married for 3 years.Lives with husband.Bachelors in Education officer, museum.   Social History   Tobacco Use  . Smoking status: Former Smoker    Types: Cigarettes    Quit date: 12/30/1982    Years since quitting: 37.8  . Smokeless tobacco: Never Used  Substance Use Topics  . Alcohol use: Not Currently    Alcohol/week: 0.0 standard drinks    Current Meds  Medication Sig  . ALPRAZolam (XANAX) 0.5 MG tablet Take 0.5 mg by mouth daily as needed.  Marland Kitchen atorvastatin (LIPITOR) 10 MG tablet Take 1 tablet (10 mg total) by mouth daily.  Marland Kitchen azelastine (ASTELIN) 0.1 % nasal spray Place 1 spray into both nostrils 2 (two) times daily.  Marland Kitchen buPROPion (WELLBUTRIN XL) 150 MG 24 hr tablet Take 150 mg by mouth at bedtime.  . chlorthalidone (HYGROTON) 25 MG tablet Take 1 tablet (25 mg  total) by mouth daily.  . Cholecalciferol (VITAMIN D-3) 125 MCG (5000 UT) TABS Take 2 tablets by mouth daily.  Marland Kitchen escitalopram (LEXAPRO) 20 MG tablet Take 20 mg by mouth daily.  . fluticasone (FLONASE) 50 MCG/ACT nasal spray Place 2 sprays into both nostrils daily.  Marland Kitchen glipiZIDE (GLUCOTROL) 10 MG tablet TAKE 1 TABLET (10 MG TOTAL) BY MOUTH 2 (TWO) TIMES DAILY BEFORE A MEAL.  . hydrOXYzine (ATARAX/VISTARIL) 25 MG tablet Take 1 tablet (25 mg total) by mouth 3 (three) times daily as needed for anxiety.  Marland Kitchen KLOR-CON M20 20 MEQ tablet TAKE 2 TABLETS BY MOUTH IN THE MORNING AND 1 TABLET BY MOUTH IN THE EVENING  . lamoTRIgine 100 MG TBDP Take by mouth as directed.   Marland Kitchen losartan (COZAAR) 25 MG tablet Take 1 tablet (25 mg total) by  mouth daily.  . metoprolol tartrate (LOPRESSOR) 50 MG tablet Take 1 tablet (50 mg total) by mouth 2 (two) times daily.  . pantoprazole (PROTONIX) 40 MG tablet Take 1 tablet (40 mg total) by mouth 2 (two) times daily.  Marland Kitchen thyroid (NP THYROID) 30 MG tablet Take 1 tablet (30 mg total) by mouth daily before breakfast.  . traZODone (DESYREL) 50 MG tablet Take 1 tablet (50 mg total) by mouth at bedtime as needed and may repeat dose one time if needed for sleep.  . [DISCONTINUED] NP THYROID 60 MG tablet Take 1 tablet (60 mg total) by mouth daily before breakfast.  . [DISCONTINUED] sertraline (ZOLOFT) 25 MG tablet Take by mouth.      Depression screen [facility] 2/9 07/20/2020 04/06/2020 03/27/2020  Decreased Interest 0 0 0  Down, Depressed, Hopeless 3 0 0  PHQ - 2 Score 3 0 0  Altered sleeping 3 - -  Tired, decreased energy 3 - -  Change in appetite 3 - -  Feeling bad or failure about yourself  3 - -  Trouble concentrating 3 - -  Moving slowly or fidgety/restless 0 - -  Suicidal thoughts 3 - -  PHQ-9 Score 21 - -     Objective:   Today's Vitals: BP 120/74   Pulse 64   Temp 97.7 F (36.5 C) (Temporal)   Ht 5\' 10"  (1.778 m)   Wt 241 lb (109.3 kg)   LMP  (LMP Unknown)   SpO2 96%   BMI 34.58 kg/m  Vitals with BMI 10/16/2020 09/21/2020 09/12/2020  Height 5\' 10"  5\' 10"  5\' 10"   Weight 241 lbs 238 lbs 236 lbs  BMI 34.58 33.29 51.88  Systolic 416 606 301  Diastolic 74 77 87  Pulse 64 78 71  Some encounter information is confidential and restricted. Go to Review Flowsheets activity to see all data.     Physical Exam   She remains obese.  Blood pressure is in a good range.    Assessment   1. Malaise and fatigue   2. Primary ovarian failure       Tests ordered Orders Placed This Encounter  Procedures  . T3, free  . T4  . TSH     Plan: 1. She will continue with NP thyroid 30 mg daily and we will check thyroid function test today. 2. Today I discussed  hormone therapy and she  is willing to try estradiol and progesterone.  I discussed the benefits and possible side effects. 3. Follow-up in 2 months to see how she is doing and we will check all the blood work then.   Meds ordered this encounter  Medications  .  estradiol (ESTRACE) 0.5 MG tablet    Sig: Take 1 tablet (0.5 mg total) by mouth daily.    Dispense:  30 tablet    Refill:  3  . progesterone (PROMETRIUM) 200 MG capsule    Sig: Take 1 capsule (200 mg total) by mouth daily.    Dispense:  30 capsule    Refill:  3    Allysia Ingles Luther Parody, MD

## 2020-10-17 LAB — T3, FREE: T3, Free: 3 pg/mL (ref 2.3–4.2)

## 2020-10-17 LAB — T4: T4, Total: 8.3 ug/dL (ref 5.1–11.9)

## 2020-10-17 LAB — TSH: TSH: 2.03 mIU/L

## 2020-10-23 NOTE — Progress Notes (Signed)
Pt read; and is following instructions.

## 2020-10-30 ENCOUNTER — Ambulatory Visit (INDEPENDENT_AMBULATORY_CARE_PROVIDER_SITE_OTHER): Payer: No Typology Code available for payment source | Admitting: Internal Medicine

## 2020-11-02 ENCOUNTER — Other Ambulatory Visit: Payer: Self-pay | Admitting: Internal Medicine

## 2020-11-02 DIAGNOSIS — H5203 Hypermetropia, bilateral: Secondary | ICD-10-CM | POA: Diagnosis not present

## 2020-11-09 ENCOUNTER — Ambulatory Visit: Payer: No Typology Code available for payment source | Admitting: Nurse Practitioner

## 2020-11-09 ENCOUNTER — Other Ambulatory Visit (INDEPENDENT_AMBULATORY_CARE_PROVIDER_SITE_OTHER): Payer: Self-pay | Admitting: Internal Medicine

## 2020-11-13 ENCOUNTER — Encounter (INDEPENDENT_AMBULATORY_CARE_PROVIDER_SITE_OTHER): Payer: Self-pay | Admitting: Internal Medicine

## 2020-11-30 ENCOUNTER — Encounter (INDEPENDENT_AMBULATORY_CARE_PROVIDER_SITE_OTHER): Payer: Self-pay | Admitting: Nurse Practitioner

## 2020-11-30 ENCOUNTER — Other Ambulatory Visit (INDEPENDENT_AMBULATORY_CARE_PROVIDER_SITE_OTHER): Payer: Self-pay | Admitting: Nurse Practitioner

## 2020-11-30 MED ORDER — METOPROLOL TARTRATE 50 MG PO TABS
50.0000 mg | ORAL_TABLET | Freq: Two times a day (BID) | ORAL | 0 refills | Status: DC
Start: 2020-11-30 — End: 2021-02-22

## 2020-12-06 ENCOUNTER — Ambulatory Visit (INDEPENDENT_AMBULATORY_CARE_PROVIDER_SITE_OTHER): Payer: No Typology Code available for payment source | Admitting: Internal Medicine

## 2020-12-07 ENCOUNTER — Ambulatory Visit (INDEPENDENT_AMBULATORY_CARE_PROVIDER_SITE_OTHER): Payer: No Typology Code available for payment source | Admitting: Nurse Practitioner

## 2020-12-08 ENCOUNTER — Other Ambulatory Visit (INDEPENDENT_AMBULATORY_CARE_PROVIDER_SITE_OTHER): Payer: Self-pay | Admitting: Internal Medicine

## 2020-12-08 ENCOUNTER — Other Ambulatory Visit (INDEPENDENT_AMBULATORY_CARE_PROVIDER_SITE_OTHER): Payer: Self-pay | Admitting: Nurse Practitioner

## 2020-12-08 DIAGNOSIS — E1165 Type 2 diabetes mellitus with hyperglycemia: Secondary | ICD-10-CM

## 2020-12-08 DIAGNOSIS — E1100 Type 2 diabetes mellitus with hyperosmolarity without nonketotic hyperglycemic-hyperosmolar coma (NKHHC): Secondary | ICD-10-CM

## 2020-12-08 DIAGNOSIS — E785 Hyperlipidemia, unspecified: Secondary | ICD-10-CM

## 2020-12-13 ENCOUNTER — Encounter: Payer: Self-pay | Admitting: Neurology

## 2020-12-13 ENCOUNTER — Ambulatory Visit: Payer: No Typology Code available for payment source | Admitting: Neurology

## 2020-12-13 VITALS — BP 113/76 | HR 71 | Ht 70.0 in | Wt 252.0 lb

## 2020-12-13 DIAGNOSIS — R2689 Other abnormalities of gait and mobility: Secondary | ICD-10-CM | POA: Diagnosis not present

## 2020-12-13 DIAGNOSIS — R2681 Unsteadiness on feet: Secondary | ICD-10-CM

## 2020-12-13 DIAGNOSIS — R29898 Other symptoms and signs involving the musculoskeletal system: Secondary | ICD-10-CM

## 2020-12-13 DIAGNOSIS — R292 Abnormal reflex: Secondary | ICD-10-CM | POA: Diagnosis not present

## 2020-12-13 NOTE — Progress Notes (Signed)
[facility]    Provider:  Dr Jaynee Eagles Requesting Provider: Ailene Ards, NP Primary Care Provider:  Doree Albee, MD  CC:  Unsteadiness   HPI:  Sydney Taylor is a 54 y.o. female here as requested by Ailene Ards, NP for dizziness. PMHx sleep panea, prediabetes, migraine, HTN, HLD, Fibromyalgia, depression, bipolar disease, anxiety, anemia. Patient says the main problem she has is unsteadiness in er legs, better if walking fast, no falls, no weakness, been ongoing "a while" maybe a year, she feels she has to concentrate to go ina straight line, Uncle with parkinson's, no tremors, no stiffness, she has Pain "pretty much constantly" but no neck pain or shooting pain in the arms or legs, she has not been dropping things, No significant numbness or tingling, no tremor, no falls. Episodes are random, she does not feel it today. Mostly if she Is walking slowly, walking fast is better. No problems walking. Not lightheaded. No other focal neurologic deficits, associated symptoms, inciting events or modifiable factors.  Reviewed notes, labs and imaging from outside physicians, which showed o  B12 300, cbc/bmp unremarkable, ana negative, RF neg, crp normal, sed rate normal, tsh   MRI of the brain wo contrast 08/05/2020: personally reviewed images and agree with findings below  IMPRESSION: Negative MRI head no acute abnormality  Mild hyperintensity in the pons and in the white matter which is likely due to mild chronic microvascular ischemia.  Review of Systems: Patient complains of symptoms per HPI as well as the following symptoms: dizziness, insomnia, headache, feeling hot, joint pain, aching muscles, fatigue, blurriness. Pertinent negatives and positives per HPI. All others negative.   Social History   Socioeconomic History  . Marital status: Married    Spouse name: Not on file  . Number of children: 0  . Years of education: Not on file  . Highest education level:  Bachelor's degree (e.g., BA, AB, BS)  Occupational History  . Occupation: research associate/drug development  Tobacco Use  . Smoking status: Former Smoker    Types: Cigarettes    Quit date: 12/30/1982    Years since quitting: 37.9  . Smokeless tobacco: Never Used  Vaping Use  . Vaping Use: Never used  Substance and Sexual Activity  . Alcohol use: Not Currently    Alcohol/week: 0.0 standard drinks    Comment: 1-2 per year   . Drug use: Never  . Sexual activity: Not Currently  Other Topics Concern  . Not on file  Social History Narrative   Married for 3 years.Lives with husband.Bachelors in Education officer, museum.   Right handed   Caffeine: 1-2 per day    Social Determinants of Health   Financial Resource Strain: Not on file  Food Insecurity: Not on file  Transportation Needs: Not on file  Physical Activity: Not on file  Stress: Not on file  Social Connections: Not on file  Intimate Partner Violence: Not on file    Family History  Problem Relation Age of Onset  . Hypertension Father   . Hyperlipidemia Father   . Diabetes Father   . Stroke Father   . Depression Father   . Anxiety disorder Father   . Hypertension Mother   . Hyperlipidemia Mother   . Diabetes Mellitus II Mother   . Congestive Heart Failure Mother   . Heart attack Mother   . Hypertension Sister   . Diabetes Sister   . Hypertension Brother   . Diabetes Maternal Grandmother   . Lupus Paternal  Grandmother   . Depression Paternal Grandmother   . Anxiety disorder Paternal Grandmother   . Depression Maternal Aunt   . Anxiety disorder Maternal Aunt   . Colon cancer Neg Hx   . Esophageal cancer Neg Hx   . Gastric cancer Neg Hx     Past Medical History:  Diagnosis Date  . Anemia   . Anxiety   . Arrhythmia   . Bipolar depression ([place]) 12/28/2019  . Chronic headaches   . Depression   . Diabetes mellitus without complication ([place])   . Fibromyalgia   . GERD (gastroesophageal reflux disease)   . High cholesterol    . HLD (hyperlipidemia)   . Hypertension   . Insect bite   . Migraine   . Prediabetes   . Sleep apnea   . Urinary incontinence     Patient Active Problem List   Diagnosis Date Noted  . Unsteady gait when walking 12/13/2020  . Major depressive disorder, single episode, severe ([place]) 07/14/2020  . Major depression 07/12/2020  . Dysphagia 05/11/2020  . GERD (gastroesophageal reflux disease) 05/11/2020  . Type 2 diabetes mellitus ([place]) 03/27/2020  . Bipolar depression ([place]) 12/28/2019  . Sleep apnea 12/28/2019  . Acute lower UTI 10/31/2019  . Hypokalemia 10/31/2019  . Tachycardia 10/31/2019  . Urinary tract infection without hematuria   . Hyperlipidemia 10/30/2015  . Obesity due to excess calories 10/30/2015    Past Surgical History:  Procedure Laterality Date  . ESOPHAGOGASTRODUODENOSCOPY N/A 05/12/2020   Procedure: ESOPHAGOGASTRODUODENOSCOPY (EGD);  Surgeon: Daneil Dolin, MD;  [facility];  Service: Endoscopy;  Laterality: N/A;  11:15am  . Joint fusion on foot Right   . MALONEY DILATION N/A 05/12/2020   Procedure: Venia Minks DILATION;  Surgeon: Daneil Dolin, MD;  [facility];  Service: Endoscopy;  Laterality: N/A;  . NASAL SEPTUM SURGERY      Current Outpatient Medications  Medication Sig Dispense Refill  . ALPRAZolam (XANAX) 0.5 MG tablet Take 0.5 mg by mouth daily as needed.    Marland Kitchen atorvastatin (LIPITOR) 10 MG tablet TAKE 1 TABLET BY MOUTH EVERY DAY 90 tablet 0  . buPROPion (WELLBUTRIN SR) 100 MG 12 hr tablet Take 100 mg by mouth at bedtime.    . chlorthalidone (HYGROTON) 25 MG tablet Take 25 mg by mouth daily.    . Cholecalciferol (VITAMIN D-3) 125 MCG (5000 UT) TABS Take 2 tablets by mouth daily. 60 tablet 3  . escitalopram (LEXAPRO) 20 MG tablet Take 20 mg by mouth daily.    Marland Kitchen estradiol (ESTRACE) 0.5 MG tablet TAKE 1 TABLET (0.5 MG TOTAL) BY MOUTH DAILY. 90 tablet 0  . fluticasone (FLONASE) 50 MCG/ACT nasal spray Place 2 sprays into both  nostrils daily. 16 g 6  . glipiZIDE (GLUCOTROL) 10 MG tablet TAKE 1 TABLET BY MOUTH TWICE A DAY BEFORE A MEAL 180 tablet 0  . losartan (COZAAR) 25 MG tablet TAKE 1 TABLET BY MOUTH EVERY DAY 90 tablet 0  . metoprolol tartrate (LOPRESSOR) 50 MG tablet Take 1 tablet (50 mg total) by mouth 2 (two) times daily. 180 tablet 0  . pantoprazole (PROTONIX) 40 MG tablet TAKE 1 TABLET BY MOUTH TWICE DAILY 180 tablet 1  . potassium chloride SA (KLOR-CON) 20 MEQ tablet Take by mouth. Take 2 tablets in the morning and 1 tablet in the evening.    . progesterone (PROMETRIUM) 200 MG capsule Take 1 capsule (200 mg total) by mouth daily. 30 capsule 3   No current  facility-administered medications for this visit.    Allergies as of 12/13/2020 - Review Complete 12/13/2020  Allergen Reaction Noted  . Keflex [cephalexin] Itching and Swelling 05/02/2020    Vitals: BP 113/76 (BP Location: Right Arm, Patient Position: Sitting, Cuff Size: Large)   Pulse 71   Ht 5\' 10"  (1.778 m)   Wt 252 lb (114.3 kg)   LMP  (LMP Unknown)   BMI 36.16 kg/m  Last Weight:  Wt Readings from Last 1 Encounters:  12/13/20 252 lb (114.3 kg)   Last Height:   Ht Readings from Last 1 Encounters:  12/13/20 5\' 10"  (1.778 m)     Physical exam: Exam: Gen: NAD, conversant, well nourised, obese, well groomed                     CV: RRR, no MRG. No Carotid Bruits. No peripheral edema, warm, nontender Eyes: Conjunctivae clear without exudates or hemorrhage  Neuro: Detailed Neurologic Exam  Speech:    Speech is normal; fluent and spontaneous with normal comprehension.  Cognition:    The patient is oriented to person, place, and time;     recent and remote memory intact;     language fluent;     normal attention, concentration,     fund of knowledge Cranial Nerves:    The pupils are equal, round, and reactive to light. The fundi areflat. Visual fields are full to finger confrontation. Extraocular movements are intact. Trigeminal  sensation is intact and the muscles of mastication are normal. The face is symmetric. The palate elevates in the midline. Hearing intact. Voice is normal. Shoulder shrug is normal. The tongue has normal motion without fasciculations.   Coordination:    Normal finger to nose. No dysmetria or ataxia  Gait:    Heel and toe walking are normal. Difficulty with tandem but can complete. Decreased right arm swing.   Motor Observation:    No asymmetry, no atrophy, and no involuntary movements noted. Tone:    Normal muscle tone.    Posture:    Posture is normal. normal erect    Strength:    Strength is V/V in the upper and lower limbs.      Sensation: intact to LT     Reflex Exam:  DTR's:    Deep tendon reflexes in the upper and lower extremities are normal bilaterally.   Toes:    The toes are downgoing bilaterally.   Clonus:    Clonus is absent.    Assessment/Plan:  54 y.o. female here as requested by Ailene Ards, NP for dizziness. PMHx sleep panea, prediabetes, migraine, HTN, HLD, Fibromyalgia, depression, bipolar disease, anxiety, anemia. Patient says the main problem she has is unsteadiness. Dizziness is "rare". She denies weakness, sensory changes, falls, lightheadedness, tremor, or any other associated symptom. Exam showed some imbalance with heel-to-toe and mild decreased arm swing on the right but normal stance and stride, tone normal, strength intact, no abnormal movements, gets up without use of hands. She does have brisk reflexes so will check an MRI of her cervical spine. She does not practice any balance exercises at home so will send to PT for balance.   - Physical Therapy: [facility] in [place] - MRI Cervical Spine in [place] - I also discussed the mild white matter changes in the brain and discussed keeping vascular risk factors tightly controlled - I discussed with patient if she feels improved with PT she can follow up as needed. But if she  has new symptoms or  has worsening of unsteadiness to email me and we will have her back to the office to monitor clinically or perform more testing. She agreed.     Orders Placed This Encounter  Procedures  . MR CERVICAL SPINE WO CONTRAST  . Ambulatory referral to Physical Therapy    Cc: Ailene Ards, NP,  Doree Albee, MD  Sarina Ill, MD  [facility] [address] [place], [place] [address]  Phone [phone] Fax [phone]

## 2020-12-13 NOTE — Patient Instructions (Signed)
Physical Therapy: [facility] in [place] [facility] in [place] in the Home, Adult Falls can cause injuries. They can happen to people of all ages. There are many things you can do to make your home safe and to help prevent falls. Ask for help when making these changes, if needed. What actions can I take to prevent falls? General Instructions  Use good lighting in all rooms. Replace any light bulbs that burn out.  Turn on the lights when you go into a dark area. Use night-lights.  Keep items that you use often in easy-to-reach places. Lower the shelves around your home if necessary.  Set up your furniture so you have a clear path. Avoid moving your furniture around.  Do not have throw rugs and other things on the floor that can make you trip.  Avoid walking on wet floors.  If any of your floors are uneven, fix them.  Add color or contrast paint or tape to clearly mark and help you see: ? Any grab bars or handrails. ? First and last steps of stairways. ? Where the edge of each step is.  If you use a stepladder: ? Make sure that it is fully opened. Do not climb a closed stepladder. ? Make sure that both sides of the stepladder are locked into place. ? Ask someone to hold the stepladder for you while you use it.  If there are any pets around you, be aware of where they are. What can I do in the bathroom?      Keep the floor dry. Clean up any water that spills onto the floor as soon as it happens.  Remove soap buildup in the tub or shower regularly.  Use non-skid mats or decals on the floor of the tub or shower.  Attach bath mats securely with double-sided, non-slip rug tape.  If you need to sit down in the shower, use a plastic, non-slip stool.  Install grab bars by the toilet and in the tub and shower. Do not use towel bars as grab bars. What can I do in the bedroom?  Make sure that you have a light by your bed that is easy to  reach.  Do not use any sheets or blankets that are too big for your bed. They should not hang down onto the floor.  Have a firm chair that has side arms. You can use this for support while you get dressed. What can I do in the kitchen?  Clean up any spills right away.  If you need to reach something above you, use a strong step stool that has a grab bar.  Keep electrical cords out of the way.  Do not use floor polish or wax that makes floors slippery. If you must use wax, use non-skid floor wax. What can I do with my stairs?  Do not leave any items on the stairs.  Make sure that you have a light switch at the top of the stairs and the bottom of the stairs. If you do not have them, ask someone to add them for you.  Make sure that there are handrails on both sides of the stairs, and use them. Fix handrails that are broken or loose. Make sure that handrails are as long as the stairways.  Install non-slip stair treads on all stairs in your home.  Avoid having throw rugs at the top or bottom of the stairs. If you do have throw rugs, attach  them to the floor with carpet tape.  Choose a carpet that does not hide the edge of the steps on the stairway.  Check any carpeting to make sure that it is firmly attached to the stairs. Fix any carpet that is loose or worn. What can I do on the outside of my home?  Use bright outdoor lighting.  Regularly fix the edges of walkways and driveways and fix any cracks.  Remove anything that might make you trip as you walk through a door, such as a raised step or threshold.  Trim any bushes or trees on the path to your home.  Regularly check to see if handrails are loose or broken. Make sure that both sides of any steps have handrails.  Install guardrails along the edges of any raised decks and porches.  Clear walking paths of anything that might make someone trip, such as tools or rocks.  Have any leaves, snow, or ice cleared regularly.  Use  sand or salt on walking paths during winter.  Clean up any spills in your garage right away. This includes grease or oil spills. What other actions can I take?  Wear shoes that: ? Have a low heel. Do not wear high heels. ? Have rubber bottoms. ? Are comfortable and fit you well. ? Are closed at the toe. Do not wear open-toe sandals.  Use tools that help you move around (mobility aids) if they are needed. These include: ? Canes. ? Walkers. ? Scooters. ? Crutches.  Review your medicines with your doctor. Some medicines can make you feel dizzy. This can increase your chance of falling. Ask your doctor what other things you can do to help prevent falls. Where to find more information  Centers for Disease Control and Prevention, STEADI: https://garcia.biz/  [employer] on Aging: BrainJudge.co.uk Contact a doctor if:  You are afraid of falling at home.  You feel weak, drowsy, or dizzy at home.  You fall at home. Summary  There are many simple things that you can do to make your home safe and to help prevent falls.  Ways to make your home safe include removing tripping hazards and installing grab bars in the bathroom.  Ask for help when making these changes in your home. This information is not intended to replace advice given to you by your health care provider. Make sure you discuss any questions you have with your health care provider. Document Revised: 04/08/2019 Document Reviewed: 07/31/2017 Elsevier Patient Education  2020 [employer].

## 2020-12-14 ENCOUNTER — Telehealth: Payer: Self-pay | Admitting: Neurology

## 2020-12-14 NOTE — Telephone Encounter (Signed)
LVM for pt to call back to see if she would be okay w/[facility] because [facility] or any of the [place] health places do not take her insurance.

## 2020-12-14 NOTE — Telephone Encounter (Signed)
Patient returned my call she is okay with going to [facility]. She is scheduled for Tuesday 12/26/20 arrival time is 2:30 pm. Patient is aware of time and day. I also gave her their number of [phone] incase she needed to r/s.

## 2020-12-18 ENCOUNTER — Other Ambulatory Visit: Payer: Self-pay

## 2020-12-18 ENCOUNTER — Encounter (INDEPENDENT_AMBULATORY_CARE_PROVIDER_SITE_OTHER): Payer: Self-pay | Admitting: Nurse Practitioner

## 2020-12-18 ENCOUNTER — Ambulatory Visit (INDEPENDENT_AMBULATORY_CARE_PROVIDER_SITE_OTHER): Payer: Medicare Other | Admitting: Nurse Practitioner

## 2020-12-18 VITALS — BP 116/72 | HR 74 | Temp 97.7°F | Ht 70.0 in | Wt 251.6 lb

## 2020-12-18 DIAGNOSIS — R5381 Other malaise: Secondary | ICD-10-CM

## 2020-12-18 DIAGNOSIS — Z23 Encounter for immunization: Secondary | ICD-10-CM | POA: Diagnosis not present

## 2020-12-18 DIAGNOSIS — R5383 Other fatigue: Secondary | ICD-10-CM | POA: Diagnosis not present

## 2020-12-18 DIAGNOSIS — R519 Headache, unspecified: Secondary | ICD-10-CM

## 2020-12-18 DIAGNOSIS — F332 Major depressive disorder, recurrent severe without psychotic features: Secondary | ICD-10-CM

## 2020-12-18 DIAGNOSIS — R37 Sexual dysfunction, unspecified: Secondary | ICD-10-CM

## 2020-12-18 DIAGNOSIS — E1165 Type 2 diabetes mellitus with hyperglycemia: Secondary | ICD-10-CM

## 2020-12-18 NOTE — Progress Notes (Signed)
Subjective:  Patient ID: Sydney Taylor, female    DOB: [birth year]  Age: 54 y.o. MRN: [id number]  CC:  Chief Complaint  Patient presents with  . Follow-up    Malaise, fatigue, constant headache for the past few months, has had blurry vision, wants Hemoglobin A1c done, having a flare up of back pain, having a problem with sexual disfunction      HPI  This patient arrives today for the above.  Malaise/fatigue: She tells me today that she continues to experience malaise, fatigue, headache, and sometimes feels sweaty.  She also has been told me she is been having some blurry vision and she did have evaluation with ophthalmology and new prescription for glasses was ordered and she has picked this up.  I believe she had similar symptoms back in August of this year and MRI of the brain was done at that time which was negative for any acute abnormality or mass.  She does have a history of depression and fibromyalgia.  She does continue on Wellbutrin, Lexapro, and as needed Xanax.  She recently was started on Lyrica to see if this would help her fibromyalgia.  She also mentions she has been experiencing some sexual dysfunction specifically reduced sensation of her clitoris.  She tells me that her sexual drive is also low.  She is on bioidentical hormone replacement therapy including estrogen and progesterone, and tells me she is tolerating these well.  She denies any new or worsening breast tenderness or vaginal bleeding.  She was on desiccated thyroid in the past for the off label treatment of symptoms of thyroid deficiency.  She tells me she ended up stopping this medicine to see if it was contributing to her generalized malaise and fatigue.  Since stopping the medicine she has not felt any better.  She does note that she has been experiencing thinning hair in addition to the fatigue, weight gain, malaise, and headache.  She denies cold intolerance or constipation.  Blood work in the past has ruled  out anemia, advanced kidney disease, hypothyroidism, and has had negative ANA.  Type 2 diabetes: Last A1c was in July of this year and was 6.4.  She continues on glipizide 10 mg twice daily.  She was on injectable GLP-1 in the past but for some reason is not taking this anymore.  She cannot take Metformin because she cannot tolerate the side effects.  She tells me that she checks her blood sugar intermittently at home and last time she checked her fasting blood sugar it was around 123.  She is due for foot exam and have urine checked for microalbuminuria.  We did discuss starting her on statin therapy in the past and she was agreeable to this but tells me she never did start it because she just has not been feeling well and did not want to add another medication that could possibly cause her to feel worse.  Depression: She does have a history of depression and does follow with psychiatry on a regular basis.  She is currently on Wellbutrin, Lexapro, and as needed Xanax.  She is tolerating these medications well.  She denies any suicidal ideation today, but does endorse sometimes feeling like she would be better off if she was dead.  She denies any plan  Past Medical History:  Diagnosis Date  . Anemia   . Anxiety   . Arrhythmia   . Bipolar depression ([place]) 12/28/2019  . Chronic headaches   . Depression   .  Diabetes mellitus without complication ([place])   . Fibromyalgia   . GERD (gastroesophageal reflux disease)   . High cholesterol   . HLD (hyperlipidemia)   . Hypertension   . Insect bite   . Migraine   . Prediabetes   . Sleep apnea   . Urinary incontinence       Family History  Problem Relation Age of Onset  . Hypertension Father   . Hyperlipidemia Father   . Diabetes Father   . Stroke Father   . Depression Father   . Anxiety disorder Father   . Hypertension Mother   . Hyperlipidemia Mother   . Diabetes Mellitus II Mother   . Congestive Heart Failure Mother   . Heart attack  Mother   . Hypertension Sister   . Diabetes Sister   . Hypertension Brother   . Diabetes Maternal Grandmother   . Lupus Paternal Grandmother   . Depression Paternal Grandmother   . Anxiety disorder Paternal Grandmother   . Depression Maternal Aunt   . Anxiety disorder Maternal Aunt   . Colon cancer Neg Hx   . Esophageal cancer Neg Hx   . Gastric cancer Neg Hx     Social History   Social History Narrative   Married for 3 years.Lives with husband.Bachelors in Education officer, [employer].   Right handed   Caffeine: 1-2 per day    Social History   Tobacco Use  . Smoking status: Former Smoker    Types: Cigarettes    Quit date: 12/30/1982    Years since quitting: 37.9  . Smokeless tobacco: Never Used  Substance Use Topics  . Alcohol use: Not Currently    Alcohol/week: 0.0 standard drinks    Comment: 1-2 per year      Current Meds  Medication Sig  . ALPRAZolam (XANAX) 0.5 MG tablet Take 0.5 mg by mouth daily as needed.  Marland Kitchen buPROPion (WELLBUTRIN SR) 100 MG 12 hr tablet Take 100 mg by mouth at bedtime.  . chlorthalidone (HYGROTON) 25 MG tablet Take 25 mg by mouth daily.  . Cholecalciferol (VITAMIN D-3) 125 MCG (5000 UT) TABS Take 2 tablets by mouth daily.  Marland Kitchen escitalopram (LEXAPRO) 20 MG tablet Take 20 mg by mouth daily.  Marland Kitchen estradiol (ESTRACE) 0.5 MG tablet TAKE 1 TABLET (0.5 MG TOTAL) BY MOUTH DAILY.  . fluticasone (FLONASE) 50 MCG/ACT nasal spray Place 2 sprays into both nostrils daily.  Marland Kitchen glipiZIDE (GLUCOTROL) 10 MG tablet TAKE 1 TABLET BY MOUTH TWICE A DAY BEFORE A MEAL  . losartan (COZAAR) 25 MG tablet TAKE 1 TABLET BY MOUTH EVERY DAY  . metoprolol tartrate (LOPRESSOR) 50 MG tablet Take 1 tablet (50 mg total) by mouth 2 (two) times daily.  . pantoprazole (PROTONIX) 40 MG tablet TAKE 1 TABLET BY MOUTH TWICE DAILY  . potassium chloride SA (KLOR-CON) 20 MEQ tablet Take by mouth. Take 2 tablets in the morning and 1 tablet in the evening.  . pregabalin (LYRICA) 50 MG capsule Take 50 mg by mouth at  bedtime.  . progesterone (PROMETRIUM) 200 MG capsule Take 1 capsule (200 mg total) by mouth daily.    ROS:  Review of Systems  Constitutional: Positive for diaphoresis and malaise/fatigue. Negative for chills, fever and weight loss.  Eyes: Positive for blurred vision.  Respiratory: Negative for cough and shortness of breath.   Cardiovascular: Positive for leg swelling. Negative for chest pain, orthopnea and PND.  Genitourinary:       (-) polyuria  Neurological: Positive for headaches.  Endo/Heme/Allergies:  Negative for polydipsia.       (-) polyphagia  Psychiatric/Behavioral: Positive for depression. Negative for suicidal ideas.     Objective:   Today's Vitals: BP 116/72   Pulse 74   Temp 97.7 F (36.5 C) (Temporal)   Ht 5\' 10"  (1.778 m)   Wt 251 lb 9.6 oz (114.1 kg)   LMP  (LMP Unknown)   SpO2 98%   BMI 36.10 kg/m  Vitals with BMI 12/18/2020 12/13/2020 10/16/2020  Height 5\' 10"  5\' 10"  5\' 10"   Weight 251 lbs 10 oz 252 lbs 241 lbs  BMI 36.1 82.99 37.16  Systolic 967 893 810  Diastolic 72 76 74  Pulse 74 71 64  Some encounter information is confidential and restricted. Go to Review Flowsheets activity to see all data.     PHQ9 SCORE ONLY 12/18/2020 07/20/2020 04/06/2020  PHQ-9 Total Score 20 21 0     Physical Exam Vitals reviewed.  Constitutional:      General: She is not in acute distress.    Appearance: Normal appearance.  HENT:     Head: Normocephalic and atraumatic.  Neck:     Vascular: No carotid bruit.  Cardiovascular:     Rate and Rhythm: Normal rate and regular rhythm.     Pulses: Normal pulses.          Dorsalis pedis pulses are 2+ on the right side and 2+ on the left side.     Heart sounds: Normal heart sounds.  Pulmonary:     Effort: Pulmonary effort is normal.     Breath sounds: Normal breath sounds.  Musculoskeletal:     Right foot: No deformity.     Left foot: No deformity.  Feet:     Right foot:     Protective Sensation: 10 sites tested.  10 sites sensed.     Skin integrity: Skin integrity normal.     Toenail Condition: Right toenails are normal.     Left foot:     Protective Sensation: 10 sites tested. 10 sites sensed.     Skin integrity: Skin integrity normal.     Toenail Condition: Left toenails are normal.  Skin:    General: Skin is warm and dry.  Neurological:     General: No focal deficit present.     Mental Status: She is alert and oriented to person, place, and time.  Psychiatric:        Mood and Affect: Mood normal.        Behavior: Behavior normal.        Judgment: Judgment normal.          Assessment and Plan   1. Fatigue, unspecified type   2. Intractable headache, unspecified chronicity pattern, unspecified headache type   3. Malaise   4. Type 2 diabetes mellitus with hyperglycemia, without long-term current use of insulin (HCC)   5. Sexual dysfunction   6. Need for pneumococcal vaccination   7. Severe episode of recurrent major depressive disorder, without psychotic features ([place])      Plan: 1.-3.  Unsure of current etiology.  Most likely I think her symptoms are related to a combination of her fibromyalgia, depression, diabetes, and the fact that she is on multiple medications.  I will collect additional blood work today for further evaluation.  We will also refer patient to rheumatology in case there might be a underlying autoimmune disease that has not yet been diagnosed.  Further recommendations were made based upon those results. 4., 6.  We will check A1c and urine for albuminuria today.  Foot exam completed today.  We will have PPSV23 administered today. 5.  We will check testosterone levels in addition to her other blood work as ordered below.  She will continue on her estradiol and progesterone 7.  She will follow-up with psychiatry as scheduled.  She does not appear to be actively suicidal at this time.   Tests ordered Orders Placed This Encounter  Procedures  . Pneumococcal  polysaccharide vaccine 23-valent greater than or equal to 2yo subcutaneous/IM  . Hemoglobin A1c  . C-reactive Protein  . Sedimentation Rate  . Microalbumin/Creatinine Ratio, Urine  . TSH  . T3, Free  . T4, Free  . Testosterone, Free, Total, SHBG  . Testos,Total,Free and SHBG (Female)  . Ambulatory referral to Rheumatology      No orders of the defined types were placed in this encounter.   Patient to follow-up in 1 month to discuss bioidentical hormone replacement therapy and for further discussion regarding treatment for her fatigue.  Ailene Ards, NP

## 2020-12-19 ENCOUNTER — Telehealth: Payer: Self-pay | Admitting: Neurology

## 2020-12-19 NOTE — Telephone Encounter (Signed)
Sent to [facility] Tele[facility] [phone] - Fax [phone]  Will Put Order back For Dr. Jaynee Eagles to Sign Thanks Hinton Dyer .

## 2020-12-21 ENCOUNTER — Other Ambulatory Visit (INDEPENDENT_AMBULATORY_CARE_PROVIDER_SITE_OTHER): Payer: Self-pay | Admitting: Internal Medicine

## 2020-12-22 LAB — MICROALBUMIN / CREATININE URINE RATIO
Creatinine, Urine: 202 mg/dL (ref 20–275)
Microalb Creat Ratio: 5 mcg/mg creat (ref <30)
Microalb, Ur: 1.1 mg/dL

## 2020-12-22 LAB — TSH: TSH: 1.99 mIU/L

## 2020-12-22 LAB — TESTOS,TOTAL,FREE AND SHBG (FEMALE)
Free Testosterone: 4 pg/mL (ref 0.1–6.4)
Sex Hormone Binding: 20 nmol/L (ref 17–124)
Testosterone, Total, LC-MS-MS: 20 ng/dL (ref 2–45)

## 2020-12-22 LAB — HEMOGLOBIN A1C
Hgb A1c MFr Bld: 6.9 % of total Hgb — ABNORMAL HIGH (ref <5.7)
Mean Plasma Glucose: 151 mg/dL
eAG (mmol/L): 8.4 mmol/L

## 2020-12-22 LAB — T3, FREE: T3, Free: 2.6 pg/mL (ref 2.3–4.2)

## 2020-12-22 LAB — SEDIMENTATION RATE: Sed Rate: 14 mm/h (ref 0–30)

## 2020-12-22 LAB — C-REACTIVE PROTEIN: CRP: 11.7 mg/L — ABNORMAL HIGH (ref <8.0)

## 2020-12-22 LAB — T4, FREE: Free T4: 1 ng/dL (ref 0.8–1.8)

## 2020-12-26 ENCOUNTER — Ambulatory Visit (HOSPITAL_COMMUNITY): Payer: Medicare Other

## 2020-12-26 ENCOUNTER — Telehealth (INDEPENDENT_AMBULATORY_CARE_PROVIDER_SITE_OTHER): Payer: Self-pay

## 2020-12-26 NOTE — Telephone Encounter (Signed)
Patient called and left a voice message and stated that she has had a cold for about 3 weeks and took 3 Covid tests in the beginning and they were negative.  I called the patient and she stated that she has a stuffy head, body aches, and very tired and has not had a flu test. I advised for the patient to go to Urgent Care since we do not have a provider this week. Patient verbalized an understanding and will call next week to let us know how she is doing.

## 2020-12-27 ENCOUNTER — Other Ambulatory Visit: Payer: Self-pay

## 2020-12-27 ENCOUNTER — Ambulatory Visit
Admission: RE | Admit: 2020-12-27 | Discharge: 2020-12-27 | Disposition: A | Discharge status: Home / Self Care | Payer: Medicare Other | Source: Ambulatory Visit | Attending: Family Medicine | Admitting: Family Medicine

## 2020-12-27 VITALS — BP 117/80 | HR 67 | Temp 98.8°F | Resp 17 | Ht 70.0 in | Wt 250.0 lb

## 2020-12-27 DIAGNOSIS — R0981 Nasal congestion: Secondary | ICD-10-CM

## 2020-12-27 DIAGNOSIS — R059 Cough, unspecified: Secondary | ICD-10-CM | POA: Diagnosis not present

## 2020-12-27 DIAGNOSIS — R1084 Generalized abdominal pain: Secondary | ICD-10-CM

## 2020-12-27 DIAGNOSIS — R52 Pain, unspecified: Secondary | ICD-10-CM

## 2020-12-27 DIAGNOSIS — R5383 Other fatigue: Secondary | ICD-10-CM

## 2020-12-27 DIAGNOSIS — B349 Viral infection, unspecified: Secondary | ICD-10-CM

## 2020-12-27 DIAGNOSIS — J3489 Other specified disorders of nose and nasal sinuses: Secondary | ICD-10-CM

## 2020-12-27 MED ORDER — AZITHROMYCIN 250 MG PO TABS: 250.0000 mg | ORAL_TABLET | Freq: Every day | ORAL | 0 refills | Status: DC

## 2020-12-27 NOTE — ED Triage Notes (Addendum)
Headache, cough,  runny nose, congestion, upset stomach x 1week. Nurse at pcp office suggested she get a chest xray. Her pcp is off this week.

## 2020-12-27 NOTE — Discharge Instructions (Addendum)
I have sent in azithromycin for you to take. Take 2 tablets today, then one tablet daily for the next 4 days.  Get this filled on Friday if your symptoms are not improving  Your COVID and Flu tests are pending.  You should self quarantine until the test results are back.    Take Tylenol or ibuprofen as needed for fever or discomfort.  Rest and keep yourself hydrated.    Follow-up with your primary care provider if your symptoms are not improving.

## 2020-12-27 NOTE — ED Provider Notes (Signed)
[facility]   [id number] 12/27/20 Arrival Time: 1352   CC: COVID symptoms  SUBJECTIVE: History from: patient.  Sydney Taylor is a 54 y.o. female who presents with abrupt onset of headache, cough, rhinorrhea, nasal congestion, intermittent diarrhea, x 1 week. Denies sick exposure to COVID, flu or strep. Denies recent travel. Has negative history of Covid. Has completed Covid vaccines. Has had Covid booster. Has not had flu vaccine this year. Has taken OTC cough and cold with little relief. There are no aggravating or alleviating factors. Denies previous symptoms in the past. Denies fever, chills, sinus pain, sore throat, SOB, wheezing, chest pain, nausea, changes in bladder habits.    ROS: As per HPI.  All other pertinent ROS negative.     Past Medical History:  Diagnosis Date  . Anemia   . Anxiety   . Arrhythmia   . Bipolar depression (HCC) 12/28/2019  . Chronic headaches   . Depression   . Diabetes mellitus without complication (HCC)   . Fibromyalgia   . GERD (gastroesophageal reflux disease)   . High cholesterol   . HLD (hyperlipidemia)   . Hypertension   . Insect bite   . Migraine   . Prediabetes   . Sleep apnea   . Urinary incontinence    Past Surgical History:  Procedure Laterality Date  . ESOPHAGOGASTRODUODENOSCOPY N/A 05/12/2020   Procedure: ESOPHAGOGASTRODUODENOSCOPY (EGD);  Surgeon: Corbin Ade, MD;  [facility];  Service: Endoscopy;  Laterality: N/A;  11:15am  . Joint fusion on foot Right   . MALONEY DILATION N/A 05/12/2020   Procedure: Elease Hashimoto DILATION;  Surgeon: Corbin Ade, MD;  Location: [facility];  Service: Endoscopy;  Laterality: N/A;  . NASAL SEPTUM SURGERY     Allergies  Allergen Reactions  . Cephalexin Itching, Swelling and Other (See Comments)   No current facility-administered medications on file prior to encounter.   Current Outpatient Medications on File Prior to Encounter  Medication Sig Dispense Refill  .  ALPRAZolam (XANAX) 0.5 MG tablet Take 0.5 mg by mouth daily as needed.    Marland Kitchen buPROPion (WELLBUTRIN SR) 100 MG 12 hr tablet Take 100 mg by mouth at bedtime.    . chlorthalidone (HYGROTON) 25 MG tablet Take 25 mg by mouth daily.    . Cholecalciferol (VITAMIN D3) 125 MCG (5000 UT) TABS TAKE 2 TABLETS BY MOUTH EVERY DAY 180 tablet 1  . escitalopram (LEXAPRO) 20 MG tablet Take 20 mg by mouth daily.    Marland Kitchen estradiol (ESTRACE) 0.5 MG tablet TAKE 1 TABLET (0.5 MG TOTAL) BY MOUTH DAILY. 90 tablet 0  . fluticasone (FLONASE) 50 MCG/ACT nasal spray Place 2 sprays into both nostrils daily. 16 g 6  . glipiZIDE (GLUCOTROL) 10 MG tablet TAKE 1 TABLET BY MOUTH TWICE A DAY BEFORE A MEAL 180 tablet 0  . losartan (COZAAR) 25 MG tablet TAKE 1 TABLET BY MOUTH EVERY DAY 90 tablet 0  . metoprolol tartrate (LOPRESSOR) 50 MG tablet Take 1 tablet (50 mg total) by mouth 2 (two) times daily. 180 tablet 0  . pantoprazole (PROTONIX) 40 MG tablet TAKE 1 TABLET BY MOUTH TWICE DAILY 180 tablet 1  . potassium chloride SA (KLOR-CON) 20 MEQ tablet Take by mouth. Take 2 tablets in the morning and 1 tablet in the evening.    . pregabalin (LYRICA) 50 MG capsule Take 50 mg by mouth at bedtime.    . progesterone (PROMETRIUM) 200 MG capsule Take 1 capsule (200 mg total) by mouth  daily. 30 capsule 3   Social History   Socioeconomic History  . Marital status: Married    Spouse name: Not on file  . Number of children: 0  . Years of education: Not on file  . Highest education level: Bachelor's degree (e.g., BA, AB, BS)  Occupational History  . Occupation: research associate/drug development  Tobacco Use  . Smoking status: Former Smoker    Types: Cigarettes    Quit date: 12/30/1982    Years since quitting: 38.0  . Smokeless tobacco: Never Used  Vaping Use  . Vaping Use: Never used  Substance and Sexual Activity  . Alcohol use: Not Currently    Alcohol/week: 0.0 standard drinks    Comment: 1-2 per year   . Drug use: Never  . Sexual  activity: Not Currently  Other Topics Concern  . Not on file  Social History Narrative   Married for 3 years.Lives with husband.Bachelors in Education officer, museum.   Right handed   Caffeine: 1-2 per day    Social Determinants of Health   Financial Resource Strain: Not on file  Food Insecurity: Not on file  Transportation Needs: Not on file  Physical Activity: Not on file  Stress: Not on file  Social Connections: Not on file  Intimate Partner Violence: Not on file   Family History  Problem Relation Age of Onset  . Hypertension Father   . Hyperlipidemia Father   . Diabetes Father   . Stroke Father   . Depression Father   . Anxiety disorder Father   . Hypertension Mother   . Hyperlipidemia Mother   . Diabetes Mellitus II Mother   . Congestive Heart Failure Mother   . Heart attack Mother   . Hypertension Sister   . Diabetes Sister   . Hypertension Brother   . Diabetes Maternal Grandmother   . Lupus Paternal Grandmother   . Depression Paternal Grandmother   . Anxiety disorder Paternal Grandmother   . Depression Maternal Aunt   . Anxiety disorder Maternal Aunt   . Colon cancer Neg Hx   . Esophageal cancer Neg Hx   . Gastric cancer Neg Hx     OBJECTIVE:  Vitals:   12/27/20 1435 12/27/20 1437  BP:  117/80  Pulse:  67  Resp:  17  Temp:  98.8 F (37.1 C)  TempSrc:  Oral  SpO2:  95%  Weight: 250 lb (113.4 kg)   Height: 5\' 10"  (1.778 m)      General appearance: alert; appears fatigued, but nontoxic; speaking in full sentences and tolerating own secretions HEENT: NCAT; Ears: EACs clear, TMs pearly gray; Eyes: PERRL.  EOM grossly intact. Sinuses: nontender; Nose: nares patent without rhinorrhea, Throat: oropharynx mildly erythematous, cobblestoning present, tonsils non erythematous or enlarged, uvula midline  Neck: supple with LAD Lungs: unlabored respirations, symmetrical air entry; cough: absent; no respiratory distress; CTAB Heart: regular rate and rhythm.  Radial pulses 2+  symmetrical bilaterally Skin: warm and dry Psychological: alert and cooperative; normal mood and affect  LABS:  No results found for this or any previous visit (from the past 24 hour(s)).   ASSESSMENT & PLAN:  1. Viral illness   2. Cough   3. Generalized abdominal pain   4. Body aches   5. Other fatigue   6. Nasal congestion   7. Sinus pain     Meds ordered this encounter  Medications  . azithromycin (ZITHROMAX) 250 MG tablet    Sig: Take 1 tablet (250 mg total) by mouth  daily. Take first 2 tablets together, then 1 every day until finished.    Dispense:  6 tablet    Refill:  0    Order Specific Question:   Supervising Provider    Answer:   Chase Picket [id number]    Prescribed azithromycin for URI COVID testing ordered. It will take between 1-2 days for test results. Someone will contact you regarding abnormal results.   Work note provided Patient should remain in quarantine until they have received Covid results.  If negative you may resume normal activities (go back to work/school) while practicing hand hygiene, social distance, and mask wearing.  If positive, patient should remain in quarantine for 10 days from symptom onset AND greater than 72 hours after symptoms resolution (absence of fever without the use of fever-reducing medication and improvement in respiratory symptoms), whichever is longer Get plenty of rest and push fluids Use OTC zyrtec for nasal congestion, runny nose, and/or sore throat Use OTC flonase for nasal congestion and runny nose Use medications daily for symptom relief Use OTC medications like ibuprofen or tylenol as needed fever or pain Call or go to the ED if you have any new or worsening symptoms such as fever, worsening cough, shortness of breath, chest tightness, chest pain, turning blue, changes in mental status.  Reviewed expectations re: course of current medical issues. Questions answered. Outlined signs and symptoms indicating need for  more acute intervention. Patient verbalized understanding. After Visit Summary given.         Faustino Congress, NP 12/30/20 1149

## 2020-12-28 LAB — COVID-19, FLU A+B NAA
Influenza A, NAA: NOT DETECTED
Influenza B, NAA: NOT DETECTED
SARS-CoV-2, NAA: NOT DETECTED

## 2020-12-30 ENCOUNTER — Other Ambulatory Visit (INDEPENDENT_AMBULATORY_CARE_PROVIDER_SITE_OTHER): Payer: Self-pay | Admitting: Internal Medicine

## 2020-12-30 ENCOUNTER — Encounter (INDEPENDENT_AMBULATORY_CARE_PROVIDER_SITE_OTHER): Payer: Self-pay | Admitting: Internal Medicine

## 2020-12-30 DIAGNOSIS — E1100 Type 2 diabetes mellitus with hyperosmolarity without nonketotic hyperglycemic-hyperosmolar coma (NKHHC): Secondary | ICD-10-CM

## 2021-01-02 ENCOUNTER — Telehealth: Payer: Self-pay

## 2021-01-02 ENCOUNTER — Other Ambulatory Visit: Payer: Self-pay

## 2021-01-02 ENCOUNTER — Ambulatory Visit: Payer: No Typology Code available for payment source | Admitting: Nurse Practitioner

## 2021-01-02 ENCOUNTER — Encounter: Payer: Self-pay | Admitting: Nurse Practitioner

## 2021-01-02 VITALS — BP 120/70 | HR 70 | Temp 97.1°F | Ht 70.0 in | Wt 254.0 lb

## 2021-01-02 DIAGNOSIS — R1319 Other dysphagia: Secondary | ICD-10-CM | POA: Diagnosis not present

## 2021-01-02 DIAGNOSIS — K5730 Diverticulosis of large intestine without perforation or abscess without bleeding: Secondary | ICD-10-CM

## 2021-01-02 DIAGNOSIS — Z1159 Encounter for screening for other viral diseases: Secondary | ICD-10-CM

## 2021-01-02 DIAGNOSIS — R1084 Generalized abdominal pain: Secondary | ICD-10-CM

## 2021-01-02 DIAGNOSIS — R109 Unspecified abdominal pain: Secondary | ICD-10-CM | POA: Insufficient documentation

## 2021-01-02 DIAGNOSIS — K219 Gastro-esophageal reflux disease without esophagitis: Secondary | ICD-10-CM

## 2021-01-02 NOTE — Progress Notes (Signed)
Referring Provider: Doree Albee, MD Primary Care Physician:  Doree Albee, MD Primary GI:  Dr. Gala Romney  Chief Complaint  Patient presents with  . Dysphagia    No trouble currently  . Abdominal Pain    Discomfort all over x 1 week, burping. No nausea, no vomiting    HPI:   Sydney Taylor is a 55 y.o. female who presents for follow-up on GERD and dysphagia.  The patient was last seen in our office 05/11/2020 via video visit.  Patient was previously seen by low-power GI in 2016 with a chronic several year history of GERD well-controlled on AcipHex.  She was having burning upper chest pain at her last visit in [place] and recommended colonoscopy and EGD, continue AcipHex, continue abdominal imaging if no source determined on endoscopic evaluation.  However, no endoscopic notes could be found in the system.  Recent ER visit for dysphagia.  Had EGD scheduled for Tuesday at an unknown system, unsure if this is been completed.  At her last visit she noted persistent ongoing dysphagia symptoms about a month prior with pills and in 10 days prior started with foods.  Has been crushing pills but they are still sticking.  Cannot get foods down, currently on a liquid diet including broth and meal replacement shakes.  GERD symptoms not any worse than baseline with a flare every couple weeks.  She stopped AcipHex a "long time ago" was given Prilosec in ER but has not had it filled.  Previous colonoscopy in 2017 in [place], [place] and remote EGD 20 years prior but cannot remember results.  Recommended EGD with possible dilation, start Prilosec as prescribed by the emergency department (once a day), follow-up in 2 months.  EGD was completed 05/12/2020 which found erosive reflux esophagitis with a soft peptic stricture status post dilation.  Recommended Protonix 40 mg twice daily and follow-up in 6 weeks.  Patient called our office today after procedure indicating still having problems taking  pills, needing to crush all pills.  Asking if she could crush Protonix.  We informed her this could not be crushed but recommended Nexium granules.  Dr. Gala Romney weighed in, felt the symptoms more likely in the realm of globus hystericus as she has a widely patent upper GI tract with noted significant anxiety and depression component.  Recommended swallow eval via speech-language pathology.  She was sent in Nexium granules and referred to speech-language pathology.  She can call their office 05/15/2020 indicating she is having pill dysphagia again.  Nexium granules were all ordered by the pharmacy and should be in stock the day after.  She did have follow-up appointment scheduled in September 2021, which was canceled.  She again had a appointment in November 2021 but this was also canceled.  Today she states she doing okay overall. States her dysphagia is doing well currently. The pharmacy wasn't able to get the Nexium granules. She is taking Protonix bid without issue. GERD is 99% well managed. She has rarely used TUMS as needed, which helps with any flares. Began having abdominal pain about a week ago, primarily in the upper abdomen but often times diffuse. Described as "sore and achy". Occurs about every day, tends to last about half day to most of the day. No identified triggers, laying down/relaxing helps. Has been having a lot of flatulence and belching. Has a lot of depression and anxiety which has been a bit worse and thinks this has a lot to do with her current  symptoms. She has had some diarrhea a couple times a week as well; pain doesn't improve with stool. Denies N/V, hematochezia, melena, fever, chills, unintentional weight loss. She has felt like she's had a bad cold, a lot of fatigue; has been tested for COVID-19 (twice 2-3 days apart) and influenza, both of which were negative. The patient has received COVID-19 vaccination(s). She has had a flu vaccine. Denies chest pain, dyspnea, dizziness,  lightheadedness, syncope, near syncope. Denies any other upper or lower GI symptoms.   States last TCS was 4 years ago with Dr. Posey Pronto in [place], [place]. Diverticulosis, no polypts. Recommended 10 year repeat.  Past Medical History:  Diagnosis Date  . Anemia   . Anxiety   . Arrhythmia   . Bipolar depression ([place]) 12/28/2019  . Chronic fatigue   . Chronic headaches   . Depression   . Diabetes mellitus without complication ([place])   . Fibromyalgia   . GERD (gastroesophageal reflux disease)   . High cholesterol   . HLD (hyperlipidemia)   . Hypertension   . Insect bite   . Migraine   . Prediabetes   . Sleep apnea   . Urinary incontinence     Past Surgical History:  Procedure Laterality Date  . ESOPHAGOGASTRODUODENOSCOPY N/A 05/12/2020   Procedure: ESOPHAGOGASTRODUODENOSCOPY (EGD);  Surgeon: Daneil Dolin, MD;  [facility];  Service: Endoscopy;  Laterality: N/A;  11:15am  . Joint fusion on foot Right   . MALONEY DILATION N/A 05/12/2020   Procedure: Venia Minks DILATION;  Surgeon: Daneil Dolin, MD;  [facility];  Service: Endoscopy;  Laterality: N/A;  . NASAL SEPTUM SURGERY      Current Outpatient Medications  Medication Sig Dispense Refill  . ALPRAZolam (XANAX) 0.5 MG tablet Take 0.5 mg by mouth daily as needed.    Marland Kitchen buPROPion (WELLBUTRIN SR) 100 MG 12 hr tablet Take 100 mg by mouth at bedtime.    . chlorthalidone (HYGROTON) 25 MG tablet Take 25 mg by mouth daily.    . Cholecalciferol (VITAMIN D3) 125 MCG (5000 UT) TABS TAKE 2 TABLETS BY MOUTH EVERY DAY 180 tablet 1  . escitalopram (LEXAPRO) 20 MG tablet Take 20 mg by mouth daily.    Marland Kitchen estradiol (ESTRACE) 0.5 MG tablet TAKE 1 TABLET (0.5 MG TOTAL) BY MOUTH DAILY. 90 tablet 0  . fluticasone (FLONASE) 50 MCG/ACT nasal spray Place 2 sprays into both nostrils daily. 16 g 6  . glipiZIDE (GLUCOTROL) 10 MG tablet TAKE 1 TABLET BY MOUTH TWICE A DAY BEFORE A MEAL 180 tablet 0  . losartan (COZAAR) 25 MG tablet TAKE 1  TABLET BY MOUTH EVERY DAY 90 tablet 0  . metoprolol tartrate (LOPRESSOR) 50 MG tablet Take 1 tablet (50 mg total) by mouth 2 (two) times daily. 180 tablet 0  . pantoprazole (PROTONIX) 40 MG tablet TAKE 1 TABLET BY MOUTH TWICE DAILY 180 tablet 1  . potassium chloride SA (KLOR-CON) 20 MEQ tablet Take by mouth. Take 2 tablets in the morning and 1 tablet in the evening.    . pregabalin (LYRICA) 50 MG capsule Take 50 mg by mouth at bedtime.    . progesterone (PROMETRIUM) 200 MG capsule Take 1 capsule (200 mg total) by mouth daily. 30 capsule 3   No current facility-administered medications for this visit.    Allergies as of 01/02/2021 - Review Complete 01/02/2021  Allergen Reaction Noted  . Cephalexin Itching, Swelling, and Other (See Comments) 05/02/2020    Family History  Problem  Relation Age of Onset  . Hypertension Father   . Hyperlipidemia Father   . Diabetes Father   . Stroke Father   . Depression Father   . Anxiety disorder Father   . Hypertension Mother   . Hyperlipidemia Mother   . Diabetes Mellitus II Mother   . Congestive Heart Failure Mother   . Heart attack Mother   . Hypertension Sister   . Diabetes Sister   . Hypertension Brother   . Diabetes Maternal Grandmother   . Lupus Paternal Grandmother   . Depression Paternal Grandmother   . Anxiety disorder Paternal Grandmother   . Depression Maternal Aunt   . Anxiety disorder Maternal Aunt   . Colon cancer Neg Hx   . Esophageal cancer Neg Hx   . Gastric cancer Neg Hx     Social History   Socioeconomic History  . Marital status: Married    Spouse name: Not on file  . Number of children: 0  . Years of education: Not on file  . Highest education level: Bachelor's degree (e.g., BA, AB, BS)  Occupational History  . Occupation: research associate/drug development  Tobacco Use  . Smoking status: Former Smoker    Types: Cigarettes    Quit date: 12/30/1982    Years since quitting: 38.0  . Smokeless tobacco: Never Used   Vaping Use  . Vaping Use: Never used  Substance and Sexual Activity  . Alcohol use: Not Currently    Alcohol/week: 0.0 standard drinks    Comment: 1-2 per year   . Drug use: Never  . Sexual activity: Not Currently  Other Topics Concern  . Not on file  Social History Narrative   Married for 3 years.Lives with husband.Bachelors in Building surveyor.   Right handed   Caffeine: 1-2 per day    Social Determinants of Health   Financial Resource Strain: Not on file  Food Insecurity: Not on file  Transportation Needs: Not on file  Physical Activity: Not on file  Stress: Not on file  Social Connections: Not on file    Subjective: Review of Systems  Constitutional: Negative for chills, fever, malaise/fatigue and weight loss.  HENT: Negative for congestion and sore throat.   Respiratory: Negative for cough and shortness of breath.   Cardiovascular: Negative for chest pain and palpitations.  Gastrointestinal: Positive for abdominal pain and diarrhea. Negative for blood in stool, heartburn, melena, nausea and vomiting.       Belching, flatulence  Musculoskeletal: Negative for joint pain and myalgias.  Skin: Negative for rash.  Neurological: Negative for dizziness and weakness.  Endo/Heme/Allergies: Does not bruise/bleed easily.  Psychiatric/Behavioral: Negative for depression. The patient is not nervous/anxious.   All other systems reviewed and are negative.    Objective: BP 120/70   Pulse 70   Temp (!) 97.1 F (36.2 C)   Ht 5\' 10"  (1.778 m)   Wt 254 lb (115.2 kg)   LMP  (LMP Unknown)   BMI 36.45 kg/m  Physical Exam Vitals and nursing note reviewed.  Constitutional:      General: She is not in acute distress.    Appearance: Normal appearance. She is well-developed. She is obese. She is not ill-appearing, toxic-appearing or diaphoretic.  HENT:     Head: Normocephalic and atraumatic.     Nose: No congestion or rhinorrhea.  Eyes:     General: No scleral icterus. Cardiovascular:      Rate and Rhythm: Normal rate and regular rhythm.     Heart sounds:  Normal heart sounds.  Pulmonary:     Effort: Pulmonary effort is normal. No respiratory distress.     Breath sounds: Normal breath sounds.  Abdominal:     General: Bowel sounds are normal.     Palpations: Abdomen is soft. There is no hepatomegaly, splenomegaly or mass.     Tenderness: There is abdominal tenderness in the left upper quadrant and left lower quadrant. There is no guarding or rebound.     Hernia: No hernia is present.     Comments: Mild left-sided abdominal discomfort  Skin:    General: Skin is warm and dry.     Coloration: Skin is not jaundiced.     Findings: No rash.  Neurological:     General: No focal deficit present.     Mental Status: She is alert and oriented to person, place, and time.  Psychiatric:        Attention and Perception: Attention normal.        Mood and Affect: Mood normal.        Speech: Speech normal.        Behavior: Behavior normal.        Thought Content: Thought content normal.        Cognition and Memory: Cognition and memory normal.      Assessment:  Very pleasant 55 year old female presents to follow-up on dysphagia and GERD.  Currently her dysphagia symptoms are quiescent at the time.  GERD is doing well on PPI.  She has started having abdominal pain within the past couple weeks.  No red flag/warning signs or symptoms.  She is due for one-time screening for hepatitis C which she has agreed to.  GERD and dysphagia: As noted in HPI, her esophagus is generally widely patent.  Recurrent dysphagia symptoms previously felt likely due to globus hystericus.  Thankfully her dysphagia symptoms has since resolved.  Her GERD is doing well with rare breakthrough.  Recommend she continue her current medications including pantoprazole.  Notify us of any worsening symptoms  Abdominal pain: Pain is generally left-sided, although it is overall generalized.  She has had a colonoscopy  about 4 years ago in [place], [place] by Dr. Posey Pronto and states she was told she had no polyps but had diverticulosis.  We will request her previous colonoscopy records to be scanned into our system.  Waxing and waning, persistent abdominal pain that last for hours at a time over the past week is somewhat concerning for possible mild diverticulitis.  I will check a CT of the abdomen and pelvis to rule this out, preferably by the end of the week.  I have asked her to notify us of any worsening symptoms in the interim.  Due to increased gas and flatulence she can take simethicone or Beano.  We also discussed quite extensively that her GI discomfort/distress symptoms could be related to depression, anxiety, and stress.  She states she has had some increase in her knees recently.  Need for one-time screening for hepatitis C: She is agreeable to serological screening and I have put in an order for this.  She will hold onto it and have it completed when she sees her primary care in a month.  I have asked her to request they fax [place] the results   Plan: 1. Simethicone (Gas-X) or Beano to help with gas symptoms 2. Continue other current medications 3. Request previous colonoscopy from Dr. Posey Pronto in [place], [place] 4. Hepatitis C antibody 5. CT of the abdomen and  pelvis 6. Follow-up in 2 months 7. Call us for worsening symptoms    Thank you for allowing [place] to participate in the care of Montel Clock, DNP, AGNP-C Adult & Gerontological Nurse Practitioner [facility]   01/02/2021 4:41 PM   Disclaimer: This note was dictated with voice recognition software. Similar sounding words can inadvertently be transcribed and may not be corrected upon review.

## 2021-01-02 NOTE — Telephone Encounter (Signed)
ASAP CT abd/pelvis w/contrast scheduled for 01/03/21 arrive at 12:00pm prior to pt leaving OV. NPO 4 hours prior to test. If unable to pick up contrast today she would need to arrive 2 hours early for appt.  Gave pt appt letter for CT tomorrow. Stated she don't think she can for the appt tomorrow. Advised her to call [facility] to reschedule the appt and phone# is listed on appt letter. Advised her she will need to pick up contrast before day of test or arrive 2 hours prior to appt to drink contrast.

## 2021-01-02 NOTE — Patient Instructions (Signed)
Your health issues we discussed today were:   Dysphagia (swallowing difficulties): 1. I am glad you are doing better with this! 2. Let us know if you have any further swallowing difficulties  Abdominal pain with "GI upset" and increased gas: 1. For now you can try using simethicone (Gas-X) or Beano to see if this helps your gas 2. We will request your previous colonoscopy report from Dr. Allena Katz in [place], [place] 3. Because you remember your results [place] you have diverticula, I will check a CT of your abdomen and pelvis to evaluate for any problems, specifically flareup of diverticulitis that could be causing your abdominal pain 4. Further recommendations will follow  Recommended one-time screening for hepatitis C: 1. I have put in the order for your hepatitis C antibody test 2. You can bring the slip with you to your primary care office visit in a month or 2 and have them complete this when they do your other labs 3. Please ask them to fax [place] the result.  Our fax number is [phone]  Overall I recommend:  1. Continue other current medications 2. Return for follow-up in 2 months 3. Call us if you have any questions or concerns   ---------------------------------------------------------------  I am glad you have gotten your COVID-19 vaccination!  Even though you are fully vaccinated you should continue to follow CDC and state/local guidelines.  ---------------------------------------------------------------   At [facility] we value your feedback. You may receive a survey about your visit today. Please share your experience as we strive to create trusting relationships with our patients to provide genuine, compassionate, quality care.  We appreciate your understanding and patience as we review any laboratory studies, imaging, and other diagnostic tests that are ordered as we care for you. Our office policy is 5 business days for review of these results, and any  emergent or urgent results are addressed in a timely manner for your best interest. If you do not hear from our office in 1 week, please contact us.   We also encourage the use of MyChart, which contains your medical information for your review as well. If you are not enrolled in this feature, an access code is on this after visit summary for your convenience. Thank you for allowing [place] to be involved in your care.  It was great to see you today!  I hope you have a GREAT New Year!!

## 2021-01-03 ENCOUNTER — Encounter: Payer: Self-pay | Admitting: Internal Medicine

## 2021-01-03 ENCOUNTER — Ambulatory Visit (HOSPITAL_COMMUNITY): Payer: Medicare Other

## 2021-01-03 NOTE — Progress Notes (Signed)
Cc'ed to pcp °

## 2021-01-04 ENCOUNTER — Other Ambulatory Visit (INDEPENDENT_AMBULATORY_CARE_PROVIDER_SITE_OTHER): Payer: Self-pay | Admitting: Internal Medicine

## 2021-01-04 ENCOUNTER — Encounter (INDEPENDENT_AMBULATORY_CARE_PROVIDER_SITE_OTHER): Payer: Self-pay | Admitting: Internal Medicine

## 2021-01-04 MED ORDER — PANTOPRAZOLE SODIUM 40 MG PO TBEC: 40.0000 mg | DELAYED_RELEASE_TABLET | Freq: Two times a day (BID) | ORAL | 1 refills | Status: DC

## 2021-01-05 ENCOUNTER — Ambulatory Visit (HOSPITAL_COMMUNITY): Payer: Medicare Other

## 2021-01-09 ENCOUNTER — Telehealth (INDEPENDENT_AMBULATORY_CARE_PROVIDER_SITE_OTHER): Payer: Self-pay

## 2021-01-09 ENCOUNTER — Other Ambulatory Visit (INDEPENDENT_AMBULATORY_CARE_PROVIDER_SITE_OTHER): Payer: Self-pay | Admitting: Internal Medicine

## 2021-01-10 ENCOUNTER — Encounter (INDEPENDENT_AMBULATORY_CARE_PROVIDER_SITE_OTHER): Payer: Self-pay | Admitting: Internal Medicine

## 2021-01-10 ENCOUNTER — Telehealth (INDEPENDENT_AMBULATORY_CARE_PROVIDER_SITE_OTHER): Payer: Medicare Other | Admitting: Internal Medicine

## 2021-01-10 VITALS — BP 125/79 | HR 77 | Temp 97.0°F | Ht 70.0 in

## 2021-01-10 DIAGNOSIS — R5381 Other malaise: Secondary | ICD-10-CM | POA: Diagnosis not present

## 2021-01-10 DIAGNOSIS — R5383 Other fatigue: Secondary | ICD-10-CM

## 2021-01-10 NOTE — Progress Notes (Signed)
Metrics: Intervention Frequency ACO  Documented Smoking Status Yearly  Screened one or more times in 24 months  Cessation Counseling or  Active cessation medication Past 24 months  Past 24 months   Guideline developer: UpToDate (See UpToDate for funding source) Date Released: 2014       Wellness Office Visit  Subjective:  Patient ID: Sydney Taylor, female    DOB: 08/17/1966  Age: 55 y.o. MRN: [id number]  CC: This is an audio telemedicine visit with the permission of the patient was at home and I am in my office.  I used 2 identifiers to identify the patient. Fatigue. HPI This patient describes malaise and fatigue for the last 2 to 3 months and not something that is more acute.  She has been tested for COVID-19 disease 2 weeks ago and also 1 week ago and both times the test was negative.  She has been fully vaccinated but has not received her booster. Blood work was done by Judson Roch about 3 weeks ago and this showed low free T3 levels as well as suboptimal testosterone levels.  Her C-reactive protein was significantly elevated at 11.7.  Past Medical History:  Diagnosis Date  . Anemia   . Anxiety   . Arrhythmia   . Bipolar depression ([place]) 12/28/2019  . Chronic fatigue   . Chronic headaches   . Depression   . Diabetes mellitus without complication ([place])   . Fibromyalgia   . GERD (gastroesophageal reflux disease)   . High cholesterol   . HLD (hyperlipidemia)   . Hypertension   . Insect bite   . Migraine   . Prediabetes   . Sleep apnea   . Urinary incontinence    Past Surgical History:  Procedure Laterality Date  . ESOPHAGOGASTRODUODENOSCOPY N/A 05/12/2020   Procedure: ESOPHAGOGASTRODUODENOSCOPY (EGD);  Surgeon: Daneil Dolin, MD;  [facility];  Service: Endoscopy;  Laterality: N/A;  11:15am  . Joint fusion on foot Right   . MALONEY DILATION N/A 05/12/2020   Procedure: Venia Minks DILATION;  Surgeon: Daneil Dolin, MD;  [facility];  Service: Endoscopy;   Laterality: N/A;  . NASAL SEPTUM SURGERY       Family History  Problem Relation Age of Onset  . Hypertension Father   . Hyperlipidemia Father   . Diabetes Father   . Stroke Father   . Depression Father   . Anxiety disorder Father   . Hypertension Mother   . Hyperlipidemia Mother   . Diabetes Mellitus II Mother   . Congestive Heart Failure Mother   . Heart attack Mother   . Hypertension Sister   . Diabetes Sister   . Hypertension Brother   . Diabetes Maternal Grandmother   . Lupus Paternal Grandmother   . Depression Paternal Grandmother   . Anxiety disorder Paternal Grandmother   . Depression Maternal Aunt   . Anxiety disorder Maternal Aunt   . Colon cancer Neg Hx   . Esophageal cancer Neg Hx   . Gastric cancer Neg Hx     Social History   Social History Narrative   Married for 3 years.Lives with husband.Bachelors in Education officer, [employer].   Right handed   Caffeine: 1-2 per day    Social History   Tobacco Use  . Smoking status: Former Smoker    Types: Cigarettes    Quit date: 12/30/1982    Years since quitting: 38.0  . Smokeless tobacco: Never Used  Substance Use Topics  . Alcohol use: Not Currently  Alcohol/week: 0.0 standard drinks    Comment: 1-2 per year     Current Meds  Medication Sig  . ALPRAZolam (XANAX) 0.5 MG tablet Take 0.5 mg by mouth daily as needed.  Marland Kitchen buPROPion (WELLBUTRIN SR) 100 MG 12 hr tablet Take 100 mg by mouth at bedtime.  . chlorthalidone (HYGROTON) 25 MG tablet Take 25 mg by mouth daily.  . Cholecalciferol (VITAMIN D3) 125 MCG (5000 UT) TABS TAKE 2 TABLETS BY MOUTH EVERY DAY  . Cyanocobalamin (VITAMIN B12) 1000 MCG TBCR 1 tablet  . escitalopram (LEXAPRO) 20 MG tablet Take 20 mg by mouth daily.  Marland Kitchen estradiol (ESTRACE) 1 MG tablet 1 tablet  . fluticasone (FLONASE) 50 MCG/ACT nasal spray Place 2 sprays into both nostrils daily.  Marland Kitchen glipiZIDE (GLUCOTROL) 10 MG tablet TAKE 1 TABLET BY MOUTH TWICE A DAY BEFORE A MEAL  . losartan (COZAAR) 25 MG tablet  TAKE 1 TABLET BY MOUTH EVERY DAY  . metoprolol tartrate (LOPRESSOR) 50 MG tablet Take 1 tablet (50 mg total) by mouth 2 (two) times daily.  . pantoprazole (PROTONIX) 40 MG tablet Take 1 tablet (40 mg total) by mouth 2 (two) times daily.  . potassium chloride SA (KLOR-CON) 20 MEQ tablet Take by mouth. Take 2 tablets in the morning and 1 tablet in the evening.  . pregabalin (LYRICA) 50 MG capsule Take 50 mg by mouth at bedtime.  . progesterone (PROMETRIUM) 200 MG capsule TAKE 1 CAPSULE BY MOUTH EVERY DAY      Depression screen [facility] 2/9 12/18/2020 07/20/2020 04/06/2020 03/27/2020  Decreased Interest 3 0 0 0  Down, Depressed, Hopeless 3 3 0 0  PHQ - 2 Score 6 3 0 0  Altered sleeping 3 3 - -  Tired, decreased energy 3 3 - -  Change in appetite 3 3 - -  Feeling bad or failure about yourself  1 3 - -  Trouble concentrating 3 3 - -  Moving slowly or fidgety/restless 0 0 - -  Suicidal thoughts 1 3 - -  PHQ-9 Score 20 21 - -  Difficult doing work/chores Somewhat difficult - - -     Objective:   Today's Vitals: BP 125/79   Pulse 77   Temp (!) 97 F (36.1 C) (Oral)   Ht 5\' 10"  (1.778 m)   LMP  (LMP Unknown)   BMI 36.45 kg/m  Vitals with BMI 01/10/2021 01/02/2021 12/27/2020  Height 5\' 10"  5\' 10"  5\' 10"   Weight (No Data) 254 lbs 250 lbs  BMI - 16.10 96.04  Systolic 540 981 191  Diastolic 79 70 80  Pulse 77 70 67  Some encounter information is confidential and restricted. Go to Review Flowsheets activity to see all data.     Physical Exam   She appears to be alert and oriented on the phone.    Assessment   1. Malaise and fatigue       Tests ordered No orders of the defined types were placed in this encounter.    Plan: 1. As this does not seem to be an acute issue, and she does not appear to have COVID-19 disease, I have told the patient that we will address these chronic issues when I see her next in early February.  She is agreeable to this. 2. This phone call lasted 5  minutes.   No orders of the defined types were placed in this encounter.   Doree Albee, MD

## 2021-01-11 ENCOUNTER — Ambulatory Visit (HOSPITAL_COMMUNITY): Payer: Medicare Other

## 2021-01-11 NOTE — Progress Notes (Signed)
 Office Visit Note  Patient: Sydney Taylor             Date of Birth: 06/12/1966           MRN: [id number]             PCP: Gosrani, Nimish C, MD Referring: Gray, Sarah E, NP Visit Date: 01/12/2021  Subjective:  New Patient (Initial Visit) and Joint Pain (Bilateral knees; bilateral shoulders; bilateral elbows. Patient complains of waves of hot flashes and sweats with no fever. )   History of Present Illness: Sydney Taylor is a 54 y.o. female here for evaluation of fatigue as well as dysphoria, arthralgias, and history of fibromyalgia syndrome. She has had fibromyalgia symptoms with chronic fatigue and generalized pain coming and going for decades but is now feeling a lot of new aches during the past year. She was hospitalized for psychiatric illness early last year but feels those problems to be fairly well controlled now without current SI or worse than usual depression. However since around September of last year she feels generalized body pains and achy or flulike fatigue and pain. She does not recall any preceding event or medical or social situation change correlating with this change. She has episodic hot and cold sensation or flashes without objective fevers. She has pain worst over the shoulders, elbows, and knees not usually associated with much swelling. She has perviously taken gabapentin but had too much drowsiness with effective doses to use these long temr. She does not take SNRI treatments due to existing serotonergic drug treatments for her biopolar symptoms. She has headaches occasionally, chronic fatigue, fairly frequent diarrhea, and difficulty with concentration and memory.   11/2020 ESR wnl CRP 11.7  08/2020 ANA neg RF neg  Activities of Daily Living:  Patient reports morning stiffness for 24 hours.   Patient Reports nocturnal pain.  Difficulty dressing/grooming: Denies Difficulty climbing stairs: Reports Difficulty getting out of chair: Denies Difficulty using  hands for taps, buttons, cutlery, and/or writing: Reports  Review of Systems  Constitutional: Positive for fatigue.  HENT: Positive for mouth dryness and nose dryness. Negative for mouth sores.   Eyes: Positive for pain, itching, visual disturbance and dryness.  Respiratory: Negative for cough, hemoptysis, shortness of breath and difficulty breathing.   Cardiovascular: Negative for chest pain, palpitations and swelling in legs/feet.  Gastrointestinal: Positive for abdominal pain and diarrhea. Negative for blood in stool and constipation.  Endocrine: Negative for increased urination.  Genitourinary: Negative for painful urination.  Musculoskeletal: Positive for arthralgias, joint pain, joint swelling, myalgias, muscle weakness, morning stiffness, muscle tenderness and myalgias.  Skin: Positive for redness. Negative for color change and rash.  Allergic/Immunologic: Negative for susceptible to infections.  Neurological: Positive for headaches and memory loss. Negative for dizziness, numbness and weakness.  Hematological: Negative for swollen glands.  Psychiatric/Behavioral: Positive for confusion. Negative for sleep disturbance.    PMFS History:  Patient Active Problem List   Diagnosis Date Noted  . Fibromyalgia syndrome 01/12/2021  . Elevated C-reactive protein (CRP) 01/12/2021  . Need for hepatitis C screening test 01/02/2021  . Abdominal pain 01/02/2021  . Diverticulosis of colon without hemorrhage 01/02/2021  . Unsteady gait when walking 12/13/2020  . Major depressive disorder, single episode, severe (HCC) 07/14/2020  . Major depression 07/12/2020  . Dysphagia 05/11/2020  . GERD (gastroesophageal reflux disease) 05/11/2020  . Type 2 diabetes mellitus (HCC) 03/27/2020  . Bipolar depression (HCC) 12/28/2019  . Sleep apnea 12/28/2019  . Acute lower UTI 10/31/2019  .   Hypokalemia 10/31/2019  . Tachycardia 10/31/2019  . Urinary tract infection without hematuria   . Hyperlipidemia  10/30/2015  . Obesity due to excess calories 10/30/2015    Past Medical History:  Diagnosis Date  . Anemia   . Anxiety   . Arrhythmia   . Bipolar depression ([place]) 12/28/2019  . Chronic fatigue   . Chronic headaches   . Depression   . Diabetes mellitus without complication ([place])   . Fibromyalgia   . GERD (gastroesophageal reflux disease)   . High cholesterol   . HLD (hyperlipidemia)   . Hypertension   . Insect bite   . Migraine   . Prediabetes   . Sleep apnea   . Urinary incontinence     Family History  Problem Relation Age of Onset  . Hypertension Father   . Hyperlipidemia Father   . Diabetes Father   . Stroke Father   . Depression Father   . Anxiety disorder Father   . Hypertension Mother   . Hyperlipidemia Mother   . Diabetes Mellitus II Mother   . Congestive Heart Failure Mother   . Heart attack Mother   . Hypertension Sister   . Diabetes Sister   . Hypertension Brother   . Autoimmune disease Brother   . Diabetes Maternal Grandmother   . Lupus Paternal Grandmother   . Depression Paternal Grandmother   . Anxiety disorder Paternal Grandmother   . Depression Maternal Aunt   . Anxiety disorder Maternal Aunt   . Colon cancer Neg Hx   . Esophageal cancer Neg Hx   . Gastric cancer Neg Hx    Past Surgical History:  Procedure Laterality Date  . ESOPHAGOGASTRODUODENOSCOPY N/A 05/12/2020   Procedure: ESOPHAGOGASTRODUODENOSCOPY (EGD);  Surgeon: Daneil Dolin, MD;  [facility];  Service: Endoscopy;  Laterality: N/A;  11:15am  . Joint fusion on foot Right   . MALONEY DILATION N/A 05/12/2020   Procedure: Venia Minks DILATION;  Surgeon: Daneil Dolin, MD;  [facility];  Service: Endoscopy;  Laterality: N/A;  . NASAL SEPTUM SURGERY     Social History   Social History Narrative   Married for 3 years.Lives with husband.Bachelors in Education officer, [employer].   Right handed   Caffeine: 1-2 per day    Immunization History  Administered Date(s) Administered  .  Influenza,inj,Quad PF,6+ Mos 11/01/2019  . Influenza-Unspecified 10/30/2020  . Moderna Sars-Covid-2 Vaccination 05/13/2020, 06/23/2020  . Pneumococcal Polysaccharide-23 12/18/2020     Objective: Vital Signs: BP 123/83 (BP Location: Right Arm, Patient Position: Sitting, Cuff Size: Normal)   Pulse 80   Ht 5' 9" (1.753 m)   Wt 258 lb (117 kg)   LMP  (LMP Unknown)   BMI 38.10 kg/m    Physical Exam Constitutional:      Appearance: She is obese.  HENT:     Head: Normocephalic.     Right Ear: External ear normal.     Left Ear: External ear normal.     Mouth/Throat:     Mouth: Mucous membranes are moist.     Pharynx: Oropharynx is clear.  Eyes:     Conjunctiva/sclera: Conjunctivae normal.  Cardiovascular:     Rate and Rhythm: Normal rate and regular rhythm.  Pulmonary:     Effort: Pulmonary effort is normal.     Breath sounds: Normal breath sounds.  Skin:    General: Skin is warm and dry.     Findings: No rash.  Neurological:     General: No focal deficit  present.     Mental Status: She is alert.     Deep Tendon Reflexes: Reflexes normal.     Comments: Mild hand tremor  Psychiatric:     Comments: Flat affect     Musculoskeletal Exam:  Neck full range of motion, stiffness and tenderness in base of neck paraspinal muscles Shoulder ROM intact some pain with abduction and internal rotation Elbow, wrist, fingers full range of motion no swelling  Diffuse paraspinal tenderness and upper back muscle tenderness, also lumbar paraspinal tenderness Normal hip internal and external rotation Knees, ankles full range of motion no swelling   Investigation: No additional findings.  Imaging: No results found.  Recent Labs: Lab Results  Component Value Date   WBC 9.7 09/01/2020   HGB 14.8 09/01/2020   PLT 279 09/01/2020   NA 142 09/01/2020   K 4.2 09/01/2020   CL 101 09/01/2020   CO2 28 09/01/2020   GLUCOSE 131 (H) 09/01/2020   BUN 17 09/01/2020   CREATININE 0.98  09/01/2020   BILITOT 0.6 07/31/2020   ALKPHOS 50 07/12/2020   AST 17 07/31/2020   ALT 22 07/31/2020   PROT 6.3 07/31/2020   ALBUMIN 3.3 (L) 07/12/2020   CALCIUM 10.2 09/01/2020   GFRAA 75 09/01/2020    Speciality Comments: No specialty comments available.  Procedures:  No procedures performed Allergies: Cephalexin   Assessment / Plan:     Visit Diagnoses: Fibromyalgia syndrome Elevated CRP  Mild CRP elevation at outside labs but no inflammatory joint changes are seen on exam today. ROS and history are not strongly suggestive for inflammatory disease. Cannot entirely exclude process such as CPDD that could be between flares but nothing currently. She has a lot of myofascial type pain in the back and shoulders currently with increased muscle tone, tenderness to pressure, and preserved but stiff ROM. Recommend her to review [place] fibroguide site for nonpharmacologic self care recommendations. Referral to physical therapy for evaluation and exercise recommendations.   Orders: Orders Placed This Encounter  Procedures  . Ambulatory referral to Physical Therapy   No orders of the defined types were placed in this encounter.    Follow-Up Instructions: Return in about 4 weeks (around 02/09/2021) for Myofascial pain, tremo, inflammatory labs.    W , MD  Note - This record has been created using Dragon software.  Chart creation errors have been sought, but may not always  have been located. Such creation errors do not reflect on  the standard of medical care.  

## 2021-01-12 ENCOUNTER — Ambulatory Visit: Payer: Medicare Other | Admitting: Internal Medicine

## 2021-01-12 ENCOUNTER — Other Ambulatory Visit: Payer: Self-pay

## 2021-01-12 ENCOUNTER — Encounter: Payer: Self-pay | Admitting: Internal Medicine

## 2021-01-12 VITALS — BP 123/83 | HR 80 | Ht 69.0 in | Wt 258.0 lb

## 2021-01-12 DIAGNOSIS — M797 Fibromyalgia: Secondary | ICD-10-CM

## 2021-01-12 DIAGNOSIS — F319 Bipolar disorder, unspecified: Secondary | ICD-10-CM

## 2021-01-12 DIAGNOSIS — R7982 Elevated C-reactive protein (CRP): Secondary | ICD-10-CM | POA: Diagnosis not present

## 2021-01-12 NOTE — Patient Instructions (Signed)
I recommend checking out the [facility] patient-centered guide for fibromyalgia and chronic pain management: [employer]ark[employer].com.ee  I am referring you for physical therapy for muscle and joint pains and limited range of motion   Myofascial Pain Syndrome and Fibromyalgia Myofascial pain syndrome and fibromyalgia are both pain disorders. This pain may be felt mainly in your muscles.  Myofascial pain syndrome: ? Always has tender points in the muscle that will cause pain when pressed (trigger points). The pain may come and go. ? Usually affects your neck, upper back, and shoulder areas. The pain often radiates into your arms and hands.  Fibromyalgia: ? Has muscle pains and tenderness that come and go. ? Is often associated with fatigue and sleep problems. ? Has trigger points. ? Tends to be long-lasting (chronic), but is not life-threatening. Fibromyalgia and myofascial pain syndrome are not the same. However, they often occur together. If you have both conditions, each can make the other worse. Both are common and can cause enough pain and fatigue to make day-to-day activities difficult. Both can be hard to diagnose because their symptoms are common in many other conditions. What are the causes? The exact causes of these conditions are not known. What increases the risk? You are more likely to develop this condition if:  You have a family history of the condition.  You have certain triggers, such as: ? Spine disorders. ? An injury (trauma) or other physical stressors. ? Being under a lot of stress. ? Medical conditions such as osteoarthritis, rheumatoid arthritis, or lupus. What are the signs or symptoms? Fibromyalgia The main symptom of fibromyalgia is widespread pain and tenderness in your muscles. Pain is sometimes described as stabbing, shooting, or burning. You may also have:  Tingling or numbness.  Sleep problems and fatigue.  Problems with attention and  concentration (fibro fog). Other symptoms may include:  Bowel and bladder problems.  Headaches.  Visual problems.  Problems with odors and noises.  Depression or mood changes.  Painful menstrual periods (dysmenorrhea).  Dry skin or eyes. These symptoms can vary over time. Myofascial pain syndrome Symptoms of myofascial pain syndrome include:  Tight, ropy bands of muscle.  Uncomfortable sensations in muscle areas. These may include aching, cramping, burning, numbness, tingling, and weakness.  Difficulty moving certain parts of the body freely (poor range of motion). How is this diagnosed? This condition may be diagnosed by your symptoms and medical history. You will also have a physical exam. In general:  Fibromyalgia is diagnosed if you have pain, fatigue, and other symptoms for more than 3 months, and symptoms cannot be explained by another condition.  Myofascial pain syndrome is diagnosed if you have trigger points in your muscles, and those trigger points are tender and cause pain elsewhere in your body (referred pain). How is this treated? Treatment for these conditions depends on the type that you have.  For fibromyalgia: ? Pain medicines, such as NSAIDs. ? Medicines for treating depression. ? Medicines for treating seizures. ? Medicines that relax the muscles.  For myofascial pain: ? Pain medicines, such as NSAIDs. ? Cooling and stretching of muscles. ? Trigger point injections. ? Sound wave (ultrasound) treatments to stimulate muscles. Treating these conditions often requires a team of health care providers. These may include:  Your primary care provider.  Physical therapist.  Complementary health care providers, such as massage therapists or acupuncturists.  Psychiatrist for cognitive behavioral therapy.   Follow these instructions at home: Medicines  Take over-the-counter and prescription medicines  only as told by your health care provider.  Do not  drive or use heavy machinery while taking prescription pain medicine.  If you are taking prescription pain medicine, take actions to prevent or treat constipation. Your health care provider may recommend that you: ? Drink enough fluid to keep your urine pale yellow. ? Eat foods that are high in fiber, such as fresh fruits and vegetables, whole grains, and beans. ? Limit foods that are high in fat and processed sugars, such as fried or sweet foods. ? Take an over-the-counter or prescription medicine for constipation. Lifestyle  Exercise as directed by your health care provider or physical therapist.  Practice relaxation techniques to control your stress. You may want to try: ? Biofeedback. ? Visual imagery. ? Hypnosis. ? Muscle relaxation. ? Yoga. ? Meditation.  Maintain a healthy lifestyle. This includes eating a healthy diet and getting enough sleep.  Do not use any products that contain nicotine or tobacco, such as cigarettes and e-cigarettes. If you need help quitting, ask your health care provider.   General instructions  Talk to your health care provider about complementary treatments, such as acupuncture or massage.  Consider joining a support group with others who are diagnosed with this condition.  Do not do activities that stress or strain your muscles. This includes repetitive motions and heavy lifting.  Keep all follow-up visits as told by your health care provider. This is important. Where to find more information  National Fibromyalgia Association: www.fmaware.[place]: www.arthritis.org  American Chronic Pain Association: www.theacpa.org Contact a health care provider if:  You have new symptoms.  Your symptoms get worse or your pain is severe.  You have side effects from your medicines.  You have trouble sleeping.  Your condition is causing depression or anxiety. Summary  Myofascial pain syndrome and fibromyalgia are pain  disorders.  Myofascial pain syndrome has tender points in the muscle that will cause pain when pressed (trigger points). Fibromyalgia also has muscle pains and tenderness that come and go, but this condition is often associated with fatigue and sleep disturbances.  Fibromyalgia and myofascial pain syndrome are not the same but often occur together, causing pain and fatigue that make day-to-day activities difficult.  Treatment for fibromyalgia includes taking medicines to relax the muscles and medicines for pain, depression, or seizures. Treatment for myofascial pain syndrome includes taking medicines for pain, cooling and stretching of muscles, and injecting medicines into trigger points.  Follow your health care provider's instructions for taking medicines and maintaining a healthy lifestyle. This information is not intended to replace advice given to you by your health care provider. Make sure you discuss any questions you have with your health care provider. Document Revised: 04/09/2019 Document Reviewed: 12/31/2017 Elsevier Patient Education  2021 [employer].

## 2021-01-17 ENCOUNTER — Ambulatory Visit (HOSPITAL_COMMUNITY): Payer: Medicare Other

## 2021-01-18 ENCOUNTER — Ambulatory Visit (HOSPITAL_COMMUNITY): Payer: Medicare Other

## 2021-01-19 ENCOUNTER — Ambulatory Visit (HOSPITAL_COMMUNITY): Admission: RE | Admit: 2021-01-19 | Payer: Medicare Other | Source: Ambulatory Visit

## 2021-01-21 ENCOUNTER — Encounter (INDEPENDENT_AMBULATORY_CARE_PROVIDER_SITE_OTHER): Payer: Self-pay | Admitting: Internal Medicine

## 2021-01-22 ENCOUNTER — Other Ambulatory Visit (INDEPENDENT_AMBULATORY_CARE_PROVIDER_SITE_OTHER): Payer: Self-pay | Admitting: Internal Medicine

## 2021-01-22 MED ORDER — VITAMIN D3 125 MCG (5000 UT) PO TABS: 2.0000 | ORAL_TABLET | Freq: Every day | ORAL | 1 refills | Status: DC

## 2021-01-22 MED ORDER — CHLORTHALIDONE 25 MG PO TABS
25.0000 mg | ORAL_TABLET | Freq: Every day | ORAL | 0 refills | Status: DC
Start: 2021-01-22 — End: 2021-04-15

## 2021-01-26 ENCOUNTER — Telehealth: Payer: Self-pay

## 2021-01-26 NOTE — Telephone Encounter (Signed)
Patient requested her physical therapy referral be sent to [facility] in [place].

## 2021-01-29 NOTE — Telephone Encounter (Signed)
Please fax referral to [facility] in [place].  Thank you.

## 2021-01-30 ENCOUNTER — Encounter (INDEPENDENT_AMBULATORY_CARE_PROVIDER_SITE_OTHER): Payer: Self-pay | Admitting: Internal Medicine

## 2021-01-30 ENCOUNTER — Telehealth: Payer: Self-pay | Admitting: Internal Medicine

## 2021-01-30 ENCOUNTER — Other Ambulatory Visit: Payer: Self-pay

## 2021-01-30 ENCOUNTER — Ambulatory Visit (INDEPENDENT_AMBULATORY_CARE_PROVIDER_SITE_OTHER): Payer: Medicare Other | Admitting: Internal Medicine

## 2021-01-30 VITALS — BP 112/76 | HR 74 | Temp 97.7°F | Ht 69.0 in | Wt 258.8 lb

## 2021-01-30 DIAGNOSIS — R5383 Other fatigue: Secondary | ICD-10-CM | POA: Diagnosis not present

## 2021-01-30 DIAGNOSIS — F520 Hypoactive sexual desire disorder: Secondary | ICD-10-CM

## 2021-01-30 DIAGNOSIS — Z1159 Encounter for screening for other viral diseases: Secondary | ICD-10-CM

## 2021-01-30 DIAGNOSIS — R5381 Other malaise: Secondary | ICD-10-CM | POA: Diagnosis not present

## 2021-01-30 DIAGNOSIS — R6889 Other general symptoms and signs: Secondary | ICD-10-CM | POA: Diagnosis not present

## 2021-01-30 MED ORDER — NP THYROID 60 MG PO TABS: 60.0000 mg | ORAL_TABLET | Freq: Every day | ORAL | 3 refills | Status: DC

## 2021-01-30 NOTE — Telephone Encounter (Signed)
Sydney Taylor calling because she had her PCP visit today. Sydney Taylor was under the impression Dr. Benjamine Mola was going to send over lab orders for her to have labs drawn at her appointment today. ( we did not have a lab tech available the day of her appointment here.)Per Sydney Taylor, there was no orders in the system, so she did not have any drawn today.  Sydney Taylor needs to know what to do at this point. Please call to advise.

## 2021-01-30 NOTE — Progress Notes (Signed)
Metrics: Intervention Frequency ACO  Documented Smoking Status Yearly  Screened one or more times in 24 months  Cessation Counseling or  Active cessation medication Past 24 months  Past 24 months   Guideline developer: UpToDate (See UpToDate for funding source) Date Released: 2014       Wellness Office Visit  Subjective:  Patient ID: Sydney Taylor, female    DOB: 02/05/1966  Age: 55 y.o. MRN: [id number]  CC: Malaise and fatigue, sexual dysfunction. HPI  The patient is frustrated that she feels extremely tired all the time.  She also describes lack of clitoral stimulation and decreased libido. I reviewed with her blood work with a suboptimal free T3 level previously and also testosterone levels that were also suboptimal although in the normal range. She is tolerating estradiol and progesterone but these are at low doses. Past Medical History:  Diagnosis Date  . Anemia   . Anxiety   . Arrhythmia   . Bipolar depression ([place]) 12/28/2019  . Chronic fatigue   . Chronic headaches   . Depression   . Diabetes mellitus without complication ([place])   . Fibromyalgia   . GERD (gastroesophageal reflux disease)   . High cholesterol   . HLD (hyperlipidemia)   . Hypertension   . Insect bite   . Migraine   . Prediabetes   . Sleep apnea   . Urinary incontinence    Past Surgical History:  Procedure Laterality Date  . ESOPHAGOGASTRODUODENOSCOPY N/A 05/12/2020   Procedure: ESOPHAGOGASTRODUODENOSCOPY (EGD);  Surgeon: Daneil Dolin, MD;  [facility];  Service: Endoscopy;  Laterality: N/A;  11:15am  . Joint fusion on foot Right   . MALONEY DILATION N/A 05/12/2020   Procedure: Venia Minks DILATION;  Surgeon: Daneil Dolin, MD;  [facility];  Service: Endoscopy;  Laterality: N/A;  . NASAL SEPTUM SURGERY       Family History  Problem Relation Age of Onset  . Hypertension Father   . Hyperlipidemia Father   . Diabetes Father   . Stroke Father   . Depression Father    . Anxiety disorder Father   . Hypertension Mother   . Hyperlipidemia Mother   . Diabetes Mellitus II Mother   . Congestive Heart Failure Mother   . Heart attack Mother   . Hypertension Sister   . Diabetes Sister   . Hypertension Brother   . Autoimmune disease Brother   . Diabetes Maternal Grandmother   . Lupus Paternal Grandmother   . Depression Paternal Grandmother   . Anxiety disorder Paternal Grandmother   . Depression Maternal Aunt   . Anxiety disorder Maternal Aunt   . Colon cancer Neg Hx   . Esophageal cancer Neg Hx   . Gastric cancer Neg Hx     Social History   Social History Narrative   Married for 3 years.Lives with husband.Bachelors in Education officer, [employer].   Right handed   Caffeine: 1-2 per day    Social History   Tobacco Use  . Smoking status: Former Smoker    Types: Cigarettes    Quit date: 12/30/1982    Years since quitting: 38.1  . Smokeless tobacco: Never Used  Substance Use Topics  . Alcohol use: Not Currently    Alcohol/week: 0.0 standard drinks    Current Meds  Medication Sig  . ALPRAZolam (XANAX) 0.5 MG tablet Take 0.5 mg by mouth daily as needed.  Marland Kitchen buPROPion (WELLBUTRIN SR) 100 MG 12 hr tablet Take 100 mg by mouth at bedtime.  Marland Kitchen  chlorthalidone (HYGROTON) 25 MG tablet Take 1 tablet (25 mg total) by mouth daily.  . Cholecalciferol (VITAMIN D3) 125 MCG (5000 UT) TABS Take 2 tablets (10,000 Units total) by mouth daily.  . Cyanocobalamin (VITAMIN B12) 1000 MCG TBCR 1 tablet  . escitalopram (LEXAPRO) 20 MG tablet Take 20 mg by mouth daily.  Marland Kitchen estradiol (ESTRACE) 0.5 MG tablet TAKE 1 TABLET (0.5 MG TOTAL) BY MOUTH DAILY.  . fluticasone (FLONASE) 50 MCG/ACT nasal spray Place 2 sprays into both nostrils daily.  Marland Kitchen glipiZIDE (GLUCOTROL) 10 MG tablet TAKE 1 TABLET BY MOUTH TWICE A DAY BEFORE A MEAL  . losartan (COZAAR) 25 MG tablet TAKE 1 TABLET BY MOUTH EVERY DAY  . metoprolol tartrate (LOPRESSOR) 50 MG tablet Take 1 tablet (50 mg total) by mouth 2 (two) times  daily.  . NP THYROID 60 MG tablet Take 1 tablet (60 mg total) by mouth daily before breakfast.  . pantoprazole (PROTONIX) 40 MG tablet Take 1 tablet (40 mg total) by mouth 2 (two) times daily.  . potassium chloride SA (KLOR-CON) 20 MEQ tablet Take by mouth. Take 2 tablets in the morning and 1 tablet in the evening.  . pregabalin (LYRICA) 75 MG capsule Take 75 mg by mouth daily.  . progesterone (PROMETRIUM) 200 MG capsule TAKE 1 CAPSULE BY MOUTH EVERY DAY  . thyroid (NP THYROID) 30 MG tablet Take 30 mg by mouth daily before breakfast.      Depression screen [facility] 2/9 12/18/2020 07/20/2020 04/06/2020 03/27/2020  Decreased Interest 3 0 0 0  Down, Depressed, Hopeless 3 3 0 0  PHQ - 2 Score 6 3 0 0  Altered sleeping 3 3 - -  Tired, decreased energy 3 3 - -  Change in appetite 3 3 - -  Feeling bad or failure about yourself  1 3 - -  Trouble concentrating 3 3 - -  Moving slowly or fidgety/restless 0 0 - -  Suicidal thoughts 1 3 - -  PHQ-9 Score 20 21 - -  Difficult doing work/chores Somewhat difficult - - -     Objective:   Today's Vitals: BP 112/76   Pulse 74   Temp 97.7 F (36.5 C) (Temporal)   Ht 5\' 9"  (1.753 m)   Wt 258 lb 12.8 oz (117.4 kg)   LMP  (LMP Unknown)   SpO2 98%   BMI 38.22 kg/m  Vitals with BMI 01/30/2021 01/12/2021 01/12/2021  Height 5\' 9"  - 5\' 9"   Weight 258 lbs 13 oz - 258 lbs  BMI 36.6 - 44.03  Systolic 474 259 563  Diastolic 76 83 77  Pulse 74 80 71  Some encounter information is confidential and restricted. Go to Review Flowsheets activity to see all data.     Physical Exam  She remains obese.  No other new physical findings today.     Assessment   1. Hypoactive sexual desire disorder   2. Malaise and fatigue   3. Encounter for hepatitis C screening test for low risk patient       Tests ordered Orders Placed This Encounter  Procedures  . Estradiol  . Progesterone  . Hepatitis C antibody     Plan: 1. She will continue with estradiol and  progesterone at current doses but we will check levels to see if we need to further optimize. 2. I recommended that she increase the dose of thyroid so that she will be taking NP thyroid 60 mg daily.  I have sent a new prescription  to reflect this.  NP thyroid is being used off label for symptoms of thyroid hypofunction. 3. She is also a good candidate for testosterone therapy and I would recommend testosterone cream.  She has a compounding pharmacy near where she lives and she will give me the information on this pharmacy and I will send a prescription. 4. I will see her for close follow-up in about a month's time to see how she is doing.   Meds ordered this encounter  Medications  . NP THYROID 60 MG tablet    Sig: Take 1 tablet (60 mg total) by mouth daily before breakfast.    Dispense:  30 tablet    Refill:  3    Shahid Flori Luther Parody, MD

## 2021-01-31 ENCOUNTER — Other Ambulatory Visit (INDEPENDENT_AMBULATORY_CARE_PROVIDER_SITE_OTHER): Payer: Self-pay | Admitting: Internal Medicine

## 2021-01-31 LAB — HEPATITIS C ANTIBODY
Hepatitis C Ab: NONREACTIVE
SIGNAL TO CUT-OFF: 0 (ref <1.00)

## 2021-01-31 LAB — ESTRADIOL: Estradiol: 24 pg/mL

## 2021-01-31 LAB — PROGESTERONE: Progesterone: 6 ng/mL

## 2021-01-31 MED ORDER — PROGESTERONE 200 MG PO CAPS: 400.0000 mg | ORAL_CAPSULE | Freq: Every day | ORAL | 3 refills | Status: DC

## 2021-01-31 MED ORDER — ESTRADIOL 1 MG PO TABS: 1.0000 mg | ORAL_TABLET | Freq: Every day | ORAL | 3 refills | Status: DC

## 2021-01-31 NOTE — Telephone Encounter (Signed)
Spoke with patient, advised labs can be drawn at follow up appointment on 02/12/2021 depending upon clinical evaluation. Patient expressed understanding.

## 2021-01-31 NOTE — Telephone Encounter (Signed)
We had discussed repeating her inflammatory lab markers since she had increase in sed rate, CRP which had previously been normal earlier last year to see if these were going up or remaining high or back to normal. She is correct--I don't think we actually ordered this before her PCP visit. If she is coming back to f/u appt this month though can check these at that time if symptoms are no better.

## 2021-01-31 NOTE — Telephone Encounter (Signed)
Per office note on 01/12/2021 it does not appear patient needed to have labs drawn, please confirm that is true or correct me if I'm wrong and I will let patient know. Thanks!

## 2021-02-05 ENCOUNTER — Ambulatory Visit: Payer: Medicare Other | Admitting: Internal Medicine

## 2021-02-10 ENCOUNTER — Encounter (INDEPENDENT_AMBULATORY_CARE_PROVIDER_SITE_OTHER): Payer: Self-pay | Admitting: Internal Medicine

## 2021-02-11 ENCOUNTER — Ambulatory Visit: Payer: Self-pay

## 2021-02-12 ENCOUNTER — Telehealth (INDEPENDENT_AMBULATORY_CARE_PROVIDER_SITE_OTHER): Payer: Medicare Other | Admitting: Internal Medicine

## 2021-02-12 ENCOUNTER — Encounter (INDEPENDENT_AMBULATORY_CARE_PROVIDER_SITE_OTHER): Payer: Self-pay | Admitting: Internal Medicine

## 2021-02-12 ENCOUNTER — Ambulatory Visit: Payer: Medicare Other | Admitting: Internal Medicine

## 2021-02-12 DIAGNOSIS — R0981 Nasal congestion: Secondary | ICD-10-CM

## 2021-02-12 MED ORDER — AZITHROMYCIN 250 MG PO TABS: ORAL_TABLET | ORAL | 0 refills | Status: DC

## 2021-02-12 NOTE — Progress Notes (Signed)
Metrics: Intervention Frequency ACO  Documented Smoking Status Yearly  Screened one or more times in 24 months  Cessation Counseling or  Active cessation medication Past 24 months  Past 24 months   Guideline developer: UpToDate (See UpToDate for funding source) Date Released: 2014       Wellness Office Visit  Subjective:  Patient ID: Sydney Taylor, female    DOB: 1966/11/13  Age: 55 y.o. MRN: [id number]  CC: This is an audio telemedicine visit with the permission of the patient who is at home and I am in my office.  I used 2 identifiers to identify the patient. Sinus congestion. HPI  The patient describes a 1 week history of sinus congestion especially nasal congestion associated with a cough which is productive of slightly purulent sputum.  There is no wheezing or fever.  She took a COVID-19 test 2 days after the onset of symptoms and this was negative and yesterday she took another rapid COVID-19 test at home and this was also negative. Past Medical History:  Diagnosis Date  . Anemia   . Anxiety   . Arrhythmia   . Bipolar depression ([place]) 12/28/2019  . Chronic fatigue   . Chronic headaches   . Depression   . Diabetes mellitus without complication ([place])   . Fibromyalgia   . GERD (gastroesophageal reflux disease)   . High cholesterol   . HLD (hyperlipidemia)   . Hypertension   . Insect bite   . Migraine   . Prediabetes   . Sleep apnea   . Urinary incontinence    Past Surgical History:  Procedure Laterality Date  . ESOPHAGOGASTRODUODENOSCOPY N/A 05/12/2020   Procedure: ESOPHAGOGASTRODUODENOSCOPY (EGD);  Surgeon: Daneil Dolin, MD;  [facility];  Service: Endoscopy;  Laterality: N/A;  11:15am  . Joint fusion on foot Right   . MALONEY DILATION N/A 05/12/2020   Procedure: Venia Minks DILATION;  Surgeon: Daneil Dolin, MD;  [facility];  Service: Endoscopy;  Laterality: N/A;  . NASAL SEPTUM SURGERY       Family History  Problem Relation Age of  Onset  . Hypertension Father   . Hyperlipidemia Father   . Diabetes Father   . Stroke Father   . Depression Father   . Anxiety disorder Father   . Hypertension Mother   . Hyperlipidemia Mother   . Diabetes Mellitus II Mother   . Congestive Heart Failure Mother   . Heart attack Mother   . Hypertension Sister   . Diabetes Sister   . Hypertension Brother   . Autoimmune disease Brother   . Diabetes Maternal Grandmother   . Lupus Paternal Grandmother   . Depression Paternal Grandmother   . Anxiety disorder Paternal Grandmother   . Depression Maternal Aunt   . Anxiety disorder Maternal Aunt   . Colon cancer Neg Hx   . Esophageal cancer Neg Hx   . Gastric cancer Neg Hx     Social History   Social History Narrative   Married for 3 years.Lives with husband.Bachelors in Education officer, [employer].   Right handed   Caffeine: 1-2 per day    Social History   Tobacco Use  . Smoking status: Former Smoker    Types: Cigarettes    Quit date: 12/30/1982    Years since quitting: 38.1  . Smokeless tobacco: Never Used  Substance Use Topics  . Alcohol use: Not Currently    Alcohol/week: 0.0 standard drinks    Current Meds  Medication Sig  . ALPRAZolam (  XANAX) 0.5 MG tablet Take 0.5 mg by mouth daily as needed.  Marland Kitchen azithromycin (ZITHROMAX) 250 MG tablet Take 2 tablets the first day and then 1 tablet every day for the next 4 days  . buPROPion (WELLBUTRIN SR) 100 MG 12 hr tablet Take 100 mg by mouth at bedtime.  . chlorthalidone (HYGROTON) 25 MG tablet Take 1 tablet (25 mg total) by mouth daily.  . Cholecalciferol (VITAMIN D3) 125 MCG (5000 UT) TABS Take 2 tablets (10,000 Units total) by mouth daily.  . Cyanocobalamin (VITAMIN B12) 1000 MCG TBCR 1 tablet  . escitalopram (LEXAPRO) 20 MG tablet Take 20 mg by mouth daily.  Marland Kitchen estradiol (ESTRACE) 1 MG tablet Take 1 tablet (1 mg total) by mouth daily.  . fluticasone (FLONASE) 50 MCG/ACT nasal spray Place 2 sprays into both nostrils daily.  Marland Kitchen glipiZIDE  (GLUCOTROL) 10 MG tablet TAKE 1 TABLET BY MOUTH TWICE A DAY BEFORE A MEAL  . losartan (COZAAR) 25 MG tablet TAKE 1 TABLET BY MOUTH EVERY DAY  . metoprolol tartrate (LOPRESSOR) 50 MG tablet Take 1 tablet (50 mg total) by mouth 2 (two) times daily.  . NP THYROID 60 MG tablet Take 1 tablet (60 mg total) by mouth daily before breakfast.  . pantoprazole (PROTONIX) 40 MG tablet Take 1 tablet (40 mg total) by mouth 2 (two) times daily.  . potassium chloride SA (KLOR-CON) 20 MEQ tablet Take by mouth. Take 2 tablets in the morning and 1 tablet in the evening.  . pregabalin (LYRICA) 75 MG capsule Take 75 mg by mouth daily.  . progesterone (PROMETRIUM) 200 MG capsule Take 2 capsules (400 mg total) by mouth daily.  Marland Kitchen thyroid (ARMOUR) 30 MG tablet Take 30 mg by mouth daily before breakfast.     [facility] Office Visit from 12/18/2020 in [facility]  PHQ-9 Total Score 20      Objective:   Today's Vitals: LMP  (LMP Unknown)  Vitals with BMI 02/12/2021 01/30/2021 01/12/2021  Height (No Data) 5\' 9"  -  Weight (No Data) 258 lbs 13 oz -  BMI - 16.1 -  Systolic (No Data) 096 045  Diastolic (No Data) 76 83  Pulse - 74 80  Some encounter information is confidential and restricted. Go to Review Flowsheets activity to see all data.     Physical Exam   Virtual visit.  She appears to be alert and orientated.    Assessment   1. Sinus congestion       Tests ordered No orders of the defined types were placed in this encounter.    Plan: 1. I will treat her empirically for a sinusitis although this may simply be a viral illness, not COVID-19.  If she does not improve after a couple of weeks, she will let me know. 2. This phone call lasted 5 minutes   Meds ordered this encounter  Medications  . azithromycin (ZITHROMAX) 250 MG tablet    Sig: Take 2 tablets the first day and then 1 tablet every day for the next 4 days    Dispense:  6 tablet    Refill:  0    Tiffanye Hartmann Luther Parody,  MD

## 2021-02-13 ENCOUNTER — Ambulatory Visit (HOSPITAL_COMMUNITY): Payer: Medicare Other

## 2021-02-14 ENCOUNTER — Telehealth (INDEPENDENT_AMBULATORY_CARE_PROVIDER_SITE_OTHER): Payer: Self-pay | Admitting: Internal Medicine

## 2021-02-14 ENCOUNTER — Other Ambulatory Visit (INDEPENDENT_AMBULATORY_CARE_PROVIDER_SITE_OTHER): Payer: Self-pay | Admitting: Internal Medicine

## 2021-02-14 DIAGNOSIS — R238 Other skin changes: Secondary | ICD-10-CM | POA: Diagnosis not present

## 2021-02-14 DIAGNOSIS — L821 Other seborrheic keratosis: Secondary | ICD-10-CM | POA: Diagnosis not present

## 2021-02-14 DIAGNOSIS — R58 Hemorrhage, not elsewhere classified: Secondary | ICD-10-CM | POA: Diagnosis not present

## 2021-02-14 DIAGNOSIS — Z789 Other specified health status: Secondary | ICD-10-CM | POA: Diagnosis not present

## 2021-02-14 DIAGNOSIS — B078 Other viral warts: Secondary | ICD-10-CM | POA: Diagnosis not present

## 2021-02-14 DIAGNOSIS — L538 Other specified erythematous conditions: Secondary | ICD-10-CM | POA: Diagnosis not present

## 2021-02-14 MED ORDER — PREDNISONE 20 MG PO TABS: 40.0000 mg | ORAL_TABLET | Freq: Every day | ORAL | 1 refills | Status: DC

## 2021-02-14 NOTE — Telephone Encounter (Signed)
Patient just called back and stated that the last 2 times that she took prednisone, it raised her sugar to over 400 both times and she was wondering if there was something else that you can give her instead.

## 2021-02-14 NOTE — Telephone Encounter (Signed)
Unfortunately, I have no other options at the present time.  The only other option if she does not want take prednisone is to continue with the antibiotic and finish the course.

## 2021-02-14 NOTE — Telephone Encounter (Signed)
Please let the patient know that she needs to continue to take the antibiotic and finish the course.  I have also sent a 5-day course of prednisone to the [facility] pharmacy in [place] which she can start tomorrow morning.  If she does not improve after 1 week, she needs to make an appointment to be seen in the office.

## 2021-02-15 ENCOUNTER — Ambulatory Visit: Payer: Self-pay

## 2021-02-16 ENCOUNTER — Ambulatory Visit
Admission: RE | Admit: 2021-02-16 | Discharge: 2021-02-16 | Disposition: A | Discharge status: Home / Self Care | Payer: Medicare Other | Source: Ambulatory Visit | Attending: Family Medicine | Admitting: Family Medicine

## 2021-02-16 ENCOUNTER — Other Ambulatory Visit: Payer: Self-pay

## 2021-02-16 VITALS — BP 128/82 | HR 91 | Temp 99.1°F | Resp 19

## 2021-02-16 DIAGNOSIS — J069 Acute upper respiratory infection, unspecified: Secondary | ICD-10-CM

## 2021-02-16 MED ORDER — AMOXICILLIN-POT CLAVULANATE 875-125 MG PO TABS: 1.0000 | ORAL_TABLET | Freq: Two times a day (BID) | ORAL | 0 refills | Status: AC

## 2021-02-16 NOTE — Discharge Instructions (Addendum)
I have sent in Augmentin for you to take twice a day for 7 days.  Follow up with this office or with primary care if symptoms are persisting.  Follow up in the ER for high fever, trouble swallowing, trouble breathing, other concerning symptoms.  

## 2021-02-16 NOTE — ED Provider Notes (Signed)
[facility]    CSN: [id number] Arrival date & time: 02/16/21  1245      History   Chief Complaint No chief complaint on file.   HPI Sydney Taylor is a 55 y.o. female.   Reports that she has been having worsening cough, sinus pain and pressure, nasal congestion, fatigue over the last week. States that she is on the last day of a Zpack from her PCP and that she feels worse now than she did before she started the medication. Denies nausea, vomiting, diarrhea, rash, fever, other symptoms.   ROS per HPI  The history is provided by the patient.    Past Medical History:  Diagnosis Date  . Anemia   . Anxiety   . Arrhythmia   . Bipolar depression ([place]) 12/28/2019  . Chronic fatigue   . Chronic headaches   . Depression   . Diabetes mellitus without complication ([place])   . Fibromyalgia   . GERD (gastroesophageal reflux disease)   . High cholesterol   . HLD (hyperlipidemia)   . Hypertension   . Insect bite   . Migraine   . Prediabetes   . Sleep apnea   . Urinary incontinence     Patient Active Problem List   Diagnosis Date Noted  . Fibromyalgia syndrome 01/12/2021  . Elevated C-reactive protein (CRP) 01/12/2021  . Need for hepatitis C screening test 01/02/2021  . Abdominal pain 01/02/2021  . Diverticulosis of colon without hemorrhage 01/02/2021  . Unsteady gait when walking 12/13/2020  . Major depressive disorder, single episode, severe ([place]) 07/14/2020  . Major depression 07/12/2020  . Dysphagia 05/11/2020  . GERD (gastroesophageal reflux disease) 05/11/2020  . Type 2 diabetes mellitus ([place]) 03/27/2020  . Bipolar depression ([place]) 12/28/2019  . Sleep apnea 12/28/2019  . Acute lower UTI 10/31/2019  . Hypokalemia 10/31/2019  . Tachycardia 10/31/2019  . Urinary tract infection without hematuria   . Hyperlipidemia 10/30/2015  . Obesity due to excess calories 10/30/2015    Past Surgical History:  Procedure Laterality Date  .  ESOPHAGOGASTRODUODENOSCOPY N/A 05/12/2020   Procedure: ESOPHAGOGASTRODUODENOSCOPY (EGD);  Surgeon: Daneil Dolin, MD;  [facility];  Service: Endoscopy;  Laterality: N/A;  11:15am  . Joint fusion on foot Right   . MALONEY DILATION N/A 05/12/2020   Procedure: Venia Minks DILATION;  Surgeon: Daneil Dolin, MD;  [facility];  Service: Endoscopy;  Laterality: N/A;  . NASAL SEPTUM SURGERY      OB History    Gravida      Para      Term      Preterm      AB      Living  0     SAB      IAB      Ectopic      Multiple      Live Births               Home Medications    Prior to Admission medications   Medication Sig Start Date End Date Taking? Authorizing Provider  amoxicillin-clavulanate (AUGMENTIN) 875-125 MG tablet Take 1 tablet by mouth 2 (two) times daily for 10 days. 02/16/21 02/26/21 Yes Faustino Congress, NP  ALPRAZolam Duanne Moron) 0.5 MG tablet Take 0.5 mg by mouth daily as needed. 07/05/20   [provider]  buPROPion (WELLBUTRIN SR) 100 MG 12 hr tablet Take 100 mg by mouth at bedtime. 12/06/20   [provider]  chlorthalidone (HYGROTON) 25 MG tablet Take  1 tablet (25 mg total) by mouth daily. 01/22/21   Doree Albee, MD  Cholecalciferol (VITAMIN D3) 125 MCG (5000 UT) TABS Take 2 tablets (10,000 Units total) by mouth daily. 01/22/21   Doree Albee, MD  Cyanocobalamin (VITAMIN B12) 1000 MCG TBCR 1 tablet    [provider]  escitalopram (LEXAPRO) 20 MG tablet Take 20 mg by mouth daily. 10/13/20   [provider]  estradiol (ESTRACE) 1 MG tablet Take 1 tablet (1 mg total) by mouth daily. 01/31/21   Doree Albee, MD  fluticasone (FLONASE) 50 MCG/ACT nasal spray Place 2 sprays into both nostrils daily. 09/12/20   Ailene Ards, NP  glipiZIDE (GLUCOTROL) 10 MG tablet TAKE 1 TABLET BY MOUTH TWICE A DAY BEFORE A MEAL 12/30/20   Anastasio Champion, Nimish C, MD  losartan (COZAAR) 25 MG tablet TAKE 1 TABLET BY MOUTH EVERY DAY  12/08/20   Hurshel Party C, MD  metoprolol tartrate (LOPRESSOR) 50 MG tablet Take 1 tablet (50 mg total) by mouth 2 (two) times daily. 11/30/20   Ailene Ards, NP  NP THYROID 60 MG tablet Take 1 tablet (60 mg total) by mouth daily before breakfast. 01/30/21   Hurshel Party C, MD  pantoprazole (PROTONIX) 40 MG tablet Take 1 tablet (40 mg total) by mouth 2 (two) times daily. 01/04/21   Doree Albee, MD  potassium chloride SA (KLOR-CON) 20 MEQ tablet Take by mouth. Take 2 tablets in the morning and 1 tablet in the evening.    [provider]  predniSONE (DELTASONE) 20 MG tablet Take 2 tablets (40 mg total) by mouth daily with breakfast. 02/14/21   Hurshel Party C, MD  pregabalin (LYRICA) 75 MG capsule Take 75 mg by mouth daily. 01/26/21   [provider]  progesterone (PROMETRIUM) 200 MG capsule Take 2 capsules (400 mg total) by mouth daily. 01/31/21   Doree Albee, MD  thyroid (ARMOUR) 30 MG tablet Take 30 mg by mouth daily before breakfast.    [provider]    Family History Family History  Problem Relation Age of Onset  . Hypertension Father   . Hyperlipidemia Father   . Diabetes Father   . Stroke Father   . Depression Father   . Anxiety disorder Father   . Hypertension Mother   . Hyperlipidemia Mother   . Diabetes Mellitus II Mother   . Congestive Heart Failure Mother   . Heart attack Mother   . Hypertension Sister   . Diabetes Sister   . Hypertension Brother   . Autoimmune disease Brother   . Diabetes Maternal Grandmother   . Lupus Paternal Grandmother   . Depression Paternal Grandmother   . Anxiety disorder Paternal Grandmother   . Depression Maternal Aunt   . Anxiety disorder Maternal Aunt   . Colon cancer Neg Hx   . Esophageal cancer Neg Hx   . Gastric cancer Neg Hx     Social History Social History   Tobacco Use  . Smoking status: Former Smoker    Types: Cigarettes    Quit date: 12/30/1982    Years since quitting: 38.1  .  Smokeless tobacco: Never Used  Vaping Use  . Vaping Use: Never used  Substance Use Topics  . Alcohol use: Not Currently    Alcohol/week: 0.0 standard drinks  . Drug use: Never     Allergies   Cephalexin   Review of Systems Review of Systems   Physical Exam Triage Vital Signs ED  Triage Vitals  Enc Vitals Group     BP 02/16/21 1313 128/82     Pulse Rate 02/16/21 1313 91     Resp 02/16/21 1313 19     Temp 02/16/21 1313 99.1 F (37.3 C)     Temp Source 02/16/21 1313 Oral     SpO2 02/16/21 1313 95 %     Weight --      Height --      Head Circumference --      Peak Flow --      Pain Score 02/16/21 1312 0     Pain Loc --      Pain Edu? --      Excl. in [place]? --    No data found.  Updated Vital Signs BP 128/82   Pulse 91   Temp 99.1 F (37.3 C) (Oral)   Resp 19   LMP  (LMP Unknown)   SpO2 95%      Physical Exam Vitals and nursing note reviewed.  Constitutional:      General: She is not in acute distress.    Appearance: She is well-developed and well-nourished. She is ill-appearing.  HENT:     Head: Normocephalic and atraumatic.     Right Ear: Tympanic membrane, ear canal and external ear normal.     Left Ear: Tympanic membrane, ear canal and external ear normal.     Nose: Congestion present.     Mouth/Throat:     Mouth: Mucous membranes are moist.     Pharynx: Oropharynx is clear. Posterior oropharyngeal erythema present.  Eyes:     Extraocular Movements: Extraocular movements intact.     Conjunctiva/sclera: Conjunctivae normal.     Pupils: Pupils are equal, round, and reactive to light.  Cardiovascular:     Rate and Rhythm: Normal rate and regular rhythm.     Heart sounds: Normal heart sounds. No murmur heard.   Pulmonary:     Effort: Pulmonary effort is normal. No respiratory distress.     Breath sounds: Normal breath sounds. No stridor. No wheezing, rhonchi or rales.     Comments: Mild cough present Chest:     Chest wall: No tenderness.   Abdominal:     Palpations: Abdomen is soft.     Tenderness: There is no abdominal tenderness.  Musculoskeletal:        General: No edema. Normal range of motion.     Cervical back: Normal range of motion and neck supple.  Skin:    General: Skin is warm and dry.     Capillary Refill: Capillary refill takes less than 2 seconds.  Neurological:     General: No focal deficit present.     Mental Status: She is alert and oriented to person, place, and time.  Psychiatric:        Mood and Affect: Mood and affect and mood normal.        Behavior: Behavior normal.        Thought Content: Thought content normal.      UC Treatments / Results  Labs (all labs ordered are listed, but only abnormal results are displayed) Labs Reviewed - No data to display  EKG   Radiology No results found.  Procedures Procedures (including critical care time)  Medications Ordered in UC Medications - No data to display  Initial Impression / Assessment and Plan / UC Course  I have reviewed the triage vital signs and the nursing notes.  Pertinent labs & imaging results that  were available during my care of the patient were reviewed by me and considered in my medical decision making (see chart for details).    URI with cough and congestion  Prescribed Augmentin BID x 7 days for URI May stop azithromycin Follow up with worsening symptoms with this office or with PCP  Final Clinical Impressions(s) / UC Diagnoses   Final diagnoses:  URI with cough and congestion     Discharge Instructions     I have sent in Augmentin for you to take twice a day for 7 days.  Follow up with this office or with primary care if symptoms are persisting.  Follow up in the ER for high fever, trouble swallowing, trouble breathing, other concerning symptoms.       ED Prescriptions    Medication Sig Dispense Auth. Provider   amoxicillin-clavulanate (AUGMENTIN) 875-125 MG tablet Take 1 tablet by mouth 2 (two)  times daily for 10 days. 20 tablet Faustino Congress, NP     PDMP not reviewed this encounter.   Faustino Congress, NP 02/18/21 [phone]

## 2021-02-16 NOTE — ED Triage Notes (Addendum)
Cough, ear pain, congestion, watering eyes since Tuesday.  Pt is on last dose of z pack.  States she feels worse after being on abx.    Had neg pcr covid/flu test on Thursday

## 2021-02-20 ENCOUNTER — Other Ambulatory Visit (INDEPENDENT_AMBULATORY_CARE_PROVIDER_SITE_OTHER): Payer: Self-pay | Admitting: Internal Medicine

## 2021-02-20 ENCOUNTER — Encounter (INDEPENDENT_AMBULATORY_CARE_PROVIDER_SITE_OTHER): Payer: Self-pay | Admitting: Internal Medicine

## 2021-02-20 DIAGNOSIS — R0981 Nasal congestion: Secondary | ICD-10-CM

## 2021-02-21 ENCOUNTER — Other Ambulatory Visit (INDEPENDENT_AMBULATORY_CARE_PROVIDER_SITE_OTHER): Payer: Self-pay | Admitting: Internal Medicine

## 2021-02-21 ENCOUNTER — Encounter (INDEPENDENT_AMBULATORY_CARE_PROVIDER_SITE_OTHER): Payer: Self-pay | Admitting: Internal Medicine

## 2021-02-21 MED ORDER — BLOOD GLUCOSE MONITOR KIT: 1.0000 | PACK | Freq: Every day | 0 refills | Status: AC

## 2021-02-22 ENCOUNTER — Encounter (INDEPENDENT_AMBULATORY_CARE_PROVIDER_SITE_OTHER): Payer: Self-pay | Admitting: Internal Medicine

## 2021-02-22 ENCOUNTER — Other Ambulatory Visit (INDEPENDENT_AMBULATORY_CARE_PROVIDER_SITE_OTHER): Payer: Self-pay | Admitting: Nurse Practitioner

## 2021-02-23 ENCOUNTER — Other Ambulatory Visit: Payer: Self-pay

## 2021-02-23 ENCOUNTER — Ambulatory Visit (HOSPITAL_COMMUNITY)
Admission: RE | Admit: 2021-02-23 | Discharge: 2021-02-23 | Disposition: A | Discharge status: Home / Self Care | Payer: Medicare Other | Source: Ambulatory Visit | Attending: Nurse Practitioner | Admitting: Nurse Practitioner

## 2021-02-23 ENCOUNTER — Other Ambulatory Visit (INDEPENDENT_AMBULATORY_CARE_PROVIDER_SITE_OTHER): Payer: Self-pay | Admitting: Internal Medicine

## 2021-02-23 DIAGNOSIS — R1319 Other dysphagia: Secondary | ICD-10-CM | POA: Diagnosis not present

## 2021-02-23 DIAGNOSIS — K219 Gastro-esophageal reflux disease without esophagitis: Secondary | ICD-10-CM | POA: Diagnosis not present

## 2021-02-23 DIAGNOSIS — Z1159 Encounter for screening for other viral diseases: Secondary | ICD-10-CM | POA: Insufficient documentation

## 2021-02-23 DIAGNOSIS — K5730 Diverticulosis of large intestine without perforation or abscess without bleeding: Secondary | ICD-10-CM | POA: Diagnosis not present

## 2021-02-23 DIAGNOSIS — R1084 Generalized abdominal pain: Secondary | ICD-10-CM | POA: Insufficient documentation

## 2021-02-23 DIAGNOSIS — K760 Fatty (change of) liver, not elsewhere classified: Secondary | ICD-10-CM | POA: Diagnosis not present

## 2021-02-23 LAB — POCT I-STAT CREATININE: Creatinine, Ser: 0.9 mg/dL (ref 0.44–1.00)

## 2021-02-23 MED ORDER — IOHEXOL 300 MG/ML  SOLN
100.0000 mL | Freq: Once | INTRAMUSCULAR | Status: AC | PRN
  Administered 2021-02-23: 100 mL via INTRAVENOUS

## 2021-02-27 ENCOUNTER — Ambulatory Visit: Payer: Medicare Other | Admitting: Internal Medicine

## 2021-02-27 DIAGNOSIS — Z719 Counseling, unspecified: Secondary | ICD-10-CM | POA: Diagnosis not present

## 2021-02-28 ENCOUNTER — Ambulatory Visit (INDEPENDENT_AMBULATORY_CARE_PROVIDER_SITE_OTHER): Payer: Medicare Other | Admitting: Internal Medicine

## 2021-02-28 DIAGNOSIS — J0101 Acute recurrent maxillary sinusitis: Secondary | ICD-10-CM | POA: Diagnosis not present

## 2021-02-28 DIAGNOSIS — R6889 Other general symptoms and signs: Secondary | ICD-10-CM | POA: Diagnosis not present

## 2021-02-28 DIAGNOSIS — J310 Chronic rhinitis: Secondary | ICD-10-CM | POA: Diagnosis not present

## 2021-02-28 DIAGNOSIS — J343 Hypertrophy of nasal turbinates: Secondary | ICD-10-CM | POA: Diagnosis not present

## 2021-03-06 ENCOUNTER — Other Ambulatory Visit: Payer: Self-pay | Admitting: Nurse Practitioner

## 2021-03-06 ENCOUNTER — Other Ambulatory Visit: Payer: Self-pay | Admitting: Obstetrics & Gynecology

## 2021-03-06 ENCOUNTER — Other Ambulatory Visit: Payer: Self-pay | Admitting: Otolaryngology

## 2021-03-06 ENCOUNTER — Other Ambulatory Visit (HOSPITAL_COMMUNITY): Payer: Self-pay | Admitting: Otolaryngology

## 2021-03-06 ENCOUNTER — Telehealth (INDEPENDENT_AMBULATORY_CARE_PROVIDER_SITE_OTHER): Payer: Medicare Other | Admitting: Nurse Practitioner

## 2021-03-06 ENCOUNTER — Encounter: Payer: Self-pay | Admitting: Nurse Practitioner

## 2021-03-06 ENCOUNTER — Other Ambulatory Visit: Payer: Self-pay

## 2021-03-06 DIAGNOSIS — K219 Gastro-esophageal reflux disease without esophagitis: Secondary | ICD-10-CM

## 2021-03-06 DIAGNOSIS — R110 Nausea: Secondary | ICD-10-CM

## 2021-03-06 DIAGNOSIS — N950 Postmenopausal bleeding: Secondary | ICD-10-CM

## 2021-03-06 DIAGNOSIS — J329 Chronic sinusitis, unspecified: Secondary | ICD-10-CM

## 2021-03-06 MED ORDER — ONDANSETRON HCL 4 MG PO TABS: 4.0000 mg | ORAL_TABLET | Freq: Three times a day (TID) | ORAL | 1 refills | Status: DC | PRN

## 2021-03-06 MED ORDER — DEXLANSOPRAZOLE 60 MG PO CPDR: 60.0000 mg | DELAYED_RELEASE_CAPSULE | Freq: Every day | ORAL | 3 refills | Status: DC

## 2021-03-06 NOTE — Progress Notes (Signed)
Referring Provider: Doree Albee, MD Primary Care Physician:  Sydney Albee, MD Primary GI:  Dr. Gala Taylor  NOTE: Service was provided via telemedicine and was requested by the patient due to COVID-19 pandemic.  Patient Location: Home  Provider Location: [place] office  Reason for Phone Visit: Follow-up  Persons present on the phone encounter, with roles: Patient, myself (provider), Sydney Council, LPN (updated meds and allergies)  Total time (minutes) spent on medical discussion: 22 minutes  Due to COVID-19, visit was conducted using the virtual method noted. Visit was requested by patient.  I connected with Sydney Taylor on 03/06/21 at  2:30 PM EST by Phone Call and verified that I am speaking with the correct person using two identifiers.   I discussed the limitations, risks, security and privacy concerns of performing an evaluation and management service by telephone and the availability of in person appointments. I also discussed with the patient that there may be a patient responsible charge related to this service. The patient expressed understanding and agreed to proceed.  Chief Complaint  Patient presents with  . Gastroesophageal Reflux  . Nausea    No vomiting    HPI:   Sydney Taylor is a 55 y.o. female who presents for virtual visit regarding: Follow-up for dysphagia and GERD.  Patient was last seen in our office 01/02/2021 for the same as well as abdominal pain, diverticulosis, need for hep C screening.  Noted history of chronic several year history of GERD well managed on AcipHex.  Recent ER visit for dysphagia with a EGD scheduled at an unknown health system and not sure if it had been completed.  Updated EGD 05/12/2020 with erosive reflux esophagitis with soft peptic stricture status post dilation and recommended Protonix 40 mg twice a day and follow-up in 6 weeks.  She did call our office after the procedure indicating persistent symptoms, Dr. Gala Taylor weight and  feeling symptoms more in the realm of globus hystericus due to widely patent GI tract.  Recommended swallow eval with speech-language pathology.  Changed PPI to Nexium granules that could be mixed with liquids.  Her following 2 visits were canceled.  At her last visit noted dysphagia doing well, was unable to get Nexium granules but is taking Protonix twice daily without issue.  GERD "99% well managed."  Uses Tums rarely as needed.  Recent epigastric pain described as sore and achy but no identified triggers.  A lot of flatulence and belching.  Notes a lot of depression and anxiety which she feels is worse and has a lot to do with her symptoms.  No other overt GI complaints.  Noted previous colonoscopy 4 years prior and recommended 10-year repeat (2028).  Recommended simethicone or Beano to help with gas symptoms, continue other medications, request previous colonoscopy, hepatitis C antibody for screening, CT of the abdomen pelvis, follow-up in 2 months.  Hepatitis C antibody test was negative which accomplished for one-time, lifetime screening.  CT of the abdomen and pelvis was completed 02/24/2021 which found likely uterine fibroid and consider pelvic ultrasound if indicated, hepatomegaly and hepatic steatosis, indeterminate 2.1 cm splenic hypodensity likely benign and correlation with prior cross-sectional imaging would be of value, no other acute findings.  We did call the patient and she states that she has previously had a CT of her abdomen and we have requested records.  Today she states she doing okay overall. Her GERD has flared the past 2 days. No recent change in diet, still on  Protonix bid. Rare NSAIDs, no more over the past couple days. Has been using TUMS the past couple days which has helped the acid. Symptoms recently include esophageal burning. Some nausea but no vomiting and post-prandial indigestion. Thinks she has a BPE previously which showed reflux or regurgitation. Denies abdominal  pain, hematochezia, melena, fever, chills, unintentional weight loss. Having good bowel movements, no constipation. Has a sinus infection, tested for COVID-19 x 2 and both were negative. Denies loss of sense of taste or smell. The patient has received COVID-19 vaccination(s).   GERD only the past couple days, but the indigestion has been worse for a couple months. Is now on Levaquin and query exacerbation as possible ADE (up to 7% known per label).  Past Medical History:  Diagnosis Date  . Anemia   . Anxiety   . Arrhythmia   . Bipolar depression ([place]) 12/28/2019  . Chronic fatigue   . Chronic headaches   . Depression   . Diabetes mellitus without complication ([place])   . Fibromyalgia   . GERD (gastroesophageal reflux disease)   . High cholesterol   . HLD (hyperlipidemia)   . Hypertension   . Insect bite   . Migraine   . Prediabetes   . Sleep apnea   . Urinary incontinence     Past Surgical History:  Procedure Laterality Date  . ESOPHAGOGASTRODUODENOSCOPY N/A 05/12/2020   Procedure: ESOPHAGOGASTRODUODENOSCOPY (EGD);  Surgeon: Daneil Dolin, MD;  [facility];  Service: Endoscopy;  Laterality: N/A;  11:15am  . Joint fusion on foot Right   . MALONEY DILATION N/A 05/12/2020   Procedure: Venia Minks DILATION;  Surgeon: Daneil Dolin, MD;  [facility];  Service: Endoscopy;  Laterality: N/A;  . NASAL SEPTUM SURGERY      Current Outpatient Medications  Medication Sig Dispense Refill  . ALPRAZolam (XANAX) 0.5 MG tablet Take 0.5 mg by mouth daily as needed.    . blood glucose meter kit and supplies KIT Inject 1 each into the skin daily. Check blood glucose once a day. Diagnosis code: E 11.9. 1 each 0  . buPROPion (WELLBUTRIN SR) 100 MG 12 hr tablet Take 100 mg by mouth at bedtime.    . chlorthalidone (HYGROTON) 25 MG tablet Take 1 tablet (25 mg total) by mouth daily. 90 tablet 0  . Cholecalciferol (VITAMIN D3) 125 MCG (5000 UT) TABS Take 2 tablets (10,000 Units  total) by mouth daily. 180 tablet 1  . Cyanocobalamin (VITAMIN B12) 1000 MCG TBCR daily.    . DULoxetine (CYMBALTA) 30 MG capsule Take 30 mg by mouth daily.    Marland Kitchen estradiol (ESTRACE) 1 MG tablet TAKE 1 TABLET BY MOUTH EVERY DAY 90 tablet 1  . fluticasone (FLONASE) 50 MCG/ACT nasal spray Place 2 sprays into both nostrils daily. 16 g 6  . glipiZIDE (GLUCOTROL) 10 MG tablet TAKE 1 TABLET BY MOUTH TWICE A DAY BEFORE A MEAL 180 tablet 0  . levofloxacin (LEVAQUIN) 500 MG tablet Take 500 mg by mouth daily.    Marland Kitchen losartan (COZAAR) 25 MG tablet TAKE 1 TABLET BY MOUTH EVERY DAY 90 tablet 0  . metoprolol tartrate (LOPRESSOR) 50 MG tablet TAKE 1 TABLET BY MOUTH TWICE A DAY 180 tablet 0  . NP THYROID 60 MG tablet Take 1 tablet (60 mg total) by mouth daily before breakfast. 30 tablet 3  . pantoprazole (PROTONIX) 40 MG tablet Take 1 tablet (40 mg total) by mouth 2 (two) times daily. 180 tablet 1  . potassium  chloride SA (KLOR-CON) 20 MEQ tablet Take by mouth. Take 2 tablets in the morning and 1 tablet in the evening.    . pregabalin (LYRICA) 75 MG capsule Take 75 mg by mouth daily.    . progesterone (PROMETRIUM) 200 MG capsule Take 2 capsules (400 mg total) by mouth daily. 60 capsule 3   No current facility-administered medications for this visit.    Allergies as of 03/06/2021 - Review Complete 03/06/2021  Allergen Reaction Noted  . Cephalexin Itching, Swelling, and Other (See Comments) 05/02/2020    Family History  Problem Relation Age of Onset  . Hypertension Father   . Hyperlipidemia Father   . Diabetes Father   . Stroke Father   . Depression Father   . Anxiety disorder Father   . Hypertension Mother   . Hyperlipidemia Mother   . Diabetes Mellitus II Mother   . Congestive Heart Failure Mother   . Heart attack Mother   . Hypertension Sister   . Diabetes Sister   . Hypertension Brother   . Autoimmune disease Brother   . Diabetes Maternal Grandmother   . Lupus Paternal Grandmother   .  Depression Paternal Grandmother   . Anxiety disorder Paternal Grandmother   . Depression Maternal Aunt   . Anxiety disorder Maternal Aunt   . Colon cancer Neg Hx   . Esophageal cancer Neg Hx   . Gastric cancer Neg Hx     Social History   Socioeconomic History  . Marital status: Married    Spouse name: Not on file  . Number of children: 0  . Years of education: Not on file  . Highest education level: Bachelor's degree (e.g., BA, AB, BS)  Occupational History  . Occupation: research associate/drug development  Tobacco Use  . Smoking status: Former Smoker    Types: Cigarettes    Quit date: 12/30/1982    Years since quitting: 38.2  . Smokeless tobacco: Never Used  Vaping Use  . Vaping Use: Never used  Substance and Sexual Activity  . Alcohol use: Not Currently    Alcohol/week: 0.0 standard drinks  . Drug use: Never  . Sexual activity: Not Currently  Other Topics Concern  . Not on file  Social History Narrative   Married for 3 years.Lives with husband.Bachelors in Education officer, museum.   Right handed   Caffeine: 1-2 per day    Social Determinants of Health   Financial Resource Strain: Not on file  Food Insecurity: Not on file  Transportation Needs: Not on file  Physical Activity: Not on file  Stress: Not on file  Social Connections: Not on file    Review of Systems: Review of Systems  Constitutional: Negative for chills, fever, malaise/fatigue and weight loss.  HENT: Negative for congestion and sore throat.   Respiratory: Negative for cough and shortness of breath.   Cardiovascular: Negative for chest pain and palpitations.  Gastrointestinal: Positive for heartburn and nausea. Negative for abdominal pain, blood in stool, constipation, diarrhea, melena and vomiting.  Musculoskeletal: Negative for joint pain and myalgias.  Skin: Negative for rash.  Neurological: Negative for dizziness and weakness.  Endo/Heme/Allergies: Does not bruise/bleed easily.  Psychiatric/Behavioral:  Negative for depression. The patient is not nervous/anxious.   All other systems reviewed and are negative.   Physical Exam: Note: limited exam due to virtual visit LMP  (LMP Unknown)  Physical Exam Nursing note reviewed.  Constitutional:      General: She is not in acute distress.    Appearance: Normal appearance.  She is well-developed. She is not ill-appearing, toxic-appearing or diaphoretic.  HENT:     Head: Normocephalic and atraumatic.     Nose: No congestion or rhinorrhea.  Eyes:     General: No scleral icterus. Pulmonary:     Effort: Pulmonary effort is normal. No respiratory distress.  Abdominal:     General: There is no distension.     Palpations: There is no hepatomegaly or splenomegaly.  Skin:    Coloration: Skin is not jaundiced.     Findings: No rash.  Neurological:     General: No focal deficit present.     Mental Status: She is alert and oriented to person, place, and time.  Psychiatric:        Attention and Perception: Attention normal.        Mood and Affect: Mood normal.        Speech: Speech normal.        Behavior: Behavior normal.        Thought Content: Thought content normal.        Cognition and Memory: Cognition and memory normal.       Assessment: Chronic several year history of GERD previously on AcipHex, Nexium, and now Protonix. Previous dysphagia with last EGD 04/2020 with dilation and erosive esophagitis. Recurrent symptoms felt to be more consistent with globus hystericus due to widely patent GI tract.  GERD: States has had a flare of GERD recently for the past 2 days, using TUMS which has helped. Some nausea, no vomiting. Has noted "indigestion" over the past couple months. Possible med effect due to Levaquin. May need to escalate PPI therapy.  Plan:  1. Stop Protonix 2. Start Dexilant 60 mg daily 3. Call for any worsening symptoms 4. Zofran 4 mg every 6 hours prn 5. Follow-up 3 months     Thank you for allowing [place] to participate  in the care of [place], DNP, AGNP-C Adult & Gerontological Nurse Practitioner [facility]

## 2021-03-07 ENCOUNTER — Other Ambulatory Visit (INDEPENDENT_AMBULATORY_CARE_PROVIDER_SITE_OTHER): Payer: Self-pay | Admitting: Internal Medicine

## 2021-03-07 DIAGNOSIS — E785 Hyperlipidemia, unspecified: Secondary | ICD-10-CM

## 2021-03-07 DIAGNOSIS — E1165 Type 2 diabetes mellitus with hyperglycemia: Secondary | ICD-10-CM

## 2021-03-07 NOTE — Telephone Encounter (Signed)
Sydney Taylor, can we find out if a PA is needed? Thanks!

## 2021-03-09 ENCOUNTER — Ambulatory Visit (INDEPENDENT_AMBULATORY_CARE_PROVIDER_SITE_OTHER): Payer: Medicare Other | Admitting: Otolaryngology

## 2021-03-09 ENCOUNTER — Ambulatory Visit (HOSPITAL_COMMUNITY)
Admission: RE | Admit: 2021-03-09 | Discharge: 2021-03-09 | Disposition: A | Discharge status: Home / Self Care | Payer: Medicare Other | Source: Ambulatory Visit | Attending: Obstetrics & Gynecology | Admitting: Obstetrics & Gynecology

## 2021-03-09 ENCOUNTER — Other Ambulatory Visit: Payer: Self-pay

## 2021-03-09 DIAGNOSIS — N950 Postmenopausal bleeding: Secondary | ICD-10-CM | POA: Insufficient documentation

## 2021-03-14 ENCOUNTER — Other Ambulatory Visit: Payer: Self-pay

## 2021-03-14 ENCOUNTER — Ambulatory Visit (HOSPITAL_COMMUNITY)
Admission: RE | Admit: 2021-03-14 | Discharge: 2021-03-14 | Disposition: A | Discharge status: Home / Self Care | Payer: Medicare Other | Source: Ambulatory Visit | Attending: Otolaryngology | Admitting: Otolaryngology

## 2021-03-14 DIAGNOSIS — J329 Chronic sinusitis, unspecified: Secondary | ICD-10-CM | POA: Diagnosis not present

## 2021-03-14 DIAGNOSIS — J320 Chronic maxillary sinusitis: Secondary | ICD-10-CM | POA: Diagnosis not present

## 2021-03-14 DIAGNOSIS — J3489 Other specified disorders of nose and nasal sinuses: Secondary | ICD-10-CM | POA: Diagnosis not present

## 2021-03-14 DIAGNOSIS — J342 Deviated nasal septum: Secondary | ICD-10-CM | POA: Diagnosis not present

## 2021-03-14 DIAGNOSIS — R6889 Other general symptoms and signs: Secondary | ICD-10-CM | POA: Diagnosis not present

## 2021-03-14 DIAGNOSIS — J321 Chronic frontal sinusitis: Secondary | ICD-10-CM | POA: Diagnosis not present

## 2021-03-15 ENCOUNTER — Other Ambulatory Visit (INDEPENDENT_AMBULATORY_CARE_PROVIDER_SITE_OTHER): Payer: Self-pay | Admitting: Internal Medicine

## 2021-03-15 ENCOUNTER — Encounter (INDEPENDENT_AMBULATORY_CARE_PROVIDER_SITE_OTHER): Payer: Self-pay | Admitting: Internal Medicine

## 2021-03-15 MED ORDER — ACCU-CHEK GUIDE VI STRP: 1.0000 | ORAL_STRIP | Freq: Every day | 6 refills | Status: DC

## 2021-03-19 ENCOUNTER — Encounter (INDEPENDENT_AMBULATORY_CARE_PROVIDER_SITE_OTHER): Payer: Self-pay | Admitting: Internal Medicine

## 2021-03-19 ENCOUNTER — Ambulatory Visit: Payer: Medicare Other | Admitting: Internal Medicine

## 2021-03-19 ENCOUNTER — Other Ambulatory Visit: Payer: Self-pay

## 2021-03-19 ENCOUNTER — Ambulatory Visit (INDEPENDENT_AMBULATORY_CARE_PROVIDER_SITE_OTHER): Payer: Medicare Other | Admitting: Internal Medicine

## 2021-03-19 VITALS — BP 146/88 | HR 81 | Temp 97.3°F | Resp 20 | Ht 69.0 in | Wt 260.0 lb

## 2021-03-19 DIAGNOSIS — R6889 Other general symptoms and signs: Secondary | ICD-10-CM | POA: Diagnosis not present

## 2021-03-19 DIAGNOSIS — E1165 Type 2 diabetes mellitus with hyperglycemia: Secondary | ICD-10-CM | POA: Diagnosis not present

## 2021-03-19 DIAGNOSIS — E2839 Other primary ovarian failure: Secondary | ICD-10-CM

## 2021-03-19 DIAGNOSIS — K760 Fatty (change of) liver, not elsewhere classified: Secondary | ICD-10-CM

## 2021-03-19 DIAGNOSIS — R5381 Other malaise: Secondary | ICD-10-CM

## 2021-03-19 DIAGNOSIS — R5383 Other fatigue: Secondary | ICD-10-CM | POA: Diagnosis not present

## 2021-03-19 DIAGNOSIS — F520 Hypoactive sexual desire disorder: Secondary | ICD-10-CM

## 2021-03-19 NOTE — Progress Notes (Signed)
Metrics: Intervention Frequency ACO  Documented Smoking Status Yearly  Screened one or more times in 24 months  Cessation Counseling or  Active cessation medication Past 24 months  Past 24 months   Guideline developer: UpToDate (See UpToDate for funding source) Date Released: 2014       Wellness Office Visit  Subjective:  Patient ID: Sydney Taylor, female    DOB: Feb 19, 1966  Age: 55 y.o. MRN: [id number]  CC: This lady comes in for follow-up of diabetes, menopausal symptoms, fatigue, fatty liver disease and hypoactive sexual desire disorder. HPI  On the last visit in early February, I had sent a prescription in for testosterone cream to her compounding pharmacy but unfortunately she has not started this yet.  I am not sure exactly why but she says that there were too many things going on. Since the last time I saw her, especially after the increase of estradiol and progesterone, she apparently had some vagina bleeding.  She did have a pelvic ultrasound which showed only a 5 mm uterine strip.  However, she is apparently going to have uterine biopsy soon.  The gynecologist feels that she can continue the estradiol and progesterone for the time being. She continues on glipizide for diabetes. She continues on estradiol, progesterone. She continues on NP thyroid for symptoms of thyroid hypofunction. Past Medical History:  Diagnosis Date  . Anemia   . Anxiety   . Arrhythmia   . Bipolar depression ([place]) 12/28/2019  . Chronic fatigue   . Chronic headaches   . Depression   . Diabetes mellitus without complication ([place])   . Fibromyalgia   . GERD (gastroesophageal reflux disease)   . High cholesterol   . HLD (hyperlipidemia)   . Hypertension   . Insect bite   . Migraine   . Prediabetes   . Sleep apnea   . Urinary incontinence    Past Surgical History:  Procedure Laterality Date  . ESOPHAGOGASTRODUODENOSCOPY N/A 05/12/2020   Procedure: ESOPHAGOGASTRODUODENOSCOPY (EGD);  Surgeon:  Daneil Dolin, MD;  [facility];  Service: Endoscopy;  Laterality: N/A;  11:15am  . Joint fusion on foot Right   . MALONEY DILATION N/A 05/12/2020   Procedure: Venia Minks DILATION;  Surgeon: Daneil Dolin, MD;  [facility];  Service: Endoscopy;  Laterality: N/A;  . NASAL SEPTUM SURGERY       Family History  Problem Relation Age of Onset  . Hypertension Father   . Hyperlipidemia Father   . Diabetes Father   . Stroke Father   . Depression Father   . Anxiety disorder Father   . Hypertension Mother   . Hyperlipidemia Mother   . Diabetes Mellitus II Mother   . Congestive Heart Failure Mother   . Heart attack Mother   . Hypertension Sister   . Diabetes Sister   . Hypertension Brother   . Autoimmune disease Brother   . Diabetes Maternal Grandmother   . Lupus Paternal Grandmother   . Depression Paternal Grandmother   . Anxiety disorder Paternal Grandmother   . Depression Maternal Aunt   . Anxiety disorder Maternal Aunt   . Colon cancer Neg Hx   . Esophageal cancer Neg Hx   . Gastric cancer Neg Hx     Social History   Social History Narrative   Married for 3 years.Lives with husband.Bachelors in Education officer, [employer].   Right handed   Caffeine: 1-2 per day    Social History   Tobacco Use  . Smoking status: Former  Smoker    Types: Cigarettes    Quit date: 12/30/1982    Years since quitting: 38.2  . Smokeless tobacco: Never Used  Substance Use Topics  . Alcohol use: Not Currently    Alcohol/week: 0.0 standard drinks    Current Meds  Medication Sig  . ALPRAZolam (XANAX) 0.5 MG tablet Take 0.5 mg by mouth daily as needed.  Marland Kitchen atorvastatin (LIPITOR) 10 MG tablet TAKE 1 TABLET BY MOUTH EVERY DAY  . blood glucose meter kit and supplies KIT Inject 1 each into the skin daily. Check blood glucose once a day. Diagnosis code: E 11.9.  . budesonide (PULMICORT) 0.5 MG/2ML nebulizer solution Take by nebulization.  Marland Kitchen buPROPion (WELLBUTRIN SR) 100 MG 12 hr tablet Take 100  mg by mouth at bedtime.  . chlorthalidone (HYGROTON) 25 MG tablet Take 1 tablet (25 mg total) by mouth daily.  . Cholecalciferol (VITAMIN D3) 125 MCG (5000 UT) TABS Take 2 tablets (10,000 Units total) by mouth daily.  . Cyanocobalamin (VITAMIN B12) 1000 MCG TBCR daily.  Marland Kitchen dexlansoprazole (DEXILANT) 60 MG capsule Take 1 capsule (60 mg total) by mouth daily.  . DULoxetine (CYMBALTA) 30 MG capsule Take 30 mg by mouth daily.  Marland Kitchen estradiol (ESTRACE) 1 MG tablet TAKE 1 TABLET BY MOUTH EVERY DAY  . fluconazole (DIFLUCAN) 150 MG tablet Take 150 mg by mouth daily.  . fluticasone (FLONASE) 50 MCG/ACT nasal spray Place 2 sprays into both nostrils daily.  Marland Kitchen glipiZIDE (GLUCOTROL) 10 MG tablet TAKE 1 TABLET BY MOUTH TWICE A DAY BEFORE A MEAL  . glucose blood (ACCU-CHEK GUIDE) test strip 1 each by Other route daily at 12 noon. Use as instructed  . losartan (COZAAR) 25 MG tablet TAKE 1 TABLET BY MOUTH EVERY DAY  . metoprolol tartrate (LOPRESSOR) 50 MG tablet TAKE 1 TABLET BY MOUTH TWICE A DAY  . NP THYROID 60 MG tablet Take 1 tablet (60 mg total) by mouth daily before breakfast.  . ondansetron (ZOFRAN) 4 MG tablet Take 1 tablet (4 mg total) by mouth every 8 (eight) hours as needed for nausea or vomiting.  . pantoprazole (PROTONIX) 40 MG tablet Take 1 tablet (40 mg total) by mouth 2 (two) times daily.  . potassium chloride SA (KLOR-CON) 20 MEQ tablet Take by mouth. Take 2 tablets in the morning and 1 tablet in the evening.  . pregabalin (LYRICA) 75 MG capsule Take 75 mg by mouth daily.  . progesterone (PROMETRIUM) 200 MG capsule Take 2 capsules (400 mg total) by mouth daily.     [place] Office Visit from 12/18/2020 in [facility]  PHQ-9 Total Score 20      Objective:   Today's Vitals: BP (!) 146/88 (BP Location: Right Arm, Patient Position: Sitting, Cuff Size: Normal)   Pulse 81   Temp (!) 97.3 F (36.3 C) (Temporal)   Resp 20   Ht 5' 9"  (1.753 m)   Wt 260 lb (117.9 kg)   LMP  (LMP  Unknown)   SpO2 99%   BMI 38.40 kg/m  Vitals with BMI 03/19/2021 03/06/2021 02/16/2021  Height 5' 9"  - -  Weight 260 lbs - -  BMI 35.70 - -  Systolic 177 (No Data) 939  Diastolic 88 (No Data) 82  Pulse 81 - 91  Some encounter information is confidential and restricted. Go to Review Flowsheets activity to see all data.     Physical Exam  She remains obese.  Blood pressure today somewhat elevated.  She is rather anxious.  She feels clammy in her peripheries although she is not having any acute chest pain or dyspnea.     Assessment   1. Hypoactive sexual desire disorder   2. Type 2 diabetes mellitus with hyperglycemia, without long-term current use of insulin (HCC)   3. Primary ovarian failure   4. Malaise and fatigue   5. Fatty liver disease, nonalcoholic       Tests ordered Orders Placed This Encounter  Procedures  . Estradiol  . Progesterone  . T3, free  . TSH  . Hemoglobin A1c  . COMPLETE METABOLIC PANEL WITH GFR     Plan: 1. She will continue with glipizide for diabetes and we will check an A1c today. 2. She will continue with estradiol, progesterone and we will check levels today. 3. I recommended that she should start taking testosterone cream which has already been prescribed and she already has at home. 4. Further recommendations will depend on blood results.  I will see her in 3 months time for follow-up.   No orders of the defined types were placed in this encounter.   Doree Albee, MD

## 2021-03-20 ENCOUNTER — Encounter (INDEPENDENT_AMBULATORY_CARE_PROVIDER_SITE_OTHER): Payer: Self-pay | Admitting: Internal Medicine

## 2021-03-20 ENCOUNTER — Other Ambulatory Visit (INDEPENDENT_AMBULATORY_CARE_PROVIDER_SITE_OTHER): Payer: Self-pay | Admitting: Internal Medicine

## 2021-03-20 LAB — COMPLETE METABOLIC PANEL WITH GFR
AG Ratio: 1.6 (calc) (ref 1.0–2.5)
ALT: 16 U/L (ref 6–29)
AST: 15 U/L (ref 10–35)
Albumin: 3.9 g/dL (ref 3.6–5.1)
Alkaline phosphatase (APISO): 74 U/L (ref 37–153)
BUN: 15 mg/dL (ref 7–25)
CO2: 22 mmol/L (ref 20–32)
Calcium: 9.2 mg/dL (ref 8.6–10.4)
Chloride: 102 mmol/L (ref 98–110)
Creat: 0.85 mg/dL (ref 0.50–1.05)
GFR, Est African American: 89 mL/min/{1.73_m2} (ref >=60)
GFR, Est Non African American: 77 mL/min/{1.73_m2} (ref >=60)
Globulin: 2.5 g/dL (calc) (ref 1.9–3.7)
Glucose, Bld: 233 mg/dL — ABNORMAL HIGH (ref 65–139)
Potassium: 4.1 mmol/L (ref 3.5–5.3)
Sodium: 138 mmol/L (ref 135–146)
Total Bilirubin: 0.4 mg/dL (ref 0.2–1.2)
Total Protein: 6.4 g/dL (ref 6.1–8.1)

## 2021-03-20 LAB — TSH: TSH: 0.93 mIU/L

## 2021-03-20 LAB — HEMOGLOBIN A1C
Hgb A1c MFr Bld: 8.3 % of total Hgb — ABNORMAL HIGH (ref <5.7)
Mean Plasma Glucose: 192 mg/dL
eAG (mmol/L): 10.6 mmol/L

## 2021-03-20 LAB — PROGESTERONE: Progesterone: 17.5 ng/mL

## 2021-03-20 LAB — ESTRADIOL: Estradiol: 42 pg/mL

## 2021-03-20 LAB — T3, FREE: T3, Free: 3.3 pg/mL (ref 2.3–4.2)

## 2021-03-20 MED ORDER — NP THYROID 90 MG PO TABS: 90.0000 mg | ORAL_TABLET | Freq: Every day | ORAL | 3 refills | Status: DC

## 2021-03-20 NOTE — Patient Instructions (Signed)
Your health issues we discussed today were:   GERD (reflux/heartburn): 1. Stop Protonix 2. Start Dexilant 60 mg once a day (in the morning on an empty stomach) 3. I have sent a prescription to your pharmacy 4. Call if any worsening symptoms  Nausea: 1. I have sent a prescription to your pharmacy for Zofran 4 mg to take every 6 hours as needed for nausea 2. Call us if you have any worsening symptoms  Overall I recommend:  1. Continue your other medications 2. Call us if you have any questions or concerns 3. Follow-up in 3 months   At [facility] we value your feedback. You may receive a survey about your visit today. Please share your experience as we strive to create trusting relationships with our patients to provide genuine, compassionate, quality care.  We appreciate your understanding and patience as we review any laboratory studies, imaging, and other diagnostic tests that are ordered as we care for you. Our office policy is 5 business days for review of these results, and any emergent or urgent results are addressed in a timely manner for your best interest. If you do not hear from our office in 1 week, please contact us.   We also encourage the use of MyChart, which contains your medical information for your review as well. If you are not enrolled in this feature, an access code is on this after visit summary for your convenience. Thank you for allowing [place] to be involved in your care.  It was great to see you today!  I hope you have a great spring!!

## 2021-03-21 NOTE — Progress Notes (Signed)
Cc'ed to pcp °

## 2021-03-25 NOTE — Progress Notes (Deleted)
Office Visit Note  Patient: Sydney Taylor             Date of Birth: [birth year]           MRN: [id number]             PCP: Doree Albee, MD Referring: Doree Albee, MD Visit Date: 03/26/2021   Subjective:  No chief complaint on file.   History of Present Illness: Sydney Taylor is a 55 y.o. female here for follow up for fibromyalgia syndrome but with recent worsening of body aches and increased inflammatory markers.***    No Rheumatology ROS completed.   Previous HPI: Sydney Taylor is a 55 y.o. female here for evaluation of fatigue as well as dysphoria, arthralgias, and history of fibromyalgia syndrome. She has had fibromyalgia symptoms with chronic fatigue and generalized pain coming and going for decades but is now feeling a lot of new aches during the past year. She was hospitalized for psychiatric illness early last year but feels those problems to be fairly well controlled now without current SI or worse than usual depression. However since around September of last year she feels generalized body pains and achy or flulike fatigue and pain. She does not recall any preceding event or medical or social situation change correlating with this change. She has episodic hot and cold sensation or flashes without objective fevers. She has pain worst over the shoulders, elbows, and knees not usually associated with much swelling. She has perviously taken gabapentin but had too much drowsiness with effective doses to use these long temr. She does not take SNRI treatments due to existing serotonergic drug treatments for her biopolar symptoms. She has headaches occasionally, chronic fatigue, fairly frequent diarrhea, and difficulty with concentration and memory.  11/2020 ESR wnl CRP 11.7  08/2020 ANA neg RF neg   PMFS History:  Patient Active Problem List   Diagnosis Date Noted  . Nausea without vomiting 03/06/2021  . Fibromyalgia syndrome 01/12/2021  . Elevated  C-reactive protein (CRP) 01/12/2021  . Need for hepatitis C screening test 01/02/2021  . Abdominal pain 01/02/2021  . Diverticulosis of colon without hemorrhage 01/02/2021  . Unsteady gait when walking 12/13/2020  . Major depressive disorder, single episode, severe ([place]) 07/14/2020  . Major depression 07/12/2020  . Dysphagia 05/11/2020  . GERD (gastroesophageal reflux disease) 05/11/2020  . Type 2 diabetes mellitus ([place]) 03/27/2020  . Bipolar depression ([place]) 12/28/2019  . Sleep apnea 12/28/2019  . Acute lower UTI 10/31/2019  . Hypokalemia 10/31/2019  . Tachycardia 10/31/2019  . Urinary tract infection without hematuria   . Hyperlipidemia 10/30/2015  . Obesity due to excess calories 10/30/2015    Past Medical History:  Diagnosis Date  . Anemia   . Anxiety   . Arrhythmia   . Bipolar depression ([place]) 12/28/2019  . Chronic fatigue   . Chronic headaches   . Depression   . Diabetes mellitus without complication ([place])   . Fibromyalgia   . GERD (gastroesophageal reflux disease)   . High cholesterol   . HLD (hyperlipidemia)   . Hypertension   . Insect bite   . Migraine   . Prediabetes   . Sleep apnea   . Urinary incontinence     Family History  Problem Relation Age of Onset  . Hypertension Father   . Hyperlipidemia Father   . Diabetes Father   . Stroke Father   . Depression Father   . Anxiety disorder Father   . Hypertension Mother   .  Hyperlipidemia Mother   . Diabetes Mellitus II Mother   . Congestive Heart Failure Mother   . Heart attack Mother   . Hypertension Sister   . Diabetes Sister   . Hypertension Brother   . Autoimmune disease Brother   . Diabetes Maternal Grandmother   . Lupus Paternal Grandmother   . Depression Paternal Grandmother   . Anxiety disorder Paternal Grandmother   . Depression Maternal Aunt   . Anxiety disorder Maternal Aunt   . Colon cancer Neg Hx   . Esophageal cancer Neg Hx   . Gastric cancer Neg Hx    Past Surgical History:   Procedure Laterality Date  . ESOPHAGOGASTRODUODENOSCOPY N/A 05/12/2020   Procedure: ESOPHAGOGASTRODUODENOSCOPY (EGD);  Surgeon: Daneil Dolin, MD;  [facility];  Service: Endoscopy;  Laterality: N/A;  11:15am  . Joint fusion on foot Right   . MALONEY DILATION N/A 05/12/2020   Procedure: Venia Minks DILATION;  Surgeon: Daneil Dolin, MD;  [facility];  Service: Endoscopy;  Laterality: N/A;  . NASAL SEPTUM SURGERY     Social History   Social History Narrative   Married for 3 years.Lives with husband.Bachelors in Education officer, [employer].   Right handed   Caffeine: 1-2 per day    Immunization History  Administered Date(s) Administered  . Influenza,inj,Quad PF,6+ Mos 11/01/2019  . Influenza-Unspecified 10/30/2020  . Moderna Sars-Covid-2 Vaccination 05/13/2020, 06/23/2020  . Pneumococcal Polysaccharide-23 12/18/2020     Objective: Vital Signs: LMP  (LMP Unknown)    Physical Exam   Musculoskeletal Exam: ***  CDAI Exam: CDAI Score: - Patient Global: -; Provider Global: - Swollen: -; Tender: - Joint Exam 03/26/2021   No joint exam has been documented for this visit   There is currently no information documented on the homunculus. Go to the Rheumatology activity and complete the homunculus joint exam.  Investigation: No additional findings.  Imaging: US PELVIC COMPLETE WITH TRANSVAGINAL  Result Date: 03/12/2021 CLINICAL DATA:  Post menopausal bleeding EXAM: TRANSABDOMINAL AND TRANSVAGINAL ULTRASOUND OF PELVIS TECHNIQUE: Both transabdominal and transvaginal ultrasound examinations of the pelvis were performed. Transabdominal technique was performed for global imaging of the pelvis including uterus, ovaries, adnexal regions, and pelvic cul-de-sac. It was necessary to proceed with endovaginal exam following the transabdominal exam to visualize the uterus endometrium ovaries. COMPARISON:  CT 02/23/2021 FINDINGS: Uterus Measurements: 6.1 x 2.9 x 2.8 cm = volume: 25.8 mL.  Lobulated exophytic mass off the right uterine fundus measuring 2.4 x 2.4 x 2.4 cm. Nabothian cysts in the cervix Endometrium Thickness: 5 mm.  No focal abnormality visualized. Right ovary Measurements: 1.1 x 1.1 x 1.2 cm = volume: 0.8 mL. Normal appearance/no adnexal mass. Left ovary Measurements: 2.3 x 1.1 x 1.9 cm = volume: 0.3 mL. Normal appearance/no adnexal mass. Other findings No abnormal free fluid. IMPRESSION: 1. 2.4 cm lobulated exophytic mass off the uterine fundus likely a fibroid. 2. Endometrial thickness of 5 mm. In the setting of post-menopausal bleeding, endometrial sampling is indicated to exclude carcinoma. If results are benign, sonohysterogram should be considered for focal lesion work-up. (Ref: Radiological Reasoning: Algorithmic Workup of Abnormal Vaginal Bleeding with Endovaginal Sonography and Sonohysterography. AJR 2008; 407:W80-88) Electronically Signed   By: Donavan Foil M.D.   On: 03/12/2021 03:12   CT MAXILLOFACIAL WO CONTRAST  Result Date: 03/15/2021 CLINICAL DATA:  Chronic sinusitis, unspecified location. Additional history provided by technologist: Patient reports chronic sinus issues, most recent sinus infection 1 month ago. EXAM: CT MAXILLOFACIAL WITHOUT CONTRAST TECHNIQUE: Multidetector CT  images of the paranasal sinuses were obtained using the standard protocol without intravenous contrast. COMPARISON:  Brain MRI 08/05/2020. neck CT 05/02/2020. FINDINGS: Paranasal sinuses: Frontal: Normally aerated. Patent frontal sinus drainage pathways. Ethmoid: Normally aerated. Maxillary: Mild mucosal thickening within the bilateral maxillary sinuses. Sphenoid: Normally aerated. Patent sphenoethmoidal recesses. Right ostiomeatal unit: Patent. Left ostiomeatal unit: Patent. Nasal passages: Leftward deviation of the bony nasal septum. Trace mucosal thickening within the bilateral nasal passages. Pneumatization of the right middle turbinate vertical lamella. Anatomy: No pneumatization  superior to anterior ethmoid notches. Symmetric and intact olfactory grooves and fovea ethmoidalis, Keros II (4-29m). Presellar sphenoid pneumatization pattern. Other: Periapical lucency surrounding maxillary teeth bilaterally. The orbits are unremarkable. No acute intracranial abnormality is identified at the imaged levels. No significant mastoid effusion. IMPRESSION: There is mild mucosal thickening within the inferior maxillary sinuses bilaterally. The paranasal sinuses are otherwise normally aerated. Patent sinus drainage pathways. Mild leftward deviation of the bony nasal septum. Mild mucosal thickening within the bilateral nasal passages. Electronically Signed   By: KKellie SimmeringDO   On: 03/15/2021 10:53    Recent Labs: Lab Results  Component Value Date   WBC 9.7 09/01/2020   HGB 14.8 09/01/2020   PLT 279 09/01/2020   NA 138 03/19/2021   K 4.1 03/19/2021   CL 102 03/19/2021   CO2 22 03/19/2021   GLUCOSE 233 (H) 03/19/2021   BUN 15 03/19/2021   CREATININE 0.85 03/19/2021   BILITOT 0.4 03/19/2021   ALKPHOS 50 07/12/2020   AST 15 03/19/2021   ALT 16 03/19/2021   PROT 6.4 03/19/2021   ALBUMIN 3.3 (L) 07/12/2020   CALCIUM 9.2 03/19/2021   GFRAA 89 03/19/2021    Speciality Comments: No specialty comments available.  Procedures:  No procedures performed Allergies: Cephalexin   Assessment / Plan:     Visit Diagnoses: No diagnosis found.  ***  Orders: No orders of the defined types were placed in this encounter.  No orders of the defined types were placed in this encounter.    Follow-Up Instructions: No follow-ups on file.   CCollier Salina MD  Note - This record has been created using [employer]  Chart creation errors have been sought, but may not always  have been located. Such creation errors do not reflect on  the standard of medical care.

## 2021-03-26 ENCOUNTER — Ambulatory Visit: Payer: Medicare Other | Admitting: Internal Medicine

## 2021-03-26 NOTE — Progress Notes (Signed)
Please call the patient and let her know what I wrote on my chart regarding her blood work.  Thanks.

## 2021-03-27 NOTE — Telephone Encounter (Signed)
Medication doesn't require a PA. Medication is $100.00. The pharmacy is going to talk with pt to see if pt wants to get the medication.

## 2021-03-27 NOTE — Telephone Encounter (Signed)
Lm to discuss further with pt.

## 2021-03-28 DIAGNOSIS — R6889 Other general symptoms and signs: Secondary | ICD-10-CM | POA: Diagnosis not present

## 2021-04-03 DIAGNOSIS — G4733 Obstructive sleep apnea (adult) (pediatric): Secondary | ICD-10-CM | POA: Diagnosis not present

## 2021-04-06 ENCOUNTER — Encounter: Payer: Self-pay | Admitting: Allergy & Immunology

## 2021-04-06 ENCOUNTER — Ambulatory Visit: Payer: Medicare Other | Admitting: Allergy & Immunology

## 2021-04-06 ENCOUNTER — Other Ambulatory Visit: Payer: Self-pay | Admitting: Allergy & Immunology

## 2021-04-06 ENCOUNTER — Other Ambulatory Visit: Payer: Self-pay

## 2021-04-06 VITALS — BP 120/78 | HR 79 | Temp 98.4°F | Resp 17 | Ht 69.0 in | Wt 266.2 lb

## 2021-04-06 DIAGNOSIS — L509 Urticaria, unspecified: Secondary | ICD-10-CM | POA: Insufficient documentation

## 2021-04-06 DIAGNOSIS — L239 Allergic contact dermatitis, unspecified cause: Secondary | ICD-10-CM | POA: Insufficient documentation

## 2021-04-06 DIAGNOSIS — W57XXXA Bitten or stung by nonvenomous insect and other nonvenomous arthropods, initial encounter: Secondary | ICD-10-CM

## 2021-04-06 DIAGNOSIS — J3089 Other allergic rhinitis: Secondary | ICD-10-CM | POA: Diagnosis not present

## 2021-04-06 DIAGNOSIS — L505 Cholinergic urticaria: Secondary | ICD-10-CM | POA: Insufficient documentation

## 2021-04-06 MED ORDER — AZELASTINE HCL 0.1 % NA SOLN: 1.0000 | Freq: Two times a day (BID) | NASAL | 12 refills | Status: DC

## 2021-04-06 MED ORDER — FLUTICASONE PROPIONATE 50 MCG/ACT NA SUSP: 1.0000 | Freq: Two times a day (BID) | NASAL | 5 refills | Status: DC

## 2021-04-06 NOTE — Progress Notes (Signed)
NEW PATIENT  Date of Service/Encounter:  04/06/21  Consult requested by: Doree Albee, MD   Assessment:   Perennial allergic rhinitis (indoor mold)  Allergic contact dermatitis  Insect bite - chiggers ?  Cholinergic urticaria  Plan/Recommendations:   1. Perennial allergic rhinitis - Testing today was positive only to mold mix 4, which is the perennial mold mix. - Thankfully, it seems that her cat is safe. - To avoid having any interactions with your other medications, let us focus on managing the symptoms directly in the nose. - Start Flonase (fluticasone) 1 spray per nostril twice daily. - Start Astelin (azelastine) 1 spray per nostril twice daily. - Use nasal saline rinses prior to using the nose sprays. - This should help with your nasal symptoms. - Copy of testing provided.  2. Allergic contact dermatitis - Make an appointment for patch testing so that we can pinpoint which metal you are reacting to. - We can also test for chemicals and fragrances if you are interested (see full list below). - These patches are placed on a Monday and read on a Wednesday and Friday.  3. Insect bite - chiggers? - Rash looks like chiggers to me. - Let us know if it gets worse we can send something in.  4. Return in about 17 days (around 04/23/2021) for [facility] TESTING.   This note in its entirety was forwarded to the Provider who requested this consultation.  Subjective:   Jannatul Wojdyla is a 55 y.o. female presenting today for evaluation of  Chief Complaint  Patient presents with  . Nasal Congestion    Masiah Lewing has a history of the following: Patient Active Problem List   Diagnosis Date Noted  . Nausea without vomiting 03/06/2021  . Fibromyalgia syndrome 01/12/2021  . Elevated C-reactive protein (CRP) 01/12/2021  . Need for hepatitis C screening test 01/02/2021  . Abdominal pain 01/02/2021  . Diverticulosis of colon without hemorrhage 01/02/2021  . Unsteady  gait when walking 12/13/2020  . Major depressive disorder, single episode, severe ([place]) 07/14/2020  . Major depression 07/12/2020  . Dysphagia 05/11/2020  . GERD (gastroesophageal reflux disease) 05/11/2020  . Type 2 diabetes mellitus ([place]) 03/27/2020  . Bipolar depression ([place]) 12/28/2019  . Sleep apnea 12/28/2019  . Acute lower UTI 10/31/2019  . Hypokalemia 10/31/2019  . Tachycardia 10/31/2019  . Urinary tract infection without hematuria   . Hyperlipidemia 10/30/2015  . Obesity due to excess calories 10/30/2015    History obtained from: chart review and patient.  Shawntia Mangal was referred by Doree Albee, MD.     Violetta is a 55 y.o. female presenting for an evaluation of possible allergic rhinitis.  She has a history of chronic fatigue and chronic pain. She has been having congestion and rhinorrhea for 6 months. She reports a constant cold. She has tried fluticasone occasionally, but not every day. She has been using the [employer] sinus rinse kit. This does provide temporary relief of her symptoms. She has not tried an antihistamine.  She has been tested around 30 years ago. This was [place]. She reports that she was positive to stuff, but she does not remember what. She has been in this area for five years.   She has a cat at home. They have had him for a couple of years. He was going to be a barn cat but is now living the life of luxury inside of the home. This is one cat only and does not go into  the bedroom. There are no dogs.   She had chickens and one pig. He is very smart, but not friendly. She has ten chickens in total. She does not sell the eggs at all.   She does have a history of hives. She tends to get them when she is cold. She does get it when she turns off the shower and gets out of the shower. They disappear fairly quickly. She does not have systemic symptoms from this. In general, they clear on their own. She does have times when they hang around for  a couple of days and she needs Benadryl. But this is rare.    She does report some irritation in her ears from certain earrings.  Tries not to wear the "cheap stuff", but even when she spends money on more expensive jewelry she still reacts.  She has never undergone any patch testing.   She is also concerned with some insect bites on her legs.  They have been there for approximately 48 hours.  She does not remember getting bit.  She has had these in the past and they resolve on their own.  They are not pruritic.  She does not want anything to treat them.  She has a history of mental health issues as well as chronic pain/fatigue. This is since October 2021. 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   Otherwise, there is no history of other atopic diseases, including asthma, food allergies, drug allergies, stinging insect allergies, eczema or contact dermatitis. There is no significant infectious history. Vaccinations are up to date.    Past Medical History: Patient Active Problem List   Diagnosis Date Noted  . Nausea without vomiting 03/06/2021  . Fibromyalgia syndrome 01/12/2021  . Elevated C-reactive protein (CRP) 01/12/2021  . Need for hepatitis C screening test 01/02/2021  . Abdominal pain 01/02/2021  . Diverticulosis of colon without hemorrhage 01/02/2021  . Unsteady gait when walking 12/13/2020  . Major depressive disorder, single episode, severe ([place]) 07/14/2020  . Major depression 07/12/2020  . Dysphagia 05/11/2020  . GERD (gastroesophageal reflux disease) 05/11/2020  . Type 2 diabetes mellitus ([place]) 03/27/2020  . Bipolar depression ([place]) 12/28/2019  . Sleep apnea 12/28/2019  . Acute lower UTI 10/31/2019  . Hypokalemia 10/31/2019  . Tachycardia 10/31/2019  . Urinary tract infection without hematuria   . Hyperlipidemia 10/30/2015  . Obesity due to excess calories 10/30/2015    Medication List:  Allergies  as of 04/06/2021      Reactions   Cephalexin Itching, Swelling, Other (See Comments)      Medication List       Accurate as of April 06, 2021 12:35 PM. If you have any questions, ask your nurse or doctor.        Accu-Chek Guide test strip Generic drug: glucose blood 1 each by Other route daily at 12 noon. Use as instructed   ALPRAZolam 0.5 MG tablet Commonly known as: XANAX Take 0.5 mg by mouth daily as needed.   atorvastatin 10 MG tablet Commonly known as: LIPITOR TAKE 1 TABLET BY MOUTH EVERY DAY   azelastine 0.1 % nasal spray Commonly known as: ASTELIN Place 1 spray into both nostrils 2 (two) times daily. Use in each nostril as directed Started by: Valentina Shaggy, MD   blood glucose meter kit and supplies Kit Inject 1 each into the skin daily. Check blood glucose once a day. Diagnosis code: E 11.9.  budesonide 0.5 MG/2ML nebulizer solution Commonly known as: PULMICORT Take by nebulization.   buPROPion 100 MG 12 hr tablet Commonly known as: WELLBUTRIN SR Take 100 mg by mouth at bedtime.   chlorthalidone 25 MG tablet Commonly known as: HYGROTON Take 1 tablet (25 mg total) by mouth daily.   dexlansoprazole 60 MG capsule Commonly known as: Dexilant Take 1 capsule (60 mg total) by mouth daily.   DULoxetine 30 MG capsule Commonly known as: CYMBALTA Take 30 mg by mouth daily.   estradiol 1 MG tablet Commonly known as: ESTRACE TAKE 1 TABLET BY MOUTH EVERY DAY   famotidine 40 MG tablet Commonly known as: PEPCID Take 40 mg by mouth daily.   fluconazole 150 MG tablet Commonly known as: DIFLUCAN Take 150 mg by mouth daily.   fluticasone 50 MCG/ACT nasal spray Commonly known as: FLONASE Place 1 spray into both nostrils 2 (two) times daily. What changed:   how much to take  when to take this Changed by: Valentina Shaggy, MD   glipiZIDE 10 MG tablet Commonly known as: GLUCOTROL TAKE 1 TABLET BY MOUTH TWICE A DAY BEFORE A MEAL   losartan 25 MG  tablet Commonly known as: COZAAR TAKE 1 TABLET BY MOUTH EVERY DAY   metoprolol tartrate 50 MG tablet Commonly known as: LOPRESSOR TAKE 1 TABLET BY MOUTH TWICE A DAY   NP Thyroid 90 MG tablet Generic drug: thyroid TAKE 1 TABLET BY MOUTH EVERY DAY   ondansetron 4 MG tablet Commonly known as: ZOFRAN Take 1 tablet (4 mg total) by mouth every 8 (eight) hours as needed for nausea or vomiting.   pantoprazole 40 MG tablet Commonly known as: PROTONIX Take 1 tablet (40 mg total) by mouth 2 (two) times daily.   potassium chloride SA 20 MEQ tablet Commonly known as: KLOR-CON Take by mouth. Take 2 tablets in the morning and 1 tablet in the evening.   pregabalin 75 MG capsule Commonly known as: LYRICA Take 75 mg by mouth daily.   progesterone 200 MG capsule Commonly known as: PROMETRIUM Take 2 capsules (400 mg total) by mouth daily.   Vitamin B12 1000 MCG Tbcr daily.   Vitamin D3 125 MCG (5000 UT) Tabs Take 2 tablets (10,000 Units total) by mouth daily.       Birth History: non-contributory  Developmental History: non-contributory  Past Surgical History: Past Surgical History:  Procedure Laterality Date  . ESOPHAGOGASTRODUODENOSCOPY N/A 05/12/2020   Procedure: ESOPHAGOGASTRODUODENOSCOPY (EGD);  Surgeon: Daneil Dolin, MD;  Location: [facility];  Service: Endoscopy;  Laterality: N/A;  11:15am  . Joint fusion on foot Right   . MALONEY DILATION N/A 05/12/2020   Procedure: Venia Minks DILATION;  Surgeon: Daneil Dolin, MD;  Location: [facility] SU[facility];  Service: Endoscopy;  Laterality: N/A;  . NASAL SEPTUM SURGERY    . SINOSCOPY       Family History: Family History  Problem Relation Age of Onset  . Hypertension Father   . Hyperlipidemia Father   . Diabetes Father   . Stroke Father   . Depression Father   . Anxiety disorder Father   . Hypertension Mother   . Hyperlipidemia Mother   . Diabetes Mellitus II Mother   . Congestive Heart Failure Mother   . Heart attack  Mother   . Hypertension Sister   . Diabetes Sister   . Hypertension Brother   . Autoimmune disease Brother   . Diabetes Maternal Grandmother   . Lupus Paternal Grandmother   .  Depression Paternal Grandmother   . Anxiety disorder Paternal Grandmother   . Depression Maternal Aunt   . Anxiety disorder Maternal Aunt   . Colon cancer Neg Hx   . Esophageal cancer Neg Hx   . Gastric cancer Neg Hx      Social History: Kyra lives at home with her husband.  They live in a house that is 7 years old.  There is laminate flooring throughout the home.  They have electric heating and central cooling.  There is 1 cat inside the home and 10 chickens and 1 pig outside of the home.  There are no dust mite covers on the bedding.  She is currently on disability.  She is not exposed to fumes, chemicals, or dust.  She does have a HEPA filter.  She did smoke when she was in high school for about 1 year.  She has been on disability since October 2021.  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  Her husband is a Furniture conservator/restorer.   Review of Systems  Constitutional: Negative.  Negative for chills, fever, malaise/fatigue and weight loss.  HENT: Negative.  Negative for congestion, ear discharge and ear pain.   Eyes: Negative for pain, discharge and redness.  Respiratory: Negative for cough, sputum production, shortness of breath and wheezing.   Cardiovascular: Negative.  Negative for chest pain and palpitations.  Gastrointestinal: Negative for abdominal pain, constipation, diarrhea, heartburn, nausea and vomiting.  Skin: Negative.  Negative for itching and rash.  Neurological: Negative for dizziness and headaches.  Endo/Heme/Allergies: Negative for environmental allergies. Does not bruise/bleed easily.       Objective:   Blood pressure 120/78, pulse 79, temperature 98.4 F (36.9 C), temperature source Temporal, resp. rate 17, height 5' 9" (1.753 m), weight 266 lb 3.2 oz (120.7 kg), SpO2  97 %. Body mass index is 39.31 kg/m.   Physical Exam:   Physical Exam Constitutional:      Appearance: She is well-developed.     Comments: Quiet, but she does open up during the visit  HENT:     Head: Normocephalic and atraumatic.     Right Ear: Tympanic membrane, ear canal and external ear normal. No drainage, swelling or tenderness. Tympanic membrane is not injected, scarred, erythematous, retracted or bulging.     Left Ear: Tympanic membrane, ear canal and external ear normal. No drainage, swelling or tenderness. Tympanic membrane is not injected, scarred, erythematous, retracted or bulging.     Nose: No nasal deformity, septal deviation, mucosal edema or rhinorrhea.     Right Turbinates: Enlarged and swollen.     Left Turbinates: Enlarged and swollen.     Right Sinus: No maxillary sinus tenderness or frontal sinus tenderness.     Left Sinus: No maxillary sinus tenderness or frontal sinus tenderness.     Comments: Some scant clear discharge in the bilateral nares.    Mouth/Throat:     Mouth: Mucous membranes are not pale and not dry.     Pharynx: Uvula midline.  Eyes:     General:        Right eye: No discharge.        Left eye: No discharge.     Conjunctiva/sclera: Conjunctivae normal.     Right eye: Right conjunctiva is not injected. No chemosis.    Left eye: Left conjunctiva is not injected. No chemosis.    Pupils: Pupils are equal, round, and reactive to light.  Cardiovascular:     Rate and Rhythm:  Normal rate and regular rhythm.     Heart sounds: Normal heart sounds.  Pulmonary:     Effort: Pulmonary effort is normal. No tachypnea, accessory muscle usage or respiratory distress.     Breath sounds: Normal breath sounds. No wheezing, rhonchi or rales.     Comments: Moving air well in all lung fields.  No increased work of breathing. Chest:     Chest wall: No tenderness.  Abdominal:     Tenderness: There is no abdominal tenderness. There is no guarding or rebound.   Lymphadenopathy:     Head:     Right side of head: No submandibular, tonsillar or occipital adenopathy.     Left side of head: No submandibular, tonsillar or occipital adenopathy.     Cervical: No cervical adenopathy.  Skin:    General: Skin is warm.     Capillary Refill: Capillary refill takes less than 2 seconds.     Coloration: Skin is not pale.     Findings: No abrasion, erythema, petechiae or rash. Rash is not papular, urticarial or vesicular.     Comments: No eczematous or urticarial lesions noted.  No dermatographia.  Neurological:     Mental Status: She is alert.      Diagnostic studies:   Allergy Studies:     Airborne Adult Perc - 04/06/21 0916    Time Antigen Placed 9449    Allergen Manufacturer Lavella Hammock    Location Back    Number of Test 59    Panel 1 Select    1. Control-Buffer 50% Glycerol Negative    2. Control-Histamine 1 mg/ml 2+    3. Albumin saline Negative    4. [place] Negative    5. [place] Negative    6. [place] Negative    7. [place] Blue Negative    8. Meadow Fescue Negative    9. Perennial Rye Negative    10. Sweet Vernal Negative    11. Timothy Negative    12. Cocklebur Negative    13. Burweed Marshelder Negative    14. Ragweed, short Negative    15. Ragweed, Giant Negative    16. Plantain,  English Negative    17. Lamb's Quarters Negative    18. Sheep Sorrell Negative    19. Rough Pigweed Negative    20. Marsh Elder, Rough Negative    21. Mugwort, Common Negative    22. Ash mix Negative    23. Birch mix Negative    24. Beech American Negative    25. Box, Elder Negative    26. Cedar, red Negative    27. Cottonwood, [place] Negative    28. Elm mix Negative    29. Hickory Negative    30. Maple mix Negative    31. Oak, [place] mix Negative    32. Pecan Pollen Negative    33. Pine mix Negative    34. Sycamore Eastern Negative    35. [place], Black Pollen Negative    36. Alternaria alternata Negative    37. Cladosporium Herbarum Negative     38. Aspergillus mix Negative    39. Penicillium mix Negative    40. Bipolaris sorokiniana (Helminthosporium) Negative    41. Drechslera spicifera (Curvularia) Negative    42. Mucor plumbeus Negative    43. Fusarium moniliforme Negative    44. Aureobasidium pullulans (pullulara) Negative    45. Rhizopus oryzae Negative    46. Botrytis cinera Negative    47. Epicoccum nigrum Negative    48. Phoma  betae Negative    49. Candida Albicans Negative    50. Trichophyton mentagrophytes Negative    51. Mite, D Farinae  5,000 AU/ml Negative    52. Mite, D Pteronyssinus  5,000 AU/ml Negative    53. Cat Hair 10,000 BAU/ml Negative    54.  Dog Epithelia Negative    55. Mixed Feathers Negative    56. Horse Epithelia Negative    57. Cockroach, German Negative    58. Mouse Negative    59. Tobacco Leaf Negative          Intradermal - 04/06/21 0935    Time Antigen Placed 0935    Allergen Manufacturer Lavella Hammock    Location Arm    Number of Test 15    Intradermal Select    Control Negative    [place] Negative    Johnson Negative    7 Grass Negative    Ragweed mix Negative    Weed mix Negative    Tree mix Negative    Mold 1 Negative    Mold 2 Negative    Mold 3 Negative    Mold 4 2+    Cat Negative    Dog Negative    Cockroach Negative    Mite mix Negative           Allergy testing results were read and interpreted by myself, documented by clinical staff.         Salvatore Marvel, MD Allergy and [facility]

## 2021-04-06 NOTE — Patient Instructions (Addendum)
1. Perennial allergic rhinitis - Testing today was positive only to mold mix 4, which is the perennial mold mix. - Thankfully, it seems that her cat is safe. - To avoid having any interactions with your other medications, let us focus on managing the symptoms directly in the nose. - Start Flonase (fluticasone) 1 spray per nostril twice daily. - Start Astelin (azelastine) 1 spray per nostril twice daily. - Use nasal saline rinses prior to using the nose sprays. - This should help with your nasal symptoms. - Copy of testing provided.  2. Allergic contact dermatitis - Make an appointment for patch testing so that we can pinpoint which metal you are reacting to. - We can also test for chemicals and fragrances if you are interested (see full list below). - These patches are placed on a Monday and read on a Wednesday and Friday.  3. Insect bite - chiggers? - Rash looks like chiggers to me. - Let us know if it gets worse we can send something in.  4. Return in about 17 days (around 04/23/2021) for [facility] TEST[facility].    Please inform us of any Emergency Department visits, hospitalizations, or changes in symptoms. Call us before going to the ED for breathing or allergy symptoms since we might be able to fit you in for a sick visit. Feel free to contact us anytime with any questions, problems, or concerns.  It was a pleasure to meet you today!  Websites that have reliable patient information: 1. American Academy of Asthma, Allergy, and Immunology: www.aaaai.org 2. Food Allergy Research and Education (FARE): foodallergy.org 3. Mothers of Asthmatics: http://www.asthmacommunitynetwork.org 4. American College of Allergy, Asthma, and Immunology: www.acaai.org   COVID-19 Vaccine Information can be found at: [email or web address].[email or web address].uk For questions related to vaccine distribution or appointments, please email [email or web address] or call [phone].    We realize that you might be concerned about having an allergic reaction to the COVID19 vaccines. To help with that concern, WE ARE OFFERING THE COVID19 VACCINES IN OUR OFFICE! Ask the front desk for dates!     "Like" [place] on Facebook and Instagram for our latest updates!      A healthy democracy works best when [employer] participate! Make sure you are registered to vote! If you have moved or changed any of your contact information, you will need to get this updated before voting!  In some cases, you MAY be able to register to vote online: [email or web address]    True Test looks for the following sensitivities:     Control of Mold Allergen   Mold and fungi can grow on a variety of surfaces provided certain temperature and moisture conditions exist.  Outdoor molds grow on plants, decaying vegetation and soil.  The major outdoor mold, Alternaria and Cladosporium, are found in very high numbers during hot and dry conditions.  Generally, a late Summer - Fall peak is seen for common outdoor fungal spores.  Rain will temporarily lower outdoor mold spore count, but counts rise rapidly when the rainy period ends.  The most important indoor molds are Aspergillus and Penicillium.  Dark, humid and poorly ventilated basements are ideal sites for mold growth.  The next most common sites of mold growth are the bathroom and the kitchen.   Indoor (Perennial) Mold Control   Positive indoor molds via skin testing: Fusarium, Aureobasidium (Pullulara) and Rhizopus  1. Maintain humidity below 50%. 2. Clean washable surfaces with 5% bleach solution. 3. Remove sources e.g. contaminated carpets.  1. Control-Buffer 50% Glycerol Negative   2. Control-Histamine 1 mg/ml 2+   3. Albumin saline Negative   4. [place] Negative   5. [place] Negative   6. Johnson Negative   7. [place] Blue Negative   8. Meadow Fescue Negative   9. Perennial Rye Negative   10. Sweet Vernal  Negative   11. Timothy Negative   12. Cocklebur Negative   13. Burweed Marshelder Negative   14. Ragweed, short Negative   15. Ragweed, Giant Negative   16. Plantain,  English Negative   17. Lamb's Quarters Negative   18. Sheep Sorrell Negative   19. Rough Pigweed Negative   20. Marsh Elder, Rough Negative   21. Mugwort, Common Negative   22. Ash mix Negative   23. Birch mix Negative   24. Beech American Negative   25. Box, Elder Negative   26. Cedar, red Negative   27. Cottonwood, [place] Negative   28. Elm mix Negative   29. Hickory Negative   30. Maple mix Negative   31. Oak, [place] mix Negative   32. Pecan Pollen Negative   33. Pine mix Negative   34. Sycamore Eastern Negative   35. [place], Black Pollen Negative   36. Alternaria alternata Negative   37. Cladosporium Herbarum Negative   38. Aspergillus mix Negative   39. Penicillium mix Negative   40. Bipolaris sorokiniana (Helminthosporium) Negative   41. Drechslera spicifera (Curvularia) Negative   42. Mucor plumbeus Negative   43. Fusarium moniliforme Negative   44. Aureobasidium pullulans (pullulara) Negative   45. Rhizopus oryzae Negative   46. Botrytis cinera Negative   47. Epicoccum nigrum Negative   48. Phoma betae Negative   49. Candida Albicans Negative   50. Trichophyton mentagrophytes Negative   51. Mite, D Farinae  5,000 AU/ml Negative   52. Mite, D Pteronyssinus  5,000 AU/ml Negative   53. Cat Hair 10,000 BAU/ml Negative   54.  Dog Epithelia Negative   55. Mixed Feathers Negative   56. Horse Epithelia Negative   57. Cockroach, German Negative   58. Mouse Negative   59. Tobacco Leaf Negative      Control Negative   [place] Negative   Johnson Negative   7 Grass Negative   Ragweed mix Negative   Weed mix Negative   Tree mix Negative   Mold 1 Negative   Mold 2 Negative   Mold 3 Negative   Mold 4 2+   Cat Negative   Dog Negative   Cockroach Negative   Mite mix Negative

## 2021-04-10 ENCOUNTER — Other Ambulatory Visit: Payer: Self-pay

## 2021-04-10 ENCOUNTER — Ambulatory Visit: Payer: Medicare Other | Admitting: Internal Medicine

## 2021-04-10 ENCOUNTER — Encounter: Payer: Self-pay | Admitting: Internal Medicine

## 2021-04-10 VITALS — BP 136/87 | HR 78 | Ht 69.0 in | Wt 263.2 lb

## 2021-04-10 DIAGNOSIS — M797 Fibromyalgia: Secondary | ICD-10-CM

## 2021-04-10 DIAGNOSIS — Z0000 Encounter for general adult medical examination without abnormal findings: Secondary | ICD-10-CM

## 2021-04-10 DIAGNOSIS — M170 Bilateral primary osteoarthritis of knee: Secondary | ICD-10-CM | POA: Diagnosis not present

## 2021-04-10 DIAGNOSIS — R7982 Elevated C-reactive protein (CRP): Secondary | ICD-10-CM | POA: Diagnosis not present

## 2021-04-10 NOTE — Progress Notes (Addendum)
Office Visit Note  Patient: Sydney Taylor             Date of Birth: 07-29-1966           MRN: [id number]             PCP: Doree Albee, MD Referring: Doree Albee, MD Visit Date: 04/10/2021   Subjective:  Follow-up (Patient has noticed worsened muscle pain since last visit. Patient was referred to PT but was unable to schedule, patient would like to be referred. )   History of Present Illness: Sydney Taylor is a 55 y.o. female here for follow up for her chronic fatigue and fibromyalgia symptoms with elevated inflammatory markers. At last visit she did not have any evidence of active inflammatory disease but we are following up to monitor because of abnormal labs. She was referred to physical therapy for her FMS symptoms also for bilateral knee osteoarthritis but was not able to keep this original appointment. Since last visit she feels her muscle pain is overall worse. Her legs feel weak and not very stable a lot of the time. No falls reported.  11/2020 ESR wnl CRP 11.7  08/2020 ANA neg RF neg  Review of Systems  Constitutional: Positive for fatigue.  HENT: Positive for mouth dryness. Negative for mouth sores and nose dryness.   Eyes: Negative for pain, itching, visual disturbance and dryness.  Respiratory: Positive for cough. Negative for hemoptysis, shortness of breath and difficulty breathing.   Cardiovascular: Positive for swelling in legs/feet. Negative for chest pain and palpitations.  Gastrointestinal: Negative for abdominal pain, blood in stool, constipation and diarrhea.  Endocrine: Negative for increased urination.  Genitourinary: Negative for painful urination.  Musculoskeletal: Positive for arthralgias, joint pain, joint swelling, myalgias, muscle weakness, morning stiffness, muscle tenderness and myalgias.  Skin: Negative for color change, rash and redness.  Allergic/Immunologic: Positive for susceptible to infections.  Neurological: Positive for  dizziness, numbness and headaches. Negative for memory loss and weakness.  Hematological: Negative for swollen glands.  Psychiatric/Behavioral: Positive for sleep disturbance. Negative for confusion.    PMFS History:  Patient Active Problem List   Diagnosis Date Noted  . Bilateral primary osteoarthritis of knee 04/10/2021  . Perennial allergic rhinitis 04/06/2021  . Allergic contact dermatitis 04/06/2021  . Bite, insect 04/06/2021  . Cholinergic urticaria 04/06/2021  . Nausea without vomiting 03/06/2021  . Fibromyalgia syndrome 01/12/2021  . Elevated C-reactive protein (CRP) 01/12/2021  . Need for hepatitis C screening test 01/02/2021  . Abdominal pain 01/02/2021  . Diverticulosis of colon without hemorrhage 01/02/2021  . Unsteady gait when walking 12/13/2020  . Major depressive disorder, single episode, severe ([place]) 07/14/2020  . Major depression 07/12/2020  . GERD (gastroesophageal reflux disease) 05/11/2020  . Type 2 diabetes mellitus ([place]) 03/27/2020  . Bipolar depression ([place]) 12/28/2019  . Sleep apnea 12/28/2019  . Hypokalemia 10/31/2019  . Tachycardia 10/31/2019  . Hyperlipidemia 10/30/2015  . Obesity due to excess calories 10/30/2015    Past Medical History:  Diagnosis Date  . Anemia   . Anxiety   . Arrhythmia   . Bipolar depression ([place]) 12/28/2019  . Chronic fatigue   . Chronic headaches   . Depression   . Diabetes mellitus without complication ([place])   . Fibromyalgia   . GERD (gastroesophageal reflux disease)   . High cholesterol   . HLD (hyperlipidemia)   . Hypertension   . Insect bite   . Migraine   . Prediabetes   . Sleep apnea   .  Urinary incontinence     Family History  Problem Relation Age of Onset  . Hypertension Father   . Hyperlipidemia Father   . Diabetes Father   . Stroke Father   . Depression Father   . Anxiety disorder Father   . Hypertension Mother   . Hyperlipidemia Mother   . Diabetes Mellitus II Mother   . Congestive Heart  Failure Mother   . Heart attack Mother   . Hypertension Sister   . Diabetes Sister   . Hypertension Brother   . Autoimmune disease Brother   . Diabetes Maternal Grandmother   . Lupus Paternal Grandmother   . Depression Paternal Grandmother   . Anxiety disorder Paternal Grandmother   . Depression Maternal Aunt   . Anxiety disorder Maternal Aunt   . Colon cancer Neg Hx   . Esophageal cancer Neg Hx   . Gastric cancer Neg Hx    Past Surgical History:  Procedure Laterality Date  . ESOPHAGOGASTRODUODENOSCOPY N/A 05/12/2020   Procedure: ESOPHAGOGASTRODUODENOSCOPY (EGD);  Surgeon: Daneil Dolin, MD;  Location: [facility];  Service: Endoscopy;  Laterality: N/A;  11:15am  . Joint fusion on foot Right   . MALONEY DILATION N/A 05/12/2020   Procedure: Venia Minks DILATION;  Surgeon: Daneil Dolin, MD;  Location: [facility];  Service: Endoscopy;  Laterality: N/A;  . NASAL SEPTUM SURGERY    . SINOSCOPY     Social History   Social History Narrative   Married for 3 years.Lives with husband.Bachelors in Education officer, museum.   Right handed   Caffeine: 1-2 per day    Immunization History  Administered Date(s) Administered  . Influenza,inj,Quad PF,6+ Mos 11/01/2019  . Influenza-Unspecified 10/30/2020  . Moderna Sars-Covid-2 Vaccination 05/13/2020, 06/23/2020  . Pneumococcal Polysaccharide-23 12/18/2020     Objective: Vital Signs: BP 136/87 (BP Location: Left Arm, Patient Position: Sitting, Cuff Size: Normal)   Pulse 78   Ht 5' 9"  (1.753 m)   Wt 263 lb 3.2 oz (119.4 kg)   LMP  (LMP Unknown)   BMI 38.87 kg/m    Physical Exam Constitutional:      Appearance: She is obese.  HENT:     Right Ear: External ear normal.     Left Ear: External ear normal.  Skin:    General: Skin is warm and dry.     Findings: No rash.  Neurological:     Mental Status: She is alert.     Comments: Fine resting tremor  Psychiatric:     Comments: Flat affect     Musculoskeletal Exam:  Neck full ROM stiffness  in base of neck with movement and tender to pressure Wrists full ROM no tenderness or swelling Fingers full ROM no tenderness or swelling Diffuse paraspinal muscle tenderness to palpation in upper and lower back Normal hip rotation no focal tenderness or weakness in thighs Knees full ROM no tenderness or swelling Ankles full ROM no tenderness or swelling   Investigation: No additional findings.  Imaging: CT MAXILLOFACIAL WO CONTRAST  Result Date: 03/15/2021 CLINICAL DATA:  Chronic sinusitis, unspecified location. Additional history provided by technologist: Patient reports chronic sinus issues, most recent sinus infection 1 month ago. EXAM: CT MAXILLOFACIAL WITHOUT CONTRAST TECHNIQUE: Multidetector CT images of the paranasal sinuses were obtained using the standard protocol without intravenous contrast. COMPARISON:  Brain MRI 08/05/2020. neck CT 05/02/2020. FINDINGS: Paranasal sinuses: Frontal: Normally aerated. Patent frontal sinus drainage pathways. Ethmoid: Normally aerated. Maxillary: Mild mucosal thickening within the bilateral maxillary sinuses. Sphenoid: Normally aerated.  Patent sphenoethmoidal recesses. Right ostiomeatal unit: Patent. Left ostiomeatal unit: Patent. Nasal passages: Leftward deviation of the bony nasal septum. Trace mucosal thickening within the bilateral nasal passages. Pneumatization of the right middle turbinate vertical lamella. Anatomy: No pneumatization superior to anterior ethmoid notches. Symmetric and intact olfactory grooves and fovea ethmoidalis, Keros II (4-49m). Presellar sphenoid pneumatization pattern. Other: Periapical lucency surrounding maxillary teeth bilaterally. The orbits are unremarkable. No acute intracranial abnormality is identified at the imaged levels. No significant mastoid effusion. IMPRESSION: There is mild mucosal thickening within the inferior maxillary sinuses bilaterally. The paranasal sinuses are otherwise normally aerated. Patent sinus  drainage pathways. Mild leftward deviation of the bony nasal septum. Mild mucosal thickening within the bilateral nasal passages. Electronically Signed   By: KKellie SimmeringDO   On: 03/15/2021 10:53    Recent Labs: Lab Results  Component Value Date   WBC 9.7 09/01/2020   HGB 14.8 09/01/2020   PLT 279 09/01/2020   NA 138 03/19/2021   K 4.1 03/19/2021   CL 102 03/19/2021   CO2 22 03/19/2021   GLUCOSE 233 (H) 03/19/2021   BUN 15 03/19/2021   CREATININE 0.85 03/19/2021   BILITOT 0.4 03/19/2021   ALKPHOS 50 07/12/2020   AST 15 03/19/2021   ALT 16 03/19/2021   PROT 6.4 03/19/2021   ALBUMIN 3.3 (L) 07/12/2020   CALCIUM 9.2 03/19/2021   GFRAA 89 03/19/2021    Speciality Comments: No specialty comments available.  Procedures:  No procedures performed Allergies: Cephalexin   Assessment / Plan:     Visit Diagnoses: Fibromyalgia syndrome - Plan: Ambulatory referral to Physical Therapy  Increase in muscle pain and soreness in back and shoulders but also in bilateral thighs. We dicussed nonpharmacological treatment of this importance. She expresses concern about exercising safely and correct amount so will re-send referral for PT.  Elevated C-reactive protein (CRP) - Plan: C-reactive protein, Sedimentation rate  Elevated CRP mild but had increased last year without specific cause. Can be mildly elevated in significant minority of obese patients without underlying disease. If stable or less would not recommend additional workup or monitoring.  Bilateral primary osteoarthritis of knee   Bilateral knee OA may be contributing to thigh pain and weakness or stability problem. No particular laxity of ROM deficit on exam though. Referring to OT as above discussed treatment options such as NSAIDs, therapy, steroid injection, and neuropathy medications.  Healthcare maintenance  Discussed recommendation for osteoporosis screening with DEXA scan. Recommended she request ordering with PCP office  based on age and postmenopausal status and never had this before.  Orders: Orders Placed This Encounter  Procedures  . C-reactive protein  . Sedimentation rate  . Ambulatory referral to Physical Therapy   No orders of the defined types were placed in this encounter.    Follow-Up Instructions: Return if symptoms worsen or fail to improve.   CCollier Salina MD  Note - This record has been created using [employer]  Chart creation errors have been sought, but may not always  have been located. Such creation errors do not reflect on  the standard of medical care.

## 2021-04-10 NOTE — Patient Instructions (Addendum)
Bone Density Test A bone density test uses a type of X-ray to measure the amount of calcium and other minerals in a person's bones. It can measure bone density in the hip and the spine. The test is similar to having a regular X-ray. This test may also be called:  Bone densitometry.  Bone mineral density test.  Dual-energy X-ray absorptiometry (DEXA). You may have this test to:  Diagnose a condition that causes weak or thin bones (osteoporosis).  Screen you for osteoporosis.  Predict your risk for a broken bone (fracture).  Determine how well your osteoporosis treatment is working. Tell a health care provider about:  Any allergies you have.  All medicines you are taking, including vitamins, herbs, eye drops, creams, and over-the-counter medicines.  Any problems you or family members have had with anesthetic medicines.  Any blood disorders you have.  Any surgeries you have had.  Any medical conditions you have.  Whether you are pregnant or may be pregnant.  Any medical tests you have had within the past 14 days that used contrast material. What are the risks? Generally, this is a safe test. However, it does expose you to a small amount of radiation, which can slightly increase your cancer risk. What happens before the test?  Do not take any calcium supplements within the 24 hours before your test.  You will need to remove all metal jewelry, eyeglasses, removable dental appliances, and any other metal objects on your body. What happens during the test?  You will lie down on an exam table. There will be an X-ray generator below you and an imaging device above you.  Other devices, such as boxes or braces, may be used to position your body properly for the scan.  The machine will slowly scan your body. You will need to keep very still while the machine does the scan.  The images will show up on a screen in the room. Images will be examined by a specialist after your test  is finished. The procedure may vary among health care providers and hospitals.   What can I expect after the test? It is up to you to get the results of your test. Ask your health care provider, or the department that is doing the test, when your results will be ready. Summary  A bone density test is an imaging test that uses a type of X-ray to measure the amount of calcium and other minerals in your bones.  The test may be used to diagnose or screen you for a condition that causes weak or thin bones (osteoporosis), predict your risk for a broken bone (fracture), or determine how well your osteoporosis treatment is working.  Do not take any calcium supplements within 24 hours before your test.  Ask your health care provider, or the department that is doing the test, when your results will be ready. This information is not intended to replace advice given to you by your health care provider. Make sure you discuss any questions you have with your health care provider. Document Revised: 06/01/2020 Document Reviewed: 06/01/2020 Elsevier Patient Education  2021 [place].   Osteoarthritis  Osteoarthritis is a type of arthritis. It refers to joint pain or joint disease. Osteoarthritis affects tissue that covers the ends of bones in joints (cartilage). Cartilage acts as a cushion between the bones and helps them move smoothly. Osteoarthritis occurs when cartilage in the joints gets worn down. Osteoarthritis is sometimes called "wear and tear" arthritis. Osteoarthritis is  the most common form of arthritis. It often occurs in older people. It is a condition that gets worse over time. The joints most often affected by this condition are in the fingers, toes, hips, knees, and spine, including the neck and lower back. What are the causes? This condition is caused by the wearing down of cartilage that covers the ends of bones. What increases the risk? The following factors may make you more likely to  develop this condition:  Being age 40 or older.  Obesity.  Overuse of joints.  Past injury of a joint.  Past surgery on a joint.  Family history of osteoarthritis. What are the signs or symptoms? The main symptoms of this condition are pain, swelling, and stiffness in the joint. Other symptoms may include:  An enlarged joint.  More pain and further damage caused by small pieces of bone or cartilage that break off and float inside of the joint.  Small deposits of bone (osteophytes) that grow on the edges of the joint.  A grating or scraping feeling inside the joint when you move it.  Popping or creaking sounds when you move.  Difficulty walking or exercising.  An inability to grip items, twist your hand(s), or control the movements of your hands and fingers. How is this diagnosed? This condition may be diagnosed based on:  Your medical history.  A physical exam.  Your symptoms.  X-rays of the affected joint(s).  Blood tests to rule out other types of arthritis. How is this treated? There is no cure for this condition, but treatment can help control pain and improve joint function. Treatment may include a combination of therapies, such as:  Pain relief techniques, such as: ? Applying heat and cold to the joint. ? Massage. ? A form of talk therapy called cognitive behavioral therapy (CBT). This therapy helps you set goals and follow up on the changes that you make.  Medicines for pain and inflammation. The medicines can be taken by mouth or applied to the skin. They include: ? NSAIDs, such as ibuprofen. ? Prescription medicines. ? Strong anti-inflammatory medicines (corticosteroids). ? Certain nutritional supplements.  A prescribed exercise program. You may work with a physical therapist.  Assistive devices, such as a brace, wrap, splint, specialized glove, or cane.  A weight control plan.  Surgery, such as: ? An osteotomy. This is done to reposition the  bones and relieve pain or to remove loose pieces of bone and cartilage. ? Joint replacement surgery. You may need this surgery if you have advanced osteoarthritis. Follow these instructions at home: Activity  Rest your affected joints as told by your health care provider.  Exercise as told by your health care provider. He or she may recommend specific types of exercise, such as: ? Strengthening exercises. These are done to strengthen the muscles that support joints affected by arthritis. ? Aerobic activities. These are exercises, such as brisk walking or water aerobics, that increase your heart rate. ? Range-of-motion activities. These help your joints move more easily. ? Balance and agility exercises. Managing pain, stiffness, and swelling  If directed, apply heat to the affected area as often as told by your health care provider. Use the heat source that your health care provider recommends, such as a moist heat pack or a heating pad. ? If you have a removable assistive device, remove it as told by your health care provider. ? Place a towel between your skin and the heat source. If your health care provider  tells you to keep the assistive device on while you apply heat, place a towel between the assistive device and the heat source. ? Leave the heat on for 20-30 minutes. ? Remove the heat if your skin turns bright red. This is especially important if you are unable to feel pain, heat, or cold. You may have a greater risk of getting burned.  If directed, put ice on the affected area. To do this: ? If you have a removable assistive device, remove it as told by your health care provider. ? Put ice in a plastic bag. ? Place a towel between your skin and the bag. If your health care provider tells you to keep the assistive device on during icing, place a towel between the assistive device and the bag. ? Leave the ice on for 20 minutes, 2-3 times a day. ? Move your fingers or toes often to  reduce stiffness and swelling. ? Raise (elevate) the injured area above the level of your heart while you are sitting or lying down.      General instructions  Take over-the-counter and prescription medicines only as told by your health care provider.  Maintain a healthy weight. Follow instructions from your health care provider for weight control.  Do not use any products that contain nicotine or tobacco, such as cigarettes, e-cigarettes, and chewing tobacco. If you need help quitting, ask your health care provider.  Use assistive devices as told by your health care provider.  Keep all follow-up visits as told by your health care provider. This is important. Where to find more information  [employer] of Arthritis and Musculoskeletal and Skin Diseases: www.niams.SouthEx[employer].es  [employer] on Aging: http://kim-Basilio.com/  American College of Rheumatology: www.rheumatology.org Contact a health care provider if:  You have redness, swelling, or a feeling of warmth in a joint that gets worse.  You have a fever along with joint or muscle aches.  You develop a rash.  You have trouble doing your normal activities. Get help right away if:  You have pain that gets worse and is not relieved by pain medicine. Summary  Osteoarthritis is a type of arthritis that affects tissue covering the ends of bones in joints (cartilage).  This condition is caused by the wearing down of cartilage that covers the ends of bones.  The main symptom of this condition is pain, swelling, and stiffness in the joint.  There is no cure for this condition, but treatment can help control pain and improve joint function. This information is not intended to replace advice given to you by your health care provider. Make sure you discuss any questions you have with your health care provider. Document Revised: 12/13/2019 Document Reviewed: 12/13/2019 Elsevier Patient Education  2021 [place].   Myofascial Pain Syndrome and Fibromyalgia Myofascial pain syndrome and fibromyalgia are both pain disorders. This pain may be felt mainly in your muscles.  Myofascial pain syndrome: ? Always has tender points in the muscle that will cause pain when pressed (trigger points). The pain may come and go. ? Usually affects your neck, upper back, and shoulder areas. The pain often radiates into your arms and hands.  Fibromyalgia: ? Has muscle pains and tenderness that come and go. ? Is often associated with fatigue and sleep problems. ? Has trigger points. ? Tends to be long-lasting (chronic), but is not life-threatening. Fibromyalgia and myofascial pain syndrome are not the same. However, they often occur together. If you have both conditions, each can  make the other worse. Both are common and can cause enough pain and fatigue to make day-to-day activities difficult. Both can be hard to diagnose because their symptoms are common in many other conditions. What are the causes? The exact causes of these conditions are not known. What increases the risk? You are more likely to develop this condition if:  You have a family history of the condition.  You have certain triggers, such as: ? Spine disorders. ? An injury (trauma) or other physical stressors. ? Being under a lot of stress. ? Medical conditions such as osteoarthritis, rheumatoid arthritis, or lupus. What are the signs or symptoms? Fibromyalgia The main symptom of fibromyalgia is widespread pain and tenderness in your muscles. Pain is sometimes described as stabbing, shooting, or burning. You may also have:  Tingling or numbness.  Sleep problems and fatigue.  Problems with attention and concentration (fibro fog). Other symptoms may include:  Bowel and bladder problems.  Headaches.  Visual problems.  Problems with odors and noises.  Depression or mood changes.  Painful menstrual periods (dysmenorrhea).  Dry  skin or eyes. These symptoms can vary over time. Myofascial pain syndrome Symptoms of myofascial pain syndrome include:  Tight, ropy bands of muscle.  Uncomfortable sensations in muscle areas. These may include aching, cramping, burning, numbness, tingling, and weakness.  Difficulty moving certain parts of the body freely (poor range of motion). How is this diagnosed? This condition may be diagnosed by your symptoms and medical history. You will also have a physical exam. In general:  Fibromyalgia is diagnosed if you have pain, fatigue, and other symptoms for more than 3 months, and symptoms cannot be explained by another condition.  Myofascial pain syndrome is diagnosed if you have trigger points in your muscles, and those trigger points are tender and cause pain elsewhere in your body (referred pain). How is this treated? Treatment for these conditions depends on the type that you have.  For fibromyalgia: ? Pain medicines, such as NSAIDs. ? Medicines for treating depression. ? Medicines for treating seizures. ? Medicines that relax the muscles.  For myofascial pain: ? Pain medicines, such as NSAIDs. ? Cooling and stretching of muscles. ? Trigger point injections. ? Sound wave (ultrasound) treatments to stimulate muscles. Treating these conditions often requires a team of health care providers. These may include:  Your primary care provider.  Physical therapist.  Complementary health care providers, such as massage therapists or acupuncturists.  Psychiatrist for cognitive behavioral therapy.   Follow these instructions at home: Medicines  Take over-the-counter and prescription medicines only as told by your health care provider.  Do not drive or use heavy machinery while taking prescription pain medicine.  If you are taking prescription pain medicine, take actions to prevent or treat constipation. Your health care provider may recommend that you: ? Drink enough fluid  to keep your urine pale yellow. ? Eat foods that are high in fiber, such as fresh fruits and vegetables, whole grains, and beans. ? Limit foods that are high in fat and processed sugars, such as fried or sweet foods. ? Take an over-the-counter or prescription medicine for constipation. Lifestyle  Exercise as directed by your health care provider or physical therapist.  Practice relaxation techniques to control your stress. You may want to try: ? Biofeedback. ? Visual imagery. ? Hypnosis. ? Muscle relaxation. ? Yoga. ? Meditation.  Maintain a healthy lifestyle. This includes eating a healthy diet and getting enough sleep.  Do not use any products that  contain nicotine or tobacco, such as cigarettes and e-cigarettes. If you need help quitting, ask your health care provider.   General instructions  Talk to your health care provider about complementary treatments, such as acupuncture or massage.  Consider joining a support group with others who are diagnosed with this condition.  Do not do activities that stress or strain your muscles. This includes repetitive motions and heavy lifting.  Keep all follow-up visits as told by your health care provider. This is important. Where to find more information  National Fibromyalgia Association: www.fmaware.[place]: www.arthritis.org  American Chronic Pain Association: www.theacpa.org Contact a health care provider if:  You have new symptoms.  Your symptoms get worse or your pain is severe.  You have side effects from your medicines.  You have trouble sleeping.  Your condition is causing depression or anxiety. Summary  Myofascial pain syndrome and fibromyalgia are pain disorders.  Myofascial pain syndrome has tender points in the muscle that will cause pain when pressed (trigger points). Fibromyalgia also has muscle pains and tenderness that come and go, but this condition is often associated with fatigue and  sleep disturbances.  Fibromyalgia and myofascial pain syndrome are not the same but often occur together, causing pain and fatigue that make day-to-day activities difficult.  Treatment for fibromyalgia includes taking medicines to relax the muscles and medicines for pain, depression, or seizures. Treatment for myofascial pain syndrome includes taking medicines for pain, cooling and stretching of muscles, and injecting medicines into trigger points.  Follow your health care provider's instructions for taking medicines and maintaining a healthy lifestyle.   Erythrocyte Sedimentation Rate Test Why am I having this test? The erythrocyte sedimentation rate (ESR) test is used to help find illnesses related to:  Sudden (acute) or long-term (chronic) infections.  Inflammation.  The body's disease-fighting system attacking healthy cells (autoimmune diseases).  Cancer.  Tissue death. If you have symptoms that may be related to any of these illnesses, your health care provider may do an ESR test before doing more specific tests. If you have an inflammatory immune disease, such as rheumatoid arthritis, you may have this test to help monitor your therapy. What is being tested? This test measures how long it takes for your red blood cells (erythrocytes) to settle in a solution over a certain amount of time (sedimentation rate). When you have an infection or inflammation, your red blood cells clump together and settle faster. The sedimentation rate provides information about how much inflammation is present in the body. What kind of sample is taken? A blood sample is required for this test. It is usually collected by inserting a needle into a blood vessel.   How do I prepare for this test? Follow any instructions from your health care provider about changing or stopping your regular medicines. Tell a health care provider about:  Any allergies you have.  All medicines you are taking, including  vitamins, herbs, eye drops, creams, and over-the-counter medicines.  Any blood disorders you have.  Any surgeries you have had.  Any medical conditions you have, such as thyroid or kidney disease.  Whether you are pregnant or may be pregnant. How are the results reported? Your results will be reported as a value that measures sedimentation rate in millimeters per hour (mm/hr). Your health care provider will compare your results to normal ranges that were established after testing a large group of people (reference values). Reference values may vary among labs and hospitals. For this test, common  reference values, which vary by age and gender, are:  Newborn: 0-2 mm/hr.  Child, up to puberty: 0-10 mm/hr.  Female: ? Under 50 years: 0-20 mm/hr. ? 50-85 years: 0-30 mm/hr. ? Over 85 years: 0-42 mm/hr.  Female: ? Under 50 years: 0-15 mm/hr. ? 50-85 years: 0-20 mm/hr. ? Over 85 years: 0-30 mm/hr. Certain conditions or medicines may cause ESR levels to be falsely lower or higher, such as:  Pregnancy.  Obesity.  Steroids, birth control pills, and blood thinners.  Thyroid or kidney disease. What do the results mean? Results that are within reference values are considered normal, meaning that the level of inflammation in your body is healthy. High ESR levels mean that there is inflammation in your body. You will have more tests to help make a diagnosis. Inflammation may result from many different conditions or injuries. Talk with your health care provider about what your results mean. Questions to ask your health care provider Ask your health care provider, or the department that is doing the test:  When will my results be ready?  How will I get my results?  What are my treatment options?  What other tests do I need?  What are my next steps? Summary  The erythrocyte sedimentation rate (ESR) test is used to help find illnesses associated with sudden (acute) or long-term (chronic)  infections, inflammation, autoimmune diseases, cancer, or tissue death.  If you have symptoms that may be related to any of these illnesses, your health care provider may do an ESR test before doing more specific tests. If you have an inflammatory immune disease, such as rheumatoid arthritis, you may have this test to help monitor your therapy.  This test measures how long it takes for your red blood cells (erythrocytes) to settle in a solution over a certain amount of time (sedimentation rate). This provides information about how much inflammation is present in the body. This information is not intended to replace advice given to you by your health care provider. Make sure you discuss any questions you have with your health care provider. Document Revised: 08/18/2020 Document Reviewed: 08/18/2020 Elsevier Patient Education  [place].

## 2021-04-11 DIAGNOSIS — Z0000 Encounter for general adult medical examination without abnormal findings: Secondary | ICD-10-CM | POA: Insufficient documentation

## 2021-04-11 LAB — SEDIMENTATION RATE: Sed Rate: 17 mm/h (ref 0–30)

## 2021-04-11 LAB — C-REACTIVE PROTEIN: CRP: 10.4 mg/L — ABNORMAL HIGH (ref <8.0)

## 2021-04-11 NOTE — Addendum Note (Signed)
Encounter addended by: Annie Paras on: 04/11/2021 4:33 PM  Actions taken: Letter saved

## 2021-04-11 NOTE — Progress Notes (Signed)
Inflammatory markers are decreased compared to previous visit. Combined with our visit exam I am not concerned for ongoing inflammatory disease. I do still recommend she follow up with the therapy referral for non medication symptoms treatment.

## 2021-04-12 ENCOUNTER — Encounter (INDEPENDENT_AMBULATORY_CARE_PROVIDER_SITE_OTHER): Payer: Self-pay | Admitting: Internal Medicine

## 2021-04-12 DIAGNOSIS — D219 Benign neoplasm of connective and other soft tissue, unspecified: Secondary | ICD-10-CM | POA: Diagnosis not present

## 2021-04-12 DIAGNOSIS — Z01818 Encounter for other preprocedural examination: Secondary | ICD-10-CM | POA: Diagnosis not present

## 2021-04-12 DIAGNOSIS — R9389 Abnormal findings on diagnostic imaging of other specified body structures: Secondary | ICD-10-CM | POA: Diagnosis not present

## 2021-04-14 ENCOUNTER — Other Ambulatory Visit (INDEPENDENT_AMBULATORY_CARE_PROVIDER_SITE_OTHER): Payer: Self-pay | Admitting: Internal Medicine

## 2021-04-16 ENCOUNTER — Other Ambulatory Visit (INDEPENDENT_AMBULATORY_CARE_PROVIDER_SITE_OTHER): Payer: Self-pay | Admitting: Internal Medicine

## 2021-04-16 DIAGNOSIS — Z13820 Encounter for screening for osteoporosis: Secondary | ICD-10-CM

## 2021-04-17 DIAGNOSIS — R2681 Unsteadiness on feet: Secondary | ICD-10-CM | POA: Diagnosis not present

## 2021-04-17 DIAGNOSIS — M797 Fibromyalgia: Secondary | ICD-10-CM | POA: Diagnosis not present

## 2021-04-17 DIAGNOSIS — R2689 Other abnormalities of gait and mobility: Secondary | ICD-10-CM | POA: Diagnosis not present

## 2021-04-19 DIAGNOSIS — M797 Fibromyalgia: Secondary | ICD-10-CM | POA: Diagnosis not present

## 2021-04-19 DIAGNOSIS — R2681 Unsteadiness on feet: Secondary | ICD-10-CM | POA: Diagnosis not present

## 2021-04-19 DIAGNOSIS — R2689 Other abnormalities of gait and mobility: Secondary | ICD-10-CM | POA: Diagnosis not present

## 2021-04-25 DIAGNOSIS — R2689 Other abnormalities of gait and mobility: Secondary | ICD-10-CM | POA: Diagnosis not present

## 2021-04-25 DIAGNOSIS — M797 Fibromyalgia: Secondary | ICD-10-CM | POA: Diagnosis not present

## 2021-04-25 DIAGNOSIS — R2681 Unsteadiness on feet: Secondary | ICD-10-CM | POA: Diagnosis not present

## 2021-04-26 ENCOUNTER — Other Ambulatory Visit (INDEPENDENT_AMBULATORY_CARE_PROVIDER_SITE_OTHER): Payer: Self-pay | Admitting: Internal Medicine

## 2021-04-30 ENCOUNTER — Other Ambulatory Visit (HOSPITAL_COMMUNITY)
Admission: RE | Admit: 2021-04-30 | Discharge: 2021-04-30 | Disposition: A | Discharge status: Home / Self Care | Payer: Medicare Other | Source: Ambulatory Visit | Attending: Obstetrics & Gynecology | Admitting: Obstetrics & Gynecology

## 2021-04-30 ENCOUNTER — Encounter: Payer: Self-pay | Admitting: Obstetrics & Gynecology

## 2021-04-30 ENCOUNTER — Ambulatory Visit: Payer: Medicare Other | Admitting: Obstetrics & Gynecology

## 2021-04-30 ENCOUNTER — Other Ambulatory Visit: Payer: Self-pay

## 2021-04-30 VITALS — BP 127/82 | HR 78 | Ht 69.0 in | Wt 264.0 lb

## 2021-04-30 DIAGNOSIS — R9389 Abnormal findings on diagnostic imaging of other specified body structures: Secondary | ICD-10-CM | POA: Insufficient documentation

## 2021-04-30 DIAGNOSIS — N950 Postmenopausal bleeding: Secondary | ICD-10-CM | POA: Diagnosis present

## 2021-04-30 NOTE — Progress Notes (Signed)
Endometrial Biopsy Procedure Note  Pre-operative Diagnosis: Post menopausal bleeding 5 mm stripe on sonogram has been taking HRT for 6 months or so, so bleeding would no be unusual taking continuous HRT  Post-operative Diagnosis: same  Indications: postmenopausal bleeding  Procedure Details   Urine pregnancy test was not done.  The risks (including infection, bleeding, pain, and uterine perforation) and benefits of the procedure were explained to the patient and Written informed consent was obtained.  Antibiotic prophylaxis against endocarditis was not indicated.   The patient was placed in the dorsal lithotomy position.  Bimanual exam showed the uterus to be in the neutral position.  A Graves' speculum inserted in the vagina, and the cervix prepped with povidone iodine.  Endocervical curettage with a Kevorkian curette was not performed.   A sharp tenaculum was applied to the anterior lip of the cervix for stabilization.  A sterile uterine sound was used to sound the uterus to a depth of 5.5 cm.  A Pipelle endometrial aspirator was used to sample the endometrium.  Sample was sent for pathologic examination.  Condition: Stable  Complications: None  Plan:  The patient was advised to call for any fever or for prolonged or severe pain or bleeding. She was advised to use OTC analgesics as needed for mild to moderate pain. She was advised to avoid vaginal intercourse for 48 hours or until the bleeding has completely stopped.  Attending Physician Documentation: I was present for or performed the following: endometrial biopsy

## 2021-05-02 LAB — SURGICAL PATHOLOGY

## 2021-05-03 DIAGNOSIS — R2681 Unsteadiness on feet: Secondary | ICD-10-CM | POA: Diagnosis not present

## 2021-05-03 DIAGNOSIS — R2689 Other abnormalities of gait and mobility: Secondary | ICD-10-CM | POA: Diagnosis not present

## 2021-05-03 DIAGNOSIS — M797 Fibromyalgia: Secondary | ICD-10-CM | POA: Diagnosis not present

## 2021-05-07 DIAGNOSIS — R2681 Unsteadiness on feet: Secondary | ICD-10-CM | POA: Diagnosis not present

## 2021-05-07 DIAGNOSIS — R2689 Other abnormalities of gait and mobility: Secondary | ICD-10-CM | POA: Diagnosis not present

## 2021-05-07 DIAGNOSIS — M797 Fibromyalgia: Secondary | ICD-10-CM | POA: Diagnosis not present

## 2021-05-08 ENCOUNTER — Encounter: Payer: Self-pay | Admitting: *Deleted

## 2021-05-08 ENCOUNTER — Encounter: Payer: Self-pay | Admitting: Obstetrics & Gynecology

## 2021-05-08 ENCOUNTER — Telehealth (INDEPENDENT_AMBULATORY_CARE_PROVIDER_SITE_OTHER): Payer: Self-pay | Admitting: Internal Medicine

## 2021-05-08 ENCOUNTER — Telehealth (INDEPENDENT_AMBULATORY_CARE_PROVIDER_SITE_OTHER): Payer: Medicare Other | Admitting: Obstetrics & Gynecology

## 2021-05-08 DIAGNOSIS — N950 Postmenopausal bleeding: Secondary | ICD-10-CM | POA: Diagnosis not present

## 2021-05-08 DIAGNOSIS — R9389 Abnormal findings on diagnostic imaging of other specified body structures: Secondary | ICD-10-CM

## 2021-05-08 NOTE — Telephone Encounter (Signed)
Left message for patient to call back and schedule Medicare Annual Wellness Visit (AWV) either virtually or in office.   awvi 04/29/21 per [facility]   please schedule at anytime with [facility] health coach  This should be a 45 minute visit.

## 2021-05-08 NOTE — Progress Notes (Signed)
Follow up appointment for results  Chief Complaint  Patient presents with   discuss biopsy results    There were no vitals taken for this visit.  EMBx atrophic endometrial  and benign endocervical glandualrtissue noted   Pt has been on HRT 6 mohts prior to bleeding episode not really unexpected given that and ES only 5 mm on HRT  MEDS ordered this encounter: No orders of the defined types were placed in this encounter.   Orders for this encounter: No orders of the defined types were placed in this encounter.   Impression:   ICD-10-CM   1. PMB (postmenopausal bleeding) on HRT  N95.0    benign biopsy    2. Thickened endometrium  R93.89        Plan: No specific follow up is needed  Follow Up: No follow-ups on file.      All questions were answered.  Past Medical History:  Diagnosis Date   Anemia    Anxiety    Arrhythmia    Bipolar depression ([place]) 12/28/2019   Chronic fatigue    Chronic headaches    Depression    Diabetes mellitus without complication (HCC)    Fibromyalgia    GERD (gastroesophageal reflux disease)    High cholesterol    HLD (hyperlipidemia)    Hypertension    Insect bite    Migraine    Prediabetes    Sleep apnea    Urinary incontinence     Past Surgical History:  Procedure Laterality Date   ESOPHAGOGASTRODUODENOSCOPY N/A 05/12/2020   Procedure: ESOPHAGOGASTRODUODENOSCOPY (EGD);  Surgeon: Daneil Dolin, MD;  [facility];  Service: Endoscopy;  Laterality: N/A;  11:15am   Joint fusion on foot Right    MALONEY DILATION N/A 05/12/2020   Procedure: MALONEY DILATION;  Surgeon: Daneil Dolin, MD;  [facility];  Service: Endoscopy;  Laterality: N/A;   NASAL SEPTUM SURGERY     SINOSCOPY      OB History     Gravida  0   Para  0   Term  0   Preterm  0   AB  0   Living  0      SAB  0   IAB  0   Ectopic  0   Multiple  0   Live Births  0           Allergies  Allergen Reactions    Cephalexin Itching, Swelling and Other (See Comments)    Social History   Socioeconomic History   Marital status: Married    Spouse name: Not on file   Number of children: 0   Years of education: Not on file   Highest education level: Bachelor's degree (e.g., BA, AB, BS)  Occupational History   Occupation: research associate/drug development  Tobacco Use   Smoking status: Former    Types: Cigarettes    Quit date: 12/30/1982    Years since quitting: 38.6   Smokeless tobacco: Never  Vaping Use   Vaping Use: Never used  Substance and Sexual Activity   Alcohol use: Not Currently    Alcohol/week: 0.0 standard drinks   Drug use: Never   Sexual activity: Yes    Birth control/protection: Post-menopausal  Other Topics Concern   Not on file  Social History Narrative   Married for 3 years.Lives with husband.Bachelors in Education officer, museum.   Right handed   Caffeine: 1-2 per day    Social Determinants of Health   Financial  Resource Strain: Not on file  Food Insecurity: Not on file  Transportation Needs: Not on file  Physical Activity: Not on file  Stress: Not on file  Social Connections: Not on file    Family History  Problem Relation Age of Onset   Hypertension Father    Hyperlipidemia Father    Diabetes Father    Stroke Father    Depression Father    Anxiety disorder Father    Hypertension Mother    Hyperlipidemia Mother    Diabetes Mellitus II Mother    Congestive Heart Failure Mother    Heart attack Mother    Hypertension Sister    Diabetes Sister    Hypertension Brother    Autoimmune disease Brother    Diabetes Maternal Grandmother    Lupus Paternal Grandmother    Depression Paternal Grandmother    Anxiety disorder Paternal Grandmother    Depression Maternal Aunt    Anxiety disorder Maternal Aunt    Colon cancer Neg Hx    Esophageal cancer Neg Hx    Gastric cancer Neg Hx

## 2021-05-10 ENCOUNTER — Other Ambulatory Visit (INDEPENDENT_AMBULATORY_CARE_PROVIDER_SITE_OTHER): Payer: Self-pay | Admitting: Internal Medicine

## 2021-05-15 ENCOUNTER — Telehealth: Payer: Self-pay

## 2021-05-15 DIAGNOSIS — R2689 Other abnormalities of gait and mobility: Secondary | ICD-10-CM | POA: Diagnosis not present

## 2021-05-15 DIAGNOSIS — R2681 Unsteadiness on feet: Secondary | ICD-10-CM

## 2021-05-15 DIAGNOSIS — M797 Fibromyalgia: Secondary | ICD-10-CM | POA: Diagnosis not present

## 2021-05-15 NOTE — Telephone Encounter (Signed)
Patient called to let Dr. Benjamine Mola know that she is attending physical therapy at [facility] in [place].  Patient states Dr. Jaynee Eagles, neurologist also sent a referral for PT and they are working under those orders.

## 2021-05-16 NOTE — Telephone Encounter (Signed)
PT reordered

## 2021-05-22 DIAGNOSIS — M797 Fibromyalgia: Secondary | ICD-10-CM | POA: Diagnosis not present

## 2021-05-22 DIAGNOSIS — R2681 Unsteadiness on feet: Secondary | ICD-10-CM | POA: Diagnosis not present

## 2021-05-22 DIAGNOSIS — R2689 Other abnormalities of gait and mobility: Secondary | ICD-10-CM | POA: Diagnosis not present

## 2021-05-23 DIAGNOSIS — I8391 Asymptomatic varicose veins of right lower extremity: Secondary | ICD-10-CM | POA: Diagnosis not present

## 2021-05-23 DIAGNOSIS — L538 Other specified erythematous conditions: Secondary | ICD-10-CM | POA: Diagnosis not present

## 2021-05-23 DIAGNOSIS — L821 Other seborrheic keratosis: Secondary | ICD-10-CM | POA: Diagnosis not present

## 2021-05-23 DIAGNOSIS — L820 Inflamed seborrheic keratosis: Secondary | ICD-10-CM | POA: Diagnosis not present

## 2021-05-23 DIAGNOSIS — R208 Other disturbances of skin sensation: Secondary | ICD-10-CM | POA: Diagnosis not present

## 2021-05-23 DIAGNOSIS — L298 Other pruritus: Secondary | ICD-10-CM | POA: Diagnosis not present

## 2021-05-23 DIAGNOSIS — Z789 Other specified health status: Secondary | ICD-10-CM | POA: Diagnosis not present

## 2021-05-30 ENCOUNTER — Ambulatory Visit (INDEPENDENT_AMBULATORY_CARE_PROVIDER_SITE_OTHER): Payer: Medicare Other | Admitting: Nurse Practitioner

## 2021-05-30 ENCOUNTER — Telehealth (INDEPENDENT_AMBULATORY_CARE_PROVIDER_SITE_OTHER): Payer: Self-pay | Admitting: Nurse Practitioner

## 2021-05-30 ENCOUNTER — Encounter (INDEPENDENT_AMBULATORY_CARE_PROVIDER_SITE_OTHER): Payer: Self-pay | Admitting: Nurse Practitioner

## 2021-05-30 VITALS — BP 123/78 | HR 87 | Temp 96.1°F | Ht 69.0 in | Wt 261.0 lb

## 2021-05-30 DIAGNOSIS — Z0000 Encounter for general adult medical examination without abnormal findings: Secondary | ICD-10-CM | POA: Diagnosis not present

## 2021-05-30 DIAGNOSIS — R5383 Other fatigue: Secondary | ICD-10-CM | POA: Diagnosis not present

## 2021-05-30 DIAGNOSIS — B3782 Candidal enteritis: Secondary | ICD-10-CM | POA: Diagnosis not present

## 2021-05-30 DIAGNOSIS — G894 Chronic pain syndrome: Secondary | ICD-10-CM | POA: Diagnosis not present

## 2021-05-30 DIAGNOSIS — E063 Autoimmune thyroiditis: Secondary | ICD-10-CM | POA: Diagnosis not present

## 2021-05-30 DIAGNOSIS — E559 Vitamin D deficiency, unspecified: Secondary | ICD-10-CM | POA: Diagnosis not present

## 2021-05-30 DIAGNOSIS — E039 Hypothyroidism, unspecified: Secondary | ICD-10-CM | POA: Diagnosis not present

## 2021-05-30 NOTE — Patient Instructions (Signed)
  Ms. Allbaugh , Thank you for taking time to come for your Medicare Wellness Visit. I appreciate your ongoing commitment to your health goals. Please review the following plan we discussed and let me know if I can assist you in the future.   These are the goals we discussed: Goals   None     This is a list of the screening recommended for you and due dates:  Health Maintenance  Topic Date Due  . Eye exam for diabetics  Never done  . COVID-19 Vaccine (3 - Booster for Moderna series) 11/23/2020  . Flu Shot  07/30/2021  . Hemoglobin A1C  09/19/2021  . Complete foot exam   12/18/2021  . Mammogram  09/29/2022  . Pap Smear  09/30/2023  . Colon Cancer Screening  08/30/2026  . Tetanus Vaccine  12/30/2030  . Pneumococcal vaccine  Completed  . Hepatitis C Screening: USPSTF Recommendation to screen - Ages 4-79 yo.  Completed  . HIV Screening  Completed  . Zoster (Shingles) Vaccine  Completed  . HPV Vaccine  Aged Out

## 2021-05-30 NOTE — Telephone Encounter (Signed)
Will you see if you can schedule this patient for annual wellness visit with me next year?  I was trying to schedule it myself but it looks like my schedule might be closed so I was unable to do it.  Thank you.

## 2021-05-30 NOTE — Progress Notes (Signed)
Due to national recommendations of social distancing related to the [place] pandemic, an audio-only tele-health visit was felt to be the most appropriate encounter type for this patient today. I connected with  [name] on 05/30/21 utilizing audio-only technology and verified that I am speaking with the correct person using two identifiers. The patient was located at their home, and I was located at the office of [facility] during the encounter.   Subjective:   Sydney Taylor is a 55 y.o. female who presents for Medicare Annual (Subsequent) preventive examination.  Review of Systems     Cardiac Risk Factors include: diabetes mellitus;dyslipidemia;hypertension;obesity (BMI >30kg/m2)     Objective:    Today's Vitals   05/30/21 1358 05/30/21 1401  BP: 123/78   Pulse: 87   Temp: (!) 96.1 F (35.6 C)   TempSrc: Temporal   Weight: 261 lb (118.4 kg)   Height: 5' 9"  (1.753 m)   PainSc:  7    Body mass index is 38.54 kg/m.  Advanced Directives 05/30/2021 07/20/2020 07/11/2020 06/03/2020 05/12/2020 05/10/2020 05/07/2020  Does Patient Have a Medical Advance Directive? No No No Yes No No No  Type of Advance Directive - - - [place]  Does patient want to make changes to medical advance directive? - - - No - Patient declined - - -  Copy of [place] in Chart? - - - No - copy requested - - -  Would patient like information on creating a medical advance directive? No - Patient declined - - No - Patient declined - - -  Some encounter information is confidential and restricted. Go to Review Flowsheets activity to see all data.    Current Medications (verified) Outpatient Encounter Medications as of 05/30/2021  Medication Sig  . ALPRAZolam (XANAX) 0.5 MG tablet Take 0.5 mg by mouth daily as needed.  . blood glucose meter kit and supplies KIT Inject 1 each into the skin daily. Check blood glucose once a day. Diagnosis code: E 11.9.  .  buPROPion (WELLBUTRIN SR) 100 MG 12 hr tablet Take 100 mg by mouth at bedtime.  . chlorthalidone (HYGROTON) 25 MG tablet TAKE 1 TABLET (25 MG TOTAL) BY MOUTH DAILY.  Marland Kitchen Cholecalciferol (VITAMIN D3) 125 MCG (5000 UT) TABS Take 2 tablets (10,000 Units total) by mouth daily.  . Cyanocobalamin (VITAMIN B12) 1000 MCG TBCR daily.  . DULoxetine (CYMBALTA) 60 MG capsule Take 60 mg by mouth daily.  Marland Kitchen estradiol (ESTRACE) 1 MG tablet Take 1 mg by mouth daily.  Marland Kitchen glipiZIDE (GLUCOTROL) 10 MG tablet TAKE 1 TABLET BY MOUTH TWICE A DAY BEFORE A MEAL  . glucose blood (ACCU-CHEK GUIDE) test strip 1 each by Other route daily at 12 noon. Use as instructed  . losartan (COZAAR) 25 MG tablet TAKE 1 TABLET BY MOUTH EVERY DAY  . metoprolol tartrate (LOPRESSOR) 50 MG tablet TAKE 1 TABLET BY MOUTH TWICE A DAY  . NP THYROID 90 MG tablet TAKE 1 TABLET BY MOUTH EVERY DAY  . ondansetron (ZOFRAN) 4 MG tablet Take 1 tablet (4 mg total) by mouth every 8 (eight) hours as needed for nausea or vomiting.  . Pantoprazole Sodium (PROTONIX PO) Take 40 mg by mouth daily.  . potassium chloride SA (KLOR-CON) 20 MEQ tablet Take by mouth. Take 2 tablets in the morning and 1 tablet in the evening.  . pregabalin (LYRICA) 75 MG capsule Take 75 mg by mouth daily.  . progesterone (PROMETRIUM) 200  MG capsule TAKE 2 CAPSULES (400 MG TOTAL) BY MOUTH DAILY.  Marland Kitchen azelastine (ASTELIN) 0.1 % nasal spray SPRAY 1 SPRAY INTO BOTH NOSTRILS TWO TIMES A DAY (Patient not taking: Reported on 05/30/2021)  . DULoxetine (CYMBALTA) 30 MG capsule Take 60 mg by mouth daily. (Patient not taking: Reported on 05/30/2021)  . fluconazole (DIFLUCAN) 150 MG tablet Take 150 mg by mouth daily. (Patient not taking: Reported on 05/30/2021)  . fluticasone (FLONASE) 50 MCG/ACT nasal spray Place 1 spray into both nostrils 2 (two) times daily. (Patient not taking: Reported on 05/30/2021)   No facility-administered encounter medications on file as of 05/30/2021.    Allergies  (verified) Cephalexin   History: Past Medical History:  Diagnosis Date  . Anemia   . Anxiety   . Arrhythmia   . Bipolar depression ([place]) 12/28/2019  . Chronic fatigue   . Chronic headaches   . Depression   . Diabetes mellitus without complication ([place])   . Fibromyalgia   . GERD (gastroesophageal reflux disease)   . High cholesterol   . HLD (hyperlipidemia)   . Hypertension   . Insect bite   . Migraine   . Prediabetes   . Sleep apnea   . Urinary incontinence    Past Surgical History:  Procedure Laterality Date  . ESOPHAGOGASTRODUODENOSCOPY N/A 05/12/2020   Procedure: ESOPHAGOGASTRODUODENOSCOPY (EGD);  Surgeon: Daneil Dolin, MD;  [facility];  Service: Endoscopy;  Laterality: N/A;  11:15am  . Joint fusion on foot Right   . MALONEY DILATION N/A 05/12/2020   Procedure: Venia Minks DILATION;  Surgeon: Daneil Dolin, MD;  [facility];  Service: Endoscopy;  Laterality: N/A;  . NASAL SEPTUM SURGERY    . SINOSCOPY     Family History  Problem Relation Age of Onset  . Hypertension Father   . Hyperlipidemia Father   . Diabetes Father   . Stroke Father   . Depression Father   . Anxiety disorder Father   . Hypertension Mother   . Hyperlipidemia Mother   . Diabetes Mellitus II Mother   . Congestive Heart Failure Mother   . Heart attack Mother   . Hypertension Sister   . Diabetes Sister   . Hypertension Brother   . Autoimmune disease Brother   . Diabetes Maternal Grandmother   . Lupus Paternal Grandmother   . Depression Paternal Grandmother   . Anxiety disorder Paternal Grandmother   . Depression Maternal Aunt   . Anxiety disorder Maternal Aunt   . Colon cancer Neg Hx   . Esophageal cancer Neg Hx   . Gastric cancer Neg Hx    Social History   Socioeconomic History  . Marital status: Married    Spouse name: Not on file  . Number of children: 0  . Years of education: Not on file  . Highest education level: Bachelor's degree (e.g., BA, AB, BS)   Occupational History  . Occupation: research associate/drug development  Tobacco Use  . Smoking status: Former Smoker    Types: Cigarettes    Quit date: 12/30/1982    Years since quitting: 38.4  . Smokeless tobacco: Never Used  Vaping Use  . Vaping Use: Never used  Substance and Sexual Activity  . Alcohol use: Not Currently    Alcohol/week: 0.0 standard drinks  . Drug use: Never  . Sexual activity: Yes    Birth control/protection: Post-menopausal  Other Topics Concern  . Not on file  Social History Narrative   Married for 3 years.Lives  with husband.Bachelors in Education officer, museum.   Right handed   Caffeine: 1-2 per day    Social Determinants of Health   Financial Resource Strain: Not on file  Food Insecurity: Not on file  Transportation Needs: Not on file  Physical Activity: Not on file  Stress: Not on file  Social Connections: Not on file    Tobacco Counseling Counseling given: Yes   Clinical Intake:  Pre-visit preparation completed: Yes  Pain : 0-10 Pain Score: 7  Pain Type: Chronic pain Pain Location: Other (Comment) (joint, forearms, calves) Pain Descriptors / Indicators: Aching Pain Onset: More than a month ago     BMI - recorded: 38.54 Nutritional Status: BMI > 30  Obese Nutritional Risks: None Diabetes: Yes CBG done?: No Did pt. bring in CBG monitor from home?: No (did not check today)  How often do you need to have someone help you when you read instructions, pamphlets, or other written materials from your doctor or pharmacy?: 2 - Rarely What is the last grade level you completed in school?: 4 year college degree  Diabetic? Yes  Interpreter Needed?: No  Information entered by :: Jeralyn Ruths, NP-C   Activities of Daily Living In your present state of health, do you have any difficulty performing the following activities: 05/30/2021  Hearing? N  Vision? N  Difficulty concentrating or making decisions? Y  Walking or climbing stairs? Y  Dressing or  bathing? N  Doing errands, shopping? N  Preparing Food and eating ? N  Using the Toilet? N  In the past six months, have you accidently leaked urine? Y  Do you have problems with loss of bowel control? N  Managing your Medications? N  Managing your Finances? N  Housekeeping or managing your Housekeeping? N  Some recent data might be hidden    Patient Care Team: Doree Albee, MD as PCP - General (Internal Medicine) Fay Records, MD as PCP - Cardiology (Cardiology) Gala Romney Cristopher Estimable, MD as Consulting Physician (Gastroenterology)  Indicate any recent Medical Services you may have received from other than [facility] providers in the past year (date may be approximate).     Assessment:   This is a routine wellness examination for [place].  Hearing/Vision screen No exam data present  Dietary issues and exercise activities discussed: Current Exercise Habits: The patient does not participate in regular exercise at present, Exercise limited by: psychological condition(s)  Goals Addressed   None    Depression Screen PHQ 2/9 Scores 05/30/2021 12/18/2020 07/20/2020 04/06/2020 03/27/2020  PHQ - 2 Score 3 6 3  0 0  PHQ- 9 Score 17 20 21  - -  Exception Documentation - - - Medical reason Medical reason    Fall Risk Fall Risk  05/30/2021 07/20/2020 04/06/2020 03/27/2020  Falls in the past year? 0 0 0 0  Number falls in past yr: 0 - 0 0  Injury with Fall? 0 - 0 0  Risk for fall due to : - - No Fall Risks -  Follow up - - Falls evaluation completed -    FALL RISK PREVENTION PERTAINING TO THE HOME:  Any stairs in or around the home? Yes  If so, are there any without handrails? No  Home free of loose throw rugs in walkways, pet beds, electrical cords, etc? Yes  Adequate lighting in your home to reduce risk of falls? Yes   ASSISTIVE DEVICES UTILIZED TO PREVENT FALLS:  Life alert? No  Use of a cane, walker or w/c? No  Grab bars in the bathroom? No  Shower chair or bench in shower? No   Elevated toilet seat or a handicapped toilet? No   TIMED UP AND GO:  Was the test performed? No .  Length of time to ambulate 10 feet:  sec.     Cognitive Function:     6CIT Screen 05/30/2021  What time? 0 points  Count back from 20 0 points  Months in reverse 0 points  Repeat phrase 0 points    Immunizations Immunization History  Administered Date(s) Administered  . Influenza,inj,Quad PF,6+ Mos 11/01/2019  . Influenza-Unspecified 10/30/2020  . Moderna Sars-Covid-2 Vaccination 05/13/2020, 06/23/2020  . Pneumococcal Polysaccharide-23 12/18/2020    TDAP status: Up to date  Flu Vaccine status: Up to date  Pneumococcal vaccine status: Up to date  Covid-19 vaccine status: Completed vaccines  Qualifies for Shingles Vaccine? Yes   Zostavax completed No   Shingrix Completed?: Yes  Screening Tests Health Maintenance  Topic Date Due  . OPHTHALMOLOGY EXAM  Never done  . TETANUS/TDAP  Never done  . Zoster Vaccines- Shingrix (1 of 2) Never done  . COVID-19 Vaccine (3 - Booster for Moderna series) 11/23/2020  . INFLUENZA VACCINE  07/30/2021  . HEMOGLOBIN A1C  09/19/2021  . FOOT EXAM  12/18/2021  . MAMMOGRAM  09/29/2022  . PAP SMEAR-Modifier  09/30/2023  . COLONOSCOPY (Pts 45-38yr Insurance coverage will need to be confirmed)  08/30/2026  . PNEUMOCOCCAL POLYSACCHARIDE VACCINE AGE 105-64 HIGH RISK  Completed  . Hepatitis C Screening  Completed  . HIV Screening  Completed  . HPV VACCINES  Aged Out    Health Maintenance  Health Maintenance Due  Topic Date Due  . OPHTHALMOLOGY EXAM  Never done  . TETANUS/TDAP  Never done  . Zoster Vaccines- Shingrix (1 of 2) Never done  . COVID-19 Vaccine (3 - Booster for Moderna series) 11/23/2020    Colorectal cancer screening: Type of screening: Colonoscopy. Completed 2017. Repeat every 10 years  Mammogram status: Completed 09/2020. Repeat every year   Lung Cancer Screening: (Low Dose CT Chest recommended if Age 56-80years,  30 pack-year currently smoking OR have quit w/in 15years.) does not qualify.   Lung Cancer Screening Referral: N/A  Additional Screening:  Hepatitis C Screening: does not qualify; Completed 01/30/2021  Vision Screening: Recommended annual ophthalmology exams for early detection of glaucoma and other disorders of the eye. Is the patient up to date with their annual eye exam?  Yes  Who is the provider or what is the name of the office in which the patient attends annual eye exams? [facility] ([facility]) If pt is not established with a provider, would they like to be referred to a provider to establish care? No .   Dental Screening: Recommended annual dental exams for proper oral hygiene  Community Resource Referral / Chronic Care Management: CRR required this visit?  No   CCM required this visit?  No      Plan:    Patient is up-to-date with routine recommended screenings for a female of her age.  She tells me last Pap smear was completed by her OB in October 2021.  She does have concerns regarding her blood sugars at home, but is scheduled for follow-up later this month and it will be discussed further at that time.  She was encouraged to check her blood sugars on a daily basis for at least 2 weeks prior to coming into that office visit so she can  bring these to her visit for closer monitoring of diabetes.  She tells me she is agreeable to this plan. I have personally reviewed and noted the following in the patient's chart:   . Medical and social history . Use of alcohol, tobacco or illicit drugs  . Current medications and supplements including opioid prescriptions.  . Functional ability and status . Nutritional status . Physical activity . Advanced directives . List of other physicians . Hospitalizations, surgeries, and ER visits in previous 12 months . Vitals . Screenings to include cognitive, depression, and falls . Referrals and appointments  In addition, I have  reviewed and discussed with patient certain preventive protocols, quality metrics, and best practice recommendations. A written personalized care plan for preventive services as well as general preventive health recommendations were provided to patient.   Patient will follow up later on this month, or sooner as needed.  Total time spent the telephone today was 21 minutes.  Ailene Ards, NP   05/30/2021

## 2021-05-31 NOTE — Telephone Encounter (Signed)
Done

## 2021-06-02 ENCOUNTER — Other Ambulatory Visit (INDEPENDENT_AMBULATORY_CARE_PROVIDER_SITE_OTHER): Payer: Self-pay | Admitting: Nurse Practitioner

## 2021-06-02 ENCOUNTER — Encounter (INDEPENDENT_AMBULATORY_CARE_PROVIDER_SITE_OTHER): Payer: Self-pay | Admitting: Internal Medicine

## 2021-06-08 ENCOUNTER — Encounter (INDEPENDENT_AMBULATORY_CARE_PROVIDER_SITE_OTHER): Payer: Self-pay | Admitting: Internal Medicine

## 2021-06-08 ENCOUNTER — Other Ambulatory Visit (HOSPITAL_COMMUNITY): Payer: Medicare Other

## 2021-06-09 DIAGNOSIS — E119 Type 2 diabetes mellitus without complications: Secondary | ICD-10-CM | POA: Diagnosis not present

## 2021-06-09 DIAGNOSIS — Z0100 Encounter for examination of eyes and vision without abnormal findings: Secondary | ICD-10-CM | POA: Diagnosis not present

## 2021-06-11 LAB — HM DIABETES EYE EXAM

## 2021-06-12 ENCOUNTER — Other Ambulatory Visit (INDEPENDENT_AMBULATORY_CARE_PROVIDER_SITE_OTHER): Payer: Self-pay | Admitting: Internal Medicine

## 2021-06-12 ENCOUNTER — Encounter (INDEPENDENT_AMBULATORY_CARE_PROVIDER_SITE_OTHER): Payer: Self-pay | Admitting: Internal Medicine

## 2021-06-12 DIAGNOSIS — G4730 Sleep apnea, unspecified: Secondary | ICD-10-CM

## 2021-06-15 NOTE — Telephone Encounter (Signed)
Please look at this. I printed copy to be sent to scan center.

## 2021-06-25 ENCOUNTER — Ambulatory Visit (INDEPENDENT_AMBULATORY_CARE_PROVIDER_SITE_OTHER): Payer: Medicare Other | Admitting: Internal Medicine

## 2021-06-25 ENCOUNTER — Encounter (INDEPENDENT_AMBULATORY_CARE_PROVIDER_SITE_OTHER): Payer: Self-pay | Admitting: Internal Medicine

## 2021-06-25 ENCOUNTER — Other Ambulatory Visit: Payer: Self-pay

## 2021-06-25 VITALS — BP 133/80 | HR 86 | Temp 97.5°F | Resp 18 | Ht 69.0 in | Wt 268.0 lb

## 2021-06-25 DIAGNOSIS — E785 Hyperlipidemia, unspecified: Secondary | ICD-10-CM

## 2021-06-25 DIAGNOSIS — I10 Essential (primary) hypertension: Secondary | ICD-10-CM | POA: Diagnosis not present

## 2021-06-25 DIAGNOSIS — E1165 Type 2 diabetes mellitus with hyperglycemia: Secondary | ICD-10-CM

## 2021-06-25 NOTE — Progress Notes (Signed)
Been to see a holistic md in [place]. Started her on magnesium citrate daily.

## 2021-06-25 NOTE — Progress Notes (Signed)
Metrics: Intervention Frequency ACO  Documented Smoking Status Yearly  Screened one or more times in 24 months  Cessation Counseling or  Active cessation medication Past 24 months  Past 24 months   Guideline developer: UpToDate (See UpToDate for funding source) Date Released: 2014       Wellness Office Visit  Subjective:  Patient ID: Sydney Taylor, female    DOB: May 12, 1966  Age: 55 y.o. MRN: [id number]  CC: This lady comes in for follow-up of uncontrolled diabetes, obesity, hyperlipidemia and hypertension. HPI  Without my input, the patient went to see another physician and obtained a lot of blood work.  She has still elevated hemoglobin A1c of 8%, LDL particle number that is elevated, free T3 level that is suboptimal and free testosterone level that is increased. All this points to a diagnosis of insulin resistance and PCOS. She is only taking glipizide for her diabetes. She admits that she has a sugar craving/addiction.  She has found intermittent fasting difficult to do. Past Medical History:  Diagnosis Date   Anemia    Anxiety    Arrhythmia    Bipolar depression ([place]) 12/28/2019   Chronic fatigue    Chronic headaches    Depression    Diabetes mellitus without complication (HCC)    Fibromyalgia    GERD (gastroesophageal reflux disease)    High cholesterol    HLD (hyperlipidemia)    Hypertension    Insect bite    Migraine    Prediabetes    Sleep apnea    Urinary incontinence    Past Surgical History:  Procedure Laterality Date   ESOPHAGOGASTRODUODENOSCOPY N/A 05/12/2020   Procedure: ESOPHAGOGASTRODUODENOSCOPY (EGD);  Surgeon: Daneil Dolin, MD;  [facility]: [facility];  Service: Endoscopy;  Laterality: N/A;  11:15am   Joint fusion on foot Right    MALONEY DILATION N/A 05/12/2020   Procedure: MALONEY DILATION;  Surgeon: Daneil Dolin, MD;  Location: [facility];  Service: Endoscopy;  Laterality: N/A;   NASAL SEPTUM SURGERY     SINOSCOPY       Family  History  Problem Relation Age of Onset   Hypertension Father    Hyperlipidemia Father    Diabetes Father    Stroke Father    Depression Father    Anxiety disorder Father    Hypertension Mother    Hyperlipidemia Mother    Diabetes Mellitus II Mother    Congestive Heart Failure Mother    Heart attack Mother    Hypertension Sister    Diabetes Sister    Hypertension Brother    Autoimmune disease Brother    Diabetes Maternal Grandmother    Lupus Paternal Grandmother    Depression Paternal Grandmother    Anxiety disorder Paternal Grandmother    Depression Maternal Aunt    Anxiety disorder Maternal Aunt    Colon cancer Neg Hx    Esophageal cancer Neg Hx    Gastric cancer Neg Hx     Social History   Social History Narrative   Married for 3 years.Lives with husband.Bachelors in Education officer, [employer].   Right handed   Caffeine: 1-2 per day    Social History   Tobacco Use   Smoking status: Former    Pack years: 0.00    Types: Cigarettes    Quit date: 12/30/1982    Years since quitting: 38.5   Smokeless tobacco: Never  Substance Use Topics   Alcohol use: Not Currently    Alcohol/week: 0.0 standard drinks    Current  Meds  Medication Sig   ALPRAZolam (XANAX) 0.5 MG tablet Take 0.5 mg by mouth daily as needed.   azelastine (ASTELIN) 0.1 % nasal spray SPRAY 1 SPRAY INTO BOTH NOSTRILS TWO TIMES A DAY   blood glucose meter kit and supplies KIT Inject 1 each into the skin daily. Check blood glucose once a day. Diagnosis code: E 11.9.   buPROPion (WELLBUTRIN SR) 100 MG 12 hr tablet Take 100 mg by mouth at bedtime.   chlorthalidone (HYGROTON) 25 MG tablet TAKE 1 TABLET (25 MG TOTAL) BY MOUTH DAILY.   Cholecalciferol (VITAMIN D3) 125 MCG (5000 UT) TABS Take 2 tablets (10,000 Units total) by mouth daily.   Cyanocobalamin (VITAMIN B12) 1000 MCG TBCR daily.   DULoxetine (CYMBALTA) 30 MG capsule Take 60 mg by mouth daily.   DULoxetine (CYMBALTA) 60 MG capsule Take 60 mg by mouth daily.    fluconazole (DIFLUCAN) 150 MG tablet Take 150 mg by mouth daily.   fluticasone (FLONASE) 50 MCG/ACT nasal spray Place 1 spray into both nostrils 2 (two) times daily.   glipiZIDE (GLUCOTROL) 10 MG tablet TAKE 1 TABLET BY MOUTH TWICE A DAY BEFORE A MEAL   glucose blood (ACCU-CHEK GUIDE) test strip 1 each by Other route daily at 12 noon. Use as instructed   losartan (COZAAR) 25 MG tablet TAKE 1 TABLET BY MOUTH EVERY DAY   metoprolol tartrate (LOPRESSOR) 50 MG tablet TAKE 1 TABLET BY MOUTH TWICE A DAY   NP THYROID 90 MG tablet TAKE 1 TABLET BY MOUTH EVERY DAY   ondansetron (ZOFRAN) 4 MG tablet Take 1 tablet (4 mg total) by mouth every 8 (eight) hours as needed for nausea or vomiting.   Pantoprazole Sodium (PROTONIX PO) Take 40 mg by mouth daily.   potassium chloride SA (KLOR-CON) 20 MEQ tablet Take by mouth. Take 2 tablets in the morning and 1 tablet in the evening.   pregabalin (LYRICA) 75 MG capsule Take 75 mg by mouth daily.   progesterone (PROMETRIUM) 200 MG capsule TAKE 2 CAPSULES (400 MG TOTAL) BY MOUTH DAILY.   [DISCONTINUED] estradiol (ESTRACE) 1 MG tablet Take 1 mg by mouth daily.     Flowsheet Row Clinical Support from 05/30/2021 in [facility]  PHQ-9 Total Score 17       Objective:   Today's Vitals: BP 133/80 (BP Location: Left Arm, Patient Position: Sitting, Cuff Size: Normal)   Pulse 86   Temp (!) 97.5 F (36.4 C) (Temporal)   Resp 18   Ht 5' 9"  (1.753 m)   Wt 268 lb (121.6 kg)   LMP  (LMP Unknown)   SpO2 98%   BMI 39.58 kg/m  Vitals with BMI 06/25/2021 05/30/2021 04/30/2021  Height 5' 9"  5' 9"  5' 9"   Weight 268 lbs 261 lbs 264 lbs  BMI 39.56 95.28 41.32  Systolic 440 102 725  Diastolic 80 78 82  Pulse 86 87 78  Some encounter information is confidential and restricted. Go to Review Flowsheets activity to see all data.     Physical Exam She remains morbidly obese and has gained further weight.      Assessment   1. Type 2 diabetes mellitus with  hyperglycemia, without long-term current use of insulin ([place])   2. Hyperlipidemia, unspecified hyperlipidemia type   3. Essential hypertension, benign       Tests ordered Orders Placed This Encounter  Procedures   COMPLETE METABOLIC PANEL WITH GFR   Hemoglobin A1c   Lipid panel  Plan: 1.  I would rather stop glipizide which increases insulin levels and try something else for diabetes.  I have told her to go back and see what medications are covered by her insurance so we can go from there.  She did not tolerate metformin in the past. 2.  We can further optimize thyroid to help with insulin resistance so I have given her samples of NP thyroid and she will take NP thyroid 90 mg in the morning and then NP thyroid 60 mg at lunchtime, increase after a month to NP thyroid 90 mg twice a day. 3.  We talked about intermittent fasting again and I recommended that she use apple cider vinegar in her water to see if this will help crave hunger and sugar. 4.  Follow-up in about 2 months.    No orders of the defined types were placed in this encounter.   Doree Albee, MD

## 2021-06-27 ENCOUNTER — Other Ambulatory Visit (INDEPENDENT_AMBULATORY_CARE_PROVIDER_SITE_OTHER): Payer: Self-pay | Admitting: Internal Medicine

## 2021-06-27 ENCOUNTER — Encounter (INDEPENDENT_AMBULATORY_CARE_PROVIDER_SITE_OTHER): Payer: Self-pay | Admitting: Nurse Practitioner

## 2021-06-27 MED ORDER — TRULICITY 0.75 MG/0.5ML ~~LOC~~ SOAJ: 0.7500 mg | SUBCUTANEOUS | 3 refills | Status: DC

## 2021-06-27 NOTE — Progress Notes (Signed)
Trulicity

## 2021-06-28 ENCOUNTER — Other Ambulatory Visit (INDEPENDENT_AMBULATORY_CARE_PROVIDER_SITE_OTHER): Payer: Self-pay | Admitting: Internal Medicine

## 2021-06-28 DIAGNOSIS — E1100 Type 2 diabetes mellitus with hyperosmolarity without nonketotic hyperglycemic-hyperosmolar coma (NKHHC): Secondary | ICD-10-CM

## 2021-07-11 ENCOUNTER — Encounter (INDEPENDENT_AMBULATORY_CARE_PROVIDER_SITE_OTHER): Payer: Self-pay | Admitting: Internal Medicine

## 2021-07-12 IMAGING — CT CT NECK W/ CM
4 of 5 series · 16 of 36 positions shown, 18 images · IV contrast (Omnipaque or Isovue)
Comparison: CT angiogram chest 10/31/2011

CLINICAL DATA: Dysphagia. Additional history provided: Patient
reports difficulty swallowing medications for 2 weeks and difficulty
swallowing food for the past 2 days. Patient reports she took a pill
earlier today and feels as though it stuck in her chest.

EXAM:
CT NECK WITH CONTRAST
TECHNIQUE: Multidetector CT imaging of the neck was performed using the
standard protocol following the bolus administration of intravenous
contrast.
CONTRAST:  75mL OMNIPAQUE IOHEXOL 300 MG/ML  SOLN

[Series 2: axial neck · axial · 0.50mm/px · z∈[-166,-2]mm · 5 of 124 slices shown, 7 images]
[im 21/124  soft-tissue]
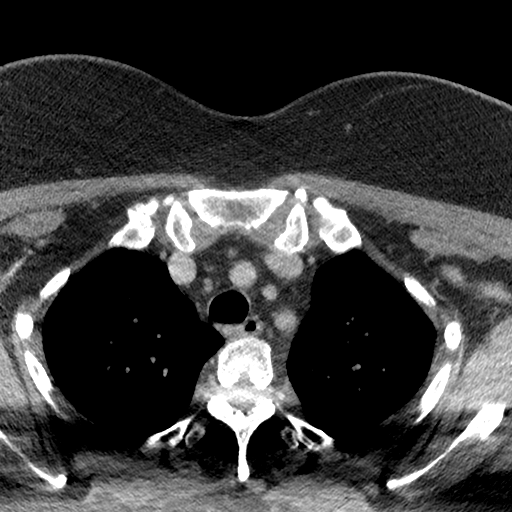
[im 21/124  bone]
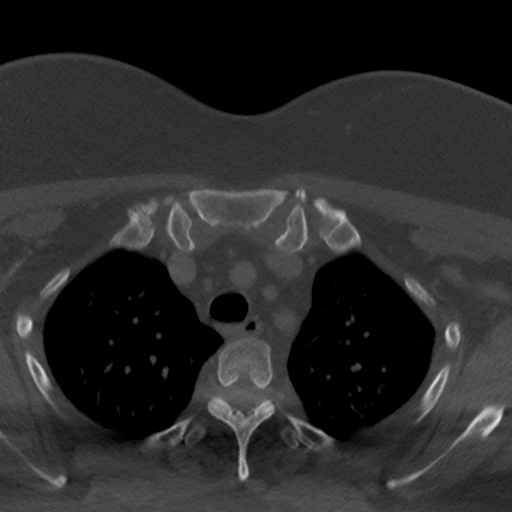
[im 42/124  bone]
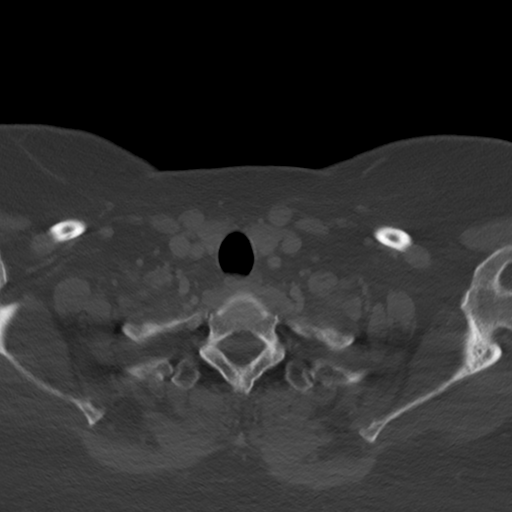
[im 62/124  bone]
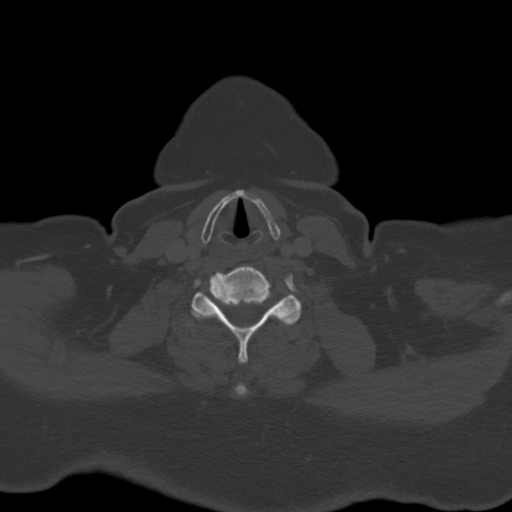
[im 83/124  bone]
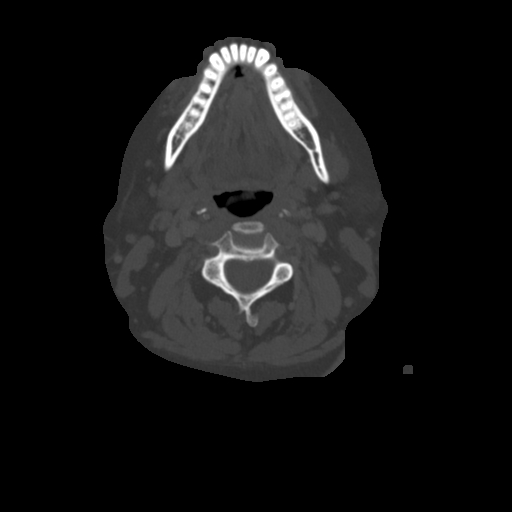
[im 103/124  soft-tissue]
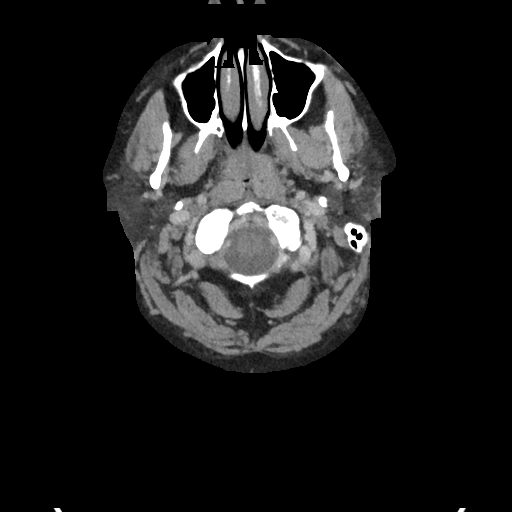
[im 103/124  bone]
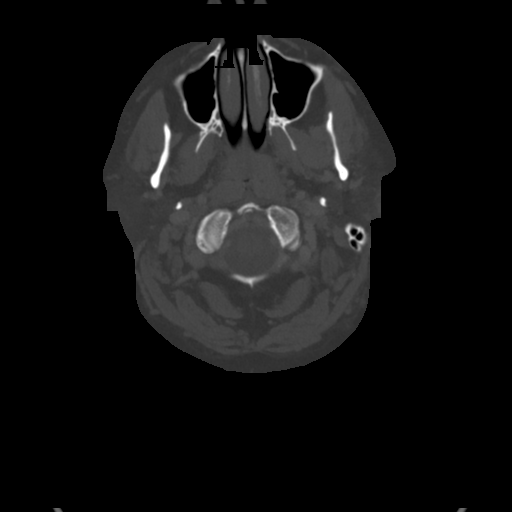

[Series 4: cor neck · coronal · 0.47mm/px · 3 of 121 slices shown]
[im 25/121  bone]
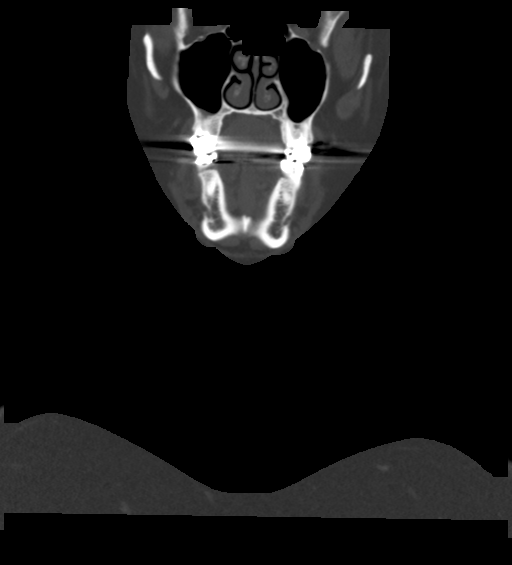
[im 49/121  bone]
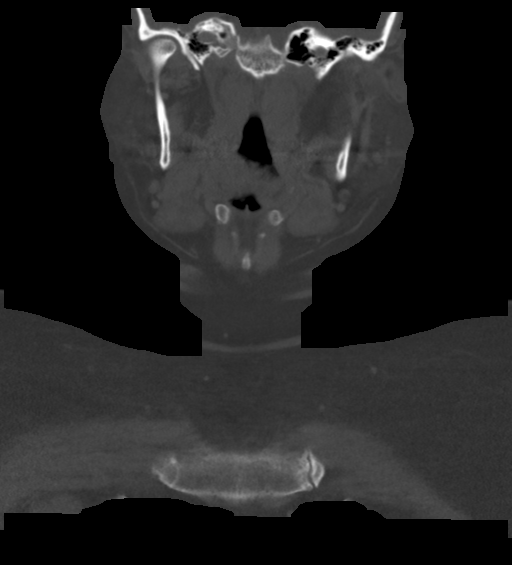
[im 73/121  bone]
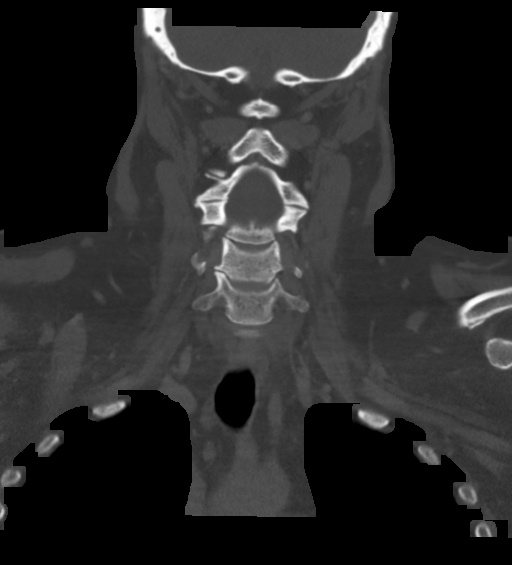

[Series 6: ax oropharynx · axial · 0.49mm/px · z∈[-170,-128]mm · 2 of 126 slices shown]
[im 21/126  bone]
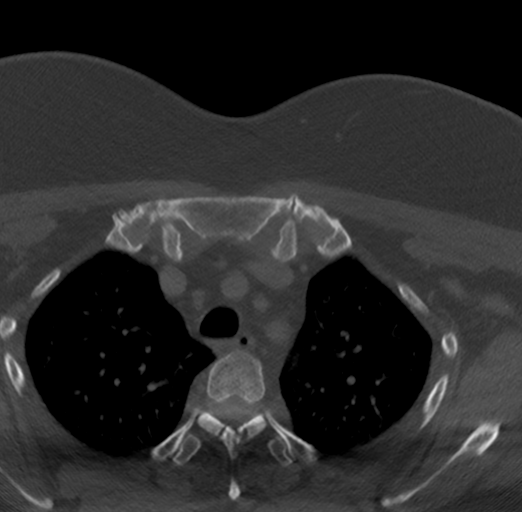
[im 42/126  bone]
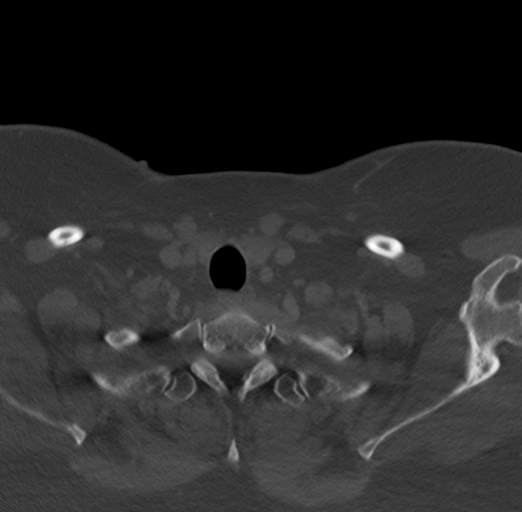

[Series 7: sag neck · sagittal · 0.49mm/px · 6 of 151 slices shown]
[im 26/151  bone]
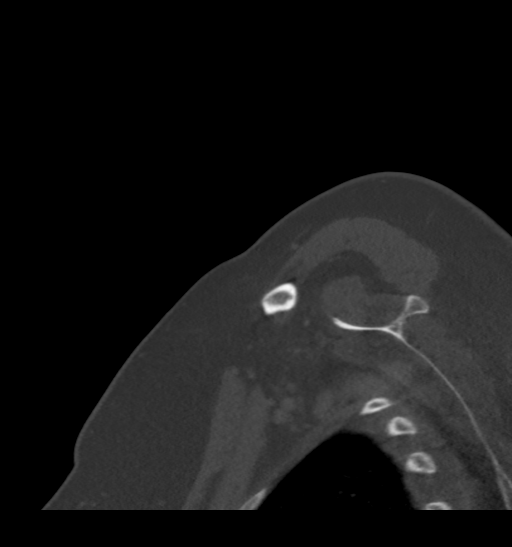
[im 29/151  soft-tissue]
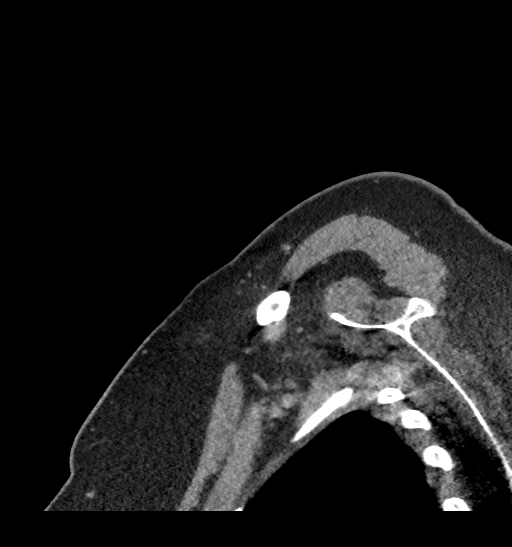
[im 51/151  bone]
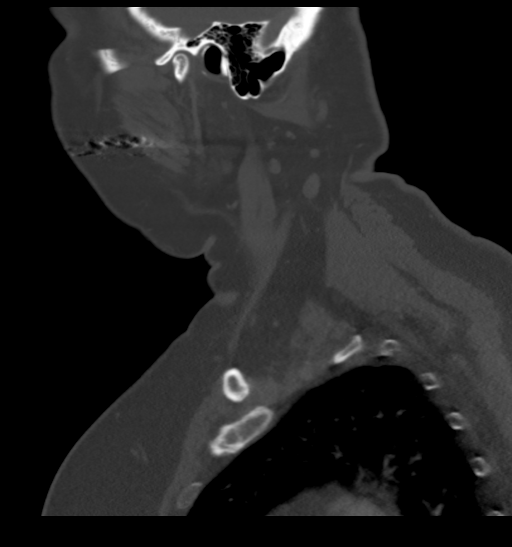
[im 76/151  bone]
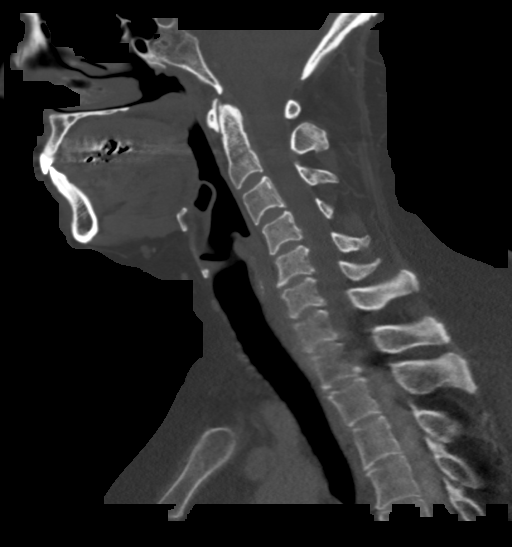
[im 101/151  bone]
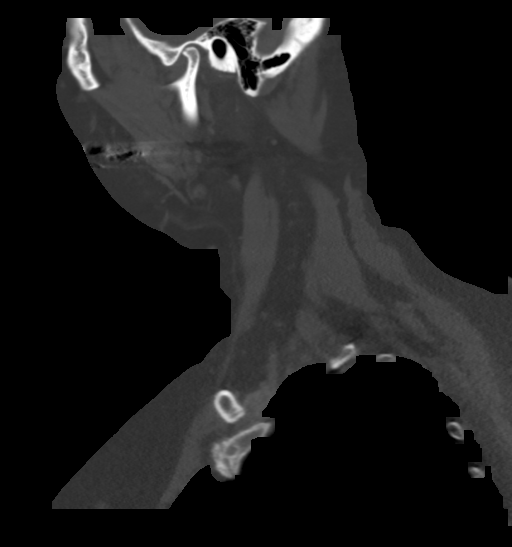
[im 126/151  bone]
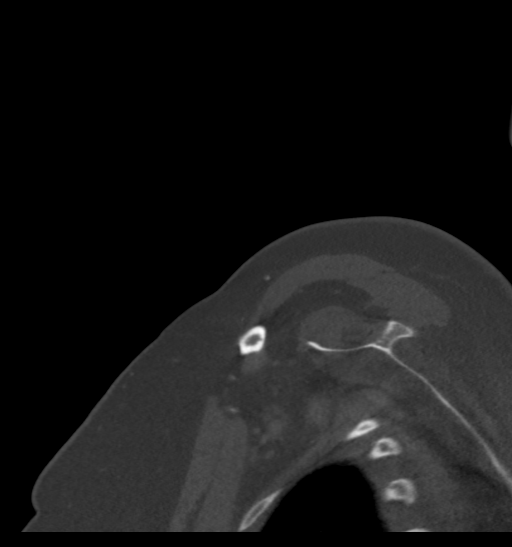

[16 of 36 positions shown; findings below may reference images not displayed]

FINDINGS: Pharynx and larynx: Streak and beam hardening artifact related to
dental restoration limits evaluation of the oral cavity. No
appreciable swelling or discrete mass within the oral cavity,
pharynx or larynx. The epiglottis is unremarkable.

Salivary glands: No inflammation, mass, or stone.

Thyroid: Subcentimeter left thyroid lobe nodule not meeting
consensus criteria for ultrasound follow-up.

Lymph nodes: No pathologically enlarged cervical chain lymph nodes
are identified.

Vascular: The major vascular structures of the neck are patent.

Limited intracranial: No abnormality identified.

Visualized orbits: Incompletely imaged. Visualized orbits show no
acute finding.

Mastoids and visualized paranasal sinuses: No significant paranasal
sinus disease or mastoid effusion at the imaged levels.

Skeleton: No acute bony abnormality or aggressive osseous lesion.
Cervical spondylosis without high-grade bony spinal canal narrowing.

Upper chest: No consolidation within the imaged lung apices.
IMPRESSION: There is no appreciable soft tissue neck mass. No specific cause for
dysphagia is identified.

## 2021-07-22 ENCOUNTER — Encounter (INDEPENDENT_AMBULATORY_CARE_PROVIDER_SITE_OTHER): Payer: Self-pay | Admitting: Internal Medicine

## 2021-07-23 ENCOUNTER — Other Ambulatory Visit (INDEPENDENT_AMBULATORY_CARE_PROVIDER_SITE_OTHER): Payer: Self-pay | Admitting: Internal Medicine

## 2021-07-23 MED ORDER — TRULICITY 1.5 MG/0.5ML ~~LOC~~ SOAJ: 1.5000 mg | SUBCUTANEOUS | 3 refills | Status: DC

## 2021-07-27 ENCOUNTER — Institutional Professional Consult (permissible substitution): Payer: Medicare Other | Admitting: Pulmonary Disease

## 2021-08-06 ENCOUNTER — Encounter (INDEPENDENT_AMBULATORY_CARE_PROVIDER_SITE_OTHER): Payer: Self-pay | Admitting: Nurse Practitioner

## 2021-08-06 ENCOUNTER — Other Ambulatory Visit (INDEPENDENT_AMBULATORY_CARE_PROVIDER_SITE_OTHER): Payer: Self-pay | Admitting: Nurse Practitioner

## 2021-08-06 DIAGNOSIS — E1100 Type 2 diabetes mellitus with hyperosmolarity without nonketotic hyperglycemic-hyperosmolar coma (NKHHC): Secondary | ICD-10-CM

## 2021-08-06 DIAGNOSIS — E1165 Type 2 diabetes mellitus with hyperglycemia: Secondary | ICD-10-CM

## 2021-08-06 NOTE — Progress Notes (Signed)
Patient requesting referral to [place] with Dr. Maretta Bees for diabetes treatment.

## 2021-08-07 ENCOUNTER — Other Ambulatory Visit (INDEPENDENT_AMBULATORY_CARE_PROVIDER_SITE_OTHER): Payer: Self-pay | Admitting: Nurse Practitioner

## 2021-08-07 DIAGNOSIS — E1165 Type 2 diabetes mellitus with hyperglycemia: Secondary | ICD-10-CM

## 2021-08-07 MED ORDER — ACCU-CHEK GUIDE VI STRP
1.0000 | ORAL_STRIP | Freq: Three times a day (TID) | 3 refills | Status: AC
Start: 2021-08-07 — End: ?

## 2021-08-13 ENCOUNTER — Encounter (INDEPENDENT_AMBULATORY_CARE_PROVIDER_SITE_OTHER): Payer: Self-pay | Admitting: Nurse Practitioner

## 2021-08-15 ENCOUNTER — Encounter (INDEPENDENT_AMBULATORY_CARE_PROVIDER_SITE_OTHER): Payer: Self-pay | Admitting: Nurse Practitioner

## 2021-08-16 ENCOUNTER — Other Ambulatory Visit (INDEPENDENT_AMBULATORY_CARE_PROVIDER_SITE_OTHER): Payer: Self-pay | Admitting: Nurse Practitioner

## 2021-08-16 ENCOUNTER — Encounter (INDEPENDENT_AMBULATORY_CARE_PROVIDER_SITE_OTHER): Payer: Self-pay | Admitting: Nurse Practitioner

## 2021-08-16 DIAGNOSIS — E1165 Type 2 diabetes mellitus with hyperglycemia: Secondary | ICD-10-CM

## 2021-08-16 MED ORDER — TRULICITY 3 MG/0.5ML ~~LOC~~ SOAJ: 3.0000 mg | SUBCUTANEOUS | 2 refills | Status: DC

## 2021-08-22 ENCOUNTER — Encounter (INDEPENDENT_AMBULATORY_CARE_PROVIDER_SITE_OTHER): Payer: Self-pay | Admitting: Nurse Practitioner

## 2021-08-26 ENCOUNTER — Encounter (INDEPENDENT_AMBULATORY_CARE_PROVIDER_SITE_OTHER): Payer: Self-pay | Admitting: Nurse Practitioner

## 2021-08-27 ENCOUNTER — Other Ambulatory Visit (INDEPENDENT_AMBULATORY_CARE_PROVIDER_SITE_OTHER): Payer: Self-pay | Admitting: Nurse Practitioner

## 2021-08-27 DIAGNOSIS — Z780 Asymptomatic menopausal state: Secondary | ICD-10-CM

## 2021-08-27 MED ORDER — PROGESTERONE 200 MG PO CAPS: 400.0000 mg | ORAL_CAPSULE | Freq: Every day | ORAL | 0 refills | Status: DC

## 2021-09-05 ENCOUNTER — Ambulatory Visit: Payer: Self-pay | Admitting: Neurology

## 2021-09-05 ENCOUNTER — Other Ambulatory Visit (INDEPENDENT_AMBULATORY_CARE_PROVIDER_SITE_OTHER): Payer: Self-pay | Admitting: Nurse Practitioner

## 2021-09-06 ENCOUNTER — Ambulatory Visit (INDEPENDENT_AMBULATORY_CARE_PROVIDER_SITE_OTHER): Payer: Medicare Other | Admitting: Internal Medicine

## 2021-09-13 ENCOUNTER — Institutional Professional Consult (permissible substitution): Payer: Medicare Other | Admitting: Pulmonary Disease

## 2021-09-17 DIAGNOSIS — Z9189 Other specified personal risk factors, not elsewhere classified: Secondary | ICD-10-CM

## 2021-09-17 NOTE — Progress Notes (Signed)
[place] [facility])                                            [place] Team                                        Statin Quality Measure Assessment    09/17/2021  Japera Wahls April 25, 1966 [id number]  Dr. Posey Pronto,   I am a [facility] clinical pharmacist that reviews patients for statin quality initiatives.     Per review of chart and payor information, patient has a diagnosis of diabetes but is not currently filling a statin prescription.  This places patient into the SUPD (Statin Use In Patients with Diabetes) measure for CMS.    I could not find any documentation of previous trial of a statin or a history of statin intolerance.   The 10-year ASCVD risk score (Arnett DK, et al., 2019) is: 8.3%   Values used to calculate the score:     Age: 55 years     Sex: Female     Is Non-Hispanic African American: No     Diabetic: Yes     Tobacco smoker: No     Systolic Blood Pressure: [id number] mmHg     Is BP treated: Yes     HDL Cholesterol: 40 mg/dL     Total Cholesterol: 208 mg/dL 01/21/2020     Component Value Date/Time   CHOL 208 (H) 01/21/2020 1009   TRIG 208 (H) 01/21/2020 1009   HDL 40 (L) 01/21/2020 1009   CHOLHDL 5.2 (H) 01/21/2020 1009   LDLCALC 133 (H) 01/21/2020 1009    Please consider ONE of the following recommendations:  Initiate high intensity statin Atorvastatin '40mg'$  once daily, #90, 3 refills   Rosuvastatin '20mg'$  once daily, #90, 3 refills    Initiate moderate intensity          statin with reduced frequency if prior          statin intolerance 1x weekly, #13, 3 refills   2x weekly, #26, 3 refills   3x weekly, #39, 3 refills    Code for past statin intolerance or  other exclusions (required annually)  Provider Requirements: Associate code during an office visit or telehealth encounter  Drug Induced Myopathy G72.0   Myopathy, unspecified G72.9   Myositis, unspecified M60.9   Rhabdomyolysis [id number]   Alcoholic  fatty liver 99991111   Cirrhosis of liver K74.69   Prediabetes R73.03   PCOS E28.2   Toxic liver disease, unspecified K71.9   Adverse effect of antihyperlipidemic and antiarteriosclerotic drugs, initial encounter T46.6X5A    Please let us know your decision.    Thank you!  Reed Breech, PharmD Clinical Pharmacist  [place] [phone]

## 2021-09-19 ENCOUNTER — Other Ambulatory Visit: Payer: Self-pay

## 2021-09-19 ENCOUNTER — Ambulatory Visit (INDEPENDENT_AMBULATORY_CARE_PROVIDER_SITE_OTHER): Payer: Medicare Other | Admitting: Internal Medicine

## 2021-09-19 ENCOUNTER — Encounter: Payer: Self-pay | Admitting: Internal Medicine

## 2021-09-19 VITALS — BP 125/81 | HR 115 | Temp 98.3°F | Resp 18 | Ht 69.0 in | Wt 258.1 lb

## 2021-09-19 DIAGNOSIS — Z9229 Personal history of other drug therapy: Secondary | ICD-10-CM

## 2021-09-19 DIAGNOSIS — K219 Gastro-esophageal reflux disease without esophagitis: Secondary | ICD-10-CM

## 2021-09-19 DIAGNOSIS — E669 Obesity, unspecified: Secondary | ICD-10-CM

## 2021-09-19 DIAGNOSIS — Z7689 Persons encountering health services in other specified circumstances: Secondary | ICD-10-CM | POA: Diagnosis not present

## 2021-09-19 DIAGNOSIS — E1142 Type 2 diabetes mellitus with diabetic polyneuropathy: Secondary | ICD-10-CM

## 2021-09-19 DIAGNOSIS — E6609 Other obesity due to excess calories: Secondary | ICD-10-CM

## 2021-09-19 DIAGNOSIS — R000 Tachycardia, unspecified: Secondary | ICD-10-CM

## 2021-09-19 DIAGNOSIS — F322 Major depressive disorder, single episode, severe without psychotic features: Secondary | ICD-10-CM | POA: Diagnosis not present

## 2021-09-19 DIAGNOSIS — E782 Mixed hyperlipidemia: Secondary | ICD-10-CM

## 2021-09-19 DIAGNOSIS — M797 Fibromyalgia: Secondary | ICD-10-CM

## 2021-09-19 DIAGNOSIS — E1165 Type 2 diabetes mellitus with hyperglycemia: Secondary | ICD-10-CM

## 2021-09-19 DIAGNOSIS — G4733 Obstructive sleep apnea (adult) (pediatric): Secondary | ICD-10-CM

## 2021-09-19 LAB — POCT GLYCOSYLATED HEMOGLOBIN (HGB A1C): Hemoglobin A1C: 12.4 % — AB (ref 4.0–5.6)

## 2021-09-19 MED ORDER — TRULICITY 3 MG/0.5ML ~~LOC~~ SOAJ: 3.0000 mg | SUBCUTANEOUS | 2 refills | Status: DC

## 2021-09-19 MED ORDER — FREESTYLE LIBRE 2 SENSOR MISC: 2 refills | Status: DC

## 2021-09-19 MED ORDER — EMPAGLIFLOZIN 10 MG PO TABS: 10.0000 mg | ORAL_TABLET | Freq: Every day | ORAL | 2 refills | Status: DC

## 2021-09-19 NOTE — Assessment & Plan Note (Addendum)
  Lab Results  Component Value Date   HGBA1C 12.4 (A) 09/19/2021   On glipizide and Trulicity, Trulicity refilled today Added Jardiance 10 mg QD She would benefit from CGM for better blood glucose monitoring, freestyle libre prescribed Referred to clinical pharmacist for medication assistance and adherence Advised to follow diabetic diet On ARB, not on statin - plan to start it in the next visit F/u CMP and lipid panel Diabetic eye exam: Advised to follow up with Ophthalmology for diabetic eye exam

## 2021-09-19 NOTE — Patient Instructions (Signed)
Please start taking Jardiance as prescribed.  Please stop taking NP thyroid and Progesterone. Please continue to take other medications as prescribed.  Please continue to follow low carb diet and perform moderate exercise/walking as tolerated.  Please get fasting blood tests done before the next visit.

## 2021-09-19 NOTE — Assessment & Plan Note (Signed)
On pantoprazole Followed by GI 

## 2021-09-19 NOTE — Assessment & Plan Note (Signed)
Not on statin, plan to start it in the next visit

## 2021-09-19 NOTE — Assessment & Plan Note (Signed)
Care established Previous chart reviewed History and medications reviewed with the patient 

## 2021-09-19 NOTE — Assessment & Plan Note (Signed)
Advised to follow low-carb diet and moderate exercise/walking as tolerated On Trulicity

## 2021-09-19 NOTE — Assessment & Plan Note (Signed)
On metoprolol 50 mg twice daily DC NP thyroid as it can worsen tachycardia

## 2021-09-19 NOTE — Assessment & Plan Note (Signed)
On Lyrica

## 2021-09-19 NOTE — Assessment & Plan Note (Signed)
On NP thyroid and progesterone currently Hormone levels WNL according to chart review DC NP thyroid and progesterone due to medical comorbidities and to reduce medication burden/polypharmacy

## 2021-09-19 NOTE — Progress Notes (Signed)
New Patient Office Visit  Subjective:  Patient ID: Sydney Taylor, female    DOB: 1966/04/26  Age: 55 y.o. MRN: [id number]  CC:  Chief Complaint  Patient presents with   New Patient (Initial Visit)    Discuss blood sugar and meds, thyroid    HPI Sydney Taylor is a 55 y.o. female with PMH of OSA, GERD, type II DM with diabetic neuropathy, HLD, tachycardia, depression with anxiety, fibromyalgia and obesity who presents for establishing care.  She has history of type II DM with neuropathy.  She takes Trulicity and glipizide currently.  She did not tolerate GI side effects of Metformin in the past.  Her HbA1C was 12.4 in the office today.  Her blood glucose has been running around 200 at home.  She has chronic fatigue, but denies any polyuria or polyphagia.  She takes Lyrica for diabetic neuropathy.  She has a history of sinus tachycardia with ventricular trigeminy, for which she takes metoprolol.  She denies any chest pain, dyspnea or palpitations currently.  She uses CPAP for OSA regularly.  She takes Wellbutrin and Cymbalta for depression.  She also has history of fibromyalgia, for which she did physical therapy after Rheumatology evaluation.  She is on NP thyroid and progesterone currently for fatigue.  She denies any difference in her symptoms since starting hormone therapies.  She was also on estrogen therapy, which was stopped due to postmenopausal bleeding.  She was told of having PCOS by a provider, but she does not have [place] or [place] testing done. Her hormone studies were WNL.  She denies any hirsutism symptoms currently.  She has had 3 doses of COVID-vaccine.  Past Medical History:  Diagnosis Date   Anemia    Anxiety    Arrhythmia    Bipolar depression ([place]) 12/28/2019   Chronic fatigue    Chronic headaches    Depression    Diabetes mellitus without complication (HCC)    Fibromyalgia    GERD (gastroesophageal reflux disease)    High cholesterol    HLD (hyperlipidemia)     Hypertension    Insect bite    Migraine    Prediabetes    Sleep apnea    Urinary incontinence     Past Surgical History:  Procedure Laterality Date   ESOPHAGOGASTRODUODENOSCOPY N/A 05/12/2020   Procedure: ESOPHAGOGASTRODUODENOSCOPY (EGD);  Surgeon: Daneil Dolin, MD;  Location: [facility];  Service: Endoscopy;  Laterality: N/A;  11:15am   Joint fusion on foot Right    MALONEY DILATION N/A 05/12/2020   Procedure: MALONEY DILATION;  Surgeon: Daneil Dolin, MD;  Location: [facility];  Service: Endoscopy;  Laterality: N/A;   NASAL SEPTUM SURGERY     SINOSCOPY      Family History  Problem Relation Age of Onset   Hypertension Father    Hyperlipidemia Father    Diabetes Father    Stroke Father    Depression Father    Anxiety disorder Father    Hypertension Mother    Hyperlipidemia Mother    Diabetes Mellitus II Mother    Congestive Heart Failure Mother    Heart attack Mother    Hypertension Sister    Diabetes Sister    Hypertension Brother    Autoimmune disease Brother    Diabetes Maternal Grandmother    Lupus Paternal Grandmother    Depression Paternal Grandmother    Anxiety disorder Paternal Grandmother    Depression Maternal Aunt    Anxiety disorder Maternal Aunt  Colon cancer Neg Hx    Esophageal cancer Neg Hx    Gastric cancer Neg Hx     Social History   Socioeconomic History   Marital status: Married    Spouse name: Not on file   Number of children: 0   Years of education: Not on file   Highest education level: Bachelor's degree (e.g., BA, AB, BS)  Occupational History   Occupation: research associate/drug development  Tobacco Use   Smoking status: Former    Types: Cigarettes    Quit date: 12/30/1982    Years since quitting: 38.7   Smokeless tobacco: Never  Vaping Use   Vaping Use: Never used  Substance and Sexual Activity   Alcohol use: Not Currently    Alcohol/week: 0.0 standard drinks   Drug use: Never   Sexual activity: Yes    Birth  control/protection: Post-menopausal  Other Topics Concern   Not on file  Social History Narrative   Married for 3 years.Lives with husband.Bachelors in Education officer, museum.   Right handed   Caffeine: 1-2 per day    Social Determinants of Health   Financial Resource Strain: Not on file  Food Insecurity: Not on file  Transportation Needs: Not on file  Physical Activity: Not on file  Stress: Not on file  Social Connections: Not on file  Intimate Partner Violence: Not on file    ROS Review of Systems  Constitutional:  Positive for fatigue. Negative for chills and fever.  HENT:  Negative for congestion, sinus pressure, sinus pain and sore throat.   Eyes:  Negative for pain and discharge.  Respiratory:  Negative for cough and shortness of breath.   Cardiovascular:  Negative for chest pain and palpitations.  Gastrointestinal:  Negative for abdominal pain, diarrhea, nausea and vomiting.  Endocrine: Negative for polydipsia and polyuria.  Genitourinary:  Negative for dysuria and hematuria.  Musculoskeletal:  Negative for neck pain and neck stiffness.  Skin:  Negative for rash.  Neurological:  Negative for dizziness and weakness.  Psychiatric/Behavioral:  Negative for agitation and behavioral problems.    Objective:   Today's Vitals: BP 125/81 (BP Location: Right Arm, Patient Position: Sitting, Cuff Size: Large)   Pulse (!) 115   Temp 98.3 F (36.8 C) (Oral)   Resp 18   Ht 5' 9"  (1.753 m)   Wt 258 lb 1.3 oz (117.1 kg)   LMP  (LMP Unknown)   SpO2 (!) 88%   BMI 38.11 kg/m   Physical Exam Constitutional:      General: She is not in acute distress.    Appearance: She is obese. She is not diaphoretic.  HENT:     Head: Normocephalic and atraumatic.     Nose: No congestion.     Mouth/Throat:     Mouth: Mucous membranes are dry.     Pharynx: No posterior oropharyngeal erythema.  Eyes:     General: No scleral icterus.    Extraocular Movements: Extraocular movements intact.   Cardiovascular:     Rate and Rhythm: Normal rate and regular rhythm.     Pulses: Normal pulses.     Heart sounds: Normal heart sounds. No murmur heard. Pulmonary:     Breath sounds: Normal breath sounds. No wheezing or rales.  Abdominal:     Palpations: Abdomen is soft.     Tenderness: There is no abdominal tenderness.  Musculoskeletal:     Cervical back: Neck supple. No tenderness.     Right lower leg: Edema (Trace) present.  Left lower leg: Edema (Trace) present.  Skin:    General: Skin is dry.     Coloration: Skin is not pale.     Findings: No rash.  Neurological:     General: No focal deficit present.     Mental Status: She is alert and oriented to person, place, and time.     Sensory: No sensory deficit.     Motor: No weakness.  Psychiatric:        Mood and Affect: Mood is depressed. Affect is flat.        Behavior: Behavior normal.        Thought Content: Thought content normal.    Assessment & Plan:   Problem List Items Addressed This Visit       Encounter to establish care - Primary   Care established Previous chart reviewed History and medications reviewed with the patient     Relevant Orders  CBC with Differential/Platelet  CMP14+EGFR  TSH + free T4  Vitamin D 1,25 dihydroxy    Respiratory   Sleep apnea    Uses CPAP regularly        Digestive   GERD (gastroesophageal reflux disease)    On pantoprazole Followed by GI        Endocrine   Type 2 diabetes mellitus ([place])     Lab Results  Component Value Date   HGBA1C 12.4 (A) 09/19/2021  On glipizide and Trulicity, Trulicity refilled today Added Jardiance 10 mg QD She would benefit from CGM for better blood glucose monitoring, freestyle libre prescribed Referred to clinical pharmacist for medication assistance and adherence Advised to follow diabetic diet On ARB, not on statin - plan to start it in the next visit F/u CMP and lipid panel Diabetic eye exam: Advised to follow up with  Ophthalmology for diabetic eye exam      Relevant Medications   empagliflozin (JARDIANCE) 10 MG TABS tablet   Dulaglutide (TRULICITY) 3 KZ/6.0FU SOPN   Continuous Blood Gluc Sensor (FREESTYLE LIBRE 2 SENSOR) MISC   Other Relevant Orders   AMB Referral to [facility]   CMP14+EGFR   Lipid Profile   Urinalysis   Hemoglobin A1c   POCT HgB A1C (Completed)   Diabetic polyneuropathy associated with type 2 diabetes mellitus (HCC)    On Lyrica      Relevant Medications   empagliflozin (JARDIANCE) 10 MG TABS tablet   Dulaglutide (TRULICITY) 3 XN/2.3FT SOPN     Other   Hyperlipidemia    Not on statin, plan to start it in the next visit      Relevant Orders   Lipid Profile   Obesity (BMI 30-39.9)    Advised to follow low-carb diet and moderate exercise/walking as tolerated On Trulicity      Relevant Medications   empagliflozin (JARDIANCE) 10 MG TABS tablet   Dulaglutide (TRULICITY) 3 DD/2.2GU SOPN   Tachycardia    On metoprolol 50 mg twice daily DC NP thyroid as it can worsen tachycardia      Relevant Orders   TSH + free T4   Major depressive disorder, single episode, severe (HCC)    On Wellbutrin Followed by psychiatry      Relevant Orders   AMB Referral to [facility]   Vitamin D 1,25 dihydroxy   Fibromyalgia syndrome    On Cymbalta for depression, which can also help with fibromyalgia Has had PT after rheumatology evaluation         History of hormone therapy  On NP thyroid and progesterone currently Hormone levels WNL according to chart review DC NP thyroid and progesterone due to medical comorbidities and to reduce medication burden/polypharmacy       Outpatient Encounter Medications as of 09/19/2021  Medication Sig   ALPRAZolam (XANAX) 0.5 MG tablet Take 0.5 mg by mouth daily as needed.   blood glucose meter kit and supplies KIT Inject 1 each into the skin daily. Check blood glucose once a day. Diagnosis code: E 11.9.    buPROPion (WELLBUTRIN SR) 100 MG 12 hr tablet Take 100 mg by mouth at bedtime.   chlorthalidone (HYGROTON) 25 MG tablet TAKE 1 TABLET (25 MG TOTAL) BY MOUTH DAILY.   Cholecalciferol (VITAMIN D3) 125 MCG (5000 UT) TABS Take 2 tablets (10,000 Units total) by mouth daily.   Continuous Blood Gluc Sensor (FREESTYLE LIBRE 2 SENSOR) MISC Please use it to check blood glucose 3 times daily before meals and nightly   Cyanocobalamin (VITAMIN B12) 1000 MCG TBCR daily.   DULoxetine (CYMBALTA) 60 MG capsule Take 60 mg by mouth daily.   empagliflozin (JARDIANCE) 10 MG TABS tablet Take 1 tablet (10 mg total) by mouth daily before breakfast.   glipiZIDE (GLUCOTROL) 10 MG tablet TAKE 1 TABLET BY MOUTH TWICE DAILY BEFORE A MEAL   glucose blood (ACCU-CHEK GUIDE) test strip 1 each by Other route in the morning, at noon, and at bedtime. Use as instructed   losartan (COZAAR) 25 MG tablet TAKE 1 TABLET BY MOUTH EVERY DAY   metoprolol tartrate (LOPRESSOR) 50 MG tablet Take 1 tablet (50 mg total) by mouth 2 (two) times daily. No more refills will be approved by this provider as office is now permanently closed, patient will need to find another primary care provider.   Pantoprazole Sodium (PROTONIX PO) Take 40 mg by mouth daily.   potassium chloride SA (KLOR-CON) 20 MEQ tablet Take by mouth. Take 2 tablets in the morning and 1 tablet in the evening.   pregabalin (LYRICA) 75 MG capsule Take 75 mg by mouth daily.   [DISCONTINUED] Dulaglutide (TRULICITY) 3 RA/3.0NM SOPN Inject 3 mg as directed once a week.   [DISCONTINUED] NP THYROID 90 MG tablet TAKE 1 TABLET BY MOUTH EVERY DAY   [DISCONTINUED] progesterone (PROMETRIUM) 200 MG capsule Take 2 capsules (400 mg total) by mouth daily.   Dulaglutide (TRULICITY) 3 MH/6.8GS SOPN Inject 3 mg as directed once a week.   ondansetron (ZOFRAN) 4 MG tablet Take 1 tablet (4 mg total) by mouth every 8 (eight) hours as needed for nausea or vomiting. (Patient not taking: Reported on 09/19/2021)    [DISCONTINUED] azelastine (ASTELIN) 0.1 % nasal spray SPRAY 1 SPRAY INTO BOTH NOSTRILS TWO TIMES A DAY   [DISCONTINUED] DULoxetine (CYMBALTA) 30 MG capsule Take 60 mg by mouth daily. (Patient not taking: Reported on 09/19/2021)   [DISCONTINUED] fluconazole (DIFLUCAN) 150 MG tablet Take 150 mg by mouth daily.   [DISCONTINUED] fluticasone (FLONASE) 50 MCG/ACT nasal spray Place 1 spray into both nostrils 2 (two) times daily.   No facility-administered encounter medications on file as of 09/19/2021.    Follow-up: Return in about 3 months (around 12/19/2021) for [place] physical.   Lindell Spar, MD

## 2021-09-19 NOTE — Assessment & Plan Note (Addendum)
On Wellbutrin and Cymbalta Followed by psychiatry

## 2021-09-19 NOTE — Assessment & Plan Note (Signed)
Uses CPAP regularly °

## 2021-09-19 NOTE — Assessment & Plan Note (Signed)
On Cymbalta for depression, which can also help with fibromyalgia Has had PT after rheumatology evaluation

## 2021-09-21 ENCOUNTER — Encounter: Payer: Self-pay | Admitting: Internal Medicine

## 2021-09-24 ENCOUNTER — Telehealth: Payer: Self-pay | Admitting: *Deleted

## 2021-09-24 NOTE — Chronic Care Management (AMB) (Signed)
  Chronic Care Management   Note  09/24/2021 Name: Money Mckeithan MRN: [id number] DOB: [birth year]  Virga Haltiwanger is a 55 y.o. year old female who is a primary care patient of Lindell Spar, MD. I reached out to [name] by phone today in response to a referral sent by Ms. Azalee Course PCP.  Ms. Merced was given information about Chronic Care Management services today including:  CCM service includes personalized support from designated clinical staff supervised by her physician, including individualized plan of care and coordination with other care providers 24/7 contact phone numbers for assistance for urgent and routine care needs. Service will only be billed when office clinical staff spend 20 minutes or more in a month to coordinate care. Only one practitioner may furnish and bill the service in a calendar month. The patient may stop CCM services at any time (effective at the end of the month) by phone call to the office staff. The patient is responsible for co-pay (up to 20% after annual deductible is met) if co-pay is required by the individual health plan.   Patient agreed to services and verbal consent obtained.   Follow up plan: Face to Face appointment with care management team member scheduled for: 10/10/21  [facility]  Direct Dial: [phone]

## 2021-09-27 ENCOUNTER — Encounter: Payer: Self-pay | Admitting: Internal Medicine

## 2021-10-04 ENCOUNTER — Encounter: Payer: Self-pay | Admitting: Internal Medicine

## 2021-10-04 ENCOUNTER — Other Ambulatory Visit: Payer: Self-pay | Admitting: *Deleted

## 2021-10-04 DIAGNOSIS — Z1231 Encounter for screening mammogram for malignant neoplasm of breast: Secondary | ICD-10-CM

## 2021-10-10 ENCOUNTER — Other Ambulatory Visit: Payer: Self-pay

## 2021-10-10 ENCOUNTER — Ambulatory Visit (INDEPENDENT_AMBULATORY_CARE_PROVIDER_SITE_OTHER): Payer: Medicare Other | Admitting: Pharmacist

## 2021-10-10 DIAGNOSIS — E782 Mixed hyperlipidemia: Secondary | ICD-10-CM

## 2021-10-10 DIAGNOSIS — E1165 Type 2 diabetes mellitus with hyperglycemia: Secondary | ICD-10-CM

## 2021-10-10 NOTE — Patient Instructions (Signed)
Sydney Taylor,  It was great to talk to you today!  Please call me with any questions or concerns.   Visit Information   PATIENT GOALS:   Goals Addressed             This Visit's Progress    Medication Management        Patient Goals/Self-Care Activities Patient will:  Take medications as prescribed Check blood sugar three times a day at the following times: fasting (at least 8 hours since last food consumption), bedtime, and 1-2 hours after a meal, document, and provide at future appointments Collaborate with provider on medication access solutions Engage in dietary modifications by less frequent dining out and fewer sweetened foods & beverages        Consent to CCM Services: [name] was given information about Chronic Care Management services including:  CCM service includes personalized support from designated clinical staff supervised by her physician, including individualized plan of care and coordination with other care providers 24/7 contact phone numbers for assistance for urgent and routine care needs. Service will only be billed when office clinical staff spend 20 minutes or more in a month to coordinate care. Only one practitioner may furnish and bill the service in a calendar month. The patient may stop CCM services at any time (effective at the end of the month) by phone call to the office staff. The patient will be responsible for cost sharing (co-pay) of up to 20% of the service fee (after annual deductible is met).  Patient agreed to services and verbal consent obtained.   Patient verbalizes understanding of instructions provided today and agrees to view in [place].   Telephone follow up appointment with care management team member scheduled for:10/11/21  Kennon Holter, PharmD Clinical Pharmacist [facility] [phone]  CLINICAL CARE PLAN: Patient Care Plan: Medication Management     Problem Identified: Diabetes and  Hyperlipidemia   Priority: High  Onset Date: 10/10/2021     Long-Range Goal: Disease Progression Prevention   Start Date: 10/10/2021  Expected End Date: 01/08/2022  This Visit's Progress: On track  Priority: High  Note:   Current Barriers:  Unable to achieve control of diabetes and hyperlipidemia Suboptimal therapeutic regimen for hyperlipidemia  Pharmacist Clinical Goal(s):  Patient will achieve control of diabetes and hyperlipidemia as evidenced by improved fasting blood sugar, improved A1c, and improved LDL adhere to plan to optimize therapeutic regimen for hyperlipidemia as evidenced by report of adherence to recommended medication management changes through collaboration with PharmD and provider.   Interventions: 1:1 collaboration with Lindell Spar, MD regarding development and update of comprehensive plan of care as evidenced by provider attestation and co-signature Inter-disciplinary care team collaboration (see longitudinal plan of care) Comprehensive medication review performed; medication list updated in electronic medical record  Type 2 Diabetes - New goal. (Patient to establish care with endocrinology Dr. Loanne Drilling on 10/11/21) Uncontrolled; Most recent A1c above goal of <7% per ADA guidelines Current medications: dulaglutide (Trulicity) 3 mg subcutaneously weekly, empagliflozin (Jardiance) 10 mg by mouth once daily, and glipizide IR 10 mg by mouth twice daily with meals Intolerances:  GI upset with metformin Taking medications as directed: yes Side effects thought to be attributed to current medication regimen: yes, patient reports some nausea and vomiting likely associated with Trulicity Denies hypoglycemic symptoms (sweaty and shaky). Hypoglycemia prevention: not indicated at this time Current meal patterns:  patient is a vegetarian. Reports that she does not drink soda, juice, or milk. Does not  add sugar to her coffee. Eats 3 meals a day. Eats 1-2 pieces of bread per  day. Does not eat sweats. Current exercise:  walks some but is generally not very active due to fatigue On a statin: no, but will recommend On aspirin 81 mg daily: no Last microalbumin/creatinine ratio: 5 (12/18/20); on an ACEi/ARB: yes Last eye exam: completed within last year Last foot exam: completed within last year Pneumonia vaccine: up to date Influenza vaccine:  needs this fall Shingles: series complete Current glucose readings: patient checks finger stick blood glucose three time daily. fasting blood glucose: above goal of 80-130 mg/dL per ADA guidelines. post prandial glucose: above goal of <180 mg/dL per ADA guidelines. Patient reports fasting blood glucose range is 220-250, postprandial is low 300s, and bedtime is ~250 ; patient reports this is an improvement from before starting Trulicity and Jardiance. The following education materials were given to the patient: carbohydrate guide, dining out with Diabetes, and Planning healthy meals Continue current medications as above for now. Will follow-up on recommendations from endocrinology and support patient as needed. Given level of hyperglycemia, patient may require insulin therapy. Work on reducing carbohydrate intake  Decrease meal sizes to reduce nausea/vomiting with Trulicity. Will continue to follow. May need to lower the dose of Trulicity if patient unable to tolerate.  Patient unable to get [facility] as this is not covered under her pharmacy benefits. This is only covered under DME supplier as she has Medicare. Patient has a [employer] which is not compatible with [employer] but is compatible with Dexcom G6. Will facilitate prescription to DME for Dexcom G6.   Hyperlipidemia - New goal.: Uncontrolled. LDL above goal of <70 due to very high risk given diabetes + at least 1 additional major risk factor (hypertension) per 2020 AACE/ACE guidelines. Triglycerides above goal of <150 per 2020 AACE/ACE guidelines. Current  medications:  none; patient was previously prescribed atorvastatin but never took it Intolerances: unknown Taking medications as directed: n/a Side effects thought to be attributed to current medication regimen: n/a Recommend addition of at least moderate intensity statin. Will discuss more at future visits  Patient Goals/Self-Care Activities Patient will:  Take medications as prescribed Check blood sugar three times a day at the following times: fasting (at least 8 hours since last food consumption), bedtime, and 1-2 hours after a meal, document, and provide at future appointments Collaborate with provider on medication access solutions Engage in dietary modifications by less frequent dining out and fewer sweetened foods & beverages  Follow Up Plan: Telephone follow up appointment with care management team member scheduled for: 10/11/21

## 2021-10-10 NOTE — Chronic Care Management (AMB) (Signed)
Chronic Care Management Pharmacy Note  10/10/2021 Name:  Sydney Taylor MRN:  [id number] DOB:  11-22-1966  Summary:  Type 2 Diabetes - New goal. (Patient to establish care with endocrinology Dr. Loanne Drilling on 10/11/21) Patient reports some nausea and vomiting likely associated with Trulicity. Decrease meal sizes to reduce nausea/vomiting with Trulicity. Will continue to follow. May need to lower the dose of Trulicity if patient unable to tolerate.  Patient is not on a statin but will recommend Patient reports fasting blood glucose range is 220-250, postprandial is low 300s, and bedtime is ~250 Continue current medications for now. Will follow-up on recommendations from endocrinology and support patient as needed. Given level of hyperglycemia, patient may require insulin therapy.  Patient unable to get [facility] as this is not covered under her pharmacy benefits. This is only covered under DME supplier as she has Medicare. Patient has a [employer] which is not compatible with [employer] but is compatible with Dexcom G6. Will facilitate prescription to DME for Dexcom G6.   Hyperlipidemia - New goal.: Patient was previously prescribed atorvastatin but never took it Recommend addition of at least moderate intensity statin. Will discuss more at future visits   Subjective: Sydney Taylor is an 55 y.o. year old female who is a primary patient of Lindell Spar, MD.  The CCM team was consulted for assistance with disease management and care coordination needs.    Engaged with patient face to face for initial visit in response to provider referral for pharmacy case management and/or care coordination services.   Consent to Services:  The patient was given the following information about Chronic Care Management services today, agreed to services, and gave verbal consent: 1. CCM service includes personalized support from designated clinical staff supervised by the primary care  provider, including individualized plan of care and coordination with other care providers 2. 24/7 contact phone numbers for assistance for urgent and routine care needs. 3. Service will only be billed when office clinical staff spend 20 minutes or more in a month to coordinate care. 4. Only one practitioner may furnish and bill the service in a calendar month. 5.The patient may stop CCM services at any time (effective at the end of the month) by phone call to the office staff. 6. The patient will be responsible for cost sharing (co-pay) of up to 20% of the service fee (after annual deductible is met). Patient agreed to services and consent obtained.  Patient Care Team: Lindell Spar, MD as PCP - General (Internal Medicine) Fay Records, MD as PCP - Cardiology (Cardiology) Gala Romney Cristopher Estimable, MD as Consulting Physician (Gastroenterology) Beryle Lathe, [facility] (Pharmacist)  Objective:  Lab Results  Component Value Date   CREATININE 0.85 03/19/2021   CREATININE 0.90 02/23/2021   CREATININE 0.98 09/01/2020    Lab Results  Component Value Date   HGBA1C 12.4 (A) 09/19/2021   Last diabetic Eye exam:  Lab Results  Component Value Date/Time   HMDIABEYEEXA No Retinopathy 06/11/2021 02:22 PM    Last diabetic Foot exam: No results found for: HMDIABFOOTEX      Component Value Date/Time   CHOL 208 (H) 01/21/2020 1009   TRIG 208 (H) 01/21/2020 1009   HDL 40 (L) 01/21/2020 1009   CHOLHDL 5.2 (H) 01/21/2020 1009   LDLCALC 133 (H) 01/21/2020 1009    Hepatic Function Latest Ref Rng & Units 03/19/2021 07/31/2020 07/12/2020  Total Protein 6.1 - 8.1 g/dL 6.4 6.3 5.6(L)  Albumin  3.5 - 5.0 g/dL - - 3.3(L)  AST 10 - 35 U/L _0 ALT 6 - 29 U/L _1 Alk Phosphatase 38 - 126 U/L - - 50  Total Bilirubin 0.2 - 1.2 mg/dL 0.4 0.6 0.5    Lab Results  Component Value Date/Time   TSH 0.93 03/19/2021 02:57 PM   TSH 1.99 12/18/2020 03:32 PM   FREET4 1.0 12/18/2020 03:32 PM   FREET4 1.1  07/31/2020 02:01 PM    CBC Latest Ref Rng & Units 09/01/2020 07/31/2020 07/11/2020  WBC 3.4 - 10.8 x10E3/uL 9.7 8.6 11.0(H)  Hemoglobin 11.1 - 15.9 g/dL 14.8 14.9 13.8  Hematocrit 34.0 - 46.6 % 44.2 45.1(H) 41.2  Platelets 150 - 450 x10E3/uL 279 323 320    Lab Results  Component Value Date/Time   VD25OH 32 01/21/2020 10:09 AM    Clinical ASCVD: No  The 10-year ASCVD risk score (Arnett DK, et al., 2019) is: 7.4%   Values used to calculate the score:     Age: 30 years     Sex: Female     Is Non-Hispanic African American: No     Diabetic: Yes     Tobacco smoker: No     Systolic Blood Pressure: 859 mmHg     Is BP treated: Yes     HDL Cholesterol: 40 mg/dL     Total Cholesterol: 208 mg/dL    Social History   Tobacco Use  Smoking Status Former   Types: Cigarettes   Quit date: 12/30/1982   Years since quitting: 38.8  Smokeless Tobacco Never   BP Readings from Last 3 Encounters:  09/19/21 125/81  06/25/21 133/80  05/30/21 123/78   Pulse Readings from Last 3 Encounters:  09/19/21 (!) 115  06/25/21 86  05/30/21 87   Wt Readings from Last 3 Encounters:  09/19/21 258 lb 1.3 oz (117.1 kg)  06/25/21 268 lb (121.6 kg)  05/30/21 261 lb (118.4 kg)    Assessment: Review of patient past medical history, allergies, medications, health status, including review of consultants reports, laboratory and other test data, was performed as part of comprehensive evaluation and provision of chronic care management services.   SDOH:  (Social Determinants of Health) assessments and interventions performed:    CCM Care Plan  Allergies  Allergen Reactions   Cephalexin Itching, Swelling and Other (See Comments)    Medications Reviewed Today     Reviewed by Beryle Lathe, [facility] (Pharmacist) on 10/10/21 at [place] List Status: <None>   Medication Order Taking? Sig Documenting Provider Last Dose Status Informant  ALPRAZolam (XANAX) 0.5 MG tablet [id number] Yes Take 0.5 mg by mouth  daily as needed. [provider] Taking Active   blood glucose meter kit and supplies KIT [id number]  Inject 1 each into the skin daily. Check blood glucose once a day. Diagnosis code: E 11.9. Doree Albee, MD  Active   buPROPion (WELLBUTRIN SR) 100 MG 12 hr tablet [id number] Yes Take 100 mg by mouth every morning. [provider] Taking Active            Med Note Beryle Lathe   Wed Oct 10, 2021  3:15 PM) Plans to switch to bupropion XL 150 mg by mouth daily soon  chlorthalidone (HYGROTON) 25 MG tablet [id number] Yes TAKE 1 TABLET (25 MG TOTAL) BY MOUTH DAILY. Doree Albee, MD Taking Active   Cholecalciferol (VITAMIN D3) 125 MCG (5000 UT) TABS [id number] Yes Take 2 tablets (  10,000 Units total) by mouth daily. Doree Albee, MD Taking Active   Continuous Blood Gluc Sensor (FREESTYLE LIBRE 2 SENSOR) [place] [id number] No Please use it to check blood glucose 3 times daily before meals and nightly  Patient not taking: Reported on 10/10/2021   Lindell Spar, MD Not Taking Active   Cyanocobalamin (VITAMIN B12) 1000 MCG TBCR [id number] Yes Take 1,000 mcg by mouth daily. [provider] Taking Active   Dulaglutide (TRULICITY) 3 EX/9.3ZJ SOPN [id number] Yes Inject 3 mg as directed once a week. Lindell Spar, MD Taking Active   DULoxetine (CYMBALTA) 60 MG capsule [id number] Yes Take 60 mg by mouth daily. [provider] Taking Active   empagliflozin (JARDIANCE) 10 MG TABS tablet [id number] Yes Take 1 tablet (10 mg total) by mouth daily before breakfast. Lindell Spar, MD Taking Active   glipiZIDE (GLUCOTROL) 10 MG tablet [id number] Yes TAKE 1 TABLET BY MOUTH TWICE DAILY BEFORE A MEAL Ailene Ards, NP Taking Active   glucose blood (ACCU-CHEK GUIDE) test strip [id number]  1 each by Other route in the morning, at noon, and at bedtime. Use as instructed Ailene Ards, NP  Active   L-Methylfolate 15 MG TABS [id number] Yes Take 15 mg by mouth daily at 6 (six) AM.  [provider] Taking Active   losartan (COZAAR) 25 MG tablet [id number] Yes TAKE 1 TABLET BY MOUTH EVERY DAY Gosrani, Nimish C, MD Taking Active   metoprolol tartrate (LOPRESSOR) 50 MG tablet [id number] Yes Take 1 tablet (50 mg total) by mouth 2 (two) times daily. No more refills will be approved by this provider as office is now permanently closed, patient will need to find another primary care provider. Ailene Ards, NP Taking Active   Multiple Vitamins-Minerals (MULTIVITAMIN WOMEN 50+) TABS [id number] Yes Take 1 tablet by mouth daily. [provider] Taking Active Self  Pantoprazole Sodium (PROTONIX PO) [id number] Yes Take 40 mg by mouth daily. [provider] Taking Active   potassium chloride SA (KLOR-CON) 20 MEQ tablet [id number] Yes Take 40 mEq by mouth 2 (two) times daily. [provider] Taking Active Self  pregabalin (LYRICA) 75 MG capsule [id number] Yes Take 75 mg by mouth at bedtime. [provider] Taking Active   Saccharomyces boulardii (PROBIOTIC) 250 MG CAPS [id number] Yes Take 1 capsule by mouth daily. [provider] Taking Active   traZODone (DESYREL) 50 MG tablet [id number] Yes Take 50 mg by mouth at bedtime as needed. [provider] Taking Active             Patient Active Problem List   Diagnosis Date Noted   Diabetic polyneuropathy associated with type 2 diabetes mellitus ([place]) 09/19/2021   Encounter to establish care 09/19/2021   History of hormone therapy 09/19/2021   Bilateral primary osteoarthritis of knee 04/10/2021   Perennial allergic rhinitis 04/06/2021   Allergic contact dermatitis 04/06/2021   Cholinergic urticaria 04/06/2021   Fibromyalgia syndrome 01/12/2021   Elevated C-reactive protein (CRP) 01/12/2021   Diverticulosis of colon without hemorrhage 01/02/2021   Major depressive disorder, single episode, severe ([place]) 07/14/2020   GERD (gastroesophageal reflux disease) 05/11/2020   Type 2  diabetes mellitus ([place]) 03/27/2020   Bipolar depression ([place]) 12/28/2019   Sleep apnea 12/28/2019   Hypokalemia 10/31/2019   Tachycardia 10/31/2019   Hyperlipidemia 10/30/2015   Obesity (BMI 30-39.9) 10/30/2015    Immunization History  Administered Date(s) Administered   Influenza,inj,Quad PF,6+ Mos 11/01/2019   Influenza-Unspecified 10/30/2020   Moderna  Sars-Covid-2 Vaccination 05/13/2020, 06/23/2020, 07/12/2021   Pneumococcal Polysaccharide-23 12/18/2020   Td 12/30/2020   Zoster Recombinat (Shingrix) 12/30/2020, 05/29/2021   Conditions to be addressed/monitored: HLD and DMII  Care Plan : Medication Management  Updates made by Beryle Lathe, [place] since 10/10/2021 12:00 AM     Problem: Diabetes and Hyperlipidemia   Priority: High  Onset Date: 10/10/2021     Long-Range Goal: Disease Progression Prevention   Start Date: 10/10/2021  Expected End Date: 01/08/2022  This Visit's Progress: On track  Priority: High  Note:   Current Barriers:  Unable to achieve control of diabetes and hyperlipidemia Suboptimal therapeutic regimen for hyperlipidemia  Pharmacist Clinical Goal(s):  Patient will achieve control of diabetes and hyperlipidemia as evidenced by improved fasting blood sugar, improved A1c, and improved LDL adhere to plan to optimize therapeutic regimen for hyperlipidemia as evidenced by report of adherence to recommended medication management changes through collaboration with PharmD and provider.   Interventions: 1:1 collaboration with Lindell Spar, MD regarding development and update of comprehensive plan of care as evidenced by provider attestation and co-signature Inter-disciplinary care team collaboration (see longitudinal plan of care) Comprehensive medication review performed; medication list updated in electronic medical record  Type 2 Diabetes - New goal. (Patient to establish care with endocrinology Dr. Loanne Drilling on 10/11/21) Uncontrolled; Most recent  A1c above goal of <7% per ADA guidelines Current medications: dulaglutide (Trulicity) 3 mg subcutaneously weekly, empagliflozin (Jardiance) 10 mg by mouth once daily, and glipizide IR 10 mg by mouth twice daily with meals Intolerances:  GI upset with metformin Taking medications as directed: yes Side effects thought to be attributed to current medication regimen: yes, patient reports some nausea and vomiting likely associated with Trulicity Denies hypoglycemic symptoms (sweaty and shaky). Hypoglycemia prevention: not indicated at this time Current meal patterns:  patient is a vegetarian. Reports that she does not drink soda, juice, or milk. Does not add sugar to her coffee. Eats 3 meals a day. Eats 1-2 pieces of bread per day. Does not eat sweats. Current exercise:  walks some but is generally not very active due to fatigue On a statin: no, but will recommend On aspirin 81 mg daily: no Last microalbumin/creatinine ratio: 5 (12/18/20); on an ACEi/ARB: yes Last eye exam: completed within last year Last foot exam: completed within last year Pneumonia vaccine: up to date Influenza vaccine:  needs this fall Shingles: series complete Current glucose readings: patient checks finger stick blood glucose three time daily. fasting blood glucose: above goal of 80-130 mg/dL per ADA guidelines. post prandial glucose: above goal of <180 mg/dL per ADA guidelines. Patient reports fasting blood glucose range is 220-250, postprandial is low 300s, and bedtime is ~250 ; patient reports this is an improvement from before starting Trulicity and Jardiance. The following education materials were given to the patient: carbohydrate guide, dining out with Diabetes, and Planning healthy meals Continue current medications as above for now. Will follow-up on recommendations from endocrinology and support patient as needed. Given level of hyperglycemia, patient may require insulin therapy. Work on reducing carbohydrate intake   Decrease meal sizes to reduce nausea/vomiting with Trulicity. Will continue to follow. May need to lower the dose of Trulicity if patient unable to tolerate.  Patient unable to get [facility] as this is not covered under her pharmacy benefits. This is only covered under DME supplier as she has Medicare. Patient has a [employer] which is not compatible with [employer] but is compatible with  Dexcom G6. Will facilitate prescription to DME for Dexcom G6.   Hyperlipidemia - New goal.: Uncontrolled. LDL above goal of <70 due to very high risk given diabetes + at least 1 additional major risk factor (hypertension) per 2020 AACE/ACE guidelines. Triglycerides above goal of <150 per 2020 AACE/ACE guidelines. Current medications:  none; patient was previously prescribed atorvastatin but never took it Intolerances: unknown Taking medications as directed: n/a Side effects thought to be attributed to current medication regimen: n/a Recommend addition of at least moderate intensity statin. Will discuss more at future visits  Patient Goals/Self-Care Activities Patient will:  Take medications as prescribed Check blood sugar three times a day at the following times: fasting (at least 8 hours since last food consumption), bedtime, and 1-2 hours after a meal, document, and provide at future appointments Collaborate with provider on medication access solutions Engage in dietary modifications by less frequent dining out and fewer sweetened foods & beverages  Follow Up Plan: Telephone follow up appointment with care management team member scheduled for: 10/11/21      Medication Assistance:  to be determined. Patient application for Trulicity has been started. If endocrinology plans to continue with Trulicity then will plan to submit.  Patient's preferred pharmacy is:  [facility], [place] [address] [place] [address] Phone:  [phone] Fax: [place], [place] - [place] [address] Phone: [phone] Fax: [phone]  [facility] [phone] - [place], [place] AT [place] [address] [place] [address] Phone: [phone] Fax: [phone]  Follow Up:  Patient agrees to Care Plan and Follow-up.  Plan: Telephone follow up appointment with care management team member scheduled for:  10/11/21  Kennon Holter, PharmD Clinical Pharmacist [facility] [phone]

## 2021-10-11 ENCOUNTER — Encounter: Payer: Self-pay | Admitting: Endocrinology

## 2021-10-11 ENCOUNTER — Ambulatory Visit (INDEPENDENT_AMBULATORY_CARE_PROVIDER_SITE_OTHER): Payer: Medicare Other | Admitting: Endocrinology

## 2021-10-11 ENCOUNTER — Ambulatory Visit: Payer: Medicare Other | Admitting: Pharmacist

## 2021-10-11 VITALS — BP 100/64 | HR 101 | Ht 69.0 in | Wt 253.0 lb

## 2021-10-11 DIAGNOSIS — E1165 Type 2 diabetes mellitus with hyperglycemia: Secondary | ICD-10-CM

## 2021-10-11 DIAGNOSIS — E782 Mixed hyperlipidemia: Secondary | ICD-10-CM

## 2021-10-11 LAB — TSH: TSH: 1.67 u[IU]/mL (ref 0.35–5.50)

## 2021-10-11 LAB — T4, FREE: Free T4: 0.82 ng/dL (ref 0.60–1.60)

## 2021-10-11 LAB — POCT GLYCOSYLATED HEMOGLOBIN (HGB A1C): Hemoglobin A1C: 11.3 % — AB (ref 4.0–5.6)

## 2021-10-11 MED ORDER — LANTUS SOLOSTAR 100 UNIT/ML ~~LOC~~ SOPN: 25.0000 [IU] | PEN_INJECTOR | SUBCUTANEOUS | 99 refills | Status: DC

## 2021-10-11 MED ORDER — METFORMIN HCL ER 500 MG PO TB24: 1000.0000 mg | ORAL_TABLET | Freq: Every day | ORAL | 3 refills | Status: DC

## 2021-10-11 NOTE — Chronic Care Management (AMB) (Addendum)
Chronic Care Management Pharmacy Note  10/11/2021 Name:  Sydney Taylor MRN:  [id number] DOB:  [birth year]  Summary:  Endocrinology added Lantus 25 units subcutaneously daily and metformin XR 500 mg by mouth twice daily to current regimen. Will continue Trulicity. Patient assistance application for Trulicity submitted 56/38/75.  Recommend addition of at least moderate intensity statin. Will discuss more at future visits  Subjective: Sydney Taylor is an 55 y.o. year old female who is a primary patient of Lindell Spar, MD.  The CCM team was consulted for assistance with disease management and care coordination needs.    Engaged with patient by telephone for follow up visit in response to provider referral for pharmacy case management and/or care coordination services.   Consent to Services:  The patient was given information about Chronic Care Management services, agreed to services, and gave verbal consent prior to initiation of services.  Please see initial visit note for detailed documentation.   Patient Care Team: Lindell Spar, MD as PCP - General (Internal Medicine) Fay Records, MD as PCP - Cardiology (Cardiology) Gala Romney Cristopher Estimable, MD as Consulting Physician (Gastroenterology) Beryle Lathe, [facility] (Pharmacist)  Objective:  Lab Results  Component Value Date   CREATININE 0.85 03/19/2021   CREATININE 0.90 02/23/2021   CREATININE 0.98 09/01/2020    Lab Results  Component Value Date   HGBA1C 11.3 (A) 10/11/2021   Last diabetic Eye exam:  Lab Results  Component Value Date/Time   HMDIABEYEEXA No Retinopathy 06/11/2021 02:22 PM    Last diabetic Foot exam: No results found for: HMDIABFOOTEX      Component Value Date/Time   CHOL 208 (H) 01/21/2020 1009   TRIG 208 (H) 01/21/2020 1009   HDL 40 (L) 01/21/2020 1009   CHOLHDL 5.2 (H) 01/21/2020 1009   LDLCALC 133 (H) 01/21/2020 1009    Hepatic Function Latest Ref Rng & Units 03/19/2021 07/31/2020 07/12/2020   Total Protein 6.1 - 8.1 g/dL 6.4 6.3 5.6(L)  Albumin 3.5 - 5.0 g/dL - - 3.3(L)  AST 10 - 35 U/L _0 ALT 6 - 29 U/L _1 Alk Phosphatase 38 - 126 U/L - - 50  Total Bilirubin 0.2 - 1.2 mg/dL 0.4 0.6 0.5    Lab Results  Component Value Date/Time   TSH 1.67 10/11/2021 02:28 PM   TSH 0.93 03/19/2021 02:57 PM   FREET4 0.82 10/11/2021 02:28 PM   FREET4 1.0 12/18/2020 03:32 PM    CBC Latest Ref Rng & Units 09/01/2020 07/31/2020 07/11/2020  WBC 3.4 - 10.8 x10E3/uL 9.7 8.6 11.0(H)  Hemoglobin 11.1 - 15.9 g/dL 14.8 14.9 13.8  Hematocrit 34.0 - 46.6 % 44.2 45.1(H) 41.2  Platelets 150 - 450 x10E3/uL 279 323 320    Lab Results  Component Value Date/Time   VD25OH 32 01/21/2020 10:09 AM    Clinical ASCVD: No  The 10-year ASCVD risk score (Arnett DK, et al., 2019) is: 4.8%   Values used to calculate the score:     Age: 4 years     Sex: Female     Is Non-Hispanic African American: No     Diabetic: Yes     Tobacco smoker: No     Systolic Blood Pressure: 643 mmHg     Is BP treated: Yes     HDL Cholesterol: 40 mg/dL     Total Cholesterol: 208 mg/dL     Social History   Tobacco Use  Smoking Status Former   Types:  Cigarettes   Quit date: 12/30/1982   Years since quitting: 38.8  Smokeless Tobacco Never   BP Readings from Last 3 Encounters:  10/11/21 100/64  09/19/21 125/81  06/25/21 133/80   Pulse Readings from Last 3 Encounters:  10/11/21 (!) 101  09/19/21 (!) 115  06/25/21 86   Wt Readings from Last 3 Encounters:  10/11/21 253 lb (114.8 kg)  09/19/21 258 lb 1.3 oz (117.1 kg)  06/25/21 268 lb (121.6 kg)    Assessment: Review of patient past medical history, allergies, medications, health status, including review of consultants reports, laboratory and other test data, was performed as part of comprehensive evaluation and provision of chronic care management services.   SDOH:  (Social Determinants of Health) assessments and interventions performed:    CCM Care  Plan  Allergies  Allergen Reactions   Cephalexin Itching, Swelling and Other (See Comments)    Medications Reviewed Today     Reviewed by Beryle Lathe, [facility] (Pharmacist) on 10/11/21 at 1656  Med List Status: <None>   Medication Order Taking? Sig Documenting Provider Last Dose Status Informant  ALPRAZolam (XANAX) 0.5 MG tablet [id number] Yes Take 0.5 mg by mouth daily as needed. [provider] Taking Active   blood glucose meter kit and supplies KIT [id number]  Inject 1 each into the skin daily. Check blood glucose once a day. Diagnosis code: E 11.9. Doree Albee, MD  Active   buPROPion (WELLBUTRIN SR) 100 MG 12 hr tablet [id number] Yes Take 100 mg by mouth every morning. [provider] Taking Active            Med Note Beryle Lathe   Wed Oct 10, 2021  3:15 PM) Plans to switch to bupropion XL 150 mg by mouth daily soon  chlorthalidone (HYGROTON) 25 MG tablet [id number] Yes TAKE 1 TABLET (25 MG TOTAL) BY MOUTH DAILY. Doree Albee, MD Taking Active   Cholecalciferol (VITAMIN D3) 125 MCG (5000 UT) TABS [id number] Yes Take 2 tablets (10,000 Units total) by mouth daily. Doree Albee, MD Taking Active   Continuous Blood Gluc Sensor (FREESTYLE LIBRE 2 SENSOR) [place] [id number] No Please use it to check blood glucose 3 times daily before meals and nightly  Patient not taking: Reported on 10/11/2021   Lindell Spar, MD Not Taking Active   Cyanocobalamin (VITAMIN B12) 1000 MCG TBCR [id number] Yes Take 1,000 mcg by mouth daily. [provider] Taking Active   Dulaglutide (TRULICITY) 3 EQ/6.8TM SOPN [id number] Yes Inject 3 mg as directed once a week. Lindell Spar, MD Taking Active   DULoxetine (CYMBALTA) 60 MG capsule [id number] Yes Take 60 mg by mouth daily. [provider] Taking Active   empagliflozin (JARDIANCE) 10 MG TABS tablet [id number] Yes Take 1 tablet (10 mg total) by mouth daily before breakfast. Lindell Spar, MD Taking  Active   glipiZIDE (GLUCOTROL) 10 MG tablet [id number] Yes TAKE 1 TABLET BY MOUTH TWICE DAILY BEFORE A MEAL Ailene Ards, NP Taking Active   glucose blood (ACCU-CHEK GUIDE) test strip [id number]  1 each by Other route in the morning, at noon, and at bedtime. Use as instructed Ailene Ards, NP  Active   insulin glargine (LANTUS SOLOSTAR) 100 UNIT/ML Solostar Pen [id number]  Inject 25 Units into the skin every morning. And pen needles 1/day Renato Shin, MD  Active   L-Methylfolate 15 MG TABS [id number] Yes Take 15 mg by mouth daily at 6 (six) AM. [provider] Taking Active  losartan (COZAAR) 25 MG tablet [id number] Yes TAKE 1 TABLET BY MOUTH EVERY DAY Gosrani, Nimish C, MD Taking Active   metFORMIN (GLUCOPHAGE-XR) 500 MG 24 hr tablet [id number]  Take 2 tablets (1,000 mg total) by mouth daily with breakfast. Renato Shin, MD  Active   metoprolol tartrate (LOPRESSOR) 50 MG tablet [id number] Yes Take 1 tablet (50 mg total) by mouth 2 (two) times daily. No more refills will be approved by this provider as office is now permanently closed, patient will need to find another primary care provider. Ailene Ards, NP Taking Active   Multiple Vitamins-Minerals (MULTIVITAMIN WOMEN 50+) TABS [id number] Yes Take 1 tablet by mouth daily. [provider] Taking Active Self  Pantoprazole Sodium (PROTONIX PO) [id number] Yes Take 40 mg by mouth daily. [provider] Taking Active   potassium chloride SA (KLOR-CON) 20 MEQ tablet [id number] Yes Take 40 mEq by mouth 2 (two) times daily. [provider] Taking Active Self  pregabalin (LYRICA) 75 MG capsule [id number] Yes Take 75 mg by mouth at bedtime. [provider] Taking Active   Saccharomyces boulardii (PROBIOTIC) 250 MG CAPS [id number] Yes Take 1 capsule by mouth daily. [provider] Taking Active   traZODone (DESYREL) 50 MG tablet [id number] Yes Take 50 mg by mouth at bedtime as needed. [provider] Taking  Active             Patient Active Problem List   Diagnosis Date Noted   Diabetic polyneuropathy associated with type 2 diabetes mellitus ([place]) 09/19/2021   Encounter to establish care 09/19/2021   History of hormone therapy 09/19/2021   Bilateral primary osteoarthritis of knee 04/10/2021   Perennial allergic rhinitis 04/06/2021   Allergic contact dermatitis 04/06/2021   Cholinergic urticaria 04/06/2021   Fibromyalgia syndrome 01/12/2021   Elevated C-reactive protein (CRP) 01/12/2021   Diverticulosis of colon without hemorrhage 01/02/2021   Major depressive disorder, single episode, severe ([place]) 07/14/2020   GERD (gastroesophageal reflux disease) 05/11/2020   Type 2 diabetes mellitus ([place]) 03/27/2020   Bipolar depression ([place]) 12/28/2019   Sleep apnea 12/28/2019   Hypokalemia 10/31/2019   Tachycardia 10/31/2019   Hyperlipidemia 10/30/2015   Obesity (BMI 30-39.9) 10/30/2015    Immunization History  Administered Date(s) Administered   Influenza,inj,Quad PF,6+ Mos 11/01/2019   Influenza-Unspecified 10/30/2020   Moderna Sars-Covid-2 Vaccination 05/13/2020, 06/23/2020, 07/12/2021   Pneumococcal Polysaccharide-23 12/18/2020   Td 12/30/2020   Zoster Recombinat (Shingrix) 12/30/2020, 05/29/2021    Conditions to be addressed/monitored: HLD and DMII  Care Plan : Medication Management  Updates made by Beryle Lathe, [place] since 10/11/2021 12:00 AM     Problem: Diabetes and Hyperlipidemia   Priority: High  Onset Date: 10/10/2021     Long-Range Goal: Disease Progression Prevention   Start Date: 10/10/2021  Expected End Date: 01/08/2022  Recent Progress: On track  Priority: High  Note:   Current Barriers:  Unable to achieve control of diabetes and hyperlipidemia Suboptimal therapeutic regimen for hyperlipidemia  Pharmacist Clinical Goal(s):  Patient will achieve control of diabetes and hyperlipidemia as evidenced by improved fasting blood sugar, improved A1c,  and improved LDL adhere to plan to optimize therapeutic regimen for hyperlipidemia as evidenced by report of adherence to recommended medication management changes through collaboration with PharmD and provider.   Interventions: 1:1 collaboration with Lindell Spar, MD regarding development and update of comprehensive plan of care as evidenced by provider attestation and co-signature Inter-disciplinary care team collaboration (see longitudinal plan of care) Comprehensive medication review  performed; medication list updated in electronic medical record  Type 2 Diabetes - New goal. (Followed by Dr. Loanne Drilling since 10/11/21) Uncontrolled; Most recent A1c above goal of <7% per ADA guidelines Current medications: dulaglutide (Trulicity) 3 mg subcutaneously weekly, empagliflozin (Jardiance) 10 mg by mouth once daily, and glipizide IR 10 mg by mouth twice daily with meals Endocrinology recommended starting metformin XR 500 mg by mouth twice daily and Lantus 25 units subcutaneously once daily on 10/11/21 Intolerances:  GI upset with metformin Taking medications as directed: yes Side effects thought to be attributed to current medication regimen: yes, patient reports some nausea and vomiting likely associated with Trulicity Denies hypoglycemic symptoms (sweaty and shaky). Hypoglycemia prevention: not indicated at this time Current meal patterns:  patient is a vegetarian. Reports that she does not drink soda, juice, or milk. Does not add sugar to her coffee. Eats 3 meals a day. Eats 1-2 pieces of bread per day. Does not eat sweats. Current exercise:  walks some but is generally not very active due to fatigue On a statin: no, but will recommend On aspirin 81 mg daily: no Last microalbumin/creatinine ratio: 5 (12/18/20); on an ACEi/ARB: yes Last eye exam: completed within last year Last foot exam: completed within last year Pneumonia vaccine: up to date Influenza vaccine:  needs this fall Shingles:  series complete Current glucose readings: patient checks finger stick blood glucose three time daily. fasting blood glucose: above goal of 80-130 mg/dL per ADA guidelines. post prandial glucose: above goal of <180 mg/dL per ADA guidelines. Patient reports fasting blood glucose range is 220-250, postprandial is low 300s, and bedtime is ~250 ; patient reports this is an improvement from before starting Trulicity and Jardiance. The following education materials were given to the patient: carbohydrate guide, dining out with Diabetes, and Planning healthy meals Continue above medications per endocrinology Work on reducing carbohydrate intake  Decrease meal sizes to reduce nausea/vomiting with Trulicity. Will continue to follow. May need to lower the dose of Trulicity if patient unable to tolerate.  Patient unable to get [facility] as this is not covered under her pharmacy benefits. This is only covered under DME supplier as she has Medicare. Patient has a [employer] which is not compatible with [employer] but is compatible with Dexcom G6. Will facilitate prescription to DME for Dexcom G6.  Patient assistance application for Trulicity submitted 73/71/06  Hyperlipidemia - New goal.: Uncontrolled. LDL above goal of <70 due to very high risk given diabetes + at least 1 additional major risk factor (hypertension) per 2020 AACE/ACE guidelines. Triglycerides above goal of <150 per 2020 AACE/ACE guidelines. Current medications:  none; patient was previously prescribed atorvastatin but never took it Intolerances: unknown Taking medications as directed: n/a Side effects thought to be attributed to current medication regimen: n/a Recommend addition of at least moderate intensity statin. Will discuss more at future visits  Patient Goals/Self-Care Activities Patient will:  Take medications as prescribed Check blood sugar three times a day at the following times: fasting (at least 8 hours since  last food consumption), bedtime, and 1-2 hours after a meal, document, and provide at future appointments Collaborate with provider on medication access solutions Engage in dietary modifications by less frequent dining out and fewer sweetened foods & beverages  Follow Up Plan: Telephone follow up appointment with care management team member scheduled for: 11/01/21      Medication Assistance:  [employer] application for Trulicity pending  Patient's preferred pharmacy is:  [id number] [place]  [place], [place] [address] [place] [address] Phone: [phone] Fax: [place], [place] - [place] [address] Phone: [phone] Fax: [phone]  [facility] [phone] - [place], [place] AT [place] [address] [place] [address] Phone: [phone] Fax: [phone]  Follow Up:  Patient agrees to Care Plan and Follow-up.  Plan: Telephone follow up appointment with care management team member scheduled for:  11/01/21  Kennon Holter, PharmD Clinical Pharmacist [facility] [phone]

## 2021-10-11 NOTE — Patient Instructions (Signed)
Sydney Taylor,  It was great to talk to you today!  Please call me with any questions or concerns.   Visit Information  PATIENT GOALS:  Goals Addressed             This Visit's Progress    Medication Management        Patient Goals/Self-Care Activities Patient will:  Take medications as prescribed Check blood sugar three times a day at the following times: fasting (at least 8 hours since last food consumption), bedtime, and 1-2 hours after a meal, document, and provide at future appointments Collaborate with provider on medication access solutions Engage in dietary modifications by less frequent dining out and fewer sweetened foods & beverages         Patient verbalizes understanding of instructions provided today and agrees to view in [place].   Telephone follow up appointment with care management team member scheduled for:11/01/21  Kennon Holter, PharmD Clinical Pharmacist [facility] [phone]

## 2021-10-11 NOTE — Patient Instructions (Addendum)
good diet and exercise significantly improve the control of your diabetes.  please let me know if you wish to be referred to a dietician.  high blood sugar is very risky to your health.  you should see an eye doctor and dentist every year.  It is very important to get all recommended vaccinations.  Controlling your blood pressure and cholesterol drastically reduces the damage diabetes does to your body.  Those who smoke should quit.  Please discuss these with your doctor.  check your blood sugar twice a day.  vary the time of day when you check, between before the 3 meals, and at bedtime.  also check if you have symptoms of your blood sugar being too high or too low.  please keep a record of the readings and bring it to your next appointment here (or you can bring the meter itself).  You can write it on any piece of paper.  please call us sooner if your blood sugar goes below 70, or if most of your readings are over 200.   I have sent 2 prescription to your pharmacy: to add metformin-XR, and to add Lantus.  Please continue the same other medications.   Blood tests are requested for you today.  We'll let you know about the results.  Please come back for a follow-up appointment in 2 months.

## 2021-10-11 NOTE — Progress Notes (Signed)
Subjective:    Patient ID: Sydney Taylor, female    DOB: [birth year], 55 y.o.   MRN: [id number]  HPI pt is referred by Dr Posey Pronto, for diabetes.  Pt states DM was dx'ed in 6629; it is complicated by PN.  she has never been on insulin.  pt says her diet is good, but exercise is poor; she has never had GDM, pancreatitis, pancreatic surgery, severe hypoglycemia or DKA.  She takes Trulicity and 2 oral meds.  She says cbg varies from 220-300.  She recently started [place].  She did not tolerate metformin (abd pain).  She cannot pay brand name copay.  She gets Trulicity for free, from [employer].  She has been off thyroid medication x 3 weeks.   Past Medical History:  Diagnosis Date   Anemia    Anxiety    Arrhythmia    Bipolar depression ([place]) 12/28/2019   Chronic fatigue    Chronic headaches    Depression    Diabetes mellitus without complication (HCC)    Fibromyalgia    GERD (gastroesophageal reflux disease)    High cholesterol    HLD (hyperlipidemia)    Hypertension    Insect bite    Migraine    Prediabetes    Sleep apnea    Urinary incontinence     Past Surgical History:  Procedure Laterality Date   ESOPHAGOGASTRODUODENOSCOPY N/A 05/12/2020   Procedure: ESOPHAGOGASTRODUODENOSCOPY (EGD);  Surgeon: Daneil Dolin, MD;  [facility];  Service: Endoscopy;  Laterality: N/A;  11:15am   Joint fusion on foot Right    MALONEY DILATION N/A 05/12/2020   Procedure: MALONEY DILATION;  Surgeon: Daneil Dolin, MD;  [facility]: [facility];  Service: Endoscopy;  Laterality: N/A;   NASAL SEPTUM SURGERY     SINOSCOPY      Social History   Socioeconomic History   Marital status: Married    Spouse name: Not on file   Number of children: 0   Years of education: Not on file   Highest education level: Bachelor's degree (e.g., BA, AB, BS)  Occupational History   Occupation: research associate/drug development  Tobacco Use   Smoking status: Former    Types: Cigarettes    Quit date:  12/30/1982    Years since quitting: 38.8   Smokeless tobacco: Never  Vaping Use   Vaping Use: Never used  Substance and Sexual Activity   Alcohol use: Not Currently    Alcohol/week: 0.0 standard drinks   Drug use: Never   Sexual activity: Yes    Birth control/protection: Post-menopausal  Other Topics Concern   Not on file  Social History Narrative   Married for 3 years.Lives with husband.Bachelors in Education officer, museum.   Right handed   Caffeine: 1-2 per day    Social Determinants of Health   Financial Resource Strain: Not on file  Food Insecurity: Not on file  Transportation Needs: Not on file  Physical Activity: Not on file  Stress: Not on file  Social Connections: Not on file  Intimate Partner Violence: Not on file    Current Outpatient Medications on File Prior to Visit  Medication Sig Dispense Refill   ALPRAZolam (XANAX) 0.5 MG tablet Take 0.5 mg by mouth daily as needed.     blood glucose meter kit and supplies KIT Inject 1 each into the skin daily. Check blood glucose once a day. Diagnosis code: E 11.9. 1 each 0   buPROPion (WELLBUTRIN SR) 100 MG 12 hr tablet Take 100 mg by  mouth every morning.     chlorthalidone (HYGROTON) 25 MG tablet TAKE 1 TABLET (25 MG TOTAL) BY MOUTH DAILY. 90 tablet 0   Cholecalciferol (VITAMIN D3) 125 MCG (5000 UT) TABS Take 2 tablets (10,000 Units total) by mouth daily. 180 tablet 1   Continuous Blood Gluc Sensor (FREESTYLE LIBRE 2 SENSOR) MISC Please use it to check blood glucose 3 times daily before meals and nightly (Patient not taking: Reported on 10/11/2021) 2 each 2   Cyanocobalamin (VITAMIN B12) 1000 MCG TBCR Take 1,000 mcg by mouth daily.     Dulaglutide (TRULICITY) 3 VC/9.4WH SOPN Inject 3 mg as directed once a week. 2 mL 2   DULoxetine (CYMBALTA) 60 MG capsule Take 60 mg by mouth daily.     empagliflozin (JARDIANCE) 10 MG TABS tablet Take 1 tablet (10 mg total) by mouth daily before breakfast. 30 tablet 2   glipiZIDE (GLUCOTROL) 10 MG tablet TAKE  1 TABLET BY MOUTH TWICE DAILY BEFORE A MEAL 180 tablet 0   glucose blood (ACCU-CHEK GUIDE) test strip 1 each by Other route in the morning, at noon, and at bedtime. Use as instructed 300 each 3   L-Methylfolate 15 MG TABS Take 15 mg by mouth daily at 6 (six) AM.     losartan (COZAAR) 25 MG tablet TAKE 1 TABLET BY MOUTH EVERY DAY 90 tablet 0   metoprolol tartrate (LOPRESSOR) 50 MG tablet Take 1 tablet (50 mg total) by mouth 2 (two) times daily. No more refills will be approved by this provider as office is now permanently closed, patient will need to find another primary care provider. 180 tablet 0   Multiple Vitamins-Minerals (MULTIVITAMIN WOMEN 50+) TABS Take 1 tablet by mouth daily.     Pantoprazole Sodium (PROTONIX PO) Take 40 mg by mouth daily.     potassium chloride SA (KLOR-CON) 20 MEQ tablet Take 40 mEq by mouth 2 (two) times daily.     pregabalin (LYRICA) 75 MG capsule Take 75 mg by mouth at bedtime.     Saccharomyces boulardii (PROBIOTIC) 250 MG CAPS Take 1 capsule by mouth daily.     traZODone (DESYREL) 50 MG tablet Take 50 mg by mouth at bedtime as needed.     No current facility-administered medications on file prior to visit.    Allergies  Allergen Reactions   Cephalexin Itching, Swelling and Other (See Comments)    Family History  Problem Relation Age of Onset   Hypertension Father    Hyperlipidemia Father    Diabetes Father    Stroke Father    Depression Father    Anxiety disorder Father    Hypertension Mother    Hyperlipidemia Mother    Diabetes Mellitus II Mother    Congestive Heart Failure Mother    Heart attack Mother    Hypertension Sister    Diabetes Sister    Hypertension Brother    Autoimmune disease Brother    Diabetes Maternal Grandmother    Lupus Paternal Grandmother    Depression Paternal Grandmother    Anxiety disorder Paternal Grandmother    Depression Maternal Aunt    Anxiety disorder Maternal Aunt    Colon cancer Neg Hx    Esophageal cancer  Neg Hx    Gastric cancer Neg Hx     BP 100/64 (BP Location: Right Arm, Patient Position: Sitting, Cuff Size: Large)   Pulse (!) 101   Ht 5' 9"  (1.753 m)   Wt 253 lb (114.8 kg)   LMP  (LMP  Unknown)   SpO2 96%   BMI 37.36 kg/m    Review of Systems denies sob.  She has lost weight, since on trulicity.  She seldom has N/V.  HB is worse on Trulicity.      Objective:   Physical Exam Pulses: dorsalis pedis intact bilat.   MSK: no deformity of the feet CV: no leg edema Skin:  no ulcer on the feet.  normal color and temp on the feet. Neuro: sensation is intact to touch on the feet.     Lab Results  Component Value Date   CREATININE 0.85 03/19/2021   BUN 15 03/19/2021   NA 138 03/19/2021   K 4.1 03/19/2021   CL 102 03/19/2021   CO2 22 03/19/2021    Lab Results  Component Value Date   HGBA1C 11.3 (A) 10/11/2021   I have reviewed outside records, and summarized: Pt was noted to have elevated A1c, and referred here.  Depression and thyroid medication were also addressed     Assessment & Plan:  type 2 DM: severe exacerbation.  She chooses qd insulin, at least for now.    Patient Instructions  good diet and exercise significantly improve the control of your diabetes.  please let me know if you wish to be referred to a dietician.  high blood sugar is very risky to your health.  you should see an eye doctor and dentist every year.  It is very important to get all recommended vaccinations.  Controlling your blood pressure and cholesterol drastically reduces the damage diabetes does to your body.  Those who smoke should quit.  Please discuss these with your doctor.  check your blood sugar twice a day.  vary the time of day when you check, between before the 3 meals, and at bedtime.  also check if you have symptoms of your blood sugar being too high or too low.  please keep a record of the readings and bring it to your next appointment here (or you can bring the meter itself).  You can  write it on any piece of paper.  please call us sooner if your blood sugar goes below 70, or if most of your readings are over 200.   I have sent 2 prescription to your pharmacy: to add metformin-XR, and to add Lantus.  Please continue the same other medications.   Blood tests are requested for you today.  We'll let you know about the results.  Please come back for a follow-up appointment in 2 months.

## 2021-10-12 NOTE — Progress Notes (Signed)
Scheduled with Harold Hedge on 11/08/2021  [facility]  Direct Dial: [phone]

## 2021-10-15 ENCOUNTER — Encounter: Payer: Self-pay | Admitting: Internal Medicine

## 2021-10-15 MED ORDER — LOSARTAN POTASSIUM 25 MG PO TABS: 25.0000 mg | ORAL_TABLET | Freq: Every day | ORAL | 0 refills | Status: DC

## 2021-10-18 ENCOUNTER — Encounter: Payer: Self-pay | Admitting: Endocrinology

## 2021-10-18 ENCOUNTER — Other Ambulatory Visit: Payer: Self-pay

## 2021-10-18 DIAGNOSIS — E1165 Type 2 diabetes mellitus with hyperglycemia: Secondary | ICD-10-CM

## 2021-10-18 MED ORDER — PEN NEEDLES 32G X 4 MM MISC: 5 refills | Status: DC

## 2021-10-19 ENCOUNTER — Encounter: Payer: Self-pay | Admitting: Endocrinology

## 2021-10-19 ENCOUNTER — Encounter: Payer: Self-pay | Admitting: Internal Medicine

## 2021-10-19 ENCOUNTER — Other Ambulatory Visit: Payer: Self-pay

## 2021-10-19 DIAGNOSIS — E1165 Type 2 diabetes mellitus with hyperglycemia: Secondary | ICD-10-CM

## 2021-10-19 MED ORDER — PEN NEEDLES 32G X 4 MM MISC: 5 refills | Status: DC

## 2021-10-19 NOTE — Telephone Encounter (Signed)
I have talked with pt and told her to go to [facility] if she needs care today.  If she wants, I can get her into [facility] today or Monday for xray if she wants.

## 2021-10-21 ENCOUNTER — Encounter: Payer: Self-pay | Admitting: Internal Medicine

## 2021-10-22 ENCOUNTER — Other Ambulatory Visit: Payer: Self-pay

## 2021-10-22 DIAGNOSIS — E1165 Type 2 diabetes mellitus with hyperglycemia: Secondary | ICD-10-CM

## 2021-10-22 MED ORDER — PEN NEEDLES 32G X 4 MM MISC: 5 refills | Status: DC

## 2021-10-24 ENCOUNTER — Ambulatory Visit (INDEPENDENT_AMBULATORY_CARE_PROVIDER_SITE_OTHER): Payer: Medicare Other | Admitting: Internal Medicine

## 2021-10-24 ENCOUNTER — Other Ambulatory Visit: Payer: Self-pay

## 2021-10-24 ENCOUNTER — Encounter: Payer: Self-pay | Admitting: Internal Medicine

## 2021-10-24 DIAGNOSIS — R682 Dry mouth, unspecified: Secondary | ICD-10-CM | POA: Diagnosis not present

## 2021-10-24 DIAGNOSIS — E1165 Type 2 diabetes mellitus with hyperglycemia: Secondary | ICD-10-CM

## 2021-10-24 NOTE — Patient Instructions (Signed)
Please start taking chlorthalidone half tablet daily for next 1 week and contact us with BP readings.  Continue to take at least 64 ounces of fluid in a day.

## 2021-10-24 NOTE — Progress Notes (Signed)
Virtual Visit via Telephone Note   This visit type was conducted due to national recommendations for restrictions regarding the COVID-19 Pandemic (e.g. social distancing) in an effort to limit this patient's exposure and mitigate transmission in our community.  Due to her co-morbid illnesses, this patient is at least at moderate risk for complications without adequate follow up.  This format is felt to be most appropriate for this patient at this time.  The patient did not have access to video technology/had technical difficulties with video requiring transitioning to audio format only (telephone).  All issues noted in this document were discussed and addressed.  No physical exam could be performed with this format.  Please refer to the patient's chart for her  consent to telehealth for [facility].   Evaluation Performed:  Follow-up visit  Date:  10/24/2021   ID:  Sydney Taylor, DOB [birth year], MRN [id number]  Patient Location: Home Provider Location: Office/Clinic  Location of Patient: Home Location of Provider: Telehealth Consent was obtain for visit to be over via telehealth. I verified that I am speaking with the correct person using two identifiers.  PCP:  Lindell Spar, MD   Chief Complaint: Dry mouth  History of Present Illness:    Sydney Taylor is a 55 y.o. female who has a televisit for complaint of dry mouth since starting Jardiance.  Of note, she also takes chlorthalidone and losartan for HTN.  She has tried increasing fluid intake and has used sugar-free gums to help with dry mouth, which helps for few minutes.  She denies any dental pain or caries currently.  The patient does not have symptoms concerning for COVID-19 infection (fever, chills, cough, or new shortness of breath).   Past Medical, Surgical, Social History, Allergies, and Medications have been Reviewed.  Past Medical History:  Diagnosis Date   Anemia    Anxiety    Arrhythmia    Bipolar  depression ([place]) 12/28/2019   Chronic fatigue    Chronic headaches    Depression    Diabetes mellitus without complication (HCC)    Fibromyalgia    GERD (gastroesophageal reflux disease)    High cholesterol    HLD (hyperlipidemia)    Hypertension    Insect bite    Migraine    Prediabetes    Sleep apnea    Urinary incontinence    Past Surgical History:  Procedure Laterality Date   ESOPHAGOGASTRODUODENOSCOPY N/A 05/12/2020   Procedure: ESOPHAGOGASTRODUODENOSCOPY (EGD);  Surgeon: Daneil Dolin, MD;  [facility];  Service: Endoscopy;  Laterality: N/A;  11:15am   Joint fusion on foot Right    MALONEY DILATION N/A 05/12/2020   Procedure: MALONEY DILATION;  Surgeon: Daneil Dolin, MD;  [facility];  Service: Endoscopy;  Laterality: N/A;   NASAL SEPTUM SURGERY     SINOSCOPY       Current Meds  Medication Sig   ALPRAZolam (XANAX) 0.5 MG tablet Take 0.5 mg by mouth daily as needed.   blood glucose meter kit and supplies KIT Inject 1 each into the skin daily. Check blood glucose once a day. Diagnosis code: E 11.9.   buPROPion (WELLBUTRIN SR) 100 MG 12 hr tablet Take 100 mg by mouth every morning.   chlorthalidone (HYGROTON) 25 MG tablet TAKE 1 TABLET (25 MG TOTAL) BY MOUTH DAILY. (Patient taking differently: Take 12.5 mg by mouth daily.)   Cholecalciferol (VITAMIN D3) 125 MCG (5000 UT) TABS Take 2 tablets (10,000 Units total) by  mouth daily.   Cyanocobalamin (VITAMIN B12) 1000 MCG TBCR Take 1,000 mcg by mouth daily.   Dulaglutide (TRULICITY) 3 FW/2.6VZ SOPN Inject 3 mg as directed once a week.   DULoxetine (CYMBALTA) 60 MG capsule Take 60 mg by mouth daily.   empagliflozin (JARDIANCE) 10 MG TABS tablet Take 1 tablet (10 mg total) by mouth daily before breakfast.   glipiZIDE (GLUCOTROL) 10 MG tablet TAKE 1 TABLET BY MOUTH TWICE DAILY BEFORE A MEAL   glucose blood (ACCU-CHEK GUIDE) test strip 1 each by Other route in the morning, at noon, and at bedtime. Use as  instructed   insulin glargine (LANTUS SOLOSTAR) 100 UNIT/ML Solostar Pen Inject 25 Units into the skin every morning. And pen needles 1/day   Insulin Pen Needle (PEN NEEDLES) 32G X 4 MM MISC Check BS 2 x a day   L-Methylfolate 15 MG TABS Take 15 mg by mouth daily at 6 (six) AM.   losartan (COZAAR) 25 MG tablet Take 1 tablet (25 mg total) by mouth daily.   metFORMIN (GLUCOPHAGE-XR) 500 MG 24 hr tablet Take 2 tablets (1,000 mg total) by mouth daily with breakfast.   metoprolol tartrate (LOPRESSOR) 50 MG tablet Take 1 tablet (50 mg total) by mouth 2 (two) times daily. No more refills will be approved by this provider as office is now permanently closed, patient will need to find another primary care provider.   Multiple Vitamins-Minerals (MULTIVITAMIN WOMEN 50+) TABS Take 1 tablet by mouth daily.   Pantoprazole Sodium (PROTONIX PO) Take 40 mg by mouth daily.   potassium chloride SA (KLOR-CON) 20 MEQ tablet Take 40 mEq by mouth 2 (two) times daily.   pregabalin (LYRICA) 75 MG capsule Take 75 mg by mouth at bedtime.   Saccharomyces boulardii (PROBIOTIC) 250 MG CAPS Take 1 capsule by mouth daily.   traZODone (DESYREL) 50 MG tablet Take 50 mg by mouth at bedtime as needed.     Allergies:   Cephalexin   ROS:   Please see the history of present illness.     All other systems reviewed and are negative.   Labs/Other Tests and Data Reviewed:    Recent Labs: 03/19/2021: ALT 16; BUN 15; Creat 0.85; Potassium 4.1; Sodium 138 10/11/2021: TSH 1.67   Recent Lipid Panel Lab Results  Component Value Date/Time   CHOL 208 (H) 01/21/2020 10:09 AM   TRIG 208 (H) 01/21/2020 10:09 AM   HDL 40 (L) 01/21/2020 10:09 AM   CHOLHDL 5.2 (H) 01/21/2020 10:09 AM   LDLCALC 133 (H) 01/21/2020 10:09 AM    Wt Readings from Last 3 Encounters:  10/11/21 253 lb (114.8 kg)  09/19/21 258 lb 1.3 oz (117.1 kg)  06/25/21 268 lb (121.6 kg)    ASSESSMENT & PLAN:    Type 2 diabetes mellitus (HCC)  Lab Results   Component Value Date   HGBA1C 11.3 (A) 10/11/2021   On glipizide and Trulicity, Trulicity refilled today On Jardiance 10 mg QD - has dry mouth with it, advised to continue adequate hydration for now On metformin XR and Lantus 25 U qHS now Followed by endocrinology and clinical pharmacist Advised to follow diabetic diet On ARB, not on statin - plan to start it in the next visit F/u CMP and lipid panel Diabetic eye exam: Advised to follow up with Ophthalmology for diabetic eye exam  Dry mouth Could be a side effect of Jardiance, she also takes chlorthalidone and losartan Will decrease dose of chlorthalidone to 12.5 mg QD for now  Advised for proper hydration Advised to contact with BP readings  Time:   Today, I have spent 13 minutes reviewing the chart, including problem list, medications, and with the patient with telehealth technology discussing the above problems.   Medication Adjustments/Labs and Tests Ordered: Current medicines are reviewed at length with the patient today.  Concerns regarding medicines are outlined above.   Tests Ordered: No orders of the defined types were placed in this encounter.   Medication Changes: No orders of the defined types were placed in this encounter.    Note: This dictation was prepared with Dragon dictation along with smaller phrase technology. Similar sounding words can be transcribed inadequately or may not be corrected upon review. Any transcriptional errors that result from this process are unintentional.      Disposition:  Follow up  Signed, Lindell Spar, MD  10/24/2021 5:02 PM     [facility]

## 2021-10-24 NOTE — Assessment & Plan Note (Addendum)
  Lab Results  Component Value Date   HGBA1C 11.3 (A) 10/11/2021   On glipizide and Trulicity, Trulicity refilled today On Jardiance 10 mg QD - has dry mouth with it, advised to continue adequate hydration for now On metformin XR and Lantus 25 U qHS now Followed by endocrinology and clinical pharmacist Advised to follow diabetic diet On ARB, not on statin - plan to start it in the next visit F/u CMP and lipid panel Diabetic eye exam: Advised to follow up with Ophthalmology for diabetic eye exam

## 2021-10-25 ENCOUNTER — Encounter: Payer: Self-pay | Admitting: Endocrinology

## 2021-10-29 ENCOUNTER — Encounter: Payer: Self-pay | Admitting: Internal Medicine

## 2021-10-29 ENCOUNTER — Other Ambulatory Visit: Payer: Self-pay

## 2021-10-29 ENCOUNTER — Ambulatory Visit: Payer: Medicare Other | Admitting: Internal Medicine

## 2021-10-29 DIAGNOSIS — G43709 Chronic migraine without aura, not intractable, without status migrainosus: Secondary | ICD-10-CM

## 2021-10-29 DIAGNOSIS — R110 Nausea: Secondary | ICD-10-CM

## 2021-10-29 DIAGNOSIS — E1165 Type 2 diabetes mellitus with hyperglycemia: Secondary | ICD-10-CM | POA: Diagnosis not present

## 2021-10-29 DIAGNOSIS — M797 Fibromyalgia: Secondary | ICD-10-CM

## 2021-10-29 DIAGNOSIS — E782 Mixed hyperlipidemia: Secondary | ICD-10-CM | POA: Diagnosis not present

## 2021-10-29 DIAGNOSIS — J329 Chronic sinusitis, unspecified: Secondary | ICD-10-CM

## 2021-10-29 DIAGNOSIS — B9789 Other viral agents as the cause of diseases classified elsewhere: Secondary | ICD-10-CM

## 2021-10-29 DIAGNOSIS — G43109 Migraine with aura, not intractable, without status migrainosus: Secondary | ICD-10-CM | POA: Insufficient documentation

## 2021-10-29 MED ORDER — METOCLOPRAMIDE HCL 5 MG PO TABS: 5.0000 mg | ORAL_TABLET | Freq: Three times a day (TID) | ORAL | 0 refills | Status: DC | PRN

## 2021-10-29 MED ORDER — SUMATRIPTAN SUCCINATE 50 MG PO TABS
ORAL_TABLET | ORAL | 0 refills | Status: DC
Start: 2021-10-29 — End: 2021-11-28

## 2021-10-29 NOTE — Telephone Encounter (Signed)
Appt scheduled

## 2021-10-29 NOTE — Patient Instructions (Signed)
Please take Reglan as needed for nausea.  Take small, frequent meals.  Please take Imitrex for migraine.  Placed discussed with her psychiatrist about Lyrica.

## 2021-10-29 NOTE — Assessment & Plan Note (Signed)
Headache likely related to migraine Started Imitrex as needed

## 2021-10-29 NOTE — Progress Notes (Signed)
Virtual Visit via Telephone Note   This visit type was conducted due to national recommendations for restrictions regarding the COVID-19 Pandemic (e.g. social distancing) in an effort to limit this patient's exposure and mitigate transmission in our community.  Due to her co-morbid illnesses, this patient is at least at moderate risk for complications without adequate follow up.  This format is felt to be most appropriate for this patient at this time.  The patient did not have access to video technology/had technical difficulties with video requiring transitioning to audio format only (telephone).  All issues noted in this document were discussed and addressed.  No physical exam could be performed with this format.  Evaluation Performed:  Follow-up visit  Date:  10/29/2021   ID:  Sydney Taylor, DOB 1966-10-20, MRN [id number]  Patient Location: Home Provider Location: Office/Clinic  Participants: Patient Location of Patient: Home Location of Provider: Telehealth Consent was obtain for visit to be over via telehealth. I verified that I am speaking with the correct person using two identifiers.  PCP:  Lindell Spar, MD   Chief Complaint: Headache, nausea, diffuse pain/myalgia  History of Present Illness:    Sydney Taylor is a 55 y.o. female who has a televisit for complaint of chronic headache, which is intermittent, last for about 4 hours with nausea and phonophobia.  She has history of migraine, but does not take any medicine for it currently.  She also complains of nausea since starting Trulicity, which she has stopped recently.  Denies any vomiting or abdominal cramping currently.  She complains of diffuse myalgias around arms neck and legs.  Of note, she takes Lyrica and Cymbalta and has history of fibromyalgia.  She also takes Wellbutrin and trazodone for depression and insomnia.  She complains of painful bulging behind the ear.  Of note, she has nasal congestion and  mild postnasal drip for the last few days.  The patient does not have symptoms concerning for COVID-19 infection (fever, chills, cough, or new shortness of breath).   Past Medical, Surgical, Social History, Allergies, and Medications have been Reviewed.  Past Medical History:  Diagnosis Date   Anemia    Anxiety    Arrhythmia    Bipolar depression ([place]) 12/28/2019   Chronic fatigue    Chronic headaches    Depression    Diabetes mellitus without complication (HCC)    Fibromyalgia    GERD (gastroesophageal reflux disease)    High cholesterol    HLD (hyperlipidemia)    Hypertension    Insect bite    Migraine    Prediabetes    Sleep apnea    Urinary incontinence    Past Surgical History:  Procedure Laterality Date   ESOPHAGOGASTRODUODENOSCOPY N/A 05/12/2020   Procedure: ESOPHAGOGASTRODUODENOSCOPY (EGD);  Surgeon: Daneil Dolin, MD;  Location: [facility];  Service: Endoscopy;  Laterality: N/A;  11:15am   Joint fusion on foot Right    MALONEY DILATION N/A 05/12/2020   Procedure: MALONEY DILATION;  Surgeon: Daneil Dolin, MD;  Location: [facility];  Service: Endoscopy;  Laterality: N/A;   NASAL SEPTUM SURGERY     SINOSCOPY       Current Meds  Medication Sig   ALPRAZolam (XANAX) 0.5 MG tablet Take 0.5 mg by mouth daily as needed.   blood glucose meter kit and supplies KIT Inject 1 each into the skin daily. Check blood glucose once a day. Diagnosis code: E 11.9.   buPROPion (WELLBUTRIN SR) 100 MG 12  hr tablet Take 100 mg by mouth every morning.   chlorthalidone (HYGROTON) 25 MG tablet TAKE 1 TABLET (25 MG TOTAL) BY MOUTH DAILY. (Patient taking differently: Take 12.5 mg by mouth daily.)   Cholecalciferol (VITAMIN D3) 125 MCG (5000 UT) TABS Take 2 tablets (10,000 Units total) by mouth daily.   Continuous Blood Gluc Sensor (FREESTYLE LIBRE 2 SENSOR) MISC Please use it to check blood glucose 3 times daily before meals and nightly   Cyanocobalamin (VITAMIN B12) 1000 MCG TBCR  Take 1,000 mcg by mouth daily.   DULoxetine (CYMBALTA) 60 MG capsule Take 60 mg by mouth daily.   empagliflozin (JARDIANCE) 10 MG TABS tablet Take 1 tablet (10 mg total) by mouth daily before breakfast.   glipiZIDE (GLUCOTROL) 10 MG tablet TAKE 1 TABLET BY MOUTH TWICE DAILY BEFORE A MEAL   glucose blood (ACCU-CHEK GUIDE) test strip 1 each by Other route in the morning, at noon, and at bedtime. Use as instructed   insulin glargine (LANTUS SOLOSTAR) 100 UNIT/ML Solostar Pen Inject 25 Units into the skin every morning. And pen needles 1/day (Patient taking differently: Inject 35 Units into the skin every morning. And pen needles 1/day)   Insulin Pen Needle (PEN NEEDLES) 32G X 4 MM MISC Check BS 2 x a day   L-Methylfolate 15 MG TABS Take 15 mg by mouth daily at 6 (six) AM.   losartan (COZAAR) 25 MG tablet Take 1 tablet (25 mg total) by mouth daily.   metFORMIN (GLUCOPHAGE-XR) 500 MG 24 hr tablet Take 2 tablets (1,000 mg total) by mouth daily with breakfast.   metoprolol tartrate (LOPRESSOR) 50 MG tablet Take 1 tablet (50 mg total) by mouth 2 (two) times daily. No more refills will be approved by this provider as office is now permanently closed, patient will need to find another primary care provider.   Multiple Vitamins-Minerals (MULTIVITAMIN WOMEN 50+) TABS Take 1 tablet by mouth daily.   Pantoprazole Sodium (PROTONIX PO) Take 40 mg by mouth daily.   potassium chloride SA (KLOR-CON) 20 MEQ tablet Take 40 mEq by mouth 2 (two) times daily.   pregabalin (LYRICA) 75 MG capsule Take 75 mg by mouth at bedtime.   Saccharomyces boulardii (PROBIOTIC) 250 MG CAPS Take 1 capsule by mouth daily.   traZODone (DESYREL) 50 MG tablet Take 50 mg by mouth at bedtime as needed.     Allergies:   Cephalexin   ROS:   Please see the history of present illness.     All other systems reviewed and are negative.   Labs/Other Tests and Data Reviewed:    Recent Labs: 03/19/2021: ALT 16; BUN 15; Creat 0.85; Potassium  4.1; Sodium 138 10/11/2021: TSH 1.67   Recent Lipid Panel Lab Results  Component Value Date/Time   CHOL 208 (H) 01/21/2020 10:09 AM   TRIG 208 (H) 01/21/2020 10:09 AM   HDL 40 (L) 01/21/2020 10:09 AM   CHOLHDL 5.2 (H) 01/21/2020 10:09 AM   LDLCALC 133 (H) 01/21/2020 10:09 AM    Wt Readings from Last 3 Encounters:  10/11/21 253 lb (114.8 kg)  09/19/21 258 lb 1.3 oz (117.1 kg)  06/25/21 268 lb (121.6 kg)     ASSESSMENT & PLAN:    Nausea Likely related to Trulicity -has stopped it Reglan as needed for now  Chronic migraine without aura without status migrainosus, not intractable Headache likely related to migraine Started Imitrex as needed  Fibromyalgia syndrome On Cymbalta for depression, which can also help with fibromyalgia Has had  PT after rheumatology evaluation Advised to discuss about increasing dose of Lyrica with her psychiatrist  Viral sinusitis Continue symptomatic treatment for now Pain and bulging behind left ear could be infectious secondary LAD - reassured about it, no ear discharge - if persistent, will need further evaluation   Time:   Today, I have spent 19 minutes reviewing the chart, including problem list, medications, and with the patient with telehealth technology discussing the above problems.   Medication Adjustments/Labs and Tests Ordered: Current medicines are reviewed at length with the patient today.  Concerns regarding medicines are outlined above.   Tests Ordered: No orders of the defined types were placed in this encounter.   Medication Changes: No orders of the defined types were placed in this encounter.    Note: This dictation was prepared with Dragon dictation along with smaller phrase technology. Similar sounding words can be transcribed inadequately or may not be corrected upon review. Any transcriptional errors that result from this process are unintentional.      Disposition:  Follow up  Signed, Lindell Spar, MD   10/29/2021 11:40 AM     [facility]

## 2021-10-29 NOTE — Assessment & Plan Note (Signed)
On Cymbalta for depression, which can also help with fibromyalgia Has had PT after rheumatology evaluation Advised to discuss about increasing dose of Lyrica with her psychiatrist

## 2021-11-01 ENCOUNTER — Ambulatory Visit (INDEPENDENT_AMBULATORY_CARE_PROVIDER_SITE_OTHER): Payer: Medicare Other | Admitting: Pharmacist

## 2021-11-01 DIAGNOSIS — E1165 Type 2 diabetes mellitus with hyperglycemia: Secondary | ICD-10-CM

## 2021-11-01 DIAGNOSIS — E782 Mixed hyperlipidemia: Secondary | ICD-10-CM

## 2021-11-01 NOTE — Patient Instructions (Signed)
Sydney Taylor,  It was great to talk to you today!  Please call me with any questions or concerns.   Visit Information Patient Goals/Self-Care Activities Patient will:  Take medications as prescribed Check blood sugar three times a day at the following times: fasting (at least 8 hours since last food consumption), bedtime, and 1-2 hours after a meal, document, and provide at future appointments Collaborate with provider on medication access solutions Engage in dietary modifications by less frequent dining out and fewer sweetened foods & beverages  Patient verbalizes understanding of instructions provided today and agrees to view in [place].   Face to Face appointment with care management team member scheduled for: 11/07/21  Kennon Holter, PharmD Clinical Pharmacist [facility] [phone]

## 2021-11-01 NOTE — Chronic Care Management (AMB) (Signed)
Chronic Care Management Pharmacy Note  11/01/2021 Name:  Sydney Taylor MRN:  [id number] DOB:  Nov 30, 1966  Summary: Type 2 Diabetes Plan to start statin Patient reports recent fasting blood glucose range has improved from 220-250 to 170-180s and postprandial has improved from low 300s to ~220 Continue medications per endocrinology Patient has received Dexcom G6 and has downloaded the app on her phone; however, she has not been able to completely setup yet. Will schedule appointment for training and placement of Dexcom G6.  Hyperlipidemia Recommend addition of at least moderate intensity statin. PCP in agreement to start rosuvastatin 10 mg by mouth daily. Will discuss with patient and send prescription at visit next week.   Other: Patient reports dry mouth has improved some since halving dose of chlorthalidone but has noticed some increased peripheral edema. Patient plans to reach out to PCP regarding this. Likely ok to increase back to 25 mg by mouth daily to control edema. Patient aware this may worsen dry mouth but she is aware to stay hydrated. Nausea has improved some finally over last few days since discontinuation of Trulicity. However, may be reasonable to consider restarting Trulicity at a lower dose. Patient reports tolerating lower doses (0.75 mg and 1.5 mg) with some mild nausea but did not start having vomiting until she increased to 3 mg dose. Patient could benefit from the weight loss of GLP-1 agonist and is approved through [employer]. Patient reports endocrinology discussed Darcel Bayley with her which has similar adverse reaction potential and not to mention is not currently covered under her current insurance.   Subjective: Sydney Taylor is an 55 y.o. year old female who is a primary patient of Lindell Spar, MD.  The CCM team was consulted for assistance with disease management and care coordination needs.    Engaged with patient by telephone for follow up visit in  response to provider referral for pharmacy case management and/or care coordination services.   Consent to Services:  The patient was given information about Chronic Care Management services, agreed to services, and gave verbal consent prior to initiation of services.  Please see initial visit note for detailed documentation.   Patient Care Team: Lindell Spar, MD as PCP - General (Internal Medicine) Fay Records, MD as PCP - Cardiology (Cardiology) Gala Romney Cristopher Estimable, MD as Consulting Physician (Gastroenterology) Beryle Lathe, [facility] (Pharmacist) Kassie Mends, RN as Case Manager  Objective:  Lab Results  Component Value Date   CREATININE 0.85 03/19/2021   CREATININE 0.90 02/23/2021   CREATININE 0.98 09/01/2020    Lab Results  Component Value Date   HGBA1C 11.3 (A) 10/11/2021   Last diabetic Eye exam:  Lab Results  Component Value Date/Time   HMDIABEYEEXA No Retinopathy 06/11/2021 02:22 PM    Last diabetic Foot exam: No results found for: HMDIABFOOTEX      Component Value Date/Time   CHOL 208 (H) 01/21/2020 1009   TRIG 208 (H) 01/21/2020 1009   HDL 40 (L) 01/21/2020 1009   CHOLHDL 5.2 (H) 01/21/2020 1009   LDLCALC 133 (H) 01/21/2020 1009    Hepatic Function Latest Ref Rng & Units 03/19/2021 07/31/2020 07/12/2020  Total Protein 6.1 - 8.1 g/dL 6.4 6.3 5.6(L)  Albumin 3.5 - 5.0 g/dL - - 3.3(L)  AST 10 - 35 U/L 15 17 20   ALT 6 - 29 U/L 16 22 25   Alk Phosphatase 38 - 126 U/L - - 50  Total Bilirubin 0.2 - 1.2 mg/dL 0.4 0.6 0.5  Lab Results  Component Value Date/Time   TSH 1.67 10/11/2021 02:28 PM   TSH 0.93 03/19/2021 02:57 PM   FREET4 0.82 10/11/2021 02:28 PM   FREET4 1.0 12/18/2020 03:32 PM    CBC Latest Ref Rng & Units 09/01/2020 07/31/2020 07/11/2020  WBC 3.4 - 10.8 x10E3/uL 9.7 8.6 11.0(H)  Hemoglobin 11.1 - 15.9 g/dL 14.8 14.9 13.8  Hematocrit 34.0 - 46.6 % 44.2 45.1(H) 41.2  Platelets 150 - 450 x10E3/uL 279 323 320    Lab Results  Component Value  Date/Time   VD25OH 32 01/21/2020 10:09 AM    Clinical ASCVD: No  The 10-year ASCVD risk score (Arnett DK, et al., 2019) is: 4.8%   Values used to calculate the score:     Age: 55 years     Sex: Female     Is Non-Hispanic African American: No     Diabetic: Yes     Tobacco smoker: No     Systolic Blood Pressure: 810 mmHg     Is BP treated: Yes     HDL Cholesterol: 40 mg/dL     Total Cholesterol: 208 mg/dL    Social History   Tobacco Use  Smoking Status Former   Types: Cigarettes   Quit date: 12/30/1982   Years since quitting: 38.8  Smokeless Tobacco Never   BP Readings from Last 3 Encounters:  10/11/21 100/64  09/19/21 125/81  06/25/21 133/80   Pulse Readings from Last 3 Encounters:  10/11/21 (!) 101  09/19/21 (!) 115  06/25/21 86   Wt Readings from Last 3 Encounters:  10/11/21 253 lb (114.8 kg)  09/19/21 258 lb 1.3 oz (117.1 kg)  06/25/21 268 lb (121.6 kg)    Assessment: Review of patient past medical history, allergies, medications, health status, including review of consultants reports, laboratory and other test data, was performed as part of comprehensive evaluation and provision of chronic care management services.   SDOH:  (Social Determinants of Health) assessments and interventions performed:    CCM Care Plan  Allergies  Allergen Reactions   Cephalexin Itching, Swelling and Other (See Comments)    Medications Reviewed Today     Reviewed by Beryle Lathe, [facility] (Pharmacist) on 11/01/21 at 1515  Med List Status: <None>   Medication Order Taking? Sig Documenting Provider Last Dose Status Informant  ALPRAZolam (XANAX) 0.5 MG tablet [id number] Yes Take 0.5 mg by mouth daily as needed. [provider] Taking Active   blood glucose meter kit and supplies KIT [id number]  Inject 1 each into the skin daily. Check blood glucose once a day. Diagnosis code: E 11.9. Doree Albee, MD  Active   buPROPion (WELLBUTRIN SR) 150 MG 12 hr tablet [id number]  Yes Take 150 mg by mouth daily. [provider] Taking Active   chlorthalidone (HYGROTON) 25 MG tablet [id number] Yes TAKE 1 TABLET (25 MG TOTAL) BY MOUTH DAILY.  Patient taking differently: Take 12.5 mg by mouth daily.   Doree Albee, MD Taking Active   Cholecalciferol (VITAMIN D3) 125 MCG (5000 UT) TABS [id number] Yes Take 2 tablets (10,000 Units total) by mouth daily. Doree Albee, MD Taking Active   Continuous Blood Gluc Sensor ([place]) MISC [id number] Yes by Does not apply route. [provider]  Active   Continuous Blood Gluc Transmit ([place]) MISC [id number] Yes by Does not apply route. [provider]  Active   Cyanocobalamin (VITAMIN B12) 1000 MCG TBCR [id number] Yes Take 1,000 mcg by mouth daily.  [provider] Taking Active   DULoxetine (CYMBALTA) 60 MG capsule [id number] Yes Take 60 mg by mouth daily. [provider] Taking Active   empagliflozin (JARDIANCE) 10 MG TABS tablet [id number] Yes Take 1 tablet (10 mg total) by mouth daily before breakfast. Lindell Spar, MD Taking Active   glipiZIDE (GLUCOTROL) 10 MG tablet [id number] Yes TAKE 1 TABLET BY MOUTH TWICE DAILY BEFORE A MEAL Ailene Ards, NP Taking Active   glucose blood (ACCU-CHEK GUIDE) test strip [id number]  1 each by Other route in the morning, at noon, and at bedtime. Use as instructed Ailene Ards, NP  Active   insulin glargine (LANTUS SOLOSTAR) 100 UNIT/ML Solostar Pen [id number] Yes Inject 25 Units into the skin every morning. And pen needles 1/day  Patient taking differently: Inject 35 Units into the skin every morning. And pen needles 1/day   Renato Shin, MD Taking Active   Insulin Pen Needle (PEN NEEDLES) 32G X 4 MM MISC [id number]  Check BS 2 x a day Renato Shin, MD  Active   L-Methylfolate 15 MG TABS [id number] Yes Take 15 mg by mouth daily at 6 (six) AM. [provider] Taking Active   losartan (COZAAR) 25 MG tablet [id number] Yes  Take 1 tablet (25 mg total) by mouth daily. Lindell Spar, MD Taking Active   metFORMIN (GLUCOPHAGE-XR) 500 MG 24 hr tablet [id number] Yes Take 2 tablets (1,000 mg total) by mouth daily with breakfast.  Patient taking differently: Take 1,000 mg by mouth daily with supper.   Renato Shin, MD Taking Active   metoCLOPramide (REGLAN) 5 MG tablet [id number] Yes Take 1 tablet (5 mg total) by mouth every 8 (eight) hours as needed for nausea. Lindell Spar, MD Taking Active   metoprolol tartrate (LOPRESSOR) 50 MG tablet [id number] Yes Take 1 tablet (50 mg total) by mouth 2 (two) times daily. No more refills will be approved by this provider as office is now permanently closed, patient will need to find another primary care provider. Ailene Ards, NP Taking Active   Multiple Vitamins-Minerals (MULTIVITAMIN WOMEN 50+) TABS [id number] Yes Take 1 tablet by mouth daily. [provider] Taking Active Self  Pantoprazole Sodium (PROTONIX PO) [id number] Yes Take 40 mg by mouth daily. [provider] Taking Active   potassium chloride SA (KLOR-CON) 20 MEQ tablet [id number] Yes Take 40 mEq by mouth 2 (two) times daily. [provider] Taking Active Self  pregabalin (LYRICA) 75 MG capsule [id number] Yes Take 75 mg by mouth at bedtime. [provider] Taking Active   Saccharomyces boulardii (PROBIOTIC) 250 MG CAPS [id number] Yes Take 1 capsule by mouth daily. [provider] Taking Active   SUMAtriptan (IMITREX) 50 MG tablet [id number] Yes May repeat in 2 hours if headache persists or recurs. Maximum 2 per day. Lindell Spar, MD Taking Active   traZODone (DESYREL) 50 MG tablet [id number] Yes Take 50 mg by mouth at bedtime as needed. [provider] Taking Active             Patient Active Problem List   Diagnosis Date Noted   Chronic migraine without aura without status migrainosus, not intractable 10/29/2021   Diabetic polyneuropathy associated with type 2  diabetes mellitus ([place]) 09/19/2021   Encounter to establish care 09/19/2021   History of hormone therapy 09/19/2021   Bilateral primary osteoarthritis of knee 04/10/2021   Perennial allergic rhinitis 04/06/2021   Allergic contact dermatitis 04/06/2021   Cholinergic urticaria 04/06/2021   Fibromyalgia  syndrome 01/12/2021   Elevated C-reactive protein (CRP) 01/12/2021   Diverticulosis of colon without hemorrhage 01/02/2021   Major depressive disorder, single episode, severe ([place]) 07/14/2020   GERD (gastroesophageal reflux disease) 05/11/2020   Type 2 diabetes mellitus ([place]) 03/27/2020   Bipolar depression ([place]) 12/28/2019   Sleep apnea 12/28/2019   Hypokalemia 10/31/2019   Tachycardia 10/31/2019   Hyperlipidemia 10/30/2015   Obesity (BMI 30-39.9) 10/30/2015    Immunization History  Administered Date(s) Administered   Influenza,inj,Quad PF,6+ Mos 11/01/2019   Influenza-Unspecified 10/30/2020   Moderna Sars-Covid-2 Vaccination 05/13/2020, 06/23/2020, 07/12/2021   Pneumococcal Polysaccharide-23 12/18/2020   Td 12/30/2020   Zoster Recombinat (Shingrix) 12/30/2020, 05/29/2021    Conditions to be addressed/monitored: HLD and DMII  Care Plan : Medication Management  Updates made by Beryle Lathe, [place] since 11/01/2021 12:00 AM     Problem: Diabetes and Hyperlipidemia   Priority: High  Onset Date: 10/10/2021     Long-Range Goal: Disease Progression Prevention   Start Date: 10/10/2021  Expected End Date: 01/08/2022  Recent Progress: On track  Priority: High  Note:   Current Barriers:  Unable to achieve control of diabetes and hyperlipidemia Suboptimal therapeutic regimen for hyperlipidemia  Pharmacist Clinical Goal(s):  Patient will achieve control of diabetes and hyperlipidemia as evidenced by improved fasting blood sugar, improved A1c, and improved LDL adhere to plan to optimize therapeutic regimen for hyperlipidemia as evidenced by report of adherence to  recommended medication management changes through collaboration with PharmD and provider.   Interventions: 1:1 collaboration with Lindell Spar, MD regarding development and update of comprehensive plan of care as evidenced by provider attestation and co-signature Inter-disciplinary care team collaboration (see longitudinal plan of care) Comprehensive medication review performed; medication list updated in electronic medical record  Type 2 Diabetes - Goal on Track (progressing): YES. (Followed by Dr. Loanne Drilling since 10/11/21) Uncontrolled; Most recent A1c above goal of <7% per ADA guidelines Current medications: Lantus 35 units subcutaneously once daily, empagliflozin (Jardiance) 10 mg by mouth once daily, metformin 1,000 mg by mouth daily with supper, and glipizide IR 10 mg by mouth twice daily with meals Endocrinology recently recommended starting metformin and Lantus Trulicity recently discontinued due to nausea/vomiting Intolerances:  GI upset/drowsiness with metformin Taking medications as directed: yes Side effects thought to be attributed to current medication regimen: no Denies hypoglycemic symptoms (sweaty and shaky). Hypoglycemia prevention: not indicated at this time Current meal patterns:  patient is a vegetarian. Reports that she does not drink soda, juice, or milk. Does not add sugar to her coffee. Eats 3 meals a day. Eats 1-2 pieces of bread per day. Does not eat sweets. Current exercise:  walks some but is generally not very active due to fatigue On a statin: no, but will recommend On aspirin 81 mg daily: no Last microalbumin/creatinine ratio: 5 (12/18/20); on an ACEi/ARB: yes Last eye exam: completed within last year Last foot exam: completed within last year Pneumonia vaccine: up to date Influenza vaccine:  needs this fall Shingles: series complete Current glucose readings: patient checks finger stick blood glucose three time daily. fasting blood glucose: above goal of  80-130 mg/dL per ADA guidelines. post prandial glucose: above goal of <180 mg/dL per ADA guidelines. Patient reports recent fasting blood glucose range has improved from 220-250 to 170-180s and postprandial has improved from low 300s to ~220 The following education materials were previously given to the patient: carbohydrate guide, dining out with Diabetes, and Planning healthy meals Continue above medications per endocrinology.  May be reasonable to consider restarting Trulicity at a lower dose. Patient reports tolerating lower doses (0.75 mg and 1.5 mg) with some mild nausea but did not start having vomiting until she increased to 3 mg dose. Patient could benefit from the weight loss of GLP-1 agonist and is approved through [employer]. Work on reducing carbohydrate intake  Patient has received Dexcom G6 and has downloaded the app on her phone; however, she has not been able to completely setup yet. Will schedule appointment for training and placement of Dexcom G6.  Hyperlipidemia - Goal on Track (progressing): YES.: Uncontrolled. LDL above goal of <70 due to very high risk given diabetes + at least 1 additional major risk factor (hypertension) per 2020 AACE/ACE guidelines. Triglycerides above goal of <150 per 2020 AACE/ACE guidelines. Current medications:  none; patient was previously prescribed atorvastatin but never took it Intolerances: unknown Taking medications as directed: n/a Side effects thought to be attributed to current medication regimen: n/a Recommend addition of at least moderate intensity statin. Will discuss with PCP.  Patient Goals/Self-Care Activities Patient will:  Take medications as prescribed Check blood sugar three times a day at the following times: fasting (at least 8 hours since last food consumption), bedtime, and 1-2 hours after a meal, document, and provide at future appointments Collaborate with provider on medication access solutions Engage in dietary modifications  by less frequent dining out and fewer sweetened foods & beverages  Follow Up Plan: Face to Face appointment with care management team member scheduled for: 11/07/21      Medication Assistance:  Enrolled in [place]  Patient's preferred pharmacy is:  [facility], [place] [address] [place] [address] Phone: [phone] Fax: [place], [place] [address] Phone: [phone] Fax: [phone]  [facility] [id number] - [place], [place] [address] [place] [address] Phone: [phone] Fax: [phone]  Follow Up:  Patient agrees to Care Plan and Follow-up.  Plan: Face to Face appointment with care management team member scheduled for: 11/07/21  Kennon Holter, PharmD Clinical Pharmacist [facility] [phone]

## 2021-11-02 ENCOUNTER — Encounter: Payer: Self-pay | Admitting: Internal Medicine

## 2021-11-05 ENCOUNTER — Other Ambulatory Visit: Payer: Self-pay | Admitting: Internal Medicine

## 2021-11-05 DIAGNOSIS — M7989 Other specified soft tissue disorders: Secondary | ICD-10-CM

## 2021-11-05 MED ORDER — CHLORTHALIDONE 15 MG PO TABS: 15.0000 mg | ORAL_TABLET | Freq: Every day | ORAL | 5 refills | Status: DC

## 2021-11-07 ENCOUNTER — Ambulatory Visit: Payer: Medicare Other | Admitting: Pharmacist

## 2021-11-07 ENCOUNTER — Ambulatory Visit: Payer: Self-pay

## 2021-11-07 ENCOUNTER — Other Ambulatory Visit: Payer: Self-pay

## 2021-11-07 DIAGNOSIS — E782 Mixed hyperlipidemia: Secondary | ICD-10-CM

## 2021-11-07 DIAGNOSIS — E1165 Type 2 diabetes mellitus with hyperglycemia: Secondary | ICD-10-CM

## 2021-11-07 MED ORDER — ROSUVASTATIN CALCIUM 10 MG PO TABS: 10.0000 mg | ORAL_TABLET | Freq: Every day | ORAL | 11 refills | Status: DC

## 2021-11-07 NOTE — Patient Instructions (Signed)
Sydney Taylor,  It was great to talk to you today!  Please call me with any questions or concerns.  Visit Information  Patient Goals/Self-Care Activities Patient will:  Take medications as prescribed Check blood sugar continuously with continuous glucose monitor, document, and provide at future appointments Collaborate with provider on medication access solutions Engage in dietary modifications by less frequent dining out and fewer sweetened foods & beverages  Patient verbalizes understanding of instructions provided today and agrees to view in [place].   Face to Face appointment with care management team member scheduled for: 12/18/21  Kennon Holter, PharmD Clinical Pharmacist [facility] [phone]     Dexcom G6 Training  1. For help or to see the videos: [email or web address]  2. You should have a sensor, a transmitter, and a display device. - Each sensor is good for 10 days - Each transmitter is good for 3 months    3. You will still need your glucose meter - If you take acetaminophen 1000 mg every 6 hours or more - Any time your symptoms do not match the reading on your Dexcom - Any time you don't have a number and an arrow  If you have a MRI or CT scan - The company recommends you not wear the dexcom as it is unknown if the heat and magnetic fields could damage the components. - To get the most out of your session, we advise that you try to schedule your procedure near the end of your sensor session to avoid needing an extra sensor.  4. Change the site where you place the sensor with each new insertion.  If there is adhesive residue left on the skin, you can try applying a household oil like baby oil or coconut oil.  5. Avoid injecting insulin within 3 inches of the sensor.  6. Water. The transmitter is water resistant, but the receiver is not.  You can still swim, shower, and take a bath.  However, your receiver needs to  be in a dry area and within 20 feet of the transmitter to get readings.    7. If you want to use your smartphone: - Download the Dexcom G6 app.  You will need to create a Dexcom account.   - The app and the receiver do not talk to each other.  Therefore, you'll have to set up alerts in the app as well.   8. If you want to share your data with your provider. - Once you have downloaded the Dexcom G6 app, download the Dexcom Clarity app. - If you use the Dexcom G6 app, you will not need to upload data to a computer since the data is automatically sent to [place].   - The clinic will then invite you to share your data with the clinic's account in [place].    Here is how the clinic will create an invitation to send to you:  Click Patients.  Click the patient's name you want to invite.  Click Share data.  Click Print an Invitation to view and print, or Email an Invitation, then enter the patient's email address.  Click Invite, then Close.  9. You can call Dexcom for help: [phone] - For an error message or issues with the sensors: select option 2. - For help with data sharing: select option 3 for Patient Care. They are available Monday through Friday 9 am-8pm andSaturday 10 am-4:30 pm.   10.  The sensor glucose may not always be the same as  your blood glucose. Sensor glucose is a glucose estimate that lags behind the blood glucose.

## 2021-11-07 NOTE — Chronic Care Management (AMB) (Signed)
Chronic Care Management Pharmacy Note  11/07/2021 Name:  Sydney Taylor MRN:  [id number] DOB:  04-04-1966  Summary:  Type 2 Diabetes (Followed by Dr. Loanne Drilling since 10/11/21) Plan to add statin Dexcom G6 training completed with patient today. Sensor and transmitter placed today in clinic. Patient will use Dexcom G6 app on her phone to monitor blood glucose unless her sensor glucose reading do not match her symptoms. Patient given Dexcom support number to call in the event of device malfunction.  Patient has shared Dexcom G6 data with our clinic. Will review with patient at next visit.   Hyperlipidemia Discussed with PCP and will add moderate intensity statin rosuvastatin 10 mg by mouth daily. Will assess adherence/tolerance at next visit.  Subjective: Sydney Taylor is an 55 y.o. year old female who is a primary patient of Lindell Spar, MD.  The CCM team was consulted for assistance with disease management and care coordination needs.    Engaged with patient face to face for follow up visit in response to provider referral for pharmacy case management and/or care coordination services.   Consent to Services:  The patient was given information about Chronic Care Management services, agreed to services, and gave verbal consent prior to initiation of services.  Please see initial visit note for detailed documentation.   Patient Care Team: Lindell Spar, MD as PCP - General (Internal Medicine) Fay Records, MD as PCP - Cardiology (Cardiology) Gala Romney Cristopher Estimable, MD as Consulting Physician (Gastroenterology) Beryle Lathe, [facility] (Pharmacist) Kassie Mends, RN as Case Manager  Objective:  Lab Results  Component Value Date   CREATININE 0.85 03/19/2021   CREATININE 0.90 02/23/2021   CREATININE 0.98 09/01/2020    Lab Results  Component Value Date   HGBA1C 11.3 (A) 10/11/2021   Last diabetic Eye exam:  Lab Results  Component Value Date/Time   HMDIABEYEEXA No  Retinopathy 06/11/2021 02:22 PM    Last diabetic Foot exam: No results found for: HMDIABFOOTEX      Component Value Date/Time   CHOL 208 (H) 01/21/2020 1009   TRIG 208 (H) 01/21/2020 1009   HDL 40 (L) 01/21/2020 1009   CHOLHDL 5.2 (H) 01/21/2020 1009   LDLCALC 133 (H) 01/21/2020 1009    Hepatic Function Latest Ref Rng & Units 03/19/2021 07/31/2020 07/12/2020  Total Protein 6.1 - 8.1 g/dL 6.4 6.3 5.6(L)  Albumin 3.5 - 5.0 g/dL - - 3.3(L)  AST 10 - 35 U/L 15 17 20   ALT 6 - 29 U/L 16 22 25   Alk Phosphatase 38 - 126 U/L - - 50  Total Bilirubin 0.2 - 1.2 mg/dL 0.4 0.6 0.5    Lab Results  Component Value Date/Time   TSH 1.67 10/11/2021 02:28 PM   TSH 0.93 03/19/2021 02:57 PM   FREET4 0.82 10/11/2021 02:28 PM   FREET4 1.0 12/18/2020 03:32 PM    CBC Latest Ref Rng & Units 09/01/2020 07/31/2020 07/11/2020  WBC 3.4 - 10.8 x10E3/uL 9.7 8.6 11.0(H)  Hemoglobin 11.1 - 15.9 g/dL 14.8 14.9 13.8  Hematocrit 34.0 - 46.6 % 44.2 45.1(H) 41.2  Platelets 150 - 450 x10E3/uL 279 323 320    Lab Results  Component Value Date/Time   VD25OH 32 01/21/2020 10:09 AM    Clinical ASCVD: No  The 10-year ASCVD risk score (Arnett DK, et al., 2019) is: 4.8%   Values used to calculate the score:     Age: 28 years     Sex: Female  Is Non-Hispanic African American: No     Diabetic: Yes     Tobacco smoker: No     Systolic Blood Pressure: 161 mmHg     Is BP treated: Yes     HDL Cholesterol: 40 mg/dL     Total Cholesterol: 208 mg/dL    Social History   Tobacco Use  Smoking Status Former   Types: Cigarettes   Quit date: 12/30/1982   Years since quitting: 38.8  Smokeless Tobacco Never   BP Readings from Last 3 Encounters:  10/11/21 100/64  09/19/21 125/81  06/25/21 133/80   Pulse Readings from Last 3 Encounters:  10/11/21 (!) 101  09/19/21 (!) 115  06/25/21 86   Wt Readings from Last 3 Encounters:  10/11/21 253 lb (114.8 kg)  09/19/21 258 lb 1.3 oz (117.1 kg)  06/25/21 268 lb (121.6 kg)     Assessment: Review of patient past medical history, allergies, medications, health status, including review of consultants reports, laboratory and other test data, was performed as part of comprehensive evaluation and provision of chronic care management services.   SDOH:  (Social Determinants of Health) assessments and interventions performed:    CCM Care Plan  Allergies  Allergen Reactions   Cephalexin Itching, Swelling and Other (See Comments)    Medications Reviewed Today     Reviewed by Beryle Lathe, [facility] (Pharmacist) on 11/07/21 at 1316  Med List Status: <None>   Medication Order Taking? Sig Documenting Provider Last Dose Status Informant  ALPRAZolam (XANAX) 0.5 MG tablet [id number] Yes Take 0.5 mg by mouth daily as needed. [provider] Taking Active   blood glucose meter kit and supplies KIT [id number]  Inject 1 each into the skin daily. Check blood glucose once a day. Diagnosis code: E 11.9. Doree Albee, MD  Active   buPROPion (WELLBUTRIN SR) 150 MG 12 hr tablet [id number] Yes Take 150 mg by mouth daily. [provider] Taking Active   chlorthalidone (HYGROTON) 25 MG tablet [id number] Yes Take 12.5 mg by mouth daily. [provider] Taking Active   Cholecalciferol (VITAMIN D3) 125 MCG (5000 UT) TABS [id number] Yes Take 2 tablets (10,000 Units total) by mouth daily. Doree Albee, MD Taking Active   Continuous Blood Gluc Sensor (DEXCOM G6 SENSOR) MISC [id number] No by Does not apply route.  Patient not taking: Reported on 11/07/2021   [provider] Not Taking Active   Continuous Blood Gluc Transmit (DEXCOM G6 TRANSMITTER) MISC [id number] No by Does not apply route.  Patient not taking: Reported on 11/07/2021   [provider] Not Taking Active   Cyanocobalamin (VITAMIN B12) 1000 MCG TBCR [id number] Yes Take 1,000 mcg by mouth daily. [provider] Taking Active   DULoxetine (CYMBALTA) 60 MG capsule  [id number] Yes Take 60 mg by mouth daily. [provider] Taking Active   empagliflozin (JARDIANCE) 10 MG TABS tablet [id number] Yes Take 1 tablet (10 mg total) by mouth daily before breakfast. Lindell Spar, MD Taking Active   fluconazole (DIFLUCAN) 150 MG tablet [id number] No Take 150 mg by mouth once as needed.  Patient not taking: Reported on 11/07/2021   [provider] Not Taking Active   glipiZIDE (GLUCOTROL) 10 MG tablet [id number] Yes TAKE 1 TABLET BY MOUTH TWICE DAILY BEFORE A MEAL Ailene Ards, NP Taking Active   glucose blood (ACCU-CHEK GUIDE) test strip [id number]  1 each by Other route in the morning, at noon, and at bedtime. Use as instructed Ailene Ards,  NP  Active   insulin glargine (LANTUS SOLOSTAR) 100 UNIT/ML Solostar Pen [id number] Yes Inject 25 Units into the skin every morning. And pen needles 1/day  Patient taking differently: Inject 35 Units into the skin every morning. And pen needles 1/day   Renato Shin, MD Taking Active   Insulin Pen Needle (PEN NEEDLES) 32G X 4 MM MISC [id number]  Check BS 2 x a day Renato Shin, MD  Active   ketoconazole (NIZORAL) 2 % shampoo [id number] No Apply topically.  Patient not taking: Reported on 11/07/2021   [provider] Not Taking Active   L-Methylfolate 15 MG TABS [id number] Yes Take 15 mg by mouth daily at 6 (six) AM. [provider] Taking Active   losartan (COZAAR) 25 MG tablet [id number] Yes Take 1 tablet (25 mg total) by mouth daily. Lindell Spar, MD Taking Active   metFORMIN (GLUCOPHAGE-XR) 500 MG 24 hr tablet [id number] Yes Take 2 tablets (1,000 mg total) by mouth daily with breakfast.  Patient taking differently: Take 1,000 mg by mouth daily with supper.   Renato Shin, MD Taking Active   metoCLOPramide (REGLAN) 5 MG tablet [id number] Yes Take 1 tablet (5 mg total) by mouth every 8 (eight) hours as needed for nausea. Lindell Spar, MD Taking Active   metoprolol tartrate (LOPRESSOR) 50 MG  tablet [id number] Yes Take 1 tablet (50 mg total) by mouth 2 (two) times daily. No more refills will be approved by this provider as office is now permanently closed, patient will need to find another primary care provider. Ailene Ards, NP Taking Active   Multiple Vitamins-Minerals (MULTIVITAMIN WOMEN 50+) TABS [id number] Yes Take 1 tablet by mouth daily. [provider] Taking Active Self  Pantoprazole Sodium (PROTONIX PO) [id number] Yes Take 40 mg by mouth daily. [provider] Taking Active   potassium chloride SA (KLOR-CON) 20 MEQ tablet [id number] Yes Take 40 mEq by mouth 2 (two) times daily. [provider] Taking Active Self  pregabalin (LYRICA) 75 MG capsule [id number] Yes Take 75 mg by mouth at bedtime. [provider] Taking Active   Saccharomyces boulardii (PROBIOTIC) 250 MG CAPS [id number] Yes Take 1 capsule by mouth daily. [provider] Taking Active   SUMAtriptan (IMITREX) 50 MG tablet [id number] Yes May repeat in 2 hours if headache persists or recurs. Maximum 2 per day. Lindell Spar, MD Taking Active   traZODone (DESYREL) 50 MG tablet [id number] Yes Take 50 mg by mouth at bedtime as needed. [provider] Taking Active             Patient Active Problem List   Diagnosis Date Noted   Chronic migraine without aura without status migrainosus, not intractable 10/29/2021   Diabetic polyneuropathy associated with type 2 diabetes mellitus ([place]) 09/19/2021   Encounter to establish care 09/19/2021   History of hormone therapy 09/19/2021   Bilateral primary osteoarthritis of knee 04/10/2021   Perennial allergic rhinitis 04/06/2021   Allergic contact dermatitis 04/06/2021   Cholinergic urticaria 04/06/2021   Fibromyalgia syndrome 01/12/2021   Elevated C-reactive protein (CRP) 01/12/2021   Diverticulosis of colon without hemorrhage 01/02/2021   Major depressive disorder, single episode, severe ([place]) 07/14/2020   GERD  (gastroesophageal reflux disease) 05/11/2020   Type 2 diabetes mellitus ([place]) 03/27/2020   Bipolar depression ([place]) 12/28/2019   Sleep apnea 12/28/2019   Hypokalemia 10/31/2019   Tachycardia 10/31/2019   Hyperlipidemia 10/30/2015   Obesity (BMI 30-39.9) 10/30/2015    Immunization History  Administered Date(s) Administered  Influenza,inj,Quad PF,6+ Mos 11/01/2019   Influenza-Unspecified 10/30/2020   Moderna Sars-Covid-2 Vaccination 05/13/2020, 06/23/2020, 07/12/2021   Pneumococcal Polysaccharide-23 12/18/2020   Td 12/30/2020   Zoster Recombinat (Shingrix) 12/30/2020, 05/29/2021    Conditions to be addressed/monitored: HLD and DMII  Care Plan : Medication Management  Updates made by Beryle Lathe, [place] since 11/07/2021 12:00 AM     Problem: Diabetes and Hyperlipidemia   Priority: High  Onset Date: 10/10/2021     Long-Range Goal: Disease Progression Prevention   Start Date: 10/10/2021  Expected End Date: 01/08/2022  Recent Progress: On track  Priority: High  Note:   Current Barriers:  Unable to achieve control of diabetes and hyperlipidemia Suboptimal therapeutic regimen for hyperlipidemia  Pharmacist Clinical Goal(s):  Patient will achieve control of diabetes and hyperlipidemia as evidenced by improved fasting blood sugar, improved A1c, and improved LDL adhere to plan to optimize therapeutic regimen for hyperlipidemia as evidenced by report of adherence to recommended medication management changes through collaboration with PharmD and provider.   Interventions: 1:1 collaboration with Lindell Spar, MD regarding development and update of comprehensive plan of care as evidenced by provider attestation and co-signature Inter-disciplinary care team collaboration (see longitudinal plan of care) Comprehensive medication review performed; medication list updated in electronic medical record  Type 2 Diabetes - Goal on Track (progressing): YES. (Followed by Dr.  Loanne Drilling since 10/11/21) Uncontrolled; Most recent A1c above goal of <7% per ADA guidelines Current medications: Lantus 35 units subcutaneously once daily, empagliflozin (Jardiance) 10 mg by mouth once daily, metformin 1,000 mg by mouth daily with supper, and glipizide IR 10 mg by mouth twice daily with meals Endocrinology recently recommended starting metformin and Lantus Trulicity recently discontinued due to nausea/vomiting Intolerances:  GI upset/drowsiness with metformin; nausea and vomiting with Trulicity 3 mg weekly Taking medications as directed: yes Side effects thought to be attributed to current medication regimen: no Denies hypoglycemic symptoms (sweaty and shaky). Hypoglycemia prevention: not indicated at this time Current meal patterns:  patient is a vegetarian. Reports that she does not drink soda, juice, or milk. Does not add sugar to her coffee. Eats 3 meals a day. Eats 1-2 pieces of bread per day. Does not eat sweets. Current exercise:  walks some but is generally not very active due to fatigue On a statin: no, but will add On aspirin 81 mg daily: no Last microalbumin/creatinine ratio: 5 (12/18/20); on an ACEi/ARB: yes Last eye exam: completed within last year Last foot exam: completed within last year Pneumonia vaccine: up to date Influenza vaccine:  needs this fall Shingles: series complete Current glucose readings:  Not discussed today; Dexcom placed today in clinic and patient will use Dexcom G6 to monitor blood glucose unless her sensor glucose reading do not match her symptoms. The following education materials were previously given to the patient: carbohydrate guide, dining out with Diabetes, and Planning healthy meals Continue above medications per endocrinology. May be reasonable to consider restarting Trulicity at a lower dose. Patient reports tolerating lower doses (0.75 mg and 1.5 mg) with some mild nausea but did not start having vomiting until she increased to 3  mg dose. Patient could benefit from the weight loss of GLP-1 agonist and is approved through [employer]. Work on reducing carbohydrate intake  Patient has shared Dexcom G6 data with our clinic. Will review with patient at next visit.   Hyperlipidemia - Goal on Track (progressing): YES.: Uncontrolled. LDL above goal of <70 due to very high risk given diabetes +  at least 1 additional major risk factor (hypertension) per 2020 AACE/ACE guidelines. Triglycerides above goal of <150 per 2020 AACE/ACE guidelines. Current medications:  none; patient was previously prescribed atorvastatin but never took it Intolerances: unknown Taking medications as directed: n/a Side effects thought to be attributed to current medication regimen: n/a Discussed with PCP and will add moderate intensity statin rosuvastatin 10 mg by mouth daily. Will assess adherence/tolerance at next visit.  Patient Goals/Self-Care Activities Patient will:  Take medications as prescribed Check blood sugar continuously with continuous glucose monitor, document, and provide at future appointments Collaborate with provider on medication access solutions Engage in dietary modifications by less frequent dining out and fewer sweetened foods & beverages  Follow Up Plan: Face to Face appointment with care management team member scheduled for: 12/18/21      Medication Assistance:  [place] for Trulicity (not currently taking)  Patient's preferred pharmacy is:  [facility] [place], [place] [address] [place] [address] Phone: [phone] Fax: [place], [place] - [place] [address] Phone: [phone] Fax: [phone]  [facility] [phone] - [place], [place] AT [place] [address] [place] [address] Phone: [phone] Fax: [phone]  Follow Up:  Patient  agrees to Care Plan and Follow-up.  Plan: Face to Face appointment with care management team member scheduled for: 12/18/21  Kennon Holter, PharmD Clinical Pharmacist [facility] [phone]

## 2021-11-08 ENCOUNTER — Ambulatory Visit: Payer: Self-pay

## 2021-11-08 ENCOUNTER — Other Ambulatory Visit: Payer: Self-pay | Admitting: Internal Medicine

## 2021-11-08 ENCOUNTER — Ambulatory Visit: Payer: Medicare Other | Admitting: *Deleted

## 2021-11-08 DIAGNOSIS — M7989 Other specified soft tissue disorders: Secondary | ICD-10-CM

## 2021-11-08 DIAGNOSIS — E782 Mixed hyperlipidemia: Secondary | ICD-10-CM

## 2021-11-08 DIAGNOSIS — E1165 Type 2 diabetes mellitus with hyperglycemia: Secondary | ICD-10-CM

## 2021-11-08 MED ORDER — CHLORTHALIDONE 25 MG PO TABS: 12.5000 mg | ORAL_TABLET | Freq: Every day | ORAL | 1 refills | Status: DC

## 2021-11-08 NOTE — Patient Instructions (Signed)
Visit Information   PATIENT GOALS/PLAN OF CARE:  Care Plan : RN Care Manager plan of care  Updates made by Kassie Mends, RN since 11/08/2021 12:00 AM     Problem: No plan of care established for management of chronic disease states- DM2, HLD   Priority: High     Long-Range Goal: Development of plan of care for chronic disease management - DM2, HLD   Start Date: 11/08/2021  Expected End Date: 05/07/2022  Priority: High  Note:    Current Barriers:  Knowledge Deficits related to plan of care for management of HLD and DMII  Care Coordination needs related to Mental Health Concerns  and Lacks knowledge of community resource: Patient reports she has depression and is followed by psychiatrist and counselor regularly, declines services of CCM LCSW (PHQ9=10).  Patient requests information "about figuring out which medicare plan is best for me to sign up with"  Chronic Disease Management support and education needs related to HLD and DMII- Patient lives with spouse, is mostly independent with ADL, IADL's, continues to drive, CBG now checked with Dexcom with fasting readings 160-200's range, random readings mid 200's range.  Patient can benefit from education related to ADA/ carbohydrate modified diet.  Patient reports she does not exercise, plans to schedule for flu vaccine.  Patient has chronic pain and saw pain specialist in the past but did not feel was helpful, pain varies day to day, takes medication which is helpful.  Patient reports rosuvastatin is new medication for her.  RNCM Clinical Goal(s):  Patient will verbalize understanding of plan for management of HLD and DMII as evidenced by   verbalize basic understanding of HLD and DMII disease process and self health management plan as evidenced by   attend all scheduled medical appointments: 11/10 xray foot, 11/14 sleep consult Dr. Halford Chessman, 12/13 Dr. Loanne Drilling, 12/20 primary care provider- as evidenced by patient verbalizing attended appointments/  chart review        demonstrate ongoing self health care management ability - calling provider for concerns, questions as evidenced by     patient report, review of EHR and through collaboration with RN Care manager, provider, and care team.   Interventions: 1:1 collaboration with primary care provider regarding development and update of comprehensive plan of care as evidenced by provider attestation and co-signature Inter-disciplinary care team collaboration (see longitudinal plan of care) Evaluation of current treatment plan related to  self management and patient's adherence to plan as established by provider  Hyperlipidemia Interventions: Medication review performed; medication list updated in electronic medical record.  Provider established cholesterol goals reviewed; Counseled on importance of regular laboratory monitoring as prescribed; Provided HLD educational materials; Reviewed role and benefits of statin for ASCVD risk reduction; Discussed strategies to manage statin-induced myalgias; Reviewed importance of limiting foods high in cholesterol; Screening for signs and symptoms of depression related to chronic disease state;  Assessed social determinant of health barriers;  Education-Heart healthy diet included with after visit summary Reviewed importance of getting outside daily and doing some type of exercise if able  Diabetes Interventions: Assessed patient's understanding of A1c goal: <7% Provided education to patient about basic DM disease process; Reviewed medications with patient and discussed importance of medication adherence; Counseled on importance of regular laboratory monitoring as prescribed; Discussed plans with patient for ongoing care management follow up and provided patient with direct contact information for care management team; Provided patient with written educational materials related to hypo and hyperglycemia and importance of correct treatment;  Reviewed  scheduled/upcoming provider appointments including: 11/10 xray of toe, 11/14 sleep consult Dr. Halford Chessman, 12/13 Dr. Loanne Drilling,  12/20 primary care provider; Review of patient status, including review of consultants reports, relevant laboratory and other test results, and medications completed; Screening for signs and symptoms of depression related to chronic disease state;  Assessed social determinant of health barriers;  Education- Hypoglycemia and ADA diet included with after visit summary Reviewed plate method with patient Provided number to [facility] [phone]  Lab Results  Component Value Date   HGBA1C 11.3 (A) 10/11/2021     Patient Goals/Self-Care Activities: Patient will attend all scheduled provider appointments as evidenced by clinician review of documented attendance to scheduled appointments and patient/caregiver report Patient will call provider office for new concerns or questions as evidenced by review of documented incoming telephone call notes and patient report Review education with after visit summary- hypoglycemia, ADA diet, heart healthy diet Please call [facility] at [phone] for help with choosing the best insurance plan for your needs Check blood sugar as prescribed and record, take to doctor's visits Avoid/ limit saturated/ trans fats Get outside daily, try walking for exercise if you are able Follow carbohydrate modified diet/ plate method Schedule your flu vaccine Continue working with your psychiatrist and counselor   Follow Up Plan:  Telephone follow up appointment with care management team member scheduled for:   12/11/2021     Consent to CCM Services: [name] was given information about Chronic Care Management services including:  CCM service includes personalized support from designated clinical staff supervised by her physician, including individualized plan of care and coordination with  other care providers 24/7 contact phone numbers for assistance for urgent and routine care needs. Service will only be billed when office clinical staff spend 20 minutes or more in a month to coordinate care. Only one practitioner may furnish and bill the service in a calendar month. The patient may stop CCM services at any time (effective at the end of the month) by phone call to the office staff. The patient will be responsible for cost sharing (co-pay) of up to 20% of the service fee (after annual deductible is met).  Patient agreed to services and verbal consent obtained.   Patient verbalizes understanding of instructions provided today and agrees to view in [place].   Telephone follow up appointment with care management team member scheduled for:  12/13/2022Carbohydrate Counting for Diabetes Mellitus, Adult Carbohydrate counting is a method of keeping track of how many carbohydrates you eat. Eating carbohydrates increases the amount of sugar (glucose) in the blood. Counting how many carbohydrates you eat improves how well you manage your blood glucose. This, in turn, helps you manage your diabetes. Carbohydrates are measured in grams (g) per serving. It is important to know how many carbohydrates (in grams or by serving size) you can have in each meal. This is different for every person. A dietitian can help you make a meal plan and calculate how many carbohydrates you should have at each meal and snack. What foods contain carbohydrates? Carbohydrates are found in the following foods: Grains, such as breads and cereals. Dried beans and soy products. Starchy vegetables, such as potatoes, peas, and corn. Fruit and fruit juices. Milk and yogurt. Sweets and snack foods, such as cake, cookies, candy, chips, and soft drinks. How do I count carbohydrates in foods? There are two ways to count carbohydrates in food. You can read food labels or  learn standard serving sizes of foods. You can use  either of these methods or a combination of both. Using the Nutrition Facts label The Nutrition Facts list is included on the labels of almost all packaged foods and beverages in the [place]. It includes: The serving size. Information about nutrients in each serving, including the grams of carbohydrate per serving. To use the Nutrition Facts, decide how many servings you will have. Then, multiply the number of servings by the number of carbohydrates per serving. The resulting number is the total grams of carbohydrates that you will be having. Learning the standard serving sizes of foods When you eat carbohydrate foods that are not packaged or do not include Nutrition Facts on the label, you need to measure the servings in order to count the grams of carbohydrates. Measure the foods that you will eat with a food scale or measuring cup, if needed. Decide how many standard-size servings you will eat. Multiply the number of servings by 15. For foods that contain carbohydrates, one serving equals 15 g of carbohydrates. For example, if you eat 2 cups or 10 oz (300 g) of strawberries, you will have eaten 2 servings and 30 g of carbohydrates (2 servings x 15 g = 30 g). For foods that have more than one food mixed, such as soups and casseroles, you must count the carbohydrates in each food that is included. The following list contains standard serving sizes of common carbohydrate-rich foods. Each of these servings has about 15 g of carbohydrates: 1 slice of bread. 1 six-inch (15 cm) tortilla. ? cup or 2 oz (53 g) cooked rice or pasta.  cup or 3 oz (85 g) cooked or canned, drained and rinsed beans or lentils.  cup or 3 oz (85 g) starchy vegetable, such as peas, corn, or squash.  cup or 4 oz (120 g) hot cereal.  cup or 3 oz (85 g) boiled or mashed potatoes, or  or 3 oz (85 g) of a large baked potato.  cup or 4 fl oz (118 mL) fruit juice. 1 cup or 8 fl oz (237 mL) milk. 1 small or 4 oz (106 g)  apple.  or 2 oz (63 g) of a medium banana. 1 cup or 5 oz (150 g) strawberries. 3 cups or 1 oz (28.3 g) popped popcorn. What is an example of carbohydrate counting? To calculate the grams of carbohydrates in this sample meal, follow the steps shown below. Sample meal 3 oz (85 g) chicken breast. ? cup or 4 oz (106 g) brown rice.  cup or 3 oz (85 g) corn. 1 cup or 8 fl oz (237 mL) milk. 1 cup or 5 oz (150 g) strawberries with sugar-free whipped topping. Carbohydrate calculation Identify the foods that contain carbohydrates: Rice. Corn. Milk. Strawberries. Calculate how many servings you have of each food: 2 servings rice. 1 serving corn. 1 serving milk. 1 serving strawberries. Multiply each number of servings by 15 g: 2 servings rice x 15 g = 30 g. 1 serving corn x 15 g = 15 g. 1 serving milk x 15 g = 15 g. 1 serving strawberries x 15 g = 15 g. Add together all of the amounts to find the total grams of carbohydrates eaten: 30 g + 15 g + 15 g + 15 g = 75 g of carbohydrates total. What are tips for following this plan? Shopping Develop a meal plan and then make a shopping list. Buy fresh and frozen vegetables, fresh  and frozen fruit, dairy, eggs, beans, lentils, and whole grains. Look at food labels. Choose foods that have more fiber and less sugar. Avoid processed foods and foods with added sugars. Meal planning Aim to have the same number of grams of carbohydrates at each meal and for each snack time. Plan to have regular, balanced meals and snacks. Where to find more information American Diabetes Association: diabetes.org Centers for Disease Control and Prevention: StoreMirror.com.[place] Academy of Nutrition and Dietetics: eatright.org Association of Diabetes Care & Education Specialists: diabeteseducator.org Summary Carbohydrate counting is a method of keeping track of how many carbohydrates you eat. Eating carbohydrates increases the amount of sugar (glucose) in your blood. Counting  how many carbohydrates you eat improves how well you manage your blood glucose. This helps you manage your diabetes. A dietitian can help you make a meal plan and calculate how many carbohydrates you should have at each meal and snack. This information is not intended to replace advice given to you by your health care provider. Make sure you discuss any questions you have with your health care provider. Document Revised: 07/19/2020 Document Reviewed: 07/19/2020 Elsevier Patient Education  [place] Many factors influence your heart (coronary) health, including eating and exercise habits. Coronary risk increases with abnormal blood fat (lipid) levels. Heart-healthy meal planning includes limiting unhealthy fats, increasing healthy fats, and making other diet and lifestyle changes. What is my plan? Your health care provider may recommend that you: Limit your fat intake to _________% or less of your total calories each day. Limit your saturated fat intake to _________% or less of your total calories each day. Limit the amount of cholesterol in your diet to less than _________ mg per day. What are tips for following this plan? Cooking Cook foods using methods other than frying. Baking, boiling, grilling, and broiling are all good options. Other ways to reduce fat include: Removing the skin from poultry. Removing all visible fats from meats. Steaming vegetables in water or broth. Meal planning  At meals, imagine dividing your plate into fourths: Fill one-half of your plate with vegetables and green salads. Fill one-fourth of your plate with whole grains. Fill one-fourth of your plate with lean protein foods. Eat 4-5 servings of vegetables per day. One serving equals 1 cup raw or cooked vegetable, or 2 cups raw leafy greens. Eat 4-5 servings of fruit per day. One serving equals 1 medium whole fruit,  cup dried fruit,  cup fresh, frozen, or canned fruit, or   cup 100% fruit juice. Eat more foods that contain soluble fiber. Examples include apples, broccoli, carrots, beans, peas, and barley. Aim to get 25-30 g of fiber per day. Increase your consumption of legumes, nuts, and seeds to 4-5 servings per week. One serving of dried beans or legumes equals  cup cooked, 1 serving of nuts is  cup, and 1 serving of seeds equals 1 tablespoon. Fats Choose healthy fats more often. Choose monounsaturated and polyunsaturated fats, such as olive and canola oils, flaxseeds, walnuts, almonds, and seeds. Eat more omega-3 fats. Choose salmon, mackerel, sardines, tuna, flaxseed oil, and ground flaxseeds. Aim to eat fish at least 2 times each week. Check food labels carefully to identify foods with trans fats or high amounts of saturated fat. Limit saturated fats. These are found in animal products, such as meats, butter, and cream. Plant sources of saturated fats include palm oil, palm kernel oil, and coconut oil. Avoid foods with partially hydrogenated oils in them. These  contain trans fats. Examples are stick margarine, some tub margarines, cookies, crackers, and other baked goods. Avoid fried foods. General information Eat more home-cooked food and less restaurant, buffet, and fast food. Limit or avoid alcohol. Limit foods that are high in starch and sugar. Lose weight if you are overweight. Losing just 5-10% of your body weight can help your overall health and prevent diseases such as diabetes and heart disease. Monitor your salt (sodium) intake, especially if you have high blood pressure. Talk with your health care provider about your sodium intake. Try to incorporate more vegetarian meals weekly. What foods can I eat? Fruits All fresh, canned (in natural juice), or frozen fruits. Vegetables Fresh or frozen vegetables (raw, steamed, roasted, or grilled). Green salads. Grains Most grains. Choose whole wheat and whole grains most of the time. Rice and pasta,  including brown rice and pastas made with whole wheat. Meats and other proteins Lean, well-trimmed beef, veal, pork, and lamb. Chicken and [place] without skin. All fish and shellfish. Wild duck, rabbit, pheasant, and venison. Egg whites or low-cholesterol egg substitutes. Dried beans, peas, lentils, and tofu. Seeds and most nuts. Dairy Low-fat or nonfat cheeses, including ricotta and mozzarella. Skim or 1% milk (liquid, powdered, or evaporated). Buttermilk made with low-fat milk. Nonfat or low-fat yogurt. Fats and oils Non-hydrogenated (trans-free) margarines. Vegetable oils, including soybean, sesame, sunflower, olive, peanut, safflower, corn, canola, and cottonseed. Salad dressings or mayonnaise made with a vegetable oil. Beverages Water (mineral or sparkling). Coffee and tea. Diet carbonated beverages. Sweets and desserts Sherbet, gelatin, and fruit ice. Small amounts of dark chocolate. Limit all sweets and desserts. Seasonings and condiments All seasonings and condiments. The items listed above may not be a complete list of foods and beverages you can eat. Contact a dietitian for more options. What foods are not recommended? Fruits Canned fruit in heavy syrup. Fruit in cream or butter sauce. Fried fruit. Limit coconut. Vegetables Vegetables cooked in cheese, cream, or butter sauce. Fried vegetables. Grains Breads made with saturated or trans fats, oils, or whole milk. Croissants. Sweet rolls. Donuts. High-fat crackers, such as cheese crackers. Meats and other proteins Fatty meats, such as hot dogs, ribs, sausage, bacon, rib-eye roast or steak. High-fat deli meats, such as salami and bologna. Caviar. Domestic duck and goose. Organ meats, such as liver. Dairy Cream, sour cream, cream cheese, and creamed cottage cheese. Whole-milk cheeses. Whole or 2% milk (liquid, evaporated, or condensed). Whole buttermilk. Cream sauce or high-fat cheese sauce. Whole-milk yogurt. Fats and oils Meat fat,  or shortening. Cocoa butter, hydrogenated oils, palm oil, coconut oil, palm kernel oil. Solid fats and shortenings, including bacon fat, salt pork, lard, and butter. Nondairy cream substitutes. Salad dressings with cheese or sour cream. Beverages Regular sodas and any drinks with added sugar. Sweets and desserts Frosting. Pudding. Cookies. Cakes. Pies. Milk chocolate or white chocolate. Buttered syrups. Full-fat ice cream or ice cream drinks. The items listed above may not be a complete list of foods and beverages to avoid. Contact a dietitian for more information. Summary Heart-healthy meal planning includes limiting unhealthy fats, increasing healthy fats, and making other diet and lifestyle changes. Lose weight if you are overweight. Losing just 5-10% of your body weight can help your overall health and prevent diseases such as diabetes and heart disease. Focus on eating a balance of foods, including fruits and vegetables, low-fat or nonfat dairy, lean protein, nuts and legumes, whole grains, and heart-healthy oils and fats. This information is not intended to  replace advice given to you by your health care provider. Make sure you discuss any questions you have with your health care provider. Document Revised: 04/26/2021 Document Reviewed: 04/26/2021 Elsevier Patient Education  2022 [place]. Hypoglycemia Hypoglycemia is when the sugar (glucose) level in your blood is too low. Low blood sugar can happen to people who have diabetes and people who do not have diabetes. Low blood sugar can happen quickly, and it can be an emergency. What are the causes? This condition happens most often in people who have diabetes. It may be caused by: Diabetes medicine. Not eating enough, or not eating often enough. Doing more physical activity. Drinking alcohol on an empty stomach. If you do not have diabetes, this condition may be caused by: A tumor in the pancreas. Not eating enough, or not eating for  long periods at a time (fasting). A very bad infection or illness. Problems after having weight loss (bariatric) surgery. Kidney failure or liver failure. Certain medicines. What increases the risk? This condition is more likely to develop in people who: Have diabetes and take medicines to lower their blood sugar. Abuse alcohol. Have a very bad illness. What are the signs or symptoms? Mild Hunger. Sweating and feeling clammy. Feeling dizzy or light-headed. Being sleepy or having trouble sleeping. Feeling like you may vomit (nauseous). A fast heartbeat. A headache. Blurry vision. Mood changes, such as: Being grouchy. Feeling worried or nervous (anxious). Tingling or loss of feeling (numbness) around your mouth, lips, or tongue. Moderate Confusion and poor judgment. Behavior changes. Weakness. Uneven heartbeat. Trouble with moving (coordination). Very low Very low blood sugar (severe hypoglycemia) is a medical emergency. It can cause: Fainting. Seizures. Loss of consciousness (coma). Death. How is this treated? Treating low blood sugar Low blood sugar is often treated by eating or drinking something that has sugar in it right away. The food or drink should contain 15 grams of a fast-acting carb (carbohydrate). Options include: 4 oz (120 mL) of fruit juice. 4 oz (120   Jacqlyn Larsen [facility], BSN RN [employer] copy[employer] Primary [facility] [phone]

## 2021-11-08 NOTE — Chronic Care Management (AMB) (Signed)
Chronic Care Management   CCM RN Visit Note  11/08/2021 Name: Sydney Taylor MRN: [id number] DOB: 1966/02/11  Subjective: Sydney Taylor is a 55 y.o. year old female who is a primary care patient of Lindell Spar, MD. The care management team was consulted for assistance with disease management and care coordination needs.    Engaged with patient by telephone for initial visit in response to provider referral for case management and/or care coordination services.   Consent to Services:  The patient was given the following information about Chronic Care Management services today, agreed to services, and gave verbal consent: 1. CCM service includes personalized support from designated clinical staff supervised by the primary care provider, including individualized plan of care and coordination with other care providers 2. 24/7 contact phone numbers for assistance for urgent and routine care needs. 3. Service will only be billed when office clinical staff spend 20 minutes or more in a month to coordinate care. 4. Only one practitioner may furnish and bill the service in a calendar month. 5.The patient may stop CCM services at any time (effective at the end of the month) by phone call to the office staff. 6. The patient will be responsible for cost sharing (co-pay) of up to 20% of the service fee (after annual deductible is met). Patient agreed to services and consent obtained.  Patient agreed to services and verbal consent obtained.   Assessment: Review of patient past medical history, allergies, medications, health status, including review of consultants reports, laboratory and other test data, was performed as part of comprehensive evaluation and provision of chronic care management services.   SDOH (Social Determinants of Health) assessments and interventions performed:  SDOH Interventions    Flowsheet Row Most Recent Value  SDOH Interventions   Food Insecurity Interventions Intervention  Not Indicated  Transportation Interventions Intervention Not Indicated        CCM Care Plan  Allergies  Allergen Reactions   Cephalexin Itching, Swelling and Other (See Comments)    Outpatient Encounter Medications as of 11/08/2021  Medication Sig   ALPRAZolam (XANAX) 0.5 MG tablet Take 0.5 mg by mouth daily as needed.   blood glucose meter kit and supplies KIT Inject 1 each into the skin daily. Check blood glucose once a day. Diagnosis code: E 11.9.   buPROPion (WELLBUTRIN SR) 150 MG 12 hr tablet Take 150 mg by mouth daily.   chlorthalidone (HYGROTON) 25 MG tablet Take 0.5 tablets (12.5 mg total) by mouth daily.   Cholecalciferol (VITAMIN D3) 125 MCG (5000 UT) TABS Take 2 tablets (10,000 Units total) by mouth daily.   Continuous Blood Gluc Sensor (DEXCOM G6 SENSOR) MISC by Does not apply route.   Continuous Blood Gluc Transmit (DEXCOM G6 TRANSMITTER) MISC by Does not apply route.   Cyanocobalamin (VITAMIN B12) 1000 MCG TBCR Take 1,000 mcg by mouth daily.   DULoxetine (CYMBALTA) 60 MG capsule Take 60 mg by mouth daily.   empagliflozin (JARDIANCE) 10 MG TABS tablet Take 1 tablet (10 mg total) by mouth daily before breakfast.   glipiZIDE (GLUCOTROL) 10 MG tablet TAKE 1 TABLET BY MOUTH TWICE DAILY BEFORE A MEAL   glucose blood (ACCU-CHEK GUIDE) test strip 1 each by Other route in the morning, at noon, and at bedtime. Use as instructed   insulin glargine (LANTUS SOLOSTAR) 100 UNIT/ML Solostar Pen Inject 25 Units into the skin every morning. And pen needles 1/day (Patient taking differently: Inject 35 Units into the skin every morning. And pen needles 1/day)  Insulin Pen Needle (PEN NEEDLES) 32G X 4 MM MISC Check BS 2 x a day   L-Methylfolate 15 MG TABS Take 15 mg by mouth daily at 6 (six) AM.   losartan (COZAAR) 25 MG tablet Take 1 tablet (25 mg total) by mouth daily.   metFORMIN (GLUCOPHAGE-XR) 500 MG 24 hr tablet Take 2 tablets (1,000 mg total) by mouth daily with breakfast. (Patient  taking differently: Take 1,000 mg by mouth daily with supper.)   metoprolol tartrate (LOPRESSOR) 50 MG tablet Take 1 tablet (50 mg total) by mouth 2 (two) times daily. No more refills will be approved by this provider as office is now permanently closed, patient will need to find another primary care provider.   Multiple Vitamins-Minerals (MULTIVITAMIN WOMEN 50+) TABS Take 1 tablet by mouth daily.   Pantoprazole Sodium (PROTONIX PO) Take 40 mg by mouth daily.   pregabalin (LYRICA) 75 MG capsule Take 75 mg by mouth at bedtime.   rosuvastatin (CRESTOR) 10 MG tablet Take 1 tablet (10 mg total) by mouth daily.   Saccharomyces boulardii (PROBIOTIC) 250 MG CAPS Take 1 capsule by mouth daily.   SUMAtriptan (IMITREX) 50 MG tablet May repeat in 2 hours if headache persists or recurs. Maximum 2 per day.   traZODone (DESYREL) 50 MG tablet Take 50 mg by mouth at bedtime as needed.   fluconazole (DIFLUCAN) 150 MG tablet Take 150 mg by mouth once as needed. (Patient not taking: No sig reported)   ketoconazole (NIZORAL) 2 % shampoo Apply topically. (Patient not taking: No sig reported)   metoCLOPramide (REGLAN) 5 MG tablet Take 1 tablet (5 mg total) by mouth every 8 (eight) hours as needed for nausea. (Patient not taking: Reported on 11/08/2021)   potassium chloride SA (KLOR-CON) 20 MEQ tablet Take 40 mEq by mouth 2 (two) times daily.   [DISCONTINUED] chlorthalidone (HYGROTON) 25 MG tablet Take 12.5 mg by mouth daily.   No facility-administered encounter medications on file as of 11/08/2021.    Patient Active Problem List   Diagnosis Date Noted   Chronic migraine without aura without status migrainosus, not intractable 10/29/2021   Diabetic polyneuropathy associated with type 2 diabetes mellitus ([place]) 09/19/2021   Encounter to establish care 09/19/2021   History of hormone therapy 09/19/2021   Bilateral primary osteoarthritis of knee 04/10/2021   Perennial allergic rhinitis 04/06/2021   Allergic contact  dermatitis 04/06/2021   Cholinergic urticaria 04/06/2021   Fibromyalgia syndrome 01/12/2021   Elevated C-reactive protein (CRP) 01/12/2021   Diverticulosis of colon without hemorrhage 01/02/2021   Major depressive disorder, single episode, severe ([place]) 07/14/2020   GERD (gastroesophageal reflux disease) 05/11/2020   Type 2 diabetes mellitus ([place]) 03/27/2020   Bipolar depression ([place]) 12/28/2019   Sleep apnea 12/28/2019   Hypokalemia 10/31/2019   Tachycardia 10/31/2019   Hyperlipidemia 10/30/2015   Obesity (BMI 30-39.9) 10/30/2015    Conditions to be addressed/monitored:HLD and DMII  Care Plan : RN Care Manager plan of care  Updates made by Kassie Mends, RN since 11/08/2021 12:00 AM     Problem: No plan of care established for management of chronic disease states- DM2, HLD   Priority: High     Long-Range Goal: Development of plan of care for chronic disease management - DM2, HLD   Start Date: 11/08/2021  Expected End Date: 05/07/2022  Priority: High  Note:    Current Barriers:  Knowledge Deficits related to plan of care for management of HLD and DMII  Care Coordination needs related to  Mental Health Concerns  and Lacks knowledge of community resource: Patient reports she has depression and is followed by psychiatrist and counselor regularly, declines services of CCM LCSW (PHQ9=10).  Patient requests information "about figuring out which medicare plan is best for me to sign up with"  Chronic Disease Management support and education needs related to HLD and DMII- Patient lives with spouse, is mostly independent with ADL, IADL's, continues to drive, CBG now checked with Dexcom with fasting readings 160-200's range, random readings mid 200's range.  Patient can benefit from education related to ADA/ carbohydrate modified diet.  Patient reports she does not exercise, plans to schedule for flu vaccine.  Patient has chronic pain and saw pain specialist in the past but did not feel was  helpful, pain varies day to day, takes medication which is helpful.  Patient reports rosuvastatin is new medication for her.  RNCM Clinical Goal(s):  Patient will verbalize understanding of plan for management of HLD and DMII as evidenced by   verbalize basic understanding of HLD and DMII disease process and self health management plan as evidenced by   attend all scheduled medical appointments: 11/10 xray foot, 11/14 sleep consult Dr. Halford Chessman, 12/13 Dr. Loanne Drilling, 12/20 primary care provider- as evidenced by patient verbalizing attended appointments/ chart review        demonstrate ongoing self health care management ability - calling provider for concerns, questions as evidenced by     patient report, review of EHR and through collaboration with RN Care manager, provider, and care team.   Interventions: 1:1 collaboration with primary care provider regarding development and update of comprehensive plan of care as evidenced by provider attestation and co-signature Inter-disciplinary care team collaboration (see longitudinal plan of care) Evaluation of current treatment plan related to  self management and patient's adherence to plan as established by provider  Hyperlipidemia Interventions: Medication review performed; medication list updated in electronic medical record.  Provider established cholesterol goals reviewed; Counseled on importance of regular laboratory monitoring as prescribed; Provided HLD educational materials; Reviewed role and benefits of statin for ASCVD risk reduction; Discussed strategies to manage statin-induced myalgias; Reviewed importance of limiting foods high in cholesterol; Screening for signs and symptoms of depression related to chronic disease state;  Assessed social determinant of health barriers;  Education-Heart healthy diet included with after visit summary Reviewed importance of getting outside daily and doing some type of exercise if able  Diabetes  Interventions: Assessed patient's understanding of A1c goal: <7% Provided education to patient about basic DM disease process; Reviewed medications with patient and discussed importance of medication adherence; Counseled on importance of regular laboratory monitoring as prescribed; Discussed plans with patient for ongoing care management follow up and provided patient with direct contact information for care management team; Provided patient with written educational materials related to hypo and hyperglycemia and importance of correct treatment; Reviewed scheduled/upcoming provider appointments including: 11/10 xray of toe, 11/14 sleep consult Dr. Halford Chessman, 12/13 Dr. Loanne Drilling,  12/20 primary care provider; Review of patient status, including review of consultants reports, relevant laboratory and other test results, and medications completed; Screening for signs and symptoms of depression related to chronic disease state;  Assessed social determinant of health barriers;  Education- Hypoglycemia and ADA diet included with after visit summary Reviewed plate method with patient Provided number to [facility] services- [phone]  Lab Results  Component Value Date   HGBA1C 11.3 (A) 10/11/2021     Patient Goals/Self-Care Activities: Patient will attend all  scheduled provider appointments as evidenced by clinician review of documented attendance to scheduled appointments and patient/caregiver report Patient will call provider office for new concerns or questions as evidenced by review of documented incoming telephone call notes and patient report Review education with after visit summary- hypoglycemia, ADA diet, heart healthy diet Please call [facility] at [phone] for help with choosing the best insurance plan for your needs Check blood sugar as prescribed and record, take to doctor's visits Avoid/ limit saturated/ trans fats Get outside daily,  try walking for exercise if you are able Follow carbohydrate modified diet/ plate method Schedule your flu vaccine Continue working with your psychiatrist and counselor   Follow Up Plan:  Telephone follow up appointment with care management team member scheduled for:   12/11/2021     Plan:Telephone follow up appointment with care management team member scheduled for:  12/11/2021  Jacqlyn Larsen [facility], BSN RN Case Manager [facility] [phone]

## 2021-11-09 ENCOUNTER — Ambulatory Visit: Payer: Self-pay

## 2021-11-12 ENCOUNTER — Institutional Professional Consult (permissible substitution): Payer: Medicare Other | Admitting: Pulmonary Disease

## 2021-11-12 ENCOUNTER — Encounter: Payer: Self-pay | Admitting: Endocrinology

## 2021-11-12 ENCOUNTER — Encounter: Payer: Self-pay | Admitting: Internal Medicine

## 2021-11-12 ENCOUNTER — Ambulatory Visit (INDEPENDENT_AMBULATORY_CARE_PROVIDER_SITE_OTHER): Payer: Medicare Other | Admitting: Internal Medicine

## 2021-11-12 ENCOUNTER — Other Ambulatory Visit: Payer: Self-pay

## 2021-11-12 DIAGNOSIS — J0100 Acute maxillary sinusitis, unspecified: Secondary | ICD-10-CM | POA: Diagnosis not present

## 2021-11-12 MED ORDER — AZITHROMYCIN 250 MG PO TABS: ORAL_TABLET | ORAL | 0 refills | Status: AC

## 2021-11-12 NOTE — Progress Notes (Signed)
Virtual Visit via Telephone Note   This visit type was conducted due to national recommendations for restrictions regarding the COVID-19 Pandemic (e.g. social distancing) in an effort to limit this patient's exposure and mitigate transmission in our community.  Due to her co-morbid illnesses, this patient is at least at moderate risk for complications without adequate follow up.  This format is felt to be most appropriate for this patient at this time.  The patient did not have access to video technology/had technical difficulties with video requiring transitioning to audio format only (telephone).  All issues noted in this document were discussed and addressed.  No physical exam could be performed with this format.   Evaluation Performed:  Follow-up visit  Date:  11/12/2021   ID:  Sydney Taylor, DOB [birth year], MRN [id number]  Patient Location: Home Provider Location: Office/Clinic  Participants: Patient Location of Patient: Home Location of Provider: Telehealth Consent was obtain for visit to be over via telehealth. I verified that I am speaking with the correct person using two identifiers.  PCP:  Lindell Spar, MD   Chief Complaint: Cough, runny nose and nasal congestion  History of Present Illness:    Sydney Taylor is a 55 y.o. female who has Televisit for complaint of cough, nasal congestion and runny nose for about 5 to 6 days.  She had exposure to flu positive family members in the last week, but her flu test and COVID test were negative at [facility].  She has tried DayQuil with minimal relief.  She denies any fever, chills, dyspnea or wheezing currently.  She states that her blood glucose has been running high, >200 most of the time. Of note, she has Dexcom in place now and has been checking her sugar levels more frequently now.  The patient does not have symptoms concerning for COVID-19 infection (fever, chills, cough, or new shortness of breath).   Past Medical, Surgical,  Social History, Allergies, and Medications have been Reviewed.  Past Medical History:  Diagnosis Date   Anemia    Anxiety    Arrhythmia    Bipolar depression ([place]) 12/28/2019   Chronic fatigue    Chronic headaches    Depression    Diabetes mellitus without complication (H[facility])    Fibromyalgia    GERD (gastroesophageal reflux disease)    High cholesterol    HLD (hyperlipidemia)    Hypertension    Insect bite    Migraine    Prediabetes    Sleep apnea    Urinary incontinence    Past Surgical History:  Procedure Laterality Date   ESOPHAGOGASTRODUODENOSCOPY N/A 05/12/2020   Procedure: ESOPHAGOGASTRODUODENOSCOPY (EGD);  Surgeon: Daneil Dolin, MD;  Location: [facility];  Service: Endoscopy;  Laterality: N/A;  11:15am   Joint fusion on foot Right    MALONEY DILATION N/A 05/12/2020   Procedure: MALONEY DILATION;  Surgeon: Daneil Dolin, MD;  Location: [facility];  Service: Endoscopy;  Laterality: N/A;   NASAL SEPTUM SURGERY     SINOSCOPY       Current Meds  Medication Sig   ALPRAZolam (XANAX) 0.5 MG tablet Take 0.5 mg by mouth daily as needed.   blood glucose meter kit and supplies KIT Inject 1 each into the skin daily. Check blood glucose once a day. Diagnosis code: E 11.9.   buPROPion (WELLBUTRIN SR) 150 MG 12 hr tablet Take 150 mg by mouth daily.   chlorthalidone (HYGROTON) 25 MG tablet Take 0.5 tablets (12.5 mg total)  by mouth daily.   Cholecalciferol (VITAMIN D3) 125 MCG (5000 UT) TABS Take 2 tablets (10,000 Units total) by mouth daily.   Continuous Blood Gluc Sensor (DEXCOM G6 SENSOR) MISC by Does not apply route.   Continuous Blood Gluc Transmit (DEXCOM G6 TRANSMITTER) MISC by Does not apply route.   Cyanocobalamin (VITAMIN B12) 1000 MCG TBCR Take 1,000 mcg by mouth daily.   DULoxetine (CYMBALTA) 60 MG capsule Take 60 mg by mouth daily.   empagliflozin (JARDIANCE) 10 MG TABS tablet Take 1 tablet (10 mg total) by mouth daily before breakfast.   fluconazole (DIFLUCAN)  150 MG tablet Take 150 mg by mouth once as needed.   glipiZIDE (GLUCOTROL) 10 MG tablet TAKE 1 TABLET BY MOUTH TWICE DAILY BEFORE A MEAL   glucose blood (ACCU-CHEK GUIDE) test strip 1 each by Other route in the morning, at noon, and at bedtime. Use as instructed   insulin glargine (LANTUS SOLOSTAR) 100 UNIT/ML Solostar Pen Inject 25 Units into the skin every morning. And pen needles 1/day (Patient taking differently: Inject 35 Units into the skin every morning. And pen needles 1/day)   Insulin Pen Needle (PEN NEEDLES) 32G X 4 MM MISC Check BS 2 x a day   ketoconazole (NIZORAL) 2 % shampoo Apply topically.   L-Methylfolate 15 MG TABS Take 15 mg by mouth daily at 6 (six) AM.   losartan (COZAAR) 25 MG tablet Take 1 tablet (25 mg total) by mouth daily.   metFORMIN (GLUCOPHAGE-XR) 500 MG 24 hr tablet Take 2 tablets (1,000 mg total) by mouth daily with breakfast. (Patient taking differently: Take 1,000 mg by mouth daily with supper.)   metoCLOPramide (REGLAN) 5 MG tablet Take 1 tablet (5 mg total) by mouth every 8 (eight) hours as needed for nausea.   metoprolol tartrate (LOPRESSOR) 50 MG tablet Take 1 tablet (50 mg total) by mouth 2 (two) times daily. No more refills will be approved by this provider as office is now permanently closed, patient will need to find another primary care provider.   Multiple Vitamins-Minerals (MULTIVITAMIN WOMEN 50+) TABS Take 1 tablet by mouth daily.   Pantoprazole Sodium (PROTONIX PO) Take 40 mg by mouth daily.   potassium chloride SA (KLOR-CON) 20 MEQ tablet Take 40 mEq by mouth 2 (two) times daily.   pregabalin (LYRICA) 75 MG capsule Take 75 mg by mouth at bedtime.   rosuvastatin (CRESTOR) 10 MG tablet Take 1 tablet (10 mg total) by mouth daily.   Saccharomyces boulardii (PROBIOTIC) 250 MG CAPS Take 1 capsule by mouth daily.   SUMAtriptan (IMITREX) 50 MG tablet May repeat in 2 hours if headache persists or recurs. Maximum 2 per day.   traZODone (DESYREL) 50 MG tablet  Take 50 mg by mouth at bedtime as needed.     Allergies:   Cephalexin   ROS:   Please see the history of present illness.     All other systems reviewed and are negative.   Labs/Other Tests and Data Reviewed:    Recent Labs: 03/19/2021: ALT 16; BUN 15; Creat 0.85; Potassium 4.1; Sodium 138 10/11/2021: TSH 1.67   Recent Lipid Panel Lab Results  Component Value Date/Time   CHOL 208 (H) 01/21/2020 10:09 AM   TRIG 208 (H) 01/21/2020 10:09 AM   HDL 40 (L) 01/21/2020 10:09 AM   CHOLHDL 5.2 (H) 01/21/2020 10:09 AM   LDLCALC 133 (H) 01/21/2020 10:09 AM    Wt Readings from Last 3 Encounters:  10/11/21 253 lb (114.8 kg)  09/19/21  258 lb 1.3 oz (117.1 kg)  06/25/21 268 lb (121.6 kg)     ASSESSMENT & PLAN:    Acute sinusitis Started azithromycin as she has persistent symptoms despite symptomatic treatment Mucinex or Robitussin or DayQuil as needed Nasal saline spray as needed  Type II DM States her blood glucose has been above 200 most of the time On Lantus 35 units nightly, metformin, glipizide and Jardiance Has Dexcom in place Advised to wait until her infection resolves - if persistent elevated blood glucose, advised to contact Endocrinology - may need adjustment in Lantus dose  Time:   Today, I have spent 13 minutes reviewing the chart, including problem list, medications, and with the patient with telehealth technology discussing the above problems.   Medication Adjustments/Labs and Tests Ordered: Current medicines are reviewed at length with the patient today.  Concerns regarding medicines are outlined above.   Tests Ordered: No orders of the defined types were placed in this encounter.   Medication Changes: No orders of the defined types were placed in this encounter.    Note: This dictation was prepared with Dragon dictation along with smaller phrase technology. Similar sounding words can be transcribed inadequately or may not be corrected upon review. Any  transcriptional errors that result from this process are unintentional.      Disposition:  Follow up  Signed, Lindell Spar, MD  11/12/2021 11:06 AM     [place]

## 2021-11-12 NOTE — Telephone Encounter (Signed)
Pt made appt 11-12-21

## 2021-11-14 ENCOUNTER — Encounter: Payer: Self-pay | Admitting: Internal Medicine

## 2021-11-15 ENCOUNTER — Telehealth (INDEPENDENT_AMBULATORY_CARE_PROVIDER_SITE_OTHER): Payer: Medicare Other | Admitting: Endocrinology

## 2021-11-15 ENCOUNTER — Encounter: Payer: Self-pay | Admitting: Endocrinology

## 2021-11-15 DIAGNOSIS — E1165 Type 2 diabetes mellitus with hyperglycemia: Secondary | ICD-10-CM | POA: Diagnosis not present

## 2021-11-15 NOTE — Telephone Encounter (Addendum)
I reviewed continuous glucose monitor data.  Glucose varies from 145-300.  It is in general highest at 10AM-11PM.  It then increases until 5AM.  It is then flat until 9AM, when it increases. please contact patient: This is progress. Please then increase the insulin to 50 units each morning.

## 2021-11-16 ENCOUNTER — Encounter: Payer: Self-pay | Admitting: Internal Medicine

## 2021-11-16 ENCOUNTER — Other Ambulatory Visit: Payer: Self-pay | Admitting: Internal Medicine

## 2021-11-16 ENCOUNTER — Encounter: Payer: Self-pay | Admitting: Endocrinology

## 2021-11-16 DIAGNOSIS — J0101 Acute recurrent maxillary sinusitis: Secondary | ICD-10-CM

## 2021-11-16 MED ORDER — MOXIFLOXACIN HCL 400 MG PO TABS: 400.0000 mg | ORAL_TABLET | Freq: Every day | ORAL | 0 refills | Status: AC

## 2021-11-16 NOTE — Telephone Encounter (Signed)
Explained patient's cgm data and how it varies during different times of the day. Informed her that provider would like insulin increased to 50 units every morning. Patient expressed understanding.

## 2021-11-19 ENCOUNTER — Ambulatory Visit: Payer: Self-pay

## 2021-11-28 ENCOUNTER — Other Ambulatory Visit: Payer: Self-pay | Admitting: Internal Medicine

## 2021-11-28 ENCOUNTER — Encounter: Payer: Self-pay | Admitting: Internal Medicine

## 2021-11-28 DIAGNOSIS — G43709 Chronic migraine without aura, not intractable, without status migrainosus: Secondary | ICD-10-CM

## 2021-11-28 DIAGNOSIS — E1165 Type 2 diabetes mellitus with hyperglycemia: Secondary | ICD-10-CM | POA: Diagnosis not present

## 2021-11-28 DIAGNOSIS — E782 Mixed hyperlipidemia: Secondary | ICD-10-CM | POA: Diagnosis not present

## 2021-11-29 ENCOUNTER — Other Ambulatory Visit (HOSPITAL_COMMUNITY): Payer: Self-pay

## 2021-11-29 DIAGNOSIS — N6310 Unspecified lump in the right breast, unspecified quadrant: Secondary | ICD-10-CM

## 2021-12-05 ENCOUNTER — Ambulatory Visit (INDEPENDENT_AMBULATORY_CARE_PROVIDER_SITE_OTHER): Payer: Medicare Other | Admitting: Internal Medicine

## 2021-12-05 ENCOUNTER — Encounter: Payer: Self-pay | Admitting: Internal Medicine

## 2021-12-05 ENCOUNTER — Other Ambulatory Visit: Payer: Self-pay

## 2021-12-05 DIAGNOSIS — J0101 Acute recurrent maxillary sinusitis: Secondary | ICD-10-CM | POA: Diagnosis not present

## 2021-12-05 MED ORDER — AMOXICILLIN-POT CLAVULANATE 875-125 MG PO TABS: 1.0000 | ORAL_TABLET | Freq: Two times a day (BID) | ORAL | 0 refills | Status: DC

## 2021-12-05 NOTE — Progress Notes (Signed)
Virtual Visit via Telephone Note   This visit type was conducted due to national recommendations for restrictions regarding the COVID-19 Pandemic (e.g. social distancing) in an effort to limit this patient's exposure and mitigate transmission in our community.  Due to her co-morbid illnesses, this patient is at least at moderate risk for complications without adequate follow up.  This format is felt to be most appropriate for this patient at this time.  The patient did not have access to video technology/had technical difficulties with video requiring transitioning to audio format only (telephone).  All issues noted in this document were discussed and addressed.  No physical exam could be performed with this format.  Evaluation Performed:  Follow-up visit  Date:  12/05/2021   ID:  Sydney Taylor, DOB [birth year], MRN [id number]  Patient Location: Home Provider Location: Office/Clinic  Participants: Patient Location of Patient: Home Location of Provider: Telehealth Consent was obtain for visit to be over via telehealth. I verified that I am speaking with the correct person using two identifiers.  PCP:  Lindell Spar, MD   Chief Complaint: Cough, nasal congestion and neck LAD  History of Present Illness:    Sydney Taylor is a 55 y.o. female who has a televisit for c/o cough and nasal congestion for about a month now, which improved with Z Pack that was started in the last visit. She also reports enlarged lymph nodes in the neck area, which is painful. She has been taking Nyquil. She has been using Flonase and sinus rinses with no relief. Denies any fever, chills, dyspnea or wheezing.  The patient does not have symptoms concerning for COVID-19 infection (fever, chills, cough, or new shortness of breath).   Past Medical, Surgical, Social History, Allergies, and Medications have been Reviewed.  Past Medical History:  Diagnosis Date   Anemia    Anxiety    Arrhythmia    Bipolar  depression ([place]) 12/28/2019   Chronic fatigue    Chronic headaches    Depression    Diabetes mellitus without complication (HCC)    Fibromyalgia    GERD (gastroesophageal reflux disease)    High cholesterol    HLD (hyperlipidemia)    Hypertension    Insect bite    Migraine    Prediabetes    Sleep apnea    Urinary incontinence    Past Surgical History:  Procedure Laterality Date   ESOPHAGOGASTRODUODENOSCOPY N/A 05/12/2020   Procedure: ESOPHAGOGASTRODUODENOSCOPY (EGD);  Surgeon: Daneil Dolin, MD;  [facility];  Service: Endoscopy;  Laterality: N/A;  11:15am   Joint fusion on foot Right    MALONEY DILATION N/A 05/12/2020   Procedure: MALONEY DILATION;  Surgeon: Daneil Dolin, MD;  Location: [facility];  Service: Endoscopy;  Laterality: N/A;   NASAL SEPTUM SURGERY     SINOSCOPY       Current Meds  Medication Sig   ALPRAZolam (XANAX) 0.5 MG tablet Take 0.5 mg by mouth daily as needed.   amoxicillin-clavulanate (AUGMENTIN) 875-125 MG tablet Take 1 tablet by mouth 2 (two) times daily.   blood glucose meter kit and supplies KIT Inject 1 each into the skin daily. Check blood glucose once a day. Diagnosis code: E 11.9.   buPROPion (WELLBUTRIN SR) 150 MG 12 hr tablet Take 150 mg by mouth daily.   chlorthalidone (HYGROTON) 25 MG tablet Take 0.5 tablets (12.5 mg total) by mouth daily.   Cholecalciferol (VITAMIN D3) 125 MCG (5000 UT) TABS Take 2 tablets (  10,000 Units total) by mouth daily.   Continuous Blood Gluc Sensor (DEXCOM G6 SENSOR) MISC by Does not apply route.   Continuous Blood Gluc Transmit (DEXCOM G6 TRANSMITTER) MISC by Does not apply route.   Cyanocobalamin (VITAMIN B12) 1000 MCG TBCR Take 1,000 mcg by mouth daily.   DULoxetine (CYMBALTA) 60 MG capsule Take 60 mg by mouth daily.   empagliflozin (JARDIANCE) 10 MG TABS tablet Take 1 tablet (10 mg total) by mouth daily before breakfast.   fluconazole (DIFLUCAN) 150 MG tablet Take 150 mg by mouth once as needed.    glipiZIDE (GLUCOTROL) 10 MG tablet TAKE 1 TABLET BY MOUTH TWICE DAILY BEFORE A MEAL   glucose blood (ACCU-CHEK GUIDE) test strip 1 each by Other route in the morning, at noon, and at bedtime. Use as instructed   insulin glargine (LANTUS SOLOSTAR) 100 UNIT/ML Solostar Pen Inject 25 Units into the skin every morning. And pen needles 1/day (Patient taking differently: Inject 35 Units into the skin every morning. And pen needles 1/day)   Insulin Pen Needle (PEN NEEDLES) 32G X 4 MM MISC Check BS 2 x a day   ketoconazole (NIZORAL) 2 % shampoo Apply topically.   L-Methylfolate 15 MG TABS Take 15 mg by mouth daily at 6 (six) AM.   losartan (COZAAR) 25 MG tablet Take 1 tablet (25 mg total) by mouth daily.   metFORMIN (GLUCOPHAGE-XR) 500 MG 24 hr tablet Take 2 tablets (1,000 mg total) by mouth daily with breakfast. (Patient taking differently: Take 1,000 mg by mouth daily with supper.)   metoCLOPramide (REGLAN) 5 MG tablet Take 1 tablet (5 mg total) by mouth every 8 (eight) hours as needed for nausea.   metoprolol tartrate (LOPRESSOR) 50 MG tablet Take 1 tablet (50 mg total) by mouth 2 (two) times daily. No more refills will be approved by this provider as office is now permanently closed, patient will need to find another primary care provider.   Multiple Vitamins-Minerals (MULTIVITAMIN WOMEN 50+) TABS Take 1 tablet by mouth daily.   Pantoprazole Sodium (PROTONIX PO) Take 40 mg by mouth daily.   potassium chloride SA (KLOR-CON) 20 MEQ tablet Take 40 mEq by mouth 2 (two) times daily.   pregabalin (LYRICA) 75 MG capsule Take 75 mg by mouth at bedtime.   rosuvastatin (CRESTOR) 10 MG tablet Take 1 tablet (10 mg total) by mouth daily.   Saccharomyces boulardii (PROBIOTIC) 250 MG CAPS Take 1 capsule by mouth daily.   SUMAtriptan (IMITREX) 50 MG tablet MAY REPEAT IN 2 HOURS IF HEADACHE PERSISTS OR RECURS. MAXIMUM 2 PER DAY.   traZODone (DESYREL) 50 MG tablet Take 50 mg by mouth at bedtime as needed.      Allergies:   Cephalexin   ROS:   Please see the history of present illness.     All other systems reviewed and are negative.   Labs/Other Tests and Data Reviewed:    Recent Labs: 03/19/2021: ALT 16; BUN 15; Creat 0.85; Potassium 4.1; Sodium 138 10/11/2021: TSH 1.67   Recent Lipid Panel Lab Results  Component Value Date/Time   CHOL 208 (H) 01/21/2020 10:09 AM   TRIG 208 (H) 01/21/2020 10:09 AM   HDL 40 (L) 01/21/2020 10:09 AM   CHOLHDL 5.2 (H) 01/21/2020 10:09 AM   LDLCALC 133 (H) 01/21/2020 10:09 AM    Wt Readings from Last 3 Encounters:  10/11/21 253 lb (114.8 kg)  09/19/21 258 lb 1.3 oz (117.1 kg)  06/25/21 268 lb (121.6 kg)  ASSESSMENT & PLAN:    Acute sinusitis Started Augmentin as she has persistent symptoms despite symptomatic treatment, has had Amoxicillin in the past Mucinex or Robitussin or DayQuil as needed Nasal saline spray as needed Continue Flonase for allergies Advised to contact Allergy clinic if persistent symptoms  Time:   Today, I have spent 9 minutes reviewing the chart, including problem list, medications, and with the patient with telehealth technology discussing the above problems.   Medication Adjustments/Labs and Tests Ordered: Current medicines are reviewed at length with the patient today.  Concerns regarding medicines are outlined above.   Tests Ordered: No orders of the defined types were placed in this encounter.   Medication Changes: Meds ordered this encounter  Medications   amoxicillin-clavulanate (AUGMENTIN) 875-125 MG tablet    Sig: Take 1 tablet by mouth 2 (two) times daily.    Dispense:  14 tablet    Refill:  0    Has had Amoxicillin in the past.     Note: This dictation was prepared with Dragon dictation along with smaller phrase technology. Similar sounding words can be transcribed inadequately or may not be corrected upon review. Any transcriptional errors that result from this process are unintentional.       Disposition:  Follow up  Signed, Lindell Spar, MD  12/05/2021 12:19 PM     [place]

## 2021-12-10 ENCOUNTER — Inpatient Hospital Stay
Admission: RE | Admit: 2021-12-10 | Discharge: 2021-12-10 | Disposition: A | Discharge status: Home / Self Care | Payer: Self-pay | Source: Ambulatory Visit

## 2021-12-10 ENCOUNTER — Other Ambulatory Visit (HOSPITAL_COMMUNITY): Payer: Self-pay

## 2021-12-10 DIAGNOSIS — N6310 Unspecified lump in the right breast, unspecified quadrant: Secondary | ICD-10-CM

## 2021-12-11 ENCOUNTER — Ambulatory Visit: Payer: Medicare Other | Admitting: Endocrinology

## 2021-12-11 ENCOUNTER — Ambulatory Visit: Payer: Medicare Other | Admitting: *Deleted

## 2021-12-11 DIAGNOSIS — E1165 Type 2 diabetes mellitus with hyperglycemia: Secondary | ICD-10-CM

## 2021-12-11 DIAGNOSIS — E782 Mixed hyperlipidemia: Secondary | ICD-10-CM

## 2021-12-11 NOTE — Patient Instructions (Signed)
Visit Information  Thank you for taking time to visit with me today. Please don't hesitate to contact me if I can be of assistance to you before our next scheduled telephone appointment.  Following are the goals we discussed today:  Patient will attend all scheduled provider appointments as evidenced by clinician review of documented attendance to scheduled appointments and patient/caregiver report Patient will call provider office for new concerns or questions as evidenced by review of documented incoming telephone call notes and patient report Please call [facility] at [phone] for help with choosing the best insurance plan for your needs Reinforced checking blood sugar as prescribed and record, take to doctor's visits Avoid/ limit saturated/ trans fats Practice portion control Get outside daily, try walking for exercise if you are able Follow carbohydrate modified diet/ plate method Continue working with your psychiatrist and counselor  Our next appointment is  on 02/05/22 at 9 am  Please call the care guide team at [phone] if you need to cancel or reschedule your appointment.   If you are experiencing a Mental Health or [place] or need someone to talk to, please call the [place] National Suicide Prevention Lifeline: (367) 684-7863 or TTY: 787-680-4698 TTY 445-746-5340) to talk to a trained counselor call 1-800-273-TALK (toll free, 24 hour hotline) go to [facility] [address] [place] [phone]) call 911   Patient verbalizes understanding of instructions provided today and agrees to view in [place].   Jacqlyn Larsen [facility], BSN RN Case Manager [facility] [phone]

## 2021-12-11 NOTE — Chronic Care Management (AMB) (Signed)
Chronic Care Management   CCM RN Visit Note  12/11/2021 Name: Sydney Taylor MRN: [id number] DOB: 03/15/1966  Subjective: Sydney Taylor is a 55 y.o. year old female who is a primary care patient of Lindell Spar, MD. The care management team was consulted for assistance with disease management and care coordination needs.    Engaged with patient by telephone for follow up visit in response to provider referral for case management and/or care coordination services.   Consent to Services:  The patient was given information about Chronic Care Management services, agreed to services, and gave verbal consent prior to initiation of services.  Please see initial visit note for detailed documentation.   Patient agreed to services and verbal consent obtained.   Assessment: Review of patient past medical history, allergies, medications, health status, including review of consultants reports, laboratory and other test data, was performed as part of comprehensive evaluation and provision of chronic care management services.   SDOH (Social Determinants of Health) assessments and interventions performed:    CCM Care Plan  Allergies  Allergen Reactions   Cephalexin Itching, Swelling and Other (See Comments)    Outpatient Encounter Medications as of 12/11/2021  Medication Sig   ALPRAZolam (XANAX) 0.5 MG tablet Take 0.5 mg by mouth daily as needed.   amoxicillin-clavulanate (AUGMENTIN) 875-125 MG tablet Take 1 tablet by mouth 2 (two) times daily.   blood glucose meter kit and supplies KIT Inject 1 each into the skin daily. Check blood glucose once a day. Diagnosis code: E 11.9.   buPROPion (WELLBUTRIN SR) 150 MG 12 hr tablet Take 150 mg by mouth daily.   chlorthalidone (HYGROTON) 25 MG tablet Take 0.5 tablets (12.5 mg total) by mouth daily.   Cholecalciferol (VITAMIN D3) 125 MCG (5000 UT) TABS Take 2 tablets (10,000 Units total) by mouth daily.   Continuous Blood Gluc Sensor (DEXCOM G6  SENSOR) MISC by Does not apply route.   Continuous Blood Gluc Transmit (DEXCOM G6 TRANSMITTER) MISC by Does not apply route.   Cyanocobalamin (VITAMIN B12) 1000 MCG TBCR Take 1,000 mcg by mouth daily.   DULoxetine (CYMBALTA) 60 MG capsule Take 60 mg by mouth daily.   empagliflozin (JARDIANCE) 10 MG TABS tablet Take 1 tablet (10 mg total) by mouth daily before breakfast.   fluconazole (DIFLUCAN) 150 MG tablet Take 150 mg by mouth once as needed.   glipiZIDE (GLUCOTROL) 10 MG tablet TAKE 1 TABLET BY MOUTH TWICE DAILY BEFORE A MEAL   glucose blood (ACCU-CHEK GUIDE) test strip 1 each by Other route in the morning, at noon, and at bedtime. Use as instructed   insulin glargine (LANTUS SOLOSTAR) 100 UNIT/ML Solostar Pen Inject 25 Units into the skin every morning. And pen needles 1/day (Patient taking differently: Inject 35 Units into the skin every morning. And pen needles 1/day)   Insulin Pen Needle (PEN NEEDLES) 32G X 4 MM MISC Check BS 2 x a day   ketoconazole (NIZORAL) 2 % shampoo Apply topically.   L-Methylfolate 15 MG TABS Take 15 mg by mouth daily at 6 (six) AM.   losartan (COZAAR) 25 MG tablet Take 1 tablet (25 mg total) by mouth daily.   metFORMIN (GLUCOPHAGE-XR) 500 MG 24 hr tablet Take 2 tablets (1,000 mg total) by mouth daily with breakfast. (Patient taking differently: Take 1,000 mg by mouth daily with supper.)   metoCLOPramide (REGLAN) 5 MG tablet Take 1 tablet (5 mg total) by mouth every 8 (eight) hours as needed for nausea.  metoprolol tartrate (LOPRESSOR) 50 MG tablet Take 1 tablet (50 mg total) by mouth 2 (two) times daily. No more refills will be approved by this provider as office is now permanently closed, patient will need to find another primary care provider.   Multiple Vitamins-Minerals (MULTIVITAMIN WOMEN 50+) TABS Take 1 tablet by mouth daily.   Pantoprazole Sodium (PROTONIX PO) Take 40 mg by mouth daily.   potassium chloride SA (KLOR-CON) 20 MEQ tablet Take 40 mEq by mouth 2  (two) times daily.   pregabalin (LYRICA) 75 MG capsule Take 75 mg by mouth at bedtime.   rosuvastatin (CRESTOR) 10 MG tablet Take 1 tablet (10 mg total) by mouth daily.   Saccharomyces boulardii (PROBIOTIC) 250 MG CAPS Take 1 capsule by mouth daily.   SUMAtriptan (IMITREX) 50 MG tablet MAY REPEAT IN 2 HOURS IF HEADACHE PERSISTS OR RECURS. MAXIMUM 2 PER DAY.   traZODone (DESYREL) 50 MG tablet Take 50 mg by mouth at bedtime as needed.   No facility-administered encounter medications on file as of 12/11/2021.    Patient Active Problem List   Diagnosis Date Noted   Chronic migraine without aura without status migrainosus, not intractable 10/29/2021   Diabetic polyneuropathy associated with type 2 diabetes mellitus ([place]) 09/19/2021   Encounter to establish care 09/19/2021   History of hormone therapy 09/19/2021   Bilateral primary osteoarthritis of knee 04/10/2021   Perennial allergic rhinitis 04/06/2021   Allergic contact dermatitis 04/06/2021   Cholinergic urticaria 04/06/2021   Fibromyalgia syndrome 01/12/2021   Elevated C-reactive protein (CRP) 01/12/2021   Diverticulosis of colon without hemorrhage 01/02/2021   Major depressive disorder, single episode, severe ([place]) 07/14/2020   GERD (gastroesophageal reflux disease) 05/11/2020   Type 2 diabetes mellitus ([place]) 03/27/2020   Bipolar depression ([place]) 12/28/2019   Sleep apnea 12/28/2019   Hypokalemia 10/31/2019   Tachycardia 10/31/2019   Hyperlipidemia 10/30/2015   Obesity (BMI 30-39.9) 10/30/2015    Conditions to be addressed/monitored:HLD and DMII  Care Plan : RN Care Manager plan of care  Updates made by Kassie Mends, RN since 12/11/2021 12:00 AM     Problem: No plan of care established for management of chronic disease states- DM2, HLD   Priority: High     Long-Range Goal: Development of plan of care for chronic disease management - DM2, HLD   Start Date: 11/08/2021  Expected End Date: 05/07/2022  Priority: High   Note:    Current Barriers:  Knowledge Deficits related to plan of care for management of HLD and DMII  Care Coordination needs related to Mental Health Concerns  and Lacks knowledge of community resource: Patient reports she has depression and is followed by psychiatrist and counselor regularly, declines services of CCM LCSW (PHQ9=10).  Patient reports she did research online about the most appropriate insurance plan/ supplement for her.  Chronic Disease Management support and education needs related to HLD and DMII- Patient lives with spouse, is mostly independent with ADL, IADL's, continues to drive, CBG now checked with Dexcom with fasting readings 130-200's range, random readings mid 200's range.  Patient can benefit from education related to ADA/ carbohydrate modified diet.  Patient reports she does not exercise, did receive flu vaccine.  Patient has chronic pain and saw pain specialist in the past but did not feel was helpful, pain varies day to day, takes medication which is helpful.  Patient reports rosuvastatin is new medication for her. Pt reports she did not have xray on toe on 11/10 and pain has  now subsided. Pt reports she is still on amoxicillin and is following up with ENT today for continued nasal congestion, "runny nose" and has had some sinus pain, has improved but still continues to have issues.  RNCM Clinical Goal(s):  Patient will verbalize understanding of plan for management of HLD and DMII as evidenced by   verbalize basic understanding of HLD and DMII disease process and self health management plan as evidenced by   attend all scheduled medical appointments:  12/16 Dr. Loanne Drilling, 12/20 primary care provider- as evidenced by patient verbalizing attended appointments/ chart review        demonstrate ongoing self health care management ability - calling provider for concerns, questions as evidenced by     patient report, review of EHR and through collaboration with RN Care manager,  provider, and care team.   Interventions: 1:1 collaboration with primary care provider regarding development and update of comprehensive plan of care as evidenced by provider attestation and co-signature Inter-disciplinary care team collaboration (see longitudinal plan of care) Evaluation of current treatment plan related to  self management and patient's adherence to plan as established by provider  Hyperlipidemia Interventions: Medication review performed; medication list updated in electronic medical record.  Reviewed role and benefits of statin for ASCVD risk reduction Discussed strategies to manage statin-induced myalgias Reviewed importance of limiting foods high in cholesterol Reinforced importance of getting outside daily and doing some type of exercise if able Reinforced heart healthy diet  Diabetes Interventions: Assessed patient's understanding of A1c goal: <7% Reviewed medications with patient and discussed importance of medication adherence Discussed plans with patient for ongoing care management follow up and provided patient with direct contact information for care management team Reviewed scheduled/upcoming provider appointments including:  12/16 Dr. Loanne Drilling,  12/20 primary care provider Review of patient status, including review of consultants reports, relevant laboratory and other test results, and medications completed Reinforced plate method with patient Reviewed foods high in carbohydrates to limit, be mindful of intake  Lab Results  Component Value Date   HGBA1C 11.3 (A) 10/11/2021     Patient Goals/Self-Care Activities: Patient will attend all scheduled provider appointments as evidenced by clinician review of documented attendance to scheduled appointments and patient/caregiver report Patient will call provider office for new concerns or questions as evidenced by review of documented incoming telephone call notes and patient report Please call [facility] at [phone] for help with choosing the best insurance plan for your needs Reinforced checking blood sugar as prescribed and record, take to doctor's visits Avoid/ limit saturated/ trans fats Practice portion control Get outside daily, try walking for exercise if you are able Follow carbohydrate modified diet/ plate method Continue working with your psychiatrist and counselor   Follow Up Plan:  Telephone follow up appointment with care management team member scheduled for:  02/05/2022     Plan:Telephone follow up appointment with care management team member scheduled for:  02/05/2022  Jacqlyn Larsen [facility], BSN RN Case Manager [facility] [phone]

## 2021-12-12 ENCOUNTER — Encounter: Payer: Self-pay | Admitting: Endocrinology

## 2021-12-12 ENCOUNTER — Other Ambulatory Visit: Payer: Self-pay | Admitting: *Deleted

## 2021-12-12 ENCOUNTER — Other Ambulatory Visit: Payer: Self-pay | Admitting: Endocrinology

## 2021-12-12 ENCOUNTER — Encounter: Payer: Self-pay | Admitting: Internal Medicine

## 2021-12-12 MED ORDER — LANTUS SOLOSTAR 100 UNIT/ML ~~LOC~~ SOPN: 50.0000 [IU] | PEN_INJECTOR | SUBCUTANEOUS | 99 refills | Status: DC

## 2021-12-12 MED ORDER — METOPROLOL TARTRATE 50 MG PO TABS: 50.0000 mg | ORAL_TABLET | Freq: Two times a day (BID) | ORAL | 0 refills | Status: DC

## 2021-12-12 MED ORDER — POTASSIUM CHLORIDE CRYS ER 20 MEQ PO TBCR: 40.0000 meq | EXTENDED_RELEASE_TABLET | Freq: Two times a day (BID) | ORAL | 1 refills | Status: DC

## 2021-12-13 ENCOUNTER — Other Ambulatory Visit: Payer: Self-pay

## 2021-12-13 DIAGNOSIS — E1165 Type 2 diabetes mellitus with hyperglycemia: Secondary | ICD-10-CM

## 2021-12-13 MED ORDER — LANTUS SOLOSTAR 100 UNIT/ML ~~LOC~~ SOPN: 50.0000 [IU] | PEN_INJECTOR | SUBCUTANEOUS | 99 refills | Status: DC

## 2021-12-14 ENCOUNTER — Telehealth: Payer: Self-pay | Admitting: Internal Medicine

## 2021-12-14 ENCOUNTER — Encounter: Payer: Self-pay | Admitting: Endocrinology

## 2021-12-14 ENCOUNTER — Telehealth (INDEPENDENT_AMBULATORY_CARE_PROVIDER_SITE_OTHER): Payer: Medicare Other | Admitting: Endocrinology

## 2021-12-14 ENCOUNTER — Other Ambulatory Visit: Payer: Self-pay

## 2021-12-14 DIAGNOSIS — E1165 Type 2 diabetes mellitus with hyperglycemia: Secondary | ICD-10-CM

## 2021-12-14 MED ORDER — LANTUS SOLOSTAR 100 UNIT/ML ~~LOC~~ SOPN
65.0000 [IU] | PEN_INJECTOR | SUBCUTANEOUS | 99 refills | Status: DC
Start: 2021-12-14 — End: 2022-01-17

## 2021-12-14 NOTE — Progress Notes (Signed)
Subjective:    Patient ID: Sydney Taylor, female    DOB: 10-Feb-1966, 55 y.o.   MRN: [id number]  HPI Pt returns for f/u of diabetes mellitus: DM type: Insulin-requiring type 2 Dx'ed: 1833 Complications: PN Therapy: insulin since 5825, Trulicity and 3 oral meds GDM: never DKA: never Severe hypoglycemia: never Pancreatitis: never Pancreatic imaging: normal on 2022 CT SDOH:  She cannot pay brand name copay.  She gets Trulicity for free, from [employer].  Other: she has never been on insulin; She did not tolerate metformin (abd pain). She has been off thyroid medication x 3 weeks.   Interval history:  telehealth visit today via video visit.  Alternatives to telehealth are presented to this patient, and the patient agrees to the telehealth visit.  Pt is advised of the cost of the visit, and agrees to this, also.   Patient is at home, and I am at the office.   Persons attending the telehealth visit: the patient and I.   She takes Lantus 50/d, x 1 month.  She stopped Trulicity, due to nausea.  I reviewed continuous glucose monitor data.  Glucose varies from 130-270.  It is in general highest at [place], and less high at 9AM-3PM.  Otherwise, There is no trend throughout the day. Past Medical History:  Diagnosis Date   Anemia    Anxiety    Arrhythmia    Bipolar depression ([place]) 12/28/2019   Chronic fatigue    Chronic headaches    Depression    Diabetes mellitus without complication (HCC)    Fibromyalgia    GERD (gastroesophageal reflux disease)    High cholesterol    HLD (hyperlipidemia)    Hypertension    Insect bite    Migraine    Prediabetes    Sleep apnea    Urinary incontinence     Past Surgical History:  Procedure Laterality Date   ESOPHAGOGASTRODUODENOSCOPY N/A 05/12/2020   Procedure: ESOPHAGOGASTRODUODENOSCOPY (EGD);  Surgeon: Daneil Dolin, MD;  Location: [facility];  Service: Endoscopy;  Laterality: N/A;  11:15am   Joint fusion on foot Right    MALONEY DILATION N/A  05/12/2020   Procedure: MALONEY DILATION;  Surgeon: Daneil Dolin, MD;  Location: [facility];  Service: Endoscopy;  Laterality: N/A;   NASAL SEPTUM SURGERY     SINOSCOPY      Social History   Socioeconomic History   Marital status: Married    Spouse name: Not on file   Number of children: 0   Years of education: Not on file   Highest education level: Bachelor's degree (e.g., BA, AB, BS)  Occupational History   Occupation: research associate/drug development  Tobacco Use   Smoking status: Former    Types: Cigarettes    Quit date: 12/30/1982    Years since quitting: 38.9   Smokeless tobacco: Never  Vaping Use   Vaping Use: Never used  Substance and Sexual Activity   Alcohol use: Not Currently    Alcohol/week: 0.0 standard drinks   Drug use: Never   Sexual activity: Yes    Birth control/protection: Post-menopausal  Other Topics Concern   Not on file  Social History Narrative   Married for 3 years.Lives with husband.Bachelors in Education officer, museum.   Right handed   Caffeine: 1-2 per day    Social Determinants of Health   Financial Resource Strain: Not on file  Food Insecurity: No Food Insecurity   Worried About [place] in the Last Year: Never true  Ran Out of Food in the Last Year: Never true  Transportation Needs: No Transportation Needs   Lack of Transportation (Medical): No   Lack of Transportation (Non-Medical): No  Physical Activity: Not on file  Stress: Not on file  Social Connections: Not on file  Intimate Partner Violence: Not on file    Current Outpatient Medications on File Prior to Visit  Medication Sig Dispense Refill   ALPRAZolam (XANAX) 0.5 MG tablet Take 0.5 mg by mouth daily as needed.     amoxicillin-clavulanate (AUGMENTIN) 875-125 MG tablet Take 1 tablet by mouth 2 (two) times daily. 14 tablet 0   blood glucose meter kit and supplies KIT Inject 1 each into the skin daily. Check blood glucose once a day. Diagnosis code: E 11.9. 1 each 0    buPROPion (WELLBUTRIN SR) 150 MG 12 hr tablet Take 150 mg by mouth daily.     chlorthalidone (HYGROTON) 25 MG tablet Take 0.5 tablets (12.5 mg total) by mouth daily. 45 tablet 1   Cholecalciferol (VITAMIN D3) 125 MCG (5000 UT) TABS Take 2 tablets (10,000 Units total) by mouth daily. 180 tablet 1   Continuous Blood Gluc Sensor (DEXCOM G6 SENSOR) MISC by Does not apply route.     Continuous Blood Gluc Transmit (DEXCOM G6 TRANSMITTER) MISC by Does not apply route.     Cyanocobalamin (VITAMIN B12) 1000 MCG TBCR Take 1,000 mcg by mouth daily.     DULoxetine (CYMBALTA) 60 MG capsule Take 60 mg by mouth daily.     fluconazole (DIFLUCAN) 150 MG tablet Take 150 mg by mouth once as needed.     glipiZIDE (GLUCOTROL) 10 MG tablet TAKE 1 TABLET BY MOUTH TWICE DAILY BEFORE A MEAL 180 tablet 0   glucose blood (ACCU-CHEK GUIDE) test strip 1 each by Other route in the morning, at noon, and at bedtime. Use as instructed 300 each 3   Insulin Pen Needle (PEN NEEDLES) 32G X 4 MM MISC Check BS 2 x a day 100 each 5   ketoconazole (NIZORAL) 2 % shampoo Apply topically.     L-Methylfolate 15 MG TABS Take 15 mg by mouth daily at 6 (six) AM.     losartan (COZAAR) 25 MG tablet Take 1 tablet (25 mg total) by mouth daily. 90 tablet 0   metFORMIN (GLUCOPHAGE-XR) 500 MG 24 hr tablet Take 2 tablets (1,000 mg total) by mouth daily with breakfast. (Patient taking differently: Take 1,000 mg by mouth daily with supper.) 180 tablet 3   metoCLOPramide (REGLAN) 5 MG tablet Take 1 tablet (5 mg total) by mouth every 8 (eight) hours as needed for nausea. 30 tablet 0   metoprolol tartrate (LOPRESSOR) 50 MG tablet Take 1 tablet (50 mg total) by mouth 2 (two) times daily. No more refills will be approved by this provider as office is now permanently closed, patient will need to find another primary care provider. 180 tablet 0   Multiple Vitamins-Minerals (MULTIVITAMIN WOMEN 50+) TABS Take 1 tablet by mouth daily.     Pantoprazole Sodium  (PROTONIX PO) Take 40 mg by mouth daily.     potassium chloride SA (KLOR-CON M) 20 MEQ tablet Take 2 tablets (40 mEq total) by mouth 2 (two) times daily. 60 tablet 1   pregabalin (LYRICA) 75 MG capsule Take 75 mg by mouth at bedtime.     rosuvastatin (CRESTOR) 10 MG tablet Take 1 tablet (10 mg total) by mouth daily. 30 tablet 11   Saccharomyces boulardii (PROBIOTIC) 250 MG CAPS Take  1 capsule by mouth daily.     SUMAtriptan (IMITREX) 50 MG tablet MAY REPEAT IN 2 HOURS IF HEADACHE PERSISTS OR RECURS. MAXIMUM 2 PER DAY. 10 tablet 0   traZODone (DESYREL) 50 MG tablet Take 50 mg by mouth at bedtime as needed.     No current facility-administered medications on file prior to visit.    Allergies  Allergen Reactions   Cephalexin Itching, Swelling and Other (See Comments)    Family History  Problem Relation Age of Onset   Hypertension Father    Hyperlipidemia Father    Diabetes Father    Stroke Father    Depression Father    Anxiety disorder Father    Hypertension Mother    Hyperlipidemia Mother    Diabetes Mellitus II Mother    Congestive Heart Failure Mother    Heart attack Mother    Hypertension Sister    Diabetes Sister    Hypertension Brother    Autoimmune disease Brother    Diabetes Maternal Grandmother    Lupus Paternal Grandmother    Depression Paternal Grandmother    Anxiety disorder Paternal Grandmother    Depression Maternal Aunt    Anxiety disorder Maternal Aunt    Colon cancer Neg Hx    Esophageal cancer Neg Hx    Gastric cancer Neg Hx     Ht 5' 9"  (1.753 m)    Wt 252 lb (114.3 kg)    LMP  (LMP Unknown)    BMI 37.21 kg/m    Review of Systems     Objective:   Physical Exam    Lab Results  Component Value Date   CREATININE 0.85 03/19/2021   BUN 15 03/19/2021   NA 138 03/19/2021   K 4.1 03/19/2021   CL 102 03/19/2021   CO2 22 03/19/2021      Assessment & Plan:  Insulin-requiring type 2 DM: uncontrolled  Patient Instructions  check your blood  sugar twice a day.  vary the time of day when you check, between before the 3 meals, and at bedtime.  also check if you have symptoms of your blood sugar being too high or too low.  please keep a record of the readings and bring it to your next appointment here (or you can bring the meter itself).  You can write it on any piece of paper.  please call us sooner if your blood sugar goes below 70, or if most of your readings are over 200.   I have sent a prescription to your pharmacy, to increase the Lantus to 65 units per day.   Please continue the same other medications.   Please come back for a follow-up appointment in 6 weeks.

## 2021-12-14 NOTE — Patient Instructions (Addendum)
check your blood sugar twice a day.  vary the time of day when you check, between before the 3 meals, and at bedtime.  also check if you have symptoms of your blood sugar being too high or too low.  please keep a record of the readings and bring it to your next appointment here (or you can bring the meter itself).  You can write it on any piece of paper.  please call us sooner if your blood sugar goes below 70, or if most of your readings are over 200.   I have sent a prescription to your pharmacy, to increase the Lantus to 65 units per day.   Please continue the same other medications.   Please come back for a follow-up appointment in 6 weeks.

## 2021-12-14 NOTE — Telephone Encounter (Signed)
Pt called in regard to appt 12/20  Pts husband has tested positive for covid  Pt has taken home covid test and is Negative   Pt wants to discuss if she needs to quarantine with husband and if she should come to appt 12/20

## 2021-12-15 ENCOUNTER — Telehealth: Payer: Medicare Other | Admitting: Nurse Practitioner

## 2021-12-15 ENCOUNTER — Encounter: Payer: Self-pay | Admitting: Internal Medicine

## 2021-12-15 ENCOUNTER — Encounter: Payer: Self-pay | Admitting: Nurse Practitioner

## 2021-12-15 ENCOUNTER — Other Ambulatory Visit: Payer: Self-pay | Admitting: Internal Medicine

## 2021-12-15 DIAGNOSIS — U071 COVID-19: Secondary | ICD-10-CM

## 2021-12-15 MED ORDER — PROMETHAZINE-DM 6.25-15 MG/5ML PO SYRP: 5.0000 mL | ORAL_SOLUTION | Freq: Four times a day (QID) | ORAL | 0 refills | Status: DC | PRN

## 2021-12-15 MED ORDER — MOLNUPIRAVIR EUA 200MG CAPSULE: 4.0000 | ORAL_CAPSULE | Freq: Two times a day (BID) | ORAL | 0 refills | Status: AC

## 2021-12-15 NOTE — Progress Notes (Signed)
Virtual Visit Consent   Sydney Taylor, you are scheduled for a virtual visit with Mary-Margaret Hassell Done, [place], a [facility] provider, today.     Just as with appointments in the office, your consent must be obtained to participate.  Your consent will be active for this visit and any virtual visit you may have with one of our providers in the next 365 days.     If you have a MyChart account, a copy of this consent can be sent to you electronically.  All virtual visits are billed to your insurance company just like a traditional visit in the office.    As this is a virtual visit, video technology does not allow for your provider to perform a traditional examination.  This may limit your provider's ability to fully assess your condition.  If your provider identifies any concerns that need to be evaluated in person or the need to arrange testing (such as labs, EKG, etc.), we will make arrangements to do so.     Although advances in technology are sophisticated, we cannot ensure that it will always work on either your end or our end.  If the connection with a video visit is poor, the visit may have to be switched to a telephone visit.  With either a video or telephone visit, we are not always able to ensure that we have a secure connection.     I need to obtain your verbal consent now.   Are you willing to proceed with your visit today? YES   Marwa Fuhrman has provided verbal consent on 12/15/2021 for a virtual visit (video or telephone).   Mary-Margaret Hassell Done, FNP   Date: 12/15/2021 1:24 PM   Virtual Visit via Video Note   I, Mary-Margaret Hassell Done, connected with Sydney Taylor ([id number], Feb 04, 1966) on 12/15/21 at  2:00 PM EST by a video-enabled telemedicine application and verified that I am speaking with the correct person using two identifiers.  Location: Patient: Virtual Visit Location Patient: Home Provider: Virtual Visit Location Provider: Mobile   I discussed the limitations  of evaluation and management by telemedicine and the availability of in person appointments. The patient expressed understanding and agreed to proceed.    History of Present Illness: Sydney Taylor is a 55 y.o. who identifies as a female who was assigned female at birth, and is being seen today for covid prescription.Marland Kitchen  HPI: Patient states that yesterday she started feeling run down. Has pregressed to a cough. Covid was negative yesterday and is positive today.   Review of Systems  Constitutional:  Positive for chills and malaise/fatigue. Negative for fever.  HENT:  Positive for congestion and sore throat.   Respiratory:  Positive for cough and sputum production. Negative for shortness of breath.   Gastrointestinal:  Positive for diarrhea.  Musculoskeletal:  Positive for myalgias.  Neurological:  Positive for headaches.   Problems:  Patient Active Problem List   Diagnosis Date Noted   Chronic migraine without aura without status migrainosus, not intractable 10/29/2021   Diabetic polyneuropathy associated with type 2 diabetes mellitus ([place]) 09/19/2021   Encounter to establish care 09/19/2021   History of hormone therapy 09/19/2021   Bilateral primary osteoarthritis of knee 04/10/2021   Perennial allergic rhinitis 04/06/2021   Allergic contact dermatitis 04/06/2021   Cholinergic urticaria 04/06/2021   Fibromyalgia syndrome 01/12/2021   Elevated C-reactive protein (CRP) 01/12/2021   Diverticulosis of colon without hemorrhage 01/02/2021   Major depressive disorder, single episode, severe ([place]) 07/14/2020  GERD (gastroesophageal reflux disease) 05/11/2020   Type 2 diabetes mellitus ([place]) 03/27/2020   Bipolar depression ([place]) 12/28/2019   Sleep apnea 12/28/2019   Hypokalemia 10/31/2019   Tachycardia 10/31/2019   Hyperlipidemia 10/30/2015   Obesity (BMI 30-39.9) 10/30/2015    Allergies:  Allergies  Allergen Reactions   Cephalexin Itching, Swelling and Other (See Comments)    Medications:  Current Outpatient Medications:    ALPRAZolam (XANAX) 0.5 MG tablet, Take 0.5 mg by mouth daily as needed., Disp: , Rfl:    amoxicillin-clavulanate (AUGMENTIN) 875-125 MG tablet, Take 1 tablet by mouth 2 (two) times daily., Disp: 14 tablet, Rfl: 0   blood glucose meter kit and supplies KIT, Inject 1 each into the skin daily. Check blood glucose once a day. Diagnosis code: E 11.9., Disp: 1 each, Rfl: 0   buPROPion (WELLBUTRIN SR) 150 MG 12 hr tablet, Take 150 mg by mouth daily., Disp: , Rfl:    chlorthalidone (HYGROTON) 25 MG tablet, Take 0.5 tablets (12.5 mg total) by mouth daily., Disp: 45 tablet, Rfl: 1   Cholecalciferol (VITAMIN D3) 125 MCG (5000 UT) TABS, Take 2 tablets (10,000 Units total) by mouth daily., Disp: 180 tablet, Rfl: 1   Continuous Blood Gluc Sensor (DEXCOM G6 SENSOR) MISC, by Does not apply route., Disp: , Rfl:    Continuous Blood Gluc Transmit (DEXCOM G6 TRANSMITTER) MISC, by Does not apply route., Disp: , Rfl:    Cyanocobalamin (VITAMIN B12) 1000 MCG TBCR, Take 1,000 mcg by mouth daily., Disp: , Rfl:    DULoxetine (CYMBALTA) 60 MG capsule, Take 60 mg by mouth daily., Disp: , Rfl:    empagliflozin (JARDIANCE) 10 MG TABS tablet, Take 1 tablet (10 mg total) by mouth daily before breakfast., Disp: 30 tablet, Rfl: 2   fluconazole (DIFLUCAN) 150 MG tablet, Take 150 mg by mouth once as needed., Disp: , Rfl:    glipiZIDE (GLUCOTROL) 10 MG tablet, TAKE 1 TABLET BY MOUTH TWICE DAILY BEFORE A MEAL, Disp: 180 tablet, Rfl: 0   glucose blood (ACCU-CHEK GUIDE) test strip, 1 each by Other route in the morning, at noon, and at bedtime. Use as instructed, Disp: 300 each, Rfl: 3   insulin glargine (LANTUS SOLOSTAR) 100 UNIT/ML Solostar Pen, Inject 65 Units into the skin every morning. And pen needles 1/day, Disp: 75 mL, Rfl: PRN   Insulin Pen Needle (PEN NEEDLES) 32G X 4 MM MISC, Check BS 2 x a day, Disp: 100 each, Rfl: 5   ketoconazole (NIZORAL) 2 % shampoo, Apply topically.,  Disp: , Rfl:    L-Methylfolate 15 MG TABS, Take 15 mg by mouth daily at 6 (six) AM., Disp: , Rfl:    losartan (COZAAR) 25 MG tablet, Take 1 tablet (25 mg total) by mouth daily., Disp: 90 tablet, Rfl: 0   metFORMIN (GLUCOPHAGE-XR) 500 MG 24 hr tablet, Take 2 tablets (1,000 mg total) by mouth daily with breakfast. (Patient taking differently: Take 1,000 mg by mouth daily with supper.), Disp: 180 tablet, Rfl: 3   metoCLOPramide (REGLAN) 5 MG tablet, Take 1 tablet (5 mg total) by mouth every 8 (eight) hours as needed for nausea., Disp: 30 tablet, Rfl: 0   metoprolol tartrate (LOPRESSOR) 50 MG tablet, Take 1 tablet (50 mg total) by mouth 2 (two) times daily. No more refills will be approved by this provider as office is now permanently closed, patient will need to find another primary care provider., Disp: 180 tablet, Rfl: 0   Multiple Vitamins-Minerals (MULTIVITAMIN WOMEN 50+) TABS, Take 1  tablet by mouth daily., Disp: , Rfl:    Pantoprazole Sodium (PROTONIX PO), Take 40 mg by mouth daily., Disp: , Rfl:    potassium chloride SA (KLOR-CON M) 20 MEQ tablet, Take 2 tablets (40 mEq total) by mouth 2 (two) times daily., Disp: 60 tablet, Rfl: 1   pregabalin (LYRICA) 75 MG capsule, Take 75 mg by mouth at bedtime., Disp: , Rfl:    rosuvastatin (CRESTOR) 10 MG tablet, Take 1 tablet (10 mg total) by mouth daily., Disp: 30 tablet, Rfl: 11   Saccharomyces boulardii (PROBIOTIC) 250 MG CAPS, Take 1 capsule by mouth daily., Disp: , Rfl:    SUMAtriptan (IMITREX) 50 MG tablet, MAY REPEAT IN 2 HOURS IF HEADACHE PERSISTS OR RECURS. MAXIMUM 2 PER DAY., Disp: 10 tablet, Rfl: 0   traZODone (DESYREL) 50 MG tablet, Take 50 mg by mouth at bedtime as needed., Disp: , Rfl:   Observations/Objective: Patient is well-developed, well-nourished in no acute distress.  Resting comfortably  at home.  Head is normocephalic, atraumatic.  No labored breathing.  Speech is clear and coherent with logical content.  Patient is alert and  oriented at baseline.  Deep cough Voice raspy.  Assessment and Plan:  Sydney Taylor in today with chief complaint of covid positive  1. Lab test positive for detection of COVID-19 virus 1. Take meds as prescribed 2. Use a cool mist humidifier especially during the winter months and when heat has been humid. 3. Use saline nose sprays frequently 4. Saline irrigations of the nose can be very helpful if done frequently.  * 4X daily for 1 week*  * Use of a nettie pot can be helpful with this. Follow directions with this* 5. Drink plenty of fluids 6. Keep thermostat turn down low 7.For any cough or congestion- promethazine dm as prescribed 8. For fever or aces or pains- take tylenol or ibuprofen appropriate for age and weight.  * for fevers greater than 101 orally you may alternate ibuprofen and tylenol every  3 hours.   Meds ordered this encounter  Medications   molnupiravir EUA (LAGEVRIO) 200 mg CAPS capsule    Sig: Take 4 capsules (800 mg total) by mouth 2 (two) times daily for 5 days.    Dispense:  40 capsule    Refill:  0    Order Specific Question:   Supervising Provider    Answer:   Noemi Chapel [[id number]]   promethazine-dextromethorphan (PROMETHAZINE-DM) 6.25-15 MG/5ML syrup    Sig: Take 5 mLs by mouth 4 (four) times daily as needed for cough.    Dispense:  118 mL    Refill:  0    Order Specific Question:   Supervising Provider    Answer:   Noemi Chapel [[id number]]      Follow Up Instructions: I discussed the assessment and treatment plan with the patient. The patient was provided an opportunity to ask questions and all were answered. The patient agreed with the plan and demonstrated an understanding of the instructions.  A copy of instructions were sent to the patient via MyChart.  The patient was advised to call back or seek an in-person evaluation if the symptoms worsen or if the condition fails to improve as anticipated.  Time:  I spent 10 minutes with the patient via  telehealth technology discussing the above problems/concerns.    Mary-Margaret Hassell Done, FNP

## 2021-12-17 ENCOUNTER — Telehealth: Payer: Self-pay | Admitting: Internal Medicine

## 2021-12-17 NOTE — Telephone Encounter (Signed)
This has already been sent to pharmacy

## 2021-12-17 NOTE — Telephone Encounter (Signed)
You can move appt

## 2021-12-17 NOTE — Telephone Encounter (Signed)
Appt moved and pt aware

## 2021-12-17 NOTE — Telephone Encounter (Signed)
Pt needs refill on   JARDIANCE 10 MG TABS tablet    [facility] [address] , [place]

## 2021-12-18 ENCOUNTER — Encounter: Payer: Medicare Other | Admitting: Internal Medicine

## 2021-12-18 ENCOUNTER — Ambulatory Visit (INDEPENDENT_AMBULATORY_CARE_PROVIDER_SITE_OTHER): Payer: Medicare Other | Admitting: Pharmacist

## 2021-12-18 DIAGNOSIS — E782 Mixed hyperlipidemia: Secondary | ICD-10-CM

## 2021-12-18 DIAGNOSIS — E1165 Type 2 diabetes mellitus with hyperglycemia: Secondary | ICD-10-CM

## 2021-12-18 NOTE — Chronic Care Management (AMB) (Signed)
Chronic Care Management Pharmacy Note  12/18/2021 Name:  Sydney Taylor MRN:  [id number] DOB:  1966/09/13  Summary: Type 2 Diabetes (Followed by Dr. Loanne Drilling) Uncontrolled but improving; A1c 12.4>8.5 over last 3 months Current medications: Lantus 65 units subcutaneously once daily (increased from 50 units daily by Dr. Loanne Drilling on 12/14/21), empagliflozin (Jardiance) 10 mg by mouth once daily, metformin 1,000 mg by mouth daily with supper, and glipizide IR 10 mg by mouth twice daily with meals Current glucose readings: See Dexcom report below for last 30 days. Significant improvement in blood glucose from prior visits. Time in range 33%. Time above range 66%. Time below range <1%. One hypoglycemia event yesterday (12/17/21) per sensor but suspect sensor malfunction since blood glucose went from 245>60 within 30 minutes. Doubt current medication regimen would have caused this. Patient was unable to validate with finger stick blood glucose. Patient instructed to use Dexcom G6 to monitor blood glucose unless her sensor glucose reading does not match her symptoms. Continue above medications per endocrinology.         Hyperlipidemia Uncontrolled but improving; LDL now at goal of <70 due to very high risk given diabetes + at least 1 additional major risk factor (hypertension) per 2020 AACE/ACE guidelines. Triglycerides above goal of <150 per 2020 AACE/ACE guidelines. Continue rosuvastatin 10 mg by mouth once daily Triglycerides will likely improve as blood glucose continues to improve.  Subjective: Sydney Taylor is an 55 y.o. year old female who is a primary patient of Lindell Spar, MD.  The CCM team was consulted for assistance with disease management and care coordination needs.    Engaged with patient by telephone for follow up visit in response to provider referral for pharmacy case management and/or care coordination services.   Consent to Services:  The patient was given  information about Chronic Care Management services, agreed to services, and gave verbal consent prior to initiation of services.  Please see initial visit note for detailed documentation.   Patient Care Team: Lindell Spar, MD as PCP - General (Internal Medicine) Fay Records, MD as PCP - Cardiology (Cardiology) Gala Romney Cristopher Estimable, MD as Consulting Physician (Gastroenterology) Beryle Lathe, [facility] (Pharmacist) Kassie Mends, RN as Case Manager  Objective:  Lab Results  Component Value Date   CREATININE 1.14 (H) 12/14/2021   CREATININE 0.85 03/19/2021   CREATININE 0.90 02/23/2021    Lab Results  Component Value Date   HGBA1C 8.5 (H) 12/14/2021   Last diabetic Eye exam:  Lab Results  Component Value Date/Time   HMDIABEYEEXA No Retinopathy 06/11/2021 02:22 PM    Last diabetic Foot exam: No results found for: HMDIABFOOTEX      Component Value Date/Time   CHOL 136 12/14/2021 1012   TRIG 189 (H) 12/14/2021 1012   HDL 36 (L) 12/14/2021 1012   CHOLHDL 3.8 12/14/2021 1012   CHOLHDL 5.2 (H) 01/21/2020 1009   LDLCALC 68 12/14/2021 1012   LDLCALC 133 (H) 01/21/2020 1009    Hepatic Function Latest Ref Rng & Units 12/14/2021 03/19/2021 07/31/2020  Total Protein 6.0 - 8.5 g/dL 6.2 6.4 6.3  Albumin 3.8 - 4.9 g/dL 4.2 - -  AST 0 - 40 IU/L _0 ALT 0 - 32 IU/L _1 Alk Phosphatase 44 - 121 IU/L 83 - -  Total Bilirubin 0.0 - 1.2 mg/dL 0.4 0.4 0.6    Lab Results  Component Value Date/Time   TSH 2.220 12/14/2021 10:12 AM   TSH  1.67 10/11/2021 02:28 PM   FREET4 0.95 12/14/2021 10:12 AM   FREET4 0.82 10/11/2021 02:28 PM    CBC Latest Ref Rng & Units 12/14/2021 09/01/2020 07/31/2020  WBC 3.4 - 10.8 x10E3/uL 9.9 9.7 8.6  Hemoglobin 11.1 - 15.9 g/dL 14.9 14.8 14.9  Hematocrit 34.0 - 46.6 % 45.1 44.2 45.1(H)  Platelets 150 - 450 x10E3/uL 357 279 323    Lab Results  Component Value Date/Time   VD25OH 32 01/21/2020 10:09 AM    Clinical ASCVD: No  The 10-year ASCVD  risk score (Arnett DK, et al., 2019) is: 3.4%   Values used to calculate the score:     Age: 31 years     Sex: Female     Is Non-Hispanic African American: No     Diabetic: Yes     Tobacco smoker: No     Systolic Blood Pressure: 324 mmHg     Is BP treated: Yes     HDL Cholesterol: 36 mg/dL     Total Cholesterol: 136 mg/dL    Social History   Tobacco Use  Smoking Status Former   Types: Cigarettes   Quit date: 12/30/1982   Years since quitting: 38.9  Smokeless Tobacco Never   BP Readings from Last 3 Encounters:  10/11/21 100/64  09/19/21 125/81  06/25/21 133/80   Pulse Readings from Last 3 Encounters:  10/11/21 (!) 101  09/19/21 (!) 115  06/25/21 86   Wt Readings from Last 3 Encounters:  12/14/21 252 lb (114.3 kg)  10/11/21 253 lb (114.8 kg)  09/19/21 258 lb 1.3 oz (117.1 kg)    Assessment: Review of patient past medical history, allergies, medications, health status, including review of consultants reports, laboratory and other test data, was performed as part of comprehensive evaluation and provision of chronic care management services.   SDOH:  (Social Determinants of Health) assessments and interventions performed:    CCM Care Plan  Allergies  Allergen Reactions   Cephalexin Itching, Swelling and Other (See Comments)    Medications Reviewed Today     Reviewed by Beryle Lathe, [facility] (Pharmacist) on 12/18/21 at 1418  Med List Status: <None>   Medication Order Taking? Sig Documenting Provider Last Dose Status Informant  ALPRAZolam (XANAX) 0.5 MG tablet [id number] Yes Take 0.5 mg by mouth daily as needed. [provider] Taking Active   amoxicillin-clavulanate (AUGMENTIN) 875-125 MG tablet [id number] No Take 1 tablet by mouth 2 (two) times daily. Lindell Spar, MD Unknown Active   blood glucose meter kit and supplies KIT [id number]  Inject 1 each into the skin daily. Check blood glucose once a day. Diagnosis code: E 11.9. Doree Albee, MD   Active   buPROPion (WELLBUTRIN SR) 150 MG 12 hr tablet [id number] Yes Take 150 mg by mouth daily. [provider] Taking Active   chlorthalidone (HYGROTON) 25 MG tablet [id number] Yes Take 0.5 tablets (12.5 mg total) by mouth daily. Lindell Spar, MD Taking Active   Cholecalciferol (VITAMIN D3) 125 MCG (5000 UT) TABS [id number] Yes Take 2 tablets (10,000 Units total) by mouth daily. Doree Albee, MD Taking Active   Continuous Blood Gluc Sensor ([place]) MISC [id number] Yes by Does not apply route. [provider] Taking Active   Continuous Blood Gluc Transmit ([place]) MISC [id number] Yes by Does not apply route. [provider] Taking Active   Cyanocobalamin (VITAMIN B12) 1000 MCG TBCR [id number] Yes Take 1,000 mcg by mouth daily. [provider] Taking  Active   DULoxetine (CYMBALTA) 60 MG capsule [id number] Yes Take 60 mg by mouth daily. [provider] Taking Active   fluconazole (DIFLUCAN) 150 MG tablet [id number] Yes Take 150 mg by mouth once as needed. [provider] Taking Active   glipiZIDE (GLUCOTROL) 10 MG tablet [id number] Yes TAKE 1 TABLET BY MOUTH TWICE DAILY BEFORE A MEAL Ailene Ards, NP Taking Active   glucose blood (ACCU-CHEK GUIDE) test strip [id number]  1 each by Other route in the morning, at noon, and at bedtime. Use as instructed Ailene Ards, NP  Active   insulin glargine (LANTUS SOLOSTAR) 100 UNIT/ML Solostar Pen [id number] Yes Inject 65 Units into the skin every morning. And pen needles 1/day Renato Shin, MD Taking Active   Insulin Pen Needle (PEN NEEDLES) 32G X 4 MM MISC [id number]  Check BS 2 x a day Renato Shin, MD  Active   JARDIANCE 10 MG TABS tablet [id number] Yes TAKE 1 TABLET(10 MG) BY MOUTH DAILY BEFORE BREAKFAST Lindell Spar, MD Taking Active   ketoconazole (NIZORAL) 2 % shampoo [id number] Yes Apply topically. [provider] Taking Active   L-Methylfolate 15 MG TABS [id number]  Yes Take 15 mg by mouth daily at 6 (six) AM. [provider] Taking Active   losartan (COZAAR) 25 MG tablet [id number] Yes Take 1 tablet (25 mg total) by mouth daily. Lindell Spar, MD Taking Active   metFORMIN (GLUCOPHAGE-XR) 500 MG 24 hr tablet [id number] Yes Take 2 tablets (1,000 mg total) by mouth daily with breakfast.  Patient taking differently: Take 1,000 mg by mouth daily with supper.   Renato Shin, MD Taking Active   metoCLOPramide (REGLAN) 5 MG tablet [id number] Yes Take 1 tablet (5 mg total) by mouth every 8 (eight) hours as needed for nausea. Lindell Spar, MD Taking Active   metoprolol tartrate (LOPRESSOR) 50 MG tablet [id number] Yes Take 1 tablet (50 mg total) by mouth 2 (two) times daily. No more refills will be approved by this provider as office is now permanently closed, patient will need to find another primary care provider. Lindell Spar, MD Taking Active   molnupiravir EUA (LAGEVRIO) 200 mg CAPS capsule [id number] No Take 4 capsules (800 mg total) by mouth 2 (two) times daily for 5 days. Hassell Done Mary-Margaret, FNP Unknown Active   Multiple Vitamins-Minerals (MULTIVITAMIN WOMEN 50+) TABS [id number] Yes Take 1 tablet by mouth daily. [provider] Taking Active Self  Pantoprazole Sodium (PROTONIX PO) [id number] Yes Take 40 mg by mouth daily. [provider] Taking Active   potassium chloride SA (KLOR-CON M) 20 MEQ tablet [id number] Yes Take 2 tablets (40 mEq total) by mouth 2 (two) times daily. Lindell Spar, MD Taking Active   pregabalin (LYRICA) 75 MG capsule [id number] Yes Take 75 mg by mouth at bedtime. [provider] Taking Active   promethazine-dextromethorphan (PROMETHAZINE-DM) 6.25-15 MG/5ML syrup [id number] No Take 5 mLs by mouth 4 (four) times daily as needed for cough. Hassell Done, Mary-Margaret, FNP Unknown Active   rosuvastatin (CRESTOR) 10 MG tablet [id number] Yes Take 1 tablet (10 mg total) by mouth daily. Lindell Spar, MD Taking  Active   Saccharomyces boulardii (PROBIOTIC) 250 MG CAPS [id number] Yes Take 1 capsule by mouth daily. [provider] Taking Active   SUMAtriptan (IMITREX) 50 MG tablet [id number] Yes MAY REPEAT IN 2 HOURS IF HEADACHE PERSISTS OR RECURS. MAXIMUM 2 PER DAY. Lindell Spar, MD Taking Active   traZODone (DESYREL) 50 MG tablet [id number] Yes Take  50 mg by mouth at bedtime as needed. [provider] Taking Active             Patient Active Problem List   Diagnosis Date Noted   Chronic migraine without aura without status migrainosus, not intractable 10/29/2021   Diabetic polyneuropathy associated with type 2 diabetes mellitus ([place]) 09/19/2021   Encounter to establish care 09/19/2021   History of hormone therapy 09/19/2021   Bilateral primary osteoarthritis of knee 04/10/2021   Perennial allergic rhinitis 04/06/2021   Allergic contact dermatitis 04/06/2021   Cholinergic urticaria 04/06/2021   Fibromyalgia syndrome 01/12/2021   Elevated C-reactive protein (CRP) 01/12/2021   Diverticulosis of colon without hemorrhage 01/02/2021   Major depressive disorder, single episode, severe ([place]) 07/14/2020   GERD (gastroesophageal reflux disease) 05/11/2020   Type 2 diabetes mellitus ([place]) 03/27/2020   Bipolar depression ([place]) 12/28/2019   Sleep apnea 12/28/2019   Hypokalemia 10/31/2019   Tachycardia 10/31/2019   Hyperlipidemia 10/30/2015   Obesity (BMI 30-39.9) 10/30/2015    Immunization History  Administered Date(s) Administered   Influenza,inj,Quad PF,6+ Mos 11/01/2019   Influenza-Unspecified 10/30/2020   Moderna Sars-Covid-2 Vaccination 05/13/2020, 06/23/2020, 07/12/2021   Pneumococcal Polysaccharide-23 12/18/2020   Td 12/30/2020   Zoster Recombinat (Shingrix) 12/30/2020, 05/29/2021    Conditions to be addressed/monitored: HLD and DMII  Care Plan : Medication Management  Updates made by Beryle Lathe, [place] since 12/18/2021 12:00 AM     Problem: Diabetes  and Hyperlipidemia   Priority: High  Onset Date: 10/10/2021     Long-Range Goal: Disease Progression Prevention   Start Date: 10/10/2021  Expected End Date: 01/08/2022  Recent Progress: On track  Priority: High  Note:   Current Barriers:  Unable to achieve control of diabetes and hyperlipidemia  Pharmacist Clinical Goal(s):  Patient will Achieve control of diabetes and hyperlipidemia as evidenced by improved fasting blood sugar, improved A1c, and improved triglycerides through collaboration with PharmD and provider.   Interventions: 1:1 collaboration with Lindell Spar, MD regarding development and update of comprehensive plan of care as evidenced by provider attestation and co-signature Inter-disciplinary care team collaboration (see longitudinal plan of care) Comprehensive medication review performed; medication list updated in electronic medical record  Type 2 Diabetes - Goal on Track (progressing): YES. (Followed by Dr. Loanne Drilling) Uncontrolled but improving; A1c 12.4>8.5 over last 3 months; Most recent A1c remains above goal of <7% per ADA guidelines Current medications: Lantus 65 units subcutaneously once daily (increased from 50 units daily by Dr. Loanne Drilling on 12/14/21), empagliflozin (Jardiance) 10 mg by mouth once daily, metformin 1,000 mg by mouth daily with supper, and glipizide IR 10 mg by mouth twice daily with meals Intolerances:  GI upset/drowsiness with metformin more than 1,000 mg/day; nausea and vomiting with Trulicity 3 mg weekly Taking medications as directed: yes Side effects thought to be attributed to current medication regimen: no Denies hypoglycemic symptoms (sweaty and shaky). Hypoglycemia prevention: not indicated at this time Current meal patterns:  patient is a vegetarian. Reports that she does not drink soda, juice, or milk. Does not add sugar to her coffee. Eats 3 meals a day. Eats 1-2 pieces of bread per day. Does not eat sweets. Current exercise:  walks  some but is generally not very active due to fatigue On a statin: yes On aspirin 81 mg daily: no Last microalbumin/creatinine ratio: 5 (12/18/20); on an ACEi/ARB: yes Last eye exam: completed within last year Last foot exam: completed within last year Pneumonia vaccine: up to date Influenza vaccine:  needs this fall Shingles: series complete Current glucose readings: See Dexcom report below for last 30 days. Significant improvement in blood glucose from prior visits. Time in range 33%. Time above range 66%. Time below range <1%. One hypoglycemia event yesterday (12/17/21) per sensor but suspect sensor malfunction since blood glucose went from 245>60 within 30 minutes. Doubt current medication regimen would have caused this. Patient was unable to validate with finger stick blood glucose. Patient instructed to use Dexcom G6 to monitor blood glucose unless her sensor glucose reading does not match her symptoms. The following education materials were previously given to the patient: carbohydrate guide, dining out with Diabetes, and Planning healthy meals Continue above medications per endocrinology.  May be reasonable to consider restarting Trulicity at a lower dose. Patient reports tolerating lower doses (0.75 mg and 1.5 mg) with some mild nausea but did not start having vomiting until she increased to 3 mg dose. Patient could benefit from the weight loss of GLP-1 agonist and is approved through [employer]. Patient instructed to work on reducing carbohydrate intake  Patient has shared Dexcom G6 data with our clinic. Will continue to review with patient at each visit.  Patient given number for [facility] to contact for refills of Dexcom G6 [phone])        Hyperlipidemia - Goal on Track (progressing): YES.: Uncontrolled but improving; LDL at goal of <70 due to very high risk given diabetes + at least 1 additional major risk factor (hypertension) per 2020 AACE/ACE guidelines.  Triglycerides above goal of <150 per 2020 AACE/ACE guidelines. Current medications: rosuvastatin 10 mg by mouth once daily Intolerances: none Taking medications as directed: yes Side effects thought to be attributed to current medication regimen: no Continue rosuvastatin 10 mg by mouth once daily Encourage dietary reduction of high fat containing foods such as butter, nuts, bacon, egg yolks, etc. Discussed need for and importance of continued work on weight loss Reviewed risks of hyperlipidemia, principles of treatment and consequences of untreated hyperlipidemia Discussed need for medication compliance Triglycerides will likely improve as blood glucose continues to improve.  Patient Goals/Self-Care Activities Patient will:  Take medications as prescribed Check blood sugar continuously with continuous glucose monitor, document, and provide at future appointments Collaborate with provider on medication access solutions Engage in dietary modifications by less frequent dining out and fewer sweetened foods & beverages  Follow Up Plan: Face to Face appointment with care management team member scheduled for: 01/16/22      Medication Assistance:  [place] for Trulicity although not currently taking  Patient's preferred pharmacy is:  [facility] [place] [address] [place] [address] Phone: [phone] Fax: [place], [place] [address] [place] [place] [address] Phone: [phone] Fax: [phone]  [facility] [id number] - [place], [place] [address] [place] [address] Phone: [phone] Fax: [phone]  [id number] [id number]: [phone]: [phone] Follow Up:  Patient agrees to Care Plan and  Follow-up.  Plan: Face to Face appointment with care management team member scheduled for: 01/16/22  CKennon Holter PharmD, BCumings CNordClinical Pharmacist Practitioner [facility] [phone]

## 2021-12-18 NOTE — Patient Instructions (Signed)
Sydney Taylor,  It was great to talk to you today!  Please call me with any questions or concerns.   Visit Information  Following are the goals we discussed today:  Patient Goals/Self-Care Activities Patient will:  Take medications as prescribed Check blood sugar continuously with continuous glucose monitor, document, and provide at future appointments Collaborate with provider on medication access solutions Engage in dietary modifications by less frequent dining out and fewer sweetened foods & beverages  Plan: Face to Face appointment with care management team member scheduled for: 01/16/22  Kennon Holter, PharmD, BCACP, CPP Clinical Pharmacist Practitioner [facility] [phone]  Please call the care guide team at [phone] if you need to cancel or reschedule your appointment.   Patient verbalizes understanding of instructions provided today and agrees to view in [place].

## 2021-12-19 ENCOUNTER — Encounter: Payer: Self-pay | Admitting: Internal Medicine

## 2021-12-20 ENCOUNTER — Ambulatory Visit: Payer: Medicare Other

## 2021-12-20 ENCOUNTER — Other Ambulatory Visit: Payer: Self-pay | Admitting: Internal Medicine

## 2021-12-21 ENCOUNTER — Ambulatory Visit: Payer: Self-pay

## 2021-12-22 ENCOUNTER — Ambulatory Visit: Payer: Medicare Other

## 2021-12-24 ENCOUNTER — Encounter: Payer: Self-pay | Admitting: Internal Medicine

## 2021-12-24 ENCOUNTER — Ambulatory Visit: Payer: Medicare Other

## 2021-12-25 ENCOUNTER — Encounter (HOSPITAL_COMMUNITY): Payer: Medicare Other

## 2021-12-25 ENCOUNTER — Ambulatory Visit (HOSPITAL_COMMUNITY): Payer: Medicare Other

## 2021-12-26 NOTE — Telephone Encounter (Signed)
Advised pt to call and see when teoh could see her as she stated this takes typically a week and half to get in and told her to let me know

## 2021-12-26 NOTE — Telephone Encounter (Signed)
Pt made appt 12-27-21 with patel

## 2021-12-27 ENCOUNTER — Ambulatory Visit (INDEPENDENT_AMBULATORY_CARE_PROVIDER_SITE_OTHER): Payer: Medicare Other | Admitting: Internal Medicine

## 2021-12-27 ENCOUNTER — Encounter: Payer: Self-pay | Admitting: Internal Medicine

## 2021-12-27 ENCOUNTER — Other Ambulatory Visit: Payer: Self-pay

## 2021-12-27 VITALS — BP 118/76 | HR 97 | Resp 18 | Ht 69.0 in | Wt 258.0 lb

## 2021-12-27 DIAGNOSIS — J3089 Other allergic rhinitis: Secondary | ICD-10-CM

## 2021-12-27 DIAGNOSIS — J0100 Acute maxillary sinusitis, unspecified: Secondary | ICD-10-CM | POA: Diagnosis not present

## 2021-12-27 LAB — CMP14+EGFR
ALT: 19 IU/L (ref 0–32)
AST: 18 IU/L (ref 0–40)
Albumin/Globulin Ratio: 2.1 (ref 1.2–2.2)
Albumin: 4.2 g/dL (ref 3.8–4.9)
Alkaline Phosphatase: 83 IU/L (ref 44–121)
BUN/Creatinine Ratio: 10 (ref 9–23)
BUN: 11 mg/dL (ref 6–24)
Bilirubin Total: 0.4 mg/dL (ref 0.0–1.2)
CO2: 24 mmol/L (ref 20–29)
Calcium: 9.5 mg/dL (ref 8.7–10.2)
Chloride: 102 mmol/L (ref 96–106)
Creatinine, Ser: 1.14 mg/dL — ABNORMAL HIGH (ref 0.57–1.00)
Globulin, Total: 2 g/dL (ref 1.5–4.5)
Glucose: 170 mg/dL — ABNORMAL HIGH (ref 70–99)
Potassium: 4.3 mmol/L (ref 3.5–5.2)
Sodium: 142 mmol/L (ref 134–144)
Total Protein: 6.2 g/dL (ref 6.0–8.5)
eGFR: 56 mL/min/{1.73_m2} — ABNORMAL LOW (ref >59)

## 2021-12-27 LAB — CBC WITH DIFFERENTIAL/PLATELET
Basophils Absolute: 0.1 10*3/uL (ref 0.0–0.2)
Basos: 1 %
EOS (ABSOLUTE): 0.4 10*3/uL (ref 0.0–0.4)
Eos: 4 %
Hematocrit: 45.1 % (ref 34.0–46.6)
Hemoglobin: 14.9 g/dL (ref 11.1–15.9)
Immature Grans (Abs): 0.1 10*3/uL (ref 0.0–0.1)
Immature Granulocytes: 1 %
Lymphocytes Absolute: 1.9 10*3/uL (ref 0.7–3.1)
Lymphs: 19 %
MCH: 31.2 pg (ref 26.6–33.0)
MCHC: 33 g/dL (ref 31.5–35.7)
MCV: 95 fL (ref 79–97)
Monocytes Absolute: 0.8 10*3/uL (ref 0.1–0.9)
Monocytes: 8 %
Neutrophils Absolute: 6.8 10*3/uL (ref 1.4–7.0)
Neutrophils: 67 %
Platelets: 357 10*3/uL (ref 150–450)
RBC: 4.77 x10E6/uL (ref 3.77–5.28)
RDW: 13.4 % (ref 11.7–15.4)
WBC: 9.9 10*3/uL (ref 3.4–10.8)

## 2021-12-27 LAB — URINALYSIS
Bilirubin, UA: NEGATIVE
Ketones, UA: NEGATIVE
Leukocytes,UA: NEGATIVE
Nitrite, UA: NEGATIVE
Protein,UA: NEGATIVE
RBC, UA: NEGATIVE
Specific Gravity, UA: 1.025 (ref 1.005–1.030)
Urobilinogen, Ur: 0.2 mg/dL (ref 0.2–1.0)
pH, UA: 6 (ref 5.0–7.5)

## 2021-12-27 LAB — HEMOGLOBIN A1C
Est. average glucose Bld gHb Est-mCnc: 197 mg/dL
Hgb A1c MFr Bld: 8.5 % — ABNORMAL HIGH (ref 4.8–5.6)

## 2021-12-27 LAB — LIPID PANEL
Chol/HDL Ratio: 3.8 ratio (ref 0.0–4.4)
Cholesterol, Total: 136 mg/dL (ref 100–199)
HDL: 36 mg/dL — ABNORMAL LOW (ref >39)
LDL Chol Calc (NIH): 68 mg/dL (ref 0–99)
Triglycerides: 189 mg/dL — ABNORMAL HIGH (ref 0–149)
VLDL Cholesterol Cal: 32 mg/dL (ref 5–40)

## 2021-12-27 LAB — VITAMIN D 1,25 DIHYDROXY
Vitamin D 1, 25 (OH)2 Total: 37 pg/mL
Vitamin D2 1, 25 (OH)2: <10 pg/mL
Vitamin D3 1, 25 (OH)2: 37 pg/mL

## 2021-12-27 LAB — TSH+FREE T4
Free T4: 0.95 ng/dL (ref 0.82–1.77)
TSH: 2.22 u[IU]/mL (ref 0.450–4.500)

## 2021-12-27 MED ORDER — AZITHROMYCIN 250 MG PO TABS: ORAL_TABLET | ORAL | 0 refills | Status: AC

## 2021-12-27 NOTE — Patient Instructions (Signed)
Please take Azithromycin as prescribed.  Please use humidifier/vaporizer as discussed.  You are being referred to [facility].

## 2021-12-28 ENCOUNTER — Other Ambulatory Visit (INDEPENDENT_AMBULATORY_CARE_PROVIDER_SITE_OTHER): Payer: Self-pay | Admitting: Endocrinology

## 2021-12-28 ENCOUNTER — Encounter: Payer: Self-pay | Admitting: Endocrinology

## 2021-12-28 DIAGNOSIS — E1100 Type 2 diabetes mellitus with hyperosmolarity without nonketotic hyperglycemic-hyperosmolar coma (NKHHC): Secondary | ICD-10-CM

## 2021-12-28 NOTE — Progress Notes (Signed)
Acute Office Visit  Subjective:    Patient ID: Sydney Taylor, female    DOB: 14-May-1966, 55 y.o.   MRN: [id number]  Chief Complaint  Patient presents with   Cough    Pt having lingering sick symptoms symptoms started 12-15-21 she is having cough congestion runny nose ear fullness and neck pain has taken antibiotic     HPI Patient is in today for complaint of persistent nasal congestion, cough and postnasal drip for about a month now.  She was given levofloxacin by her ENT specialist for acute sinusitis along with nasal saline rinse.  She has completed antibiotic course, but continues to have symptoms.  She also reports intermittent bilateral ear fullness.  She also noticed painful LAD in the neck, which has resolved now.  She denies any ear discharge or dizziness currently.  Past Medical History:  Diagnosis Date   Anemia    Anxiety    Arrhythmia    Bipolar depression ([place]) 12/28/2019   Chronic fatigue    Chronic headaches    Depression    Diabetes mellitus without complication (HCC)    Fibromyalgia    GERD (gastroesophageal reflux disease)    High cholesterol    HLD (hyperlipidemia)    Hypertension    Insect bite    Migraine    Prediabetes    Sleep apnea    Urinary incontinence     Past Surgical History:  Procedure Laterality Date   ESOPHAGOGASTRODUODENOSCOPY N/A 05/12/2020   Procedure: ESOPHAGOGASTRODUODENOSCOPY (EGD);  Surgeon: Daneil Dolin, MD;  [facility];  Service: Endoscopy;  Laterality: N/A;  11:15am   Joint fusion on foot Right    MALONEY DILATION N/A 05/12/2020   Procedure: MALONEY DILATION;  Surgeon: Daneil Dolin, MD;  [facility];  Service: Endoscopy;  Laterality: N/A;   NASAL SEPTUM SURGERY     SINOSCOPY      Family History  Problem Relation Age of Onset   Hypertension Father    Hyperlipidemia Father    Diabetes Father    Stroke Father    Depression Father    Anxiety disorder Father    Hypertension Mother     Hyperlipidemia Mother    Diabetes Mellitus II Mother    Congestive Heart Failure Mother    Heart attack Mother    Hypertension Sister    Diabetes Sister    Hypertension Brother    Autoimmune disease Brother    Diabetes Maternal Grandmother    Lupus Paternal Grandmother    Depression Paternal Grandmother    Anxiety disorder Paternal Grandmother    Depression Maternal Aunt    Anxiety disorder Maternal Aunt    Colon cancer Neg Hx    Esophageal cancer Neg Hx    Gastric cancer Neg Hx     Social History   Socioeconomic History   Marital status: Married    Spouse name: Not on file   Number of children: 0   Years of education: Not on file   Highest education level: Bachelor's degree (e.g., BA, AB, BS)  Occupational History   Occupation: research associate/drug development  Tobacco Use   Smoking status: Former    Types: Cigarettes    Quit date: 12/30/1982    Years since quitting: 39.0   Smokeless tobacco: Never  Vaping Use   Vaping Use: Never used  Substance and Sexual Activity   Alcohol use: Not Currently    Alcohol/week: 0.0 standard drinks   Drug use: Never   Sexual  activity: Yes    Birth control/protection: Post-menopausal  Other Topics Concern   Not on file  Social History Narrative   Married for 3 years.Lives with husband.Bachelors in Education officer, museum.   Right handed   Caffeine: 1-2 per day    Social Determinants of Health   Financial Resource Strain: Not on file  Food Insecurity: No Food Insecurity   Worried About Charity fundraiser in the Last Year: Never true   Ran Out of Food in the Last Year: Never true  Transportation Needs: No Transportation Needs   Lack of Transportation (Medical): No   Lack of Transportation (Non-Medical): No  Physical Activity: Not on file  Stress: Not on file  Social Connections: Not on file  Intimate Partner Violence: Not on file    Outpatient Medications Prior to Visit  Medication Sig Dispense Refill   ALPRAZolam (XANAX) 0.5 MG  tablet Take 0.5 mg by mouth daily as needed.     blood glucose meter kit and supplies KIT Inject 1 each into the skin daily. Check blood glucose once a day. Diagnosis code: E 11.9. 1 each 0   buPROPion (WELLBUTRIN SR) 150 MG 12 hr tablet Take 150 mg by mouth daily.     chlorthalidone (HYGROTON) 25 MG tablet Take 0.5 tablets (12.5 mg total) by mouth daily. 45 tablet 1   Cholecalciferol (VITAMIN D3) 125 MCG (5000 UT) TABS Take 2 tablets (10,000 Units total) by mouth daily. 180 tablet 1   Continuous Blood Gluc Sensor (DEXCOM G6 SENSOR) MISC by Does not apply route.     Continuous Blood Gluc Transmit (DEXCOM G6 TRANSMITTER) MISC by Does not apply route.     Cyanocobalamin (VITAMIN B12) 1000 MCG TBCR Take 1,000 mcg by mouth daily.     DULoxetine (CYMBALTA) 60 MG capsule Take 60 mg by mouth daily.     fluconazole (DIFLUCAN) 150 MG tablet Take 150 mg by mouth once as needed.     glipiZIDE (GLUCOTROL) 10 MG tablet TAKE 1 TABLET BY MOUTH TWICE DAILY BEFORE A MEAL 180 tablet 0   glucose blood (ACCU-CHEK GUIDE) test strip 1 each by Other route in the morning, at noon, and at bedtime. Use as instructed 300 each 3   insulin glargine (LANTUS SOLOSTAR) 100 UNIT/ML Solostar Pen Inject 65 Units into the skin every morning. And pen needles 1/day 75 mL PRN   Insulin Pen Needle (PEN NEEDLES) 32G X 4 MM MISC Check BS 2 x a day 100 each 5   JARDIANCE 10 MG TABS tablet TAKE 1 TABLET(10 MG) BY MOUTH DAILY BEFORE BREAKFAST 30 tablet 2   ketoconazole (NIZORAL) 2 % shampoo Apply topically.     KLOR-CON M20 20 MEQ tablet TAKE 2 TABLETS BY MOUTH 2 TIMES DAILY. 60 tablet 1   L-Methylfolate 15 MG TABS Take 15 mg by mouth daily at 6 (six) AM.     losartan (COZAAR) 25 MG tablet Take 1 tablet (25 mg total) by mouth daily. 90 tablet 0   metFORMIN (GLUCOPHAGE-XR) 500 MG 24 hr tablet Take 2 tablets (1,000 mg total) by mouth daily with breakfast. (Patient taking differently: Take 1,000 mg by mouth daily with supper.) 180 tablet 3    metoCLOPramide (REGLAN) 5 MG tablet Take 1 tablet (5 mg total) by mouth every 8 (eight) hours as needed for nausea. 30 tablet 0   metoprolol tartrate (LOPRESSOR) 50 MG tablet Take 1 tablet (50 mg total) by mouth 2 (two) times daily. No more refills will be approved by this  provider as office is now permanently closed, patient will need to find another primary care provider. 180 tablet 0   Multiple Vitamins-Minerals (MULTIVITAMIN WOMEN 50+) TABS Take 1 tablet by mouth daily.     Pantoprazole Sodium (PROTONIX PO) Take 40 mg by mouth daily.     pregabalin (LYRICA) 75 MG capsule Take 75 mg by mouth at bedtime.     promethazine-dextromethorphan (PROMETHAZINE-DM) 6.25-15 MG/5ML syrup Take 5 mLs by mouth 4 (four) times daily as needed for cough. 118 mL 0   rosuvastatin (CRESTOR) 10 MG tablet Take 1 tablet (10 mg total) by mouth daily. 30 tablet 11   Saccharomyces boulardii (PROBIOTIC) 250 MG CAPS Take 1 capsule by mouth daily.     SUMAtriptan (IMITREX) 50 MG tablet MAY REPEAT IN 2 HOURS IF HEADACHE PERSISTS OR RECURS. MAXIMUM 2 PER DAY. 10 tablet 0   traZODone (DESYREL) 50 MG tablet Take 50 mg by mouth at bedtime as needed.     amoxicillin-clavulanate (AUGMENTIN) 875-125 MG tablet Take 1 tablet by mouth 2 (two) times daily. (Patient not taking: Reported on 12/27/2021) 14 tablet 0   No facility-administered medications prior to visit.    Allergies  Allergen Reactions   Cephalexin Itching, Swelling and Other (See Comments)    Review of Systems  HENT:  Positive for ear pain, postnasal drip, sinus pressure and sinus pain. Negative for ear discharge.   Respiratory:  Positive for cough. Negative for shortness of breath.   Gastrointestinal:  Negative for diarrhea and vomiting.  Genitourinary:  Negative for dysuria and hematuria.  Musculoskeletal:  Negative for neck pain and neck stiffness.  Skin:  Negative for rash.  Neurological:  Negative for dizziness and weakness.  Psychiatric/Behavioral:  Negative  for agitation and behavioral problems.       Objective:    Physical Exam Constitutional:      General: She is not in acute distress.    Appearance: She is obese. She is not diaphoretic.  HENT:     Head: Normocephalic and atraumatic.     Nose: Congestion present.     Right Sinus: Maxillary sinus tenderness present.     Left Sinus: Maxillary sinus tenderness present.     Mouth/Throat:     Mouth: Mucous membranes are dry.     Pharynx: No posterior oropharyngeal erythema.  Eyes:     General: No scleral icterus.    Extraocular Movements: Extraocular movements intact.  Cardiovascular:     Rate and Rhythm: Normal rate and regular rhythm.     Pulses: Normal pulses.     Heart sounds: Normal heart sounds. No murmur heard. Pulmonary:     Breath sounds: Normal breath sounds. No wheezing or rales.  Abdominal:     Palpations: Abdomen is soft.     Tenderness: There is no abdominal tenderness.  Musculoskeletal:     Cervical back: Neck supple. No tenderness.     Right lower leg: Edema (Trace) present.     Left lower leg: Edema (Trace) present.  Skin:    General: Skin is dry.     Coloration: Skin is not pale.     Findings: No rash.  Neurological:     General: No focal deficit present.     Mental Status: She is alert and oriented to person, place, and time.     Sensory: No sensory deficit.     Motor: No weakness.  Psychiatric:        Mood and Affect: Mood is depressed. Affect is flat.  Behavior: Behavior normal.        Thought Content: Thought content normal.    BP 118/76 (BP Location: Right Arm, Patient Position: Sitting, Cuff Size: Normal)    Pulse 97    Resp 18    Ht 5' 9"  (1.753 m)    Wt 258 lb (117 kg)    LMP  (LMP Unknown)    SpO2 98%    BMI 38.10 kg/m  Wt Readings from Last 3 Encounters:  12/27/21 258 lb (117 kg)  12/14/21 252 lb (114.3 kg)  10/11/21 253 lb (114.8 kg)        Assessment & Plan:   Problem List Items Addressed This Visit       Respiratory    Perennial allergic rhinitis - Primary    Uses Flonase Has been seen by ENT specialist        Other   Morbid obesity ([place])    Advised to follow low-carb diet and moderate exercise/walking as tolerated On Trulicity Referred to weight loss clinic as per patient request      Relevant Orders   Amb Ref to Medical Weight Management   Other Visit Diagnoses     Acute non-recurrent maxillary sinusitis     Prescribed azithromycin Would avoid prolonged fluoroquinolone Continue Flonase for allergies Nasal saline rinse as advised by ENT  Advised to use humidifier/vaporizer at bedtime   Relevant Medications   azithromycin (ZITHROMAX) 250 MG tablet        Meds ordered this encounter  Medications   azithromycin (ZITHROMAX) 250 MG tablet    Sig: Take 2 tablets on day 1, then 1 tablet daily on days 2 through 5    Dispense:  6 tablet    Refill:  0     Sixto Bowdish Keith Rake, MD

## 2021-12-28 NOTE — Assessment & Plan Note (Signed)
Advised to follow low-carb diet and moderate exercise/walking as tolerated On Trulicity Referred to weight loss clinic as per patient request

## 2021-12-28 NOTE — Assessment & Plan Note (Signed)
Uses Flonase Has been seen by ENT specialist

## 2021-12-29 DIAGNOSIS — E1165 Type 2 diabetes mellitus with hyperglycemia: Secondary | ICD-10-CM | POA: Diagnosis not present

## 2021-12-29 DIAGNOSIS — E782 Mixed hyperlipidemia: Secondary | ICD-10-CM

## 2022-01-04 ENCOUNTER — Other Ambulatory Visit: Payer: Self-pay | Admitting: Internal Medicine

## 2022-01-07 ENCOUNTER — Other Ambulatory Visit: Payer: Self-pay

## 2022-01-07 ENCOUNTER — Ambulatory Visit
Admission: RE | Admit: 2022-01-07 | Discharge: 2022-01-07 | Disposition: A | Discharge status: Home / Self Care | Payer: Medicare Other | Source: Ambulatory Visit | Attending: Family Medicine | Admitting: Family Medicine

## 2022-01-07 VITALS — BP 127/81 | HR 95 | Temp 98.5°F | Resp 18

## 2022-01-07 DIAGNOSIS — J329 Chronic sinusitis, unspecified: Secondary | ICD-10-CM

## 2022-01-07 DIAGNOSIS — B9789 Other viral agents as the cause of diseases classified elsewhere: Secondary | ICD-10-CM

## 2022-01-07 DIAGNOSIS — I8391 Asymptomatic varicose veins of right lower extremity: Secondary | ICD-10-CM

## 2022-01-07 DIAGNOSIS — J3089 Other allergic rhinitis: Secondary | ICD-10-CM

## 2022-01-07 MED ORDER — CETIRIZINE HCL 10 MG PO TABS: 10.0000 mg | ORAL_TABLET | Freq: Every day | ORAL | 1 refills | Status: DC

## 2022-01-07 NOTE — ED Provider Notes (Signed)
[facility]    CSN: [id number] Arrival date & time: 01/07/22  0951      History   Chief Complaint Chief Complaint  Patient presents with   Cough   Sore Throat   HPI Sydney Taylor is a 56 y.o. female.   Presenting today with about 2 weeks of ongoing fatigue, body aches, cough, sore throat, sinus pressure.  Has tested negative for COVID numerous times since onset though her husband tested positive near onset of symptoms.  She started Levaquin last week for a sinus infection in addition to nasal rinses, nasal spray and was feeling a bit better until 3 days ago when her symptoms worsened again.  She denies fever, chest pain, shortness of breath, abdominal pain, nausea vomiting or diarrhea.  No new sick contacts recently.  Does have a history of allergic rhinitis not currently on an antihistamine.  She also has a protruding vein on the right upper thigh medially that she would like checked out to make sure it is a varicose vein.  She denies any pain, discoloration, numbness, tingling to the area.  Past Medical History:  Diagnosis Date   Anemia    Anxiety    Arrhythmia    Bipolar depression ([place]) 12/28/2019   Chronic fatigue    Chronic headaches    Depression    Diabetes mellitus without complication (HCC)    Fibromyalgia    GERD (gastroesophageal reflux disease)    High cholesterol    HLD (hyperlipidemia)    Hypertension    Insect bite    Migraine    Prediabetes    Sleep apnea    Urinary incontinence     Patient Active Problem List   Diagnosis Date Noted   Chronic migraine without aura without status migrainosus, not intractable 10/29/2021   Diabetic polyneuropathy associated with type 2 diabetes mellitus ([place]) 09/19/2021   History of hormone therapy 09/19/2021   Bilateral primary osteoarthritis of knee 04/10/2021   Perennial allergic rhinitis 04/06/2021   Allergic contact dermatitis 04/06/2021   Cholinergic urticaria 04/06/2021   Fibromyalgia syndrome  01/12/2021   Elevated C-reactive protein (CRP) 01/12/2021   Diverticulosis of colon without hemorrhage 01/02/2021   Major depressive disorder, single episode, severe ([place]) 07/14/2020   GERD (gastroesophageal reflux disease) 05/11/2020   Type 2 diabetes mellitus ([place]) 03/27/2020   Bipolar depression ([place]) 12/28/2019   Sleep apnea 12/28/2019   Hypokalemia 10/31/2019   Tachycardia 10/31/2019   Hyperlipidemia 10/30/2015   Morbid obesity ([place]) 10/30/2015   Past Surgical History:  Procedure Laterality Date   ESOPHAGOGASTRODUODENOSCOPY N/A 05/12/2020   Procedure: ESOPHAGOGASTRODUODENOSCOPY (EGD);  Surgeon: Daneil Dolin, MD;  [facility];  Service: Endoscopy;  Laterality: N/A;  11:15am   Joint fusion on foot Right    MALONEY DILATION N/A 05/12/2020   Procedure: MALONEY DILATION;  Surgeon: Daneil Dolin, MD;  [facility];  Service: Endoscopy;  Laterality: N/A;   NASAL SEPTUM SURGERY     SINOSCOPY     OB History     Gravida  0   Para  0   Term  0   Preterm  0   AB  0   Living  0      SAB  0   IAB  0   Ectopic  0   Multiple  0   Live Births  0            Home Medications    Prior to Admission medications  Medication Sig Start Date End Date Taking? Authorizing Provider  cetirizine (ZYRTEC ALLERGY) 10 MG tablet Take 1 tablet (10 mg total) by mouth daily. 01/07/22  Yes Volney American, PA-C  ALPRAZolam Duanne Moron) 0.5 MG tablet Take 0.5 mg by mouth daily as needed. 07/05/20   [provider]  blood glucose meter kit and supplies KIT Inject 1 each into the skin daily. Check blood glucose once a day. Diagnosis code: E 11.9. 02/21/21   Hurshel Party C, MD  buPROPion (WELLBUTRIN SR) 150 MG 12 hr tablet Take 150 mg by mouth daily.    [provider]  chlorthalidone (HYGROTON) 25 MG tablet Take 0.5 tablets (12.5 mg total) by mouth daily. 11/08/21   Lindell Spar, MD  Cholecalciferol (VITAMIN D3) 125 MCG (5000 UT) TABS Take 2  tablets (10,000 Units total) by mouth daily. 01/22/21   Doree Albee, MD  Continuous Blood Gluc Sensor (DEXCOM G6 SENSOR) MISC by Does not apply route.    [provider]  Continuous Blood Gluc Transmit (DEXCOM G6 TRANSMITTER) MISC by Does not apply route.    [provider]  Cyanocobalamin (VITAMIN B12) 1000 MCG TBCR Take 1,000 mcg by mouth daily.    [provider]  DULoxetine (CYMBALTA) 60 MG capsule Take 60 mg by mouth daily. 05/14/21   [provider]  fluconazole (DIFLUCAN) 150 MG tablet Take 150 mg by mouth once as needed. 11/05/21   [provider]  glipiZIDE (GLUCOTROL) 10 MG tablet TAKE 1 TABLET BY MOUTH TWICE DAILY BEFORE A MEAL 01/01/22   Renato Shin, MD  glucose blood (ACCU-CHEK GUIDE) test strip 1 each by Other route in the morning, at noon, and at bedtime. Use as instructed 08/07/21   Ailene Ards, NP  insulin glargine (LANTUS SOLOSTAR) 100 UNIT/ML Solostar Pen Inject 65 Units into the skin every morning. And pen needles 1/day 12/14/21   Renato Shin, MD  Insulin Pen Needle (PEN NEEDLES) 32G X 4 MM MISC Check BS 2 x a day 10/22/21   Renato Shin, MD  JARDIANCE 10 MG TABS tablet TAKE 1 TABLET(10 MG) BY MOUTH DAILY BEFORE BREAKFAST 12/17/21   Lindell Spar, MD  ketoconazole (NIZORAL) 2 % shampoo Apply topically. 11/05/21   [provider]  KLOR-CON M20 20 MEQ tablet TAKE 2 TABLETS BY MOUTH TWICE A DAY 01/04/22   Lindell Spar, MD  L-Methylfolate 15 MG TABS Take 15 mg by mouth daily at 6 (six) AM.    [provider]  losartan (COZAAR) 25 MG tablet Take 1 tablet (25 mg total) by mouth daily. 10/15/21 01/13/22  Lindell Spar, MD  metFORMIN (GLUCOPHAGE-XR) 500 MG 24 hr tablet Take 2 tablets (1,000 mg total) by mouth daily with breakfast. Patient taking differently: Take 1,000 mg by mouth daily with supper. 10/11/21   Renato Shin, MD  metoCLOPramide (REGLAN) 5 MG tablet Take 1 tablet (5 mg total) by mouth every 8 (eight)  hours as needed for nausea. 10/29/21   Lindell Spar, MD  metoprolol tartrate (LOPRESSOR) 50 MG tablet Take 1 tablet (50 mg total) by mouth 2 (two) times daily. No more refills will be approved by this provider as office is now permanently closed, patient will need to find another primary care provider. 12/12/21   Lindell Spar, MD  Multiple Vitamins-Minerals (MULTIVITAMIN WOMEN 50+) TABS Take 1 tablet by mouth daily.    [provider]  Pantoprazole Sodium (PROTONIX PO) Take 40 mg by mouth daily.  [provider]  pregabalin (LYRICA) 75 MG capsule Take 75 mg by mouth at bedtime. 01/26/21   [provider]  promethazine-dextromethorphan (PROMETHAZINE-DM) 6.25-15 MG/5ML syrup Take 5 mLs by mouth 4 (four) times daily as needed for cough. 12/15/21   Hassell Done, Mary-Margaret, FNP  rosuvastatin (CRESTOR) 10 MG tablet Take 1 tablet (10 mg total) by mouth daily. 11/07/21   Lindell Spar, MD  Saccharomyces boulardii (PROBIOTIC) 250 MG CAPS Take 1 capsule by mouth daily.    [provider]  SUMAtriptan (IMITREX) 50 MG tablet MAY REPEAT IN 2 HOURS IF HEADACHE PERSISTS OR RECURS. MAXIMUM 2 PER DAY. 11/28/21   Lindell Spar, MD  traZODone (DESYREL) 50 MG tablet Take 50 mg by mouth at bedtime as needed. 10/08/21   [provider]    Family History Family History  Problem Relation Age of Onset   Hypertension Father    Hyperlipidemia Father    Diabetes Father    Stroke Father    Depression Father    Anxiety disorder Father    Hypertension Mother    Hyperlipidemia Mother    Diabetes Mellitus II Mother    Congestive Heart Failure Mother    Heart attack Mother    Hypertension Sister    Diabetes Sister    Hypertension Brother    Autoimmune disease Brother    Diabetes Maternal Grandmother    Lupus Paternal Grandmother    Depression Paternal Grandmother    Anxiety disorder Paternal Grandmother    Depression Maternal Aunt    Anxiety disorder Maternal  Aunt    Colon cancer Neg Hx    Esophageal cancer Neg Hx    Gastric cancer Neg Hx    Social History Social History   Tobacco Use   Smoking status: Former    Types: Cigarettes    Quit date: 12/30/1982    Years since quitting: 39.0   Smokeless tobacco: Never  Vaping Use   Vaping Use: Never used  Substance Use Topics   Alcohol use: Not Currently    Alcohol/week: 0.0 standard drinks   Drug use: Never    Allergies   Cephalexin   Review of Systems Review of Systems Per HPI  Physical Exam Triage Vital Signs ED Triage Vitals  Enc Vitals Group     BP 01/07/22 1030 127/81     Pulse Rate 01/07/22 1030 95     Resp 01/07/22 1030 18     Temp 01/07/22 1030 98.5 F (36.9 C)     Temp src --      SpO2 01/07/22 1030 96 %     Weight --      Height --      Head Circumference --      Peak Flow --      Pain Score 01/07/22 1028 3     Pain Loc --      Pain Edu? --      Excl. in [place]? --    No data found.  Updated Vital Signs BP 127/81    Pulse 95    Temp 98.5 F (36.9 C)    Resp 18    LMP  (LMP Unknown)    SpO2 96%   Visual Acuity Right Eye Distance:   Left Eye Distance:   Bilateral Distance:    Right Eye Near:   Left Eye Near:    Bilateral Near:     Physical Exam Vitals and nursing note reviewed.  Constitutional:      Appearance: Normal  appearance.  HENT:     Head: Atraumatic.     Right Ear: Tympanic membrane and external ear normal.     Left Ear: Tympanic membrane and external ear normal.     Nose: Rhinorrhea present.     Mouth/Throat:     Mouth: Mucous membranes are moist.     Pharynx: Posterior oropharyngeal erythema present.  Eyes:     Extraocular Movements: Extraocular movements intact.     Conjunctiva/sclera: Conjunctivae normal.  Cardiovascular:     Rate and Rhythm: Normal rate and regular rhythm.     Heart sounds: Normal heart sounds.     Comments: Small varicose vein right upper medial thigh.  Nontender to palpation Pulmonary:     Effort: Pulmonary  effort is normal.     Breath sounds: Normal breath sounds. No wheezing or rales.  Musculoskeletal:        General: Normal range of motion.     Cervical back: Normal range of motion and neck supple.  Skin:    General: Skin is warm and dry.  Neurological:     Mental Status: She is alert and oriented to person, place, and time.     Motor: No weakness.     Gait: Gait normal.  Psychiatric:        Mood and Affect: Mood normal.        Thought Content: Thought content normal.   UC Treatments / Results  Labs (all labs ordered are listed, but only abnormal results are displayed) Labs Reviewed  COVID-19, FLU A+B NAA    EKG   Radiology No results found.  Procedures Procedures (including critical care time)  Medications Ordered in UC Medications - No data to display  Initial Impression / Assessment and Plan / UC Course  I have reviewed the triage vital signs and the nursing notes.  Pertinent labs & imaging results that were available during my care of the patient were reviewed by me and considered in my medical decision making (see chart for details).     Vital signs benign and reassuring, exam suspicious for poorly controlled seasonal allergies causing some sinusitis symptoms.  Already on Levaquin, rinses and will add Zyrtec daily, Flonase twice daily and continue sinus rinses and decongestants.  COVID and flu test pending for further rule out.  Return for acutely worsening symptoms.  Reassurance given with varicose vein and supportive care reviewed.  Final Clinical Impressions(s) / UC Diagnoses   Final diagnoses:  Seasonal allergic rhinitis due to other allergic trigger  Viral sinusitis  Asymptomatic varicose veins of right lower extremity   Discharge Instructions   None    ED Prescriptions     Medication Sig Dispense Auth. Provider   cetirizine (ZYRTEC ALLERGY) 10 MG tablet Take 1 tablet (10 mg total) by mouth daily. 30 tablet Volney American, [place]      PDMP  not reviewed this encounter.   Volney American, [place] 01/07/22 1059

## 2022-01-07 NOTE — ED Triage Notes (Signed)
Pt presents with cough and sore throat that began Friday, was treated for sinus infection a week prior

## 2022-01-07 NOTE — ED Triage Notes (Signed)
Pt also states she has area on right upper leg that she would like confirmed if varicose vein

## 2022-01-08 LAB — COVID-19, FLU A+B NAA
Influenza A, NAA: NOT DETECTED
Influenza B, NAA: NOT DETECTED
SARS-CoV-2, NAA: NOT DETECTED

## 2022-01-10 ENCOUNTER — Encounter: Payer: Self-pay | Admitting: Endocrinology

## 2022-01-11 ENCOUNTER — Ambulatory Visit: Payer: Self-pay

## 2022-01-12 ENCOUNTER — Other Ambulatory Visit: Payer: Self-pay | Admitting: Internal Medicine

## 2022-01-12 ENCOUNTER — Ambulatory Visit: Payer: Self-pay

## 2022-01-14 ENCOUNTER — Encounter: Payer: Self-pay | Admitting: Internal Medicine

## 2022-01-14 ENCOUNTER — Other Ambulatory Visit: Payer: Self-pay

## 2022-01-14 ENCOUNTER — Ambulatory Visit (INDEPENDENT_AMBULATORY_CARE_PROVIDER_SITE_OTHER): Payer: Medicare Other | Admitting: Internal Medicine

## 2022-01-14 VITALS — BP 132/84 | HR 107 | Resp 18 | Ht 69.0 in | Wt 260.0 lb

## 2022-01-14 DIAGNOSIS — R079 Chest pain, unspecified: Secondary | ICD-10-CM | POA: Diagnosis not present

## 2022-01-14 NOTE — Progress Notes (Signed)
Acute Office Visit  Subjective:    Patient ID: Sydney Taylor, female    DOB: 06/10/1966, 56 y.o.   MRN: [id number]  Chief Complaint  Patient presents with   Chest Pain    Pt had chest pain 01-12-22 it is better today was concerned both parents died of heart attack     HPI Patient is in today for c/o chest pain for last 2 days, which is substernal, intermittent and nonradiating. It is not related to activity, but does report dyspnea upon walking uphill. She denies any dizziness or palpitations currently. She denies any change in pain with eating. She takes Pantoprazole for GERD. Her father had CAD in his 12s. She has seen Cardiology in the past, but has not had stress test.  Past Medical History:  Diagnosis Date   Anemia    Anxiety    Arrhythmia    Bipolar depression ([place]) 12/28/2019   Chronic fatigue    Chronic headaches    Depression    Diabetes mellitus without complication (HCC)    Fibromyalgia    GERD (gastroesophageal reflux disease)    High cholesterol    HLD (hyperlipidemia)    Hypertension    Insect bite    Migraine    Prediabetes    Sleep apnea    Urinary incontinence     Past Surgical History:  Procedure Laterality Date   ESOPHAGOGASTRODUODENOSCOPY N/A 05/12/2020   Procedure: ESOPHAGOGASTRODUODENOSCOPY (EGD);  Surgeon: Daneil Dolin, MD;  [facility];  Service: Endoscopy;  Laterality: N/A;  11:15am   Joint fusion on foot Right    MALONEY DILATION N/A 05/12/2020   Procedure: MALONEY DILATION;  Surgeon: Daneil Dolin, MD;  [facility];  Service: Endoscopy;  Laterality: N/A;   NASAL SEPTUM SURGERY     SINOSCOPY      Family History  Problem Relation Age of Onset   Hypertension Father    Hyperlipidemia Father    Diabetes Father    Stroke Father    Depression Father    Anxiety disorder Father    Hypertension Mother    Hyperlipidemia Mother    Diabetes Mellitus II Mother    Congestive Heart Failure Mother    Heart attack  Mother    Hypertension Sister    Diabetes Sister    Hypertension Brother    Autoimmune disease Brother    Diabetes Maternal Grandmother    Lupus Paternal Grandmother    Depression Paternal Grandmother    Anxiety disorder Paternal Grandmother    Depression Maternal Aunt    Anxiety disorder Maternal Aunt    Colon cancer Neg Hx    Esophageal cancer Neg Hx    Gastric cancer Neg Hx     Social History   Socioeconomic History   Marital status: Married    Spouse name: Not on file   Number of children: 0   Years of education: Not on file   Highest education level: Bachelor's degree (e.g., BA, AB, BS)  Occupational History   Occupation: research associate/drug development  Tobacco Use   Smoking status: Former    Types: Cigarettes    Quit date: 12/30/1982    Years since quitting: 39.0   Smokeless tobacco: Never  Vaping Use   Vaping Use: Never used  Substance and Sexual Activity   Alcohol use: Not Currently    Alcohol/week: 0.0 standard drinks   Drug use: Never   Sexual activity: Yes    Birth control/protection: Post-menopausal  Other Topics  Concern   Not on file  Social History Narrative   Married for 3 years.Lives with husband.Bachelors in Education officer, museum.   Right handed   Caffeine: 1-2 per day    Social Determinants of Health   Financial Resource Strain: Not on file  Food Insecurity: No Food Insecurity   Worried About Charity fundraiser in the Last Year: Never true   Ran Out of Food in the Last Year: Never true  Transportation Needs: No Transportation Needs   Lack of Transportation (Medical): No   Lack of Transportation (Non-Medical): No  Physical Activity: Not on file  Stress: Not on file  Social Connections: Not on file  Intimate Partner Violence: Not on file    Outpatient Medications Prior to Visit  Medication Sig Dispense Refill   ALPRAZolam (XANAX) 0.5 MG tablet Take 0.5 mg by mouth daily as needed.     blood glucose meter kit and supplies KIT Inject 1 each into  the skin daily. Check blood glucose once a day. Diagnosis code: E 11.9. 1 each 0   buPROPion (WELLBUTRIN SR) 150 MG 12 hr tablet Take 150 mg by mouth daily.     cetirizine (ZYRTEC ALLERGY) 10 MG tablet Take 1 tablet (10 mg total) by mouth daily. 30 tablet 1   chlorthalidone (HYGROTON) 25 MG tablet Take 0.5 tablets (12.5 mg total) by mouth daily. 45 tablet 1   Cholecalciferol (VITAMIN D3) 125 MCG (5000 UT) TABS Take 2 tablets (10,000 Units total) by mouth daily. 180 tablet 1   Continuous Blood Gluc Sensor (DEXCOM G6 SENSOR) MISC by Does not apply route.     Continuous Blood Gluc Transmit (DEXCOM G6 TRANSMITTER) MISC by Does not apply route.     Cyanocobalamin (VITAMIN B12) 1000 MCG TBCR Take 1,000 mcg by mouth daily.     DULoxetine (CYMBALTA) 60 MG capsule Take 60 mg by mouth daily.     glipiZIDE (GLUCOTROL) 10 MG tablet TAKE 1 TABLET BY MOUTH TWICE DAILY BEFORE A MEAL 180 tablet 0   glucose blood (ACCU-CHEK GUIDE) test strip 1 each by Other route in the morning, at noon, and at bedtime. Use as instructed 300 each 3   insulin glargine (LANTUS SOLOSTAR) 100 UNIT/ML Solostar Pen Inject 65 Units into the skin every morning. And pen needles 1/day 75 mL PRN   Insulin Pen Needle (PEN NEEDLES) 32G X 4 MM MISC Check BS 2 x a day 100 each 5   JARDIANCE 10 MG TABS tablet TAKE 1 TABLET(10 MG) BY MOUTH DAILY BEFORE BREAKFAST 30 tablet 2   ketoconazole (NIZORAL) 2 % shampoo Apply topically.     KLOR-CON M20 20 MEQ tablet TAKE 2 TABLETS BY MOUTH TWICE A DAY 60 tablet 1   L-Methylfolate 15 MG TABS Take 15 mg by mouth daily at 6 (six) AM.     losartan (COZAAR) 25 MG tablet TAKE 1 TABLET(25 MG) BY MOUTH DAILY 90 tablet 0   metFORMIN (GLUCOPHAGE-XR) 500 MG 24 hr tablet Take 2 tablets (1,000 mg total) by mouth daily with breakfast. (Patient taking differently: Take 1,000 mg by mouth daily with supper.) 180 tablet 3   metoCLOPramide (REGLAN) 5 MG tablet Take 1 tablet (5 mg total) by mouth every 8 (eight) hours as  needed for nausea. 30 tablet 0   metoprolol tartrate (LOPRESSOR) 50 MG tablet Take 1 tablet (50 mg total) by mouth 2 (two) times daily. No more refills will be approved by this provider as office is now permanently closed, patient will need  to find another primary care provider. 180 tablet 0   Multiple Vitamins-Minerals (MULTIVITAMIN WOMEN 50+) TABS Take 1 tablet by mouth daily.     Pantoprazole Sodium (PROTONIX PO) Take 40 mg by mouth daily.     pregabalin (LYRICA) 75 MG capsule Take 75 mg by mouth at bedtime.     promethazine-dextromethorphan (PROMETHAZINE-DM) 6.25-15 MG/5ML syrup Take 5 mLs by mouth 4 (four) times daily as needed for cough. 118 mL 0   rosuvastatin (CRESTOR) 10 MG tablet Take 1 tablet (10 mg total) by mouth daily. 30 tablet 11   Saccharomyces boulardii (PROBIOTIC) 250 MG CAPS Take 1 capsule by mouth daily.     SUMAtriptan (IMITREX) 50 MG tablet MAY REPEAT IN 2 HOURS IF HEADACHE PERSISTS OR RECURS. MAXIMUM 2 PER DAY. 10 tablet 0   traZODone (DESYREL) 50 MG tablet Take 50 mg by mouth at bedtime as needed.     fluconazole (DIFLUCAN) 150 MG tablet Take 150 mg by mouth once as needed. (Patient not taking: Reported on 01/14/2022)     No facility-administered medications prior to visit.    Allergies  Allergen Reactions   Cephalexin Itching, Swelling and Other (See Comments)    Review of Systems  Constitutional:  Negative for chills and fever.  HENT:  Positive for postnasal drip, sinus pressure and sinus pain. Negative for ear discharge.   Respiratory:  Positive for shortness of breath. Negative for wheezing.   Cardiovascular:  Positive for chest pain. Negative for palpitations.  Gastrointestinal:  Negative for diarrhea and vomiting.  Genitourinary:  Negative for dysuria and hematuria.  Musculoskeletal:  Negative for neck pain and neck stiffness.  Skin:  Negative for rash.  Neurological:  Negative for dizziness and weakness.  Psychiatric/Behavioral:  Negative for agitation and  behavioral problems.       Objective:    Physical Exam Constitutional:      General: She is not in acute distress.    Appearance: She is obese. She is not diaphoretic.  HENT:     Head: Normocephalic and atraumatic.     Nose: Congestion present.     Mouth/Throat:     Mouth: Mucous membranes are dry.     Pharynx: No posterior oropharyngeal erythema.  Eyes:     General: No scleral icterus.    Extraocular Movements: Extraocular movements intact.  Cardiovascular:     Rate and Rhythm: Normal rate and regular rhythm.     Pulses: Normal pulses.     Heart sounds: Normal heart sounds. No murmur heard. Pulmonary:     Breath sounds: Normal breath sounds. No wheezing or rales.  Musculoskeletal:     Cervical back: Neck supple. No tenderness.     Right lower leg: Edema (Trace) present.     Left lower leg: Edema (Trace) present.  Skin:    General: Skin is dry.     Coloration: Skin is not pale.     Findings: No rash.  Neurological:     General: No focal deficit present.     Mental Status: She is alert and oriented to person, place, and time.     Sensory: No sensory deficit.     Motor: No weakness.  Psychiatric:        Mood and Affect: Mood is depressed. Affect is flat.        Behavior: Behavior normal.        Thought Content: Thought content normal.    BP 132/84 (BP Location: Right Arm, Patient Position: Sitting, Cuff Size: Normal)  Pulse (!) 107    Resp 18    Ht 5' 9"  (1.753 m)    Wt 260 lb 0.6 oz (118 kg)    LMP  (LMP Unknown)    SpO2 96%    BMI 38.40 kg/m  Wt Readings from Last 3 Encounters:  01/14/22 260 lb 0.6 oz (118 kg)  12/27/21 258 lb (117 kg)  12/14/21 252 lb (114.3 kg)        Assessment & Plan:   Problem List Items Addressed This Visit       Other   Chest pain - Primary    Has some component of angina, has dyspnea upon exertion Has DM and HLD with FH of CAD EKG: Sinus rhythm. No signs of active ischemia. On statin and B-blocker currently Referred to  Cardiology for further eval - possible stress test.       Relevant Orders   Ambulatory referral to Cardiology   EKG 12-Lead (Completed)     No orders of the defined types were placed in this encounter.    Lindell Spar, MD

## 2022-01-14 NOTE — Patient Instructions (Signed)
Please continue taking medications as prescribed.  You are being referred to Cardiology.  Please go to ER if you have chest pain with shortness of breath or palpitations.

## 2022-01-14 NOTE — Assessment & Plan Note (Signed)
Has some component of angina, has dyspnea upon exertion Has DM and HLD with FH of CAD EKG: Sinus rhythm. No signs of active ischemia. On statin and B-blocker currently Referred to Cardiology for further eval - possible stress test.

## 2022-01-15 ENCOUNTER — Ambulatory Visit: Payer: Medicare Other | Admitting: Internal Medicine

## 2022-01-16 ENCOUNTER — Ambulatory Visit: Payer: Medicare Other | Admitting: Pulmonary Disease

## 2022-01-16 ENCOUNTER — Encounter: Payer: Self-pay | Admitting: Pulmonary Disease

## 2022-01-16 ENCOUNTER — Ambulatory Visit (INDEPENDENT_AMBULATORY_CARE_PROVIDER_SITE_OTHER): Payer: Medicare Other | Admitting: Pharmacist

## 2022-01-16 ENCOUNTER — Encounter: Payer: Self-pay | Admitting: Endocrinology

## 2022-01-16 ENCOUNTER — Other Ambulatory Visit: Payer: Self-pay

## 2022-01-16 DIAGNOSIS — G4733 Obstructive sleep apnea (adult) (pediatric): Secondary | ICD-10-CM

## 2022-01-16 DIAGNOSIS — E782 Mixed hyperlipidemia: Secondary | ICD-10-CM

## 2022-01-16 DIAGNOSIS — E1165 Type 2 diabetes mellitus with hyperglycemia: Secondary | ICD-10-CM

## 2022-01-16 NOTE — Progress Notes (Addendum)
Subjective:    Patient ID: Sydney Taylor, female    DOB: 1966/08/26, 56 y.o.   MRN: [id number]  HPI  Chief Complaint  Patient presents with   Follow-up    HST 6  years ago has had cpap machine.  Has seen pulmonary doctors In [place] /   55 year old woman from [place] presents to establish care for OSA.  PMH -diabetes type 2, hypertension, fibromyalgia, tachycardia  She reports chronic fatigue which she attributes to fibromyalgia and presented with loud snoring.  OSA was diagnosed 6 years ago and she was set up with CPAP with nasal pillows, DME was [facility].  She had good improvement, morning headaches resolved and she felt refreshed with her sleep.  Original sleep study was performed at [facility] in [place]. Epworth sleepiness score is 11 and she reports sleepiness while lying down to rest in the afternoons, watching TV, or as a passenger in a car. Bedtime is between 9 and 11 PM, sleep latency is about 30 minutes, she sleeps on her side with 1 pillow, reports 2-3 nocturnal awakenings including nocturia, wake up time varies and can be anytime between 6 and 9 AM she wakes up with dryness of mouth and feeling groggy and takes her a couple of hours to improve. She drinks 2 cups of coffee every morning There is no history suggestive of cataplexy, sleep paralysis or parasomnias  Significant tests/ events reviewed  10/2015 NPSG >> mod OSA, AHI 26/h, low spO2 77% 11/2015 CPAP >> 8 cm   Past Medical History:  Diagnosis Date   Anemia    Anxiety    Arrhythmia    Bipolar depression ([place]) 12/28/2019   Chronic fatigue    Chronic headaches    Depression    Diabetes mellitus without complication (HCC)    Fibromyalgia    GERD (gastroesophageal reflux disease)    High cholesterol    HLD (hyperlipidemia)    Hypertension    Insect bite    Migraine    Prediabetes    Sleep apnea    Urinary incontinence    Past Surgical History:  Procedure Laterality Date   ESOPHAGOGASTRODUODENOSCOPY N/A  05/12/2020   Procedure: ESOPHAGOGASTRODUODENOSCOPY (EGD);  Surgeon: Daneil Dolin, MD;  [facility];  Service: Endoscopy;  Laterality: N/A;  11:15am   Joint fusion on foot Right    MALONEY DILATION N/A 05/12/2020   Procedure: MALONEY DILATION;  Surgeon: Daneil Dolin, MD;  [facility];  Service: Endoscopy;  Laterality: N/A;   NASAL SEPTUM SURGERY     SINOSCOPY        Allergies  Allergen Reactions   Cephalexin Itching, Swelling and Other (See Comments)    Social History   Socioeconomic History   Marital status: Married    Spouse name: Not on file   Number of children: 0   Years of education: Not on file   Highest education level: Bachelor's degree (e.g., BA, AB, BS)  Occupational History   Occupation: research associate/drug development  Tobacco Use   Smoking status: Former    Types: Cigarettes    Quit date: 12/30/1982    Years since quitting: 39.0   Smokeless tobacco: Never  Vaping Use   Vaping Use: Never used  Substance and Sexual Activity   Alcohol use: Not Currently    Alcohol/week: 0.0 standard drinks   Drug use: Never   Sexual activity: Yes    Birth control/protection: Post-menopausal  Other Topics Concern   Not on file  Social  History Narrative   Married for 3 years.Lives with husband.Bachelors in Education officer, museum.   Right handed   Caffeine: 1-2 per day    Social Determinants of Health   Financial Resource Strain: Not on file  Food Insecurity: No Food Insecurity   Worried About Charity fundraiser in the Last Year: Never true   Ran Out of Food in the Last Year: Never true  Transportation Needs: No Transportation Needs   Lack of Transportation (Medical): No   Lack of Transportation (Non-Medical): No  Physical Activity: Not on file  Stress: Not on file  Social Connections: Not on file  Intimate Partner Violence: Not on file    Family History  Problem Relation Age of Onset   Hypertension Father    Hyperlipidemia Father    Diabetes  Father    Stroke Father    Depression Father    Anxiety disorder Father    Hypertension Mother    Hyperlipidemia Mother    Diabetes Mellitus II Mother    Congestive Heart Failure Mother    Heart attack Mother    Hypertension Sister    Diabetes Sister    Hypertension Brother    Autoimmune disease Brother    Diabetes Maternal Grandmother    Lupus Paternal Grandmother    Depression Paternal Grandmother    Anxiety disorder Paternal Grandmother    Depression Maternal Aunt    Anxiety disorder Maternal Aunt    Colon cancer Neg Hx    Esophageal cancer Neg Hx    Gastric cancer Neg Hx      Review of Systems  Constitutional: negative for anorexia, fevers and sweats  Eyes: negative for irritation, redness and visual disturbance  Ears, nose, mouth, throat, and face: negative for earaches, epistaxis, nasal congestion and sore throat  Respiratory: negative for cough, dyspnea on exertion, sputum and wheezing  Cardiovascular: negative for chest pain, dyspnea, lower extremity edema, orthopnea, palpitations and syncope  Gastrointestinal: negative for abdominal pain, constipation, diarrhea, melena, nausea and vomiting  Genitourinary:negative for dysuria, frequency and hematuria  Hematologic/lymphatic: negative for bleeding, easy bruising and lymphadenopathy  Musculoskeletal:negative for arthralgias, muscle weakness and stiff joints  Neurological: negative for coordination problems, gait problems, headaches and weakness  Endocrine: negative for diabetic symptoms including polydipsia, polyuria and weight loss     Objective:   Physical Exam  Gen. [place], obese, in no distress, normal affect ENT - no pallor,icterus, no post nasal drip, class 2-3 airway Neck: No JVD, no thyromegaly, no carotid bruits Lungs: no use of accessory muscles, no dullness to percussion, decreased without rales or rhonchi  Cardiovascular: Rhythm regular, heart sounds  normal, no murmurs or gallops, no peripheral  edema Abdomen: soft and non-tender, no hepatosplenomegaly, BS normal. Musculoskeletal: No deformities, no cyanosis or clubbing Neuro:  alert, non focal, no tremors       Assessment & Plan:

## 2022-01-16 NOTE — Patient Instructions (Addendum)
°  X cpap supplies will be renewed x 1 year  X We will track down your sleep study & CPAP report

## 2022-01-16 NOTE — Patient Instructions (Signed)
Sydney Taylor,  It was great to talk to you today!  Please call me with any questions or concerns.  Visit Information  Following are the goals we discussed today:   Goals Addressed             This Visit's Progress    Medication Management       Patient Goals/Self-Care Activities Patient will:  Take medications as prescribed Check blood sugar continuously with continuous glucose monitor, document, and provide at future appointments Engage in dietary modifications by less frequent dining out and fewer sweetened foods & beverages            Follow-up plan: Telephone follow up appointment with care management team member scheduled for:  02/27/22  Patient verbalizes understanding of instructions and care plan provided today and agrees to view in [place]. Active MyChart status confirmed with patient.    Please call the care guide team at [phone] if you need to cancel or reschedule your appointment.   Kennon Holter, PharmD, [place], CPP Clinical Pharmacist Practitioner [facility] [phone]

## 2022-01-16 NOTE — Assessment & Plan Note (Signed)
We will track down her sleep study either from DME or from her previous pulmonologist. CPAP supplies will be renewed. We will review CPAP report for compliance and control of events and tweak settings if needed She has settled down with nasal pillows We discussed alternative treatments of OSA to CPAP  Weight loss encouraged, compliance with goal of at least 4-6 hrs every night is the expectation. Advised against medications with sedative side effects Cautioned against driving when sleepy - understanding that sleepiness will vary on a day to day basis

## 2022-01-16 NOTE — Chronic Care Management (AMB) (Signed)
Chronic Care Management Pharmacy Note  01/16/2022 Name:  Sydney Taylor MRN:  [id number] DOB:  1966-08-03  Summary: Type 2 Diabetes (Followed by Dr. Loanne Drilling) Uncontrolled but improving; A1c 12.4>8.5 over last 3 months; Most recent A1c remains above goal of <7% per ADA guidelines. Suspect high levels of insulin resistance.  Current medications: Lantus 90 units subcutaneously once daily (recently increased by Dr. Loanne Drilling), empagliflozin (Jardiance) 10 mg by mouth once daily, metformin 1,000 mg by mouth daily with supper, and glipizide IR 10 mg by mouth twice daily with meals Current meal patterns:  patient is a vegetarian. Reports "compulsive eating" and struggles with eating foods high in carbohydrates (potato chips and pasta); she does not drink soda, juice, or milk. Does not add sugar to her coffee. Eats 3 meals a day. Eats 1-2 pieces of bread per day. Current glucose readings: See Dexcom report below for last 30 days. Continued improvement in blood glucose (especially over past few days with increase in basal insulin dose). Time in range 31%. Time above range 69%. No hypoglycemia.  Patient instructed to use Dexcom G6 to monitor blood glucose unless her sensor glucose reading does not match her symptoms. Discussed differences between blood glucose and interstitial glucose today.  Continue above medications per endocrinology. However, since patient is now requiring >60 units per day, may be reasonable to either switch to more concentrated insulin (200-300 units/mL) or split Lantus (100 units/mL) into 2 divided daily doses. Additionally, may be reasonable to increase metformin dose to 1,000 mg twice daily to increase insulin sensitivity and further improve blood glucose. Could also consider addition of pioglitazone (consider risk/benefit). Will send message to endocrinology to consider above recommendations.  May be reasonable to consider discontinuation of glipizide once A1c approaches  goal. Patient instructed to work on reducing carbohydrate intake. May consider referral to dietician.     Hyperlipidemia Triglycerides above goal of <150 per 2020 AACE/ACE guidelines. Triglycerides will likely improve as blood glucose continues to improve. May consider Vascepa for cardiovascular risk reduction although patient does not currently meet inclusion criteria per REDUCE-IT trial. She has diabetes but no additional major risk factors at this time.   Subjective: Sydney Taylor is an 55 y.o. year old female who is a primary patient of Lindell Spar, MD.  The CCM team was consulted for assistance with disease management and care coordination needs.    Engaged with patient face to face for follow up visit in response to provider referral for pharmacy case management and/or care coordination services.   Consent to Services:  The patient was given information about Chronic Care Management services, agreed to services, and gave verbal consent prior to initiation of services.  Please see initial visit note for detailed documentation.   Patient Care Team: Lindell Spar, MD as PCP - General (Internal Medicine) Fay Records, MD as PCP - Cardiology (Cardiology) Gala Romney Cristopher Estimable, MD as Consulting Physician (Gastroenterology) Beryle Lathe, [facility] (Pharmacist) Kassie Mends, RN as Case Manager  Objective:  Lab Results  Component Value Date   CREATININE 1.14 (H) 12/14/2021   CREATININE 0.85 03/19/2021   CREATININE 0.90 02/23/2021    Lab Results  Component Value Date   HGBA1C 8.5 (H) 12/14/2021   Last diabetic Eye exam:  Lab Results  Component Value Date/Time   HMDIABEYEEXA No Retinopathy 06/11/2021 02:22 PM    Last diabetic Foot exam: No results found for: HMDIABFOOTEX      Component Value Date/Time   CHOL 136 12/14/2021  1012   TRIG 189 (H) 12/14/2021 1012   HDL 36 (L) 12/14/2021 1012   CHOLHDL 3.8 12/14/2021 1012   CHOLHDL 5.2 (H) 01/21/2020 1009   LDLCALC  68 12/14/2021 1012   LDLCALC 133 (H) 01/21/2020 1009    Hepatic Function Latest Ref Rng & Units 12/14/2021 03/19/2021 07/31/2020  Total Protein 6.0 - 8.5 g/dL 6.2 6.4 6.3  Albumin 3.8 - 4.9 g/dL 4.2 - -  AST 0 - 40 IU/L _0 ALT 0 - 32 IU/L _1 Alk Phosphatase 44 - 121 IU/L 83 - -  Total Bilirubin 0.0 - 1.2 mg/dL 0.4 0.4 0.6    Lab Results  Component Value Date/Time   TSH 2.220 12/14/2021 10:12 AM   TSH 1.67 10/11/2021 02:28 PM   FREET4 0.95 12/14/2021 10:12 AM   FREET4 0.82 10/11/2021 02:28 PM    CBC Latest Ref Rng & Units 12/14/2021 09/01/2020 07/31/2020  WBC 3.4 - 10.8 x10E3/uL 9.9 9.7 8.6  Hemoglobin 11.1 - 15.9 g/dL 14.9 14.8 14.9  Hematocrit 34.0 - 46.6 % 45.1 44.2 45.1(H)  Platelets 150 - 450 x10E3/uL 357 279 323    Lab Results  Component Value Date/Time   VD25OH 32 01/21/2020 10:09 AM    Clinical ASCVD: No  The 10-year ASCVD risk score (Arnett DK, et al., 2019) is: 6.5%   Values used to calculate the score:     Age: 60 years     Sex: Female     Is Non-Hispanic African American: No     Diabetic: Yes     Tobacco smoker: No     Systolic Blood Pressure: 993 mmHg     Is BP treated: Yes     HDL Cholesterol: 36 mg/dL     Total Cholesterol: 136 mg/dL    Social History   Tobacco Use  Smoking Status Former   Types: Cigarettes   Quit date: 12/30/1982   Years since quitting: 39.0  Smokeless Tobacco Never   BP Readings from Last 3 Encounters:  01/16/22 138/74  01/14/22 132/84  01/07/22 127/81   Pulse Readings from Last 3 Encounters:  01/16/22 (!) 102  01/14/22 (!) 107  01/07/22 95   Wt Readings from Last 3 Encounters:  01/16/22 262 lb (118.8 kg)  01/14/22 260 lb 0.6 oz (118 kg)  12/27/21 258 lb (117 kg)    Assessment: Review of patient past medical history, allergies, medications, health status, including review of consultants reports, laboratory and other test data, was performed as part of comprehensive evaluation and provision of chronic care  management services.   SDOH:  (Social Determinants of Health) assessments and interventions performed:    CCM Care Plan  Allergies  Allergen Reactions   Cephalexin Itching, Swelling and Other (See Comments)    Medications Reviewed Today     Reviewed by Rigoberto Noel, MD (Physician) on 01/16/22 at 1134  Med List Status: <None>   Medication Order Taking? Sig Documenting Provider Last Dose Status Informant  ALPRAZolam (XANAX) 0.5 MG tablet [id number] Yes Take 0.5 mg by mouth daily as needed. [provider] Taking Active   blood glucose meter kit and supplies KIT [id number] Yes Inject 1 each into the skin daily. Check blood glucose once a day. Diagnosis code: E 11.9. Doree Albee, MD Taking Active   buPROPion [facility] SR) 150 MG 12 hr tablet [id number] Yes Take 150 mg by mouth daily. [provider] Taking Active   cetirizine (ZYRTEC ALLERGY) 10 MG tablet [id number]  Yes Take 1 tablet (10 mg total) by mouth daily. Volney American, [place] Taking Active   chlorthalidone (HYGROTON) 25 MG tablet [id number] Yes Take 0.5 tablets (12.5 mg total) by mouth daily. Lindell Spar, MD Taking Active   Cholecalciferol (VITAMIN D3) 125 MCG (5000 UT) TABS [id number] Yes Take 2 tablets (10,000 Units total) by mouth daily. Doree Albee, MD Taking Active   Continuous Blood Gluc Sensor (Mono) MISC [id number] Yes by Does not apply route. [provider] Taking Active   Continuous Blood Gluc Transmit ([place]) MISC [id number] Yes by Does not apply route. [provider] Taking Active   Cyanocobalamin (VITAMIN B12) 1000 MCG TBCR [id number] Yes Take 1,000 mcg by mouth daily. [provider] Taking Active   DULoxetine (CYMBALTA) 60 MG capsule [id number] Yes Take 60 mg by mouth daily. [provider] Taking Active   glipiZIDE (GLUCOTROL) 10 MG tablet [id number] Yes TAKE 1 TABLET BY MOUTH TWICE DAILY BEFORE A MEAL Renato Shin, MD  Taking Active   glucose blood (ACCU-CHEK GUIDE) test strip [id number] Yes 1 each by Other route in the morning, at noon, and at bedtime. Use as instructed Ailene Ards, NP Taking Active   insulin glargine (LANTUS SOLOSTAR) 100 UNIT/ML Solostar Pen [id number] Yes Inject 65 Units into the skin every morning. And pen needles 1/day  Patient taking differently: Inject 90 Units into the skin every morning. And pen needles 1/day   Renato Shin, MD Taking Active   Insulin Pen Needle (PEN NEEDLES) 32G X 4 MM MISC [id number] Yes Check BS 2 x a day Renato Shin, MD Taking Active   JARDIANCE 10 MG TABS tablet [id number] Yes TAKE 1 TABLET(10 MG) BY MOUTH DAILY BEFORE BREAKFAST Lindell Spar, MD Taking Active   KLOR-CON M20 20 MEQ tablet [id number] Yes TAKE 2 TABLETS BY MOUTH TWICE A DAY Lindell Spar, MD Taking Active   L-Methylfolate 15 MG TABS [id number] Yes Take 15 mg by mouth daily at 6 (six) AM. [provider] Taking Active   losartan (COZAAR) 25 MG tablet [id number] Yes TAKE 1 TABLET(25 MG) BY MOUTH DAILY Lindell Spar, MD Taking Active   metFORMIN (GLUCOPHAGE-XR) 500 MG 24 hr tablet [id number] Yes Take 2 tablets (1,000 mg total) by mouth daily with breakfast.  Patient taking differently: Take 1,000 mg by mouth daily with supper.   Renato Shin, MD Taking Active   metoCLOPramide (REGLAN) 5 MG tablet [id number] Yes Take 1 tablet (5 mg total) by mouth every 8 (eight) hours as needed for nausea. Lindell Spar, MD Taking Active   metoprolol tartrate (LOPRESSOR) 50 MG tablet [id number] Yes Take 1 tablet (50 mg total) by mouth 2 (two) times daily. No more refills will be approved by this provider as office is now permanently closed, patient will need to find another primary care provider. Lindell Spar, MD Taking Active   Multiple Vitamins-Minerals (MULTIVITAMIN WOMEN 50+) TABS [id number] Yes Take 1 tablet by mouth daily. [provider] Taking Active Self  Pantoprazole Sodium (PROTONIX PO)  [id number] Yes Take 40 mg by mouth daily. [provider] Taking Active   pregabalin (LYRICA) 75 MG capsule [id number] Yes Take 75 mg by mouth at bedtime. [provider] Taking Active   rosuvastatin (CRESTOR) 10 MG tablet [id number] Yes Take 1 tablet (10 mg total) by mouth daily. Lindell Spar, MD Taking Active   Saccharomyces boulardii (PROBIOTIC) 250 MG CAPS [id number] Yes Take 1 capsule by mouth daily. [provider] Taking Active   SUMAtriptan (IMITREX) 50 MG tablet [id number] Yes MAY REPEAT IN 2 HOURS IF HEADACHE PERSISTS OR RECURS. MAXIMUM 2 PER DAY. Lindell Spar, MD Taking Active   traZODone (DESYREL) 50 MG tablet [id number] Yes Take 50 mg by mouth at bedtime as needed. [provider] Taking Active             Patient Active Problem List   Diagnosis Date Noted   Chest pain 01/14/2022   Chronic migraine without aura without status migrainosus, not intractable 10/29/2021   Diabetic polyneuropathy associated with type 2 diabetes mellitus ([place]) 09/19/2021   History of hormone therapy 09/19/2021   Bilateral primary osteoarthritis of knee 04/10/2021   Perennial allergic rhinitis 04/06/2021   Allergic contact dermatitis 04/06/2021   Cholinergic urticaria 04/06/2021   Fibromyalgia syndrome 01/12/2021   Elevated C-reactive protein (CRP) 01/12/2021   Diverticulosis of colon without hemorrhage 01/02/2021   Major depressive disorder, single episode, severe ([place]) 07/14/2020   GERD (gastroesophageal reflux disease) 05/11/2020   Type 2 diabetes mellitus ([place]) 03/27/2020   Bipolar depression ([place]) 12/28/2019   Sleep apnea 12/28/2019   Tachycardia 10/31/2019   Hyperlipidemia 10/30/2015   Morbid obesity ([place]) 10/30/2015    Immunization History  Administered Date(s) Administered   Influenza Whole 10/30/2021   Influenza,inj,Quad PF,6+ Mos 11/01/2019   Influenza-Unspecified 10/30/2020   Moderna Sars-Covid-2 Vaccination 05/13/2020, 06/23/2020,  07/12/2021   Pneumococcal Polysaccharide-23 12/18/2020   Td 12/30/2020   Zoster Recombinat (Shingrix) 12/30/2020, 05/29/2021    Conditions to be addressed/monitored: HLD and DMII  Care Plan : Medication Management  Updates made by Beryle Lathe, [place]erma since 01/16/2022 12:00 AM     Problem: Diabetes and Hyperlipidemia   Priority: High  Onset Date: 10/10/2021     Long-Range Goal: Disease Progression Prevention   Start Date: 10/10/2021  Expected End Date: 01/08/2022  Recent Progress: On track  Priority: High  Note:   Current Barriers:  Unable to achieve control of diabetes and hyperlipidemia  Pharmacist Clinical Goal(s):  Patient will Achieve control of diabetes and hyperlipidemia as evidenced by improved fasting blood sugar, improved A1c, and improved triglycerides through collaboration with PharmD and provider.   Interventions: 1:1 collaboration with Lindell Spar, MD regarding development and update of comprehensive plan of care as evidenced by provider attestation and co-signature Inter-disciplinary care team collaboration (see longitudinal plan of care) Comprehensive medication review performed; medication list updated in electronic medical record  Type 2 Diabetes - Goal on Track (progressing): YES. (Followed by Dr. Loanne Drilling) Uncontrolled but improving; A1c 12.4>8.5 over last 3 months; Most recent A1c remains above goal of <7% per ADA guidelines. Suspect high levels of insulin resistance.  Current medications: Lantus 90 units subcutaneously once daily (recently increased by Dr. Loanne Drilling), empagliflozin (Jardiance) 10 mg by mouth once daily, metformin 1,000 mg by mouth daily with supper, and glipizide IR 10 mg by mouth twice daily with meals Intolerances:  GI upset/drowsiness with metformin more than 1,000 mg/day; nausea and vomiting with Trulicity 3 mg weekly Taking medications as directed: yes Side effects thought to be attributed to current medication regimen:  no Denies hypoglycemic symptoms (sweaty and shaky). Hypoglycemia prevention: not indicated at this time Current meal patterns:  patient is a vegetarian. Reports "compulsive eating" and struggles with eating foods high in carbohydrates (potato chips and pasta); she does not drink soda, juice, or milk. Does not add sugar to her coffee. Eats 3 meals a day. Eats 1-2 pieces of bread per day. Current  exercise:  walks some but is generally not very active due to fatigue On a statin: yes On aspirin 81 mg daily: no Last microalbumin/creatinine ratio: 5 (12/18/20); on an ACEi/ARB: yes Last eye exam: completed within last year Last foot exam: completed within last year Pneumonia vaccine: up to date Influenza vaccine:  overdue Shingles: series complete Current glucose readings: See Dexcom report below for last 30 days. Continued improvement in blood glucose (especially over past few days with increase in basal insulin dose). Time in range 31%. Time above range 69%. No hypoglycemia.  Patient instructed to use Dexcom G6 to monitor blood glucose unless her sensor glucose reading does not match her symptoms. Discussed differences between blood glucose and interstitial glucose today.  Continue above medications per endocrinology. However, since patient is now requiring >60 units per day, may be reasonable to either switch to more concentrated insulin (200-300 units/mL) or split Lantus (100 units/mL) into 2 divided daily doses. Additionally, may be reasonable to increase metformin dose to 1,000 mg twice daily to increase insulin sensitivity and further improve blood glucose. Could also consider addition of pioglitazone (consider risk/benefit). Will send message to endocrinology to consider above recommendations.  May be reasonable to consider discontinuation of glipizide once A1c approaches goal. May be reasonable to consider restarting Trulicity at a lower dose. Patient reports tolerating lower doses (0.75 mg and  1.5 mg) with some mild nausea but did not start having vomiting until she increased to 3 mg dose. Patient could benefit from the weight loss of GLP-1 agonist and is approved through [employer]. Patient instructed to work on reducing carbohydrate intake. May consider referral to dietician.  Patient has shared Dexcom G6 data with our clinic. Will continue to review with patient at each visit.  Patient given number for [facility] to contact for refills of Dexcom G6 [phone]) The following education materials were previously given to the patient: carbohydrate guide, dining out with Diabetes, and Planning healthy meals     Hyperlipidemia - Goal on Track (progressing): YES.: Uncontrolled but improving; LDL at goal of <70 due to very high risk given diabetes + at least 1 additional major risk factor (hypertension) per 2020 AACE/ACE guidelines. Triglycerides above goal of <150 per 2020 AACE/ACE guidelines. Current medications: rosuvastatin 10 mg by mouth once daily Intolerances: none Taking medications as directed: yes Side effects thought to be attributed to current medication regimen: no Continue rosuvastatin 10 mg by mouth once daily Encourage dietary reduction of high fat containing foods such as butter, nuts, bacon, egg yolks, etc. Discussed need for and importance of continued work on weight loss Reviewed risks of hyperlipidemia, principles of treatment and consequences of untreated hyperlipidemia Discussed need for medication compliance Triglycerides will likely improve as blood glucose continues to improve. May consider Vascepa for cardiovascular risk reduction although patient does not currently meet inclusion criteria per REDUCE-IT trial. She has diabetes but no additional major risk factors at this time.   Patient Goals/Self-Care Activities Patient will:  Take medications as prescribed Check blood sugar continuously with continuous glucose monitor, document, and  provide at future appointments Engage in dietary modifications by less frequent dining out and fewer sweetened foods & beverages  Follow Up Plan: Telephone follow up appointment with care management team member scheduled for: 02/27/22      Medication Assistance:  [employer] for Trulicity although not currently taking  Patient's preferred pharmacy is:  [facility], [address] [place]  [place] [address] Phone: [phone] Fax: [phone]  [place], [address] [place] [address] Phone: [phone] Fax: [phone]  [facility] [phone] - [place], [place] [address] [place] [address] Phone: [phone] Fax: [phone]  [id number]: [phone]: [phone] Follow Up:  Patient agrees to Care Plan and Follow-up.  Plan: Telephone follow up appointment with care management team member scheduled for:  02/27/22  CKennon Holter PharmD, BReasnor CPP Clinical Pharmacist Practitioner [facility] [phone]

## 2022-01-17 ENCOUNTER — Other Ambulatory Visit: Payer: Self-pay | Admitting: Endocrinology

## 2022-01-17 MED ORDER — TOUJEO MAX SOLOSTAR 300 UNIT/ML ~~LOC~~ SOPN: 95.0000 [IU] | PEN_INJECTOR | SUBCUTANEOUS | 3 refills | Status: DC

## 2022-01-18 ENCOUNTER — Other Ambulatory Visit: Payer: Self-pay | Admitting: Internal Medicine

## 2022-01-22 ENCOUNTER — Other Ambulatory Visit: Payer: Self-pay

## 2022-01-22 ENCOUNTER — Ambulatory Visit (HOSPITAL_COMMUNITY)
Admission: RE | Admit: 2022-01-22 | Discharge: 2022-01-22 | Disposition: A | Discharge status: Home / Self Care | Payer: Medicare Other | Source: Ambulatory Visit

## 2022-01-22 DIAGNOSIS — N6310 Unspecified lump in the right breast, unspecified quadrant: Secondary | ICD-10-CM | POA: Diagnosis present

## 2022-01-22 DIAGNOSIS — N6321 Unspecified lump in the left breast, upper outer quadrant: Secondary | ICD-10-CM | POA: Insufficient documentation

## 2022-01-23 ENCOUNTER — Other Ambulatory Visit (HOSPITAL_COMMUNITY): Payer: Self-pay

## 2022-01-23 ENCOUNTER — Other Ambulatory Visit: Payer: Self-pay

## 2022-01-23 DIAGNOSIS — R928 Other abnormal and inconclusive findings on diagnostic imaging of breast: Secondary | ICD-10-CM

## 2022-01-25 ENCOUNTER — Encounter: Payer: Self-pay | Admitting: Endocrinology

## 2022-01-29 DIAGNOSIS — E782 Mixed hyperlipidemia: Secondary | ICD-10-CM

## 2022-01-29 DIAGNOSIS — E1165 Type 2 diabetes mellitus with hyperglycemia: Secondary | ICD-10-CM | POA: Diagnosis not present

## 2022-02-01 ENCOUNTER — Encounter: Payer: Self-pay | Admitting: Internal Medicine

## 2022-02-01 ENCOUNTER — Other Ambulatory Visit: Payer: Self-pay

## 2022-02-01 ENCOUNTER — Ambulatory Visit (INDEPENDENT_AMBULATORY_CARE_PROVIDER_SITE_OTHER): Payer: Medicare Other | Admitting: Internal Medicine

## 2022-02-01 ENCOUNTER — Encounter (INDEPENDENT_AMBULATORY_CARE_PROVIDER_SITE_OTHER): Payer: Self-pay

## 2022-02-01 VITALS — BP 122/88 | HR 94 | Resp 16 | Ht 69.0 in | Wt 259.0 lb

## 2022-02-01 DIAGNOSIS — M722 Plantar fascial fibromatosis: Secondary | ICD-10-CM | POA: Insufficient documentation

## 2022-02-01 DIAGNOSIS — E1142 Type 2 diabetes mellitus with diabetic polyneuropathy: Secondary | ICD-10-CM | POA: Diagnosis not present

## 2022-02-01 DIAGNOSIS — Z0001 Encounter for general adult medical examination with abnormal findings: Secondary | ICD-10-CM

## 2022-02-01 DIAGNOSIS — F322 Major depressive disorder, single episode, severe without psychotic features: Secondary | ICD-10-CM

## 2022-02-01 DIAGNOSIS — G43109 Migraine with aura, not intractable, without status migrainosus: Secondary | ICD-10-CM | POA: Insufficient documentation

## 2022-02-01 NOTE — Patient Instructions (Addendum)
Please use Ankle brace for plantar fasciitis.  Please continue to take other medications as prescribed.  Please continue to follow low carb diet and ambulate as tolerated.

## 2022-02-01 NOTE — Progress Notes (Signed)
Established Patient Office Visit  Subjective:  Patient ID: Sydney Taylor, female    DOB: 11-02-1966  Age: 56 y.o. MRN: [id number]  CC:  Chief Complaint  Patient presents with   Annual Exam    Annual exam pt is having right foot middle toe pain for a few months she hit it on table and it is still hurting     HPI Sydney Taylor is a 56 y.o. female with past medical history of OSA, GERD, type II DM with diabetic neuropathy, HLD, tachycardia, depression with anxiety, fibromyalgia and obesity who presents for annual physical.  She complains of bilateral heel pain for last 1 month, which is worse upon prolonged standing and walking.  She also reports pain over left third toe of her right foot, which is started after she hit it at home with some furniture possibility, does not recall a particular object.  Her pain is better now.  Her blood glucose has been better controlled compared to prior now, but still sees some readings above 200.  She has been taking Lantus 96 U, metformin, glipizide and Jardiance currently.  She reports heat insensitivity episodes followed by cold, clammy extremities at times.  Of note, she is on Lyrica for DM neuropathy currently.  Past Medical History:  Diagnosis Date   Anemia    Anxiety    Arrhythmia    Bipolar depression ([place]) 12/28/2019   Chronic fatigue    Chronic headaches    Depression    Diabetes mellitus without complication (HCC)    Fibromyalgia    GERD (gastroesophageal reflux disease)    High cholesterol    HLD (hyperlipidemia)    Hypertension    Insect bite    Migraine    Prediabetes    Sleep apnea    Urinary incontinence     Past Surgical History:  Procedure Laterality Date   ESOPHAGOGASTRODUODENOSCOPY N/A 05/12/2020   Procedure: ESOPHAGOGASTRODUODENOSCOPY (EGD);  Surgeon: Daneil Dolin, MD;  Location: [facility];  Service: Endoscopy;  Laterality: N/A;  11:15am   Joint fusion on foot Right    MALONEY DILATION N/A 05/12/2020    Procedure: MALONEY DILATION;  Surgeon: Daneil Dolin, MD;  Location: [facility];  Service: Endoscopy;  Laterality: N/A;   NASAL SEPTUM SURGERY     SINOSCOPY      Family History  Problem Relation Age of Onset   Hypertension Father    Hyperlipidemia Father    Diabetes Father    Stroke Father    Depression Father    Anxiety disorder Father    Hypertension Mother    Hyperlipidemia Mother    Diabetes Mellitus II Mother    Congestive Heart Failure Mother    Heart attack Mother    Hypertension Sister    Diabetes Sister    Hypertension Brother    Autoimmune disease Brother    Diabetes Maternal Grandmother    Lupus Paternal Grandmother    Depression Paternal Grandmother    Anxiety disorder Paternal Grandmother    Depression Maternal Aunt    Anxiety disorder Maternal Aunt    Colon cancer Neg Hx    Esophageal cancer Neg Hx    Gastric cancer Neg Hx     Social History   Socioeconomic History   Marital status: Married    Spouse name: Not on file   Number of children: 0   Years of education: Not on file   Highest education level: Bachelor's degree (e.g., BA, AB, BS)  Occupational History  Occupation: Airline pilot  Tobacco Use   Smoking status: Former    Types: Cigarettes    Quit date: 12/30/1982    Years since quitting: 39.1   Smokeless tobacco: Never  Vaping Use   Vaping Use: Never used  Substance and Sexual Activity   Alcohol use: Not Currently    Alcohol/week: 0.0 standard drinks   Drug use: Never   Sexual activity: Yes    Birth control/protection: Post-menopausal  Other Topics Concern   Not on file  Social History Narrative   Married for 3 years.Lives with husband.Bachelors in Education officer, museum.   Right handed   Caffeine: 1-2 per day    Social Determinants of Health   Financial Resource Strain: Not on file  Food Insecurity: No Food Insecurity   Worried About Charity fundraiser in the Last Year: Never true   Ran Out of Food in the Last Year:  Never true  Transportation Needs: No Transportation Needs   Lack of Transportation (Medical): No   Lack of Transportation (Non-Medical): No  Physical Activity: Not on file  Stress: Not on file  Social Connections: Not on file  Intimate Partner Violence: Not on file    Outpatient Medications Prior to Visit  Medication Sig Dispense Refill   ALPRAZolam (XANAX) 0.5 MG tablet Take 0.5 mg by mouth daily as needed.     blood glucose meter kit and supplies KIT Inject 1 each into the skin daily. Check blood glucose once a day. Diagnosis code: E 11.9. 1 each 0   buPROPion (WELLBUTRIN SR) 150 MG 12 hr tablet Take 150 mg by mouth daily.     cetirizine (ZYRTEC ALLERGY) 10 MG tablet Take 1 tablet (10 mg total) by mouth daily. 30 tablet 1   chlorthalidone (HYGROTON) 25 MG tablet Take 0.5 tablets (12.5 mg total) by mouth daily. 45 tablet 1   Cholecalciferol (VITAMIN D3) 125 MCG (5000 UT) TABS Take 2 tablets (10,000 Units total) by mouth daily. 180 tablet 1   Continuous Blood Gluc Sensor (DEXCOM G6 SENSOR) MISC by Does not apply route.     Continuous Blood Gluc Transmit (DEXCOM G6 TRANSMITTER) MISC by Does not apply route.     Cyanocobalamin (VITAMIN B12) 1000 MCG TBCR Take 1,000 mcg by mouth daily.     DULoxetine (CYMBALTA) 60 MG capsule Take 60 mg by mouth daily.     glipiZIDE (GLUCOTROL) 10 MG tablet TAKE 1 TABLET BY MOUTH TWICE DAILY BEFORE A MEAL 180 tablet 0   glucose blood (ACCU-CHEK GUIDE) test strip 1 each by Other route in the morning, at noon, and at bedtime. Use as instructed 300 each 3   insulin glargine, 2 Unit Dial, (TOUJEO MAX SOLOSTAR) 300 UNIT/ML Solostar Pen Inject 95 Units into the skin every morning. And pen needles 1/day 36 mL 3   Insulin Pen Needle (PEN NEEDLES) 32G X 4 MM MISC Check BS 2 x a day 100 each 5   JARDIANCE 10 MG TABS tablet TAKE 1 TABLET(10 MG) BY MOUTH DAILY BEFORE BREAKFAST 30 tablet 2   KLOR-CON M20 20 MEQ tablet TAKE 2 TABLETS BY MOUTH TWICE A DAY 360 tablet 1    L-Methylfolate 15 MG TABS Take 15 mg by mouth daily at 6 (six) AM.     losartan (COZAAR) 25 MG tablet TAKE 1 TABLET(25 MG) BY MOUTH DAILY 90 tablet 0   metFORMIN (GLUCOPHAGE-XR) 500 MG 24 hr tablet Take 2 tablets (1,000 mg total) by mouth daily with breakfast. (Patient taking differently: Take  1,000 mg by mouth daily with supper.) 180 tablet 3   metoCLOPramide (REGLAN) 5 MG tablet Take 1 tablet (5 mg total) by mouth every 8 (eight) hours as needed for nausea. 30 tablet 0   metoprolol tartrate (LOPRESSOR) 50 MG tablet Take 1 tablet (50 mg total) by mouth 2 (two) times daily. No more refills will be approved by this provider as office is now permanently closed, patient will need to find another primary care provider. 180 tablet 0   Multiple Vitamins-Minerals (MULTIVITAMIN WOMEN 50+) TABS Take 1 tablet by mouth daily.     Pantoprazole Sodium (PROTONIX PO) Take 40 mg by mouth daily.     pregabalin (LYRICA) 75 MG capsule Take 75 mg by mouth at bedtime.     rosuvastatin (CRESTOR) 10 MG tablet Take 1 tablet (10 mg total) by mouth daily. 30 tablet 11   Saccharomyces boulardii (PROBIOTIC) 250 MG CAPS Take 1 capsule by mouth daily.     SUMAtriptan (IMITREX) 50 MG tablet MAY REPEAT IN 2 HOURS IF HEADACHE PERSISTS OR RECURS. MAXIMUM 2 PER DAY. 10 tablet 0   traZODone (DESYREL) 50 MG tablet Take 50 mg by mouth at bedtime as needed.     No facility-administered medications prior to visit.    Allergies  Allergen Reactions   Cephalexin Itching, Swelling and Other (See Comments)    ROS Review of Systems  Constitutional:  Negative for chills and fever.  HENT:  Positive for postnasal drip, sinus pressure and sinus pain. Negative for ear discharge.   Respiratory:  Positive for shortness of breath. Negative for wheezing.   Cardiovascular:  Negative for chest pain and palpitations.  Gastrointestinal:  Negative for diarrhea and vomiting.  Genitourinary:  Negative for dysuria and hematuria.  Musculoskeletal:   Negative for neck pain and neck stiffness.       B/l heel pain  Skin:  Negative for rash.  Neurological:  Negative for dizziness and weakness.  Psychiatric/Behavioral:  Negative for agitation and behavioral problems.      Objective:    Physical Exam Constitutional:      General: She is not in acute distress.    Appearance: She is obese. She is not diaphoretic.  HENT:     Head: Normocephalic and atraumatic.     Nose: Congestion present.     Mouth/Throat:     Mouth: Mucous membranes are dry.     Pharynx: No posterior oropharyngeal erythema.  Eyes:     General: No scleral icterus.    Extraocular Movements: Extraocular movements intact.  Cardiovascular:     Rate and Rhythm: Normal rate and regular rhythm.     Pulses: Normal pulses.     Heart sounds: Normal heart sounds. No murmur heard. Pulmonary:     Breath sounds: Normal breath sounds. No wheezing or rales.  Abdominal:     Palpations: Abdomen is soft.     Tenderness: There is no abdominal tenderness.  Musculoskeletal:     Cervical back: Neck supple. No tenderness.     Right lower leg: Edema (Trace) present.     Left lower leg: Edema (Trace) present.  Skin:    General: Skin is dry.     Coloration: Skin is not pale.     Findings: No rash.  Neurological:     General: No focal deficit present.     Mental Status: She is alert and oriented to person, place, and time.     Cranial Nerves: No cranial nerve deficit.  Sensory: No sensory deficit.     Motor: No weakness.  Psychiatric:        Mood and Affect: Mood is depressed. Affect is flat.        Behavior: Behavior normal.        Thought Content: Thought content normal.    BP 122/88 (BP Location: Right Arm, Patient Position: Sitting, Cuff Size: Normal)    Pulse 94    Resp 16    Ht 5' 9"  (1.753 m)    Wt 259 lb 0.6 oz (117.5 kg)    LMP  (LMP Unknown)    SpO2 95%    BMI 38.25 kg/m  Wt Readings from Last 3 Encounters:  02/01/22 259 lb 0.6 oz (117.5 kg)  01/16/22 262 lb  (118.8 kg)  01/14/22 260 lb 0.6 oz (118 kg)    Lab Results  Component Value Date   TSH 2.220 12/14/2021   Lab Results  Component Value Date   WBC 9.9 12/14/2021   HGB 14.9 12/14/2021   HCT 45.1 12/14/2021   MCV 95 12/14/2021   PLT 357 12/14/2021   Lab Results  Component Value Date   NA 142 12/14/2021   K 4.3 12/14/2021   CO2 24 12/14/2021   GLUCOSE 170 (H) 12/14/2021   BUN 11 12/14/2021   CREATININE 1.14 (H) 12/14/2021   BILITOT 0.4 12/14/2021   ALKPHOS 83 12/14/2021   AST 18 12/14/2021   ALT 19 12/14/2021   PROT 6.2 12/14/2021   ALBUMIN 4.2 12/14/2021   CALCIUM 9.5 12/14/2021   ANIONGAP 9 07/12/2020   EGFR 56 (L) 12/14/2021   Lab Results  Component Value Date   CHOL 136 12/14/2021   Lab Results  Component Value Date   HDL 36 (L) 12/14/2021   Lab Results  Component Value Date   LDLCALC 68 12/14/2021   Lab Results  Component Value Date   TRIG 189 (H) 12/14/2021   Lab Results  Component Value Date   CHOLHDL 3.8 12/14/2021   Lab Results  Component Value Date   HGBA1C 8.5 (H) 12/14/2021      Assessment & Plan:   Encounter for general adult medical examination with abnormal findings Physical exam as documented. Counseling done  re healthy lifestyle involving commitment to 150 minutes exercise per week, heart healthy diet, and attaining healthy weight.The importance of adequate sleep also discussed. Changes in health habits are decided on by the patient with goals and time frames  set for achieving them. Immunization and cancer screening needs are specifically addressed at this visit.  Diabetic polyneuropathy associated with type 2 diabetes mellitus (HCC) On Lyrica Her heat sensitivity and cold, clammy extremities could be due to neuropathy and/or anxiety  Morbid obesity ([place]) Advised to follow low-carb diet and moderate exercise/walking as tolerated On Trulicity  Major depressive disorder, single episode, severe ([place]) On Wellbutrin and  Cymbalta Followed by psychiatry - Ivin Booty Register  Plantar fasciitis Heel pain likely due to plantar fasciitis Advised to apply ice Rest and leg elevation Advised to wear ankle brace for plantar fascitis at nighttime    No orders of the defined types were placed in this encounter.   Follow-up: Return in about 4 months (around 06/01/2022) for DM.    Lindell Spar, MD

## 2022-02-01 NOTE — Assessment & Plan Note (Signed)

## 2022-02-01 NOTE — Assessment & Plan Note (Signed)
Heel pain likely due to plantar fasciitis Advised to apply ice Rest and leg elevation Advised to wear ankle brace for plantar fascitis at nighttime

## 2022-02-01 NOTE — Assessment & Plan Note (Signed)
On Wellbutrin and Cymbalta Followed by psychiatry - Sharon Register 

## 2022-02-01 NOTE — Assessment & Plan Note (Signed)
On Lyrica Her heat sensitivity and cold, clammy extremities could be due to neuropathy and/or anxiety

## 2022-02-01 NOTE — Assessment & Plan Note (Signed)
Advised to follow low-carb diet and moderate exercise/walking as tolerated On Trulicity

## 2022-02-03 HISTORY — PX: CHOLECYSTECTOMY: SHX55

## 2022-02-05 ENCOUNTER — Ambulatory Visit: Payer: Medicare Other | Admitting: *Deleted

## 2022-02-05 DIAGNOSIS — E1165 Type 2 diabetes mellitus with hyperglycemia: Secondary | ICD-10-CM

## 2022-02-05 DIAGNOSIS — E782 Mixed hyperlipidemia: Secondary | ICD-10-CM

## 2022-02-05 NOTE — Chronic Care Management (AMB) (Signed)
Chronic Care Management   CCM RN Visit Note  02/05/2022 Name: Sydney Taylor MRN: [id number] DOB: March 15, 1966  Subjective: Sydney Taylor is a 56 y.o. year old female who is a primary care patient of Lindell Spar, MD. The care management team was consulted for assistance with disease management and care coordination needs.    Engaged with patient by telephone for follow up visit in response to provider referral for case management and/or care coordination services.   Consent to Services:  The patient was given information about Chronic Care Management services, agreed to services, and gave verbal consent prior to initiation of services.  Please see initial visit note for detailed documentation.   Patient agreed to services and verbal consent obtained.   Assessment: Review of patient past medical history, allergies, medications, health status, including review of consultants reports, laboratory and other test data, was performed as part of comprehensive evaluation and provision of chronic care management services.   SDOH (Social Determinants of Health) assessments and interventions performed:    CCM Care Plan  Allergies  Allergen Reactions   Cephalexin Itching, Swelling and Other (See Comments)    Outpatient Encounter Medications as of 02/05/2022  Medication Sig   ALPRAZolam (XANAX) 0.5 MG tablet Take 0.5 mg by mouth daily as needed.   blood glucose meter kit and supplies KIT Inject 1 each into the skin daily. Check blood glucose once a day. Diagnosis code: E 11.9.   buPROPion (WELLBUTRIN SR) 150 MG 12 hr tablet Take 150 mg by mouth daily.   cetirizine (ZYRTEC ALLERGY) 10 MG tablet Take 1 tablet (10 mg total) by mouth daily.   chlorthalidone (HYGROTON) 25 MG tablet Take 0.5 tablets (12.5 mg total) by mouth daily.   Cholecalciferol (VITAMIN D3) 125 MCG (5000 UT) TABS Take 2 tablets (10,000 Units total) by mouth daily.   Continuous Blood Gluc Sensor (DEXCOM G6 SENSOR) MISC by Does  not apply route.   Continuous Blood Gluc Transmit (DEXCOM G6 TRANSMITTER) MISC by Does not apply route.   Cyanocobalamin (VITAMIN B12) 1000 MCG TBCR Take 1,000 mcg by mouth daily.   DULoxetine (CYMBALTA) 60 MG capsule Take 60 mg by mouth daily.   glipiZIDE (GLUCOTROL) 10 MG tablet TAKE 1 TABLET BY MOUTH TWICE DAILY BEFORE A MEAL   glucose blood (ACCU-CHEK GUIDE) test strip 1 each by Other route in the morning, at noon, and at bedtime. Use as instructed   insulin glargine, 2 Unit Dial, (TOUJEO MAX SOLOSTAR) 300 UNIT/ML Solostar Pen Inject 95 Units into the skin every morning. And pen needles 1/day   Insulin Pen Needle (PEN NEEDLES) 32G X 4 MM MISC Check BS 2 x a day   JARDIANCE 10 MG TABS tablet TAKE 1 TABLET(10 MG) BY MOUTH DAILY BEFORE BREAKFAST   KLOR-CON M20 20 MEQ tablet TAKE 2 TABLETS BY MOUTH TWICE A DAY   L-Methylfolate 15 MG TABS Take 15 mg by mouth daily at 6 (six) AM.   losartan (COZAAR) 25 MG tablet TAKE 1 TABLET(25 MG) BY MOUTH DAILY   metFORMIN (GLUCOPHAGE-XR) 500 MG 24 hr tablet Take 2 tablets (1,000 mg total) by mouth daily with breakfast. (Patient taking differently: Take 1,000 mg by mouth daily with supper.)   metoCLOPramide (REGLAN) 5 MG tablet Take 1 tablet (5 mg total) by mouth every 8 (eight) hours as needed for nausea.   metoprolol tartrate (LOPRESSOR) 50 MG tablet Take 1 tablet (50 mg total) by mouth 2 (two) times daily. No more refills will be approved by  this provider as office is now permanently closed, patient will need to find another primary care provider.   Multiple Vitamins-Minerals (MULTIVITAMIN WOMEN 50+) TABS Take 1 tablet by mouth daily.   Pantoprazole Sodium (PROTONIX PO) Take 40 mg by mouth daily.   pregabalin (LYRICA) 75 MG capsule Take 75 mg by mouth at bedtime.   rosuvastatin (CRESTOR) 10 MG tablet Take 1 tablet (10 mg total) by mouth daily.   Saccharomyces boulardii (PROBIOTIC) 250 MG CAPS Take 1 capsule by mouth daily.   SUMAtriptan (IMITREX) 50 MG tablet  MAY REPEAT IN 2 HOURS IF HEADACHE PERSISTS OR RECURS. MAXIMUM 2 PER DAY.   traZODone (DESYREL) 50 MG tablet Take 50 mg by mouth at bedtime as needed.   No facility-administered encounter medications on file as of 02/05/2022.    Patient Active Problem List   Diagnosis Date Noted   Migraine with aura 02/01/2022   Encounter for general adult medical examination with abnormal findings 02/01/2022   Plantar fasciitis 02/01/2022   Chest pain 01/14/2022   Chronic migraine without aura without status migrainosus, not intractable 10/29/2021   Diabetic polyneuropathy associated with type 2 diabetes mellitus ([place]) 09/19/2021   History of hormone therapy 09/19/2021   Bilateral primary osteoarthritis of knee 04/10/2021   Perennial allergic rhinitis 04/06/2021   Allergic contact dermatitis 04/06/2021   Cholinergic urticaria 04/06/2021   Fibromyalgia syndrome 01/12/2021   Elevated C-reactive protein (CRP) 01/12/2021   Diverticulosis of colon without hemorrhage 01/02/2021   Major depressive disorder, single episode, severe ([place]) 07/14/2020   GERD (gastroesophageal reflux disease) 05/11/2020   Type 2 diabetes mellitus ([place]) 03/27/2020   Bipolar depression ([place]) 12/28/2019   Sleep apnea 12/28/2019   Tachycardia 10/31/2019   Hyperlipidemia 10/30/2015   Morbid obesity ([place]) 10/30/2015    Conditions to be addressed/monitored:HLD and DMII  Care Plan : RN Care Manager plan of care  Updates made by Kassie Mends, RN since 02/05/2022 12:00 AM     Problem: No plan of care established for management of chronic disease states- DM2, HLD   Priority: High     Long-Range Goal: Development of plan of care for chronic disease management - DM2, HLD   Start Date: 11/08/2021  Expected End Date: 05/07/2022  Priority: High  Note:    Current Barriers:  Knowledge Deficits related to plan of care for management of HLD and DMII  Care Coordination needs related to Mental Health Concerns  and Lacks knowledge of  community resource: Patient reports she has depression and is followed by psychiatrist and counselor regularly, declines services of CCM LCSW (PHQ9=10).  Chronic Disease Management support and education needs related to HLD and DMII- Patient lives with spouse, is mostly independent with ADL, IADL's, continues to drive, CBG now checked with Dexcom with fasting readings 130-200's range, random readings mid 120-200's range.  Patient can benefit from education related to ADA/ carbohydrate modified diet.  Patient reports she does not exercise, did receive flu vaccine.  Patient has chronic pain and saw pain specialist in the past but did not feel was helpful, pain varies day to day, takes medication which is helpful.  Pt reports she had to cancel her appointment with ENT, has finished antibiotics, continues to have nasal congestion, "runny nose" and has had some sinus pain, has improved but still continues to have issues and has rescheduled appointment.  RNCM Clinical Goal(s):  Patient will verbalize understanding of plan for management of HLD and DMII as evidenced by   verbalize basic understanding of  HLD and DMII disease process and self health management plan as evidenced by   attend all scheduled medical appointments:  2/14 Dr. Loanne Drilling as evidenced by patient verbalizing attended appointments/ chart review        demonstrate ongoing self health care management ability - calling provider for concerns, questions as evidenced by     patient report, review of EHR and through collaboration with RN Care manager, provider, and care team.   Interventions: 1:1 collaboration with primary care provider regarding development and update of comprehensive plan of care as evidenced by provider attestation and co-signature Inter-disciplinary care team collaboration (see longitudinal plan of care) Evaluation of current treatment plan related to  self management and patient's adherence to plan as established by  provider  Hyperlipidemia Interventions: Medication review performed; medication list updated in electronic medical record.  Reviewed role and benefits of statin for ASCVD risk reduction Reviewed importance of limiting foods high in cholesterol Reviewed exercise goals and target of 150 minutes per week Reinforced importance of getting outside daily in the sunshine and doing some type of exercise if able Reinforced heart healthy diet Pain assessment completed Reinforced relaxation and keeping stress to a minimum  Diabetes Interventions: Assessed patient's understanding of A1c goal: <7% Reviewed medications with patient and discussed importance of medication adherence Reviewed scheduled/upcoming provider appointments including:   Dr. Loanne Drilling Review of patient status, including review of consultants reports, relevant laboratory and other test results, and medications completed Reviewed plate method with patient Reinforced foods high in carbohydrates to limit, be mindful of intake  Lab Results  Component Value Date   HGBA1C 11.3 (A) 10/11/2021     Patient Goals/Self-Care Activities: Patient will attend all scheduled provider appointments as evidenced by clinician review of documented attendance to scheduled appointments and patient/caregiver report Patient will call provider office for new concerns or questions as evidenced by review of documented incoming telephone call notes and patient report Please call [facility] at [phone] for help with choosing the best insurance plan for your needs Reinforced checking blood sugar as prescribed and record, take to doctor's visits Avoid/ limit saturated/ trans fats- broil or bake foods instead of frying Practice portion control Get outside daily, try walking for exercise if you are able Keep stress under control, practice relaxation Follow carbohydrate modified diet/ plate method Continue working with your  psychiatrist and counselor Follow up with ENT provider       Plan:Telephone follow up appointment with care management team member scheduled for:  03/21/22  Jacqlyn Larsen [facility], BSN RN Case Manager [facility] [phone]

## 2022-02-05 NOTE — Patient Instructions (Signed)
Visit Information  Thank you for taking time to visit with me today. Please don't hesitate to contact me if I can be of assistance to you before our next scheduled telephone appointment.  Following are the goals we discussed today:  Patient will attend all scheduled provider appointments as evidenced by clinician review of documented attendance to scheduled appointments and patient/caregiver report Patient will call provider office for new concerns or questions as evidenced by review of documented incoming telephone call notes and patient report Please call [facility] at [phone] for help with choosing the best insurance plan for your needs Reinforced checking blood sugar as prescribed and record, take to doctor's visits Avoid/ limit saturated/ trans fats- broil or bake foods instead of frying Practice portion control Get outside daily, try walking for exercise if you are able Keep stress under control, practice relaxation Follow carbohydrate modified diet/ plate method Continue working with your psychiatrist and counselor Follow up with ENT provider  Our next appointment is by telephone on 03/21/22 at 1045 am  Please call the care guide team at [phone] if you need to cancel or reschedule your appointment.   If you are experiencing a Mental Health or [place] or need someone to talk to, please call the Suicide and Crisis Lifeline: 988 call the [place] National Suicide Prevention Lifeline: 551-448-8530 or TTY: 743-134-4186 TTY 414-356-9932) to talk to a trained counselor call 1-800-273-TALK (toll free, 24 hour hotline) go to [facility] [address], [place] [phone]) call 911   Patient verbalizes understanding of instructions and care plan provided today and agrees to view in [place]. Active MyChart status confirmed with patient.    Jacqlyn Larsen [facility], BSN RN Case Manager [facility] [phone]

## 2022-02-10 ENCOUNTER — Encounter: Payer: Self-pay | Admitting: Internal Medicine

## 2022-02-10 ENCOUNTER — Emergency Department (HOSPITAL_COMMUNITY)
Admission: EM | Admit: 2022-02-10 | Discharge: 2022-02-10 | Disposition: A | Discharge status: Home / Self Care | Payer: Medicare Other | Attending: Emergency Medicine | Admitting: Emergency Medicine

## 2022-02-10 ENCOUNTER — Emergency Department (HOSPITAL_COMMUNITY): Payer: Medicare Other

## 2022-02-10 ENCOUNTER — Other Ambulatory Visit: Payer: Self-pay

## 2022-02-10 ENCOUNTER — Encounter (HOSPITAL_COMMUNITY): Payer: Self-pay | Admitting: Emergency Medicine

## 2022-02-10 DIAGNOSIS — K859 Acute pancreatitis without necrosis or infection, unspecified: Secondary | ICD-10-CM | POA: Diagnosis not present

## 2022-02-10 DIAGNOSIS — R109 Unspecified abdominal pain: Secondary | ICD-10-CM | POA: Diagnosis present

## 2022-02-10 DIAGNOSIS — R079 Chest pain, unspecified: Secondary | ICD-10-CM | POA: Insufficient documentation

## 2022-02-10 LAB — CBC
HCT: 42.8 % (ref 36.0–46.0)
Hemoglobin: 14.1 g/dL (ref 12.0–15.0)
MCH: 30.4 pg (ref 26.0–34.0)
MCHC: 32.9 g/dL (ref 30.0–36.0)
MCV: 92.2 fL (ref 80.0–100.0)
Platelets: 304 10*3/uL (ref 150–400)
RBC: 4.64 MIL/uL (ref 3.87–5.11)
RDW: 13 % (ref 11.5–15.5)
WBC: 8.9 10*3/uL (ref 4.0–10.5)
nRBC: 0 % (ref 0.0–0.2)

## 2022-02-10 LAB — BASIC METABOLIC PANEL
Anion gap: 9 (ref 5–15)
BUN: 19 mg/dL (ref 6–20)
CO2: 27 mmol/L (ref 22–32)
Calcium: 9 mg/dL (ref 8.9–10.3)
Chloride: 100 mmol/L (ref 98–111)
Creatinine, Ser: 1.14 mg/dL — ABNORMAL HIGH (ref 0.44–1.00)
GFR, Estimated: 56 mL/min — ABNORMAL LOW (ref >60)
Glucose, Bld: 255 mg/dL — ABNORMAL HIGH (ref 70–99)
Potassium: 3.4 mmol/L — ABNORMAL LOW (ref 3.5–5.1)
Sodium: 136 mmol/L (ref 135–145)

## 2022-02-10 LAB — LIPASE, BLOOD: Lipase: 254 U/L — ABNORMAL HIGH (ref 11–51)

## 2022-02-10 LAB — CBG MONITORING, ED: Glucose-Capillary: 187 mg/dL — ABNORMAL HIGH (ref 70–99)

## 2022-02-10 LAB — TROPONIN I (HIGH SENSITIVITY): Troponin I (High Sensitivity): 3 ng/L (ref <18)

## 2022-02-10 MED ORDER — LIDOCAINE VISCOUS HCL 2 % MT SOLN
15.0000 mL | Freq: Once | OROMUCOSAL | Status: AC
Start: 2022-02-10 — End: 2022-02-10
  Administered 2022-02-10: 15 mL via ORAL
  Filled 2022-02-10: qty 15

## 2022-02-10 MED ORDER — ALUM & MAG HYDROXIDE-SIMETH 200-200-20 MG/5ML PO SUSP
30.0000 mL | Freq: Once | ORAL | Status: AC
Start: 2022-02-10 — End: 2022-02-10
  Administered 2022-02-10: 30 mL via ORAL
  Filled 2022-02-10: qty 30

## 2022-02-10 MED ORDER — ASPIRIN 81 MG PO CHEW
324.0000 mg | CHEWABLE_TABLET | Freq: Once | ORAL | Status: AC
Start: 2022-02-10 — End: 2022-02-10
  Administered 2022-02-10: 324 mg via ORAL
  Filled 2022-02-10: qty 4

## 2022-02-10 MED ORDER — HYDROCODONE-ACETAMINOPHEN 5-325 MG PO TABS: 1.0000 | ORAL_TABLET | ORAL | 0 refills | Status: DC | PRN

## 2022-02-10 NOTE — Discharge Instructions (Addendum)
You were seen today for abdominal pain.  Laboratory work revealed that you have pancreatitis.  I recommend a low-fat diet and lots of water.  Avoid alcohol.  I will provide a prescription for pain medication.  Follow-up with your primary care provider.  Return to the emergency department if you have chest pain, shortness of breath,  are unable to tolerate oral liquids, or have an altered level of consciousness

## 2022-02-10 NOTE — ED Triage Notes (Signed)
Pt c/o upper abd pain and mid chest pain that started about 3pm. Pt states she has hx of GERD so she took Gas-x, 0.5 mg xanax, and a few of tums but the pain didn't decrease.

## 2022-02-10 NOTE — ED Provider Notes (Signed)
[facility] EMERGENCY DEPARTMENT Provider Note   CSN: [id number] Arrival date & time: 02/10/22  1941     History  Chief Complaint  Patient presents with   Chest Pain    Sydney Taylor is a 56 y.o. female.  The patient presents to the emergency department complaining of chest pressure and abdominal pain.  The patient states that at approximately 4 PM this afternoon she noticed a bloated feeling in her stomach and began to have chest pressure and pain in the center of her chest.  She thought that it may be a case of indigestion and took Alka-Seltzer and Gas-X with no relief.  She states that she has been belching which has provided minimal relief.  She rates the pain at a 7 out of 10 currently and states that at its worst it was an 8 out of 10.  She denies radiation of symptoms.  She did take half of a Xanax to help with anxiety prior to arrival.  PMH significant for tachycardia, bipolar depression, type 2 diabetes, GERD, major depressive disorder, fibromyalgia, previous chest pain.  HPI     Home Medications Prior to Admission medications   Medication Sig Start Date End Date Taking? Authorizing Provider  HYDROcodone-acetaminophen (NORCO/VICODIN) 5-325 MG tablet Take 1-2 tablets by mouth every 4 (four) hours as needed for up to 3 days. 02/10/22 02/13/22 Yes Dorothyann Peng, PA  ALPRAZolam Duanne Moron) 0.5 MG tablet Take 0.5 mg by mouth daily as needed. 07/05/20   [provider]  blood glucose meter kit and supplies KIT Inject 1 each into the skin daily. Check blood glucose once a day. Diagnosis code: E 11.9. 02/21/21   Hurshel Party C, MD  buPROPion (WELLBUTRIN SR) 150 MG 12 hr tablet Take 150 mg by mouth daily.    [provider]  cetirizine (ZYRTEC ALLERGY) 10 MG tablet Take 1 tablet (10 mg total) by mouth daily. 01/07/22   Volney American, PA-C  chlorthalidone (HYGROTON) 25 MG tablet Take 0.5 tablets (12.5 mg total) by mouth daily. 11/08/21   Lindell Spar, MD   Cholecalciferol (VITAMIN D3) 125 MCG (5000 UT) TABS Take 2 tablets (10,000 Units total) by mouth daily. 01/22/21   Doree Albee, MD  Continuous Blood Gluc Sensor (DEXCOM G6 SENSOR) MISC by Does not apply route.    [provider]  Continuous Blood Gluc Transmit (DEXCOM G6 TRANSMITTER) MISC by Does not apply route.    [provider]  Cyanocobalamin (VITAMIN B12) 1000 MCG TBCR Take 1,000 mcg by mouth daily.    [provider]  DULoxetine (CYMBALTA) 60 MG capsule Take 60 mg by mouth daily. 05/14/21   [provider]  glipiZIDE (GLUCOTROL) 10 MG tablet TAKE 1 TABLET BY MOUTH TWICE DAILY BEFORE A MEAL 01/01/22   Renato Shin, MD  glucose blood (ACCU-CHEK GUIDE) test strip 1 each by Other route in the morning, at noon, and at bedtime. Use as instructed 08/07/21   Ailene Ards, NP  insulin glargine, 2 Unit Dial, (TOUJEO MAX SOLOSTAR) 300 UNIT/ML Solostar Pen Inject 95 Units into the skin every morning. And pen needles 1/day 01/17/22   Renato Shin, MD  Insulin Pen Needle (PEN NEEDLES) 32G X 4 MM MISC Check BS 2 x a day 10/22/21   Renato Shin, MD  JARDIANCE 10 MG TABS tablet TAKE 1 TABLET(10 MG) BY MOUTH DAILY BEFORE BREAKFAST 12/17/21   Patel, Colin Broach, MD  KLOR-CON M20 20 MEQ tablet TAKE 2 TABLETS BY MOUTH TWICE  A DAY 01/21/22   Lindell Spar, MD  L-Methylfolate 15 MG TABS Take 15 mg by mouth daily at 6 (six) AM.    [provider]  losartan (COZAAR) 25 MG tablet TAKE 1 TABLET(25 MG) BY MOUTH DAILY 01/14/22   Lindell Spar, MD  metFORMIN (GLUCOPHAGE-XR) 500 MG 24 hr tablet Take 2 tablets (1,000 mg total) by mouth daily with breakfast. Patient taking differently: Take 1,000 mg by mouth daily with supper. 10/11/21   Renato Shin, MD  metoCLOPramide (REGLAN) 5 MG tablet Take 1 tablet (5 mg total) by mouth every 8 (eight) hours as needed for nausea. 10/29/21   Lindell Spar, MD  metoprolol tartrate (LOPRESSOR) 50 MG tablet Take 1 tablet (50 mg total) by  mouth 2 (two) times daily. No more refills will be approved by this provider as office is now permanently closed, patient will need to find another primary care provider. 12/12/21   Lindell Spar, MD  Multiple Vitamins-Minerals (MULTIVITAMIN WOMEN 50+) TABS Take 1 tablet by mouth daily.    [provider]  Pantoprazole Sodium (PROTONIX PO) Take 40 mg by mouth daily.    [provider]  pregabalin (LYRICA) 75 MG capsule Take 75 mg by mouth at bedtime. 01/26/21   [provider]  rosuvastatin (CRESTOR) 10 MG tablet Take 1 tablet (10 mg total) by mouth daily. 11/07/21   Lindell Spar, MD  Saccharomyces boulardii (PROBIOTIC) 250 MG CAPS Take 1 capsule by mouth daily.    [provider]  SUMAtriptan (IMITREX) 50 MG tablet MAY REPEAT IN 2 HOURS IF HEADACHE PERSISTS OR RECURS. MAXIMUM 2 PER DAY. 11/28/21   Lindell Spar, MD  traZODone (DESYREL) 50 MG tablet Take 50 mg by mouth at bedtime as needed. 10/08/21   [provider]      Allergies    Cephalexin    Review of Systems   Review of Systems  Constitutional:  Negative for diaphoresis.  Respiratory:  Positive for chest tightness. Negative for shortness of breath.   Cardiovascular:  Positive for chest pain. Negative for leg swelling.  Gastrointestinal:  Positive for abdominal pain and nausea.  Skin:  Negative for color change.  Psychiatric/Behavioral:         Patient feels anxious   Physical Exam Updated Vital Signs BP 99/61    Pulse 91    Temp 97.9 F (36.6 C) (Oral)    Resp 18    Ht 5' 9"  (1.753 m)    Wt 117.5 kg    LMP  (LMP Unknown)    SpO2 94%    BMI 38.25 kg/m  Physical Exam Constitutional:      General: She is not in acute distress.    Appearance: She is obese.  HENT:     Head: Normocephalic.  Eyes:     Pupils: Pupils are equal, round, and reactive to light.  Cardiovascular:     Rate and Rhythm: Normal rate and regular rhythm.     Pulses:          Radial pulses are 2+ on the  right side and 2+ on the left side.       Posterior tibial pulses are 2+ on the right side and 2+ on the left side.     Heart sounds: Normal heart sounds.  Pulmonary:     Effort: Pulmonary effort is normal. No respiratory distress.     Breath sounds: Normal breath sounds.  Abdominal:     Palpations: Abdomen  is soft.     Tenderness: There is no abdominal tenderness.  Musculoskeletal:     Cervical back: Normal range of motion.  Skin:    General: Skin is warm and dry.  Neurological:     Mental Status: She is alert.    ED Results / Procedures / Treatments   Labs (all labs ordered are listed, but only abnormal results are displayed) Labs Reviewed  BASIC METABOLIC PANEL - Abnormal; Notable for the following components:      Result Value   Potassium 3.4 (*)    Glucose, Bld 255 (*)    Creatinine, Ser 1.14 (*)    GFR, Estimated 56 (*)    All other components within normal limits  LIPASE, BLOOD - Abnormal; Notable for the following components:   Lipase 254 (*)    All other components within normal limits  CBC  TROPONIN I (HIGH SENSITIVITY)    EKG EKG Interpretation  Date/Time:  Sunday February 10 2022 20:08:55 EST Ventricular Rate:  94 PR Interval:  144 QRS Duration: 87 QT Interval:  354 QTC Calculation: 443 R Axis:   83 Text Interpretation: Sinus rhythm Low voltage, precordial leads Borderline T abnormalities, anterior leads Confirmed by Noemi Chapel [id number]) on 02/10/2022 8:13:01 PM  Radiology DG Chest Port 1 View  Result Date: 02/10/2022 CLINICAL DATA:  Chest pain EXAM: PORTABLE CHEST 1 VIEW COMPARISON:  09/01/2020 FINDINGS: Heart and mediastinal contours are within normal limits. No focal opacities or effusions. No acute bony abnormality. IMPRESSION: No active cardiopulmonary disease. Electronically Signed   By: Rolm Baptise M.D.   On: 02/10/2022 20:16    Procedures Procedures    Medications Ordered in ED Medications  aspirin chewable tablet 324 mg (324 mg Oral  Given 02/10/22 2012)  alum & mag hydroxide-simeth (MAALOX/MYLANTA) 200-200-20 MG/5ML suspension 30 mL (30 mLs Oral Given 02/10/22 2019)    And  lidocaine (XYLOCAINE) 2 % viscous mouth solution 15 mL (15 mLs Oral Given 02/10/22 2019)    ED Course/ Medical Decision Making/ A&P                           Medical Decision Making Amount and/or Complexity of Data Reviewed Labs: ordered. Radiology: ordered.  Risk OTC drugs.   This patient presents to the ED for concern of chest pain, this involves an extensive number of treatment options, and is a complaint that carries with it a high risk of complications and morbidity.  The differential diagnosis includes but is not limited to ACS, anxiety, GERD, PE, and more   Co morbidities that complicate the patient evaluation  None   Additional history obtained:   External records from outside source obtained and reviewed including office note from 02/05/22, 02/01/22, and 01/14/22   Lab Tests:  I Ordered, and personally interpreted labs.  The pertinent results include:  Lipase 254   Imaging Studies ordered:  I ordered imaging studies including chest x-ray  I independently visualized and interpreted imaging which showed no active disease I agree with the radiologist interpretation   Cardiac Monitoring:  The patient was maintained on a cardiac monitor.  I personally viewed and interpreted the cardiac monitored which showed an underlying rhythm of: normal sinus rhythm   Medicines ordered and prescription drug management:  I ordered medication including a GI cocktail for symptoms of reflux, and ASA for antiplatelet activity  Reevaluation of the patient after these medicines showed that the patient improved I have reviewed the  patients home medicines and have made adjustments as needed   Test Considered:  CT abdomen   Critical Interventions:  None   Consultations Obtained:  None    Reevaluation:  After the interventions  noted above, I reevaluated the patient and found that they have :improved   Dispostion:  After consideration of the diagnostic results and the patients response to treatment, I feel that the patent would benefit from discharge. The patient has acute pancreatitis. Her lipase was elevated at 254. The patient has tolerable pain and is able to tolerate oral fluids. Her initial Troponin was 3 and the EKG showed no pattern of ischemia. I see no indication to admit to the hospital at this time.  I will discharge home and provide pain medication and dietary instructions.  Return precautions will be provided.   Final Clinical Impression(s) / ED Diagnoses Final diagnoses:  Acute pancreatitis, unspecified complication status, unspecified pancreatitis type    Rx / DC Orders ED Discharge Orders          Ordered    HYDROcodone-acetaminophen (NORCO/VICODIN) 5-325 MG tablet  Every 4 hours PRN        02/10/22 2217              Dorothyann Peng, [place] 02/10/22 2218    Noemi Chapel, MD 02/17/22 [phone]

## 2022-02-11 ENCOUNTER — Encounter: Payer: Self-pay | Admitting: Gastroenterology

## 2022-02-11 ENCOUNTER — Telehealth: Payer: Self-pay

## 2022-02-11 NOTE — Telephone Encounter (Signed)
noted 

## 2022-02-11 NOTE — Telephone Encounter (Signed)
Pt messaged Anna on [place] and was advised to call for appt to be evaluated. This is a RMR patient. She was seen in the ER for acute pancreatitis. Please call pt to schedule appt

## 2022-02-11 NOTE — Telephone Encounter (Signed)
Patient seen in ED with pancreatitis. Needs office visit, any APP. (RMR patient) Previously saw Randall Hiss.

## 2022-02-11 NOTE — Telephone Encounter (Signed)
OV made and appt letter mailed °

## 2022-02-12 ENCOUNTER — Telehealth (INDEPENDENT_AMBULATORY_CARE_PROVIDER_SITE_OTHER): Payer: Medicare Other | Admitting: Endocrinology

## 2022-02-12 ENCOUNTER — Encounter: Payer: Self-pay | Admitting: Endocrinology

## 2022-02-12 ENCOUNTER — Telehealth: Payer: Self-pay | Admitting: Internal Medicine

## 2022-02-12 ENCOUNTER — Other Ambulatory Visit: Payer: Self-pay

## 2022-02-12 ENCOUNTER — Encounter: Payer: Self-pay | Admitting: Internal Medicine

## 2022-02-12 ENCOUNTER — Ambulatory Visit (INDEPENDENT_AMBULATORY_CARE_PROVIDER_SITE_OTHER): Payer: Medicare Other | Admitting: Internal Medicine

## 2022-02-12 DIAGNOSIS — K859 Acute pancreatitis without necrosis or infection, unspecified: Secondary | ICD-10-CM | POA: Insufficient documentation

## 2022-02-12 DIAGNOSIS — E1165 Type 2 diabetes mellitus with hyperglycemia: Secondary | ICD-10-CM

## 2022-02-12 MED ORDER — TOUJEO MAX SOLOSTAR 300 UNIT/ML ~~LOC~~ SOPN: 106.0000 [IU] | PEN_INJECTOR | SUBCUTANEOUS | 3 refills | Status: DC

## 2022-02-12 MED ORDER — HYDROCODONE-ACETAMINOPHEN 5-325 MG PO TABS: 1.0000 | ORAL_TABLET | ORAL | 0 refills | Status: DC | PRN

## 2022-02-12 NOTE — Addendum Note (Signed)
Addended byIhor Dow on: 02/12/2022 10:45 AM   Modules accepted: Orders

## 2022-02-12 NOTE — Telephone Encounter (Signed)
Macy with [employer] [phone])    Called in on pt behalf in regard to   Wants to know if this is patients first time taking medication  of if patient has had this medication before.  Dosage is high for first time use, and if this is patients first time taking medication, pharm will need new prescription  for 1 tab every 4 hours as needed.

## 2022-02-12 NOTE — Assessment & Plan Note (Signed)
Recent ER visit, chart reviewed Advised to maintain adequate hydration Check RUQ [place] to rule out gallstones Does not take alcohol TG not very high to contribute to pancreatitis Followed by GI

## 2022-02-12 NOTE — Patient Instructions (Addendum)
check your blood sugar twice a day.  vary the time of day when you check, between before the 3 meals, and at bedtime.  also check if you have symptoms of your blood sugar being too high or too low.  please keep a record of the readings and bring it to your next appointment here (or you can bring the meter itself).  You can write it on any piece of paper.  please call us sooner if your blood sugar goes below 70, or if most of your readings are over 200.   I have sent a prescription to your pharmacy, to increase the Toujeo to 106 units per day.   Please continue the same other medications.   Please come back for a follow-up appointment in 1 month.

## 2022-02-12 NOTE — Progress Notes (Signed)
Subjective:    Patient ID: Sydney Taylor, female    DOB: September 30, 1966, 56 y.o.   MRN: [id number]  HPI telehealth visit today via video visit.  Alternatives to telehealth are presented to this patient, and the patient agrees to the telehealth visit. Pt is advised of the cost of the visit, and agrees to this, also.   Patient is at home, and I am at the office.   Persons attending the telehealth visit: the patient and I Pt returns for f/u of diabetes mellitus: DM type: Insulin-requiring type 2 Dx'ed: 9767 Complications: PN Therapy: insulin since 2022, and 3 oral meds.   GDM: never DKA: never Severe hypoglycemia: never Pancreatitis: never Pancreatic imaging: normal on 2022 CT SDOH: therapy has been limited by pt's request for least expensive meds.   Other: she takes QD insulin, at least for now.   Interval hx: She was in the ER 2 days ago with acute pancreatitis.  pt states she feels much better in general.  Lantus is 96 units qd.  Pt says cbg varies from 120-275.  It is in general lowest fasting.  Trulicity was stopped.   Past Medical History:  Diagnosis Date   Anemia    Anxiety    Arrhythmia    Bipolar depression ([place]) 12/28/2019   Chronic fatigue    Chronic headaches    Depression    Diabetes mellitus without complication (HCC)    Fibromyalgia    GERD (gastroesophageal reflux disease)    High cholesterol    HLD (hyperlipidemia)    Hypertension    Insect bite    Migraine    Prediabetes    Sleep apnea    Urinary incontinence     Past Surgical History:  Procedure Laterality Date   ESOPHAGOGASTRODUODENOSCOPY N/A 05/12/2020   Procedure: ESOPHAGOGASTRODUODENOSCOPY (EGD);  Surgeon: Daneil Dolin, MD;  Location: [facility];  Service: Endoscopy;  Laterality: N/A;  11:15am   Joint fusion on foot Right    MALONEY DILATION N/A 05/12/2020   Procedure: MALONEY DILATION;  Surgeon: Daneil Dolin, MD;  Location: [facility] END[facility] SU[facility];  Service: Endoscopy;  Laterality: N/A;   NASAL  SEPTUM SURGERY     SINOSCOPY      Social History   Socioeconomic History   Marital status: Married    Spouse name: Not on file   Number of children: 0   Years of education: Not on file   Highest education level: Bachelor's degree (e.g., BA, AB, BS)  Occupational History   Occupation: research associate/drug development  Tobacco Use   Smoking status: Former    Types: Cigarettes    Quit date: 12/30/1982    Years since quitting: 39.1   Smokeless tobacco: Never  Vaping Use   Vaping Use: Never used  Substance and Sexual Activity   Alcohol use: Not Currently    Alcohol/week: 0.0 standard drinks   Drug use: Never   Sexual activity: Yes    Birth control/protection: Post-menopausal  Other Topics Concern   Not on file  Social History Narrative   Married for 3 years.Lives with husband.Bachelors in Education officer, museum.   Right handed   Caffeine: 1-2 per day    Social Determinants of Health   Financial Resource Strain: Not on file  Food Insecurity: No Food Insecurity   Worried About Charity fundraiser in the Last Year: Never true   Ran Out of Food in the Last Year: Never true  Transportation Needs: No Transportation Needs   Lack of Transportation (  Medical): No   Lack of Transportation (Non-Medical): No  Physical Activity: Not on file  Stress: Not on file  Social Connections: Not on file  Intimate Partner Violence: Not on file    Current Outpatient Medications on File Prior to Visit  Medication Sig Dispense Refill   ALPRAZolam (XANAX) 0.5 MG tablet Take 0.5 mg by mouth daily as needed.     blood glucose meter kit and supplies KIT Inject 1 each into the skin daily. Check blood glucose once a day. Diagnosis code: E 11.9. 1 each 0   buPROPion (WELLBUTRIN SR) 150 MG 12 hr tablet Take 150 mg by mouth daily.     cetirizine (ZYRTEC ALLERGY) 10 MG tablet Take 1 tablet (10 mg total) by mouth daily. 30 tablet 1   chlorthalidone (HYGROTON) 25 MG tablet Take 0.5 tablets (12.5 mg total) by mouth  daily. 45 tablet 1   Cholecalciferol (VITAMIN D3) 125 MCG (5000 UT) TABS Take 2 tablets (10,000 Units total) by mouth daily. 180 tablet 1   Continuous Blood Gluc Sensor (DEXCOM G6 SENSOR) MISC by Does not apply route.     Continuous Blood Gluc Transmit (DEXCOM G6 TRANSMITTER) MISC by Does not apply route.     Cyanocobalamin (VITAMIN B12) 1000 MCG TBCR Take 1,000 mcg by mouth daily.     DULoxetine (CYMBALTA) 60 MG capsule Take 60 mg by mouth daily.     glipiZIDE (GLUCOTROL) 10 MG tablet TAKE 1 TABLET BY MOUTH TWICE DAILY BEFORE A MEAL 180 tablet 0   glucose blood (ACCU-CHEK GUIDE) test strip 1 each by Other route in the morning, at noon, and at bedtime. Use as instructed 300 each 3   Insulin Pen Needle (PEN NEEDLES) 32G X 4 MM MISC Check BS 2 x a day 100 each 5   JARDIANCE 10 MG TABS tablet TAKE 1 TABLET(10 MG) BY MOUTH DAILY BEFORE BREAKFAST 30 tablet 2   KLOR-CON M20 20 MEQ tablet TAKE 2 TABLETS BY MOUTH TWICE A DAY 360 tablet 1   L-Methylfolate 15 MG TABS Take 15 mg by mouth daily at 6 (six) AM.     losartan (COZAAR) 25 MG tablet TAKE 1 TABLET(25 MG) BY MOUTH DAILY 90 tablet 0   metFORMIN (GLUCOPHAGE-XR) 500 MG 24 hr tablet Take 2 tablets (1,000 mg total) by mouth daily with breakfast. (Patient taking differently: Take 1,000 mg by mouth daily with supper.) 180 tablet 3   metoCLOPramide (REGLAN) 5 MG tablet Take 1 tablet (5 mg total) by mouth every 8 (eight) hours as needed for nausea. 30 tablet 0   metoprolol tartrate (LOPRESSOR) 50 MG tablet Take 1 tablet (50 mg total) by mouth 2 (two) times daily. No more refills will be approved by this provider as office is now permanently closed, patient will need to find another primary care provider. 180 tablet 0   Multiple Vitamins-Minerals (MULTIVITAMIN WOMEN 50+) TABS Take 1 tablet by mouth daily.     Pantoprazole Sodium (PROTONIX PO) Take 40 mg by mouth daily.     pregabalin (LYRICA) 75 MG capsule Take 75 mg by mouth at bedtime.     rosuvastatin  (CRESTOR) 10 MG tablet Take 1 tablet (10 mg total) by mouth daily. 30 tablet 11   Saccharomyces boulardii (PROBIOTIC) 250 MG CAPS Take 1 capsule by mouth daily.     SUMAtriptan (IMITREX) 50 MG tablet MAY REPEAT IN 2 HOURS IF HEADACHE PERSISTS OR RECURS. MAXIMUM 2 PER DAY. 10 tablet 0   traZODone (DESYREL) 50 MG tablet  Take 50 mg by mouth at bedtime as needed.     No current facility-administered medications on file prior to visit.    Allergies  Allergen Reactions   Cephalexin Itching, Swelling and Other (See Comments)    Family History  Problem Relation Age of Onset   Hypertension Father    Hyperlipidemia Father    Diabetes Father    Stroke Father    Depression Father    Anxiety disorder Father    Hypertension Mother    Hyperlipidemia Mother    Diabetes Mellitus II Mother    Congestive Heart Failure Mother    Heart attack Mother    Hypertension Sister    Diabetes Sister    Hypertension Brother    Autoimmune disease Brother    Diabetes Maternal Grandmother    Lupus Paternal Grandmother    Depression Paternal Grandmother    Anxiety disorder Paternal Grandmother    Depression Maternal Aunt    Anxiety disorder Maternal Aunt    Colon cancer Neg Hx    Esophageal cancer Neg Hx    Gastric cancer Neg Hx     LMP  (LMP Unknown)   Review of Systems     Objective:   Physical Exam    Lab Results  Component Value Date   CREATININE 1.14 (H) 02/10/2022   BUN 19 02/10/2022   NA 136 02/10/2022   K 3.4 (L) 02/10/2022   CL 100 02/10/2022   CO2 27 02/10/2022    Lab Results  Component Value Date   CHOL 136 12/14/2021   HDL 36 (L) 12/14/2021   LDLCALC 68 12/14/2021   TRIG 189 (H) 12/14/2021   CHOLHDL 3.8 12/14/2021      Assessment & Plan:  Insulin-requiring type 2 DM: uncontrolled  Patient Instructions  check your blood sugar twice a day.  vary the time of day when you check, between before the 3 meals, and at bedtime.  also check if you have symptoms of your blood  sugar being too high or too low.  please keep a record of the readings and bring it to your next appointment here (or you can bring the meter itself).  You can write it on any piece of paper.  please call us sooner if your blood sugar goes below 70, or if most of your readings are over 200.   I have sent a prescription to your pharmacy, to increase the Toujeo to 106 units per day.   Please continue the same other medications.   Please come back for a follow-up appointment in 1 month.

## 2022-02-12 NOTE — Progress Notes (Signed)
Virtual Visit via Telephone Note   This visit type was conducted due to national recommendations for restrictions regarding the COVID-19 Pandemic (e.g. social distancing) in an effort to limit this patient's exposure and mitigate transmission in our community.  Due to her co-morbid illnesses, this patient is at least at moderate risk for complications without adequate follow up.  This format is felt to be most appropriate for this patient at this time.  The patient did not have access to video technology/had technical difficulties with video requiring transitioning to audio format only (telephone).  All issues noted in this document were discussed and addressed.  No physical exam could be performed with this format.  Evaluation Performed:  Follow-up visit  Date:  02/12/2022   ID:  Sydney Taylor, DOB 07/29/1966, MRN [id number]  Patient Location: Home Provider Location: Office/Clinic  Participants: Patient Location of Patient: Home Location of Provider: Telehealth Consent was obtain for visit to be over via telehealth. I verified that I am speaking with the correct person using two identifiers.  PCP:  Lindell Spar, MD   Chief Complaint: Follow up after recent ER visit  History of Present Illness:    Sydney Taylor is a 55 y.o. female who has a televisit for follow up after recent ER visit for acute pancreatitis.  She had epigastric pain with nausea and chest discomfort on the day of ER visit, which lasted for 4 hours before she went to ER.  Her lipase was elevated in the ER.  She did not get imaging as her pain had improved with hydration and was tolerating oral fluids.  She was given Norco, but her pharmacy did not have it.  Her epigastric pain has improved now.  Denies any vomiting.  The patient does not have symptoms concerning for COVID-19 infection (fever, chills, cough, or new shortness of breath).   Past Medical, Surgical, Social History, Allergies, and Medications have  been Reviewed.  Past Medical History:  Diagnosis Date   Anemia    Anxiety    Arrhythmia    Bipolar depression ([place]) 12/28/2019   Chronic fatigue    Chronic headaches    Depression    Diabetes mellitus without complication (HCC)    Fibromyalgia    GERD (gastroesophageal reflux disease)    High cholesterol    HLD (hyperlipidemia)    Hypertension    Insect bite    Migraine    Prediabetes    Sleep apnea    Urinary incontinence    Past Surgical History:  Procedure Laterality Date   ESOPHAGOGASTRODUODENOSCOPY N/A 05/12/2020   Procedure: ESOPHAGOGASTRODUODENOSCOPY (EGD);  Surgeon: Daneil Dolin, MD;  [facility];  Service: Endoscopy;  Laterality: N/A;  11:15am   Joint fusion on foot Right    MALONEY DILATION N/A 05/12/2020   Procedure: MALONEY DILATION;  Surgeon: Daneil Dolin, MD;  [facility]: [facility];  Service: Endoscopy;  Laterality: N/A;   NASAL SEPTUM SURGERY     SINOSCOPY       Current Meds  Medication Sig   ALPRAZolam (XANAX) 0.5 MG tablet Take 0.5 mg by mouth daily as needed.   blood glucose meter kit and supplies KIT Inject 1 each into the skin daily. Check blood glucose once a day. Diagnosis code: E 11.9.   buPROPion (WELLBUTRIN SR) 150 MG 12 hr tablet Take 150 mg by mouth daily.   cetirizine (ZYRTEC ALLERGY) 10 MG tablet Take 1 tablet (10 mg total) by mouth daily.  chlorthalidone (HYGROTON) 25 MG tablet Take 0.5 tablets (12.5 mg total) by mouth daily.   Cholecalciferol (VITAMIN D3) 125 MCG (5000 UT) TABS Take 2 tablets (10,000 Units total) by mouth daily.   Continuous Blood Gluc Sensor (DEXCOM G6 SENSOR) MISC by Does not apply route.   Continuous Blood Gluc Transmit (DEXCOM G6 TRANSMITTER) MISC by Does not apply route.   Cyanocobalamin (VITAMIN B12) 1000 MCG TBCR Take 1,000 mcg by mouth daily.   DULoxetine (CYMBALTA) 60 MG capsule Take 60 mg by mouth daily.   glipiZIDE (GLUCOTROL) 10 MG tablet TAKE 1 TABLET BY MOUTH TWICE DAILY BEFORE A MEAL    glucose blood (ACCU-CHEK GUIDE) test strip 1 each by Other route in the morning, at noon, and at bedtime. Use as instructed   insulin glargine, 2 Unit Dial, (TOUJEO MAX SOLOSTAR) 300 UNIT/ML Solostar Pen Inject 95 Units into the skin every morning. And pen needles 1/day   Insulin Pen Needle (PEN NEEDLES) 32G X 4 MM MISC Check BS 2 x a day   JARDIANCE 10 MG TABS tablet TAKE 1 TABLET(10 MG) BY MOUTH DAILY BEFORE BREAKFAST   KLOR-CON M20 20 MEQ tablet TAKE 2 TABLETS BY MOUTH TWICE A DAY   L-Methylfolate 15 MG TABS Take 15 mg by mouth daily at 6 (six) AM.   losartan (COZAAR) 25 MG tablet TAKE 1 TABLET(25 MG) BY MOUTH DAILY   metFORMIN (GLUCOPHAGE-XR) 500 MG 24 hr tablet Take 2 tablets (1,000 mg total) by mouth daily with breakfast. (Patient taking differently: Take 1,000 mg by mouth daily with supper.)   metoCLOPramide (REGLAN) 5 MG tablet Take 1 tablet (5 mg total) by mouth every 8 (eight) hours as needed for nausea.   metoprolol tartrate (LOPRESSOR) 50 MG tablet Take 1 tablet (50 mg total) by mouth 2 (two) times daily. No more refills will be approved by this provider as office is now permanently closed, patient will need to find another primary care provider.   Multiple Vitamins-Minerals (MULTIVITAMIN WOMEN 50+) TABS Take 1 tablet by mouth daily.   Pantoprazole Sodium (PROTONIX PO) Take 40 mg by mouth daily.   pregabalin (LYRICA) 75 MG capsule Take 75 mg by mouth at bedtime.   rosuvastatin (CRESTOR) 10 MG tablet Take 1 tablet (10 mg total) by mouth daily.   Saccharomyces boulardii (PROBIOTIC) 250 MG CAPS Take 1 capsule by mouth daily.   SUMAtriptan (IMITREX) 50 MG tablet MAY REPEAT IN 2 HOURS IF HEADACHE PERSISTS OR RECURS. MAXIMUM 2 PER DAY.   traZODone (DESYREL) 50 MG tablet Take 50 mg by mouth at bedtime as needed.     Allergies:   Cephalexin   ROS:   Please see the history of present illness.     All other systems reviewed and are negative.   Labs/Other Tests and Data Reviewed:     Recent Labs: 12/14/2021: ALT 19; TSH 2.220 02/10/2022: BUN 19; Creatinine, Ser 1.14; Hemoglobin 14.1; Platelets 304; Potassium 3.4; Sodium 136   Recent Lipid Panel Lab Results  Component Value Date/Time   CHOL 136 12/14/2021 10:12 AM   TRIG 189 (H) 12/14/2021 10:12 AM   HDL 36 (L) 12/14/2021 10:12 AM   CHOLHDL 3.8 12/14/2021 10:12 AM   CHOLHDL 5.2 (H) 01/21/2020 10:09 AM   LDLCALC 68 12/14/2021 10:12 AM   LDLCALC 133 (H) 01/21/2020 10:09 AM    Wt Readings from Last 3 Encounters:  02/10/22 259 lb 0.7 oz (117.5 kg)  02/01/22 259 lb 0.6 oz (117.5 kg)  01/16/22 262 lb (118.8 kg)  ASSESSMENT & PLAN:    Acute pancreatitis Recent ER visit, chart reviewed Advised to maintain adequate hydration Check RUQ [place] to rule out gallstones Does not take alcohol TG not very high to contribute to pancreatitis Followed by GI Refilled Norco  Time:   Today, I have spent 16 minutes reviewing the chart, including problem list, medications, and with the patient with telehealth technology discussing the above problems.   Medication Adjustments/Labs and Tests Ordered: Current medicines are reviewed at length with the patient today.  Concerns regarding medicines are outlined above.   Tests Ordered: No orders of the defined types were placed in this encounter.   Medication Changes: No orders of the defined types were placed in this encounter.    Note: This dictation was prepared with Dragon dictation along with smaller phrase technology. Similar sounding words can be transcribed inadequately or may not be corrected upon review. Any transcriptional errors that result from this process are unintentional.      Disposition:  Follow up  Signed, Lindell Spar, MD  02/12/2022 9:12 AM     [facility]

## 2022-02-13 ENCOUNTER — Ambulatory Visit
Admission: RE | Admit: 2022-02-13 | Discharge: 2022-02-13 | Disposition: A | Discharge status: Home / Self Care | Payer: Medicare Other | Source: Ambulatory Visit

## 2022-02-13 DIAGNOSIS — R928 Other abnormal and inconclusive findings on diagnostic imaging of breast: Secondary | ICD-10-CM

## 2022-02-21 ENCOUNTER — Ambulatory Visit (HOSPITAL_COMMUNITY): Payer: Medicare Other

## 2022-02-25 NOTE — Progress Notes (Signed)
? ?Cardiology Office Note ? ? ?Date:  02/28/2022  ? ?ID:  Sydney Taylor, DOB 08/01/1966, MRN [id number] ? ?PCP:  Patel, Rutwik K, MD  ?Cardiologist:    , MD  ? ?Pt referred by Dr Gosrani for tachycardia     ?  ?History of Present Illness: ?Sydney Taylor is a 56 y.o. female with a history OSA, DM, bipolar disorder   SHe complains  of dizziness   Like world spinning.   Usually with driving  No hx of syncope   ?Pt also has a history of palpitations   Occurred for several months  Palpitations usually occur with exertion.  Feel like normal heart beats except harder   SHe denies racing of heart while at rest   No dizziness with this   No syncope   ?  Pt notes  occsaional CP She describes pains lIke electric shocks  SPells last seconds       ? ? Saw the pt in clinic in Jan 2021   She was seen as a televisit in summer 2021 ?Seein in ED on 02/10/22 ?For months had problems with indigestion   That day had pain in abdomen and chest    Went to ED   Given GI cocktail   Helped a lot    Lipase 254    Other labs OK   ?Since then haasnt had CP    Has had mild nauseas, abdominal pain     ? ? ?Br:   Crumpet.  Mc[employer]uffin  Banana ?Lunch:   Pizza    ?Dinner    Salad and  ? ? ?Current Meds  ?Medication Sig  ? ALPRAZolam (XANAX) 0.5 MG tablet Take 0.5 mg by mouth daily as needed.  ? blood glucose meter kit and supplies KIT Inject 1 each into the skin daily. Check blood glucose once a day. Diagnosis code: E 11.9.  ? buPROPion ER (WELLBUTRIN SR) 100 MG 12 hr tablet Take 100 mg by mouth daily.  ? chlorthalidone (HYGROTON) 25 MG tablet Take 0.5 tablets (12.5 mg total) by mouth daily.  ? Cholecalciferol (VITAMIN D3) 125 MCG (5000 UT) TABS Take 2 tablets (10,000 Units total) by mouth daily.  ? Continuous Blood Gluc Sensor (DEXCOM G6 SENSOR) MISC by Does not apply route.  ? Continuous Blood Gluc Transmit (DEXCOM G6 TRANSMITTER) MISC by Does not apply route.  ? Cyanocobalamin (VITAMIN B12) 1000 MCG TBCR Take 1,000 mcg by mouth daily.   ? DULoxetine (CYMBALTA) 30 MG capsule 1 CAPSULE IN THE AFTERNOON (CONTINUE 60 MG IN THE MORNING FOR TOTAL 90 MG DAILY) ORALLY ONCE A DAY  ? DULoxetine (CYMBALTA) 60 MG capsule Take 60 mg by mouth every morning.  ? glipiZIDE (GLUCOTROL) 10 MG tablet TAKE 1 TABLET BY MOUTH TWICE DAILY BEFORE A MEAL  ? glucose blood (ACCU-CHEK GUIDE) test strip 1 each by Other route in the morning, at noon, and at bedtime. Use as instructed  ? insulin glargine, 2 Unit Dial, (TOUJEO MAX SOLOSTAR) 300 UNIT/ML Solostar Pen Inject 106 Units into the skin every morning. And pen needles 1/day  ? Insulin Pen Needle (PEN NEEDLES) 32G X 4 MM MISC Check BS 2 x a day  ? JARDIANCE 10 MG TABS tablet TAKE 1 TABLET(10 MG) BY MOUTH DAILY BEFORE BREAKFAST  ? KLOR-CON M20 20 MEQ tablet TAKE 2 TABLETS BY MOUTH TWICE A DAY (Patient taking differently: Take 40 mEq by mouth daily.)  ? L-Methylfolate 15 MG TABS Take 15 mg by mouth daily   at 6 (six) AM.  ? losartan (COZAAR) 25 MG tablet TAKE 1 TABLET(25 MG) BY MOUTH DAILY  ? metFORMIN (GLUCOPHAGE-XR) 500 MG 24 hr tablet Take 2 tablets (1,000 mg total) by mouth daily with breakfast. (Patient taking differently: Take 1,000 mg by mouth daily with supper.)  ? metoprolol tartrate (LOPRESSOR) 50 MG tablet Take 1 tablet (50 mg total) by mouth 2 (two) times daily. No more refills will be approved by this provider as office is now permanently closed, patient will need to find another primary care provider.  ? Multiple Vitamins-Minerals (MULTIVITAMIN WOMEN 50+) TABS Take 1 tablet by mouth daily.  ? Pantoprazole Sodium (PROTONIX PO) Take 40 mg by mouth daily.  ? rosuvastatin (CRESTOR) 10 MG tablet Take 1 tablet (10 mg total) by mouth daily. (Patient taking differently: Take 10 mg by mouth every other day.)  ? Saccharomyces boulardii (PROBIOTIC) 250 MG CAPS Take 1 capsule by mouth daily.  ? traZODone (DESYREL) 50 MG tablet Take 12.5 mg by mouth at bedtime as needed.  ? ? ? ?Allergies:   Cephalexin  ? ?Past Medical  History:  ?Diagnosis Date  ? Anemia   ? Anxiety   ? Arrhythmia   ? Bipolar depression (HCC) 12/28/2019  ? Chronic fatigue   ? Chronic headaches   ? Depression   ? Diabetes mellitus without complication (HCC)   ? Fibromyalgia   ? GERD (gastroesophageal reflux disease)   ? High cholesterol   ? HLD (hyperlipidemia)   ? Hypertension   ? Insect bite   ? Migraine   ? Prediabetes   ? Sleep apnea   ? Urinary incontinence   ? ? ?Past Surgical History:  ?Procedure Laterality Date  ? ESOPHAGOGASTRODUODENOSCOPY N/A 05/12/2020  ? Procedure: ESOPHAGOGASTRODUODENOSCOPY (EGD);  Surgeon: Rourk, Robert M, MD;  [facility];  Service: Endoscopy;  Laterality: N/A;  11:15am  ? Joint fusion on foot Right   ? MALONEY DILATION N/A 05/12/2020  ? Procedure: MALONEY DILATION;  Surgeon: Rourk, Robert M, MD;  [facility];  Service: Endoscopy;  Laterality: N/A;  ? NASAL SEPTUM SURGERY    ? SINOSCOPY    ? ? ? ?Social History:  The patient  reports that she quit smoking about 39 years ago. Her smoking use included cigarettes. She has never used smokeless tobacco. She reports that she does not currently use alcohol. She reports that she does not use drugs.  ? ?Family History:  The patient's family history includes Anxiety disorder in her father, maternal aunt, and paternal grandmother; Autoimmune disease in her brother; Congestive Heart Failure in her mother; Depression in her father, maternal aunt, and paternal grandmother; Diabetes in her father, maternal grandmother, and sister; Diabetes Mellitus II in her mother; Heart attack in her mother; Hyperlipidemia in her father and mother; Hypertension in her brother, father, mother, and sister; Lupus in her paternal grandmother; Stroke in her father.  ? ? ?ROS:  Please see the history of present illness. All other systems are reviewed and  Negative to the above problem except as noted.  ? ? ?PHYSICAL EXAM: ?VS:  BP 112/70   Pulse 86   Ht 5' 9" (1.753 m)   Wt 264 lb 12.8 oz  (120.1 kg)   LMP  (LMP Unknown)   SpO2 97%   BMI 39.10 kg/m?   ?GEN: Morbidly obese 56 yo  in no acute distress  ?HEENT: normal  ?Neck: no JVD, carotid bruits ?Cardiac: RRR; no murmurs, rubs, or gallops,no edema  ?Respiratory:    clear to auscultation bilaterally ?GI: soft, Mild RUQ tenderness  No rebound  + BS  No hepatomegaly  ?MS: no deformity Moving all extremities   ?Skin: warm and dry, no rash ?Neuro:  Strength and sensation are intact ?Psych: euthymic mood, full affect ? ? ?EKG:  EKG is not ordered today.   ? ?Lipid Panel ?   ?Component Value Date/Time  ? CHOL 136 12/14/2021 1012  ? TRIG 189 (H) 12/14/2021 1012  ? HDL 36 (L) 12/14/2021 1012  ? CHOLHDL 3.8 12/14/2021 1012  ? CHOLHDL 5.2 (H) 01/21/2020 1009  ? LDLCALC 68 12/14/2021 1012  ? LDLCALC 133 (H) 01/21/2020 1009  ? ?  ? ?Wt Readings from Last 3 Encounters:  ?02/28/22 264 lb 12.8 oz (120.1 kg)  ?02/10/22 259 lb 0.7 oz (117.5 kg)  ?02/01/22 259 lb 0.6 oz (117.5 kg)  ?  ? ? ?ASSESSMENT AND PLAN: ? ?1  Chest pain   Atypical   I think related to pancreatitis ? ?2  Tachycardia    She had been tachycardic in the past   Also skips   She is on metoprolol   Can decrease to 25 bid   Get Zio patch   If average HR OK and feels OK can pull off and follow  ? ?3  Hx HTN   Will need to follow as taper meds   Consier other meds that don't slow down, cause drowsiness ? ?4   OSA   Pt had been on CPAP   ? ?5   Morbid obesity   Discussed diet   Limit carbs   ? ?F/U in 1 year    ? ?Current medicines are reviewed at length with the patient today.  The patient does not have concerns regarding medicines. ? ?Signed, ? , MD  ?02/28/2022 2:24 PM    ?[facility] ?[address], [place], [place]  [address] ?Phone: [phone]; Fax: [phone]  ? ? ?

## 2022-02-27 ENCOUNTER — Ambulatory Visit (INDEPENDENT_AMBULATORY_CARE_PROVIDER_SITE_OTHER): Payer: Medicare Other | Admitting: Pharmacist

## 2022-02-27 ENCOUNTER — Other Ambulatory Visit: Payer: Self-pay

## 2022-02-27 ENCOUNTER — Ambulatory Visit (HOSPITAL_COMMUNITY)
Admission: RE | Admit: 2022-02-27 | Discharge: 2022-02-27 | Disposition: A | Discharge status: Home / Self Care | Payer: Medicare Other | Source: Ambulatory Visit | Attending: Internal Medicine | Admitting: Internal Medicine

## 2022-02-27 DIAGNOSIS — E1165 Type 2 diabetes mellitus with hyperglycemia: Secondary | ICD-10-CM

## 2022-02-27 DIAGNOSIS — E782 Mixed hyperlipidemia: Secondary | ICD-10-CM

## 2022-02-27 DIAGNOSIS — K859 Acute pancreatitis without necrosis or infection, unspecified: Secondary | ICD-10-CM | POA: Diagnosis not present

## 2022-02-27 NOTE — Patient Instructions (Signed)
Sydney Taylor, ? ?It was great to talk to you today! ? ?Please call me with any questions or concerns. ? ?Visit Information ? ?Following are the goals we discussed today:  ? Goals Addressed   ? ?  ?  ?  ?  ? This Visit's Progress  ?  Medication Management     ?  Patient Goals/Self-Care Activities ?Patient will:  ?Take medications as prescribed ?Check blood sugar continuously with continuous glucose monitor, document, and provide at future appointments ?Engage in dietary modifications by less frequent dining out and fewer sweetened foods & beverages ? ? ? ? ?  ? ?  ?  ? ?Follow-up plan: Next PCP appointment scheduled for: 06/03/22 ? ?Patient verbalizes understanding of instructions and care plan provided today and agrees to view in [place]. Active MyChart status confirmed with patient.   ? ?Please call the care guide team at [phone] if you need to cancel or reschedule your appointment.  ? ?Kennon Holter, PharmD, BCACP, CPP ?Clinical Pharmacist Practitioner ?[place] ?[phone]  ?

## 2022-02-27 NOTE — Chronic Care Management (AMB) (Signed)
Chronic Care Management Pharmacy Note  02/27/2022 Name:  Sydney Taylor MRN:  [id number] DOB:  Nov 24, 1966  Summary: Completed thorough medication list update  Type 2 Diabetes (Followed by Dr. Loanne Drilling) Uncontrolled but improving; A1c 12.4>8.5 over last several months; Most recent A1c remains above goal of <7% per ADA guidelines. Suspect high levels of insulin resistance.  Current medications: Toujeo 106 units subcutaneously once daily (recently increased by Dr. Loanne Drilling), empagliflozin (Jardiance) 10 mg by mouth once daily, metformin 1,000 mg by mouth daily with supper, and glipizide IR 10 mg by mouth twice daily with meals Recent changes: Dr. Loanne Drilling switched patient to concentrated insulin as recommended Intolerances:  GI upset/drowsiness with metformin more than 1,000 mg/day; nausea and vomiting with Trulicity; will avoid GLP-1 agonists due to recent pancreatitis Current glucose readings: See Dexcom report below for last 30 days. Continued improvement in blood glucose. Time in range 43%. Time above range 56%. No hypoglycemia.  Continue above medications per endocrinology. One consideration for change might be to consider adding prandial insulin at this point. Patient is receiving 0.9 units/kg of basal insulin which is considered over basalization. Additionally, could also consider addition of pioglitazone to increase insulin sensitivity (consider risk/benefit). Will send message to endocrinology to consider above recommendations. Will defer to endocrinology.  May be reasonable to consider discontinuation of glipizide once A1c approaches goal to prevent hypoglycemia.      Hyperlipidemia Triglycerides above goal of <150 per 2020 AACE/ACE guidelines. Current medications: rosuvastatin 10 mg by mouth every other day (patient reports primary care provider told her to decrease due to muscle aches) Intolerances:  muscle aches in legs with daily dosing of statin Continue rosuvastatin 10 mg by  mouth every other day Triglycerides will likely improve as blood glucose continues to improve. May consider Vascepa for cardiovascular risk reduction although patient does not currently meet inclusion criteria per REDUCE-IT trial. She has diabetes but no additional major risk factors at this time.   Subjective: Sydney Taylor is an 56 y.o. year old female who is a primary patient of Lindell Spar, MD.  The CCM team was consulted for assistance with disease management and care coordination needs.    Engaged with patient by telephone for follow up visit in response to provider referral for pharmacy case management and/or care coordination services.   Consent to Services:  The patient was given information about Chronic Care Management services, agreed to services, and gave verbal consent prior to initiation of services.  Please see initial visit note for detailed documentation.   Patient Care Team: Lindell Spar, MD as PCP - General (Internal Medicine) Fay Records, MD as PCP - Cardiology (Cardiology) Gala Romney Cristopher Estimable, MD as Consulting Physician (Gastroenterology) Beryle Lathe, [facility] (Pharmacist) Kassie Mends, RN as Case Manager  Objective:  Lab Results  Component Value Date   CREATININE 1.14 (H) 02/10/2022   CREATININE 1.14 (H) 12/14/2021   CREATININE 0.85 03/19/2021    Lab Results  Component Value Date   HGBA1C 8.5 (H) 12/14/2021   Last diabetic Eye exam:  Lab Results  Component Value Date/Time   HMDIABEYEEXA No Retinopathy 06/11/2021 02:22 PM    Last diabetic Foot exam: No results found for: HMDIABFOOTEX      Component Value Date/Time   CHOL 136 12/14/2021 1012   TRIG 189 (H) 12/14/2021 1012   HDL 36 (L) 12/14/2021 1012   CHOLHDL 3.8 12/14/2021 1012   CHOLHDL 5.2 (H) 01/21/2020 1009   Wells 68 12/14/2021  1012   LDLCALC 133 (H) 01/21/2020 1009    Hepatic Function Latest Ref Rng & Units 12/14/2021 03/19/2021 07/31/2020  Total Protein 6.0 - 8.5 g/dL  6.2 6.4 6.3  Albumin 3.8 - 4.9 g/dL 4.2 - -  AST 0 - 40 IU/L _0 ALT 0 - 32 IU/L _1 Alk Phosphatase 44 - 121 IU/L 83 - -  Total Bilirubin 0.0 - 1.2 mg/dL 0.4 0.4 0.6    Lab Results  Component Value Date/Time   TSH 2.220 12/14/2021 10:12 AM   TSH 1.67 10/11/2021 02:28 PM   FREET4 0.95 12/14/2021 10:12 AM   FREET4 0.82 10/11/2021 02:28 PM    CBC Latest Ref Rng & Units 02/10/2022 12/14/2021 09/01/2020  WBC 4.0 - 10.5 K/uL 8.9 9.9 9.7  Hemoglobin 12.0 - 15.0 g/dL 14.1 14.9 14.8  Hematocrit 36.0 - 46.0 % 42.8 45.1 44.2  Platelets 150 - 400 K/uL 304 357 279    Lab Results  Component Value Date/Time   VD25OH 32 01/21/2020 10:09 AM    Clinical ASCVD: No  The 10-year ASCVD risk score (Arnett DK, et al., 2019) is: 3.4%   Values used to calculate the score:     Age: 53 years     Sex: Female     Is Non-Hispanic African American: No     Diabetic: Yes     Tobacco smoker: No     Systolic Blood Pressure: 99 mmHg     Is BP treated: Yes     HDL Cholesterol: 36 mg/dL     Total Cholesterol: 136 mg/dL    Social History   Tobacco Use  Smoking Status Former   Types: Cigarettes   Quit date: 12/30/1982   Years since quitting: 39.1  Smokeless Tobacco Never   BP Readings from Last 3 Encounters:  02/10/22 99/61  02/01/22 122/88  01/16/22 138/74   Pulse Readings from Last 3 Encounters:  02/10/22 91  02/01/22 94  01/16/22 (!) 102   Wt Readings from Last 3 Encounters:  02/10/22 259 lb 0.7 oz (117.5 kg)  02/01/22 259 lb 0.6 oz (117.5 kg)  01/16/22 262 lb (118.8 kg)    Assessment: Review of patient past medical history, allergies, medications, health status, including review of consultants reports, laboratory and other test data, was performed as part of comprehensive evaluation and provision of chronic care management services.   SDOH:  (Social Determinants of Health) assessments and interventions performed:    CCM Care Plan  Allergies  Allergen Reactions    Cephalexin Itching, Swelling and Other (See Comments)    Medications Reviewed Today     Reviewed by Beryle Lathe, [facility] (Pharmacist) on 02/27/22 at 1417  Med List Status: <None>   Medication Order Taking? Sig Documenting Provider Last Dose Status Informant  ALPRAZolam (XANAX) 0.5 MG tablet [id number] Yes Take 0.5 mg by mouth daily as needed. [provider] Taking Active   blood glucose meter kit and supplies KIT [id number] Yes Inject 1 each into the skin daily. Check blood glucose once a day. Diagnosis code: E 11.9. Doree Albee, MD Taking Active   buPROPion ER [facility] SR) 100 MG 12 hr tablet [id number] Yes Take 100 mg by mouth daily. [provider] Taking Active   chlorthalidone (HYGROTON) 25 MG tablet [id number] Yes Take 0.5 tablets (12.5 mg total) by mouth daily. Lindell Spar, MD Taking Active   Cholecalciferol (VITAMIN D3) 125 MCG (5000 UT) TABS [id number] Yes Take 2 tablets (10,000  Units total) by mouth daily. Doree Albee, MD Taking Active   Continuous Blood Gluc Sensor ([place]) MISC [id number] Yes by Does not apply route. [provider] Taking Active   Continuous Blood Gluc Transmit ([place]) MISC [id number] Yes by Does not apply route. [provider] Taking Active   Cyanocobalamin (VITAMIN B12) 1000 MCG TBCR [id number] Yes Take 1,000 mcg by mouth daily. [provider] Taking Active   DULoxetine (CYMBALTA) 30 MG capsule [id number] Yes 1 CAPSULE IN THE AFTERNOON (CONTINUE 60 MG IN THE MORNING FOR TOTAL 90 MG DAILY) ORALLY ONCE A DAY [provider] Taking Active   DULoxetine (CYMBALTA) 60 MG capsule [id number] Yes Take 60 mg by mouth every morning. [provider] Taking Active Self           Med Note Waldo Laine, Gwenyth Allegra   Wed Feb 27, 2022  2:10 PM) Also takes 30 mg capsule every afternoon for a total of 37m daily  glipiZIDE (GLUCOTROL) 10 MG tablet [id number] TAKE 1 TABLET BY  MOUTH TWICE DAILY BEFORE A MEAL ERenato Shin MD Taking Active   glucose blood (ACCU-CHEK GUIDE) test strip [id number] 1 each by Other route in the morning, at noon, and at bedtime. Use as instructed GAilene Ards NP Taking Active   insulin glargine, 2 Unit Dial, (TOUJEO MAX SOLOSTAR) 300 UNIT/ML Solostar Pen [id number] Inject 106 Units into the skin every morning. And pen needles 1/day ERenato Shin MD Taking Active   Insulin Pen Needle (PEN NEEDLES) 32G X 4 MM MISC [id number] Check BS 2 x a day ERenato Shin MD Taking Active   JARDIANCE 10 MG TABS tablet [id number] TAKE 1 TABLET(10 MG) BY MOUTH DAILY BEFORE BREAKFAST PLindell Spar MD Taking Active   KLOR-CON M20 20 MEQ tablet [id number] TAKE 2 TABLETS BY MOUTH TWICE A DAY  Patient taking differently: Take 40 mEq by mouth daily.   PLindell Spar MD Taking Active Self  L-Methylfolate 15 MG TABS [id number] Take 15 mg by mouth daily at 6 (six) AM. [provider] Taking Active   losartan (COZAAR) 25 MG tablet [id number] TAKE 1 TABLET(25 MG) BY MOUTH DAILY PLindell Spar MD Taking Active   metFORMIN (GLUCOPHAGE-XR) 500 MG 24 hr tablet [id number] Take 2 tablets (1,000 mg total) by mouth daily with breakfast.  Patient taking differently: Take 1,000 mg by mouth daily with supper.   ERenato Shin MD Taking Active   metoprolol tartrate (LOPRESSOR) 50 MG tablet [id number] Take 1 tablet (50 mg total) by mouth 2 (two) times daily. No more refills will be approved by this provider as office is now permanently closed, patient will need to find another primary care provider. PLindell Spar MD Taking Active   Multiple Vitamins-Minerals (MULTIVITAMIN WOMEN 50+) TABS [id number] Take 1 tablet by mouth daily. [provider] Taking Active Self  Pantoprazole Sodium (PROTONIX PO) [id number] Take 40 mg by mouth daily. [provider] Taking Active   rosuvastatin (CRESTOR) 10 MG tablet [id number]  Take 1 tablet (10 mg total) by mouth daily.  Patient taking differently: Take 10 mg by mouth every other day.   PLindell Spar MD Taking Active Self  Saccharomyces boulardii (PROBIOTIC) 250 MG CAPS [id number] Take 1 capsule by mouth daily.  Patient not taking: Reported on 02/27/2022   [provider] Not Taking Active   SUMAtriptan (IMITREX) 50 MG tablet [id number] MAY  REPEAT IN 2 HOURS IF HEADACHE PERSISTS OR RECURS. MAXIMUM 2 PER DAY.  Patient not taking: Reported on 02/27/2022   Lindell Spar, MD Not Taking Active   traZODone (DESYREL) 50 MG tablet [id number] Yes Take 12.5 mg by mouth at bedtime as needed. [provider] Taking Active Self            Patient Active Problem List   Diagnosis Date Noted   Acute pancreatitis 02/12/2022   Migraine with aura 02/01/2022   Encounter for general adult medical examination with abnormal findings 02/01/2022   Plantar fasciitis 02/01/2022   Chest pain 01/14/2022   Chronic migraine without aura without status migrainosus, not intractable 10/29/2021   Diabetic polyneuropathy associated with type 2 diabetes mellitus ([place]) 09/19/2021   History of hormone therapy 09/19/2021   Bilateral primary osteoarthritis of knee 04/10/2021   Perennial allergic rhinitis 04/06/2021   Allergic contact dermatitis 04/06/2021   Cholinergic urticaria 04/06/2021   Fibromyalgia syndrome 01/12/2021   Elevated C-reactive protein (CRP) 01/12/2021   Diverticulosis of colon without hemorrhage 01/02/2021   Major depressive disorder, single episode, severe ([place]) 07/14/2020   GERD (gastroesophageal reflux disease) 05/11/2020   Type 2 diabetes mellitus ([place]) 03/27/2020   Bipolar depression ([place]) 12/28/2019   Sleep apnea 12/28/2019   Tachycardia 10/31/2019   Hyperlipidemia 10/30/2015   Morbid obesity ([place]) 10/30/2015    Immunization History  Administered Date(s) Administered   Influenza Whole 10/30/2021   Influenza,inj,Quad PF,6+ Mos 11/01/2019    Influenza-Unspecified 10/30/2020   Moderna Sars-Covid-2 Vaccination 05/13/2020, 06/23/2020, 07/12/2021   Pneumococcal Polysaccharide-23 12/18/2020   Td 12/30/2020   Zoster Recombinat (Shingrix) 12/30/2020, 05/29/2021    Conditions to be addressed/monitored: HLD and DMII  Care Plan : Medication Management  Updates made by Beryle Lathe, [place] since 02/27/2022 12:00 AM     Problem: Diabetes and Hyperlipidemia   Priority: High  Onset Date: 10/10/2021     Long-Range Goal: Disease Progression Prevention   Start Date: 10/10/2021  Expected End Date: 01/08/2022  Recent Progress: On track  Priority: High  Note:   Current Barriers:  Unable to achieve control of diabetes and hyperlipidemia  Pharmacist Clinical Goal(s):  Through collaboration with PharmD and provider, patient will  Achieve control of diabetes and hyperlipidemia as evidenced by improved fasting blood sugar, improved A1c, and improved triglycerides   Interventions: 1:1 collaboration with Lindell Spar, MD regarding development and update of comprehensive plan of care as evidenced by provider attestation and co-signature Inter-disciplinary care team collaboration (see longitudinal plan of care) Comprehensive medication review performed; medication list updated in electronic medical record  Type 2 Diabetes - Goal on Track (progressing): YES. (Followed by Dr. Loanne Drilling) Uncontrolled but improving; A1c 12.4>8.5 over last several months; Most recent A1c remains above goal of <7% per ADA guidelines. Suspect high levels of insulin resistance.  Current medications: Toujeo 106 units subcutaneously once daily (recently increased by Dr. Loanne Drilling), empagliflozin (Jardiance) 10 mg by mouth once daily, metformin 1,000 mg by mouth daily with supper, and glipizide IR 10 mg by mouth twice daily with meals Recent changes: Dr. Loanne Drilling switched patient to concentrated insulin as recommended Intolerances:  GI upset/drowsiness with  metformin more than 1,000 mg/day; nausea and vomiting with Trulicity; will avoid GLP-1 agonists due to recent pancreatitis Taking medications as directed: yes Side effects thought to be attributed to current medication regimen: no Denies hypoglycemic symptoms (sweaty and shaky). Current meal patterns:  patient is a vegetarian. Reports "compulsive eating" and struggles with eating foods high  in carbohydrates (potato chips and pasta); she does not drink soda, juice, or milk. Does not add sugar to her coffee. Eats 3 meals a day. Eats 1-2 pieces of bread per day. Current exercise:  walks some but is generally not very active due to fatigue On a statin: yes, every other day On aspirin 81 mg daily: no Last microalbumin/creatinine ratio: 5 (12/18/20); on an ACEi/ARB: yes Last eye exam: completed within last year Last foot exam: completed within last year Pneumonia vaccine: up to date Shingles: series complete Current glucose readings: See Dexcom report below for last 30 days. Continued improvement in blood glucose. Time in range 43%. Time above range 56%. No hypoglycemia.  Patient instructed to use Dexcom G6 to monitor blood glucose unless her sensor glucose reading does not match her symptoms. Discussed differences between blood glucose and interstitial glucose.   Continue above medications per endocrinology. One consideration for change might be to consider adding prandial insulin at this point. Patient is receiving 0.9 units/kg of basal insulin which is considered over basalization. Additionally, could also consider addition of pioglitazone to increase insulin sensitivity (consider risk/benefit). Will send message to endocrinology to consider above recommendations. Will defer to endocrinology.  May be reasonable to consider discontinuation of glipizide once A1c approaches goal to prevent hypoglycemia.      Hyperlipidemia - Goal on Track (progressing): YES.: Uncontrolled but improving; LDL at goal  of <70 due to very high risk given diabetes + at least 1 additional major risk factor (hypertension) per 2020 AACE/ACE guidelines. Triglycerides above goal of <150 per 2020 AACE/ACE guidelines. Current medications: rosuvastatin 10 mg by mouth every other day (patient reports primary care provider told her to decrease due to muscle aches) Intolerances:  muscle aches in legs with daily dosing of statin Taking medications as directed: yes Side effects thought to be attributed to current medication regimen: no Continue rosuvastatin 10 mg by mouth every other day Encourage dietary reduction of high fat containing foods such as butter, nuts, bacon, egg yolks, etc. Discussed need for and importance of continued work on weight loss Reviewed risks of hyperlipidemia, principles of treatment and consequences of untreated hyperlipidemia Discussed need for medication compliance Triglycerides will likely improve as blood glucose continues to improve. May consider Vascepa for cardiovascular risk reduction although patient does not currently meet inclusion criteria per REDUCE-IT trial. She has diabetes but no additional major risk factors at this time.   Patient Goals/Self-Care Activities Patient will:  Take medications as prescribed Check blood sugar continuously with continuous glucose monitor, document, and provide at future appointments Engage in dietary modifications by less frequent dining out and fewer sweetened foods & beverages  Follow Up Plan: Follow up with endocrinology: 03/12/22; Next PCP appointment scheduled for: 06/03/22     Medication Assistance: None required.  Patient affirms current coverage meets needs.  Patient's preferred pharmacy is:  [facility] [place] [address] [place] [address] Phone: [phone] Fax: [place], [place] [address] Phone:  [phone] Fax: [phone]  [facility] [phone] - [place], [place] [facility] [place] [address] [place] [address] Phone: [phone] Fax: [phone]  [id number]: [phone]: [phone] Follow Up:  Patient agrees to Care Plan  and Follow-up.  Plan: Follow up with endocrinology: 03/12/22; Next PCP appointment scheduled for: 06/03/22  Kennon Holter, PharmD, [place], CPP Clinical Pharmacist Practitioner [facility] Primary Care [phone]

## 2022-02-28 ENCOUNTER — Ambulatory Visit: Payer: Medicare Other | Admitting: Internal Medicine

## 2022-02-28 ENCOUNTER — Encounter: Payer: Self-pay | Admitting: Internal Medicine

## 2022-02-28 ENCOUNTER — Ambulatory Visit (INDEPENDENT_AMBULATORY_CARE_PROVIDER_SITE_OTHER): Payer: Medicare Other

## 2022-02-28 VITALS — BP 112/70 | HR 86 | Ht 69.0 in | Wt 264.8 lb

## 2022-02-28 DIAGNOSIS — R002 Palpitations: Secondary | ICD-10-CM

## 2022-02-28 MED ORDER — METOPROLOL TARTRATE 25 MG PO TABS: 25.0000 mg | ORAL_TABLET | Freq: Two times a day (BID) | ORAL | 3 refills | Status: DC

## 2022-02-28 NOTE — Patient Instructions (Addendum)
Medication Instructions:  ? ?Decrease Metoprolol Tartrate to 25 mg Two Times Daily  ? ?*If you need a refill on your cardiac medications before your next appointment, please call your pharmacy* ? ? ?Lab Work: ?NONE  ? ?If you have labs (blood work) drawn today and your tests are completely normal, you will receive your results only by: ?MyChart Message (if you have MyChart) OR ?A paper copy in the mail ?If you have any lab test that is abnormal or we need to change your treatment, we will call you to review the results. ? ? ?Testing/Procedures: ?ZIO XT- Long Term Monitor Instructions  ? ?Your physician has requested you wear your ZIO patch monitor___3___days.  ? ?Do not place until March 14, 2022.  ? ?This is a single patch monitor.  [employer] supplies one patch monitor per enrollment.  Additional stickers are not available. ?  ?Please do not apply patch if you will be having a Nuclear Stress Test, Echocardiogram, Cardiac CT, MRI, or Chest Xray during the time frame you would be wearing the monitor. The patch cannot be worn during these tests.  You cannot remove and re-apply the ZIO XT patch monitor. ?  ?Your ZIO patch monitor will be sent USPS Priority mail from [facility] directly to your home address. The monitor may also be mailed to a PO BOX if home delivery is not available.   It may take 3-5 days to receive your monitor after you have been enrolled. ?  ?Once you have received you monitor, please review enclosed instructions.  Your monitor has already been registered assigning a specific monitor serial # to you. ?  ?Applying the monitor  ? ?Shave hair from upper left chest. ?  ?Hold abrader disc by orange tab.  Rub abrader in 40 strokes over left upper chest as indicated in your monitor instructions. ?  ?Clean area with 4 enclosed alcohol pads .  Use all pads to assure are is cleaned thoroughly.  Let dry.  ? ?Apply patch as indicated in monitor instructions.  Patch will be place under collarbone on  left side of chest with arrow pointing upward. ?  ?Rub patch adhesive wings for 2 minutes.Remove white label marked "1".  Remove white label marked "2".  Rub patch adhesive wings for 2 additional minutes. ?  ?While looking in a mirror, press and release button in center of patch.  A small green light will flash 3-4 times .  This will be your only indicator the monitor has been turned on. ?    ?Do not shower for the first 24 hours.  You may shower after the first 24 hours. ?  ?Press button if you feel a symptom. You will hear a small click.  Record Date, Time and Symptom in the Patient Log Book. ?  ?When you are ready to remove patch, follow instructions on last 2 pages of Patient Log Book.  Stick patch monitor onto last page of Patient Log Book. ?  ?Place Patient Log Book in [facility] box.  Use locking tab on box and tape box closed securely.  The [employer] has [employer] on it.  Please place in mailbox as soon as possible.  Your physician should have your test results approximately 7 days after the monitor has been [facility]. ?  ?Call [facility] at [phone] if you have questions regarding your ZIO XT patch monitor.  Call them immediately if you see an orange light blinking on  your monitor. ?  ?If your monitor falls off in less than 4 days contact our Monitor department at [phone].  If your monitor becomes loose or falls off after 4 days call [employer] at [phone] for suggestions on securing your monitor.  ? ? ? ?Follow-Up: ?At [facility], you and your health needs are our priority.  As part of our continuing mission to provide you with exceptional heart care, we have created designated Provider Care Teams.  These Care Teams include your primary Cardiologist (physician) and Advanced Practice Providers (APPs -  Physician Assistants and Nurse Practitioners) who all work together to provide you with the care you need, when you need it. ? ?We recommend  signing up for the patient portal called "MyChart".  Sign up information is provided on this After Visit Summary.  MyChart is used to connect with patients for Virtual Visits (Telemedicine).  Patients are able to view lab/test results, encounter notes, upcoming appointments, etc.  Non-urgent messages can be sent to your provider as well.   ?To learn more about what you can do with MyChart, go to [email or web address].   ? ?Your next appointment:   ?1  year(s) ? ?The format for your next appointment:   ?In Person ? ?Provider:   ?Dorris Carnes, MD  ? ? ?Other Instructions ?Thank you for choosing [facility]! ?  ? ?

## 2022-03-01 ENCOUNTER — Other Ambulatory Visit: Payer: Self-pay | Admitting: *Deleted

## 2022-03-01 ENCOUNTER — Encounter: Payer: Self-pay | Admitting: Internal Medicine

## 2022-03-01 DIAGNOSIS — K8020 Calculus of gallbladder without cholecystitis without obstruction: Secondary | ICD-10-CM

## 2022-03-01 NOTE — Telephone Encounter (Signed)
Spoke with pt regarding result note please see for further instruction ?

## 2022-03-04 ENCOUNTER — Other Ambulatory Visit: Payer: Self-pay

## 2022-03-04 ENCOUNTER — Ambulatory Visit: Payer: Medicare Other | Admitting: General Surgery

## 2022-03-04 ENCOUNTER — Encounter: Payer: Self-pay | Admitting: General Surgery

## 2022-03-04 VITALS — BP 119/79 | HR 84 | Temp 98.6°F | Resp 14 | Ht 69.0 in | Wt 264.0 lb

## 2022-03-04 DIAGNOSIS — K851 Biliary acute pancreatitis without necrosis or infection: Secondary | ICD-10-CM | POA: Diagnosis not present

## 2022-03-04 NOTE — Patient Instructions (Signed)
Minimally Invasive Cholecystectomy Minimally invasive cholecystectomy is surgery to remove the gallbladder. The gallbladder is a pear-shaped organ that lies beneath the liver on the right side of the body. The gallbladder stores bile, which is a fluid that helps the body digest fats. Cholecystectomy is often done to treat inflammation (irritation and swelling) of the gallbladder (cholecystitis). This condition is usually caused by a buildup of gallstones (cholelithiasis) in the gallbladder or when the fluid in the gall bladder becomes stagnant because gallstones get stuck in the ducts (tubes) and block the flow of bile. This can result in inflammation and pain. In severe cases, emergency surgery may be required. This procedure is done through small incisions in the abdomen, instead of one large incision. It is also called laparoscopic surgery. A thin scope with a camera (laparoscope) is inserted through one incision. Then surgical instruments are inserted through the other incisions. In some cases, a minimally invasive surgery may need to be changed to a surgery that is done through a larger incision. This is called open surgery. Tell a health care provider about: Any allergies you have. All medicines you are taking, including vitamins, herbs, eye drops, creams, and over-the-counter medicines. Any problems you or family members have had with anesthetic medicines. Any bleeding problems you have. Any surgeries you have had. Any medical conditions you have. Whether you are pregnant or may be pregnant. What are the risks? Generally, this is a safe procedure. However, problems may occur, including: Infection. Bleeding. Allergic reactions to medicines. Damage to nearby structures or organs. A gallstone remaining in the common bile duct. The common bile duct carries bile from the gallbladder to the small intestine. A bile leak from the liver or cystic duct after your gallbladder is removed. What happens  before the procedure?  Medicines Ask your health care provider about: Changing or stopping your regular medicines. This is especially important if you are taking diabetes medicines or blood thinners. Taking medicines such as aspirin and ibuprofen. These medicines can thin your blood. Do not take these medicines unless your health care provider tells you to take them. Taking over-the-counter medicines, vitamins, herbs, and supplements. General instructions If you will be going home right after the procedure, plan to have a responsible adult: Take you home from the hospital or clinic. You will not be allowed to drive. Care for you for the time you are told. Do not use any products that contain nicotine or tobacco for at least 4 weeks before the procedure. These products include cigarettes, chewing tobacco, and vaping devices, such as e-cigarettes. If you need help quitting, ask your health care provider. Ask your health care provider: How your surgery site will be marked. What steps will be taken to help prevent infection. These may include: Removing hair at the surgery site. Washing skin with a germ-killing soap. Taking antibiotic medicine. What happens during the procedure?  An IV will be inserted into one of your veins. You will be given one or both of the following: A medicine to help you relax (sedative). A medicine to make you fall asleep (general anesthetic). Your surgeon will make several small incisions in your abdomen. The laparoscope will be inserted through one of the small incisions. The camera on the laparoscope will send images to a monitor in the operating room. This lets your surgeon see inside your abdomen. A gas will be pumped into your abdomen. This will expand your abdomen to give the surgeon more room to perform the surgery. Other tools  that are needed for the procedure will be inserted through the other incisions. The gallbladder will be removed through one of the  incisions. Your common bile duct may be examined. If stones are found in the common bile duct, they may be removed. After your gallbladder has been removed, the incisions will be closed with stitches (sutures), staples, or skin glue. Your incisions will be covered with a bandage (dressing). The procedure may vary among health care providers and hospitals. What happens after the procedure? Your blood pressure, heart rate, breathing rate, and blood oxygen level will be monitored until you leave the hospital or clinic. You will be given medicines as needed to control your pain. You may have a drain placed in the incision. The drain will be removed a day or two after the procedure. Summary Minimally invasive cholecystectomy, also called laparoscopic cholecystectomy, is surgery to remove the gallbladder using small incisions. Tell your health care provider about all the medical conditions you have and all the medicines you are taking for those conditions. Before the procedure, follow instructions about when to stop eating and drinking and changing or stopping medicines. Plan to have a responsible adult care for you for the time you are told after you leave the hospital or clinic. This information is not intended to replace advice given to you by your health care provider. Make sure you discuss any questions you have with your health care provider. Document Revised: 06/19/2021 Document Reviewed: 06/19/2021 Elsevier Patient Education  2022 [employer]. Cholelithiasis Cholelithiasis happens when gallstones form in the gallbladder. The gallbladder stores bile. Bile is a fluid that helps digest fats. Bile can harden and form into gallstones. If they cause a blockage, they can cause pain (gallbladder attack). What are the causes? This condition may be caused by: Some blood diseases, such as sickle cell anemia. Too much of a fat-like substance (cholesterol) in your bile. Not enough bile salts in your  bile. These salts help the body absorb and digest fats. The gallbladder not emptying fully or often enough. This is common in pregnant women. What increases the risk? The following factors may make you more likely to develop this condition: Being female. Being pregnant many times. Eating a lot of fried foods, fat, and refined carbs (refined carbohydrates). Being very overweight (obese). Being older than age 79. Using medicines with female hormones in them for a long time. Losing weight fast. Having gallstones in your family. Having some health problems, such as diabetes, Crohn's disease, or liver disease. What are the signs or symptoms? Often, there may be gallstones but no symptoms. These gallstones are called silent gallstones. If a gallstone causes a blockage, you may get sudden pain. The pain: Can be in the upper right part of your belly (abdomen). Normally comes at night or after you eat. Can last an hour or more. Can spread to your right shoulder, back, or chest. Can feel like discomfort, burning, or fullness in the upper part of your belly (indigestion). If the blockage lasts more than a few hours, you can get an infection or swelling. You may: Feel like you may vomit. Vomit. Feel bloated. Have belly pain for 5 hours or more. Feel tender in your belly, often in the upper right part and under your ribs. Have fever or chills. Have skin or the white parts of your eyes turn yellow (jaundice). Have dark pee (urine) or pale poop (stool). How is this treated? Treatment for this condition depends on how bad you feel.  If you have symptoms, you may need: Home care, if symptoms are not very bad. Do not eat for 12-24 hours. Drink only water and clear liquids. Start to eat simple or clear foods after 1 or 2 days. Try broths and crackers. You may need medicines for pain or stomach upset or both. If you have an infection, you will need antibiotics. A hospital stay, if you have very bad  pain or a very bad infection. Surgery to remove your gallbladder. You may need this if: Gallstones keep coming back. You have very bad symptoms. Medicines to break up gallstones. Medicines: Are best for small gallstones. May be used for up to 6-12 months. A procedure to find and take out gallstones or to break up gallstones. Follow these instructions at home: Medicines Take over-the-counter and prescription medicines only as told by your doctor. If you were prescribed an antibiotic medicine, take it as told by your doctor. Do not stop taking the antibiotic even if you start to feel better. Ask your doctor if the medicine prescribed to you requires you to avoid driving or using machinery. Eating and drinking Drink enough fluid to keep your urine pale yellow. Drink water or clear fluids. This is important when you have pain. Eat healthy foods. Choose: Fewer fatty foods, such as fried foods. Fewer refined carbs. Avoid breads and grains that are highly processed, such as white bread and white rice. Choose whole grains, such as whole-wheat bread and brown rice. More fiber. Almonds, fresh fruit, and beans are healthy sources. General instructions Keep a healthy weight. Keep all follow-up visits as told by your doctor. This is important. Where to find more information [employer] of Diabetes and Digestive and Kidney Diseases: [email or web address] Contact a doctor if: You have sudden pain in the upper right part of your belly. Pain might spread to your right shoulder, back, or chest. You have been diagnosed with gallstones that have no symptoms and you get: Belly pain. Discomfort, burning, or fullness in the upper part of your abdomen. You have dark urine or pale stools. Get help right away if: You have sudden pain in the upper right part of your abdomen, and the pain lasts more than 2 hours. You have pain in your abdomen, and: It lasts more than 5 hours. It keeps getting worse. You  have a fever or chills. You keep feeling like you may vomit. You keep vomiting. Your skin or the white parts of your eyes turn yellow. Summary Cholelithiasis happens when gallstones form in the gallbladder. This condition may be caused by a blood disease, too much of a fat-like substance in the bile, or not enough bile salts in bile. Treatment for this condition depends on how bad you feel. If you have symptoms, do not eat or drink. You may need medicines. You may need a hospital stay for very bad pain or a very bad infection. You may need surgery if gallstones keep coming back or if you have very bad symptoms. This information is not intended to replace advice given to you by your health care provider. Make sure you discuss any questions you have with your health care provider. Document Revised: 02/04/2020 Document Reviewed: 11/08/2019 Elsevier Patient Education  2022 [employer].

## 2022-03-04 NOTE — Progress Notes (Signed)
[facility] History and Physical  Reason for Referral: Gallstones, gallstone pancreatitis  Referring Physician: Lindell Spar, MD  Chief Complaint   New Patient (Initial Visit)     Sydney Taylor is a 56 y.o. female.  HPI: Sydney Taylor is a 55 yo with DM, fibromyaglia, GERD who was seen  in the ED a few weeks ago with an elevated lipase and concern for pancreatitis. Sydney Taylor had no changes in her medicine, normal TG and no other findings to indicate any reason for pancreatitis. Sydney Taylor does not drink alcohol. US demonstrates stones. Sydney Taylor had a CT at the Ed visit without other concerning features. Sydney Taylor says her pain was in the epigastric, uper abdomen area and radiated to her pain. Sydney Taylor has had similar episodes of pain in the past. Sydney Taylor says that Sydney Taylor had RUQ pain in the past too but did not think too much about it.  Sydney Taylor has had cardiac follow up with Dr. Harrington Challenger for the epigastric /chest pain and felt that this was atypical and that the patient had some tachycardia controlled on medicine.   Past Medical History:  Diagnosis Date   Anemia    Anxiety    Arrhythmia    Bipolar depression ([place]) 12/28/2019   Chronic fatigue    Chronic headaches    Depression    Diabetes mellitus without complication (HCC)    Fibromyalgia    GERD (gastroesophageal reflux disease)    High cholesterol    HLD (hyperlipidemia)    Hypertension    Insect bite    Migraine    Prediabetes    Sleep apnea    Urinary incontinence     Past Surgical History:  Procedure Laterality Date   ESOPHAGOGASTRODUODENOSCOPY N/A 05/12/2020   Procedure: ESOPHAGOGASTRODUODENOSCOPY (EGD);  Surgeon: Daneil Dolin, MD;  Location: [facility];  Service: Endoscopy;  Laterality: N/A;  11:15am   Joint fusion on foot Right    MALONEY DILATION N/A 05/12/2020   Procedure: MALONEY DILATION;  Surgeon: Daneil Dolin, MD;  Location: [facility];  Service: Endoscopy;  Laterality: N/A;   NASAL SEPTUM SURGERY     SINOSCOPY       Family History  Problem Relation Age of Onset   Hypertension Father    Hyperlipidemia Father    Diabetes Father    Stroke Father    Depression Father    Anxiety disorder Father    Hypertension Mother    Hyperlipidemia Mother    Diabetes Mellitus II Mother    Congestive Heart Failure Mother    Heart attack Mother    Hypertension Sister    Diabetes Sister    Hypertension Brother    Autoimmune disease Brother    Diabetes Maternal Grandmother    Lupus Paternal Grandmother    Depression Paternal Grandmother    Anxiety disorder Paternal Grandmother    Depression Maternal Aunt    Anxiety disorder Maternal Aunt    Colon cancer Neg Hx    Esophageal cancer Neg Hx    Gastric cancer Neg Hx     Social History   Tobacco Use   Smoking status: Former    Types: Cigarettes    Quit date: 12/30/1982    Years since quitting: 39.2   Smokeless tobacco: Never  Vaping Use   Vaping Use: Never used  Substance Use Topics   Alcohol use: Not Currently    Alcohol/week: 0.0 standard drinks   Drug use: Never    Medications: I have reviewed the patient's  current medications. Allergies as of 03/04/2022       Reactions   Cephalexin Itching, Swelling, Other (See Comments)        Medication List        Accurate as of March 04, 2022 11:02 AM. If you have any questions, ask your nurse or doctor.          Accu-Chek Guide test strip Generic drug: glucose blood 1 each by Other route in the morning, at noon, and at bedtime. Use as instructed   ALPRAZolam 0.5 MG tablet Commonly known as: XANAX Take 0.5 mg by mouth daily as needed.   blood glucose meter kit and supplies Kit Inject 1 each into the skin daily. Check blood glucose once a day. Diagnosis code: E 11.9.   buPROPion ER 100 MG 12 hr tablet Commonly known as: WELLBUTRIN SR Take 100 mg by mouth daily.   chlorthalidone 25 MG tablet Commonly known as: HYGROTON Take 0.5 tablets (12.5 mg total) by mouth daily.   Dexcom G6  Sensor Misc by Does not apply route.   Dexcom G6 Transmitter Misc by Does not apply route.   DULoxetine 30 MG capsule Commonly known as: CYMBALTA 1 CAPSULE IN THE AFTERNOON (CONTINUE 60 MG IN THE MORNING FOR TOTAL 90 MG DAILY) ORALLY ONCE A DAY   DULoxetine 60 MG capsule Commonly known as: CYMBALTA Take 60 mg by mouth every morning.   glipiZIDE 10 MG tablet Commonly known as: GLUCOTROL TAKE 1 TABLET BY MOUTH TWICE DAILY BEFORE A MEAL   Jardiance 10 MG Tabs tablet Generic drug: empagliflozin TAKE 1 TABLET(10 MG) BY MOUTH DAILY BEFORE BREAKFAST   Klor-Con M20 20 MEQ tablet Generic drug: potassium chloride SA TAKE 2 TABLETS BY MOUTH TWICE A DAY What changed:  how much to take when to take this   L-Methylfolate 15 MG Tabs Take 15 mg by mouth daily at 6 (six) AM.   losartan 25 MG tablet Commonly known as: COZAAR TAKE 1 TABLET(25 MG) BY MOUTH DAILY   metFORMIN 500 MG 24 hr tablet Commonly known as: GLUCOPHAGE-XR Take 2 tablets (1,000 mg total) by mouth daily with breakfast. What changed: when to take this   metoprolol tartrate 25 MG tablet Commonly known as: LOPRESSOR Take 1 tablet (25 mg total) by mouth 2 (two) times daily.   Multivitamin Women 50+ Tabs Take 1 tablet by mouth daily.   Pen Needles 32G X 4 MM Misc Check BS 2 x a day   Probiotic 250 MG Caps Take 1 capsule by mouth daily.   PROTONIX PO Take 40 mg by mouth daily.   rosuvastatin 10 MG tablet Commonly known as: CRESTOR Take 1 tablet (10 mg total) by mouth daily. What changed: when to take this   SUMAtriptan 50 MG tablet Commonly known as: IMITREX MAY REPEAT IN 2 HOURS IF HEADACHE PERSISTS OR RECURS. MAXIMUM 2 PER DAY.   Toujeo Max SoloStar 300 UNIT/ML Solostar Pen Generic drug: insulin glargine (2 Unit Dial) Inject 106 Units into the skin every morning. And pen needles 1/day   traZODone 50 MG tablet Commonly known as: DESYREL Take 12.5 mg by mouth at bedtime as needed.   Vitamin B12 1000  MCG Tbcr Take 1,000 mcg by mouth daily.   Vitamin D3 125 MCG (5000 UT) Tabs Take 2 tablets (10,000 Units total) by mouth daily.         ROS:  A comprehensive review of systems was negative except for: Cardiovascular: positive for chest pain Gastrointestinal: positive for  abdominal pain, nausea, and reflux symptoms Musculoskeletal: positive for back pain, neck pain, and joint pain, fibromyaglia Neurological: positive for numbness Endocrine: positive for temperature intolerance and diabetes  Blood pressure 119/79, pulse 84, temperature 98.6 F (37 C), temperature source Oral, resp. rate 14, height _0  (1.753 m), weight 264 lb (119.7 kg), SpO2 95 %. Physical Exam Vitals reviewed.  Constitutional:      Appearance: Sydney Taylor is obese.  HENT:     Head: Normocephalic.     Nose: Nose normal.     Mouth/Throat:     Mouth: Mucous membranes are moist.  Eyes:     Extraocular Movements: Extraocular movements intact.  Cardiovascular:     Rate and Rhythm: Normal rate and regular rhythm.  Pulmonary:     Effort: Pulmonary effort is normal.     Breath sounds: Normal breath sounds.  Abdominal:     General: There is no distension.     Palpations: Abdomen is soft.     Tenderness: There is abdominal tenderness in the right upper quadrant and epigastric area.  Musculoskeletal:        General: Normal range of motion.  Skin:    General: Skin is warm.  Neurological:     General: No focal deficit present.     Mental Status: Sydney Taylor is alert.  Psychiatric:        Mood and Affect: Mood normal.        Behavior: Behavior normal.    Results: CLINICAL DATA:  Initial evaluation for recent acute pancreatitis.   EXAM: ULTRASOUND ABDOMEN LIMITED RIGHT UPPER QUADRANT   COMPARISON:  Prior CT from 02/23/2021.   FINDINGS: Gallbladder:   Multiple echogenic calculi seen within the gallbladder lumen, largest of which measures 7 mm. Gallbladder wall measures at the upper limits of normal at 3 mm. No free  pericholecystic fluid. No sonographic Murphy sign elicited on exam.   Common bile duct:   Diameter: 6.1 mm   Liver:   No focal lesion identified. Liver demonstrates a dense echogenic echotexture, suggesting steatosis. Portal vein is patent on color Doppler imaging with normal direction of blood flow towards the liver.   Other: None.   IMPRESSION: 1. Cholelithiasis, with no sonographic features to suggest acute cholecystitis. 2. No biliary dilatation. 3. Hepatic steatosis.     Electronically Signed   By: Jeannine Boga M.D.   On: 02/28/2022 16:36   CLINICAL DATA:  Pt c/o nausea and increased acid reflux over past several weeks. Diverticulitis suspected.   EXAM: CT ABDOMEN AND PELVIS WITH CONTRAST   TECHNIQUE: Multidetector CT imaging of the abdomen and pelvis was performed using the standard protocol following bolus administration of intravenous contrast.   CONTRAST:  149m OMNIPAQUE IOHEXOL 300 MG/ML  SOLN   COMPARISON:  None.   FINDINGS: Lower chest: Atelectasis.  No acute abnormality.   Hepatobiliary: The liver is enlarged measuring up to 25 cm. The hepatic parenchyma is diffusely hypodense compared to the splenic parenchyma consistent with fatty infiltration. No focal liver abnormality. No gallstones, gallbladder wall thickening, or pericholecystic fluid. No biliary dilatation.   Pancreas: No focal lesion. Normal pancreatic contour. No surrounding inflammatory changes. No main pancreatic ductal dilatation.   Spleen: There is a 2.1 cm hypodensity within the splenic parenchyma. Otherwise the normal in size without other focal abnormality.   Adrenals/Urinary Tract: No adrenal nodule bilaterally. Bilateral kidneys enhance symmetrically. No hydronephrosis. No hydroureter. The urinary bladder is unremarkable. On delayed imaging, there is no urothelial wall thickening and there  are no filling defects in the opacified portions of the bilateral collecting  systems or ureters.   Stomach/Bowel: PO contrast reaches the descending colon. Stomach is within normal limits. No evidence of bowel wall thickening or dilatation. Appendix appears normal.   Vascular/Lymphatic: No abdominal aorta or iliac aneurysm. Mild atherosclerotic plaque of the aorta and its branches. No abdominal, pelvic, or inguinal lymphadenopathy.   Reproductive: Suggestion of a 2.4 cm lesion that is isodense to the uterine wall arising from the anterior uterine wall that likely representing a subserosal or intramural fibroid. Uterus and bilateral adnexa are unremarkable.   Other: No intraperitoneal free fluid. No intraperitoneal free gas. No organized fluid collection.   Musculoskeletal:   No abdominal wall hernia or abnormality.   No suspicious lytic or blastic osseous lesions. No acute displaced fracture. Multilevel degenerative changes of the spine.   IMPRESSION: 1. Likely uterine fibroid. Consider pelvic ultrasound if clinically indicated. 2. Hepatomegaly and hepatic steatosis. 3. Indeterminate 2.1 cm splenic hypodensity-likely benign entity Correlation with prior cross-sectional imaging would be of value. 4. Otherwise no acute intra abdominal or intrapelvic abnormality.     Electronically Signed   By: Iven Finn M.D.   On: 02/24/2021 19:46   Latest Reference Range & Units 12/14/21 10:12  Total CHOL/HDL Ratio 0.0 - 4.4 ratio 3.8  Cholesterol, Total 100 - 199 mg/dL 136  HDL Cholesterol >39 mg/dL 36 (L)  Triglycerides 0 - 149 mg/dL 189 (H)  VLDL Cholesterol Cal 5 - 40 mg/dL 32  LDL Chol Calc (NIH) 0 - 99 mg/dL 68    Assessment & Plan:  SHAQUALA BROEKER is a 56 y.o. female with gallstones and concern for gallstone pancreatitis given no other etiology noted.  -PLAN: I counseled the patient about the indication, risks and benefits of laparoscopic cholecystectomy.  Sydney Taylor understands there is a very small chance for bleeding, infection, injury to normal  structures (including common bile duct), conversion to open surgery, persistent symptoms, evolution of postcholecystectomy diarrhea, need for secondary interventions, anesthesia reaction, cardiopulmonary issues and other risks not specifically detailed here. I described the expected recovery, the plan for follow-up and the restrictions during the recovery phase.  All questions were answered.  Will also plan for an intraoperative cholangiogram given the pancreatitis and potential for stones in the duct. Discussed risk of allergic reaction.   All questions were answered to the satisfaction of the patient.     Virl Cagey 03/04/2022, 11:02 AM

## 2022-03-04 NOTE — Progress Notes (Signed)
Primary Care Physician: Lindell Spar, MD  Primary Gastroenterologist:  Garfield Cornea, MD   Chief Complaint  Patient presents with   Follow-up    ER follow up for abdominal pain, pancreatitis    HPI: Sydney Taylor is a 56 y.o. female here for ER follow up for abdominal pain. Patient last seen in 02/2021 for dysphagia and GERD. Recent ED evaluation, concerning for acute pancreatitis with elevated lipase in the 200 range. Has since had ruq u/s showing cholelithiasis. Saw general surgery yesterday and is scheduled for lap chole with IOC 03/15/22.   Patient states over the last 6 months or so she has had increased symptoms of gas/bloating, indigestion. Tried Gas X with very little relief.  More recently she has had few episodes of epigastric pain with radiation into the back.  Bad episode before ED visit on February 12.  She had another significant episode last week.  Doesn't matter what eats. Gas all the time. Larger amounts of food, increases indigestion. Feels like big gas bubble, in abdomen. BM regular. Occasional diarrhea. No melena, brbpr.  No heartburn.  Patient's weight is up about 10 pounds since December.  Patient reports having a colonoscopy in 2017, by Dr. Posey Pronto.  Advised to come back in 10 years.  She has a history of abnormal CT abdomen pelvis February 2022, uterine fibroid, hepatomegaly, hepatic steatosis, indeterminate 2.1 cm splenic hypodensity likely benign consider correlate with other CT studies.  She had a CT abdomen pelvis with IV contrast only in 2018 showing hepatic steatosis, small hypodense lesion in the superior aspect of the spleen may represent a small cyst or lymphangioma.  TCS 2017, colonoscopy Dr. Posey Pronto, come back 10 years.  RUQ U/S 02/27/2022: cholelithiasis, hepatic steatosis.  EGD 04/2020: erosive esophagitis with soft peptic stricture, s/p dilation for dysphagia.   Current Outpatient Medications  Medication Sig Dispense Refill   ALPRAZolam (XANAX)  0.5 MG tablet Take 0.5 mg by mouth daily as needed.     blood glucose meter kit and supplies KIT Inject 1 each into the skin daily. Check blood glucose once a day. Diagnosis code: E 11.9. 1 each 0   buPROPion ER (WELLBUTRIN SR) 100 MG 12 hr tablet Take 100 mg by mouth daily.     chlorthalidone (HYGROTON) 25 MG tablet Take 0.5 tablets (12.5 mg total) by mouth daily. 45 tablet 1   Cholecalciferol (VITAMIN D3) 125 MCG (5000 UT) TABS Take 2 tablets (10,000 Units total) by mouth daily. 180 tablet 1   Continuous Blood Gluc Sensor (DEXCOM G6 SENSOR) MISC by Does not apply route.     Continuous Blood Gluc Transmit (DEXCOM G6 TRANSMITTER) MISC by Does not apply route.     Cyanocobalamin (VITAMIN B12) 1000 MCG TBCR Take 1,000 mcg by mouth daily.     DULoxetine (CYMBALTA) 30 MG capsule 1 CAPSULE IN THE AFTERNOON (CONTINUE 60 MG IN THE MORNING FOR TOTAL 90 MG DAILY) ORALLY ONCE A DAY     DULoxetine (CYMBALTA) 60 MG capsule Take 60 mg by mouth every morning.     glipiZIDE (GLUCOTROL) 10 MG tablet TAKE 1 TABLET BY MOUTH TWICE DAILY BEFORE A MEAL 180 tablet 0   glucose blood (ACCU-CHEK GUIDE) test strip 1 each by Other route in the morning, at noon, and at bedtime. Use as instructed 300 each 3   insulin glargine, 2 Unit Dial, (TOUJEO MAX SOLOSTAR) 300 UNIT/ML Solostar Pen Inject 106 Units into the skin every morning. And pen needles 1/day  36 mL 3   Insulin Pen Needle (PEN NEEDLES) 32G X 4 MM MISC Check BS 2 x a day 100 each 5   JARDIANCE 10 MG TABS tablet TAKE 1 TABLET(10 MG) BY MOUTH DAILY BEFORE BREAKFAST 30 tablet 2   KLOR-CON M20 20 MEQ tablet TAKE 2 TABLETS BY MOUTH TWICE A DAY (Patient taking differently: Take 40 mEq by mouth daily.) 360 tablet 1   L-Methylfolate 15 MG TABS Take 15 mg by mouth daily at 6 (six) AM.     losartan (COZAAR) 25 MG tablet TAKE 1 TABLET(25 MG) BY MOUTH DAILY 90 tablet 0   metFORMIN (GLUCOPHAGE-XR) 500 MG 24 hr tablet Take 2 tablets (1,000 mg total) by mouth daily with breakfast.  (Patient taking differently: Take 1,000 mg by mouth daily with supper.) 180 tablet 3   metoprolol tartrate (LOPRESSOR) 25 MG tablet Take 1 tablet (25 mg total) by mouth 2 (two) times daily. 180 tablet 3   Multiple Vitamins-Minerals (MULTIVITAMIN WOMEN 50+) TABS Take 1 tablet by mouth daily.     Pantoprazole Sodium (PROTONIX PO) Take 40 mg by mouth daily.     rosuvastatin (CRESTOR) 10 MG tablet Take 1 tablet (10 mg total) by mouth daily. (Patient taking differently: Take 10 mg by mouth every other day.) 30 tablet 11   SUMAtriptan (IMITREX) 50 MG tablet MAY REPEAT IN 2 HOURS IF HEADACHE PERSISTS OR RECURS. MAXIMUM 2 PER DAY. 10 tablet 0   traZODone (DESYREL) 50 MG tablet Take 12.5 mg by mouth at bedtime as needed.     Saccharomyces boulardii (PROBIOTIC) 250 MG CAPS Take 1 capsule by mouth daily. (Patient not taking: Reported on 03/05/2022)     No current facility-administered medications for this visit.    Allergies as of 03/05/2022 - Review Complete 03/05/2022  Allergen Reaction Noted   Cephalexin Itching, Swelling, and Other (See Comments) 05/02/2020    ROS:  General: Negative for anorexia, weight loss, fever, chills, fatigue, weakness. ENT: Negative for hoarseness, difficulty swallowing , nasal congestion. CV: Negative for chest pain, angina, palpitations, dyspnea on exertion, peripheral edema.  Respiratory: Negative for dyspnea at rest, dyspnea on exertion, cough, sputum, wheezing.  GI: See history of present illness. GU:  Negative for dysuria, hematuria, urinary incontinence, urinary frequency, nocturnal urination.  Endo: Negative for unusual weight change.    Physical Examination:   BP 120/70 (BP Location: Right Arm, Patient Position: Sitting, Cuff Size: Large)    Pulse 83    Temp (!) 97.3 F (36.3 C) (Temporal)    Ht 5' 9"  (1.753 m)    Wt 263 lb (119.3 kg)    LMP  (LMP Unknown)    SpO2 96%    BMI 38.84 kg/m   General: Well-nourished, well-developed in no acute distress.  Eyes: No  icterus. Mouth: masked. Lungs: Clear to auscultation bilaterally.  Heart: Regular rate and rhythm, no murmurs rubs or gallops.  Abdomen: Bowel sounds are normal,  nondistended, no hepatosplenomegaly or masses, no abdominal bruits or hernia , no rebound or guarding.  Obese.  Dexcom in the right upper quadrant limited exam.  Mild epigastric tenderness. Extremities: No lower extremity edema. No clubbing or deformities. Neuro: Alert and oriented x 4   Skin: Warm and dry, no jaundice.   Psych: Alert and cooperative, normal mood and affect.  Labs:  Lab Results  Component Value Date   CREATININE 1.14 (H) 02/10/2022   BUN 19 02/10/2022   NA 136 02/10/2022   K 3.4 (L) 02/10/2022   CL  100 02/10/2022   CO2 27 02/10/2022   Lab Results  Component Value Date   WBC 8.9 02/10/2022   HGB 14.1 02/10/2022   HCT 42.8 02/10/2022   MCV 92.2 02/10/2022   PLT 304 02/10/2022   Lab Results  Component Value Date   ALT 19 12/14/2021   AST 18 12/14/2021   ALKPHOS 83 12/14/2021   BILITOT 0.4 12/14/2021   Lab Results  Component Value Date   LIPASE 254 (H) 02/10/2022   Lab Results  Component Value Date   HGBA1C 8.5 (H) 12/14/2021   Lab Results  Component Value Date   LIPASE 254 (H) 02/10/2022   Lab Results  Component Value Date   CHOL 136 12/14/2021   HDL 36 (L) 12/14/2021   LDLCALC 68 12/14/2021   TRIG 189 (H) 12/14/2021   CHOLHDL 3.8 12/14/2021     Imaging Studies: DG Chest Port 1 View  Result Date: 02/10/2022 CLINICAL DATA:  Chest pain EXAM: PORTABLE CHEST 1 VIEW COMPARISON:  09/01/2020 FINDINGS: Heart and mediastinal contours are within normal limits. No focal opacities or effusions. No acute bony abnormality. IMPRESSION: No active cardiopulmonary disease. Electronically Signed   By: Rolm Baptise M.D.   On: 02/10/2022 20:16   MM CLIP PLACEMENT LEFT  Result Date: 02/13/2022 CLINICAL DATA:  Post biopsy mammogram of the left breast for clip placement. EXAM: 3D DIAGNOSTIC LEFT  MAMMOGRAM POST STEREOTACTIC BIOPSY COMPARISON:  Previous exam(s). FINDINGS: 3D Mammographic images were obtained following stereotactic guided biopsy of possible distortion in the upper-outer left breast. The biopsy marking clip is in expected position at the site of biopsy. IMPRESSION: Appropriate positioning of the coil shaped biopsy marking clip at the site of biopsy in the upper-outer left breast. Final Assessment: Post Procedure Mammograms for Marker Placement Electronically Signed   By: Ammie Ferrier M.D.   On: 02/13/2022 12:12  MM LT BREAST BX W LOC DEV 1ST LESION IMAGE BX SPEC STEREO GUIDE  Addendum Date: 02/14/2022   ADDENDUM REPORT: 02/14/2022 14:33 ADDENDUM: Pathology revealed FIBROCYSTIC CHANGE WITH ADENOSIS AND CALCIFICATIONS- NEGATIVE FOR CARCINOMA of the LEFT breast, upper inner (coil clip). This was found to be concordant by Dr. Ammie Ferrier. Pathology results were discussed with the patient by telephone. The patient reported doing well after the biopsy with tenderness at the site. Post biopsy instructions and care were reviewed and questions were answered. The patient was encouraged to call [facility] for any additional concerns. The patient was instructed to return for LEFT diagnostic mammography and possible ultrasound in 6 months and informed a reminder notice would be sent regarding this appointment. Pathology results reported by Stacie Acres RN on 02/14/2022. Electronically Signed   By: Ammie Ferrier M.D.   On: 02/14/2022 14:33   Result Date: 02/14/2022 CLINICAL DATA:  Stereotactic biopsy of possible left breast distortion. EXAM: LEFT BREAST STEREOTACTIC CORE NEEDLE BIOPSY COMPARISON:  Previous exams. FINDINGS: The patient and I discussed the procedure of stereotactic-guided biopsy including benefits and alternatives. We discussed the high likelihood of a successful procedure. We discussed the risks of the procedure including infection, bleeding,  tissue injury, clip migration, and inadequate sampling. Informed written consent was given. The usual time out protocol was performed immediately prior to the procedure. Using sterile technique and 1% Lidocaine as local anesthetic, under stereotactic guidance, a 9 gauge vacuum assisted device was used to perform core needle biopsy of possible distortion in the upper-outer left breast using a superior approach. On the images today, the  area of concern could be visualized, but true distortion was not detected. Lesion quadrant: Upper outer quadrant At the conclusion of the procedure, a coil shaped tissue marker clip was deployed into the biopsy cavity. Follow-up 2-view mammogram was performed and dictated separately. IMPRESSION: Stereotactic-guided biopsy of possible distortion in the upper-outer left breast. No apparent complications. Electronically Signed: By: Ammie Ferrier M.D. On: 02/13/2022 12:13  US Abdomen Limited RUQ (LIVER/GB)  Result Date: 02/28/2022 CLINICAL DATA:  Initial evaluation for recent acute pancreatitis. EXAM: ULTRASOUND ABDOMEN LIMITED RIGHT UPPER QUADRANT COMPARISON:  Prior CT from 02/23/2021. FINDINGS: Gallbladder: Multiple echogenic calculi seen within the gallbladder lumen, largest of which measures 7 mm. Gallbladder wall measures at the upper limits of normal at 3 mm. No free pericholecystic fluid. No sonographic Murphy sign elicited on exam. Common bile duct: Diameter: 6.1 mm Liver: No focal lesion identified. Liver demonstrates a dense echogenic echotexture, suggesting steatosis. Portal vein is patent on color Doppler imaging with normal direction of blood flow towards the liver. Other: None. IMPRESSION: 1. Cholelithiasis, with no sonographic features to suggest acute cholecystitis. 2. No biliary dilatation. 3. Hepatic steatosis. Electronically Signed   By: Jeannine Boga M.D.   On: 02/28/2022 16:36     Assessment:  Abdominal pain: Recent episode of suspected gallstone  pancreatitis, cholelithiasis seen on ultrasound, no biliary dilatation.  Lipase was in the 200 range.  Symptoms consistent with biliary pancreatitis.  Unfortunately LFTs were not obtained.  Continues to have episodes of abdominal pain, 2 significant bouts in the past 3 weeks.  Complains of vague abdominal discomfort off and on for several months, more of gas-like pain/bloating/indigestion.  It is not clear how much of the symptoms are biliary in nature.  May be more digestive versus functional.  Hepatomegaly/hepatic steatosis: LFTs normal several months back.  We will update LFTs in the near future if not during preop evaluation.  We will reach out to Dr. Constance Haw, consider liver biopsy at time of cholecystectomy if liver grossly abnormal on exam.  Splenic lesion: 2.1 cm splenic hypodensities seen on CT in February 2022.  She has smaller similar lesions seen on CT in 2018.  Reviewed with radiologist.  Suspect benign findings.  Plan:  LFTs if not done during preop, orders provided. Low fat diet. Avoid high FODMAP foods. Take probiotic consistently for 2 months. Consider liver biopsy at time of cholecystectomy with liver grossly abnormal on exam. We will review CT findings from 2022 in 2018 regarding splenic lesion.

## 2022-03-05 ENCOUNTER — Encounter: Payer: Self-pay | Admitting: Internal Medicine

## 2022-03-05 ENCOUNTER — Ambulatory Visit: Payer: Medicare Other | Admitting: Gastroenterology

## 2022-03-05 ENCOUNTER — Telehealth: Payer: Self-pay | Admitting: Gastroenterology

## 2022-03-05 ENCOUNTER — Encounter: Payer: Self-pay | Admitting: Gastroenterology

## 2022-03-05 VITALS — BP 120/70 | HR 83 | Temp 97.3°F | Ht 69.0 in | Wt 263.0 lb

## 2022-03-05 DIAGNOSIS — K30 Functional dyspepsia: Secondary | ICD-10-CM

## 2022-03-05 DIAGNOSIS — K760 Fatty (change of) liver, not elsewhere classified: Secondary | ICD-10-CM | POA: Insufficient documentation

## 2022-03-05 DIAGNOSIS — K851 Biliary acute pancreatitis without necrosis or infection: Secondary | ICD-10-CM | POA: Diagnosis not present

## 2022-03-05 DIAGNOSIS — R140 Abdominal distension (gaseous): Secondary | ICD-10-CM | POA: Diagnosis not present

## 2022-03-05 NOTE — Patient Instructions (Addendum)
Please have your labs done if you do not have pre-op labs. I will follow up on labs and surgery findings.  Please stick to low fat diet to cause less stimulation of your gallbladder. It is also good for fatty liver, diabetes, and heart disease.  Limit any foods on the "avoid" list as these will increase gas production and increase bloating. Try a probiotic for 2 months, Align of [employer] are good options.  Return to the office in one year for fatty liver.  For persisting abdominal pain, severe abdominal pain, or fever go to the ER. You can reach on call provider by calling Forestine Na after hours at [phone].  Low-FODMAP Eating Plan FODMAP stands for fermentable oligosaccharides, disaccharides, monosaccharides, and polyols. These are sugars that are hard for some people to digest. A low-FODMAP eating plan may help some people who have irritable bowel syndrome (IBS) and certain other bowel (intestinal) diseases to manage their symptoms. This meal plan can be complicated to follow. Work with a diet and nutrition specialist (dietitian) to make a low-FODMAP eating plan that is right for you. A dietitian can help make sure that you get enough nutrition from this diet. What are tips for following this plan? Reading food labels Check labels for hidden FODMAPs such as: High-fructose syrup. Honey. Agave. Natural fruit flavors. Onion or garlic powder. Choose low-FODMAP foods that contain 3-4 grams of fiber per serving. Check food labels for serving sizes. Eat only one serving at a time to make sure FODMAP levels stay low. Shopping Shop with a list of foods that are recommended on this diet and make a meal plan. Meal planning Follow a low-FODMAP eating plan for up to 6 weeks, or as told by your health care provider or dietitian. To follow the eating plan: Eliminate high-FODMAP foods from your diet completely. Choose only low-FODMAP foods to eat. You will do this for 2-6  weeks. Gradually reintroduce high-FODMAP foods into your diet one at a time. Most people should wait a few days before introducing the next new high-FODMAP food into their meal plan. Your dietitian can recommend how quickly you may reintroduce foods. Keep a daily record of what and how much you eat and drink. Make note of any symptoms that you have after eating. Review your daily record with a dietitian regularly to identify which foods you can eat and which foods you should avoid. General tips Drink enough fluid each day to keep your urine pale yellow. Avoid processed foods. These often have added sugar and may be high in FODMAPs. Avoid most dairy products, whole grains, and sweeteners. Work with a dietitian to make sure you get enough fiber in your diet. Avoid high FODMAP foods at meals to manage symptoms. Recommended foods Fruits Bananas, oranges, tangerines, lemons, limes, blueberries, raspberries, strawberries, grapes, cantaloupe, honeydew melon, kiwi, papaya, passion fruit, and pineapple. Limited amounts of dried cranberries, banana chips, and shredded coconut. Vegetables Eggplant, zucchini, cucumber, peppers, green beans, bean sprouts, lettuce, arugula, kale, Swiss chard, spinach, collard greens, bok choy, summer squash, potato, and tomato. Limited amounts of corn, carrot, and sweet potato. Green parts of scallions. Grains Gluten-free grains, such as rice, oats, buckwheat, quinoa, corn, polenta, and millet. Gluten-free pasta, bread, or cereal. Rice noodles. Corn tortillas. Meats and other proteins Unseasoned beef, pork, poultry, or fish. Eggs. Berniece Salines. Tofu (firm) and tempeh. Limited amounts of nuts and seeds, such as almonds, walnuts, [place] nuts, pecans, peanuts, nut butters, pumpkin seeds, chia seeds, and sunflower seeds. Dairy  Lactose-free milk, yogurt, and kefir. Lactose-free cottage cheese and ice cream. Non-dairy milks, such as almond, coconut, hemp, and rice milk. Non-dairy yogurt.  Limited amounts of goat cheese, brie, mozzarella, parmesan, swiss, and other hard cheeses. Fats and oils Butter-free spreads. Vegetable oils, such as olive, canola, and sunflower oil. Seasoning and other foods Artificial sweeteners with names that do not end in "ol," such as aspartame, saccharine, and stevia. Maple syrup, white table sugar, raw sugar, brown sugar, and molasses. Mayonnaise, soy sauce, and tamari. Fresh basil, coriander, parsley, rosemary, and thyme. Beverages Water and mineral water. Sugar-sweetened soft drinks. Small amounts of orange juice or cranberry juice. Black and green tea. Most dry wines. Coffee. The items listed above may not be a complete list of foods and beverages you can eat. Contact a dietitian for more information. Foods to avoid Fruits Fresh, dried, and juiced forms of apple, pear, watermelon, peach, plum, cherries, apricots, blackberries, boysenberries, figs, nectarines, and mango. Avocado. Vegetables Chicory root, artichoke, asparagus, cabbage, snow peas, Brussels sprouts, broccoli, sugar snap peas, mushrooms, celery, and cauliflower. Onions, garlic, leeks, and the white part of scallions. Grains Wheat, including kamut, durum, and semolina. Barley and bulgur. Couscous. Wheat-based cereals. Wheat noodles, bread, crackers, and pastries. Meats and other proteins Fried or fatty meat. Sausage. Cashews and pistachios. Soybeans, baked beans, black beans, chickpeas, kidney beans, fava beans, navy beans, lentils, black-eyed peas, and split peas. Dairy Milk, yogurt, ice cream, and soft cheese. Cream and sour cream. Milk-based sauces. Custard. Buttermilk. Soy milk. Seasoning and other foods Any sugar-free gum or candy. Foods that contain artificial sweeteners such as sorbitol, mannitol, isomalt, or xylitol. Foods that contain honey, high-fructose corn syrup, or agave. Bouillon, vegetable stock, beef stock, and chicken stock. Garlic and onion powder. Condiments made with  onion, such as hummus, chutney, pickles, relish, salad dressing, and salsa. Tomato paste. Beverages Chicory-based drinks. Coffee substitutes. Chamomile tea. Fennel tea. Sweet or fortified wines such as port or sherry. Diet soft drinks made with isomalt, mannitol, maltitol, sorbitol, or xylitol. Apple, pear, and mango juice. Juices with high-fructose corn syrup. The items listed above may not be a complete list of foods and beverages you should avoid. Contact a dietitian for more information. Summary FODMAP stands for fermentable oligosaccharides, disaccharides, monosaccharides, and polyols. These are sugars that are hard for some people to digest. A low-FODMAP eating plan is a short-term diet that helps to ease symptoms of certain bowel diseases. The eating plan usually lasts up to 6 weeks. After that, high-FODMAP foods are reintroduced gradually and one at a time. This can help you find out which foods may be causing symptoms. A low-FODMAP eating plan can be complicated. It is best to work with a dietitian who has experience with this type of plan. This information is not intended to replace advice given to you by your health care provider. Make sure you discuss any questions you have with your health care provider. Document Revised: 05/04/2020 Document Reviewed: 05/04/2020 Elsevier Patient Education  2022 [place].  Nonalcoholic Fatty Liver Disease Diet, Adult Nonalcoholic fatty liver disease is a condition that causes fat to build up in and around the liver. The disease makes it harder for the liver to work the way that it should. Following a healthy diet can help to keep nonalcoholic fatty liver disease under control. It can also help to prevent or improve conditions that are associated with the disease, such as heart disease, diabetes, high blood pressure, and abnormal cholesterol levels. Along with regular  exercise, this diet: Promotes weight loss. Helps to control blood sugar  levels. Helps to improve the way that the body uses insulin. What are tips for following this plan? Reading food labels Always check food labels for: The amount of saturated fat in a food. You should limit your intake of saturated fat. Saturated fat is found in foods that come from animals, including meat and dairy products such as butter, cheese, and whole milk. The amount of fiber in a food. You should choose high-fiber foods such as fruits, vegetables, and whole grains. Try to get 25-30 grams (g) of fiber a day.  Cooking When cooking, use heart-healthy oils that are high in monounsaturated fats. These include olive oil, canola oil, and avocado oil. Limit frying or deep-frying foods. Cook foods using healthy methods such as baking, boiling, steaming, and grilling instead. Meal planning You may want to keep track of how many calories you take in. Eating the right amount of calories will help you achieve a healthy weight. Meeting with a registered dietitian can help you get started. Limit how often you eat takeout and fast food. These foods are usually very high in fat, salt, and sugar. Use the glycemic index (GI) to plan your meals. The index tells you how quickly a food will raise your blood sugar. Choose low-GI foods (GI less than 55). These foods take a longer time to raise blood sugar. A registered dietitian can help you identify foods lower on the GI scale. Lifestyle You may want to follow a Mediterranean diet. This diet includes a lot of vegetables, lean meats or fish, whole grains, fruits, and healthy oils and fats. What foods can I eat? Fruits Bananas. Apples. Oranges. Grapes. Papaya. Mango. Pomegranate. Kiwi. Grapefruit. Cherries. Vegetables Lettuce. Spinach. Peas. Beets. Cauliflower. Cabbage. Broccoli. Carrots. Tomatoes. Squash. Eggplant. Herbs. Peppers. Onions. Cucumbers. Brussels sprouts. Yams and sweet potatoes. Beans. Lentils. Grains Whole wheat or whole-grain foods, including  breads, crackers, cereals, and pasta. Stone-ground whole wheat. Unsweetened oatmeal. Bulgur. Barley. Quinoa. Brown or wild rice. Corn or whole wheat flour tortillas. Meats and other proteins Lean meats. Poultry. Tofu. Seafood and shellfish. Dairy Low-fat or fat-free dairy products, such as yogurt, cottage cheese, or cheese. Beverages Water. Sugar-free drinks. Tea. Coffee. Low-fat or skim milk. Milk alternatives, such as soy or almond milk. Real fruit juice. Fats and oils Avocado. Canola or olive oil. Nuts and nut butters. Seeds. Seasonings and condiments Mustard. Relish. Low-fat, low-sugar ketchup and barbecue sauce. Low-fat or fat-free mayonnaise. Sweets and desserts Sugar-free sweets. The items listed above may not be a complete list of foods and beverages you can eat. Contact a dietitian for more information. What foods should I limit or avoid? Meats and other proteins Limit red meat to 1-2 times a week. Dairy [employer]. Fats and oils Palm oil and coconut oil. Fried foods. Other foods Processed foods. Foods that contain a lot of salt or sodium. Sweets and desserts Sweets that contain sugar. Beverages Sweetened drinks, such as sweet tea, milkshakes, iced sweet drinks, and sodas. Alcohol. The items listed above may not be a complete list of foods and beverages you should avoid. Contact a dietitian for more information. Where to find more information The [employer] of Diabetes and Digestive and Kidney Diseases: [email or web address] Summary Nonalcoholic fatty liver disease is a condition that causes fat to build up in and around the liver. Following a healthy diet can help to keep nonalcoholic fatty liver disease under control. Your diet should be rich in fruits,  vegetables, whole grains, and lean proteins. Limit your intake of saturated fat. Saturated fat is found in foods that come from animals, including meat and dairy products such as butter, cheese, and whole milk. This  diet promotes weight loss, helps to control blood sugar levels, and helps to improve the way that the body uses insulin. This information is not intended to replace advice given to you by your health care provider. Make sure you discuss any questions you have with your health care provider. Document Revised: 04/09/2019 Document Reviewed: 01/07/2019 Elsevier Patient Education  [place].

## 2022-03-05 NOTE — Telephone Encounter (Signed)
Can we get copy of last colonoscopy and path from Dr. Posey Pronto? Around 2017. ? ?Can we NIC for colonoscopy in 2027. ?

## 2022-03-05 NOTE — H&P (Signed)
[facility] History and Physical  Reason for Referral: Gallstones, gallstone pancreatitis  Referring Physician: Lindell Spar, MD  Chief Complaint   New [name]     Syd[place] Taylor is a 56 y.o. female.  HPI: [place] Taylor is a 55 yo with DM, fibromyaglia, GERD who was seen  in [place] ED a few weeks ago with an elevated lipase and concern for pancreatitis. She had no changes in her medicine, normal TG and no other findings to indicate any reason for pancreatitis. She does not drink alcohol. US demonstrates stones. She had a CT at [place] [facility] visit without other concerning features. She says her pain was in [place] epigastric, uper abdomen area and radiated to her pain. She has had similar episodes of pain in [place] past. She says that she had RUQ pain in [place] past too but did not think too much about it.  She has had cardiac follow up with Dr. Harrington Challenger for Sydney epigastric /chest pain and felt that this was atypical and that Sydney Taylor had some tachycardia controlled on medicine.   Past Medical History:  Diagnosis Date   Anemia    Anxiety    Arrhythmia    Bipolar depression ([place]) 12/28/2019   Chronic fatigue    Chronic headaches    Depression    Diabetes mellitus without complication (HCC)    Fibromyalgia    GERD (gastroesophageal reflux disease)    High cholesterol    HLD (hyperlipidemia)    Hypertension    Insect bite    Migraine    Prediabetes    Sleep apnea    Urinary incontinence     Past Surgical History:  Procedure Laterality Date   ESOPHAGOGASTRODUODENOSCOPY N/A 05/12/2020   Procedure: ESOPHAGOGASTRODUODENOSCOPY (EGD);  Surgeon: Daneil Dolin, MD;  Location: [facility];  Service: Endoscopy;  Laterality: N/A;  11:15am   Joint fusion on foot Right    MALONEY DILATION N/A 05/12/2020   Procedure: MALONEY DILATION;  Surgeon: Daneil Dolin, MD;  Location: [facility];  Service: Endoscopy;  Laterality: N/A;   NASAL SEPTUM SURGERY     SINOSCOPY       Family History  Problem Relation Age of Onset   Hypertension Father    Hyperlipidemia Father    Diabetes Father    Stroke Father    Depression Father    Anxiety disorder Father    Hypertension Mother    Hyperlipidemia Mother    Diabetes Mellitus II Mother    Congestive Heart Failure Mother    Heart attack Mother    Hypertension Sister    Diabetes Sister    Hypertension Brother    Autoimmune disease Brother    Diabetes Maternal Grandmother    Lupus Paternal Grandmother    Depression Paternal Grandmother    Anxiety disorder Paternal Grandmother    Depression Maternal Aunt    Anxiety disorder Maternal Aunt    Colon cancer Neg Hx    Esophageal cancer Neg Hx    Gastric cancer Neg Hx     Social History   Tobacco Use   Smoking status: Former    Types: Cigarettes    Quit date: 12/30/1982    Years since quitting: 39.2   Smokeless tobacco: Never  Vaping Use   Vaping Use: Never used  Substance Use Topics   Alcohol use: Not Currently    Alcohol/week: 0.0 standard drinks   Drug use: Never    Medications: I have reviewed [place] Taylor's  current medications. Allergies as of 03/04/2022       Reactions   Cephalexin Itching, Swelling, Other (See Comments)        Medication List        Accurate as of March 04, 2022 11:02 AM. If you have any questions, ask your nurse or doctor.          Accu-Chek Guide test strip Generic drug: glucose blood 1 each by Other route in [place] morning, at noon, and at bedtime. Use as instructed   ALPRAZolam 0.5 MG tablet Commonly known as: XANAX Take 0.5 mg by mouth daily as needed.   blood glucose meter kit and supplies Kit Inject 1 each into [place] skin daily. Check blood glucose once a day. Diagnosis code: E 11.9.   buPROPion ER 100 MG 12 hr tablet Commonly known as: WELLBUTRIN SR Take 100 mg by mouth daily.   chlorthalidone 25 MG tablet Commonly known as: HYGROTON Take 0.5 tablets (12.5 mg total) by mouth daily.   Dexcom G6  Sensor Misc by Does not apply route.   Dexcom G6 Transmitter Misc by Does not apply route.   DULoxetine 30 MG capsule Commonly known as: CYMBALTA 1 CAPSULE IN [place] AFTERNOON (CONTINUE 60 MG IN [place] MORNING FOR TOTAL 90 MG DAILY) ORALLY ONCE A DAY   DULoxetine 60 MG capsule Commonly known as: CYMBALTA Take 60 mg by mouth every morning.   glipiZIDE 10 MG tablet Commonly known as: GLUCOTROL TAKE 1 TABLET BY MOUTH TWICE DAILY BEFORE A MEAL   Jardiance 10 MG Tabs tablet Generic drug: empagliflozin TAKE 1 TABLET(10 MG) BY MOUTH DAILY BEFORE BREAKFAST   Klor-Con M20 20 MEQ tablet Generic drug: potassium chloride SA TAKE 2 TABLETS BY MOUTH TWICE A DAY What changed:  how much to take when to take this   L-Methylfolate 15 MG Tabs Take 15 mg by mouth daily at 6 (six) AM.   losartan 25 MG tablet Commonly known as: COZAAR TAKE 1 TABLET(25 MG) BY MOUTH DAILY   metFORMIN 500 MG 24 hr tablet Commonly known as: GLUCOPHAGE-XR Take 2 tablets (1,000 mg total) by mouth daily with breakfast. What changed: when to take this   metoprolol tartrate 25 MG tablet Commonly known as: LOPRESSOR Take 1 tablet (25 mg total) by mouth 2 (two) times daily.   Multivitamin Women 50+ Tabs Take 1 tablet by mouth daily.   Pen Needles 32G X 4 MM Misc Check BS 2 x a day   Probiotic 250 MG Caps Take 1 capsule by mouth daily.   PROTONIX PO Take 40 mg by mouth daily.   rosuvastatin 10 MG tablet Commonly known as: CRESTOR Take 1 tablet (10 mg total) by mouth daily. What changed: when to take this   SUMAtriptan 50 MG tablet Commonly known as: IMITREX MAY REPEAT IN 2 HOURS IF HEADACHE PERSISTS OR RECURS. MAXIMUM 2 PER DAY.   Toujeo Max SoloStar 300 UNIT/ML Solostar Pen Generic drug: insulin glargine (2 Unit Dial) Inject 106 Units into [place] skin every morning. And pen needles 1/day   traZODone 50 MG tablet Commonly known as: DESYREL Take 12.5 mg by mouth at bedtime as needed.   Vitamin B12 1000  MCG Tbcr Take 1,000 mcg by mouth daily.   Vitamin D3 125 MCG (5000 UT) Tabs Take 2 tablets (10,000 Units total) by mouth daily.         ROS:  A comprehensive review of systems was negative except for: Cardiovascular: positive for chest pain Gastrointestinal: positive for  abdominal pain, nausea, and reflux symptoms Musculoskeletal: positive for back pain, neck pain, and joint pain, fibromyaglia Neurological: positive for numbness Endocrine: positive for temperature intolerance and diabetes  Blood pressure 119/79, pulse 84, temperature 98.6 F (37 C), temperature source Oral, resp. rate 14, height _0  (1.753 m), weight 264 lb (119.7 kg), SpO2 95 %. Physical Exam Vitals reviewed.  Constitutional:      Appearance: She is obese.  HENT:     Head: Normocephalic.     Nose: Nose normal.     Mouth/Throat:     Mouth: Mucous membranes are moist.  Eyes:     Extraocular Movements: Extraocular movements intact.  Cardiovascular:     Rate and Rhythm: Normal rate and regular rhythm.  Pulmonary:     Effort: Pulmonary effort is normal.     Breath sounds: Normal breath sounds.  Abdominal:     General: There is no distension.     Palpations: Abdomen is soft.     Tenderness: There is abdominal tenderness in [place] right upper quadrant and epigastric area.  Musculoskeletal:        General: Normal range of motion.  Skin:    General: Skin is warm.  Neurological:     General: No focal deficit present.     Mental Status: She is alert.  Psychiatric:        Mood and Affect: Mood normal.        Behavior: Behavior normal.    Results: CLINICAL DATA:  Initial evaluation for recent acute pancreatitis.   EXAM: ULTRASOUND ABDOMEN LIMITED RIGHT UPPER QUADRANT   COMPARISON:  Prior CT from 02/23/2021.   FINDINGS: Gallbladder:   Multiple echogenic calculi seen within [place] gallbladder lumen, largest of which measures 7 mm. Gallbladder wall measures at Sydney upper limits of normal at 3 mm. No free  pericholecystic fluid. No sonographic Murphy sign elicited on exam.   Common bile duct:   Diameter: 6.1 mm   Liver:   No focal lesion identified. Liver demonstrates a dense echogenic echotexture, suggesting steatosis. Portal vein is patent on color Doppler imaging with normal direction of blood flow towards Sydney liver.   Other: None.   IMPRESSION: 1. Cholelithiasis, with no sonographic features to suggest acute cholecystitis. 2. No biliary dilatation. 3. Hepatic steatosis.     Electronically Signed   By: Jeannine Boga M.D.   On: 02/28/2022 16:36   CLINICAL DATA:  Pt c/o nausea and increased acid reflux over past several weeks. Diverticulitis suspected.   EXAM: CT ABDOMEN AND PELVIS WITH CONTRAST   TECHNIQUE: Multidetector CT imaging of [place] abdomen and pelvis was performed using Sydney standard protocol following bolus administration of intravenous contrast.   CONTRAST:  149m OMNIPAQUE IOHEXOL 300 MG/ML  SOLN   COMPARISON:  None.   FINDINGS: Lower chest: Atelectasis.  No acute abnormality.   Hepatobiliary: [place] liver is enlarged measuring up to 25 cm. [place] hepatic parenchyma is diffusely hypodense compared to [place] splenic parenchyma consistent with fatty infiltration. No focal liver abnormality. No gallstones, gallbladder wall thickening, or pericholecystic fluid. No biliary dilatation.   Pancreas: No focal lesion. Normal pancreatic contour. No surrounding inflammatory changes. No main pancreatic ductal dilatation.   Spleen: There is a 2.1 cm hypodensity within Sydney splenic parenchyma. Otherwise Sydney normal in size without other focal abnormality.   Adrenals/Urinary Tract: No adrenal nodule bilaterally. Bilateral kidneys enhance symmetrically. No hydronephrosis. No hydroureter. [place] urinary bladder is unremarkable. On delayed imaging, there is no urothelial wall thickening and there  are no filling defects in [place] opacified portions of Sydney bilateral collecting  systems or ureters.   Stomach/Bowel: PO contrast reaches [place] descending colon. Stomach is within normal limits. No evidence of bowel wall thickening or dilatation. Appendix appears normal.   Vascular/Lymphatic: No abdominal aorta or iliac aneurysm. Mild atherosclerotic plaque of [place] aorta and its branches. No abdominal, pelvic, or inguinal lymphadenopathy.   Reproductive: Suggestion of a 2.4 cm lesion that is isodense to Sydney uterine wall arising from Sydney anterior uterine wall that likely representing a subserosal or intramural fibroid. Uterus and bilateral adnexa are unremarkable.   Other: No intraperitoneal free fluid. No intraperitoneal free gas. No organized fluid collection.   Musculoskeletal:   No abdominal wall hernia or abnormality.   No suspicious lytic or blastic osseous lesions. No acute displaced fracture. Multilevel degenerative changes of [place] spine.   IMPRESSION: 1. Likely uterine fibroid. Consider pelvic ultrasound if clinically indicated. 2. Hepatomegaly and hepatic steatosis. 3. Indeterminate 2.1 cm splenic hypodensity-likely benign entity Correlation with prior cross-sectional imaging would be of value. 4. Otherwise no acute intra abdominal or intrapelvic abnormality.     Electronically Signed   By: Iven Finn M.D.   On: 02/24/2021 19:46   Latest Reference Range & Units 12/14/21 10:12  Total CHOL/HDL Ratio 0.0 - 4.4 ratio 3.8  Cholesterol, Total 100 - 199 mg/dL 136  HDL Cholesterol >39 mg/dL 36 (L)  Triglycerides 0 - 149 mg/dL 189 (H)  VLDL Cholesterol Cal 5 - 40 mg/dL 32  LDL Chol Calc (NIH) 0 - 99 mg/dL 68    Assessment & Plan:  GHALIA REICKS is a 56 y.o. female with gallstones and concern for gallstone pancreatitis given no other etiology noted.  -PLAN: I counseled Sydney Taylor about [place] indication, risks and benefits of laparoscopic cholecystectomy.  She understands there is a very small chance for bleeding, infection, injury to normal  structures (including common bile duct), conversion to open surgery, persistent symptoms, evolution of postcholecystectomy diarrhea, need for secondary interventions, anesthesia reaction, cardiopulmonary issues and other risks not specifically detailed here. I described Sydney expected recovery, Sydney plan for follow-up and Sydney restrictions during Sydney recovery phase.  All questions were answered.  Will also plan for an intraoperative cholangiogram given Sydney pancreatitis and potential for stones in [place] duct. Discussed risk of allergic reaction.   All questions were answered to [place] satisfaction of Sydney Taylor.     Virl Cagey 03/04/2022, 11:02 AM

## 2022-03-06 ENCOUNTER — Encounter: Payer: Self-pay | Admitting: Internal Medicine

## 2022-03-06 ENCOUNTER — Other Ambulatory Visit: Payer: Self-pay | Admitting: Internal Medicine

## 2022-03-06 DIAGNOSIS — L719 Rosacea, unspecified: Secondary | ICD-10-CM

## 2022-03-06 MED ORDER — METRONIDAZOLE 1 % EX GEL: Freq: Every day | CUTANEOUS | 0 refills | Status: DC

## 2022-03-06 NOTE — Telephone Encounter (Signed)
Requested records from Dr Serita Grit office in [place] and added to recall. ?

## 2022-03-07 MED ORDER — METRONIDAZOLE 0.75 % EX GEL: 1.0000 "application " | Freq: Two times a day (BID) | CUTANEOUS | 0 refills | Status: DC

## 2022-03-07 NOTE — Addendum Note (Signed)
Addended byIhor Dow on: 03/07/2022 03:57 PM ? ? Modules accepted: Orders ? ?

## 2022-03-11 DIAGNOSIS — R002 Palpitations: Secondary | ICD-10-CM

## 2022-03-11 NOTE — Patient Instructions (Signed)
Sydney Taylor  03/11/2022     '@[email or web address]'$ @   Your procedure is scheduled on   03/15/2022.    Report to [facility] at  0600 A.M.   Call this number if you have problems the morning of surgery:  [phone]   Remember:  Do not eat or drink after midnight.    DO NOT take any medications for diabetes the morning of your procedure.   Bring extra dexcom supplies in case your sensor becomes dislodged during your procedure.     Take these medicines the morning of surgery with A SIP OF WATER   xanax(if needed), wellbutrin, cumbalta, metoprolol, protonix, imitrex.    Do not wear jewelry, make-up or nail polish.  Do not wear lotions, powders, or perfumes, or deodorant.  Do not shave 48 hours prior to surgery.  Men may shave face and neck.  Do not bring valuables to the hospital.  [facility] is not responsible for any belongings or valuables.  Contacts, dentures or bridgework may not be worn into surgery.  Leave your suitcase in the car.  After surgery it may be brought to your room.  For patients admitted to the hospital, discharge time will be determined by your treatment team.  Patients discharged the day of surgery will not be allowed to drive home and must have someone with them for 24 hours.    Special instructions:   DO NOT smoke tobacco or vape for 24 hours before your procedure.  Please read over the following fact sheets that you were given. Coughing and Deep Breathing, Surgical Site Infection Prevention, Anesthesia Post-op Instructions, and Care and Recovery After Surgery      Minimally Invasive Cholecystectomy, Care After The following information offers guidance on how to care for yourself after your procedure. Your health care provider may also give you more specific instructions. If you have problems or questions, contact your health care provider. What can I expect after the procedure? After the procedure, it is common to  have: Pain at your incision sites. You will be given medicines to control this pain. Mild nausea or vomiting. Bloating and possible shoulder pain from the gas that was used during the procedure. Follow these instructions at home: Medicines Take over-the-counter and prescription medicines only as told by your health care provider. If you were prescribed an antibiotic medicine, take it as told by your health care provider. Do not stop using the antibiotic even if you start to feel better. Ask your health care provider if the medicine prescribed to you: Requires you to avoid driving or using machinery. Can cause constipation. You may need to take these actions to prevent or treat constipation: Drink enough fluid to keep your urine pale yellow. Take over-the-counter or prescription medicines. Eat foods that are high in fiber, such as beans, whole grains, and fresh fruits and vegetables. Limit foods that are high in fat and processed sugars, such as fried or sweet foods. Incision care  Follow instructions from your health care provider about how to take care of your incisions. Make sure you: Wash your hands with soap and water for at least 20 seconds before and after you change your bandage (dressing). If soap and water are not available, use hand sanitizer. Change your dressing as told by your health care provider. Leave stitches (sutures), skin glue, or adhesive strips in place. These skin closures may need to be in place for 2 weeks or longer. If  adhesive strip edges start to loosen and curl up, you may trim the loose edges. Do not remove adhesive strips completely unless your health care provider tells you to do that. Do not take baths, swim, or use a hot tub until your health care provider approves. Ask your health care provider if you may take showers. You may only be allowed to take sponge baths. Check your incision area every day for signs of infection. Check for: More redness, swelling, or  pain. Fluid or blood. Warmth. Pus or a bad smell. Activity Rest as told by your health care provider. Do not do activities that require a lot of effort. Avoid sitting for a long time without moving. Get up to take short walks every 1-2 hours. This is important to improve blood flow and breathing. Ask for help if you feel weak or unsteady. Do not lift anything that is heavier than 10 lb (4.5 kg), or the limit that you are told, until your health care provider says that it is safe. Do not play contact sports until your health care provider approves. Do not return to work or school until your health care provider approves. Return to your normal activities as told by your health care provider. Ask your health care provider what activities are safe for you. General instructions If you were given a sedative during the procedure, it can affect you for several hours. Do not drive or operate machinery until your health care provider says that it is safe. Keep all follow-up visits. This is important. Contact a health care provider if: You develop a rash. You have more redness, swelling, or pain around your incisions. You have fluid or blood coming from your incisions. Your incisions feel warm to the touch. You have pus or a bad smell coming from your incisions. You have a fever. One or more of your incisions breaks open. Get help right away if: You have trouble breathing. You have chest pain. You have more pain in your shoulders. You faint or feel dizzy when you stand. You have severe pain in your abdomen. You have nausea or vomiting that lasts for more than one day. You have leg pain that is new or unusual, or if it is localized to one specific spot. These symptoms may represent a serious problem that is an emergency. Do not wait to see if the symptoms will go away. Get medical help right away. Call your local emergency services (911 in the [place] Do not drive yourself to the  hospital. Summary After your procedure, it is common to have pain at the incision sites. You may also have nausea or bloating. Follow your health care provider's instructions about medicine, activity restrictions, and caring for your incision areas. Do not do activities that require a lot of effort. Contact a health care provider if you have a fever or other signs of infection, such as more redness, swelling, or pain around the incisions. Get help right away if you have chest pain, increasing pain in the shoulders, or trouble breathing. This information is not intended to replace advice given to you by your health care provider. Make sure you discuss any questions you have with your health care provider. Document Revised: 06/19/2021 Document Reviewed: 06/19/2021 Elsevier Patient Education  [place] Anesthesia, Adult, Care After This sheet gives you information about how to care for yourself after your procedure. Your health care provider may also give you more specific instructions. If you have problems or questions, contact  your health care provider. What can I expect after the procedure? After the procedure, the following side effects are common: Pain or discomfort at the IV site. Nausea. Vomiting. Sore throat. Trouble concentrating. Feeling cold or chills. Feeling weak or tired. Sleepiness and fatigue. Soreness and body aches. These side effects can affect parts of the body that were not involved in surgery. Follow these instructions at home: For the time period you were told by your health care provider:  Rest. Do not participate in activities where you could fall or become injured. Do not drive or use machinery. Do not drink alcohol. Do not take sleeping pills or medicines that cause drowsiness. Do not make important decisions or sign legal documents. Do not take care of children on your own. Eating and drinking Follow any instructions from your health care  provider about eating or drinking restrictions. When you feel hungry, start by eating small amounts of foods that are soft and easy to digest (bland), such as toast. Gradually return to your regular diet. Drink enough fluid to keep your urine pale yellow. If you vomit, rehydrate by drinking water, juice, or clear broth. General instructions If you have sleep apnea, surgery and certain medicines can increase your risk for breathing problems. Follow instructions from your health care provider about wearing your sleep device: Anytime you are sleeping, including during daytime naps. While taking prescription pain medicines, sleeping medicines, or medicines that make you drowsy. Have a responsible adult stay with you for the time you are told. It is important to have someone help care for you until you are awake and alert. Return to your normal activities as told by your health care provider. Ask your health care provider what activities are safe for you. Take over-the-counter and prescription medicines only as told by your health care provider. If you smoke, do not smoke without supervision. Keep all follow-up visits as told by your health care provider. This is important. Contact a health care provider if: You have nausea or vomiting that does not get better with medicine. You cannot eat or drink without vomiting. You have pain that does not get better with medicine. You are unable to pass urine. You develop a skin rash. You have a fever. You have redness around your IV site that gets worse. Get help right away if: You have difficulty breathing. You have chest pain. You have blood in your urine or stool, or you vomit blood. Summary After the procedure, it is common to have a sore throat or nausea. It is also common to feel tired. Have a responsible adult stay with you for the time you are told. It is important to have someone help care for you until you are awake and alert. When you feel  hungry, start by eating small amounts of foods that are soft and easy to digest (bland), such as toast. Gradually return to your regular diet. Drink enough fluid to keep your urine pale yellow. Return to your normal activities as told by your health care provider. Ask your health care provider what activities are safe for you. This information is not intended to replace advice given to you by your health care provider. Make sure you discuss any questions you have with your health care provider. Document Revised: 08/31/2020 Document Reviewed: 03/30/2020 Elsevier Patient Education  2022 [employer]. How to Use Chlorhexidine for Bathing Chlorhexidine gluconate (CHG) is a germ-killing (antiseptic) solution that is used to clean the skin. It can get rid of the  bacteria that normally live on the skin and can keep them away for about 24 hours. To clean your skin with CHG, you may be given: A CHG solution to use in the shower or as part of a sponge bath. A prepackaged cloth that contains CHG. Cleaning your skin with CHG may help lower the risk for infection: While you are staying in the intensive care unit of the hospital. If you have a vascular access, such as a central line, to provide short-term or long-term access to your veins. If you have a catheter to drain urine from your bladder. If you are on a ventilator. A ventilator is a machine that helps you breathe by moving air in and out of your lungs. After surgery. What are the risks? Risks of using CHG include: A skin reaction. Hearing loss, if CHG gets in your ears and you have a perforated eardrum. Eye injury, if CHG gets in your eyes and is not rinsed out. The CHG product catching fire. Make sure that you avoid smoking and flames after applying CHG to your skin. Do not use CHG: If you have a chlorhexidine allergy or have previously reacted to chlorhexidine. On babies younger than 41 months of age. How to use CHG solution Use CHG only as  told by your health care provider, and follow the instructions on the label. Use the full amount of CHG as directed. Usually, this is one bottle. During a shower Follow these steps when using CHG solution during a shower (unless your health care provider gives you different instructions): Start the shower. Use your normal soap and shampoo to wash your face and hair. Turn off the shower or move out of the shower stream. Pour the CHG onto a clean washcloth. Do not use any type of brush or rough-edged sponge. Starting at your neck, lather your body down to your toes. Make sure you follow these instructions: If you will be having surgery, pay special attention to the part of your body where you will be having surgery. Scrub this area for at least 1 minute. Do not use CHG on your head or face. If the solution gets into your ears or eyes, rinse them well with water. Avoid your genital area. Avoid any areas of skin that have broken skin, cuts, or scrapes. Scrub your back and under your arms. Make sure to wash skin folds. Let the lather sit on your skin for 1-2 minutes or as long as told by your health care provider. Thoroughly rinse your entire body in the shower. Make sure that all body creases and crevices are rinsed well. Dry off with a clean towel. Do not put any substances on your body afterward--such as powder, lotion, or perfume--unless you are told to do so by your health care provider. Only use lotions that are recommended by the manufacturer. Put on clean clothes or pajamas. If it is the night before your surgery, sleep in clean sheets.  During a sponge bath Follow these steps when using CHG solution during a sponge bath (unless your health care provider gives you different instructions): Use your normal soap and shampoo to wash your face and hair. Pour the CHG onto a clean washcloth. Starting at your neck, lather your body down to your toes. Make sure you follow these instructions: If  you will be having surgery, pay special attention to the part of your body where you will be having surgery. Scrub this area for at least 1 minute. Do not use  CHG on your head or face. If the solution gets into your ears or eyes, rinse them well with water. Avoid your genital area. Avoid any areas of skin that have broken skin, cuts, or scrapes. Scrub your back and under your arms. Make sure to wash skin folds. Let the lather sit on your skin for 1-2 minutes or as long as told by your health care provider. Using a different clean, wet washcloth, thoroughly rinse your entire body. Make sure that all body creases and crevices are rinsed well. Dry off with a clean towel. Do not put any substances on your body afterward--such as powder, lotion, or perfume--unless you are told to do so by your health care provider. Only use lotions that are recommended by the manufacturer. Put on clean clothes or pajamas. If it is the night before your surgery, sleep in clean sheets. How to use CHG prepackaged cloths Only use CHG cloths as told by your health care provider, and follow the instructions on the label. Use the CHG cloth on clean, dry skin. Do not use the CHG cloth on your head or face unless your health care provider tells you to. When washing with the CHG cloth: Avoid your genital area. Avoid any areas of skin that have broken skin, cuts, or scrapes. Before surgery Follow these steps when using a CHG cloth to clean before surgery (unless your health care provider gives you different instructions): Using the CHG cloth, vigorously scrub the part of your body where you will be having surgery. Scrub using a back-and-forth motion for 3 minutes. The area on your body should be completely wet with CHG when you are done scrubbing. Do not rinse. Discard the cloth and let the area air-dry. Do not put any substances on the area afterward, such as powder, lotion, or perfume. Put on clean clothes or pajamas. If it  is the night before your surgery, sleep in clean sheets.  For general bathing Follow these steps when using CHG cloths for general bathing (unless your health care provider gives you different instructions). Use a separate CHG cloth for each area of your body. Make sure you wash between any folds of skin and between your fingers and toes. Wash your body in the following order, switching to a new cloth after each step: The front of your neck, shoulders, and chest. Both of your arms, under your arms, and your hands. Your stomach and groin area, avoiding the genitals. Your right leg and foot. Your left leg and foot. The back of your neck, your back, and your buttocks. Do not rinse. Discard the cloth and let the area air-dry. Do not put any substances on your body afterward--such as powder, lotion, or perfume--unless you are told to do so by your health care provider. Only use lotions that are recommended by the manufacturer. Put on clean clothes or pajamas. Contact a health care provider if: Your skin gets irritated after scrubbing. You have questions about using your solution or cloth. You swallow any chlorhexidine. Call your local poison control center (1-912-447-9003 in the [place] Get help right away if: Your eyes itch badly, or they become very red or swollen. Your skin itches badly and is red or swollen. Your hearing changes. You have trouble seeing. You have swelling or tingling in your mouth or throat. You have trouble breathing. These symptoms may represent a serious problem that is an emergency. Do not wait to see if the symptoms will go away. Get medical help right away.  Call your local emergency services (911 in the [place] Do not drive yourself to the hospital. Summary Chlorhexidine gluconate (CHG) is a germ-killing (antiseptic) solution that is used to clean the skin. Cleaning your skin with CHG may help to lower your risk for infection. You may be given CHG to use for bathing. It  may be in a bottle or in a prepackaged cloth to use on your skin. Carefully follow your health care provider's instructions and the instructions on the product label. Do not use CHG if you have a chlorhexidine allergy. Contact your health care provider if your skin gets irritated after scrubbing. This information is not intended to replace advice given to you by your health care provider. Make sure you discuss any questions you have with your health care provider. Document Revised: 02/26/2021 Document Reviewed: 02/26/2021 Elsevier Patient Education  2022 [employer].

## 2022-03-12 ENCOUNTER — Other Ambulatory Visit: Payer: Self-pay

## 2022-03-12 ENCOUNTER — Encounter: Payer: Self-pay | Admitting: Internal Medicine

## 2022-03-12 ENCOUNTER — Ambulatory Visit: Payer: Medicare Other | Admitting: Endocrinology

## 2022-03-12 ENCOUNTER — Other Ambulatory Visit: Payer: Self-pay | Admitting: *Deleted

## 2022-03-12 VITALS — BP 122/80 | HR 91 | Ht 69.0 in | Wt 262.8 lb

## 2022-03-12 DIAGNOSIS — E1165 Type 2 diabetes mellitus with hyperglycemia: Secondary | ICD-10-CM

## 2022-03-12 LAB — POCT GLYCOSYLATED HEMOGLOBIN (HGB A1C): Hemoglobin A1C: 8 % — AB (ref 4.0–5.6)

## 2022-03-12 MED ORDER — TOUJEO MAX SOLOSTAR 300 UNIT/ML ~~LOC~~ SOPN: 124.0000 [IU] | PEN_INJECTOR | SUBCUTANEOUS | 3 refills | Status: DC

## 2022-03-12 MED ORDER — PANTOPRAZOLE SODIUM 40 MG PO TBEC: 40.0000 mg | DELAYED_RELEASE_TABLET | Freq: Every day | ORAL | 0 refills | Status: DC

## 2022-03-12 NOTE — Progress Notes (Signed)
? ?Subjective:  ? ? Patient ID: Sydney Taylor, female    DOB: 13-Jul-1966, 56 y.o.   MRN: [id number] ? ?HPI ?Pt returns for f/u of diabetes mellitus:  ?DM type: Insulin-requiring type 2 ?Dx'ed: 2021 ?Complications: PN ?Therapy: insulin since 2022, and 3 oral meds.   ?GDM: never ?DKA: never ?Severe hypoglycemia: never.   ?Pancreatitis: once (2023, GB).  Trulicity was stopped.   ?Pancreatic imaging: normal on 2022 CT.   ?SDOH: therapy has been limited by pt's request for least expensive meds.   ?Other: she takes QD insulin, at least for now; she eats meals at 7AM, 12N, and 6-7PM.   ?Interval hx: I reviewed continuous glucose monitor data.  Glucose varies from 120-250.  It is in general highest at 7-9PM, and less high at 10AM.  It is lowest at 6AM.  It decreases overnight.  She takes meds as rx'ed.  ?Past Medical History:  ?Diagnosis Date  ? Anemia   ? Anxiety   ? Arrhythmia   ? Bipolar depression ([place]) 12/28/2019  ? Chronic fatigue   ? Chronic headaches   ? Depression   ? Diabetes mellitus without complication ([place])   ? Fibromyalgia   ? GERD (gastroesophageal reflux disease)   ? High cholesterol   ? HLD (hyperlipidemia)   ? Hypertension   ? Insect bite   ? Migraine   ? Prediabetes   ? Sleep apnea   ? Urinary incontinence   ? ? ?Past Surgical History:  ?Procedure Laterality Date  ? COLONOSCOPY  2017  ? per patient normal and needs next one in 2027 (Dr. Posey Pronto)  ? ESOPHAGOGASTRODUODENOSCOPY N/A 05/12/2020  ? Procedure: ESOPHAGOGASTRODUODENOSCOPY (EGD);  Surgeon: Daneil Dolin, MD;  [facility]: [facility];  Service: Endoscopy;  Laterality: N/A;  11:15am  ? Joint fusion on foot Right   ? MALONEY DILATION N/A 05/12/2020  ? Procedure: MALONEY DILATION;  Surgeon: Daneil Dolin, MD;  [facility]: [facility];  Service: Endoscopy;  Laterality: N/A;  ? NASAL SEPTUM SURGERY    ? SINOSCOPY    ? ? ?Social History  ? ?Socioeconomic History  ? Marital status: Married  ?  Spouse name: Not on file  ? Number of children: 0  ?  Years of education: Not on file  ? Highest education level: Bachelor's degree (e.g., BA, AB, BS)  ?Occupational History  ? Occupation: Engineer, mining development  ?Tobacco Use  ? Smoking status: Former  ?  Types: Cigarettes  ?  Quit date: 12/30/1982  ?  Years since quitting: 39.2  ? Smokeless tobacco: Never  ?Vaping Use  ? Vaping Use: Never used  ?Substance and Sexual Activity  ? Alcohol use: Not Currently  ?  Alcohol/week: 0.0 standard drinks  ? Drug use: Never  ? Sexual activity: Yes  ?  Birth control/protection: Post-menopausal  ?Other Topics Concern  ? Not on file  ?Social History Narrative  ? Married for 3 years.Lives with husband.Bachelors in Education officer, museum.  ? Right handed  ? Caffeine: 1-2 per day   ? ?Social Determinants of Health  ? ?Financial Resource Strain: Not on file  ?Food Insecurity: No Food Insecurity  ? Worried About Charity fundraiser in the Last Year: Never true  ? Ran Out of Food in the Last Year: Never true  ?Transportation Needs: No Transportation Needs  ? Lack of Transportation (Medical): No  ? Lack of Transportation (Non-Medical): No  ?Physical Activity: Not on file  ?Stress: Not on file  ?Social Connections: Not on  file  ?Intimate Partner Violence: Not on file  ? ? ?Current Outpatient Medications on File Prior to Visit  ?Medication Sig Dispense Refill  ? ALPRAZolam (XANAX) 0.5 MG tablet Take 0.25 mg by mouth daily as needed for anxiety.    ? Aspirin-Caffeine (BC FAST PAIN RELIEF PO) Take 1 packet by mouth daily as needed (pain).    ? B-12 Methylcobalamin 1000 MCG TBDP Take 1,000 mcg by mouth daily.    ? blood glucose meter kit and supplies KIT Inject 1 each into the skin daily. Check blood glucose once a day. Diagnosis code: E 11.9. 1 each 0  ? buPROPion ER (WELLBUTRIN SR) 100 MG 12 hr tablet Take 100 mg by mouth daily.    ? calcium carbonate (TUMS - DOSED IN MG ELEMENTAL CALCIUM) 500 MG chewable tablet Chew 1,000 mg by mouth daily as needed for indigestion or heartburn.    ?  chlorthalidone (HYGROTON) 25 MG tablet Take 0.5 tablets (12.5 mg total) by mouth daily. 45 tablet 1  ? Cholecalciferol (VITAMIN D3) 125 MCG (5000 UT) TABS Take 2 tablets (10,000 Units total) by mouth daily. 180 tablet 1  ? Continuous Blood Gluc Sensor (DEXCOM G6 SENSOR) MISC by Does not apply route.    ? Continuous Blood Gluc Transmit (DEXCOM G6 TRANSMITTER) MISC by Does not apply route.    ? DULoxetine (CYMBALTA) 30 MG capsule Take 30 mg by mouth daily in the afternoon.    ? DULoxetine (CYMBALTA) 60 MG capsule Take 60 mg by mouth every morning.    ? glucose blood (ACCU-CHEK GUIDE) test strip 1 each by Other route in the morning, at noon, and at bedtime. Use as instructed 300 each 3  ? ibuprofen (ADVIL) 200 MG tablet Take 600 mg by mouth every 6 (six) hours as needed for moderate pain or headache.    ? Insulin Pen Needle (PEN NEEDLES) 32G X 4 MM MISC Check BS 2 x a day 100 each 5  ? KLOR-CON M20 20 MEQ tablet TAKE 2 TABLETS BY MOUTH TWICE A DAY (Patient taking differently: Take 40 mEq by mouth daily.) 360 tablet 1  ? L-Methylfolate 15 MG TABS Take 15 mg by mouth daily at 6 (six) AM.    ? losartan (COZAAR) 25 MG tablet TAKE 1 TABLET(25 MG) BY MOUTH DAILY 90 tablet 0  ? metFORMIN (GLUCOPHAGE-XR) 500 MG 24 hr tablet Take 2 tablets (1,000 mg total) by mouth daily with breakfast. (Patient taking differently: Take 1,000 mg by mouth daily with supper.) 180 tablet 3  ? metoprolol tartrate (LOPRESSOR) 25 MG tablet Take 1 tablet (25 mg total) by mouth 2 (two) times daily. 180 tablet 3  ? metroNIDAZOLE (METROGEL) 0.75 % gel Apply 1 application. topically 2 (two) times daily. 45 g 0  ? Multiple Vitamins-Minerals (MULTIVITAMIN WOMEN 50+) TABS Take 1 tablet by mouth daily.    ? PREDNISOLON-MOXIFLOX-BROMFENAC OP Place 1 drop into the right eye daily.    ? Probiotic Product (PROBIOTIC PO) Take 1 capsule by mouth daily.    ? rosuvastatin (CRESTOR) 10 MG tablet Take 1 tablet (10 mg total) by mouth daily. (Patient taking differently:  Take 10 mg by mouth every other day.) 30 tablet 11  ? SUMAtriptan (IMITREX) 50 MG tablet MAY REPEAT IN 2 HOURS IF HEADACHE PERSISTS OR RECURS. MAXIMUM 2 PER DAY. 10 tablet 0  ? traZODone (DESYREL) 50 MG tablet Take 12.5 mg by mouth at bedtime.    ? ?No current facility-administered medications on file prior to visit.  ? ? ?  Allergies  ?Allergen Reactions  ? Cephalexin Itching, Swelling and Other (See Comments)  ? ? ?Family History  ?Problem Relation Age of Onset  ? Hypertension Father   ? Hyperlipidemia Father   ? Diabetes Father   ? Stroke Father   ? Depression Father   ? Anxiety disorder Father   ? Hypertension Mother   ? Hyperlipidemia Mother   ? Diabetes Mellitus II Mother   ? Congestive Heart Failure Mother   ? Heart attack Mother   ? Hypertension Sister   ? Diabetes Sister   ? Hypertension Brother   ? Autoimmune disease Brother   ? Diabetes Maternal Grandmother   ? Lupus Paternal Grandmother   ? Depression Paternal Grandmother   ? Anxiety disorder Paternal Grandmother   ? Depression Maternal Aunt   ? Anxiety disorder Maternal Aunt   ? Colon cancer Neg Hx   ? Esophageal cancer Neg Hx   ? Gastric cancer Neg Hx   ? ? ?BP 122/80 (BP Location: Left Arm, Patient Position: Sitting, Cuff Size: Normal)   Pulse 91   Ht _0  (1.753 m)   Wt 262 lb 12.8 oz (119.2 kg)   LMP  (LMP Unknown)   SpO2 97%   BMI 38.81 kg/m?  ? ? ? ?Review of Systems ? ?   ?Objective:  ? Physical Exam ? ?Lab Results  ?Component Value Date  ? CREATININE 1.14 (H) 02/10/2022  ? BUN 19 02/10/2022  ? NA 136 02/10/2022  ? K 3.4 (L) 02/10/2022  ? CL 100 02/10/2022  ? CO2 27 02/10/2022  ? ? ?Lab Results  ?Component Value Date  ? HGBA1C 8.0 (A) 03/12/2022  ? ?   ?Assessment & Plan:  ?Insulin-requiring type 2 DM: uncontrolled.   ? ?Patient Instructions  ?check your blood sugar twice a day.  vary the time of day when you check, between before the 3 meals, and at bedtime.  also check if you have symptoms of your blood sugar being too high or too low.   please keep a record of the readings and bring it to your next appointment here (or you can bring the meter itself).  You can write it on any piece of paper.  please call us sooner if your blood sugar goes below 70, or if mos

## 2022-03-12 NOTE — Patient Instructions (Addendum)
check your blood sugar twice a day.  vary the time of day when you check, between before the 3 meals, and at bedtime.  also check if you have symptoms of your blood sugar being too high or too low.  please keep a record of the readings and bring it to your next appointment here (or you can bring the meter itself).  You can write it on any piece of paper.  please call us sooner if your blood sugar goes below 70, or if most of your readings are over 200.   ?Please stop the glipizide, and: ?I have sent a prescription to your pharmacy, to increase the Toujeo to 124 units per day.   ?Please continue the same other medications, but skip all 3 pf them on the day of your surgery ?Please come back for a follow-up appointment in 1 month.   ?

## 2022-03-13 ENCOUNTER — Encounter (HOSPITAL_COMMUNITY): Payer: Self-pay

## 2022-03-13 ENCOUNTER — Encounter: Payer: Self-pay | Admitting: Internal Medicine

## 2022-03-13 ENCOUNTER — Other Ambulatory Visit: Payer: Self-pay | Admitting: *Deleted

## 2022-03-13 ENCOUNTER — Encounter (HOSPITAL_COMMUNITY)
Admission: RE | Admit: 2022-03-13 | Discharge: 2022-03-13 | Disposition: A | Discharge status: Home / Self Care | Payer: Medicare Other | Source: Ambulatory Visit | Attending: General Surgery | Admitting: General Surgery

## 2022-03-13 MED ORDER — EMPAGLIFLOZIN 10 MG PO TABS: ORAL_TABLET | ORAL | 2 refills | Status: DC

## 2022-03-14 ENCOUNTER — Encounter: Payer: Self-pay | Admitting: Internal Medicine

## 2022-03-14 ENCOUNTER — Encounter: Payer: Self-pay | Admitting: Endocrinology

## 2022-03-15 ENCOUNTER — Ambulatory Visit (HOSPITAL_COMMUNITY): Payer: Medicare Other

## 2022-03-15 ENCOUNTER — Ambulatory Visit (HOSPITAL_COMMUNITY): Payer: Medicare Other | Admitting: Certified Registered"

## 2022-03-15 ENCOUNTER — Encounter (HOSPITAL_COMMUNITY)
Admission: RE | Disposition: A | Discharge status: Home / Self Care | Payer: Self-pay | Source: Home / Self Care | Attending: General Surgery

## 2022-03-15 ENCOUNTER — Ambulatory Visit (HOSPITAL_BASED_OUTPATIENT_CLINIC_OR_DEPARTMENT_OTHER): Payer: Medicare Other | Admitting: Certified Registered"

## 2022-03-15 ENCOUNTER — Ambulatory Visit (HOSPITAL_COMMUNITY)
Admission: RE | Admit: 2022-03-15 | Discharge: 2022-03-15 | Disposition: A | Discharge status: Home / Self Care | Payer: Medicare Other | Attending: General Surgery | Admitting: General Surgery

## 2022-03-15 ENCOUNTER — Other Ambulatory Visit: Payer: Self-pay

## 2022-03-15 ENCOUNTER — Encounter (HOSPITAL_COMMUNITY): Payer: Self-pay | Admitting: General Surgery

## 2022-03-15 DIAGNOSIS — M797 Fibromyalgia: Secondary | ICD-10-CM | POA: Diagnosis not present

## 2022-03-15 DIAGNOSIS — Z419 Encounter for procedure for purposes other than remedying health state, unspecified: Secondary | ICD-10-CM

## 2022-03-15 DIAGNOSIS — K760 Fatty (change of) liver, not elsewhere classified: Secondary | ICD-10-CM

## 2022-03-15 DIAGNOSIS — K8010 Calculus of gallbladder with chronic cholecystitis without obstruction: Secondary | ICD-10-CM | POA: Diagnosis present

## 2022-03-15 DIAGNOSIS — F418 Other specified anxiety disorders: Secondary | ICD-10-CM | POA: Insufficient documentation

## 2022-03-15 DIAGNOSIS — K219 Gastro-esophageal reflux disease without esophagitis: Secondary | ICD-10-CM | POA: Insufficient documentation

## 2022-03-15 DIAGNOSIS — I10 Essential (primary) hypertension: Secondary | ICD-10-CM | POA: Diagnosis not present

## 2022-03-15 DIAGNOSIS — Z6838 Body mass index (BMI) 38.0-38.9, adult: Secondary | ICD-10-CM | POA: Insufficient documentation

## 2022-03-15 DIAGNOSIS — E119 Type 2 diabetes mellitus without complications: Secondary | ICD-10-CM | POA: Insufficient documentation

## 2022-03-15 DIAGNOSIS — F319 Bipolar disorder, unspecified: Secondary | ICD-10-CM | POA: Diagnosis not present

## 2022-03-15 DIAGNOSIS — K851 Biliary acute pancreatitis without necrosis or infection: Secondary | ICD-10-CM

## 2022-03-15 DIAGNOSIS — Z87891 Personal history of nicotine dependence: Secondary | ICD-10-CM | POA: Insufficient documentation

## 2022-03-15 HISTORY — PX: CHOLECYSTECTOMY: SHX55

## 2022-03-15 HISTORY — PX: LIVER BIOPSY: SHX301

## 2022-03-15 LAB — COMPREHENSIVE METABOLIC PANEL
ALT: 21 U/L (ref 0–44)
AST: 22 U/L (ref 15–41)
Albumin: 3.4 g/dL — ABNORMAL LOW (ref 3.5–5.0)
Alkaline Phosphatase: 66 U/L (ref 38–126)
Anion gap: 10 (ref 5–15)
BUN: 16 mg/dL (ref 6–20)
CO2: 27 mmol/L (ref 22–32)
Calcium: 8.8 mg/dL — ABNORMAL LOW (ref 8.9–10.3)
Chloride: 104 mmol/L (ref 98–111)
Creatinine, Ser: 1.18 mg/dL — ABNORMAL HIGH (ref 0.44–1.00)
GFR, Estimated: 54 mL/min — ABNORMAL LOW (ref >60)
Glucose, Bld: 218 mg/dL — ABNORMAL HIGH (ref 70–99)
Potassium: 4.1 mmol/L (ref 3.5–5.1)
Sodium: 141 mmol/L (ref 135–145)
Total Bilirubin: 0.5 mg/dL (ref 0.3–1.2)
Total Protein: 5.6 g/dL — ABNORMAL LOW (ref 6.5–8.1)

## 2022-03-15 LAB — GLUCOSE, CAPILLARY
Glucose-Capillary: 202 mg/dL — ABNORMAL HIGH (ref 70–99)
Glucose-Capillary: 260 mg/dL — ABNORMAL HIGH (ref 70–99)

## 2022-03-15 SURGERY — LAPAROSCOPIC CHOLECYSTECTOMY WITH INTRAOPERATIVE CHOLANGIOGRAM
Anesthesia: General | Site: Abdomen

## 2022-03-15 MED ORDER — SEVOFLURANE IN SOLN
RESPIRATORY_TRACT | Status: AC
  Filled 2022-03-15: qty 250

## 2022-03-15 MED ORDER — PROPOFOL 10 MG/ML IV BOLUS
INTRAVENOUS | Status: DC | PRN
  Administered 2022-03-15: 200 mg via INTRAVENOUS

## 2022-03-15 MED ORDER — KETOROLAC TROMETHAMINE 30 MG/ML IJ SOLN
INTRAMUSCULAR | Status: DC | PRN
  Administered 2022-03-15: 30 mg via INTRAVENOUS

## 2022-03-15 MED ORDER — MIDAZOLAM HCL 2 MG/2ML IJ SOLN
INTRAMUSCULAR | Status: DC | PRN
  Administered 2022-03-15: 2 mg via INTRAVENOUS

## 2022-03-15 MED ORDER — BUPIVACAINE HCL (PF) 0.5 % IJ SOLN
INTRAMUSCULAR | Status: DC | PRN
  Administered 2022-03-15: 14 mL

## 2022-03-15 MED ORDER — ORAL CARE MOUTH RINSE: 15.0000 mL | Freq: Once | OROMUCOSAL | Status: AC

## 2022-03-15 MED ORDER — LACTATED RINGERS IV SOLN
INTRAVENOUS | Status: DC
  Administered 2022-03-15: 1000 mL via INTRAVENOUS

## 2022-03-15 MED ORDER — SODIUM CHLORIDE (PF) 0.9 % IJ SOLN
INTRAMUSCULAR | Status: DC | PRN
  Administered 2022-03-15: 40 mL

## 2022-03-15 MED ORDER — GLUCAGON HCL RDNA (DIAGNOSTIC) 1 MG IJ SOLR
INTRAMUSCULAR | Status: DC | PRN
  Administered 2022-03-15: .5 mg via INTRAVENOUS

## 2022-03-15 MED ORDER — MIDAZOLAM HCL 2 MG/2ML IJ SOLN
INTRAMUSCULAR | Status: AC
  Filled 2022-03-15: qty 2

## 2022-03-15 MED ORDER — OXYCODONE HCL 5 MG PO TABS: 5.0000 mg | ORAL_TABLET | ORAL | 0 refills | Status: DC | PRN

## 2022-03-15 MED ORDER — ROCURONIUM BROMIDE 10 MG/ML (PF) SYRINGE
PREFILLED_SYRINGE | INTRAVENOUS | Status: DC | PRN
  Administered 2022-03-15: 10 mg via INTRAVENOUS
  Administered 2022-03-15: 50 mg via INTRAVENOUS
  Administered 2022-03-15 (×2): 10 mg via INTRAVENOUS

## 2022-03-15 MED ORDER — KETOROLAC TROMETHAMINE 30 MG/ML IJ SOLN
INTRAMUSCULAR | Status: AC
  Filled 2022-03-15: qty 1

## 2022-03-15 MED ORDER — SCOPOLAMINE 1 MG/3DAYS TD PT72
MEDICATED_PATCH | TRANSDERMAL | Status: DC
Start: 2022-03-15 — End: 2022-03-15
  Filled 2022-03-15: qty 1

## 2022-03-15 MED ORDER — FENTANYL CITRATE (PF) 100 MCG/2ML IJ SOLN
INTRAMUSCULAR | Status: AC
Start: 2022-03-15 — End: ?
  Filled 2022-03-15: qty 2

## 2022-03-15 MED ORDER — LIDOCAINE 2% (20 MG/ML) 5 ML SYRINGE
INTRAMUSCULAR | Status: DC | PRN
  Administered 2022-03-15: 100 mg via INTRAVENOUS

## 2022-03-15 MED ORDER — ROCURONIUM BROMIDE 10 MG/ML (PF) SYRINGE
PREFILLED_SYRINGE | INTRAVENOUS | Status: AC
  Filled 2022-03-15: qty 10

## 2022-03-15 MED ORDER — SUCCINYLCHOLINE CHLORIDE 200 MG/10ML IV SOSY
PREFILLED_SYRINGE | INTRAVENOUS | Status: DC | PRN
Start: 2022-03-15 — End: 2022-03-15
  Administered 2022-03-15: 100 mg via INTRAVENOUS

## 2022-03-15 MED ORDER — CHLORHEXIDINE GLUCONATE CLOTH 2 % EX PADS: 6.0000 | MEDICATED_PAD | Freq: Once | CUTANEOUS | Status: DC

## 2022-03-15 MED ORDER — 0.9 % SODIUM CHLORIDE (POUR BTL) OPTIME
TOPICAL | Status: DC | PRN
  Administered 2022-03-15: 1000 mL

## 2022-03-15 MED ORDER — SCOPOLAMINE 1 MG/3DAYS TD PT72
1.0000 | MEDICATED_PATCH | Freq: Once | TRANSDERMAL | Status: DC
  Administered 2022-03-15: 1.5 mg via TRANSDERMAL

## 2022-03-15 MED ORDER — HEMOSTATIC AGENTS (NO CHARGE) OPTIME
TOPICAL | Status: DC | PRN
  Administered 2022-03-15: 1 via TOPICAL

## 2022-03-15 MED ORDER — DEXAMETHASONE SODIUM PHOSPHATE 10 MG/ML IJ SOLN
INTRAMUSCULAR | Status: AC
Start: 2022-03-15 — End: ?
  Filled 2022-03-15: qty 1

## 2022-03-15 MED ORDER — LIDOCAINE HCL (PF) 2 % IJ SOLN
INTRAMUSCULAR | Status: AC
Start: 2022-03-15 — End: ?
  Filled 2022-03-15: qty 5

## 2022-03-15 MED ORDER — FENTANYL CITRATE PF 50 MCG/ML IJ SOSY: 25.0000 ug | PREFILLED_SYRINGE | INTRAMUSCULAR | Status: DC | PRN

## 2022-03-15 MED ORDER — BUPIVACAINE HCL (PF) 0.5 % IJ SOLN
INTRAMUSCULAR | Status: AC
  Filled 2022-03-15: qty 30

## 2022-03-15 MED ORDER — GLUCAGON HCL RDNA (DIAGNOSTIC) 1 MG IJ SOLR
INTRAMUSCULAR | Status: AC
  Filled 2022-03-15: qty 1

## 2022-03-15 MED ORDER — ONDANSETRON HCL 4 MG/2ML IJ SOLN
INTRAMUSCULAR | Status: DC | PRN
  Administered 2022-03-15: 4 mg via INTRAVENOUS

## 2022-03-15 MED ORDER — CHLORHEXIDINE GLUCONATE 0.12 % MT SOLN
15.0000 mL | Freq: Once | OROMUCOSAL | Status: AC
  Administered 2022-03-15: 15 mL via OROMUCOSAL

## 2022-03-15 MED ORDER — SUGAMMADEX SODIUM 200 MG/2ML IV SOLN
INTRAVENOUS | Status: DC | PRN
  Administered 2022-03-15: 200 mg via INTRAVENOUS

## 2022-03-15 MED ORDER — FENTANYL CITRATE (PF) 250 MCG/5ML IJ SOLN
INTRAMUSCULAR | Status: AC
  Filled 2022-03-15: qty 5

## 2022-03-15 MED ORDER — DEXAMETHASONE SODIUM PHOSPHATE 4 MG/ML IJ SOLN
INTRAMUSCULAR | Status: DC | PRN
  Administered 2022-03-15: 5 mg via INTRAVENOUS

## 2022-03-15 MED ORDER — ONDANSETRON HCL 4 MG/2ML IJ SOLN: 4.0000 mg | Freq: Once | INTRAMUSCULAR | Status: DC | PRN

## 2022-03-15 MED ORDER — FENTANYL CITRATE (PF) 250 MCG/5ML IJ SOLN
INTRAMUSCULAR | Status: DC | PRN
  Administered 2022-03-15 (×2): 50 ug via INTRAVENOUS
  Administered 2022-03-15: 100 ug via INTRAVENOUS
  Administered 2022-03-15: 50 ug via INTRAVENOUS

## 2022-03-15 MED ORDER — ONDANSETRON HCL 4 MG/2ML IJ SOLN
INTRAMUSCULAR | Status: AC
  Filled 2022-03-15: qty 2

## 2022-03-15 MED ORDER — ONDANSETRON HCL 4 MG PO TABS: 4.0000 mg | ORAL_TABLET | Freq: Three times a day (TID) | ORAL | 1 refills | Status: DC | PRN

## 2022-03-15 MED ORDER — PROPOFOL 10 MG/ML IV BOLUS
INTRAVENOUS | Status: AC
  Filled 2022-03-15: qty 20

## 2022-03-15 MED ORDER — CIPROFLOXACIN IN D5W 400 MG/200ML IV SOLN
400.0000 mg | INTRAVENOUS | Status: AC
  Administered 2022-03-15: 400 mg via INTRAVENOUS
  Filled 2022-03-15: qty 200

## 2022-03-15 SURGICAL SUPPLY — 47 items
ADH SKN CLS APL DERMABOND .7 (GAUZE/BANDAGES/DRESSINGS) ×1
APL PRP STRL LF DISP 70% ISPRP (MISCELLANEOUS) ×1
APPLIER CLIP ROT 10 11.4 M/L (STAPLE) ×2
APR CLP MED LRG 11.4X10 (STAPLE) ×1
BAG DECANTER FOR FLEXI CONT (MISCELLANEOUS) ×2 IMPLANT
BAG RETRIEVAL 10 (BASKET) ×1
BLADE SURG 15 STRL LF DISP TIS (BLADE) ×1 IMPLANT
BLADE SURG 15 STRL SS (BLADE) ×2
CATH CHOLANGIOGRAM 4.5FR (CATHETERS) ×1 IMPLANT
CHLORAPREP W/TINT 26 (MISCELLANEOUS) ×2 IMPLANT
CLIP APPLIE ROT 10 11.4 M/L (STAPLE) ×1 IMPLANT
CLOTH BEACON ORANGE TIMEOUT ST (SAFETY) ×2 IMPLANT
COVER LIGHT HANDLE STERIS (MISCELLANEOUS) ×4 IMPLANT
DECANTER SPIKE VIAL GLASS SM (MISCELLANEOUS) ×3 IMPLANT
DERMABOND ADVANCED (GAUZE/BANDAGES/DRESSINGS) ×1
DERMABOND ADVANCED .7 DNX12 (GAUZE/BANDAGES/DRESSINGS) ×1 IMPLANT
DRAPE C-ARM FOLDED MOBILE STRL (DRAPES) ×2 IMPLANT
ELECT REM PT RETURN 9FT ADLT (ELECTROSURGICAL) ×2
ELECTRODE REM PT RTRN 9FT ADLT (ELECTROSURGICAL) ×1 IMPLANT
GLOVE SURG ENC MOIS LTX SZ6.5 (GLOVE) ×2 IMPLANT
GLOVE SURG POLYISO LF SZ7.5 (GLOVE) ×4 IMPLANT
GLOVE SURG UNDER POLY LF SZ7 (GLOVE) ×8 IMPLANT
GOWN STRL REUS W/TWL LRG LVL3 (GOWN DISPOSABLE) ×6 IMPLANT
HEMOSTAT SNOW SURGICEL 2X4 (HEMOSTASIS) ×2 IMPLANT
INST SET LAPROSCOPIC AP (KITS) ×2 IMPLANT
KIT TURNOVER KIT A (KITS) ×2 IMPLANT
MANIFOLD NEPTUNE II (INSTRUMENTS) ×2 IMPLANT
NEEDLE INSUFFLATION 120MM (ENDOMECHANICALS) ×2 IMPLANT
NS IRRIG 1000ML POUR BTL (IV SOLUTION) ×2 IMPLANT
PACK LAP CHOLE LZT030E (CUSTOM PROCEDURE TRAY) ×2 IMPLANT
PAD ARMBOARD 7.5X6 YLW CONV (MISCELLANEOUS) ×2 IMPLANT
PENCIL HANDSWITCHING (ELECTRODE) ×1 IMPLANT
SET BASIN LINEN APH (SET/KITS/TRAYS/PACK) ×2 IMPLANT
SET TUBE SMOKE EVAC HIGH FLOW (TUBING) ×2 IMPLANT
SLEEVE ENDOPATH XCEL 5M (ENDOMECHANICALS) ×2 IMPLANT
SUT MNCRL AB 4-0 PS2 18 (SUTURE) ×4 IMPLANT
SUT VICRYL 0 UR6 27IN ABS (SUTURE) ×2 IMPLANT
SYR 20ML LL LF (SYRINGE) ×2 IMPLANT
SYR 30ML LL (SYRINGE) ×2 IMPLANT
SYR CONTROL 10ML LL (SYRINGE) ×2 IMPLANT
SYS BAG RETRIEVAL 10MM (BASKET) ×1
SYSTEM BAG RETRIEVAL 10MM (BASKET) ×1 IMPLANT
TROCAR ENDO BLADELESS 11MM (ENDOMECHANICALS) ×2 IMPLANT
TROCAR XCEL NON-BLD 5MMX100MML (ENDOMECHANICALS) ×2 IMPLANT
TROCAR XCEL UNIV SLVE 11M 100M (ENDOMECHANICALS) ×2 IMPLANT
TUBE CONNECTING 12X1/4 (SUCTIONS) ×2 IMPLANT
WARMER LAPAROSCOPE (MISCELLANEOUS) ×2 IMPLANT

## 2022-03-15 NOTE — Discharge Instructions (Signed)
Discharge Laparoscopic Surgery Instructions:  Common Complaints: Right shoulder pain is common after laparoscopic surgery. This is secondary to the gas used in the surgery being trapped under the diaphragm.  Walk to help your body absorb the gas. This will improve in a few days. Pain at the port sites are common, especially the larger port sites. This will improve with time.  Some nausea is common and poor appetite. The main goal is to stay hydrated the first few days after surgery.   Diet/ Activity: Diet as tolerated. You may not have an appetite, but it is important to stay hydrated. Drink 64 ounces of water a day. Your appetite will return with time.  Shower per your regular routine daily.  Do not take hot showers. Take warm showers that are less than 10 minutes. Rest and listen to your body, but do not remain in bed all day.  Walk everyday for at least 15-20 minutes. Deep cough and move around every 1-2 hours in the first few days after surgery.  Do not lift > 10 lbs, perform excessive bending, pushing, pulling, squatting for 1-2 weeks after surgery.  Do not pick at the dermabond glue on your incision sites.  This glue film will remain in place for 1-2 weeks and will start to peel off.  Do not place lotions or balms on your incision unless instructed to specifically by Dr. Trevone Prestwood.   Pain Expectations and Narcotics: -After surgery you will have pain associated with your incisions and this is normal. The pain is muscular and nerve pain, and will get better with time. -You are encouraged and expected to take non narcotic medications like tylenol and ibuprofen (when able) to treat pain as multiple modalities can aid with pain treatment. -Narcotics are only used when pain is severe or there is breakthrough pain. -You are not expected to have a pain score of 0 after surgery, as we cannot prevent pain. A pain score of 3-4 that allows you to be functional, move, walk, and tolerate some activity is  the goal. The pain will continue to improve over the days after surgery and is dependent on your surgery. -Due to [place] law, we are only able to give a certain amount of pain medication to treat post operative pain, and we only give additional narcotics on a patient by patient basis.  -For most laparoscopic surgery, studies have shown that the majority of patients only need 10-15 narcotic pills, and for open surgeries most patients only need 15-20.   -Having appropriate expectations of pain and knowledge of pain management with non narcotics is important as we do not want anyone to become addicted to narcotic pain medication.  -Using ice packs in the first 48 hours and heating pads after 48 hours, wearing an abdominal binder (when recommended), and using over the counter medications are all ways to help with pain management.   -Simple acts like meditation and mindfulness practices after surgery can also help with pain control and research has proven the benefit of these practices.  Medication: Take tylenol and ibuprofen as needed for pain control, alternating every 4-6 hours.  Example:  Tylenol 1000mg @ 6am, 12noon, 6pm, 12midnight (Do not exceed 4000mg of tylenol a day). Ibuprofen 800mg @ 9am, 3pm, 9pm, 3am (Do not exceed 3600mg of ibuprofen a day).  Take Roxicodone for breakthrough pain every 4 hours.  Take Colace for constipation related to narcotic pain medication. If you do not have a bowel movement in 2 days, take Miralax   over the counter.  Drink plenty of water to also prevent constipation.   Contact Information: If you have questions or concerns, please call our office, [phone], Monday- Thursday 8AM-5PM and Friday 8AM-12Noon.  If it is after hours or on the weekend, please call [facility] Main Number, [phone], [phone], and ask to speak to the surgeon on call for Dr. Ariz Terrones at [place].   

## 2022-03-15 NOTE — Anesthesia Procedure Notes (Signed)
Procedure Name: Intubation ?Date/Time: 03/15/2022 7:41 AM ?Performed by: Orlie Dakin, CRNA ?Pre-anesthesia Checklist: Emergency Drugs available, Patient identified, Suction available and Patient being monitored ?Patient Re-evaluated:Patient Re-evaluated prior to induction ?Oxygen Delivery Method: Circle system utilized ?Preoxygenation: Pre-oxygenation with 100% oxygen ?Induction Type: IV induction, Rapid sequence and Cricoid Pressure applied ?Laryngoscope Size: Glidescope and 3 ?Grade View: Grade I ?Tube type: Oral ?Tube size: 7.0 mm ?Number of attempts: 1 ?Airway Equipment and Method: Rigid stylet and Video-laryngoscopy ?Placement Confirmation: ETT inserted through vocal cords under direct vision, positive ETCO2 and breath sounds checked- equal and bilateral ?Secured at: 22 cm ?Tube secured with: Tape ?Dental Injury: Teeth and Oropharynx as per pre-operative assessment  ?Comments: Glidescope electively used due to C/O nausea in Preop.  4x4s bite block used. ? ? ? ? ?

## 2022-03-15 NOTE — Progress Notes (Signed)
[facility] ? ?Updated husband, cholangiogram difficult but successful.  ? ?Pain meds sent in. ? ?Live test reassuring from today preop.  ? ? Latest Reference Range & Units 03/15/22 07:31  ?Sodium 135 - 145 mmol/L 141  ?Potassium 3.5 - 5.1 mmol/L 4.1  ?Chloride 98 - 111 mmol/L 104  ?CO2 22 - 32 mmol/L 27  ?Glucose 70 - 99 mg/dL 218 (H)  ?BUN 6 - 20 mg/dL 16  ?Creatinine 0.44 - 1.00 mg/dL 1.18 (H)  ?Calcium 8.9 - 10.3 mg/dL 8.8 (L)  ?Anion gap 5 - 15  10  ?Alkaline Phosphatase 38 - 126 U/L 66  ?Albumin 3.5 - 5.0 g/dL 3.4 (L)  ?AST 15 - 41 U/L 22  ?ALT 0 - 44 U/L 21  ?Total Protein 6.5 - 8.1 g/dL 5.6 (L)  ?Total Bilirubin 0.3 - 1.2 mg/dL 0.5  ?GFR, Estimated >60 mL/min 54 (L)  ? ?Curlene Labrum, MD ?[facility] ?[place], [place] [address] ?[phone] (office) ? ? ?

## 2022-03-15 NOTE — Progress Notes (Signed)
Dr Briant Cedar notified of 260 blood sugar. No new orders given. ?

## 2022-03-15 NOTE — Progress Notes (Signed)
Tory Emerald updated on surgical progress.  ?

## 2022-03-15 NOTE — Anesthesia Postprocedure Evaluation (Signed)
Anesthesia Post Note ? ?Patient: Sydney Taylor ? ?Procedure(s) Performed: LAPAROSCOPIC CHOLECYSTECTOMY WITH INTRAOPERATIVE CHOLANGIOGRAM (Abdomen) ?LIVER BIOPSY (Abdomen) ? ?Patient location during evaluation: Phase II ?Anesthesia Type: General ?Level of consciousness: awake ?Pain management: pain level controlled ?Vital Signs Assessment: post-procedure vital signs reviewed and stable ?Respiratory status: spontaneous breathing and respiratory function stable ?Cardiovascular status: blood pressure returned to baseline and stable ?Postop Assessment: no headache and no apparent nausea or vomiting ?Anesthetic complications: no ?Comments: Late entry ? ? ?No notable events documented. ? ? ?Last Vitals:  ?Vitals:  ? 03/15/22 1030 03/15/22 1053  ?BP: 129/65 106/85  ?Pulse: 93 90  ?Resp: 17 15  ?Temp:  37 ?C  ?SpO2: 93% 90%  ?  ?Last Pain:  ?Vitals:  ? 03/15/22 1053  ?TempSrc: Oral  ?PainSc: 4   ? ? ?  ?  ?  ?  ?  ?  ? ?Louann Sjogren ? ? ? ? ?

## 2022-03-15 NOTE — Anesthesia Preprocedure Evaluation (Signed)
Anesthesia Evaluation  ?Patient identified by MRN, date of birth, ID band ?Patient awake ? ? ? ?Reviewed: ?Allergy & Precautions, H&P , NPO status , Patient's Chart, lab work & pertinent test results, reviewed documented beta blocker date and time  ? ?Airway ?Mallampati: II ? ?TM Distance: >3 FB ?Neck ROM: full ? ? ? Dental ?no notable dental hx. ? ?  ?Pulmonary ?neg pulmonary ROS, former smoker,  ?  ?Pulmonary exam normal ?breath sounds clear to auscultation ? ? ? ? ? ? Cardiovascular ?Exercise Tolerance: Good ?hypertension, negative cardio ROS ? ? ?Rhythm:regular Rate:Normal ? ? ?  ?Neuro/Psych ? Headaches, PSYCHIATRIC DISORDERS Anxiety Depression Bipolar Disorder  Neuromuscular disease   ? GI/Hepatic ?Neg liver ROS, GERD  Medicated,  ?Endo/Other  ?diabetes, Type 2Morbid obesity ? Renal/GU ?negative Renal ROS  ?negative genitourinary ?  ?Musculoskeletal ? ? Abdominal ?  ?Peds ? Hematology ? ?(+) Blood dyscrasia, anemia ,   ?Anesthesia Other Findings ? ? Reproductive/Obstetrics ?negative OB ROS ? ?  ? ? ? ? ? ? ? ? ? ? ? ? ? ?  ?  ? ? ? ? ? ? ? ? ?Anesthesia Physical ?Anesthesia Plan ? ?ASA: 3 ? ?Anesthesia Plan: General and General ETT  ? ?Post-op Pain Management:   ? ?Induction:  ? ?PONV Risk Score and Plan: Ondansetron and Scopolamine patch - Pre-op ? ?Airway Management Planned:  ? ?Additional Equipment:  ? ?Intra-op Plan:  ? ?Post-operative Plan:  ? ?Informed Consent: I have reviewed the patients History and Physical, chart, labs and discussed the procedure including the risks, benefits and alternatives for the proposed anesthesia with the patient or authorized representative who has indicated his/her understanding and acceptance.  ? ? ? ?Dental Advisory Given ? ?Plan Discussed with: CRNA ? ?Anesthesia Plan Comments:   ? ? ? ? ? ? ?Anesthesia Quick Evaluation ? ?

## 2022-03-15 NOTE — Transfer of Care (Signed)
Immediate Anesthesia Transfer of Care Note ? ?Patient: Sydney Taylor ? ?Procedure(s) Performed: LAPAROSCOPIC CHOLECYSTECTOMY WITH INTRAOPERATIVE CHOLANGIOGRAM (Abdomen) ?LIVER BIOPSY (Abdomen) ? ?Patient Location: PACU ? ?Anesthesia Type:General ? ?Level of Consciousness: drowsy ? ?Airway & Oxygen Therapy: Patient Spontanous Breathing and Patient connected to face mask oxygen ? ?Post-op Assessment: Report given to RN and Post -op Vital signs reviewed and stable ? ?Post vital signs: Reviewed and stable ? ?Last Vitals:  ?Vitals Value Taken Time  ?BP 132/77 03/15/22 1001  ?Temp    ?Pulse 90 03/15/22 1004  ?Resp 17 03/15/22 1004  ?SpO2 96 % 03/15/22 1004  ?Vitals shown include unvalidated device data. ? ?Last Pain:  ?Vitals:  ? 03/15/22 0642  ?TempSrc: Oral  ?PainSc: 3   ?   ? ?Patients Stated Pain Goal: 7 (03/15/22 0349) ? ?Complications: No notable events documented. ?

## 2022-03-15 NOTE — Interval H&P Note (Signed)
History and Physical Interval Note: ? ?03/15/2022 ?7:29 AM ? ?RYDER CHESMORE  has presented today for surgery, with the diagnosis of Gallstones ?Pancreatitis.  The various methods of treatment have been discussed with the patient and family. After consideration of risks, benefits and other options for treatment, the patient has consented to  Procedure(s): ?LAPAROSCOPIC CHOLECYSTECTOMY WITH INTRAOPERATIVE CHOLANGIOGRAM (N/A) ?LIVER BIOPSY (N/A) as a surgical intervention.  The patient's history has been reviewed, patient examined, no change in status, stable for surgery.  I have reviewed the patient's chart and labs.  Questions were answered to the patient's satisfaction.   ? ?Talked about liver biopsy, will also do some liver test since she is correct the ED did not do any when she was there in Feb.  ?Virl Cagey ? ? ?

## 2022-03-15 NOTE — Op Note (Addendum)
Operative Note ?  ?Preoperative Diagnosis: Gallstone pancreatitis, fatty liver  ?  ?Postoperative Diagnosis: Same ?  ?Procedure(s) Performed: Laparoscopic cholecystectomy, intraoperative cholangiogram, wedge liver biopsy  ?  ?Surgeon: Lanell Matar. Constance Haw, MD ?  ?Assistants: No qualified resident was available ?  ?Anesthesia: General endotracheal ?  ?Anesthesiologist: Louann Sjogren, MD  ?  ?Specimens: Gallbladder  ?  ?Estimated Blood Loss: Minimal  ?  ?Blood Replacement: None  ?  ?Complications: None  ? ?Fluorsoscopy time: 47 seconds ?  ?Operative Findings: Distended gallbladder with spiraled cystic duct impacted with stones, cholangiogram with patent hepatic ducts and common duct, no obvious stones  ?  ?Procedure: The patient was taken to the operating room and placed supine. General endotracheal anesthesia was induced. Intravenous antibiotics were administered per protocol. An orogastric tube positioned to decompress the stomach. The abdomen was prepared and draped in the usual sterile fashion.  ?  ?A supraumbilical incision was made and a Veress technique was utilized to achieve pneumoperitoneum to 15 mmHg with carbon dioxide. A 11 mm optiview port was placed through the supraumbilical region, and a 10 mm 0-degree operative laparoscope was introduced. The area underlying the trocar and Veress needle were inspected and without evidence of injury.  Remaining trocars were placed under direct vision. Two 5 mm ports were placed in the right abdomen, between the anterior axillary and midclavicular line.  A final 11 mm port was placed through the mid-epigastrium, near the falciform ligament.  ?  ?The gallbladder fundus was elevated cephalad and the infundibulum was retracted to the patient's right. The gallbladder/cystic duct junction was skeletonized. The cystic artery noted in the triangle of Calot and was also skeletonized.  We then continued liberal medial and lateral dissection until the critical view of safety was  achieved.  ?  ?The cystic duct was very dilated and full of stones. The cystic artery were doubly and divided. The cystic duct was milked to get stones into the gallbladder. A clip was placed on the cystic duct toward the gallbladder. A ductotomy was performed.  More stones were milked out. Over 10 attempts at threading the cholangiogram catheter were performed and flushing the duct with saline. This resulted in more stones being extracted. At least 20 + gravel like stones were extruded.  We were finally able to thread the catheter and clap the cholangiogram clap, getting good flow of saline after a clamped the top of the duct with a right angle. A cholangiogram run was performed showing a spiraling cystic duct and a patent common duct and hepatic ducts. I was worried about a stone at the ampulla and glucagon was given and the duct was flushed. A second run was performed and it appeared that the ducts were clear. The cholangiogram was completed. The camera was reintroduced and the clamp was removed. 4 clips were placed on the cystic duct to ensure it was closed and it was divided.   The gallbladder was then dissected from the liver bed with electrocautery.  A wedge liver biopsy was obtained with cautery on the edge of the liver. Both specimen were placed in an Endopouch and was retrieved through the epigastric site. ?  ?Final inspection revealed acceptable hemostasis. Surgical SNOW was placed in the gallbladder bed.  Trocars were removed and pneumoperitoneum was released.  0 Vicryl fascial sutures were used to close the umbilical port site and the epigastric site was over the liver and tiny. Skin incisions were closed with 4-0 Monocryl subcuticular sutures and Dermabond. The  patient was awakened from anesthesia and extubated without complication.  ?  ?Curlene Labrum, MD ?[facility] ?[place], [place] [address] ?[phone] (office) ?  ?

## 2022-03-17 ENCOUNTER — Other Ambulatory Visit: Payer: Self-pay | Admitting: Family Medicine

## 2022-03-18 ENCOUNTER — Encounter: Payer: Self-pay | Admitting: Internal Medicine

## 2022-03-18 ENCOUNTER — Telehealth: Payer: Self-pay | Admitting: *Deleted

## 2022-03-18 LAB — SURGICAL PATHOLOGY

## 2022-03-18 NOTE — Telephone Encounter (Signed)
Received call from patient [phone]~ telephone.  ? ?Surgical Date: 03/15/2022. ?Procedure: Lap Chole ? ?Concerns: Patient reports hard, swollen area that is tender to touch above naval incision. States that area is more tender than other incisions.  ? ?Denies redness, drainage, warmth to touch at incision to naval. Denies fever/ chills, nausea, vomiting, difficulty with BM or changes in appetite.  ? ?Advised that area sounds like seroma. Appointment scheduled with Dr. Constance Haw on 203/21/2023 for evaluation.  ?

## 2022-03-19 ENCOUNTER — Other Ambulatory Visit: Payer: Self-pay | Admitting: *Deleted

## 2022-03-19 ENCOUNTER — Encounter: Payer: Self-pay | Admitting: General Surgery

## 2022-03-19 ENCOUNTER — Other Ambulatory Visit: Payer: Self-pay

## 2022-03-19 ENCOUNTER — Ambulatory Visit (INDEPENDENT_AMBULATORY_CARE_PROVIDER_SITE_OTHER): Payer: Medicare Other | Admitting: General Surgery

## 2022-03-19 VITALS — BP 117/79 | HR 81 | Temp 97.6°F | Resp 16 | Ht 69.0 in | Wt 262.0 lb

## 2022-03-19 DIAGNOSIS — K851 Biliary acute pancreatitis without necrosis or infection: Secondary | ICD-10-CM

## 2022-03-19 MED ORDER — VITAMIN D3 125 MCG (5000 UT) PO TABS
2.0000 | ORAL_TABLET | Freq: Every day | ORAL | 1 refills | Status: DC
Start: 2022-03-19 — End: 2022-06-28

## 2022-03-19 NOTE — Progress Notes (Signed)
[facility] ? ?Feeling bulge and pain at the umbilicus. Has been there since Saturday. Has some bruising of the sites. ? ?BP 117/79   Pulse 81   Temp 97.6 ?F (36.4 ?C) (Other (Comment))   Resp 16   Ht '5\' 9"'$  (1.753 m)   Wt 262 lb (118.8 kg)   LMP  (LMP Unknown)   SpO2 95%   BMI 38.69 kg/m?  ?Bruising of port sites, hematoma. Seroma likely at the umbilicus ? ?Patient s/p laparoscopic cholecystectomy. Pathology all benign, forwarded to GI.  ?Likely seroma/ hematoma ?Could be a hernia at the port site but less likely ?Will see next week and do imaging with [place] if still not improved ? ?Curlene Labrum, MD ?[facility] ?[place], [place] [address] ?[phone] (office) ? ? ?Future Appointments  ?Date Time Provider [place]  ?03/21/2022 10:45 AM RPC-CCM CARE MGR RPC-RPC RPC  ?03/28/2022  2:15 PM Virl Cagey, MD RS-RS None  ?03/29/2022 11:20 AM Virl Cagey, MD RS-RS None  ?04/25/2022  1:45 PM Renato Shin, MD LBPC-LBENDO None  ?06/03/2022 11:00 AM Lindell Spar, MD RPC-RPC RPC  ? ? ?

## 2022-03-19 NOTE — Patient Instructions (Signed)
Seroma ?A seroma is a collection of fluid on the body that looks like swelling or a mass. Seromas form where tissue has been injured or cut. Seromas vary in size. Some are small and painless. Others may become large and cause pain or discomfort. Many seromas go away on their own as the fluid is naturally absorbed by the body, and some seromas need to be drained. ?What are the causes? ?Seromas form as the result of damage to tissue or the removal of tissue. This tissue damage may happen during surgery or because of an injury or trauma. When tissue is disrupted or removed, empty space is created. The body's natural defense system (immune system) causes fluid to enter the empty space and form a seroma. ?What are the signs or symptoms? ?Symptoms of this condition include: ?Swelling at the site of a surgical incision or an injury. ?Drainage of clear fluid at the surgery or injury site. ?Discomfort or pain. ?How is this diagnosed? ?This condition is diagnosed based on: ?Your symptoms. ?Your medical history. ?A physical exam. During the exam, your health care provider will press on the seroma. ?You may also have tests, including: ?Blood tests. ?Imaging tests, such as an ultrasound or a CT scan. ?How is this treated? ?Some seromas go away on their own. Your health care provider may monitor you to make sure the seroma does not cause any problems. If your seroma does not go away on its own, treatment may include: ?Using a needle to drain the fluid from the seroma (needle aspiration). ?Inserting a small, thin tube (catheter) to drain the fluid. ?Applying a bandage (dressing), such as an elastic bandage or binder. ?Taking antibiotic medicines, if the seroma becomes infected. ?In rare cases, surgery may be done to remove the seroma and repair the area. ?Follow these instructions at home: ? ?If you were prescribed an antibiotic medicine, take it as told by your health care provider. Do not stop using the antibiotic even if you  start to feel better. ?Take over-the-counter and prescription medicines only as told by your health care provider. ?Return to your normal activities as told by your health care provider. Ask your health care provider what activities are safe for you. ?Check your seroma every day for signs of infection. Check for: ?Redness or pain. ?More swelling. ?More fluid. ?Warmth. ?Pus or a bad smell. ?Keep all follow-up visits as told by your health care provider. This is important. ?Contact a health care provider if: ?You have a fever. ?You have redness or pain at the site of the seroma. ?Your seroma is more swollen or is getting bigger. ?You have more fluid coming from the seroma. ?Your seroma feels warm to the touch. ?You have pus or a bad smell coming from the seroma. ?Get help right away if: ?You have a fever along with severe pain, redness at the site, or chills. ?You feel confused or have trouble staying awake. ?You feel like your heart is beating very fast. ?You feel short of breath or are breathing rapidly. ?You have cool, clammy, or sweaty skin. ?Summary ?Seromas can form as a result of injury or surgery on tissue. ?Seromas can cause swelling, drainage of clear fluid, and discomfort or pain at the site of the tissue injury. ?Some seromas go away on their own. Other seromas may need to be drained. ?Check your seroma every day for signs of infection, such as pain, more swelling, more fluid, warmth, or pus or a bad smell. ?This information is not intended  to replace advice given to you by your health care provider. Make sure you discuss any questions you have with your health care provider. ?Document Revised: 11/08/2019 Document Reviewed: 11/08/2019 ?Elsevier Patient Education ? [place]. ? ?

## 2022-03-20 ENCOUNTER — Encounter: Payer: Self-pay | Admitting: Endocrinology

## 2022-03-21 ENCOUNTER — Ambulatory Visit: Payer: Medicare Other | Admitting: *Deleted

## 2022-03-21 DIAGNOSIS — E782 Mixed hyperlipidemia: Secondary | ICD-10-CM

## 2022-03-21 DIAGNOSIS — E1165 Type 2 diabetes mellitus with hyperglycemia: Secondary | ICD-10-CM

## 2022-03-21 NOTE — Chronic Care Management (AMB) (Signed)
?Chronic Care Management  ? ?CCM RN Visit Note ? ?03/21/2022 ?Name: Sydney Taylor MRN: [id number] DOB: 06-25-1966 ? ?Subjective: ?Sydney Taylor is a 56 y.o. year old female who is a primary care patient of Lindell Spar, MD. The care management team was consulted for assistance with disease management and care coordination needs.   ? ?Engaged with patient by telephone for follow up visit in response to provider referral for case management and/or care coordination services.  ? ?Consent to Services:  ?The patient was given information about Chronic Care Management services, agreed to services, and gave verbal consent prior to initiation of services.  Please see initial visit note for detailed documentation.  ? ?Patient agreed to services and verbal consent obtained.  ? ?Assessment: Review of patient past medical history, allergies, medications, health status, including review of consultants reports, laboratory and other test data, was performed as part of comprehensive evaluation and provision of chronic care management services.  ? ?SDOH (Social Determinants of Health) assessments and interventions performed:   ? ?CCM Care Plan ? ?Allergies  ?Allergen Reactions  ? Cephalexin Itching, Swelling and Other (See Comments)  ? ? ?Outpatient Encounter Medications as of 03/21/2022  ?Medication Sig  ? ALPRAZolam (XANAX) 0.5 MG tablet Take 0.25 mg by mouth daily as needed for anxiety.  ? Aspirin-Caffeine (BC FAST PAIN RELIEF PO) Take 1 packet by mouth daily as needed (pain).  ? B-12 Methylcobalamin 1000 MCG TBDP Take 1,000 mcg by mouth daily.  ? blood glucose meter kit and supplies KIT Inject 1 each into the skin daily. Check blood glucose once a day. Diagnosis code: E 11.9.  ? buPROPion ER (WELLBUTRIN SR) 100 MG 12 hr tablet Take 100 mg by mouth daily.  ? calcium carbonate (TUMS - DOSED IN MG ELEMENTAL CALCIUM) 500 MG chewable tablet Chew 1,000 mg by mouth daily as needed for indigestion or heartburn.  ? chlorthalidone  (HYGROTON) 25 MG tablet Take 0.5 tablets (12.5 mg total) by mouth daily.  ? Cholecalciferol (VITAMIN D3) 125 MCG (5000 UT) TABS Take 2 tablets (10,000 Units total) by mouth daily.  ? Continuous Blood Gluc Sensor (DEXCOM G6 SENSOR) MISC by Does not apply route.  ? Continuous Blood Gluc Transmit (DEXCOM G6 TRANSMITTER) MISC by Does not apply route.  ? DULoxetine (CYMBALTA) 30 MG capsule Take 30 mg by mouth daily in the afternoon.  ? DULoxetine (CYMBALTA) 60 MG capsule Take 60 mg by mouth every morning.  ? empagliflozin (JARDIANCE) 10 MG TABS tablet TAKE 1 TABLET(10 MG) BY MOUTH DAILY BEFORE BREAKFAST  ? glucose blood (ACCU-CHEK GUIDE) test strip 1 each by Other route in the morning, at noon, and at bedtime. Use as instructed  ? ibuprofen (ADVIL) 200 MG tablet Take 600 mg by mouth every 6 (six) hours as needed for moderate pain or headache.  ? insulin glargine, 2 Unit Dial, (TOUJEO MAX SOLOSTAR) 300 UNIT/ML Solostar Pen Inject 124 Units into the skin every morning. And pen needles 1/day  ? Insulin Pen Needle (PEN NEEDLES) 32G X 4 MM MISC Check BS 2 x a day  ? KLOR-CON M20 20 MEQ tablet TAKE 2 TABLETS BY MOUTH TWICE A DAY (Patient taking differently: Take 40 mEq by mouth daily.)  ? L-Methylfolate 15 MG TABS Take 15 mg by mouth daily at 6 (six) AM.  ? losartan (COZAAR) 25 MG tablet TAKE 1 TABLET(25 MG) BY MOUTH DAILY  ? metFORMIN (GLUCOPHAGE-XR) 500 MG 24 hr tablet Take 2 tablets (1,000 mg total) by mouth  daily with breakfast. (Patient taking differently: Take 1,000 mg by mouth daily with supper.)  ? metoprolol tartrate (LOPRESSOR) 25 MG tablet Take 1 tablet (25 mg total) by mouth 2 (two) times daily.  ? metroNIDAZOLE (METROGEL) 0.75 % gel Apply 1 application. topically 2 (two) times daily.  ? Multiple Vitamins-Minerals (MULTIVITAMIN WOMEN 50+) TABS Take 1 tablet by mouth daily.  ? ondansetron (ZOFRAN) 4 MG tablet Take 1 tablet (4 mg total) by mouth every 8 (eight) hours as needed.  ? pantoprazole (PROTONIX) 40 MG tablet  Take 1 tablet (40 mg total) by mouth daily.  ? PREDNISOLON-MOXIFLOX-BROMFENAC OP Place 1 drop into the right eye daily.  ? Probiotic Product (PROBIOTIC PO) Take 1 capsule by mouth daily.  ? rosuvastatin (CRESTOR) 10 MG tablet Take 1 tablet (10 mg total) by mouth daily. (Patient taking differently: Take 10 mg by mouth every other day.)  ? SUMAtriptan (IMITREX) 50 MG tablet MAY REPEAT IN 2 HOURS IF HEADACHE PERSISTS OR RECURS. MAXIMUM 2 PER DAY.  ? traZODone (DESYREL) 50 MG tablet Take 12.5 mg by mouth at bedtime.  ? cetirizine (ZYRTEC) 10 MG tablet TAKE 1 TABLET BY MOUTH EVERY DAY  ? oxyCODONE (ROXICODONE) 5 MG immediate release tablet Take 1 tablet (5 mg total) by mouth every 4 (four) hours as needed for severe pain or breakthrough pain. (Patient not taking: Reported on 03/19/2022)  ? ?No facility-administered encounter medications on file as of 03/21/2022.  ? ? ?Patient Active Problem List  ? Diagnosis Date Noted  ? Indigestion 03/05/2022  ? Bloating 03/05/2022  ? Fatty liver 03/05/2022  ? Gallstone pancreatitis 03/04/2022  ? Acute pancreatitis 02/12/2022  ? Migraine with aura 02/01/2022  ? Encounter for general adult medical examination with abnormal findings 02/01/2022  ? Plantar fasciitis 02/01/2022  ? Chest pain 01/14/2022  ? Chronic migraine without aura without status migrainosus, not intractable 10/29/2021  ? Diabetic polyneuropathy associated with type 2 diabetes mellitus ([place]) 09/19/2021  ? History of hormone therapy 09/19/2021  ? Bilateral primary osteoarthritis of knee 04/10/2021  ? Perennial allergic rhinitis 04/06/2021  ? Allergic contact dermatitis 04/06/2021  ? Cholinergic urticaria 04/06/2021  ? Fibromyalgia syndrome 01/12/2021  ? Elevated C-reactive protein (CRP) 01/12/2021  ? Diverticulosis of colon without hemorrhage 01/02/2021  ? Major depressive disorder, single episode, severe ([place]) 07/14/2020  ? GERD (gastroesophageal reflux disease) 05/11/2020  ? Type 2 diabetes mellitus ([place]) 03/27/2020  ?  Bipolar depression ([place]) 12/28/2019  ? Sleep apnea 12/28/2019  ? Tachycardia 10/31/2019  ? Hyperlipidemia 10/30/2015  ? Morbid obesity ([place]) 10/30/2015  ? ? ?Conditions to be addressed/monitored:HLD and DMII ? ?Care Plan : RN Care Manager plan of care  ?Updates made by Kassie Mends, RN since 03/21/2022 12:00 AM  ?  ? ?Problem: No plan of care established for management of chronic disease states- DM2, HLD   ?Priority: High  ?  ? ?Long-Range Goal: Development of plan of care for chronic disease management - DM2, HLD   ?Start Date: 11/08/2021  ?Expected End Date: 09/17/2022  ?Priority: High  ?Note:   ? ?Current Barriers:  ?Knowledge Deficits related to plan of care for management of HLD and DMII  ?Care Coordination needs related to Mental Health Concerns  and Lacks knowledge of community resource: Patient reports she has depression and is followed by psychiatrist and counselor regularly, declines services of CCM LCSW (PHQ9=10).  ?Chronic Disease Management support and education needs related to HLD and DMII- Patient lives with spouse, is mostly independent with ADL, IADL's,  continues to drive, CBG now checked with Dexcom with fasting readings usually 130-160's range but over past few days has been around 200, random readings 240-270 range, pt reports she sent message yesterday to endocrinologist and is awaiting response as to whether medication dosage should be increased. Patient can benefit from education related to ADA/ carbohydrate modified diet.  Patient reports she does not exercise, did receive flu vaccine.  Patient has chronic pain and saw pain specialist in the past but did not feel was helpful, pain varies day to day, takes medication which is helpful.  ?Pt reports she had "gallbladder removed on 03/15/22" and she is recovering well, saw surgeon on 3/21 "for an area near incision that feels hard" and is now looking better, to follow up with surgeon again on 03/28/22, pt verbalizes understanding signs/ symptoms  infection. ? ?RNCM Clinical Goal(s):  ?Patient will verbalize understanding of plan for management of HLD and DMII as evidenced by   ?verbalize basic understanding of HLD and DMII disease process and

## 2022-03-21 NOTE — Patient Instructions (Signed)
Visit Information ? ?Thank you for taking time to visit with me today. Please don't hesitate to contact me if I can be of assistance to you before our next scheduled telephone appointment. ? ?Following are the goals we discussed today:  ?Patient will attend all scheduled provider appointments as evidenced by clinician review of documented attendance to scheduled appointments and patient/caregiver report ?Patient will call provider office for new concerns or questions as evidenced by review of documented incoming telephone call notes and patient report ?Please call [facility] at [phone] for help with choosing the best insurance plan for your needs ?Reviewed checking blood sugar as prescribed and record, take to doctor's visits ?Avoid/ limit saturated/ trans fats- broil or bake foods instead of frying ?You need adequate protein in your diet for healing (meat, chicken, fish, eggs, cheese, nuts) ?Practice portion control ?Get outside daily, try walking for exercise if you are able ?Keep stress under control, practice relaxation ?Follow carbohydrate modified diet/ plate method ?Continue working with your psychiatrist and counselor ?Follow up with endocrinologist for any elevated blood sugars ? ?Our next appointment is by telephone on  05/09/22 at 130 pm ? ?Please call the care guide team at [phone] if you need to cancel or reschedule your appointment.  ? ?If you are experiencing a Mental Health or [place] or need someone to talk to, please call the Suicide and Crisis Lifeline: 988 ?call the [place] National Suicide Prevention Lifeline: 325-658-1174 or TTY: (325)846-5683 TTY (782) 504-4752) to talk to a trained counselor ?call 1-800-273-TALK (toll free, 24 hour hotline) ?go to [facility] [address] [place] [phone]) ?call 911  ? ?Patient verbalizes understanding of instructions and care plan provided today and agrees to view  in [place]. Active MyChart status confirmed with patient.   ? ?Jacqlyn Larsen RNC, BSN ?RN Case Manager ?[place] ?[phone] ? ?

## 2022-03-28 ENCOUNTER — Other Ambulatory Visit: Payer: Self-pay

## 2022-03-28 ENCOUNTER — Encounter: Payer: Self-pay | Admitting: General Surgery

## 2022-03-28 ENCOUNTER — Ambulatory Visit (INDEPENDENT_AMBULATORY_CARE_PROVIDER_SITE_OTHER): Payer: Medicare Other | Admitting: General Surgery

## 2022-03-28 VITALS — BP 128/73 | HR 104 | Temp 98.5°F | Resp 12 | Ht 69.0 in | Wt 262.0 lb

## 2022-03-28 DIAGNOSIS — S301XXD Contusion of abdominal wall, subsequent encounter: Secondary | ICD-10-CM

## 2022-03-28 DIAGNOSIS — K851 Biliary acute pancreatitis without necrosis or infection: Secondary | ICD-10-CM

## 2022-03-28 DIAGNOSIS — R531 Weakness: Secondary | ICD-10-CM

## 2022-03-28 NOTE — Patient Instructions (Signed)
Seroma looking good. Monitor, if worried can always do an Ultrasound to ensure no hernia. ?Will get lab work to look at blood counts, liver test, lipase, thyroid levels to ensure not missing anything.  ? ?Throat pain after a breathing tube should resolve after next 1-2 weeks. If not can refer to ENT.  ?

## 2022-03-28 NOTE — Progress Notes (Signed)
[facility] ? ?Returning to look at her umbilical incision to ensure no hernia and just seroma. It is less sore and smaller. All consistent with a seroma/ hematoma.  ? ?BP 128/73   Pulse (!) 104   Temp 98.5 ?F (36.9 ?C) (Oral)   Resp 12   Ht '5\' 9"'$  (1.753 m)   Wt 262 lb (118.8 kg)   LMP  (LMP Unknown)   SpO2 93%   BMI 38.69 kg/m?  ?Port sites healing, nontender knot that is smaller at umbilicus ? ?Patient overall feeling tired and weak. No fevers, no signs of infection. Eating and having Bms. Also with throat pain post ET.  ? ?Throat pain after a breathing tube should resolve after next 1-2 weeks. If not can refer to ENT.  ?Lab work with CBC, BMP, hepatic panel, Lipase and TSH given the symptoms. Had been on thyroid meds prior and reports those being stopped ? ?Will call with results once back. ? ?Curlene Labrum, MD ?[facility] ?[place],  [address] ?[phone] (office) ? ?

## 2022-03-29 ENCOUNTER — Encounter: Payer: Medicare Other | Admitting: General Surgery

## 2022-03-29 DIAGNOSIS — E782 Mixed hyperlipidemia: Secondary | ICD-10-CM | POA: Diagnosis not present

## 2022-03-29 DIAGNOSIS — E1165 Type 2 diabetes mellitus with hyperglycemia: Secondary | ICD-10-CM | POA: Diagnosis not present

## 2022-03-30 ENCOUNTER — Telehealth (INDEPENDENT_AMBULATORY_CARE_PROVIDER_SITE_OTHER): Payer: Medicare Other | Admitting: General Surgery

## 2022-03-30 DIAGNOSIS — Z09 Encounter for follow-up examination after completed treatment for conditions other than malignant neoplasm: Secondary | ICD-10-CM

## 2022-03-30 LAB — BASIC METABOLIC PANEL
BUN: 18 mg/dL (ref 7–25)
CO2: 27 mmol/L (ref 20–32)
Calcium: 9.4 mg/dL (ref 8.6–10.4)
Chloride: 103 mmol/L (ref 98–110)
Creat: 1.03 mg/dL (ref 0.50–1.03)
Glucose, Bld: 273 mg/dL — ABNORMAL HIGH (ref 65–139)
Potassium: 3.9 mmol/L (ref 3.5–5.3)
Sodium: 139 mmol/L (ref 135–146)

## 2022-03-30 LAB — HEPATIC FUNCTION PANEL
AG Ratio: 1.9 (calc) (ref 1.0–2.5)
ALT: 18 U/L (ref 6–29)
AST: 14 U/L (ref 10–35)
Albumin: 3.9 g/dL (ref 3.6–5.1)
Alkaline phosphatase (APISO): 84 U/L (ref 37–153)
Bilirubin, Direct: 0.1 mg/dL (ref 0.0–0.2)
Globulin: 2.1 g/dL (calc) (ref 1.9–3.7)
Indirect Bilirubin: 0.3 mg/dL (calc) (ref 0.2–1.2)
Total Bilirubin: 0.4 mg/dL (ref 0.2–1.2)
Total Protein: 6 g/dL — ABNORMAL LOW (ref 6.1–8.1)

## 2022-03-30 LAB — CBC WITH DIFFERENTIAL/PLATELET
Absolute Monocytes: 799 cells/uL (ref 200–950)
Basophils Absolute: 78 cells/uL (ref 0–200)
Basophils Relative: 0.7 %
Eosinophils Absolute: 622 cells/uL — ABNORMAL HIGH (ref 15–500)
Eosinophils Relative: 5.6 %
HCT: 41.5 % (ref 35.0–45.0)
Hemoglobin: 13.5 g/dL (ref 11.7–15.5)
Lymphs Abs: 2475 cells/uL (ref 850–3900)
MCH: 29 pg (ref 27.0–33.0)
MCHC: 32.5 g/dL (ref 32.0–36.0)
MCV: 89.2 fL (ref 80.0–100.0)
MPV: 11 fL (ref 7.5–12.5)
Monocytes Relative: 7.2 %
Neutro Abs: 7126 cells/uL (ref 1500–7800)
Neutrophils Relative %: 64.2 %
Platelets: 314 10*3/uL (ref 140–400)
RBC: 4.65 10*6/uL (ref 3.80–5.10)
RDW: 13.4 % (ref 11.0–15.0)
Total Lymphocyte: 22.3 %
WBC: 11.1 10*3/uL — ABNORMAL HIGH (ref 3.8–10.8)

## 2022-03-30 LAB — LIPASE: Lipase: 93 U/L — ABNORMAL HIGH (ref 7–60)

## 2022-03-30 LAB — TSH: TSH: 1.56 mIU/L (ref 0.40–4.50)

## 2022-03-30 NOTE — Telephone Encounter (Signed)
Called patient to review her test results.  All her questions were answered.  I told her to follow-up with Dr. Constance Haw next week should she have any questions.  No need for antibiotic treatment at this point.  She is aware that she is still hyperglycemic and is trying to control that with insulin. ?As this was a part of the global surgical fee, this was not a billable visit.  Total telephone time was greater than 5 minutes. ?

## 2022-03-31 ENCOUNTER — Encounter: Payer: Self-pay | Admitting: Internal Medicine

## 2022-04-01 ENCOUNTER — Encounter: Payer: Self-pay | Admitting: Internal Medicine

## 2022-04-01 NOTE — Progress Notes (Signed)
Arnoldo Morale called patient over weekend about labs. Everything is reassuring. Put in for a phone call with me in a few weeks to ensure she is getting better. If patient has further issues tell her to let us know, but I think this will all improve with time.

## 2022-04-03 NOTE — Telephone Encounter (Signed)
Reviewed with patient  ?Monitor shows rare skip ?Echo in pst normal    ?REcomm:   Hydrate, increase salt some   Stay active   Resistance training to legs     Abdominal binder (spanx) ? ? ?

## 2022-04-09 ENCOUNTER — Telehealth: Payer: Self-pay

## 2022-04-09 NOTE — Telephone Encounter (Signed)
Per Dr. Kelton Pillar patient was advised to add 1 metformin tablet in the morning and continue the 2 at lunch.  ?

## 2022-04-11 ENCOUNTER — Other Ambulatory Visit (INDEPENDENT_AMBULATORY_CARE_PROVIDER_SITE_OTHER): Payer: Self-pay | Admitting: Endocrinology

## 2022-04-11 DIAGNOSIS — E1100 Type 2 diabetes mellitus with hyperosmolarity without nonketotic hyperglycemic-hyperosmolar coma (NKHHC): Secondary | ICD-10-CM

## 2022-04-14 ENCOUNTER — Other Ambulatory Visit: Payer: Self-pay | Admitting: Internal Medicine

## 2022-04-15 ENCOUNTER — Ambulatory Visit (INDEPENDENT_AMBULATORY_CARE_PROVIDER_SITE_OTHER): Payer: Medicare Other | Admitting: General Surgery

## 2022-04-15 DIAGNOSIS — S301XXD Contusion of abdominal wall, subsequent encounter: Secondary | ICD-10-CM

## 2022-04-15 NOTE — Progress Notes (Signed)
[facility] ? ?I am calling the patient for post operative evaluation. This is not a billable encounter as it is under the [place] charges for the surgery.  Two demographic identifiers were obtained.  ? ?She had been having throat pain and was worried about being tired. Lab work was reassuring. She is having elevated BS and is working on that. Her throat pain is better.  Her seroma is decreased in size.  ? ?Will see the patient PRN. She is going to talk to her Endocrinologist about her diabetes.  ? ?Her lipase was still up slightly. She will see Dr. Posey Pronto Wednesday and ask if he can repeat to ensure it is resolved.  If not we can order. She will call.  ? ?Curlene Labrum, MD ?[facility] ?[place], [place] [address] ?[phone] (office)  ?

## 2022-04-17 ENCOUNTER — Encounter: Payer: Self-pay | Admitting: Internal Medicine

## 2022-04-17 ENCOUNTER — Ambulatory Visit (INDEPENDENT_AMBULATORY_CARE_PROVIDER_SITE_OTHER): Payer: Medicare Other | Admitting: Internal Medicine

## 2022-04-17 VITALS — BP 138/82 | HR 110 | Resp 18 | Ht 69.0 in | Wt 264.2 lb

## 2022-04-17 DIAGNOSIS — B360 Pityriasis versicolor: Secondary | ICD-10-CM | POA: Diagnosis not present

## 2022-04-17 DIAGNOSIS — E1165 Type 2 diabetes mellitus with hyperglycemia: Secondary | ICD-10-CM | POA: Diagnosis not present

## 2022-04-17 MED ORDER — FLUCONAZOLE 150 MG PO TABS: 300.0000 mg | ORAL_TABLET | ORAL | 0 refills | Status: DC

## 2022-04-17 NOTE — Progress Notes (Signed)
? ?Acute Office Visit ? ?Subjective:  ? ? Patient ID: Sydney Taylor, female    DOB: [birth year], 56 y.o.   MRN: [id number] ? ?Chief Complaint  ?Patient presents with  ? Rash  ?  Pt has rash mainly on legs one spot on shoulder doesn't itch or hurt just discoloration its not going away pt has had these spots since 03-04-22  ? ? ?HPI ?Patient is in today for c/o multiple skin rash over b/l LE and left axillary region, which have been present for last few weeks. She denies any itching currently. Denies any change in color or shape recently. Denies any insect bite. Denies any recent change in chemicals such as soap, shampoo, lotion, etc. ? ?Past Medical History:  ?Diagnosis Date  ? Anemia   ? Anxiety   ? Arrhythmia   ? Bipolar depression ([place]) 12/28/2019  ? Chronic fatigue   ? Chronic headaches   ? Depression   ? Diabetes mellitus without complication ([place])   ? Fibromyalgia   ? GERD (gastroesophageal reflux disease)   ? High cholesterol   ? HLD (hyperlipidemia)   ? Hypertension   ? Insect bite   ? Migraine   ? Prediabetes   ? Sleep apnea   ? Urinary incontinence   ? ? ?Past Surgical History:  ?Procedure Laterality Date  ? CHOLECYSTECTOMY N/A 03/15/2022  ? Procedure: LAPAROSCOPIC CHOLECYSTECTOMY WITH INTRAOPERATIVE CHOLANGIOGRAM;  Surgeon: Virl Cagey, MD;  Location: [facility];  Service: General;  Laterality: N/A;  ? COLONOSCOPY  2017  ? per patient normal and needs next one in 2027 (Dr. Posey Pronto)  ? ESOPHAGOGASTRODUODENOSCOPY N/A 05/12/2020  ? Procedure: ESOPHAGOGASTRODUODENOSCOPY (EGD);  Surgeon: Daneil Dolin, MD;  [facility]: [facility];  Service: Endoscopy;  Laterality: N/A;  11:15am  ? Joint fusion on foot Right   ? LIVER BIOPSY N/A 03/15/2022  ? Procedure: LIVER BIOPSY;  Surgeon: Virl Cagey, MD;  Location: [facility];  Service: General;  Laterality: N/A;  ? MALONEY DILATION N/A 05/12/2020  ? Procedure: MALONEY DILATION;  Surgeon: Daneil Dolin, MD;  Location: [facility];  Service: Endoscopy;  Laterality:  N/A;  ? NASAL SEPTUM SURGERY    ? SINOSCOPY    ? ? ?Family History  ?Problem Relation Age of Onset  ? Hypertension Father   ? Hyperlipidemia Father   ? Diabetes Father   ? Stroke Father   ? Depression Father   ? Anxiety disorder Father   ? Hypertension Mother   ? Hyperlipidemia Mother   ? Diabetes Mellitus II Mother   ? Congestive Heart Failure Mother   ? Heart attack Mother   ? Hypertension Sister   ? Diabetes Sister   ? Hypertension Brother   ? Autoimmune disease Brother   ? Diabetes Maternal Grandmother   ? Lupus Paternal Grandmother   ? Depression Paternal Grandmother   ? Anxiety disorder Paternal Grandmother   ? Depression Maternal Aunt   ? Anxiety disorder Maternal Aunt   ? Colon cancer Neg Hx   ? Esophageal cancer Neg Hx   ? Gastric cancer Neg Hx   ? ? ?Social History  ? ?Socioeconomic History  ? Marital status: Married  ?  Spouse name: Not on file  ? Number of children: 0  ? Years of education: Not on file  ? Highest education level: Bachelor's degree (e.g., BA, AB, BS)  ?Occupational History  ? Occupation: Engineer, mining development  ?Tobacco Use  ? Smoking status: Former  ?  Types:  Cigarettes  ?  Quit date: 12/30/1982  ?  Years since quitting: 39.3  ? Smokeless tobacco: Never  ?Vaping Use  ? Vaping Use: Never used  ?Substance and Sexual Activity  ? Alcohol use: Not Currently  ?  Alcohol/week: 0.0 standard drinks  ? Drug use: Never  ? Sexual activity: Yes  ?  Birth control/protection: Post-menopausal  ?Other Topics Concern  ? Not on file  ?Social History Narrative  ? Married for 3 years.Lives with husband.Bachelors in Education officer, [employer].  ? Right handed  ? Caffeine: 1-2 per day   ? ?Social Determinants of Health  ? ?Financial Resource Strain: Not on file  ?Food Insecurity: No Food Insecurity  ? Worried About Charity fundraiser in the Last Year: Never true  ? Ran Out of Food in the Last Year: Never true  ?Transportation Needs: No Transportation Needs  ? Lack of Transportation (Medical): No  ? Lack of  Transportation (Non-Medical): No  ?Physical Activity: Not on file  ?Stress: Not on file  ?Social Connections: Not on file  ?Intimate Partner Violence: Not on file  ? ? ?Outpatient Medications Prior to Visit  ?Medication Sig Dispense Refill  ? ALPRAZolam (XANAX) 0.5 MG tablet Take 0.25 mg by mouth daily as needed for anxiety.    ? Aspirin-Caffeine (BC FAST PAIN RELIEF PO) Take 1 packet by mouth daily as needed (pain).    ? B-12 Methylcobalamin 1000 MCG TBDP Take 1,000 mcg by mouth daily.    ? blood glucose meter kit and supplies KIT Inject 1 each into the skin daily. Check blood glucose once a day. Diagnosis code: E 11.9. 1 each 0  ? buPROPion ER (WELLBUTRIN SR) 100 MG 12 hr tablet Take 100 mg by mouth daily.    ? calcium carbonate (TUMS - DOSED IN MG ELEMENTAL CALCIUM) 500 MG chewable tablet Chew 1,000 mg by mouth daily as needed for indigestion or heartburn.    ? chlorthalidone (HYGROTON) 25 MG tablet Take 0.5 tablets (12.5 mg total) by mouth daily. 45 tablet 1  ? Cholecalciferol (VITAMIN D3) 125 MCG (5000 UT) TABS Take 2 tablets (10,000 Units total) by mouth daily. 180 tablet 1  ? Continuous Blood Gluc Sensor (DEXCOM G6 SENSOR) MISC by Does not apply route.    ? Continuous Blood Gluc Transmit (DEXCOM G6 TRANSMITTER) MISC by Does not apply route.    ? DULoxetine (CYMBALTA) 30 MG capsule Take 30 mg by mouth daily in the afternoon.    ? DULoxetine (CYMBALTA) 60 MG capsule Take 60 mg by mouth every morning.    ? empagliflozin (JARDIANCE) 10 MG TABS tablet TAKE 1 TABLET(10 MG) BY MOUTH DAILY BEFORE BREAKFAST 30 tablet 2  ? glucose blood (ACCU-CHEK GUIDE) test strip 1 each by Other route in the morning, at noon, and at bedtime. Use as instructed 300 each 3  ? ibuprofen (ADVIL) 200 MG tablet Take 600 mg by mouth every 6 (six) hours as needed for moderate pain or headache.    ? insulin glargine, 2 Unit Dial, (TOUJEO MAX SOLOSTAR) 300 UNIT/ML Solostar Pen Inject 124 Units into the skin every morning. And pen needles 1/day  (Patient taking differently: Inject 150 Units into the skin every morning. And pen needles 1/day) 42 mL 3  ? Insulin Pen Needle (PEN NEEDLES) 32G X 4 MM MISC Check BS 2 x a day 100 each 5  ? KLOR-CON M20 20 MEQ tablet TAKE 2 TABLETS BY MOUTH TWICE A DAY (Patient taking differently: Take 40 mEq by mouth  daily.) 360 tablet 1  ? L-Methylfolate 15 MG TABS Take 15 mg by mouth daily at 6 (six) AM.    ? losartan (COZAAR) 25 MG tablet TAKE 1 TABLET(25 MG) BY MOUTH DAILY 90 tablet 0  ? metFORMIN (GLUCOPHAGE-XR) 500 MG 24 hr tablet Take 2 tablets (1,000 mg total) by mouth daily with breakfast. (Patient taking differently: Take 1,000 mg by mouth daily with supper.) 180 tablet 3  ? metoprolol tartrate (LOPRESSOR) 25 MG tablet Take 1 tablet (25 mg total) by mouth 2 (two) times daily. 180 tablet 3  ? metroNIDAZOLE (METROGEL) 0.75 % gel Apply 1 application. topically 2 (two) times daily. 45 g 0  ? Multiple Vitamins-Minerals (MULTIVITAMIN WOMEN 50+) TABS Take 1 tablet by mouth daily.    ? ondansetron (ZOFRAN) 4 MG tablet Take 1 tablet (4 mg total) by mouth every 8 (eight) hours as needed. 30 tablet 1  ? oxyCODONE (ROXICODONE) 5 MG immediate release tablet Take 1 tablet (5 mg total) by mouth every 4 (four) hours as needed for severe pain or breakthrough pain. 20 tablet 0  ? pantoprazole (PROTONIX) 40 MG tablet Take 1 tablet (40 mg total) by mouth daily. 90 tablet 0  ? PREDNISOLON-MOXIFLOX-BROMFENAC OP Place 1 drop into the right eye daily.    ? Probiotic Product (PROBIOTIC PO) Take 1 capsule by mouth daily.    ? rosuvastatin (CRESTOR) 10 MG tablet Take 1 tablet (10 mg total) by mouth daily. (Patient taking differently: Take 10 mg by mouth every other day.) 30 tablet 11  ? SUMAtriptan (IMITREX) 50 MG tablet MAY REPEAT IN 2 HOURS IF HEADACHE PERSISTS OR RECURS. MAXIMUM 2 PER DAY. 10 tablet 0  ? traZODone (DESYREL) 50 MG tablet Take 12.5 mg by mouth at bedtime.    ? ?No facility-administered medications prior to visit.  ? ? ?Allergies   ?Allergen Reactions  ? Cephalexin Itching, Swelling and Other (See Comments)  ? ? ?Review of Systems  ?Constitutional:  Negative for chills and fever.  ?HENT:  Positive for postnasal drip, sinus pressure and

## 2022-04-19 ENCOUNTER — Encounter: Payer: Self-pay | Admitting: Endocrinology

## 2022-04-19 LAB — MICROALBUMIN / CREATININE URINE RATIO
Creatinine, Urine: 44.5 mg/dL
Microalb/Creat Ratio: <7 mg/g creat (ref 0–29)
Microalbumin, Urine: <3 ug/mL

## 2022-04-24 ENCOUNTER — Encounter: Payer: Self-pay | Admitting: Internal Medicine

## 2022-04-25 ENCOUNTER — Encounter: Payer: Self-pay | Admitting: General Surgery

## 2022-04-25 ENCOUNTER — Ambulatory Visit (INDEPENDENT_AMBULATORY_CARE_PROVIDER_SITE_OTHER): Payer: Medicare Other | Admitting: General Surgery

## 2022-04-25 ENCOUNTER — Telehealth: Payer: Self-pay | Admitting: *Deleted

## 2022-04-25 ENCOUNTER — Telehealth (INDEPENDENT_AMBULATORY_CARE_PROVIDER_SITE_OTHER): Payer: Medicare Other | Admitting: Endocrinology

## 2022-04-25 ENCOUNTER — Other Ambulatory Visit: Payer: Self-pay | Admitting: *Deleted

## 2022-04-25 ENCOUNTER — Encounter: Payer: Medicare Other | Admitting: General Surgery

## 2022-04-25 ENCOUNTER — Other Ambulatory Visit: Payer: Self-pay

## 2022-04-25 ENCOUNTER — Encounter: Payer: Self-pay | Admitting: Internal Medicine

## 2022-04-25 VITALS — BP 124/83 | HR 90 | Temp 98.7°F | Resp 16 | Ht 69.0 in | Wt 269.0 lb

## 2022-04-25 DIAGNOSIS — E1165 Type 2 diabetes mellitus with hyperglycemia: Secondary | ICD-10-CM

## 2022-04-25 DIAGNOSIS — T8130XA Disruption of wound, unspecified, initial encounter: Secondary | ICD-10-CM

## 2022-04-25 DIAGNOSIS — M7989 Other specified soft tissue disorders: Secondary | ICD-10-CM

## 2022-04-25 MED ORDER — CHLORTHALIDONE 25 MG PO TABS: 12.5000 mg | ORAL_TABLET | Freq: Every day | ORAL | 1 refills | Status: DC

## 2022-04-25 NOTE — Patient Instructions (Addendum)
check your blood sugar twice a day.  vary the time of day when you check, between before the 3 meals, and at bedtime.  also check if you have symptoms of your blood sugar being too high or too low.  please keep a record of the readings and bring it to your next appointment here (or you can bring the meter itself).  You can write it on any piece of paper.  please call us sooner if your blood sugar goes below 70, or if most of your readings are over 200.   ?Please continue the same Toujeo for now. ?Better options would be to add a faster-acting insulin 3 times a day (just before each meal), or to just change the Toujeo to a faster-acting insulin each morning.   ?After a few months have passed after the pancreatitis episode, you could try resuming the Trulicity.   ?You should have an endocrinology follow-up appointment in 2-3 months.   ?

## 2022-04-25 NOTE — Telephone Encounter (Signed)
Received call from patient [phone]~ telephone.  ?  ?Surgical Date: 03/15/2022. ?Procedure: Lap Chole ?  ?Concerns: Patient reports drainage from right sided middle incision. States that area had previously opened and scabbed over. States that area is draining cloudy colored discharge with bright red bloody tinge.  ?  ?Denies redness, warmth to touch at incision to naval. Denies fever/ chills, nausea, vomiting, difficulty with BM or changes in appetite.  ? ?Requested provider assess area. Appointment scheduled for evaluation.  ?

## 2022-04-25 NOTE — Progress Notes (Signed)
[facility] ? ?One of the right side port sites opened, < 28m deep and 327mwide. Probed, silver nitrate applied. No cellulitis just local redness around the incision.  ? ?Patient s/p lap chole who is doing well with everything but did have a wound dehiscence.  ?Peroxide and neosporin to area daily. ?Send photo on my chart next week 5/4.  ?Will order lipase next week. ? ?LiCurlene LabrumMD ?[facility] ?[address] [place] [phone]) ? ?

## 2022-04-25 NOTE — Patient Instructions (Addendum)
Peroxide and neosporin to area daily. ?Send photo on my chart next week 5/4.  ?Will order lipase next week.  ? ?

## 2022-04-25 NOTE — Progress Notes (Signed)
? ?Subjective:  ? ? Patient ID: Sydney Taylor, female    DOB: September 07, 1966, 56 y.o.   MRN: [id number] ? ?HPI ?telehealth visit today via video visit.  ?Alternatives to telehealth are presented to this patient, and the patient agrees to the telehealth visit. ?Pt is advised of the cost of the visit, and agrees to this, also.   ?Patient is in her car, and I am at the office.   ?Persons attending the telehealth visit: the patient and I ?Pt returns for f/u of diabetes mellitus:  ?DM type: Insulin-requiring type 2 ?Dx'ed: 2021 ?Complications: PN ?Therapy: insulin since 2022, and 2 oral meds.   ?GDM: never ?DKA: never ?Severe hypoglycemia: never.   ?Pancreatitis: once (2023, GB).  Trulicity was stopped.   ?Pancreatic imaging: normal on 2022 CT.   ?SDOH: therapy has been limited by pt's request for least expensive meds.   ?Other: she takes QD insulin, at least for now; she eats meals at 7AM, 12N, and 6-7PM.   ?Interval hx: pt says cbg varies from 85-300.  It is in general higher as the day goes on.  She takes meds as rx'ed.   ?Past Medical History:  ?Diagnosis Date  ? Anemia   ? Anxiety   ? Arrhythmia   ? Bipolar depression ([place]) 12/28/2019  ? Chronic fatigue   ? Chronic headaches   ? Depression   ? Diabetes mellitus without complication ([place])   ? Fibromyalgia   ? GERD (gastroesophageal reflux disease)   ? High cholesterol   ? HLD (hyperlipidemia)   ? Hypertension   ? Insect bite   ? Migraine   ? Prediabetes   ? Sleep apnea   ? Urinary incontinence   ? ? ?Past Surgical History:  ?Procedure Laterality Date  ? CHOLECYSTECTOMY N/A 03/15/2022  ? Procedure: LAPAROSCOPIC CHOLECYSTECTOMY WITH INTRAOPERATIVE CHOLANGIOGRAM;  Surgeon: Virl Cagey, MD;  Location: [facility];  Service: General;  Laterality: N/A;  ? COLONOSCOPY  2017  ? per patient normal and needs next one in 2027 (Dr. Posey Pronto)  ? ESOPHAGOGASTRODUODENOSCOPY N/A 05/12/2020  ? Procedure: ESOPHAGOGASTRODUODENOSCOPY (EGD);  Surgeon: Daneil Dolin, MD;  [facility];  Service: Endoscopy;  Laterality: N/A;  11:15am  ? Joint fusion on foot Right   ? LIVER BIOPSY N/A 03/15/2022  ? Procedure: LIVER BIOPSY;  Surgeon: Virl Cagey, MD;  Location: [facility];  Service: General;  Laterality: N/A;  ? MALONEY DILATION N/A 05/12/2020  ? Procedure: MALONEY DILATION;  Surgeon: Daneil Dolin, MD;  [facility];  Service: Endoscopy;  Laterality: N/A;  ? NASAL SEPTUM SURGERY    ? SINOSCOPY    ? ? ?Social History  ? ?Socioeconomic History  ? Marital status: Married  ?  Spouse name: Not on file  ? Number of children: 0  ? Years of education: Not on file  ? Highest education level: Bachelor's degree (e.g., BA, AB, BS)  ?Occupational History  ? Occupation: Engineer, mining development  ?Tobacco Use  ? Smoking status: Former  ?  Types: Cigarettes  ?  Quit date: 12/30/1982  ?  Years since quitting: 39.3  ? Smokeless tobacco: Never  ?Vaping Use  ? Vaping Use: Never used  ?Substance and Sexual Activity  ? Alcohol use: Not Currently  ?  Alcohol/week: 0.0 standard drinks  ? Drug use: Never  ? Sexual activity: Yes  ?  Birth control/protection: Post-menopausal  ?Other Topics Concern  ? Not on file  ?Social History Narrative  ?  Married for 3 years.Lives with husband.Bachelors in Education officer, [employer].  ? Right handed  ? Caffeine: 1-2 per day   ? ?Social Determinants of Health  ? ?Financial Resource Strain: Not on file  ?Food Insecurity: No Food Insecurity  ? Worried About Charity fundraiser in the Last Year: Never true  ? Ran Out of Food in the Last Year: Never true  ?Transportation Needs: No Transportation Needs  ? Lack of Transportation (Medical): No  ? Lack of Transportation (Non-Medical): No  ?Physical Activity: Not on file  ?Stress: Not on file  ?Social Connections: Not on file  ?Intimate Partner Violence: Not on file  ? ? ?Current Outpatient Medications on File Prior to Visit  ?Medication Sig Dispense Refill  ? ALPRAZolam (XANAX) 0.5 MG tablet Take 0.25 mg by mouth daily as needed for  anxiety.    ? Aspirin-Caffeine (BC FAST PAIN RELIEF PO) Take 1 packet by mouth daily as needed (pain).    ? B-12 Methylcobalamin 1000 MCG TBDP Take 1,000 mcg by mouth daily.    ? blood glucose meter kit and supplies KIT Inject 1 each into the skin daily. Check blood glucose once a day. Diagnosis code: E 11.9. 1 each 0  ? buPROPion ER (WELLBUTRIN SR) 100 MG 12 hr tablet Take 100 mg by mouth daily.    ? calcium carbonate (TUMS - DOSED IN MG ELEMENTAL CALCIUM) 500 MG chewable tablet Chew 1,000 mg by mouth daily as needed for indigestion or heartburn.    ? Cholecalciferol (VITAMIN D3) 125 MCG (5000 UT) TABS Take 2 tablets (10,000 Units total) by mouth daily. 180 tablet 1  ? Continuous Blood Gluc Sensor (DEXCOM G6 SENSOR) MISC by Does not apply route.    ? Continuous Blood Gluc Transmit (DEXCOM G6 TRANSMITTER) MISC by Does not apply route.    ? DULoxetine (CYMBALTA) 30 MG capsule Take 30 mg by mouth daily in the afternoon.    ? DULoxetine (CYMBALTA) 60 MG capsule Take 60 mg by mouth every morning.    ? empagliflozin (JARDIANCE) 10 MG TABS tablet TAKE 1 TABLET(10 MG) BY MOUTH DAILY BEFORE BREAKFAST 30 tablet 2  ? fluconazole (DIFLUCAN) 150 MG tablet Take 2 tablets (300 mg total) by mouth every 7 (seven) days. 8 tablet 0  ? glucose blood (ACCU-CHEK GUIDE) test strip 1 each by Other route in the morning, at noon, and at bedtime. Use as instructed 300 each 3  ? ibuprofen (ADVIL) 200 MG tablet Take 600 mg by mouth every 6 (six) hours as needed for moderate pain or headache.    ? insulin glargine, 2 Unit Dial, (TOUJEO MAX SOLOSTAR) 300 UNIT/ML Solostar Pen Inject 124 Units into the skin every morning. And pen needles 1/day (Patient taking differently: Inject 150 Units into the skin every morning. And pen needles 1/day) 42 mL 3  ? Insulin Pen Needle (PEN NEEDLES) 32G X 4 MM MISC Check BS 2 x a day 100 each 5  ? KLOR-CON M20 20 MEQ tablet TAKE 2 TABLETS BY MOUTH TWICE A DAY (Patient taking differently: Take 40 mEq by mouth  daily.) 360 tablet 1  ? L-Methylfolate 15 MG TABS Take 15 mg by mouth daily at 6 (six) AM.    ? losartan (COZAAR) 25 MG tablet TAKE 1 TABLET(25 MG) BY MOUTH DAILY 90 tablet 0  ? metFORMIN (GLUCOPHAGE-XR) 500 MG 24 hr tablet Take 2 tablets (1,000 mg total) by mouth daily with breakfast. (Patient taking differently: Take 1,000 mg by mouth daily with supper.) 180  tablet 3  ? metoprolol tartrate (LOPRESSOR) 25 MG tablet Take 1 tablet (25 mg total) by mouth 2 (two) times daily. 180 tablet 3  ? metroNIDAZOLE (METROGEL) 0.75 % gel Apply 1 application. topically 2 (two) times daily. 45 g 0  ? Multiple Vitamins-Minerals (MULTIVITAMIN WOMEN 50+) TABS Take 1 tablet by mouth daily.    ? ondansetron (ZOFRAN) 4 MG tablet Take 1 tablet (4 mg total) by mouth every 8 (eight) hours as needed. 30 tablet 1  ? oxyCODONE (ROXICODONE) 5 MG immediate release tablet Take 1 tablet (5 mg total) by mouth every 4 (four) hours as needed for severe pain or breakthrough pain. 20 tablet 0  ? pantoprazole (PROTONIX) 40 MG tablet Take 1 tablet (40 mg total) by mouth daily. 90 tablet 0  ? PREDNISOLON-MOXIFLOX-BROMFENAC OP Place 1 drop into the right eye daily.    ? Probiotic Product (PROBIOTIC PO) Take 1 capsule by mouth daily.    ? rosuvastatin (CRESTOR) 10 MG tablet Take 1 tablet (10 mg total) by mouth daily. (Patient taking differently: Take 10 mg by mouth every other day.) 30 tablet 11  ? SUMAtriptan (IMITREX) 50 MG tablet MAY REPEAT IN 2 HOURS IF HEADACHE PERSISTS OR RECURS. MAXIMUM 2 PER DAY. 10 tablet 0  ? traZODone (DESYREL) 50 MG tablet Take 12.5 mg by mouth at bedtime.    ? ?No current facility-administered medications on file prior to visit.  ? ? ?Allergies  ?Allergen Reactions  ? Cephalexin Itching, Swelling and Other (See Comments)  ? ? ?Family History  ?Problem Relation Age of Onset  ? Hypertension Father   ? Hyperlipidemia Father   ? Diabetes Father   ? Stroke Father   ? Depression Father   ? Anxiety disorder Father   ? Hypertension  Mother   ? Hyperlipidemia Mother   ? Diabetes Mellitus II Mother   ? Congestive Heart Failure Mother   ? Heart attack Mother   ? Hypertension Sister   ? Diabetes Sister   ? Hypertension Brother   ? Autoimmune d

## 2022-04-26 ENCOUNTER — Encounter: Payer: Self-pay | Admitting: Internal Medicine

## 2022-04-28 ENCOUNTER — Encounter: Payer: Self-pay | Admitting: Endocrinology

## 2022-04-29 ENCOUNTER — Other Ambulatory Visit: Payer: Self-pay | Admitting: Endocrinology

## 2022-04-29 MED ORDER — NOVOLOG FLEXPEN 100 UNIT/ML ~~LOC~~ SOPN: 10.0000 [IU] | PEN_INJECTOR | Freq: Every day | SUBCUTANEOUS | 1 refills | Status: DC

## 2022-04-30 ENCOUNTER — Other Ambulatory Visit: Payer: Self-pay

## 2022-04-30 ENCOUNTER — Other Ambulatory Visit (INDEPENDENT_AMBULATORY_CARE_PROVIDER_SITE_OTHER): Payer: Medicare Other | Admitting: General Surgery

## 2022-04-30 ENCOUNTER — Encounter: Payer: Medicare Other | Admitting: General Surgery

## 2022-04-30 DIAGNOSIS — E1165 Type 2 diabetes mellitus with hyperglycemia: Secondary | ICD-10-CM

## 2022-04-30 DIAGNOSIS — K851 Biliary acute pancreatitis without necrosis or infection: Secondary | ICD-10-CM

## 2022-04-30 MED ORDER — NOVOLOG FLEXPEN 100 UNIT/ML ~~LOC~~ SOPN: 10.0000 [IU] | PEN_INJECTOR | Freq: Every day | SUBCUTANEOUS | 1 refills | Status: DC

## 2022-04-30 NOTE — Telephone Encounter (Signed)
Appointment schedule  

## 2022-04-30 NOTE — Progress Notes (Signed)
Lipase ordered for follow up. ? ?Curlene Labrum, MD ?[facility] ?[address] [place], [place] [id number] ?[phone] (office) ? ?

## 2022-05-01 ENCOUNTER — Encounter: Payer: Self-pay | Admitting: Nurse Practitioner

## 2022-05-01 ENCOUNTER — Telehealth: Payer: Self-pay

## 2022-05-01 ENCOUNTER — Telehealth (INDEPENDENT_AMBULATORY_CARE_PROVIDER_SITE_OTHER): Payer: Medicare Other | Admitting: Nurse Practitioner

## 2022-05-01 ENCOUNTER — Ambulatory Visit (INDEPENDENT_AMBULATORY_CARE_PROVIDER_SITE_OTHER): Payer: Medicare Other | Admitting: *Deleted

## 2022-05-01 DIAGNOSIS — R21 Rash and other nonspecific skin eruption: Secondary | ICD-10-CM

## 2022-05-01 DIAGNOSIS — J309 Allergic rhinitis, unspecified: Secondary | ICD-10-CM

## 2022-05-01 DIAGNOSIS — Z20822 Contact with and (suspected) exposure to covid-19: Secondary | ICD-10-CM

## 2022-05-01 DIAGNOSIS — R051 Acute cough: Secondary | ICD-10-CM | POA: Diagnosis not present

## 2022-05-01 DIAGNOSIS — E1165 Type 2 diabetes mellitus with hyperglycemia: Secondary | ICD-10-CM

## 2022-05-01 LAB — POCT INFLUENZA A/B
Influenza A, POC: NEGATIVE
Influenza B, POC: NEGATIVE

## 2022-05-01 MED ORDER — FLUTICASONE PROPIONATE 50 MCG/ACT NA SUSP: 2.0000 | Freq: Every day | NASAL | 6 refills | Status: DC

## 2022-05-01 MED ORDER — INSULIN LISPRO 100 UNIT/ML IJ SOLN: INTRAMUSCULAR | 1 refills | Status: DC

## 2022-05-01 NOTE — Assessment & Plan Note (Addendum)
Rx Flonase nasal spray, take 2 spray into both nostrils daily, avoid allergens ?

## 2022-05-01 NOTE — Telephone Encounter (Signed)
Per pharmacist: Patient's insurance prefers Humalog, Okay to change? ?

## 2022-05-01 NOTE — Assessment & Plan Note (Signed)
Nonproductive cough that started 1 day ago denies fever chills, cough probably due to allergies ?But Will do flu and COVID test to rule out these viral infection.  Patient will come by the office to get test. ?

## 2022-05-01 NOTE — Progress Notes (Signed)
Virtual Visit via Telephone Note ? ?I connected with Quintana Canelo @ on 05/01/22 at 0940am by video and verified that I am speaking with the correct person using two identifiers.  I spent 10 minutes on this video encounter. ? ?Location: ?Patient: home ?Provider: office ?  ?I discussed the limitations, risks, security and privacy concerns of performing an evaluation and management service by telephone and the availability of in person appointments. I also discussed with the patient that there may be a patient responsible charge related to this service. The patient expressed understanding and agreed to proceed. ? ? ?History of Present Illness: ? Ms. Frary with past medical history of GERD, type 2 diabetes, morbid obesity, hyperlipidemia, sleep apnea c/o rashes on the top of her right foot near the ankle since the past 5 days, states that she has hives a lot in the past,  rashes looks red in color, no itching, no drainage. Denies using a new skin products , concerned that this might be related to her diabetes.  Patient denies fever chills body aches ?  ?Allergies patient stated that she has scratchy throat , non productive cough, sneezing, stuffy nose,  that started yesterday, denies fever, chills, sob, wheezing nasal drainage. She has not taken any med for her symptoms, up to date with flu vaccine, last COVID vaccine was last July.  ? ?Observations/Objective: ?Multiple small brownish colored rashes noted on the right foot, no ulcer, wound, drainage noted, picture sent to the office reviewed due to not been able to properly visualize patient's foot on the video. . ? ?Assessment and Plan: ? ?Allergic rhinitis ?Rx Flonase nasal spray, take 2 spray into both nostrils daily, avoid allergens ? ?Rash and other nonspecific skin eruption ?Non itching erythematous rash at noted on right foot. ?Will monitor for now, if rashes does not get better or starts itching will order Kenalog cream for dermatitis ?Patient told to call  the office if her rashes does not get better, keep skin well moisturized and dry ? ?Acute cough ?Nonproductive cough that started 1 day ago denies fever chills, cough probably due to allergies ?But Will do flu and COVID test to rule out these viral infection.  Patient will come by the office to get test.  ? ? ?Follow Up Instructions: ? ?  ?I discussed the assessment and treatment plan with the patient. The patient was provided an opportunity to ask questions and all were answered. The patient agreed with the plan and demonstrated an understanding of the instructions. ?  ?The patient was advised to call back or seek an in-person evaluation if the symptoms worsen or if the condition fails to improve as anticipated.  ?

## 2022-05-01 NOTE — Assessment & Plan Note (Addendum)
Non itching brownish colored rashes at noted on right foot. ?Will monitor for now, if rashes does not get better or starts itching will order Kenalog cream for dermatitis ?Patient told to call the office if her rashes does not get better, keep skin well moisturized and dry ?

## 2022-05-01 NOTE — Addendum Note (Signed)
Addended by: Sarina Ill on: 05/01/2022 04:14 PM ? ? Modules accepted: Orders ? ?

## 2022-05-02 ENCOUNTER — Ambulatory Visit: Payer: Medicare Other | Admitting: Internal Medicine

## 2022-05-02 LAB — LIPASE: Lipase: 32 U/L (ref 7–60)

## 2022-05-02 NOTE — Progress Notes (Signed)
Flu negative , covid  test pending

## 2022-05-03 LAB — NOVEL CORONAVIRUS, NAA: SARS-CoV-2, NAA: NOT DETECTED

## 2022-05-03 NOTE — Progress Notes (Signed)
Covid test negative

## 2022-05-04 ENCOUNTER — Other Ambulatory Visit: Payer: Self-pay | Admitting: Internal Medicine

## 2022-05-04 DIAGNOSIS — M7989 Other specified soft tissue disorders: Secondary | ICD-10-CM

## 2022-05-07 ENCOUNTER — Encounter: Payer: Self-pay | Admitting: Internal Medicine

## 2022-05-08 ENCOUNTER — Other Ambulatory Visit: Payer: Self-pay

## 2022-05-08 ENCOUNTER — Other Ambulatory Visit: Payer: Self-pay | Admitting: *Deleted

## 2022-05-08 DIAGNOSIS — E1165 Type 2 diabetes mellitus with hyperglycemia: Secondary | ICD-10-CM

## 2022-05-08 MED ORDER — LOSARTAN POTASSIUM 25 MG PO TABS: ORAL_TABLET | ORAL | 0 refills | Status: DC

## 2022-05-08 MED ORDER — INSULIN LISPRO 100 UNIT/ML IJ SOLN: INTRAMUSCULAR | 1 refills | Status: DC

## 2022-05-09 ENCOUNTER — Ambulatory Visit (INDEPENDENT_AMBULATORY_CARE_PROVIDER_SITE_OTHER): Payer: Medicare Other | Admitting: *Deleted

## 2022-05-09 DIAGNOSIS — E1165 Type 2 diabetes mellitus with hyperglycemia: Secondary | ICD-10-CM

## 2022-05-09 DIAGNOSIS — E782 Mixed hyperlipidemia: Secondary | ICD-10-CM

## 2022-05-09 NOTE — Chronic Care Management (AMB) (Signed)
?Chronic Care Management  ? ?CCM RN Visit Note ? ?05/09/2022 ?Name: Sydney Taylor MRN: [id number] DOB: 1966-11-30 ? ?Subjective: ?Sydney Taylor is a 56 y.o. year old female who is a primary care patient of Sydney Spar, MD. The care management team was consulted for assistance with disease management and care coordination needs.   ? ?Engaged with patient by telephone for follow up visit in response to provider referral for case management and/or care coordination services.  ? ?Consent to Services:  ?The patient was given information about Chronic Care Management services, agreed to services, and gave verbal consent prior to initiation of services.  Please see initial visit note for detailed documentation.  ? ?Patient agreed to services and verbal consent obtained.  ? ?Assessment: Review of patient past medical history, allergies, medications, health status, including review of consultants reports, laboratory and other test data, was performed as part of comprehensive evaluation and provision of chronic care management services.  ? ?SDOH (Social Determinants of Health) assessments and interventions performed:   ? ?CCM Care Plan ? ?Allergies  ?Allergen Reactions  ? Cephalexin Itching, Swelling and Other (See Comments)  ? ? ?Outpatient Encounter Medications as of 05/09/2022  ?Medication Sig Note  ? ALPRAZolam (XANAX) 0.5 MG tablet Take 0.25 mg by mouth daily as needed for anxiety.   ? Aspirin-Caffeine (BC FAST PAIN RELIEF PO) Take 1 packet by mouth daily as needed (pain).   ? B-12 Methylcobalamin 1000 MCG TBDP Take 1,000 mcg by mouth daily.   ? blood glucose meter kit and supplies KIT Inject 1 each into the skin daily. Check blood glucose once a day. Diagnosis code: E 11.9.   ? calcium carbonate (TUMS - DOSED IN MG ELEMENTAL CALCIUM) 500 MG chewable tablet Chew 1,000 mg by mouth daily as needed for indigestion or heartburn.   ? chlorthalidone (HYGROTON) 25 MG tablet Take 0.5 tablets (12.5 mg total) by mouth  daily.   ? Cholecalciferol (VITAMIN D3) 125 MCG (5000 UT) TABS Take 2 tablets (10,000 Units total) by mouth daily.   ? Continuous Blood Gluc Sensor (DEXCOM G6 SENSOR) MISC by Does not apply route.   ? Continuous Blood Gluc Transmit (DEXCOM G6 TRANSMITTER) MISC by Does not apply route.   ? DULoxetine (CYMBALTA) 60 MG capsule Take 60 mg by mouth every morning. 05/01/2022: Takes twice a day  ? empagliflozin (JARDIANCE) 10 MG TABS tablet TAKE 1 TABLET(10 MG) BY MOUTH DAILY BEFORE BREAKFAST   ? fluconazole (DIFLUCAN) 150 MG tablet Take 2 tablets (300 mg total) by mouth every 7 (seven) days.   ? fluticasone (FLONASE) 50 MCG/ACT nasal spray Place 2 sprays into both nostrils daily.   ? glucose blood (ACCU-CHEK GUIDE) test strip 1 each by Other route in the morning, at noon, and at bedtime. Use as instructed   ? ibuprofen (ADVIL) 200 MG tablet Take 600 mg by mouth every 6 (six) hours as needed for moderate pain or headache.   ? insulin glargine, 2 Unit Dial, (TOUJEO MAX SOLOSTAR) 300 UNIT/ML Solostar Pen Inject 124 Units into the skin every morning. And pen needles 1/day (Patient taking differently: Inject 150 Units into the skin every morning. And pen needles 1/day) 05/01/2022: 170 units in the morning   ? insulin lispro (HUMALOG) 100 UNIT/ML injection Inject 10 Units into the skin daily with supper   ? Insulin Pen Needle (PEN NEEDLES) 32G X 4 MM MISC Check BS 2 x a day   ? KLOR-CON M20 20 MEQ tablet TAKE 2  TABLETS BY MOUTH TWICE A DAY (Patient taking differently: Take 40 mEq by mouth daily.)   ? L-Methylfolate 15 MG TABS Take 15 mg by mouth daily at 6 (six) AM.   ? losartan (COZAAR) 25 MG tablet TAKE 1 TABLET(25 MG) BY MOUTH DAILY   ? metFORMIN (GLUCOPHAGE-XR) 500 MG 24 hr tablet Take 2 tablets (1,000 mg total) by mouth daily with breakfast. (Patient taking differently: Take 1,000 mg by mouth daily with supper.)   ? metoprolol tartrate (LOPRESSOR) 25 MG tablet Take 1 tablet (25 mg total) by mouth 2 (two) times daily.   ?  metroNIDAZOLE (METROGEL) 0.75 % gel Apply 1 application. topically 2 (two) times daily.   ? Multiple Vitamins-Minerals (MULTIVITAMIN WOMEN 50+) TABS Take 1 tablet by mouth daily.   ? pantoprazole (PROTONIX) 40 MG tablet Take 1 tablet (40 mg total) by mouth daily.   ? Probiotic Product (PROBIOTIC PO) Take 1 capsule by mouth daily.   ? rosuvastatin (CRESTOR) 10 MG tablet Take 1 tablet (10 mg total) by mouth daily. (Patient taking differently: Take 10 mg by mouth every other day.)   ? SUMAtriptan (IMITREX) 50 MG tablet MAY REPEAT IN 2 HOURS IF HEADACHE PERSISTS OR RECURS. MAXIMUM 2 PER DAY.   ? traZODone (DESYREL) 50 MG tablet Take 12.5 mg by mouth at bedtime.   ? buPROPion ER (WELLBUTRIN SR) 100 MG 12 hr tablet Take 100 mg by mouth daily. (Patient not taking: Reported on 05/01/2022)   ? DULoxetine (CYMBALTA) 30 MG capsule Take 30 mg by mouth daily in the afternoon. (Patient not taking: Reported on 05/09/2022)   ? ondansetron (ZOFRAN) 4 MG tablet Take 1 tablet (4 mg total) by mouth every 8 (eight) hours as needed. (Patient not taking: Reported on 05/01/2022)   ? oxyCODONE (ROXICODONE) 5 MG immediate release tablet Take 1 tablet (5 mg total) by mouth every 4 (four) hours as needed for severe pain or breakthrough pain. (Patient not taking: Reported on 05/01/2022)   ? PREDNISOLON-MOXIFLOX-BROMFENAC OP Place 1 drop into the right eye daily. (Patient not taking: Reported on 05/01/2022)   ? ?No facility-administered encounter medications on file as of 05/09/2022.  ? ? ?Patient Active Problem List  ? Diagnosis Date Noted  ? Allergic rhinitis 05/01/2022  ? Rash and other nonspecific skin eruption 05/01/2022  ? Acute cough 05/01/2022  ? Indigestion 03/05/2022  ? Bloating 03/05/2022  ? Fatty liver 03/05/2022  ? Gallstone pancreatitis 03/04/2022  ? Acute pancreatitis 02/12/2022  ? Migraine with aura 02/01/2022  ? Encounter for general adult medical examination with abnormal findings 02/01/2022  ? Plantar fasciitis 02/01/2022  ? Chest pain  01/14/2022  ? Chronic migraine without aura without status migrainosus, not intractable 10/29/2021  ? Diabetic polyneuropathy associated with type 2 diabetes mellitus ([place]) 09/19/2021  ? History of hormone therapy 09/19/2021  ? Bilateral primary osteoarthritis of knee 04/10/2021  ? Perennial allergic rhinitis 04/06/2021  ? Allergic contact dermatitis 04/06/2021  ? Cholinergic urticaria 04/06/2021  ? Fibromyalgia syndrome 01/12/2021  ? Elevated C-reactive protein (CRP) 01/12/2021  ? Diverticulosis of colon without hemorrhage 01/02/2021  ? Major depressive disorder, single episode, severe ([place]) 07/14/2020  ? GERD (gastroesophageal reflux disease) 05/11/2020  ? Type 2 diabetes mellitus ([place]) 03/27/2020  ? Bipolar depression ([place]) 12/28/2019  ? Sleep apnea 12/28/2019  ? Tachycardia 10/31/2019  ? Hyperlipidemia 10/30/2015  ? Morbid obesity ([place]) 10/30/2015  ? ? ?Conditions to be addressed/monitored:HLD and DMII ? ?Care Plan : RN Care Manager plan of care  ?Updates made  by Kassie Mends, RN since 05/09/2022 12:00 AM  ?  ? ?Problem: No plan of care established for management of chronic disease states- DM2, HLD   ?Priority: High  ?  ? ?Long-Range Goal: Development of plan of care for chronic disease management - DM2, HLD   ?Start Date: 11/08/2021  ?Expected End Date: 09/17/2022  ?Priority: High  ?Note:   ? ?Current Barriers:  ?Knowledge Deficits related to plan of care for management of HLD and DMII  ?Care Coordination needs related to Mental Health Concerns  and Lacks knowledge of community resource: Patient reports she has depression and is followed by psychiatrist and counselor regularly, declines services of CCM LCSW (PHQ9=10).  ?Chronic Disease Management support and education needs related to HLD and DMII- Patient lives with spouse, is mostly independent with ADL, IADL's, continues to drive, CBG now checked with Dexcom with fasting readings usually 120-130s range, random readings all under 180, pt reports CBG readings  improved over the past week since healing up from surgery, incision still has one small open area. Patient can benefit from education related to ADA/ carbohydrate modified diet.  Patient reports she does not

## 2022-05-09 NOTE — Patient Instructions (Addendum)
Visit Information ? ?Thank you for taking time to visit with me today. Please don't hesitate to contact me if I can be of assistance to you before our next scheduled telephone appointment. ? ?Following are the goals we discussed today:  ?Patient will attend all scheduled provider appointments as evidenced by clinician review of documented attendance to scheduled appointments and patient/caregiver report ?Patient will call provider office for new concerns or questions as evidenced by review of documented incoming telephone call notes and patient report ?Reviewed checking blood sugar as prescribed and record, take to doctor's visits ?Avoid/ limit saturated/ trans fats- broil or bake foods instead of frying- read labels ?You need adequate protein in your diet for healing (meat, chicken, fish, eggs, cheese, nuts) ?Practice portion control ?Get outside daily, try walking for exercise if you are able ?Keep stress under control, practice relaxation, try to get outdoors daily ?Follow carbohydrate modified diet/ plate method ?Continue working with your psychiatrist and counselor ?Continue to follow up with endocrinologist for any elevated blood sugars ? ?Our next appointment is by telephone on 07/18/22 at 1045 am ? ?Please call the care guide team at [phone] if you need to cancel or reschedule your appointment.  ? ?If you are experiencing a Mental Health or [place] or need someone to talk to, please call the Suicide and Crisis Lifeline: 988 ?call the [place] National Suicide Prevention Lifeline: 5014295638 or TTY: 938-072-1009 TTY [phone]) to talk to a trained counselor ?call 1-800-273-TALK (toll free, 24 hour hotline) ?go to [facility] [address], [place] [phone]) ?call 911  ? ?Patient verbalizes understanding of instructions and care plan provided today and agrees to view in [place]. Active MyChart status confirmed with patient.   ? ?Jacqlyn Larsen  RNC, BSN ?RN Case Manager ?[facility] ?[phone] ? ?

## 2022-05-17 ENCOUNTER — Other Ambulatory Visit (INDEPENDENT_AMBULATORY_CARE_PROVIDER_SITE_OTHER): Payer: Self-pay | Admitting: Endocrinology

## 2022-05-17 DIAGNOSIS — E1100 Type 2 diabetes mellitus with hyperosmolarity without nonketotic hyperglycemic-hyperosmolar coma (NKHHC): Secondary | ICD-10-CM

## 2022-05-23 ENCOUNTER — Encounter: Payer: Self-pay | Admitting: Endocrinology

## 2022-05-23 DIAGNOSIS — E1165 Type 2 diabetes mellitus with hyperglycemia: Secondary | ICD-10-CM

## 2022-05-24 MED ORDER — TOUJEO MAX SOLOSTAR 300 UNIT/ML ~~LOC~~ SOPN: 170.0000 [IU] | PEN_INJECTOR | SUBCUTANEOUS | 1 refills | Status: DC

## 2022-05-29 DIAGNOSIS — E1169 Type 2 diabetes mellitus with other specified complication: Secondary | ICD-10-CM | POA: Diagnosis not present

## 2022-05-29 DIAGNOSIS — E785 Hyperlipidemia, unspecified: Secondary | ICD-10-CM | POA: Diagnosis not present

## 2022-05-29 DIAGNOSIS — Z794 Long term (current) use of insulin: Secondary | ICD-10-CM | POA: Diagnosis not present

## 2022-06-03 ENCOUNTER — Encounter: Payer: Self-pay | Admitting: Internal Medicine

## 2022-06-03 ENCOUNTER — Ambulatory Visit (INDEPENDENT_AMBULATORY_CARE_PROVIDER_SITE_OTHER): Payer: Medicare Other | Admitting: Internal Medicine

## 2022-06-03 VITALS — BP 128/88 | HR 107 | Resp 18 | Ht 69.0 in | Wt 265.4 lb

## 2022-06-03 DIAGNOSIS — E1165 Type 2 diabetes mellitus with hyperglycemia: Secondary | ICD-10-CM

## 2022-06-03 DIAGNOSIS — R21 Rash and other nonspecific skin eruption: Secondary | ICD-10-CM

## 2022-06-03 DIAGNOSIS — J3089 Other allergic rhinitis: Secondary | ICD-10-CM | POA: Diagnosis not present

## 2022-06-03 DIAGNOSIS — I10 Essential (primary) hypertension: Secondary | ICD-10-CM

## 2022-06-03 MED ORDER — DESLORATADINE 5 MG PO TABS: 5.0000 mg | ORAL_TABLET | Freq: Every day | ORAL | 5 refills | Status: DC

## 2022-06-03 NOTE — Patient Instructions (Signed)
Please start taking Clarinex for allergies. Please perform sinus lavage for sinusitis.  Please continue to take other medications as prescribed.  Please continue to follow low carb diet and ambulate as tolerated.

## 2022-06-03 NOTE — Progress Notes (Signed)
Established Patient Office Visit  Subjective:  Patient ID: Sydney Taylor, female    DOB: [birth year]  Age: 56 y.o. MRN: [id number]  CC:  Chief Complaint  Patient presents with   Follow-up    Follow up DM pt still has red patches on legs and has been having headaches the last few weeks     HPI Sydney Taylor is a 56 y.o. female with past medical history of OSA, GERD, type II DM with diabetic neuropathy, HLD, tachycardia, depression with anxiety, fibromyalgia and obesity who presents for f/u of her chronic medical conditions.  Type II DM: She is on Toujeo 170 units nightly, metformin 1000 mg daily and Jardiance 10 mg daily.  For followed by endocrinology.  She has CGM now.  She has chronic fatigue, but denies any polyuria or polyphagia.  She complains of frontal headache, which is different than her migraine.  Of note, she has chronic nasal congestion and postnasal drip from allergic sinusitis.  She uses Flonase and follows up with ENT specialist for it.  She is still complains of red patches over her thigh area, but denies any intense itching.    Past Medical History:  Diagnosis Date   Anemia    Anxiety    Arrhythmia    Bipolar depression ([place]) 12/28/2019   Chronic fatigue    Chronic headaches    Depression    Diabetes mellitus without complication (HCC)    Fibromyalgia    GERD (gastroesophageal reflux disease)    High cholesterol    HLD (hyperlipidemia)    Hypertension    Insect bite    Migraine    Prediabetes    Sleep apnea    Urinary incontinence     Past Surgical History:  Procedure Laterality Date   CHOLECYSTECTOMY N/A 03/15/2022   Procedure: LAPAROSCOPIC CHOLECYSTECTOMY WITH INTRAOPERATIVE CHOLANGIOGRAM;  Surgeon: Virl Cagey, MD;  Location: [facility];  Service: General;  Laterality: N/A;   COLONOSCOPY  2017   per patient normal and needs next one in 2027 (Dr. Posey Pronto)   ESOPHAGOGASTRODUODENOSCOPY N/A 05/12/2020   Procedure: ESOPHAGOGASTRODUODENOSCOPY  (EGD);  Surgeon: Daneil Dolin, MD;  Location: [facility];  Service: Endoscopy;  Laterality: N/A;  11:15am   Joint fusion on foot Right    LIVER BIOPSY N/A 03/15/2022   Procedure: LIVER BIOPSY;  Surgeon: Virl Cagey, MD;  Location: [facility];  Service: General;  Laterality: N/A;   MALONEY DILATION N/A 05/12/2020   Procedure: Keturah Shavers;  Surgeon: Daneil Dolin, MD;  Location: [facility];  Service: Endoscopy;  Laterality: N/A;   NASAL SEPTUM SURGERY     SINOSCOPY      Family History  Problem Relation Age of Onset   Hypertension Father    Hyperlipidemia Father    Diabetes Father    Stroke Father    Depression Father    Anxiety disorder Father    Hypertension Mother    Hyperlipidemia Mother    Diabetes Mellitus II Mother    Congestive Heart Failure Mother    Heart attack Mother    Hypertension Sister    Diabetes Sister    Hypertension Brother    Autoimmune disease Brother    Diabetes Maternal Grandmother    Lupus Paternal Grandmother    Depression Paternal Grandmother    Anxiety disorder Paternal Grandmother    Depression Maternal Aunt    Anxiety disorder Maternal Aunt    Colon cancer Neg Hx    Esophageal cancer Neg  Hx    Gastric cancer Neg Hx     Social History   Socioeconomic History   Marital status: Married    Spouse name: Not on file   Number of children: 0   Years of education: Not on file   Highest education level: Bachelor's degree (e.g., BA, AB, BS)  Occupational History   Occupation: research associate/drug development  Tobacco Use   Smoking status: Former    Types: Cigarettes    Quit date: 12/30/1982    Years since quitting: 39.4   Smokeless tobacco: Never  Vaping Use   Vaping Use: Never used  Substance and Sexual Activity   Alcohol use: Not Currently    Alcohol/week: 0.0 standard drinks of alcohol   Drug use: Never   Sexual activity: Yes    Birth control/protection: Post-menopausal  Other Topics Concern   Not on file  Social  History Narrative   Married for 3 years.Lives with husband.Bachelors in Education officer, museum.   Right handed   Caffeine: 1-2 per day    Social Determinants of Health   Financial Resource Strain: Low Risk  (06/04/2022)   Overall Financial Resource Strain (CARDIA)    Difficulty of Paying Living Expenses: Not hard at all  Food Insecurity: No Food Insecurity (06/04/2022)   Hunger Vital Sign    Worried About Running Out of Food in the Last Year: Never true    Ran Out of Food in the Last Year: Never true  Transportation Needs: No Transportation Needs (06/04/2022)   PRAPARE - Hydrologist (Medical): No    Lack of Transportation (Non-Medical): No  Physical Activity: Inactive (06/04/2022)   Exercise Vital Sign    Days of Exercise per Week: 0 days    Minutes of Exercise per Session: 0 min  Stress: No Stress Concern Present (06/04/2022)   [place]    Feeling of Stress : Not at all  Social Connections: Moderately Integrated (06/04/2022)   Social Connection and Isolation Panel [NHANES]    Frequency of Communication with Friends and Family: More than three times a week    Frequency of Social Gatherings with Friends and Family: More than three times a week    Attends Religious Services: More than 4 times per year    Active Member of [employer] or Organizations: No    Attends Archivist Meetings: Never    Marital Status: Married  Human resources officer Violence: Not At Risk (06/04/2022)   Humiliation, Afraid, Rape, and Kick questionnaire    Fear of Current or Ex-Partner: No    Emotionally Abused: No    Physically Abused: No    Sexually Abused: No    Outpatient Medications Prior to Visit  Medication Sig Dispense Refill   ALPRAZolam (XANAX) 0.5 MG tablet Take 0.25 mg by mouth daily as needed for anxiety.     Aspirin-Caffeine (BC FAST PAIN RELIEF PO) Take 1 packet by mouth daily as needed (pain).     B-12 Methylcobalamin  1000 MCG TBDP Take 1,000 mcg by mouth daily.     blood glucose meter kit and supplies KIT Inject 1 each into the skin daily. Check blood glucose once a day. Diagnosis code: E 11.9. 1 each 0   buPROPion ER (WELLBUTRIN SR) 100 MG 12 hr tablet Take 100 mg by mouth daily.     calcium carbonate (TUMS - DOSED IN MG ELEMENTAL CALCIUM) 500 MG chewable tablet Chew 1,000 mg by mouth daily as  needed for indigestion or heartburn.     chlorthalidone (HYGROTON) 25 MG tablet Take 0.5 tablets (12.5 mg total) by mouth daily. 45 tablet 1   Cholecalciferol (VITAMIN D3) 125 MCG (5000 UT) TABS Take 2 tablets (10,000 Units total) by mouth daily. 180 tablet 1   Continuous Blood Gluc Sensor (DEXCOM G6 SENSOR) MISC by Does not apply route.     Continuous Blood Gluc Transmit (DEXCOM G6 TRANSMITTER) MISC by Does not apply route.     DULoxetine (CYMBALTA) 60 MG capsule Take 60 mg by mouth every morning.     empagliflozin (JARDIANCE) 10 MG TABS tablet TAKE 1 TABLET(10 MG) BY MOUTH DAILY BEFORE BREAKFAST 30 tablet 2   glucose blood (ACCU-CHEK GUIDE) test strip 1 each by Other route in the morning, at noon, and at bedtime. Use as instructed 300 each 3   ibuprofen (ADVIL) 200 MG tablet Take 600 mg by mouth every 6 (six) hours as needed for moderate pain or headache.     insulin glargine, 2 Unit Dial, (TOUJEO MAX SOLOSTAR) 300 UNIT/ML Solostar Pen Inject 170 Units into the skin every morning. And pen needles 1/day 51 mL 1   Insulin Pen Needle (PEN NEEDLES) 32G X 4 MM MISC Check BS 2 x a day 100 each 5   KLOR-CON M20 20 MEQ tablet TAKE 2 TABLETS BY MOUTH TWICE A DAY (Patient taking differently: Take 40 mEq by mouth daily.) 360 tablet 1   L-Methylfolate 15 MG TABS Take 15 mg by mouth daily at 6 (six) AM.     losartan (COZAAR) 25 MG tablet TAKE 1 TABLET(25 MG) BY MOUTH DAILY 90 tablet 0   metFORMIN (GLUCOPHAGE-XR) 500 MG 24 hr tablet Take 2 tablets (1,000 mg total) by mouth daily with breakfast. (Patient taking differently: Take 1,000  mg by mouth daily with supper.) 180 tablet 3   metroNIDAZOLE (METROGEL) 0.75 % gel Apply 1 application. topically 2 (two) times daily. 45 g 0   Multiple Vitamins-Minerals (MULTIVITAMIN WOMEN 50+) TABS Take 1 tablet by mouth daily.     ondansetron (ZOFRAN) 4 MG tablet Take 1 tablet (4 mg total) by mouth every 8 (eight) hours as needed. 30 tablet 1   Probiotic Product (PROBIOTIC PO) Take 1 capsule by mouth daily.     rosuvastatin (CRESTOR) 10 MG tablet Take 1 tablet (10 mg total) by mouth daily. (Patient taking differently: Take 10 mg by mouth every other day.) 30 tablet 11   SUMAtriptan (IMITREX) 50 MG tablet MAY REPEAT IN 2 HOURS IF HEADACHE PERSISTS OR RECURS. MAXIMUM 2 PER DAY. 10 tablet 0   traZODone (DESYREL) 50 MG tablet Take 12.5 mg by mouth at bedtime.     pantoprazole (PROTONIX) 40 MG tablet Take 1 tablet (40 mg total) by mouth daily. 90 tablet 0   metoprolol tartrate (LOPRESSOR) 25 MG tablet Take 1 tablet (25 mg total) by mouth 2 (two) times daily. 180 tablet 3   DULoxetine (CYMBALTA) 30 MG capsule Take 30 mg by mouth daily in the afternoon. (Patient not taking: Reported on 05/09/2022)     fluconazole (DIFLUCAN) 150 MG tablet Take 2 tablets (300 mg total) by mouth every 7 (seven) days. 8 tablet 0   fluticasone (FLONASE) 50 MCG/ACT nasal spray Place 2 sprays into both nostrils daily. 16 g 6   glipiZIDE (GLUCOTROL) 10 MG tablet TAKE 1 TABLET BY MOUTH TWICE A DAY BEFORE A MEAL 180 tablet 0   insulin lispro (HUMALOG) 100 UNIT/ML injection Inject 10 Units into the skin  daily with supper 15 mL 1   oxyCODONE (ROXICODONE) 5 MG immediate release tablet Take 1 tablet (5 mg total) by mouth every 4 (four) hours as needed for severe pain or breakthrough pain. (Patient not taking: Reported on 05/01/2022) 20 tablet 0   PREDNISOLON-MOXIFLOX-BROMFENAC OP Place 1 drop into the right eye daily. (Patient not taking: Reported on 05/01/2022)     No facility-administered medications prior to visit.    Allergies   Allergen Reactions   Cephalexin Itching, Swelling and Other (See Comments)    ROS Review of Systems  Constitutional:  Negative for chills and fever.  HENT:  Positive for postnasal drip, sinus pressure and sinus pain. Negative for ear discharge.   Respiratory:  Positive for shortness of breath. Negative for wheezing.   Cardiovascular:  Negative for chest pain and palpitations.  Gastrointestinal:  Negative for diarrhea and vomiting.  Genitourinary:  Negative for dysuria and hematuria.  Musculoskeletal:  Negative for neck pain and neck stiffness.  Skin:  Positive for rash.  Neurological:  Positive for headaches. Negative for dizziness and weakness.  Psychiatric/Behavioral:  Negative for agitation and behavioral problems.       Objective:    Physical Exam Constitutional:      General: She is not in acute distress.    Appearance: She is obese. She is not diaphoretic.  HENT:     Head: Normocephalic and atraumatic.     Nose: Congestion present.     Mouth/Throat:     Mouth: Mucous membranes are dry.     Pharynx: No posterior oropharyngeal erythema.  Eyes:     General: No scleral icterus.    Extraocular Movements: Extraocular movements intact.  Cardiovascular:     Rate and Rhythm: Normal rate and regular rhythm.     Pulses: Normal pulses.     Heart sounds: Normal heart sounds. No murmur heard. Pulmonary:     Breath sounds: Normal breath sounds. No wheezing or rales.  Musculoskeletal:     Cervical back: Neck supple. No tenderness.     Right lower leg: Edema (Trace) present.     Left lower leg: Edema (Trace) present.  Skin:    General: Skin is dry.     Coloration: Skin is not pale.     Findings: Rash (Multiple depigmented areas - circular/oval over b/l thigh and axilla, right leg) present.  Neurological:     General: No focal deficit present.     Mental Status: She is alert and oriented to person, place, and time.     Sensory: No sensory deficit.     Motor: No weakness.   Psychiatric:        Mood and Affect: Mood is depressed. Affect is flat.        Behavior: Behavior normal.        Thought Content: Thought content normal.     BP 128/88 (BP Location: Right Arm, Patient Position: Sitting, Cuff Size: Normal)   Pulse (!) 107   Resp 18   Ht 5' 9"  (1.753 m)   Wt 265 lb 6.4 oz (120.4 kg)   LMP  (LMP Unknown)   SpO2 96%   BMI 39.19 kg/m  Wt Readings from Last 3 Encounters:  06/03/22 265 lb 6.4 oz (120.4 kg)  04/25/22 269 lb (122 kg)  04/17/22 264 lb 3.2 oz (119.8 kg)    Lab Results  Component Value Date   TSH 1.56 03/29/2022   Lab Results  Component Value Date   WBC 11.1 (H) 03/29/2022  HGB 13.5 03/29/2022   HCT 41.5 03/29/2022   MCV 89.2 03/29/2022   PLT 314 03/29/2022   Lab Results  Component Value Date   NA 139 03/29/2022   K 3.9 03/29/2022   CO2 27 03/29/2022   GLUCOSE 273 (H) 03/29/2022   BUN 18 03/29/2022   CREATININE 1.03 03/29/2022   BILITOT 0.4 03/29/2022   ALKPHOS 66 03/15/2022   AST 14 03/29/2022   ALT 18 03/29/2022   PROT 6.0 (L) 03/29/2022   ALBUMIN 3.4 (L) 03/15/2022   CALCIUM 9.4 03/29/2022   ANIONGAP 10 03/15/2022   EGFR 56 (L) 12/14/2021   Lab Results  Component Value Date   CHOL 136 12/14/2021   Lab Results  Component Value Date   HDL 36 (L) 12/14/2021   Lab Results  Component Value Date   LDLCALC 68 12/14/2021   Lab Results  Component Value Date   TRIG 189 (H) 12/14/2021   Lab Results  Component Value Date   CHOLHDL 3.8 12/14/2021   Lab Results  Component Value Date   HGBA1C 8.0 (A) 03/12/2022      Assessment & Plan:   Problem List Items Addressed This Visit       Cardiovascular and Mediastinum   Essential hypertension    BP Readings from Last 1 Encounters:  06/03/22 128/88  Well-controlled with losartan 25 mg QD and Metoprolol 25 mg BID Counseled for compliance with the medications Advised DASH diet and moderate exercise/walking, at least 150 mins/week        Respiratory    Perennial allergic rhinitis    Her frontal headache is likely due to allergic sinusitis Started Clarinex Uses Flonase Has been seen by ENT specialist      Relevant Medications   desloratadine (CLARINEX) 5 MG tablet     Endocrine   Type 2 diabetes mellitus ([place]) - Primary     Lab Results  Component Value Date   HGBA1C 8.0 (A) 03/12/2022  On glipizide and Trulicity On metformin XR and Toujeo 170 units nightly Followed by endocrinology Advised to follow diabetic diet On ARB and statin F/u CMP and lipid panel Diabetic eye exam: Advised to follow up with Ophthalmology for diabetic eye exam        Musculoskeletal and Integument   Rash and nonspecific skin eruption    Was treated for tinea versicolor, has persistent rash Advised to discuss with dermatology       Meds ordered this encounter  Medications   desloratadine (CLARINEX) 5 MG tablet    Sig: Take 1 tablet (5 mg total) by mouth daily.    Dispense:  30 tablet    Refill:  5    Follow-up: Return in about 4 months (around 10/03/2022) for DM and HTN.    Lindell Spar, MD

## 2022-06-04 ENCOUNTER — Encounter: Payer: Self-pay | Admitting: Internal Medicine

## 2022-06-04 ENCOUNTER — Ambulatory Visit (INDEPENDENT_AMBULATORY_CARE_PROVIDER_SITE_OTHER): Payer: Medicare Other | Admitting: *Deleted

## 2022-06-04 ENCOUNTER — Other Ambulatory Visit: Payer: Self-pay | Admitting: *Deleted

## 2022-06-04 DIAGNOSIS — Z0000 Encounter for general adult medical examination without abnormal findings: Secondary | ICD-10-CM

## 2022-06-04 MED ORDER — PANTOPRAZOLE SODIUM 40 MG PO TBEC: 40.0000 mg | DELAYED_RELEASE_TABLET | Freq: Every day | ORAL | 0 refills | Status: DC

## 2022-06-04 NOTE — Patient Instructions (Signed)
Sydney Taylor , Thank you for taking time to come for your Medicare Wellness Visit. I appreciate your ongoing commitment to your health goals. Please review the following plan we discussed and let me know if I can assist you in the future.   Screening recommendations/referrals: Colonoscopy: Completed Mammogram: Completed Recommended yearly ophthalmology/optometry visit for glaucoma screening and checkup Recommended yearly dental visit for hygiene and checkup  Vaccinations: Influenza vaccine: Completed Tdap vaccine: Completed  Shingles vaccine: completed    Advanced directives: copy requested  Conditions/risks identified: hypertension, diabetes  Next appointment: 1 year  Preventive Care 40-64 Years, Female Preventive care refers to lifestyle choices and visits with your health care provider that can promote health and wellness. What does preventive care include? A yearly physical exam. This is also called an annual well check. Dental exams once or twice a year. Routine eye exams. Ask your health care provider how often you should have your eyes checked. Personal lifestyle choices, including: Daily care of your teeth and gums. Regular physical activity. Eating a healthy diet. Avoiding tobacco and drug use. Limiting alcohol use. Practicing safe sex. Taking low-dose aspirin daily starting at age 27. Taking vitamin and mineral supplements as recommended by your health care provider. What happens during an annual well check? The services and screenings done by your health care provider during your annual well check will depend on your age, overall health, lifestyle risk factors, and family history of disease. Counseling  Your health care provider may ask you questions about your: Alcohol use. Tobacco use. Drug use. Emotional well-being. Home and relationship well-being. Sexual activity. Eating habits. Work and work Statistician. Method of birth control. Menstrual  cycle. Pregnancy history. Screening  You may have the following tests or measurements: Height, weight, and BMI. Blood pressure. Lipid and cholesterol levels. These may be checked every 5 years, or more frequently if you are over 12 years old. Skin check. Lung cancer screening. You may have this screening every year starting at age 74 if you have a 30-pack-year history of smoking and currently smoke or have quit within the past 15 years. Fecal occult blood test (FOBT) of the stool. You may have this test every year starting at age 6. Flexible sigmoidoscopy or colonoscopy. You may have a sigmoidoscopy every 5 years or a colonoscopy every 10 years starting at age 60. Hepatitis C blood test. Hepatitis B blood test. Sexually transmitted disease (STD) testing. Diabetes screening. This is done by checking your blood sugar (glucose) after you have not eaten for a while (fasting). You may have this done every 1-3 years. Mammogram. This may be done every 1-2 years. Talk to your health care provider about when you should start having regular mammograms. This may depend on whether you have a family history of breast cancer. BRCA-related cancer screening. This may be done if you have a family history of breast, ovarian, tubal, or peritoneal cancers. Pelvic exam and Pap test. This may be done every 3 years starting at age 41. Starting at age 71, this may be done every 5 years if you have a Pap test in combination with an HPV test. Bone density scan. This is done to screen for osteoporosis. You may have this scan if you are at high risk for osteoporosis. Discuss your test results, treatment options, and if necessary, the need for more tests with your health care provider. Vaccines  Your health care provider may recommend certain vaccines, such as: Influenza vaccine. This is recommended every year. Tetanus, diphtheria,  and acellular pertussis (Tdap, Td) vaccine. You may need a Td booster every 10  years. Zoster vaccine. You may need this after age 41. Pneumococcal 13-valent conjugate (PCV13) vaccine. You may need this if you have certain conditions and were not previously vaccinated. Pneumococcal polysaccharide (PPSV23) vaccine. You may need one or two doses if you smoke cigarettes or if you have certain conditions. Talk to your health care provider about which screenings and vaccines you need and how often you need them. This information is not intended to replace advice given to you by your health care provider. Make sure you discuss any questions you have with your health care provider. Document Released: 01/12/2016 Document Revised: 09/04/2016 Document Reviewed: 10/17/2015 Elsevier Interactive Patient Education  2017 [employer] Prevention in the Home Falls can cause injuries. They can happen to people of all ages. There are many things you can do to make your home safe and to help prevent falls. What can I do on the outside of my home? Regularly fix the edges of walkways and driveways and fix any cracks. Remove anything that might make you trip as you walk through a door, such as a raised step or threshold. Trim any bushes or trees on the path to your home. Use bright outdoor lighting. Clear any walking paths of anything that might make someone trip, such as rocks or tools. Regularly check to see if handrails are loose or broken. Make sure that both sides of any steps have handrails. Any raised decks and porches should have guardrails on the edges. Have any leaves, snow, or ice cleared regularly. Use sand or salt on walking paths during winter. Clean up any spills in your garage right away. This includes oil or grease spills. What can I do in the bathroom? Use night lights. Install grab bars by the toilet and in the tub and shower. Do not use towel bars as grab bars. Use non-skid mats or decals in the tub or shower. If you need to sit down in the shower, use a  plastic, non-slip stool. Keep the floor dry. Clean up any water that spills on the floor as soon as it happens. Remove soap buildup in the tub or shower regularly. Attach bath mats securely with double-sided non-slip rug tape. Do not have throw rugs and other things on the floor that can make you trip. What can I do in the bedroom? Use night lights. Make sure that you have a light by your bed that is easy to reach. Do not use any sheets or blankets that are too big for your bed. They should not hang down onto the floor. Have a firm chair that has side arms. You can use this for support while you get dressed. Do not have throw rugs and other things on the floor that can make you trip. What can I do in the kitchen? Clean up any spills right away. Avoid walking on wet floors. Keep items that you use a lot in easy-to-reach places. If you need to reach something above you, use a strong step stool that has a grab bar. Keep electrical cords out of the way. Do not use floor polish or wax that makes floors slippery. If you must use wax, use non-skid floor wax. Do not have throw rugs and other things on the floor that can make you trip. What can I do with my stairs? Do not leave any items on the stairs. Make sure that there are  handrails on both sides of the stairs and use them. Fix handrails that are broken or loose. Make sure that handrails are as long as the stairways. Check any carpeting to make sure that it is firmly attached to the stairs. Fix any carpet that is loose or worn. Avoid having throw rugs at the top or bottom of the stairs. If you do have throw rugs, attach them to the floor with carpet tape. Make sure that you have a light switch at the top of the stairs and the bottom of the stairs. If you do not have them, ask someone to add them for you. What else can I do to help prevent falls? Wear shoes that: Do not have high heels. Have rubber bottoms. Are comfortable and fit you  well. Are closed at the toe. Do not wear sandals. If you use a stepladder: Make sure that it is fully opened. Do not climb a closed stepladder. Make sure that both sides of the stepladder are locked into place. Ask someone to hold it for you, if possible. Clearly mark and make sure that you can see: Any grab bars or handrails. First and last steps. Where the edge of each step is. Use tools that help you move around (mobility aids) if they are needed. These include: Canes. Walkers. Scooters. Crutches. Turn on the lights when you go into a dark area. Replace any light bulbs as soon as they burn out. Set up your furniture so you have a clear path. Avoid moving your furniture around. If any of your floors are uneven, fix them. If there are any pets around you, be aware of where they are. Review your medicines with your doctor. Some medicines can make you feel dizzy. This can increase your chance of falling. Ask your doctor what other things that you can do to help prevent falls. This information is not intended to replace advice given to you by your health care provider. Make sure you discuss any questions you have with your health care provider. Document Released: 10/12/2009 Document Revised: 05/23/2016 Document Reviewed: 01/20/2015 Elsevier Interactive Patient Education  2017 [employer].

## 2022-06-04 NOTE — Progress Notes (Signed)
Subjective:   Sydney Taylor is a 56 y.o. female who presents for Medicare Annual (Subsequent) preventive examination.  I connected with  Sydney Taylor on 06/04/22 by a audio enabled telemedicine application and verified that I am speaking with the correct person using two identifiers.  Patient Location: Home  Provider Location: Home Office  I discussed the limitations of evaluation and management by telemedicine. The patient expressed understanding and agreed to proceed.   Review of Systems     Sydney Taylor , Thank you for taking time to come for your Medicare Wellness Visit. I appreciate your ongoing commitment to your health goals. Please review the following plan we discussed and let me know if I can assist you in the future.   These are the goals we discussed:  Goals      Medication Management     Patient Goals/Self-Care Activities Patient will:  Take medications as prescribed Check blood sugar continuously with continuous glucose monitor, document, and provide at future appointments Engage in dietary modifications by less frequent dining out and fewer sweetened foods & beverages            This is a list of the screening recommended for you and due dates:  Health Maintenance  Topic Date Due   Eye exam for diabetics  06/11/2022   Flu Shot  07/30/2022   Hemoglobin A1C  09/12/2022   Complete foot exam   10/11/2022   Pap Smear  09/30/2023   Mammogram  01/23/2024   Colon Cancer Screening  08/30/2026   Tetanus Vaccine  12/30/2030   COVID-19 Vaccine  Completed   Hepatitis C Screening: USPSTF Recommendation to screen - Ages 18-79 yo.  Completed   HIV Screening  Completed   Zoster (Shingles) Vaccine  Completed   HPV Vaccine  Aged Out          Objective:    There were no vitals filed for this visit. There is no height or weight on file to calculate BMI.     03/15/2022    6:26 AM 02/10/2022    7:51 PM 11/08/2021   11:44 AM 05/30/2021    2:04 PM  07/20/2020    3:01 PM 07/12/2020    4:15 PM 07/11/2020    6:46 PM  Advanced Directives  Does Patient Have a Medical Advance Directive? No No Yes No No  No  Type of Comptroller;Living will      Does patient want to make changes to medical advance directive?   No - Patient declined      Copy of [place] in Chart?   No - copy requested      Would patient like information on creating a medical advance directive? No - Patient declined No - Patient declined  No - Patient declined        Information is confidential and restricted. Go to Review Flowsheets to unlock data.     Current Medications (verified) Outpatient Encounter Medications as of 06/04/2022  Medication Sig   ALPRAZolam (XANAX) 0.5 MG tablet Take 0.25 mg by mouth daily as needed for anxiety.   Aspirin-Caffeine (BC FAST PAIN RELIEF PO) Take 1 packet by mouth daily as needed (pain).   B-12 Methylcobalamin 1000 MCG TBDP Take 1,000 mcg by mouth daily.   blood glucose meter kit and supplies KIT Inject 1 each into the skin daily. Check blood glucose once a day. Diagnosis code: E 11.9.   buPROPion  ER (WELLBUTRIN SR) 100 MG 12 hr tablet Take 100 mg by mouth daily.   calcium carbonate (TUMS - DOSED IN MG ELEMENTAL CALCIUM) 500 MG chewable tablet Chew 1,000 mg by mouth daily as needed for indigestion or heartburn.   chlorthalidone (HYGROTON) 25 MG tablet Take 0.5 tablets (12.5 mg total) by mouth daily.   Cholecalciferol (VITAMIN D3) 125 MCG (5000 UT) TABS Take 2 tablets (10,000 Units total) by mouth daily.   Continuous Blood Gluc Sensor (DEXCOM G6 SENSOR) MISC by Does not apply route.   Continuous Blood Gluc Transmit (DEXCOM G6 TRANSMITTER) MISC by Does not apply route.   desloratadine (CLARINEX) 5 MG tablet Take 1 tablet (5 mg total) by mouth daily.   DULoxetine (CYMBALTA) 60 MG capsule Take 60 mg by mouth every morning.   empagliflozin (JARDIANCE) 10 MG TABS tablet TAKE 1 TABLET(10 MG) BY  MOUTH DAILY BEFORE BREAKFAST   glucose blood (ACCU-CHEK GUIDE) test strip 1 each by Other route in the morning, at noon, and at bedtime. Use as instructed   ibuprofen (ADVIL) 200 MG tablet Take 600 mg by mouth every 6 (six) hours as needed for moderate pain or headache.   insulin glargine, 2 Unit Dial, (TOUJEO MAX SOLOSTAR) 300 UNIT/ML Solostar Pen Inject 170 Units into the skin every morning. And pen needles 1/day   Insulin Pen Needle (PEN NEEDLES) 32G X 4 MM MISC Check BS 2 x a day   KLOR-CON M20 20 MEQ tablet TAKE 2 TABLETS BY MOUTH TWICE A DAY (Patient taking differently: Take 40 mEq by mouth daily.)   L-Methylfolate 15 MG TABS Take 15 mg by mouth daily at 6 (six) AM.   losartan (COZAAR) 25 MG tablet TAKE 1 TABLET(25 MG) BY MOUTH DAILY   metFORMIN (GLUCOPHAGE-XR) 500 MG 24 hr tablet Take 2 tablets (1,000 mg total) by mouth daily with breakfast. (Patient taking differently: Take 1,000 mg by mouth daily with supper.)   metoprolol tartrate (LOPRESSOR) 25 MG tablet Take 1 tablet (25 mg total) by mouth 2 (two) times daily.   metroNIDAZOLE (METROGEL) 0.75 % gel Apply 1 application. topically 2 (two) times daily.   Multiple Vitamins-Minerals (MULTIVITAMIN WOMEN 50+) TABS Take 1 tablet by mouth daily.   ondansetron (ZOFRAN) 4 MG tablet Take 1 tablet (4 mg total) by mouth every 8 (eight) hours as needed.   pantoprazole (PROTONIX) 40 MG tablet Take 1 tablet (40 mg total) by mouth daily.   Probiotic Product (PROBIOTIC PO) Take 1 capsule by mouth daily.   rosuvastatin (CRESTOR) 10 MG tablet Take 1 tablet (10 mg total) by mouth daily. (Patient taking differently: Take 10 mg by mouth every other day.)   SUMAtriptan (IMITREX) 50 MG tablet MAY REPEAT IN 2 HOURS IF HEADACHE PERSISTS OR RECURS. MAXIMUM 2 PER DAY.   traZODone (DESYREL) 50 MG tablet Take 12.5 mg by mouth at bedtime.   No facility-administered encounter medications on file as of 06/04/2022.    Allergies (verified) Cephalexin   History: Past  Medical History:  Diagnosis Date   Anemia    Anxiety    Arrhythmia    Bipolar depression ([place]) 12/28/2019   Chronic fatigue    Chronic headaches    Depression    Diabetes mellitus without complication (HCC)    Fibromyalgia    GERD (gastroesophageal reflux disease)    High cholesterol    HLD (hyperlipidemia)    Hypertension    Insect bite    Migraine    Prediabetes    Sleep apnea  Urinary incontinence    Past Surgical History:  Procedure Laterality Date   CHOLECYSTECTOMY N/A 03/15/2022   Procedure: LAPAROSCOPIC CHOLECYSTECTOMY WITH INTRAOPERATIVE CHOLANGIOGRAM;  Surgeon: Virl Cagey, MD;  Location: [facility];  Service: General;  Laterality: N/A;   COLONOSCOPY  2017   per patient normal and needs next one in 2027 (Dr. Posey Pronto)   ESOPHAGOGASTRODUODENOSCOPY N/A 05/12/2020   Procedure: ESOPHAGOGASTRODUODENOSCOPY (EGD);  Surgeon: Daneil Dolin, MD;  Location: [facility];  Service: Endoscopy;  Laterality: N/A;  11:15am   Joint fusion on foot Right    LIVER BIOPSY N/A 03/15/2022   Procedure: LIVER BIOPSY;  Surgeon: Virl Cagey, MD;  Location: [facility];  Service: General;  Laterality: N/A;   MALONEY DILATION N/A 05/12/2020   Procedure: Keturah Shavers;  Surgeon: Daneil Dolin, MD;  Location: [facility];  Service: Endoscopy;  Laterality: N/A;   NASAL SEPTUM SURGERY     SINOSCOPY     Family History  Problem Relation Age of Onset   Hypertension Father    Hyperlipidemia Father    Diabetes Father    Stroke Father    Depression Father    Anxiety disorder Father    Hypertension Mother    Hyperlipidemia Mother    Diabetes Mellitus II Mother    Congestive Heart Failure Mother    Heart attack Mother    Hypertension Sister    Diabetes Sister    Hypertension Brother    Autoimmune disease Brother    Diabetes Maternal Grandmother    Lupus Paternal Grandmother    Depression Paternal Grandmother    Anxiety disorder Paternal Grandmother    Depression Maternal Aunt     Anxiety disorder Maternal Aunt    Colon cancer Neg Hx    Esophageal cancer Neg Hx    Gastric cancer Neg Hx    Social History   Socioeconomic History   Marital status: Married    Spouse name: Not on file   Number of children: 0   Years of education: Not on file   Highest education level: Bachelor's degree (e.g., BA, AB, BS)  Occupational History   Occupation: research associate/drug development  Tobacco Use   Smoking status: Former    Types: Cigarettes    Quit date: 12/30/1982    Years since quitting: 39.4   Smokeless tobacco: Never  Vaping Use   Vaping Use: Never used  Substance and Sexual Activity   Alcohol use: Not Currently    Alcohol/week: 0.0 standard drinks   Drug use: Never   Sexual activity: Yes    Birth control/protection: Post-menopausal  Other Topics Concern   Not on file  Social History Narrative   Married for 3 years.Lives with husband.Bachelors in Education officer, museum.   Right handed   Caffeine: 1-2 per day    Social Determinants of Health   Financial Resource Strain: Not on file  Food Insecurity: No Food Insecurity   Worried About Charity fundraiser in the Last Year: Never true   Ran Out of Food in the Last Year: Never true  Transportation Needs: No Transportation Needs   Lack of Transportation (Medical): No   Lack of Transportation (Non-Medical): No  Physical Activity: Not on file  Stress: Not on file  Social Connections: Not on file    Tobacco Counseling Counseling given: Not Answered   Clinical Intake:                 Diabetic?Nutrition Risk Assessment:  Has the patient had any N/V/D  within the last 2 months?  No  Does the patient have any non-healing wounds?  No  Has the patient had any unintentional weight loss or weight gain?  No   Diabetes:  Is the patient diabetic?  Yes  If diabetic, was a CBG obtained today?  No  Did the patient bring in their glucometer from home?  No  How often do you monitor your CBG's? Dexcome continuous  monitor.   Financial Strains and Diabetes Management:  Are you having any financial strains with the device, your supplies or your medication? No .  Does the patient want to be seen by Chronic Care Management for management of their diabetes?  No  Would the patient like to be referred to a Nutritionist or for Diabetic Management?  No   Diabetic Exams:  Diabetic Eye Exam: Completed 06-11-21. Overdue for diabetic eye exam. Pt has been advised about the importance in completing this exam. A referral has been placed today. Message sent to referral coordinator for scheduling purposes. Advised pt to expect a call from office referred to regarding appt.  Diabetic Foot Exam: Completed 10-11-21.           Activities of Daily Living    03/13/2022    8:44 AM 03/13/2022    8:43 AM  In your present state of health, do you have any difficulty performing the following activities:  Hearing? 0   Vision? 0   Difficulty concentrating or making decisions? 0   Dressing or bathing? 0   Doing errands, shopping?  0    Patient Care Team: Lindell Spar, MD as PCP - General (Internal Medicine) Fay Records, MD as PCP - Cardiology (Cardiology) Gala Romney Cristopher Estimable, MD as Consulting Physician (Gastroenterology) Beryle Lathe, [facility] (Inactive) (Pharmacist) Kassie Mends, RN as Case Manager  Indicate any recent Medical Services you may have received from other than [facility] providers in the past year (date may be approximate).     Assessment:   This is a routine wellness examination for [place].  Hearing/Vision screen No results found.  Dietary issues and exercise activities discussed:     Goals Addressed   None   Depression Screen    06/03/2022   11:12 AM 05/01/2022    9:00 AM 04/17/2022    2:29 PM 02/12/2022    8:34 AM 02/01/2022   11:28 AM 01/14/2022    2:04 PM 12/27/2021   10:24 AM  PHQ 2/9 Scores  PHQ - 2 Score 3 2 0 3 3 1 3   PHQ- 9 Score 10 14  6 16 1 12     Fall Risk    06/03/2022    11:12 AM 05/01/2022    9:00 AM 04/17/2022    2:29 PM 03/04/2022   10:49 AM 02/12/2022    8:33 AM  Fall Risk   Falls in the past year? 0 0 0 0 0  Number falls in past yr: 0 0 0  0  Injury with Fall? 0 0 0  0  Risk for fall due to : No Fall Risks No Fall Risks No Fall Risks  No Fall Risks  Follow up Falls evaluation completed Falls evaluation completed Falls evaluation completed Falls evaluation completed Falls evaluation completed    FALL RISK PREVENTION PERTAINING TO THE HOME:  Any stairs in or around the home? Yes  If so, are there any without handrails? Yes  Home free of loose throw rugs in walkways, pet beds, electrical cords, etc? Yes  Adequate  lighting in your home to reduce risk of falls? Yes   ASSISTIVE DEVICES UTILIZED TO PREVENT FALLS:  Life alert? No  Use of a cane, walker or w/c? No  Grab bars in the bathroom? No  Shower chair or bench in shower? Yes  Elevated toilet seat or a handicapped toilet? No   Cognitive Function:        05/30/2021    2:07 PM  6CIT Screen  What time? 0 points  Count back from 20 0 points  Months in reverse 0 points  Repeat phrase 0 points    Immunizations Immunization History  Administered Date(s) Administered   Influenza Whole 10/30/2021   Influenza,inj,Quad PF,6+ Mos 11/01/2019   Influenza-Unspecified 10/30/2020   Moderna Covid-19 Vaccine Bivalent Booster 43yr & up 05/30/2022   Moderna Sars-Covid-2 Vaccination 05/13/2020, 06/23/2020, 07/12/2021   Pneumococcal Polysaccharide-23 12/18/2020   Td 12/30/2020   Zoster Recombinat (Shingrix) 12/30/2020, 05/29/2021    TDAP status: Up to date  Flu Vaccine status: Up to date  Pneumococcal vaccine status: Up to date  Covid-19 vaccine status: Completed vaccines  Qualifies for Shingles Vaccine? Yes   Zostavax completed Yes   Shingrix Completed?: Yes  Screening Tests Health Maintenance  Topic Date Due   OPHTHALMOLOGY EXAM  06/11/2022   INFLUENZA VACCINE  07/30/2022   HEMOGLOBIN  A1C  09/12/2022   FOOT EXAM  10/11/2022   PAP SMEAR-Modifier  09/30/2023   MAMMOGRAM  01/23/2024   COLONOSCOPY (Pts 45-437yrInsurance coverage will need to be confirmed)  08/30/2026   TETANUS/TDAP  12/30/2030   COVID-19 Vaccine  Completed   Hepatitis C Screening  Completed   HIV Screening  Completed   Zoster Vaccines- Shingrix  Completed   HPV VACCINES  Aged Out    Health Maintenance  There are no preventive care reminders to display for this patient.  Mammogram status: Completed 01-22-22. Repeat every year   Lung Cancer Screening: (Low Dose CT Chest recommended if Age 57-80ears, 30 pack-year currently smoking OR have quit w/in 15years.) does not qualify.    Additional Screening:  Hepatitis C Screening: does qualify; Completed 01-30-21  Vision Screening: Recommended annual ophthalmology exams for early detection of glaucoma and other disorders of the eye. Is the patient up to date with their annual eye exam?  Yes  Who is the provider or what is the name of the office in which the patient attends annual eye exams? [facility] is not established with a provider, would they like to be referred to a provider to establish care? No .   Dental Screening: Recommended annual dental exams for proper oral hygiene  Community Resource Referral / Chronic Care Management: CRR required this visit?  No   CCM required this visit?  No      Plan:     I have personally reviewed and noted the following in the patient's chart:   Medical and social history Use of alcohol, tobacco or illicit drugs  Current medications and supplements including opioid prescriptions.  Functional ability and status Nutritional status Physical activity Advanced directives List of other physicians Hospitalizations, surgeries, and ER visits in previous 12 months Vitals Screenings to include cognitive, depression, and falls Referrals and appointments  In addition, I have reviewed and discussed  with patient certain preventive protocols, quality metrics, and best practice recommendations. A written personalized care plan for preventive services as well as general preventive health recommendations were provided to patient.     ShShelda AltesCMA  06/04/2022   Nurse Notes:  Ms. Halls , Thank you for taking time to come for your Medicare Wellness Visit. I appreciate your ongoing commitment to your health goals. Please review the following plan we discussed and let me know if I can assist you in the future.   These are the goals we discussed:  Goals      Medication Management     Patient Goals/Self-Care Activities Patient will:  Take medications as prescribed Check blood sugar continuously with continuous glucose monitor, document, and provide at future appointments Engage in dietary modifications by less frequent dining out and fewer sweetened foods & beverages            This is a list of the screening recommended for you and due dates:  Health Maintenance  Topic Date Due   Eye exam for diabetics  06/11/2022   Flu Shot  07/30/2022   Hemoglobin A1C  09/12/2022   Complete foot exam   10/11/2022   Pap Smear  09/30/2023   Mammogram  01/23/2024   Colon Cancer Screening  08/30/2026   Tetanus Vaccine  12/30/2030   COVID-19 Vaccine  Completed   Hepatitis C Screening: USPSTF Recommendation to screen - Ages 18-79 yo.  Completed   HIV Screening  Completed   Zoster (Shingles) Vaccine  Completed   HPV Vaccine  Aged Out

## 2022-06-05 ENCOUNTER — Ambulatory Visit (INDEPENDENT_AMBULATORY_CARE_PROVIDER_SITE_OTHER): Payer: Medicare Other | Admitting: Nurse Practitioner

## 2022-06-07 DIAGNOSIS — R21 Rash and other nonspecific skin eruption: Secondary | ICD-10-CM | POA: Insufficient documentation

## 2022-06-07 NOTE — Assessment & Plan Note (Signed)
  Lab Results  Component Value Date   HGBA1C 8.0 (A) 03/12/2022   On glipizide and Trulicity On metformin XR and Toujeo 170 units nightly Followed by endocrinology Advised to follow diabetic diet On ARB and statin F/u CMP and lipid panel Diabetic eye exam: Advised to follow up with Ophthalmology for diabetic eye exam

## 2022-06-07 NOTE — Assessment & Plan Note (Signed)
Was treated for tinea versicolor, has persistent rash Advised to discuss with dermatology 

## 2022-06-07 NOTE — Assessment & Plan Note (Signed)
Her frontal headache is likely due to allergic sinusitis Started Clarinex Uses Flonase Has been seen by ENT specialist

## 2022-06-07 NOTE — Assessment & Plan Note (Signed)
BP Readings from Last 1 Encounters:  06/03/22 128/88   Well-controlled with losartan 25 mg QD and Metoprolol 25 mg BID Counseled for compliance with the medications Advised DASH diet and moderate exercise/walking, at least 150 mins/week

## 2022-06-11 ENCOUNTER — Other Ambulatory Visit: Payer: Self-pay | Admitting: Internal Medicine

## 2022-06-27 ENCOUNTER — Encounter: Payer: Self-pay | Admitting: Internal Medicine

## 2022-06-27 ENCOUNTER — Other Ambulatory Visit: Payer: Self-pay | Admitting: *Deleted

## 2022-06-27 DIAGNOSIS — E1165 Type 2 diabetes mellitus with hyperglycemia: Secondary | ICD-10-CM

## 2022-06-28 ENCOUNTER — Ambulatory Visit (INDEPENDENT_AMBULATORY_CARE_PROVIDER_SITE_OTHER): Payer: Medicare Other | Admitting: Internal Medicine

## 2022-06-28 ENCOUNTER — Encounter: Payer: Self-pay | Admitting: Internal Medicine

## 2022-06-28 VITALS — BP 128/84 | HR 100 | Resp 18 | Ht 69.0 in | Wt 269.8 lb

## 2022-06-28 DIAGNOSIS — T148XXA Other injury of unspecified body region, initial encounter: Secondary | ICD-10-CM

## 2022-06-28 DIAGNOSIS — R21 Rash and other nonspecific skin eruption: Secondary | ICD-10-CM

## 2022-06-28 NOTE — Assessment & Plan Note (Signed)
Was treated for tinea versicolor, has persistent rash Advised to discuss with dermatology

## 2022-06-28 NOTE — Patient Instructions (Signed)
Please use Neosporin over shoe bite area. It does not look infectious at this time to warrant antibiotic.  Please continue to wear soft and wide based shoes.

## 2022-06-28 NOTE — Progress Notes (Signed)
Acute Office Visit  Subjective:    Patient ID: Sydney Taylor, female    DOB: 1966/03/04, 56 y.o.   MRN: [id number]  Chief Complaint  Patient presents with   Acute Visit    Pt has spot on 3rd left toe bought new shoes and ever since wearing toe is puffy and hurts this started 06-14-22    HPI Patient is in today for c/o bump over left fourth toe X 2 weeks.  Of note, she had a new shoes around that time and had shoe bite over that area.  Her toe has been puffy, but denies any bleeding or pus discharge from the area.  Denies any ulcer on the foot.  She also asked about recurrent red rash over her LE.  She has seen dermatology for it, and was told of vasculitis.  I had discussion with her about possible etiologies of her rash up to my best capacity.  Past Medical History:  Diagnosis Date   Anemia    Anxiety    Arrhythmia    Bipolar depression ([place]) 12/28/2019   Chronic fatigue    Chronic headaches    Depression    Diabetes mellitus without complication (HCC)    Fibromyalgia    GERD (gastroesophageal reflux disease)    High cholesterol    HLD (hyperlipidemia)    Hypertension    Insect bite    Migraine    Prediabetes    Sleep apnea    Urinary incontinence     Past Surgical History:  Procedure Laterality Date   CHOLECYSTECTOMY N/A 03/15/2022   Procedure: LAPAROSCOPIC CHOLECYSTECTOMY WITH INTRAOPERATIVE CHOLANGIOGRAM;  Surgeon: Virl Cagey, MD;  Location: [facility];  Service: General;  Laterality: N/A;   COLONOSCOPY  2017   per patient normal and needs next one in 2027 (Dr. Posey Pronto)   ESOPHAGOGASTRODUODENOSCOPY N/A 05/12/2020   Procedure: ESOPHAGOGASTRODUODENOSCOPY (EGD);  Surgeon: Daneil Dolin, MD;  Location: [facility];  Service: Endoscopy;  Laterality: N/A;  11:15am   Joint fusion on foot Right    LIVER BIOPSY N/A 03/15/2022   Procedure: LIVER BIOPSY;  Surgeon: Virl Cagey, MD;  Location: [facility];  Service: General;  Laterality: N/A;   MALONEY DILATION N/A  05/12/2020   Procedure: Keturah Shavers;  Surgeon: Daneil Dolin, MD;  Location: [facility];  Service: Endoscopy;  Laterality: N/A;   NASAL SEPTUM SURGERY     SINOSCOPY      Family History  Problem Relation Age of Onset   Hypertension Father    Hyperlipidemia Father    Diabetes Father    Stroke Father    Depression Father    Anxiety disorder Father    Hypertension Mother    Hyperlipidemia Mother    Diabetes Mellitus II Mother    Congestive Heart Failure Mother    Heart attack Mother    Hypertension Sister    Diabetes Sister    Hypertension Brother    Autoimmune disease Brother    Diabetes Maternal Grandmother    Lupus Paternal Grandmother    Depression Paternal Grandmother    Anxiety disorder Paternal Grandmother    Depression Maternal Aunt    Anxiety disorder Maternal Aunt    Colon cancer Neg Hx    Esophageal cancer Neg Hx    Gastric cancer Neg Hx     Social History   Socioeconomic History   Marital status: Married    Spouse name: Not on file   Number of children: 0  Years of education: Not on file   Highest education level: Bachelor's degree (e.g., BA, AB, BS)  Occupational History   Occupation: research associate/drug development  Tobacco Use   Smoking status: Former    Types: Cigarettes    Quit date: 12/30/1982    Years since quitting: 39.5   Smokeless tobacco: Never  Vaping Use   Vaping Use: Never used  Substance and Sexual Activity   Alcohol use: Not Currently    Alcohol/week: 0.0 standard drinks of alcohol   Drug use: Never   Sexual activity: Yes    Birth control/protection: Post-menopausal  Other Topics Concern   Not on file  Social History Narrative   Married for 3 years.Lives with husband.Bachelors in Education officer, museum.   Right handed   Caffeine: 1-2 per day    Social Determinants of Health   Financial Resource Strain: Low Risk  (06/04/2022)   Overall Financial Resource Strain (CARDIA)    Difficulty of Paying Living Expenses: Not hard at all   Food Insecurity: No Food Insecurity (06/04/2022)   Hunger Vital Sign    Worried About Running Out of Food in the Last Year: Never true    Ran Out of Food in the Last Year: Never true  Transportation Needs: No Transportation Needs (06/04/2022)   PRAPARE - Hydrologist (Medical): No    Lack of Transportation (Non-Medical): No  Physical Activity: Inactive (06/04/2022)   Exercise Vital Sign    Days of Exercise per Week: 0 days    Minutes of Exercise per Session: 0 min  Stress: No Stress Concern Present (06/04/2022)   [place]    Feeling of Stress : Not at all  Social Connections: Moderately Integrated (06/04/2022)   Social Connection and Isolation Panel [NHANES]    Frequency of Communication with Friends and Family: More than three times a week    Frequency of Social Gatherings with Friends and Family: More than three times a week    Attends Religious Services: More than 4 times per year    Active Member of [employer] or Organizations: No    Attends Archivist Meetings: Never    Marital Status: Married  Human resources officer Violence: Not At Risk (06/04/2022)   Humiliation, Afraid, Rape, and Kick questionnaire    Fear of Current or Ex-Partner: No    Emotionally Abused: No    Physically Abused: No    Sexually Abused: No    Outpatient Medications Prior to Visit  Medication Sig Dispense Refill   ALPRAZolam (XANAX) 0.5 MG tablet Take 0.25 mg by mouth daily as needed for anxiety.     Aspirin-Caffeine (BC FAST PAIN RELIEF PO) Take 1 packet by mouth daily as needed (pain).     B-12 Methylcobalamin 1000 MCG TBDP Take 1,000 mcg by mouth daily.     blood glucose meter kit and supplies KIT Inject 1 each into the skin daily. Check blood glucose once a day. Diagnosis code: E 11.9. 1 each 0   buPROPion ER (WELLBUTRIN SR) 100 MG 12 hr tablet Take 100 mg by mouth daily.     calcium carbonate (TUMS - DOSED IN MG  ELEMENTAL CALCIUM) 500 MG chewable tablet Chew 1,000 mg by mouth daily as needed for indigestion or heartburn.     chlorthalidone (HYGROTON) 25 MG tablet Take 0.5 tablets (12.5 mg total) by mouth daily. 45 tablet 1   Continuous Blood Gluc Sensor (DEXCOM G6 SENSOR) MISC by Does not  apply route.     Continuous Blood Gluc Transmit (DEXCOM G6 TRANSMITTER) MISC by Does not apply route.     desloratadine (CLARINEX) 5 MG tablet Take 1 tablet (5 mg total) by mouth daily. 30 tablet 5   DULoxetine (CYMBALTA) 60 MG capsule Take 60 mg by mouth every morning.     glucose blood (ACCU-CHEK GUIDE) test strip 1 each by Other route in the morning, at noon, and at bedtime. Use as instructed 300 each 3   ibuprofen (ADVIL) 200 MG tablet Take 600 mg by mouth every 6 (six) hours as needed for moderate pain or headache.     insulin glargine, 2 Unit Dial, (TOUJEO MAX SOLOSTAR) 300 UNIT/ML Solostar Pen Inject 170 Units into the skin every morning. And pen needles 1/day 51 mL 1   Insulin Pen Needle (PEN NEEDLES) 32G X 4 MM MISC Check BS 2 x a day 100 each 5   JARDIANCE 10 MG TABS tablet TAKE 1 TABLET BY MOUTH ONCE DAILY BEFORE BREAKFAST 30 tablet 0   KLOR-CON M20 20 MEQ tablet TAKE 2 TABLETS BY MOUTH TWICE A DAY (Patient taking differently: Take 40 mEq by mouth daily.) 360 tablet 1   L-Methylfolate 15 MG TABS Take 15 mg by mouth daily at 6 (six) AM.     losartan (COZAAR) 25 MG tablet TAKE 1 TABLET(25 MG) BY MOUTH DAILY 90 tablet 0   metFORMIN (GLUCOPHAGE-XR) 500 MG 24 hr tablet Take 2 tablets (1,000 mg total) by mouth daily with breakfast. (Patient taking differently: Take 1,000 mg by mouth daily with supper.) 180 tablet 3   Multiple Vitamins-Minerals (MULTIVITAMIN WOMEN 50+) TABS Take 1 tablet by mouth daily.     ondansetron (ZOFRAN) 4 MG tablet Take 1 tablet (4 mg total) by mouth every 8 (eight) hours as needed. 30 tablet 1   pantoprazole (PROTONIX) 40 MG tablet Take 1 tablet (40 mg total) by mouth daily. 90 tablet 0    Probiotic Product (PROBIOTIC PO) Take 1 capsule by mouth daily.     rosuvastatin (CRESTOR) 10 MG tablet Take 1 tablet (10 mg total) by mouth daily. (Patient taking differently: Take 10 mg by mouth every other day.) 30 tablet 11   SUMAtriptan (IMITREX) 50 MG tablet MAY REPEAT IN 2 HOURS IF HEADACHE PERSISTS OR RECURS. MAXIMUM 2 PER DAY. 10 tablet 0   traZODone (DESYREL) 50 MG tablet Take 12.5 mg by mouth at bedtime.     metoprolol tartrate (LOPRESSOR) 25 MG tablet Take 1 tablet (25 mg total) by mouth 2 (two) times daily. 180 tablet 3   Cholecalciferol (VITAMIN D3) 125 MCG (5000 UT) TABS Take 2 tablets (10,000 Units total) by mouth daily. 180 tablet 1   metroNIDAZOLE (METROGEL) 0.75 % gel Apply 1 application. topically 2 (two) times daily. 45 g 0   No facility-administered medications prior to visit.    Allergies  Allergen Reactions   Cephalexin Itching, Swelling and Other (See Comments)    Review of Systems  Constitutional:  Negative for chills and fever.  Skin:        Swelling of left foot, 4th toe       Objective:    Physical Exam Constitutional:      General: She is not in acute distress.    Appearance: She is obese. She is not diaphoretic.  HENT:     Head: Normocephalic and atraumatic.     Nose: Congestion present.     Mouth/Throat:     Mouth: Mucous membranes are dry.  Pharynx: No posterior oropharyngeal erythema.  Eyes:     General: No scleral icterus.    Extraocular Movements: Extraocular movements intact.  Cardiovascular:     Rate and Rhythm: Normal rate and regular rhythm.     Pulses: Normal pulses.     Heart sounds: Normal heart sounds. No murmur heard. Pulmonary:     Breath sounds: Normal breath sounds. No wheezing or rales.  Musculoskeletal:     Cervical back: Neck supple. No tenderness.     Right lower leg: Edema (Trace) present.     Left lower leg: Edema (Trace) present.  Skin:    General: Skin is dry.     Coloration: Skin is not pale.     Findings:  Rash (Multiple depigmented areas - circular/oval over b/l thigh and axilla, right leg) present.     Comments: Mild swelling and bruise over left 4th toe  Neurological:     General: No focal deficit present.     Mental Status: She is alert and oriented to person, place, and time.  Psychiatric:        Mood and Affect: Mood is depressed. Affect is flat.        Behavior: Behavior normal.        Thought Content: Thought content normal.     BP 128/84 (BP Location: Right Arm, Patient Position: Sitting, Cuff Size: Normal)   Pulse 100   Resp 18   Ht _0  (1.753 m)   Wt 269 lb 12.8 oz (122.4 kg)   LMP  (LMP Unknown)   SpO2 97%   BMI 39.84 kg/m  Wt Readings from Last 3 Encounters:  06/28/22 269 lb 12.8 oz (122.4 kg)  06/03/22 265 lb 6.4 oz (120.4 kg)  04/25/22 269 lb (122 kg)        Assessment & Plan:   Problem List Items Addressed This Visit       Musculoskeletal and Integument   Rash and nonspecific skin eruption    Was treated for tinea versicolor, has persistent rash Advised to discuss with dermatology      Other Visit Diagnoses     Bruising    -  Primary Due to shoe bite injury Advised to apply Neosporin cream as needed for local irritation Avoid hard based or narrow top shoes        No orders of the defined types were placed in this encounter.    Lindell Spar, MD

## 2022-07-10 ENCOUNTER — Other Ambulatory Visit: Payer: Self-pay | Admitting: Internal Medicine

## 2022-07-10 LAB — HM DIABETES EYE EXAM

## 2022-07-18 ENCOUNTER — Ambulatory Visit (INDEPENDENT_AMBULATORY_CARE_PROVIDER_SITE_OTHER): Payer: Medicare Other | Admitting: *Deleted

## 2022-07-18 DIAGNOSIS — E1165 Type 2 diabetes mellitus with hyperglycemia: Secondary | ICD-10-CM

## 2022-07-18 DIAGNOSIS — E782 Mixed hyperlipidemia: Secondary | ICD-10-CM

## 2022-07-18 NOTE — Patient Instructions (Signed)
Visit Information  Thank you for taking time to visit with me today. Please don't hesitate to contact me if I can be of assistance to you before our next scheduled telephone appointment.  Following are the goals we discussed today:  Patient will attend all scheduled provider appointments as evidenced by clinician review of documented attendance to scheduled appointments and patient/caregiver report Patient will call provider office for new concerns or questions as evidenced by review of documented incoming telephone call notes and patient report Reinforced checking blood sugar as prescribed and record, take to doctor's visits Avoid/ limit saturated/ trans fats- broil or bake foods instead of frying- read labels Practice portion control Get outside daily, try walking for exercise if you are able Keep stress under control, practice relaxation, try to get outdoors daily Follow carbohydrate modified diet/ plate method Continue working with your psychiatrist and counselor Continue to follow up with endocrinologist for any elevated blood sugars Congratulations on decrease in your [facility] Case closure today, goals met, please talk to your primary care provider if you have any future needs for care management  Diarrhea, Adult Diarrhea is frequent loose and watery bowel movements. Diarrhea can make you feel weak and cause you to become dehydrated. Dehydration can make you tired and thirsty, cause you to have a dry mouth, and decrease how often you urinate. Diarrhea typically lasts 2-3 days. However, it can last longer if it is a sign of something more serious. It is important to treat your diarrhea as told by your health care provider. Follow these instructions at home: Eating and drinking     Follow these recommendations as told by your health care provider: Take an oral rehydration solution (ORS). This is an over-the-counter medicine that helps return your body to its normal balance of nutrients and  water. It is found at pharmacies and retail stores. Drink plenty of fluids, such as water, ice chips, diluted fruit juice, and low-calorie sports drinks. You can drink milk also, if desired. Avoid drinking fluids that contain a lot of sugar or caffeine, such as energy drinks, sports drinks, and soda. Eat bland, easy-to-digest foods in small amounts as you are able. These foods include bananas, applesauce, rice, lean meats, toast, and crackers. Avoid alcohol. Avoid spicy or fatty foods.  Medicines Take over-the-counter and prescription medicines only as told by your health care provider. If you were prescribed an antibiotic medicine, take it as told by your health care provider. Do not stop using the antibiotic even if you start to feel better. General instructions  Wash your hands often using soap and water. If soap and water are not available, use a hand sanitizer. Others in the household should wash their hands as well. Hands should be washed: After using the toilet or changing a diaper. Before preparing, cooking, or serving food. While caring for a sick person or while visiting someone in a hospital. Drink enough fluid to keep your urine pale yellow. Rest at home while you recover. Watch your condition for any changes. Take a warm bath to relieve any burning or pain from frequent diarrhea episodes. Keep all follow-up visits as told by your health care provider. This is important. Contact a health care provider if: You have a fever. Your diarrhea gets worse. You have new symptoms. You cannot keep fluids down. You feel light-headed or dizzy. You have a headache. You have muscle cramps. Get help right away if: You have chest pain. You feel extremely weak or you faint. You have  bloody or black stools or stools that look like tar. You have severe pain, cramping, or bloating in your abdomen. You have trouble breathing or you are breathing very quickly. Your heart is beating very  quickly. Your skin feels cold and clammy. You feel confused. You have signs of dehydration, such as: Dark urine, very little urine, or no urine. Cracked lips. Dry mouth. Sunken eyes. Sleepiness. Weakness. Summary Diarrhea is frequent loose and sometimes watery bowel movements. Diarrhea can make you feel weak and cause you to become dehydrated. Drink enough fluids to keep your urine pale yellow. Make sure that you wash your hands after using the toilet. If soap and water are not available, use hand sanitizer. Contact a health care provider if your diarrhea gets worse or you have new symptoms. Get help right away if you have signs of dehydration. This information is not intended to replace advice given to you by your health care provider. Make sure you discuss any questions you have with your health care provider. Document Revised: 06/27/2021 Document Reviewed: 06/27/2021 Elsevier Patient Education  [place].   Please call the care guide team at [phone] if you need to cancel or reschedule your appointment.   If you are experiencing a Mental Health or [place] or need someone to talk to, please call the Suicide and Crisis Lifeline: 988 call the [place] National Suicide Prevention Lifeline: 434 223 7920 or TTY: 5026543920 TTY 918 874 3464) to talk to a trained counselor call 1-800-273-TALK (toll free, 24 hour hotline) go to [facility] [address], [place] [phone]) call 911   Patient verbalizes understanding of instructions and care plan provided today and agrees to view in [place]. Active MyChart status and patient understanding of how to access instructions and care plan via MyChart confirmed with patient.     Jacqlyn Larsen [facility], BSN RN Case Manager [facility] [phone]

## 2022-07-18 NOTE — Chronic Care Management (AMB) (Signed)
Chronic Care Management   CCM RN Visit Note  07/18/2022 Name: Sydney Taylor MRN: [id number] DOB: Dec 08, 1966  Subjective: Sydney Taylor is a 56 y.o. year old female who is a primary care patient of Sydney Spar, MD. The care management team was consulted for assistance with disease management and care coordination needs.    Engaged with patient by telephone for follow up visit in response to provider referral for case management and/or care coordination services.   Consent to Services:  The patient was given information about Chronic Care Management services, agreed to services, and gave verbal consent prior to initiation of services.  Please see initial visit note for detailed documentation.   Patient agreed to services and verbal consent obtained.   Assessment: Review of patient past medical history, allergies, medications, health status, including review of consultants reports, laboratory and other test data, was performed as part of comprehensive evaluation and provision of chronic care management services.   SDOH (Social Determinants of Health) assessments and interventions performed:    CCM Care Plan  Allergies  Allergen Reactions   Cephalexin Itching, Swelling and Other (See Comments)    Outpatient Encounter Medications as of 07/18/2022  Medication Sig Note   ALPRAZolam (XANAX) 0.5 MG tablet Take 0.25 mg by mouth daily as needed for anxiety.    Aspirin-Caffeine (BC FAST PAIN RELIEF PO) Take 1 packet by mouth daily as needed (pain).    B-12 Methylcobalamin 1000 MCG TBDP Take 1,000 mcg by mouth daily.    blood glucose meter kit and supplies KIT Inject 1 each into the skin daily. Check blood glucose once a day. Diagnosis code: E 11.9.    calcium carbonate (TUMS - DOSED IN MG ELEMENTAL CALCIUM) 500 MG chewable tablet Chew 1,000 mg by mouth daily as needed for indigestion or heartburn.    chlorthalidone (HYGROTON) 25 MG tablet Take 0.5 tablets (12.5 mg total) by mouth  daily.    Continuous Blood Gluc Sensor (DEXCOM G6 SENSOR) MISC by Does not apply route.    Continuous Blood Gluc Transmit (DEXCOM G6 TRANSMITTER) MISC by Does not apply route.    desloratadine (CLARINEX) 5 MG tablet Take 1 tablet (5 mg total) by mouth daily.    glucose blood (ACCU-CHEK GUIDE) test strip 1 each by Other route in the morning, at noon, and at bedtime. Use as instructed    ibuprofen (ADVIL) 200 MG tablet Take 600 mg by mouth every 6 (six) hours as needed for moderate pain or headache.    insulin glargine, 2 Unit Dial, (TOUJEO MAX SOLOSTAR) 300 UNIT/ML Solostar Pen Inject 170 Units into the skin every morning. And pen needles 1/day    Insulin Pen Needle (PEN NEEDLES) 32G X 4 MM MISC Check BS 2 x a day    JARDIANCE 10 MG TABS tablet TAKE 1 TABLET BY MOUTH ONCE DAILY BEFORE BREAKFAST    KLOR-CON M20 20 MEQ tablet TAKE 2 TABLETS BY MOUTH TWICE A DAY (Patient taking differently: Take 40 mEq by mouth daily.)    L-Methylfolate 15 MG TABS Take 15 mg by mouth daily at 6 (six) AM.    losartan (COZAAR) 25 MG tablet TAKE 1 TABLET(25 MG) BY MOUTH DAILY    metFORMIN (GLUCOPHAGE-XR) 500 MG 24 hr tablet Take 2 tablets (1,000 mg total) by mouth daily with breakfast. (Patient taking differently: Take 1,000 mg by mouth daily with supper.)    Multiple Vitamins-Minerals (MULTIVITAMIN WOMEN 50+) TABS Take 1 tablet by mouth daily.    ondansetron (  ZOFRAN) 4 MG tablet Take 1 tablet (4 mg total) by mouth every 8 (eight) hours as needed.    pantoprazole (PROTONIX) 40 MG tablet Take 1 tablet (40 mg total) by mouth daily.    Probiotic Product (PROBIOTIC PO) Take 1 capsule by mouth daily.    rosuvastatin (CRESTOR) 10 MG tablet Take 1 tablet (10 mg total) by mouth daily. (Patient taking differently: Take 10 mg by mouth every other day.)    SUMAtriptan (IMITREX) 50 MG tablet MAY REPEAT IN 2 HOURS IF HEADACHE PERSISTS OR RECURS. MAXIMUM 2 PER DAY.    traZODone (DESYREL) 50 MG tablet Take 12.5 mg by mouth at bedtime.     buPROPion ER (WELLBUTRIN SR) 100 MG 12 hr tablet Take 100 mg by mouth daily.    DULoxetine (CYMBALTA) 60 MG capsule Take 60 mg by mouth every morning. 05/01/2022: Takes twice a day   metoprolol tartrate (LOPRESSOR) 25 MG tablet Take 1 tablet (25 mg total) by mouth 2 (two) times daily.    No facility-administered encounter medications on file as of 07/18/2022.    Patient Active Problem List   Diagnosis Date Noted   Rash and nonspecific skin eruption 06/07/2022   Essential hypertension 06/03/2022   Fatty liver 03/05/2022   Gallstone pancreatitis 03/04/2022   Acute pancreatitis 02/12/2022   Encounter for general adult medical examination with abnormal findings 02/01/2022   Plantar fasciitis 02/01/2022   Chest pain 01/14/2022   Chronic migraine without aura without status migrainosus, not intractable 10/29/2021   Diabetic polyneuropathy associated with type 2 diabetes mellitus ([place]) 09/19/2021   History of hormone therapy 09/19/2021   Bilateral primary osteoarthritis of knee 04/10/2021   Perennial allergic rhinitis 04/06/2021   Allergic contact dermatitis 04/06/2021   Cholinergic urticaria 04/06/2021   Fibromyalgia syndrome 01/12/2021   Elevated C-reactive protein (CRP) 01/12/2021   Diverticulosis of colon without hemorrhage 01/02/2021   Major depressive disorder, single episode, severe ([place]) 07/14/2020   GERD (gastroesophageal reflux disease) 05/11/2020   Type 2 diabetes mellitus ([place]) 03/27/2020   Bipolar depression ([place]) 12/28/2019   Sleep apnea 12/28/2019   Tachycardia 10/31/2019   Hyperlipidemia 10/30/2015   Morbid obesity ([place]) 10/30/2015    Conditions to be addressed/monitored:HLD and DMII  Care Plan : RN Care Manager plan of care  Updates made by Sydney Mends, RN since 07/18/2022 12:00 AM  Completed 07/18/2022   Problem: No plan of care established for management of chronic disease states- DM2, HLD Resolved 07/18/2022  Priority: High     Long-Range Goal: Development  of plan of care for chronic disease management - DM2, HLD Completed 07/18/2022  Start Date: 11/08/2021  Expected End Date: 09/17/2022  Priority: High  Note:    Current Barriers:  Knowledge Deficits related to plan of care for management of HLD and DMII  Care Coordination needs related to Mental Health Concerns  and Lacks knowledge of community resource: Patient reports she has depression and is followed by psychiatrist and counselor regularly, patient reports her spouse attends some of these sessions and this is helping, declines services of CCM LCSW (PHQ9=10).  Chronic Disease Management support and education needs related to HLD and DMII- Patient lives with spouse, is mostly independent with ADL, IADL's, continues to drive, CBG now checked with Dexcom with fasting readings usually low 100's range with recent reading 115,  random readings in 100's range,  incision has healed from gallbladder surgery. Patient is trying to adhere to ADA/ carbohydrate modified diet. Patient is excited that [facility] is  down to 8.0 from 12.4.  Patient reports she does not exercise.  Patient has chronic pain and saw pain specialist in the past but did not feel was helpful, pain varies day to day, takes medication which is helpful.  Pt reports she had "gallbladder removed on 03/15/22" and reports has recovered well, recently has had "some diarrhea" and not sure if related to having gallbladder removed or some other issue and has appointment to see primary care provider on 07/30/22, no other concerns reported today.  RNCM Clinical Goal(s):  Patient will verbalize understanding of plan for management of HLD and DMII as evidenced by   verbalize basic understanding of HLD and DMII disease process and self health management plan as evidenced by   attend all scheduled medical appointments:  06/03/22 primary care provider as evidenced by patient verbalizing attended appointments/ chart review        demonstrate ongoing self health care  management ability - calling provider for concerns, questions as evidenced by     patient report, review of EHR and through collaboration with RN Care manager, provider, and care team.   Interventions: 1:1 collaboration with primary care provider regarding development and update of comprehensive plan of care as evidenced by provider attestation and co-signature Inter-disciplinary care team collaboration (see longitudinal plan of care) Evaluation of current treatment plan related to  self management and patient's adherence to plan as established by provider  Hyperlipidemia Interventions: Medication review performed; medication list updated in electronic medical record.  Counseled on importance of regular laboratory monitoring as prescribed Reviewed role and benefits of statin for ASCVD risk reduction Reviewed importance of limiting foods high in cholesterol Reviewed importance of getting outside daily in the sunshine if able Reinforced importance of continuing to see therapist Reinforced heart healthy diet   Pain assessment completed Reviewed relaxation and keeping stress to a minimum, getting outside daily if possible Reviewed plan of care with patient including case closure  Diabetes Interventions: Assessed patient's understanding of A1c goal: <7% Reviewed medications with patient and discussed importance of medication adherence Reviewed scheduled/upcoming provider appointments including:   06/03/22 primary care provider Review of patient status, including review of consultants reports, relevant laboratory and other test results, and medications completed Reviewed foods high in carbohydrates to limit, be mindful of intake Reinforced importance of keeping blood sugar within normal limits for optimal wound healing  Lab Results  Component Value Date   HGBA1C 11.3 (A) 10/11/2021     Patient Goals/Self-Care Activities: Patient will attend all scheduled provider appointments as evidenced by  clinician review of documented attendance to scheduled appointments and patient/caregiver report Patient will call provider office for new concerns or questions as evidenced by review of documented incoming telephone call notes and patient report Reinforced checking blood sugar as prescribed and record, take to doctor's visits Avoid/ limit saturated/ trans fats- broil or bake foods instead of frying- read labels Practice portion control Get outside daily, try walking for exercise if you are able Keep stress under control, practice relaxation, try to get outdoors daily Follow carbohydrate modified diet/ plate method Continue working with your psychiatrist and counselor Continue to follow up with endocrinologist for any elevated blood sugars Congratulations on decrease in your [facility] Case closure today, goals met, please talk to your primary care provider if you have any future needs for care management        Plan:No further follow up required: case closure today  Jacqlyn Larsen [facility], BSN RN Case Manager [facility] Primary Care  [phone]          

## 2022-07-20 ENCOUNTER — Other Ambulatory Visit: Payer: Self-pay | Admitting: Internal Medicine

## 2022-07-21 ENCOUNTER — Encounter: Payer: Self-pay | Admitting: Internal Medicine

## 2022-07-22 ENCOUNTER — Other Ambulatory Visit: Payer: Self-pay | Admitting: *Deleted

## 2022-07-22 ENCOUNTER — Telehealth: Payer: Self-pay | Admitting: Internal Medicine

## 2022-07-22 MED ORDER — EMPAGLIFLOZIN 10 MG PO TABS: 10.0000 mg | ORAL_TABLET | Freq: Every day | ORAL | 0 refills | Status: DC

## 2022-07-22 NOTE — Telephone Encounter (Signed)
Patient left a voicemail stating her dermatologist just diagnosed a rash on her legs and feet as vasculitis and said it is generally an autoimmune issue and that she needed to contact Dr. Benjamine Mola so he could do a work up.  Patient states she has seen Dr. Benjamine Mola a couple of times in the past.

## 2022-07-23 NOTE — Telephone Encounter (Signed)
Spoke with patient and advised we have not seen her since April 2022. She had elevated inflammatory markers but joint pains looked more consistent with osteoarthritis and fibromyalgia. Dr.Rice did not see this symptom at the time. We would need to make a new appointment before starting any kind of different treatment, ideally if we could get/she could bring her dermatologist's note or any studies collected. Patient has been scheduled for 09/03/2022 and will collect information from dermatologist to bring with her to her appointment.

## 2022-07-23 NOTE — Telephone Encounter (Signed)
We have not seen her since April 2022. She had elevated inflammatory markers but joint pains looked more consistent with osteoarthritis and fibromyalgia. I did not see this symptom at the time. We would need to make a new appointment before starting any kind of different treatment, ideally if we could get/she could bring her dermatologist's note or any studies collected.

## 2022-07-29 DIAGNOSIS — E1165 Type 2 diabetes mellitus with hyperglycemia: Secondary | ICD-10-CM

## 2022-07-29 DIAGNOSIS — E782 Mixed hyperlipidemia: Secondary | ICD-10-CM

## 2022-07-30 ENCOUNTER — Ambulatory Visit (INDEPENDENT_AMBULATORY_CARE_PROVIDER_SITE_OTHER): Payer: Medicare Other | Admitting: Gastroenterology

## 2022-07-30 ENCOUNTER — Encounter: Payer: Self-pay | Admitting: Endocrinology

## 2022-07-30 ENCOUNTER — Encounter: Payer: Self-pay | Admitting: Gastroenterology

## 2022-07-30 VITALS — BP 144/82 | HR 106 | Temp 98.0°F | Ht 69.0 in | Wt 273.8 lb

## 2022-07-30 DIAGNOSIS — A09 Infectious gastroenteritis and colitis, unspecified: Secondary | ICD-10-CM | POA: Diagnosis not present

## 2022-07-30 DIAGNOSIS — K760 Fatty (change of) liver, not elsewhere classified: Secondary | ICD-10-CM

## 2022-07-30 DIAGNOSIS — R197 Diarrhea, unspecified: Secondary | ICD-10-CM | POA: Insufficient documentation

## 2022-07-30 DIAGNOSIS — R1013 Epigastric pain: Secondary | ICD-10-CM | POA: Diagnosis not present

## 2022-07-30 DIAGNOSIS — K219 Gastro-esophageal reflux disease without esophagitis: Secondary | ICD-10-CM

## 2022-07-30 MED ORDER — COLESTIPOL HCL 1 G PO TABS: 2.0000 g | ORAL_TABLET | Freq: Every day | ORAL | 5 refills | Status: DC

## 2022-07-30 NOTE — Patient Instructions (Signed)
Please collect stool and have labs at your earliest convenience. Once your stool has been collected you can try colestid 2 capsules daily for diarrhea (do not take within 2 hours of your other medications).

## 2022-07-30 NOTE — Progress Notes (Signed)
GI Office Note    Referring Provider: Lindell Spar, MD Primary Care Physician:  Lindell Spar, MD  Primary Gastroenterologist: Garfield Cornea, MD   Chief Complaint   Chief Complaint  Patient presents with   Diarrhea    Off and on for 3 weeks, has been taking loperamide occasionally for relief.     History of Present Illness   Sydney Taylor is a 56 y.o. female presenting today for further evaluation of diarrhea.  She was last seen in March 2023.  Since that visit she completed laparoscopic cholecystectomy, suspected biliary pancreatitis.  Colonoscopy 2017 by Dr. Posey Pronto, patient reports that she is due in 2027.  History of abnormal CT in 2022 with hepatomegaly, hepatic steatosis.    EGD May 2021: Erosive reflux esophagitis with soft peptic stricture, status post dilation for dysphagia.  Liver biopsy at time of cholecystectomy in March showed moderate steatosis, no pathologic fibrosis.  Today: Weight is up 10 pounds since 02/2022. Complains of 3-4 weeks of diarrhea. At first 2 times per week but now every day. Imodium helps slow down, taking one  per day but makes her feel "icky". Worse in the morning and clears up by end of day. Also having some substernal chest pain similar to when had with pancreatitis. This has been occurring for couple of weeks. Notes nausea with diarrhea. Denies constipation. No melena, brbpr. No vomiting. No medication changes. No typical heartburn if takes pantoprazole daily. Rare nocturnal symptosm.     Medications   Current Outpatient Medications  Medication Sig Dispense Refill   ALPRAZolam (XANAX) 0.5 MG tablet Take 0.25 mg by mouth daily as needed for anxiety.     Aspirin-Caffeine (BC FAST PAIN RELIEF PO) Take 1 packet by mouth daily as needed (pain).     B-12 Methylcobalamin 1000 MCG TBDP Take 1,000 mcg by mouth daily.     blood glucose meter kit and supplies KIT Inject 1 each into the skin daily. Check blood glucose once a day. Diagnosis  code: E 11.9. 1 each 0   calcium carbonate (TUMS - DOSED IN MG ELEMENTAL CALCIUM) 500 MG chewable tablet Chew 1,000 mg by mouth daily as needed for indigestion or heartburn.     chlorthalidone (HYGROTON) 25 MG tablet Take 0.5 tablets (12.5 mg total) by mouth daily. 45 tablet 1   Continuous Blood Gluc Sensor (DEXCOM G6 SENSOR) MISC by Does not apply route.     Continuous Blood Gluc Transmit (DEXCOM G6 TRANSMITTER) MISC by Does not apply route.     DULoxetine (CYMBALTA) 60 MG capsule Take 60 mg by mouth every morning.     empagliflozin (JARDIANCE) 10 MG TABS tablet Take 1 tablet (10 mg total) by mouth daily before breakfast. 30 tablet 0   glucose blood (ACCU-CHEK GUIDE) test strip 1 each by Other route in the morning, at noon, and at bedtime. Use as instructed 300 each 3   ibuprofen (ADVIL) 200 MG tablet Take 600 mg by mouth every 6 (six) hours as needed for moderate pain or headache.     insulin glargine, 2 Unit Dial, (TOUJEO MAX SOLOSTAR) 300 UNIT/ML Solostar Pen Inject 170 Units into the skin every morning. And pen needles 1/day 51 mL 1   Insulin Pen Needle (PEN NEEDLES) 32G X 4 MM MISC Check BS 2 x a day 100 each 5   KLOR-CON M20 20 MEQ tablet TAKE 2 TABLETS BY MOUTH TWICE A DAY (Patient taking differently: Take 40 mEq by mouth daily.)  360 tablet 1   L-Methylfolate 15 MG TABS Take 15 mg by mouth daily at 6 (six) AM.     losartan (COZAAR) 25 MG tablet TAKE 1 TABLET(25 MG) BY MOUTH DAILY 90 tablet 0   metFORMIN (GLUCOPHAGE-XR) 500 MG 24 hr tablet Take 2 tablets (1,000 mg total) by mouth daily with breakfast. (Patient taking differently: Take 1,000 mg by mouth daily with supper.) 180 tablet 3   metoprolol tartrate (LOPRESSOR) 25 MG tablet Take 1 tablet (25 mg total) by mouth 2 (two) times daily. 180 tablet 3   Multiple Vitamins-Minerals (MULTIVITAMIN WOMEN 50+) TABS Take 1 tablet by mouth daily.     pantoprazole (PROTONIX) 40 MG tablet Take 1 tablet (40 mg total) by mouth daily. 90 tablet 0    rosuvastatin (CRESTOR) 10 MG tablet Take 1 tablet (10 mg total) by mouth daily. (Patient taking differently: Take 10 mg by mouth every other day.) 30 tablet 11   traZODone (DESYREL) 50 MG tablet Take 12.5 mg by mouth at bedtime.     No current facility-administered medications for this visit.    Allergies   Allergies as of 07/30/2022 - Review Complete 07/30/2022  Allergen Reaction Noted   Cephalexin Itching, Swelling, and Other (See Comments) 05/02/2020      Review of Systems   General: Negative for anorexia, weight loss, fever, chills, fatigue, weakness. ENT: Negative for hoarseness, difficulty swallowing , nasal congestion. CV: Negative for chest pain, angina, palpitations, dyspnea on exertion, peripheral edema.  Respiratory: Negative for dyspnea at rest, dyspnea on exertion, cough, sputum, wheezing.  GI: See history of present illness. GU:  Negative for dysuria, hematuria, urinary incontinence, urinary frequency, nocturnal urination.  Endo: Negative for unusual weight change.     Physical Exam   BP (!) 144/82 (BP Location: Left Arm, Patient Position: Sitting, Cuff Size: Large)   Pulse (!) 106   Temp 98 F (36.7 C) (Temporal)   Ht 5' 9"  (1.753 m)   Wt 273 lb 12.8 oz (124.2 kg)   LMP  (LMP Unknown)   SpO2 91%   BMI 40.43 kg/m    General: Well-nourished, well-developed in no acute distress. Tearful due to unexpected death in the family. Eyes: No icterus. Mouth: Oropharyngeal mucosa moist and pink , no lesions erythema or exudate. Abdomen: Bowel sounds are normal, obese. Diffuse upper abdominal tenderness (mild), no hepatosplenomegaly or masses appreciated. no abdominal bruits or hernia , no rebound or guarding.  Rectal: not performed  Extremities: 1+bilateral lower extremity edema. No clubbing or deformities. Neuro: Alert and oriented x 4   Skin: Warm and dry, no jaundice.   Psych: Alert and cooperative, normal mood and affect.  Labs   Lab Results  Component Value  Date   CREATININE 1.03 03/29/2022   BUN 18 03/29/2022   NA 139 03/29/2022   K 3.9 03/29/2022   CL 103 03/29/2022   CO2 27 03/29/2022   Lab Results  Component Value Date   ALT 18 03/29/2022   AST 14 03/29/2022   ALKPHOS 66 03/15/2022   BILITOT 0.4 03/29/2022   Lab Results  Component Value Date   WBC 11.1 (H) 03/29/2022   HGB 13.5 03/29/2022   HCT 41.5 03/29/2022   MCV 89.2 03/29/2022   PLT 314 03/29/2022   Lab Results  Component Value Date   LIPASE 32 05/01/2022    Imaging Studies   No results found.  Assessment   Diarrhea: noted for couple of weeks, predominantly in the mornings. Denies change in food  intake or medication change.   Hepatomegaly/hepatic steatosis: Liver biopsy done at time of cholecystectomy earlier this year showing moderate steatosis but no pathologic fibrosis. Recommend tight glycemic control, exercise regularly, weight control (strive for healthier weight), limit etoh (she does not drink).  Splenic lesion: 2.1cm splenic hypodensities seen on CT 01/2021. Smaller similar lesion seen on CT in 2018, "may represent a small cyst or lymphagioma".   GERD: controlled with pantoprazole  Upper abdominal pain: mild in nature. Doubt pancreatitis. Evaluate initial with labs. Encouraged use of daily PPI for now.   PLAN  Review CT with Dr. Thornton Papas to determine if surveillance study needed for splenic lesion. Labs to evaluate abdominal pain. Stool for GI profile. Once stool collected, she can start colestid for possibly bile acid diarrhea.  Stop imodium once Colestid started.   Laureen Ochs. Bobby Rumpf, [place], [facility]

## 2022-07-31 ENCOUNTER — Other Ambulatory Visit: Payer: Self-pay | Admitting: Endocrinology

## 2022-07-31 ENCOUNTER — Other Ambulatory Visit: Payer: Medicare Other

## 2022-07-31 DIAGNOSIS — E1165 Type 2 diabetes mellitus with hyperglycemia: Secondary | ICD-10-CM

## 2022-07-31 LAB — COMPREHENSIVE METABOLIC PANEL
ALT: 20 IU/L (ref 0–32)
AST: 16 IU/L (ref 0–40)
Albumin/Globulin Ratio: 2 (ref 1.2–2.2)
Albumin: 4.3 g/dL (ref 3.8–4.9)
Alkaline Phosphatase: 97 IU/L (ref 44–121)
BUN/Creatinine Ratio: 14 (ref 9–23)
BUN: 14 mg/dL (ref 6–24)
Bilirubin Total: 0.3 mg/dL (ref 0.0–1.2)
CO2: 25 mmol/L (ref 20–29)
Calcium: 9.4 mg/dL (ref 8.7–10.2)
Chloride: 100 mmol/L (ref 96–106)
Creatinine, Ser: 1.01 mg/dL — ABNORMAL HIGH (ref 0.57–1.00)
Globulin, Total: 2.1 g/dL (ref 1.5–4.5)
Glucose: 116 mg/dL — ABNORMAL HIGH (ref 70–99)
Potassium: 4.2 mmol/L (ref 3.5–5.2)
Sodium: 141 mmol/L (ref 134–144)
Total Protein: 6.4 g/dL (ref 6.0–8.5)
eGFR: 65 mL/min/{1.73_m2} (ref >59)

## 2022-07-31 LAB — CBC WITH DIFFERENTIAL/PLATELET
Basophils Absolute: 0.1 10*3/uL (ref 0.0–0.2)
Basos: 1 %
EOS (ABSOLUTE): 0.4 10*3/uL (ref 0.0–0.4)
Eos: 4 %
Hematocrit: 39.9 % (ref 34.0–46.6)
Hemoglobin: 12.6 g/dL (ref 11.1–15.9)
Immature Grans (Abs): 0.1 10*3/uL (ref 0.0–0.1)
Immature Granulocytes: 1 %
Lymphocytes Absolute: 2 10*3/uL (ref 0.7–3.1)
Lymphs: 21 %
MCH: 26.1 pg — ABNORMAL LOW (ref 26.6–33.0)
MCHC: 31.6 g/dL (ref 31.5–35.7)
MCV: 83 fL (ref 79–97)
Monocytes Absolute: 0.8 10*3/uL (ref 0.1–0.9)
Monocytes: 8 %
Neutrophils Absolute: 6.1 10*3/uL (ref 1.4–7.0)
Neutrophils: 65 %
Platelets: 358 10*3/uL (ref 150–450)
RBC: 4.82 x10E6/uL (ref 3.77–5.28)
RDW: 14 % (ref 11.7–15.4)
WBC: 9.3 10*3/uL (ref 3.4–10.8)

## 2022-07-31 LAB — LIPASE: Lipase: 36 U/L (ref 14–72)

## 2022-07-31 NOTE — Telephone Encounter (Signed)
Patient was contact via phone. We will do a1c at visit.

## 2022-08-01 ENCOUNTER — Encounter: Payer: Self-pay | Admitting: Endocrinology

## 2022-08-01 DIAGNOSIS — E1165 Type 2 diabetes mellitus with hyperglycemia: Secondary | ICD-10-CM

## 2022-08-02 ENCOUNTER — Other Ambulatory Visit: Payer: Self-pay

## 2022-08-02 ENCOUNTER — Other Ambulatory Visit: Payer: Self-pay | Admitting: Internal Medicine

## 2022-08-02 ENCOUNTER — Encounter: Payer: Self-pay | Admitting: Internal Medicine

## 2022-08-02 MED ORDER — LOSARTAN POTASSIUM 25 MG PO TABS: ORAL_TABLET | ORAL | 0 refills | Status: DC

## 2022-08-02 MED ORDER — METFORMIN HCL ER 500 MG PO TB24: 1000.0000 mg | ORAL_TABLET | Freq: Every day | ORAL | 3 refills | Status: DC

## 2022-08-06 ENCOUNTER — Telehealth: Payer: Self-pay

## 2022-08-06 NOTE — Telephone Encounter (Signed)
Pt called wanting to know the results to her recent lab work. Informed pt that last was out until Thursday and that I would route the message to the provider on call.

## 2022-08-06 NOTE — Telephone Encounter (Signed)
See result note.  

## 2022-08-07 NOTE — Telephone Encounter (Signed)
Sydney Taylor, please arrange for iron/tibc/ferritin at [place]. Patient aware.

## 2022-08-08 ENCOUNTER — Other Ambulatory Visit: Payer: Medicare Other

## 2022-08-08 ENCOUNTER — Other Ambulatory Visit: Payer: Self-pay

## 2022-08-08 DIAGNOSIS — D509 Iron deficiency anemia, unspecified: Secondary | ICD-10-CM

## 2022-08-09 ENCOUNTER — Telehealth: Payer: Self-pay

## 2022-08-09 LAB — IRON,TIBC AND FERRITIN PANEL
Ferritin: 15 ng/mL (ref 15–150)
Iron Saturation: 8 % — CL (ref 15–55)
Iron: 31 ug/dL (ref 27–159)
Total Iron Binding Capacity: 384 ug/dL (ref 250–450)
UIBC: 353 ug/dL (ref 131–425)

## 2022-08-09 LAB — GI PROFILE, STOOL, PCR

## 2022-08-09 NOTE — Telephone Encounter (Signed)
Pt called regarding recent lab results. Pt seen that her iron saturation was low and is wanting to know if she started taking iron supplements would that hopefully help her to start feeling better. Routing to [place] in [employer] absence.

## 2022-08-12 MED ORDER — ONDANSETRON HCL 4 MG PO TABS: 4.0000 mg | ORAL_TABLET | Freq: Three times a day (TID) | ORAL | 1 refills | Status: DC | PRN

## 2022-08-12 MED ORDER — DICYCLOMINE HCL 10 MG PO CAPS: 10.0000 mg | ORAL_CAPSULE | Freq: Three times a day (TID) | ORAL | 1 refills | Status: DC

## 2022-08-12 NOTE — Telephone Encounter (Signed)
Communication noted.  

## 2022-08-13 ENCOUNTER — Encounter: Payer: Self-pay | Admitting: Endocrinology

## 2022-08-13 ENCOUNTER — Ambulatory Visit: Payer: Medicare Other | Admitting: Endocrinology

## 2022-08-14 ENCOUNTER — Encounter: Payer: Self-pay | Admitting: *Deleted

## 2022-08-14 MED ORDER — PEG 3350-KCL-NA BICARB-NACL 420 G PO SOLR: 4000.0000 mL | Freq: Once | ORAL | 0 refills | Status: AC

## 2022-08-15 ENCOUNTER — Telehealth: Payer: Self-pay | Admitting: *Deleted

## 2022-08-15 NOTE — Telephone Encounter (Signed)
Patient is scheduled for colonoscopy for 09/04/22 at 8:15 am.   Authorization # [id number] DOS: 09/04/22-12/03/22

## 2022-08-20 ENCOUNTER — Ambulatory Visit: Payer: Medicare Other | Admitting: Endocrinology

## 2022-08-20 ENCOUNTER — Encounter: Payer: Self-pay | Admitting: Endocrinology

## 2022-08-20 VITALS — BP 118/72 | HR 93 | Ht 69.0 in | Wt 274.0 lb

## 2022-08-20 DIAGNOSIS — Z794 Long term (current) use of insulin: Secondary | ICD-10-CM

## 2022-08-20 DIAGNOSIS — E1165 Type 2 diabetes mellitus with hyperglycemia: Secondary | ICD-10-CM | POA: Diagnosis not present

## 2022-08-20 LAB — POCT GLYCOSYLATED HEMOGLOBIN (HGB A1C): Hemoglobin A1C: 7.1 % — AB (ref 4.0–5.6)

## 2022-08-20 MED ORDER — BYDUREON BCISE 2 MG/0.85ML ~~LOC~~ AUIJ
AUTO-INJECTOR | SUBCUTANEOUS | 3 refills | Status: DC
Start: 2022-08-20 — End: 2022-12-10

## 2022-08-20 MED ORDER — EMPAGLIFLOZIN 25 MG PO TABS: 25.0000 mg | ORAL_TABLET | Freq: Every day | ORAL | 5 refills | Status: DC

## 2022-08-20 NOTE — Patient Instructions (Addendum)
Start indoor aerobics  Recommended blood sugar levels on waking up are 90-130 and about 2 hours after meal is 130-160  Please bring your blood sugar monitor to each visit, thank you  Take 2 Jardiance next Rx '25mg'$ 

## 2022-08-20 NOTE — Progress Notes (Signed)
Patient ID: Sydney Taylor, female   DOB: 01/06/1966, 56 y.o.   MRN: [id number]           Reason for Appointment: Type II Diabetes follow-up   History of Present Illness   Diagnosis date: 2021  Previous history: She had mild hyperglycemia in 2020 but had significant increase in glucose of 484 in 3/21  Non-insulin hypoglycemic drugs previously used: Jardiance since 0/62, Bydureon, Trulicity, metformin, glipizide Insulin was started in 2022  A1c range in the last few years is: 6.4-12.7  Recent history:     Non-insulin hypoglycemic drugs: Jardiance 10 mg daily, metformin ER 1 g at dinner     Insulin regimen: Toujeo 150 units daily          Side effects from medications: Nausea and vomiting with Trulicity  Current self management, blood sugar patterns and problems identified:  A1c is 7.1 and improved She had better blood sugars with some tendency to low sugars a couple of months ago and she reduced her insulin dose on her own However at least in the last 2 weeks she has had some stress issues and is not able to watch her diet at meals and snacks and has some tendency to high sugars periodically in the afternoons or evenings and sometimes for a few hours at a time She has relatively good blood sugars early morning and no hypoglycemia She is currently not doing any exercise She has had no side effects with Jardiance, this has been prescribed by her PCP but has not been given more than 10 mg However she has difficulty taking metformin the morning and is only able to take this in the evening, currently taking 2 tablets; however in the last month she has had some diarrhea for other reasons She tries to try to have some protein at meals such as yogurt, beans, some eggs and tofu She said that previous dietitian did not adviser well on diet  Exercise: none  Diet management: Mealtimes are 7AM, 12N, and 6-7PM..  She is vegetarian     Hypoglycemia:  none    Currently using DEXCOM  G6  Blood sugars are generally lower early morning and then gradually increasing until late evening HYPERGLYCEMIC episodes are occurring sporadically after lunch or dinner No HYPOGLYCEMIA Overnight blood sugars are AVERAGING 171 at midnight and the lowest reading in the mornings 136 between 4-6 AM and no hypoglycemia PREMEAL readings are mildly increased at about 160 at lunchtime but about 170 at dinnertime Generally postprandial blood sugars are quite variable especially in the evenings and tend to be the highest between 5-7 PM with average 187  CGM use % of time 93  2-week average/GV 162/21  Time in range      74%  % Time Above 180 24+2  % Time above 250   % Time Below 70 0     Dietician visit: Most recent: A few years ago  Weight control:  Wt Readings from Last 3 Encounters:  08/20/22 274 lb (124.3 kg)  07/30/22 273 lb 12.8 oz (124.2 kg)  06/28/22 269 lb 12.8 oz (122.4 kg)            Diabetes labs:  Lab Results  Component Value Date   HGBA1C 7.1 (A) 08/20/2022   HGBA1C 8.0 (A) 03/12/2022   HGBA1C 8.5 (H) 12/14/2021   Lab Results  Component Value Date   MICROALBUR 1.1 12/18/2020   LDLCALC 68 12/14/2021   CREATININE 1.01 (H) 07/30/2022    No  results found for: "FRUCTOSAMINE"   Allergies as of 08/20/2022       Reactions   Cephalexin Itching, Swelling, Other (See Comments)        Medication List        Accurate as of August 20, 2022 11:38 AM. If you have any questions, ask your nurse or doctor.          Accu-Chek Guide test strip Generic drug: glucose blood 1 each by Other route in the morning, at noon, and at bedtime. Use as instructed   ALPRAZolam 0.5 MG tablet Commonly known as: XANAX Take 0.25 mg by mouth daily as needed for anxiety.   B-12 Methylcobalamin 1000 MCG Tbdp Take 1,000 mcg by mouth daily.   BC FAST PAIN RELIEF PO Take 1 packet by mouth daily as needed (pain).   blood glucose meter kit and supplies Kit Inject 1 each into the  skin daily. Check blood glucose once a day. Diagnosis code: E 11.9.   Bydureon BCise 2 MG/0.85ML Auij Generic drug: Exenatide ER Inject weekly Started by: Elayne Snare, MD   calcium carbonate 500 MG chewable tablet Commonly known as: TUMS - dosed in mg elemental calcium Chew 1,000 mg by mouth daily as needed for indigestion or heartburn.   chlorthalidone 25 MG tablet Commonly known as: HYGROTON Take 0.5 tablets (12.5 mg total) by mouth daily.   colestipol 1 g tablet Commonly known as: COLESTID Take 2 tablets (2 g total) by mouth daily. Do not take within two hours of other medications.   Dexcom G6 Sensor Misc by Does not apply route.   Dexcom G6 Transmitter Misc by Does not apply route.   dicyclomine 10 MG capsule Commonly known as: BENTYL Take 1 capsule (10 mg total) by mouth 4 (four) times daily -  before meals and at bedtime. For abdominal cramping and loose stool   DULoxetine 60 MG capsule Commonly known as: CYMBALTA Take 60 mg by mouth every morning.   empagliflozin 10 MG Tabs tablet Commonly known as: Jardiance Take 1 tablet (10 mg total) by mouth daily before breakfast.   ibuprofen 200 MG tablet Commonly known as: ADVIL Take 600 mg by mouth every 6 (six) hours as needed for moderate pain or headache.   Klor-Con M20 20 MEQ tablet Generic drug: potassium chloride SA TAKE 2 TABLETS BY MOUTH TWICE A DAY What changed:  how much to take when to take this   L-Methylfolate 15 MG Tabs Take 15 mg by mouth daily at 6 (six) AM.   losartan 25 MG tablet Commonly known as: COZAAR Take 1 tablet by mouth once daily   metFORMIN 500 MG 24 hr tablet Commonly known as: GLUCOPHAGE-XR Take 2 tablets (1,000 mg total) by mouth daily with breakfast. What changed: when to take this   metoprolol tartrate 25 MG tablet Commonly known as: LOPRESSOR Take 1 tablet (25 mg total) by mouth 2 (two) times daily.   Multivitamin Women 50+ Tabs Take 1 tablet by mouth daily.    ondansetron 4 MG tablet Commonly known as: ZOFRAN Take 1 tablet (4 mg total) by mouth every 8 (eight) hours as needed for nausea or vomiting.   pantoprazole 40 MG tablet Commonly known as: PROTONIX Take 1 tablet (40 mg total) by mouth daily.   Pen Needles 32G X 4 MM Misc Check BS 2 x a day   rosuvastatin 10 MG tablet Commonly known as: CRESTOR Take 1 tablet (10 mg total) by mouth daily. What changed: when to take  this   Toujeo Max SoloStar 300 UNIT/ML Solostar Pen Generic drug: insulin glargine (2 Unit Dial) Inject 170 Units into the skin every morning. And pen needles 1/day What changed: how much to take   traZODone 50 MG tablet Commonly known as: DESYREL Take 12.5 mg by mouth at bedtime.        Allergies:  Allergies  Allergen Reactions   Cephalexin Itching, Swelling and Other (See Comments)    Past Medical History:  Diagnosis Date   Anemia    Anxiety    Arrhythmia    Bipolar depression ([place]) 12/28/2019   Chronic fatigue    Chronic headaches    Depression    Diabetes mellitus without complication (HCC)    Fibromyalgia    GERD (gastroesophageal reflux disease)    High cholesterol    HLD (hyperlipidemia)    Hypertension    Insect bite    Migraine    Prediabetes    Sleep apnea    Urinary incontinence     Past Surgical History:  Procedure Laterality Date   CHOLECYSTECTOMY N/A 03/15/2022   Procedure: LAPAROSCOPIC CHOLECYSTECTOMY WITH INTRAOPERATIVE CHOLANGIOGRAM;  Surgeon: Virl Cagey, MD;  Location: [facility];  Service: General;  Laterality: N/A;   COLONOSCOPY  2017   per patient normal and needs next one in 2027 (Dr. Posey Pronto)   ESOPHAGOGASTRODUODENOSCOPY N/A 05/12/2020   Procedure: ESOPHAGOGASTRODUODENOSCOPY (EGD);  Surgeon: Daneil Dolin, MD;  Location: [facility];  Service: Endoscopy;  Laterality: N/A;  11:15am   Joint fusion on foot Right    LIVER BIOPSY N/A 03/15/2022   Procedure: LIVER BIOPSY;  Surgeon: Virl Cagey, MD;  Location: [facility];  Service: General;  Laterality: N/A;   MALONEY DILATION N/A 05/12/2020   Procedure: Keturah Shavers;  Surgeon: Daneil Dolin, MD;  Location: [facility];  Service: Endoscopy;  Laterality: N/A;   NASAL SEPTUM SURGERY     SINOSCOPY      Family History  Problem Relation Age of Onset   Hypertension Father    Hyperlipidemia Father    Diabetes Father    Stroke Father    Depression Father    Anxiety disorder Father    Hypertension Mother    Hyperlipidemia Mother    Diabetes Mellitus II Mother    Congestive Heart Failure Mother    Heart attack Mother    Hypertension Sister    Diabetes Sister    Hypertension Brother    Autoimmune disease Brother    Diabetes Maternal Grandmother    Lupus Paternal Grandmother    Depression Paternal Grandmother    Anxiety disorder Paternal Grandmother    Depression Maternal Aunt    Anxiety disorder Maternal Aunt    Colon cancer Neg Hx    Esophageal cancer Neg Hx    Gastric cancer Neg Hx     Social History:  reports that she quit smoking about 39 years ago. Her smoking use included cigarettes. She has never used smokeless tobacco. She reports that she does not currently use alcohol. She reports that she does not use drugs.  Review of Systems:  Last diabetic eye exam date 6/22  Last urine microalbumin date: 4/23  Last foot exam date: 10/22  Symptoms of neuropathy: None  Hypertension:   ACE/ARB medication: Losartan 25 mg daily  BP Readings from Last 3 Encounters:  08/20/22 118/72  07/30/22 (!) 144/82  06/28/22 128/84    Lipid management: Rosuvastatin and colestipol    Lab Results  Component Value Date  CHOL 136 12/14/2021   CHOL 208 (H) 01/21/2020   CHOL 205 (A) 08/26/2016   Lab Results  Component Value Date   HDL 36 (L) 12/14/2021   HDL 40 (L) 01/21/2020   HDL 54 08/26/2016   Lab Results  Component Value Date   LDLCALC 68 12/14/2021   LDLCALC 133 (H) 01/21/2020   LDLCALC 122 08/26/2016   Lab Results  Component  Value Date   TRIG 189 (H) 12/14/2021   TRIG 208 (H) 01/21/2020   TRIG 145 08/26/2016   Lab Results  Component Value Date   CHOLHDL 3.8 12/14/2021   CHOLHDL 5.2 (H) 01/21/2020   No results found for: "LDLDIRECT"   Fibrosis 4 Score = .57 (Low risk)        Interpretation for patients with NAFLD          <1.30       -  F0-F1 (Low risk)          1.30-2.67 -  Indeterminate           >2.67      -  F3-F4 (High risk)     Validated for ages 62-65      Examination:   BP 118/72 (BP Location: Left Arm, Patient Position: Sitting, Cuff Size: Normal)   Pulse 93   Ht _0  (1.753 m)   Wt 274 lb (124.3 kg)   LMP  (LMP Unknown)   SpO2 96%   BMI 40.46 kg/m   Body mass index is 40.46 kg/m.    ASSESSMENT/ PLAN:    Diabetes type 2 insulin requiring:   Current regimen: Insulin, Jardiance and metformin  See history of present illness for detailed discussion of current diabetes management, blood sugar patterns from her Dexcom download and problems identified  A1c is 7.1 Her GMI recently 7.2 but she has tendency to postprandial hyperglycemia not controlled with just taking basal insulin and low-dose Jardiance and metformin Recently is not able to watch her diet as well and has difficulty losing weight Discussed importance of weight loss for long-term reduction of insulin resistance and its complications as well as avoiding progression to further insulin deficiency She has had nausea with Trulicity previously but not Bydureon  Recommendations:  She will need to maximize efforts to lose weight Currently not on GLP-1 drugs and will benefit from this Since she has tried Bydureon without any nausea previously we will start her back on 2 mg weekly Discussed benefits of GLP-1 drugs modes of action, benefits of long-term weight loss and indirect benefits on cardiovascular risk and liver steatosis She does need to start an exercise program with some form of aerobic activity at home such as a  YouTube video Increase Jardiance to 2 pills and next prescription will be for 25 mg daily for maximizing the benefit from this She will monitor blood pressure and let us know if it is going down significantly and may consider stopping losartan Consultation with diabetes educator/nutritionist for meal planning and general diabetes education Make sure she has some protein with every meal If morning sugars start getting below 100 she will reduce her insulin by 10 units Discussed postprandial blood sugar targets  HYPERTENSION: Well-controlled  Dyslipidemia: Lipids were at target on the last measurement in 12/22   Patient Instructions  Start indoor aerobics  Recommended blood sugar levels on waking up are 90-130 and about 2 hours after meal is 130-160  Please bring your blood sugar monitor to each visit, thank you  Take 2  Jardiance next Rx 13m   Total visit time including counseling = 30 minutes  AElayne Snare8/22/2023, 11:38 AM

## 2022-08-21 ENCOUNTER — Encounter: Payer: Self-pay | Admitting: Endocrinology

## 2022-08-21 NOTE — Telephone Encounter (Signed)
Mindy, Please let patient know that she will have the follow diabetic instructions. The other instructions are the same as before including holding iron, dicyclomine, colestid.   -She will still hold jardiance for 72 hours.  -She will hold bydureon 7 days before procedure (procedure can be done on day 8 or after per protocol).  -Day of prep: Toujeo 75 units in the am, metformin 1/2 tablet before dinner.   AM of colonoscopy: all diabetes medications.  PLEASE MAKE SURE PREP WAS SENT IN

## 2022-08-22 MED ORDER — NA SULFATE-K SULFATE-MG SULF 17.5-3.13-1.6 GM/177ML PO SOLN: ORAL | 0 refills | Status: DC

## 2022-08-22 NOTE — Addendum Note (Signed)
Addended by: Cheron Every on: 08/22/2022 07:48 AM   Modules accepted: Orders

## 2022-08-23 ENCOUNTER — Encounter: Payer: Self-pay | Admitting: Endocrinology

## 2022-08-26 ENCOUNTER — Other Ambulatory Visit: Payer: Self-pay | Admitting: Internal Medicine

## 2022-08-26 NOTE — Progress Notes (Deleted)
Office Visit Note  Patient: Sydney Taylor             Date of Birth: September 16, 1966           MRN: [id number]             PCP: Anabel Halon, MD Referring: Anabel Halon, MD Visit Date: 09/03/2022   Subjective:  No chief complaint on file.   History of Present Illness: Sydney Taylor is a 56 y.o. female here for follow up or her chronic fatigue and fibromyalgia symptoms with elevated inflammatory markers   Previous HPI 04/10/2022 Sydney Taylor is a 56 y.o. female here for follow up for her chronic fatigue and fibromyalgia symptoms with elevated inflammatory markers. At last visit she did not have any evidence of active inflammatory disease but we are following up to monitor because of abnormal labs. She was referred to physical therapy for her FMS symptoms also for bilateral knee osteoarthritis but was not able to keep this original appointment. Since last visit she feels her muscle pain is overall worse. Her legs feel weak and not very stable a lot of the time. No falls reported.   11/2020 ESR wnl CRP 11.7   08/2020 ANA neg RF neg   No Rheumatology ROS completed.   PMFS History:  Patient Active Problem List   Diagnosis Date Noted   Abdominal pain, epigastric 07/30/2022   Diarrhea 07/30/2022   Rash and nonspecific skin eruption 06/07/2022   Essential hypertension 06/03/2022   Fatty liver 03/05/2022   Gallstone pancreatitis 03/04/2022   Acute pancreatitis 02/12/2022   Encounter for general adult medical examination with abnormal findings 02/01/2022   Plantar fasciitis 02/01/2022   Chest pain 01/14/2022   Chronic migraine without aura without status migrainosus, not intractable 10/29/2021   Diabetic polyneuropathy associated with type 2 diabetes mellitus (HCC) 09/19/2021   History of hormone therapy 09/19/2021   Bilateral primary osteoarthritis of knee 04/10/2021   Perennial allergic rhinitis 04/06/2021   Allergic contact dermatitis 04/06/2021   Cholinergic  urticaria 04/06/2021   Fibromyalgia syndrome 01/12/2021   Elevated C-reactive protein (CRP) 01/12/2021   Diverticulosis of colon without hemorrhage 01/02/2021   Major depressive disorder, single episode, severe (HCC) 07/14/2020   GERD (gastroesophageal reflux disease) 05/11/2020   Type 2 diabetes mellitus (HCC) 03/27/2020   Bipolar depression (HCC) 12/28/2019   Sleep apnea 12/28/2019   Tachycardia 10/31/2019   Hyperlipidemia 10/30/2015   Morbid obesity (HCC) 10/30/2015    Past Medical History:  Diagnosis Date   Anemia    Anxiety    Arrhythmia    Bipolar depression (HCC) 12/28/2019   Chronic fatigue    Chronic headaches    Depression    Diabetes mellitus without complication (HCC)    Fibromyalgia    GERD (gastroesophageal reflux disease)    High cholesterol    HLD (hyperlipidemia)    Hypertension    Insect bite    Migraine    Prediabetes    Sleep apnea    Urinary incontinence     Family History  Problem Relation Age of Onset   Hypertension Father    Hyperlipidemia Father    Diabetes Father    Stroke Father    Depression Father    Anxiety disorder Father    Hypertension Mother    Hyperlipidemia Mother    Diabetes Mellitus II Mother    Congestive Heart Failure Mother    Heart attack Mother    Hypertension Sister    Diabetes  Sister    Hypertension Brother    Autoimmune disease Brother    Diabetes Maternal Grandmother    Lupus Paternal Grandmother    Depression Paternal Grandmother    Anxiety disorder Paternal Grandmother    Depression Maternal Aunt    Anxiety disorder Maternal Aunt    Colon cancer Neg Hx    Esophageal cancer Neg Hx    Gastric cancer Neg Hx    Past Surgical History:  Procedure Laterality Date   CHOLECYSTECTOMY N/A 03/15/2022   Procedure: LAPAROSCOPIC CHOLECYSTECTOMY WITH INTRAOPERATIVE CHOLANGIOGRAM;  Surgeon: Virl Cagey, MD;  Location: [facility];  Service: General;  Laterality: N/A;   COLONOSCOPY  2017   per patient normal and needs  next one in 2027 (Dr. Posey Pronto)   ESOPHAGOGASTRODUODENOSCOPY N/A 05/12/2020   Procedure: ESOPHAGOGASTRODUODENOSCOPY (EGD);  Surgeon: Daneil Dolin, MD;  Location: [facility];  Service: Endoscopy;  Laterality: N/A;  11:15am   Joint fusion on foot Right    LIVER BIOPSY N/A 03/15/2022   Procedure: LIVER BIOPSY;  Surgeon: Virl Cagey, MD;  Location: [facility];  Service: General;  Laterality: N/A;   MALONEY DILATION N/A 05/12/2020   Procedure: Keturah Shavers;  Surgeon: Daneil Dolin, MD;  Location: [facility];  Service: Endoscopy;  Laterality: N/A;   NASAL SEPTUM SURGERY     SINOSCOPY     Social History   Social History Narrative   Married for 3 years.Lives with husband.Bachelors in Education officer, museum.   Right handed   Caffeine: 1-2 per day    Immunization History  Administered Date(s) Administered   Influenza Whole 10/30/2021   Influenza,inj,Quad PF,6+ Mos 11/01/2019   Influenza-Unspecified 10/30/2020   Moderna Covid-19 Vaccine Bivalent Booster 57yrs & up 05/30/2022   Moderna Sars-Covid-2 Vaccination 05/13/2020, 06/23/2020, 07/12/2021   Pneumococcal Polysaccharide-23 12/18/2020   Td 12/30/2020   Zoster Recombinat (Shingrix) 12/30/2020, 05/29/2021     Objective: Vital Signs: LMP  (LMP Unknown)    Physical Exam   Musculoskeletal Exam: ***  CDAI Exam: CDAI Score: -- Patient Global: --; Provider Global: -- Swollen: --; Tender: -- Joint Exam 09/03/2022   No joint exam has been documented for this visit   There is currently no information documented on the homunculus. Go to the Rheumatology activity and complete the homunculus joint exam.  Investigation: No additional findings.  Imaging: No results found.  Recent Labs: Lab Results  Component Value Date   WBC 9.3 07/30/2022   HGB 12.6 07/30/2022   PLT 358 07/30/2022   NA 141 07/30/2022   K 4.2 07/30/2022   CL 100 07/30/2022   CO2 25 07/30/2022   GLUCOSE 116 (H) 07/30/2022   BUN 14 07/30/2022   CREATININE 1.01  (H) 07/30/2022   BILITOT 0.3 07/30/2022   ALKPHOS 97 07/30/2022   AST 16 07/30/2022   ALT 20 07/30/2022   PROT 6.4 07/30/2022   ALBUMIN 4.3 07/30/2022   CALCIUM 9.4 07/30/2022   GFRAA 89 03/19/2021    Speciality Comments: No specialty comments available.  Procedures:  No procedures performed Allergies: Cephalexin   Assessment / Plan:     Visit Diagnoses: No diagnosis found.  ***  Orders: No orders of the defined types were placed in this encounter.  No orders of the defined types were placed in this encounter.    Follow-Up Instructions: No follow-ups on file.   Bertram Savin, RT  Note - This record has been created using Editor, commissioning.  Chart creation errors have been sought, but may not  always  have been located. Such creation errors do not reflect on  the standard of medical care.

## 2022-08-30 ENCOUNTER — Encounter (HOSPITAL_COMMUNITY)
Admission: RE | Admit: 2022-08-30 | Discharge: 2022-08-30 | Disposition: A | Discharge status: Home / Self Care | Payer: Medicare Other | Source: Ambulatory Visit | Attending: Internal Medicine | Admitting: Internal Medicine

## 2022-09-03 ENCOUNTER — Ambulatory Visit: Payer: Medicare Other | Admitting: Internal Medicine

## 2022-09-03 DIAGNOSIS — M170 Bilateral primary osteoarthritis of knee: Secondary | ICD-10-CM

## 2022-09-03 DIAGNOSIS — Z0000 Encounter for general adult medical examination without abnormal findings: Secondary | ICD-10-CM

## 2022-09-03 DIAGNOSIS — R7982 Elevated C-reactive protein (CRP): Secondary | ICD-10-CM

## 2022-09-03 DIAGNOSIS — M797 Fibromyalgia: Secondary | ICD-10-CM

## 2022-09-04 ENCOUNTER — Ambulatory Visit (HOSPITAL_COMMUNITY)
Admission: RE | Admit: 2022-09-04 | Discharge: 2022-09-04 | Disposition: A | Discharge status: Home / Self Care | Payer: Medicare Other | Attending: Internal Medicine | Admitting: Internal Medicine

## 2022-09-04 ENCOUNTER — Ambulatory Visit (HOSPITAL_BASED_OUTPATIENT_CLINIC_OR_DEPARTMENT_OTHER): Payer: Medicare Other | Admitting: Anesthesiology

## 2022-09-04 ENCOUNTER — Ambulatory Visit (HOSPITAL_COMMUNITY): Payer: Medicare Other | Admitting: Anesthesiology

## 2022-09-04 ENCOUNTER — Encounter (HOSPITAL_COMMUNITY)
Admission: RE | Disposition: A | Discharge status: Home / Self Care | Payer: Self-pay | Source: Home / Self Care | Attending: Internal Medicine

## 2022-09-04 DIAGNOSIS — K219 Gastro-esophageal reflux disease without esophagitis: Secondary | ICD-10-CM | POA: Diagnosis not present

## 2022-09-04 DIAGNOSIS — Z87891 Personal history of nicotine dependence: Secondary | ICD-10-CM | POA: Diagnosis not present

## 2022-09-04 DIAGNOSIS — Q438 Other specified congenital malformations of intestine: Secondary | ICD-10-CM | POA: Insufficient documentation

## 2022-09-04 DIAGNOSIS — Z794 Long term (current) use of insulin: Secondary | ICD-10-CM | POA: Diagnosis not present

## 2022-09-04 DIAGNOSIS — Z9049 Acquired absence of other specified parts of digestive tract: Secondary | ICD-10-CM | POA: Diagnosis not present

## 2022-09-04 DIAGNOSIS — K529 Noninfective gastroenteritis and colitis, unspecified: Secondary | ICD-10-CM | POA: Insufficient documentation

## 2022-09-04 DIAGNOSIS — R197 Diarrhea, unspecified: Secondary | ICD-10-CM

## 2022-09-04 DIAGNOSIS — F419 Anxiety disorder, unspecified: Secondary | ICD-10-CM | POA: Insufficient documentation

## 2022-09-04 DIAGNOSIS — G4730 Sleep apnea, unspecified: Secondary | ICD-10-CM | POA: Diagnosis not present

## 2022-09-04 DIAGNOSIS — K635 Polyp of colon: Secondary | ICD-10-CM

## 2022-09-04 DIAGNOSIS — F319 Bipolar disorder, unspecified: Secondary | ICD-10-CM | POA: Diagnosis not present

## 2022-09-04 DIAGNOSIS — D122 Benign neoplasm of ascending colon: Secondary | ICD-10-CM

## 2022-09-04 DIAGNOSIS — Z6841 Body Mass Index (BMI) 40.0 and over, adult: Secondary | ICD-10-CM | POA: Diagnosis not present

## 2022-09-04 DIAGNOSIS — I10 Essential (primary) hypertension: Secondary | ICD-10-CM | POA: Insufficient documentation

## 2022-09-04 DIAGNOSIS — Z79899 Other long term (current) drug therapy: Secondary | ICD-10-CM | POA: Insufficient documentation

## 2022-09-04 DIAGNOSIS — D124 Benign neoplasm of descending colon: Secondary | ICD-10-CM | POA: Insufficient documentation

## 2022-09-04 DIAGNOSIS — E119 Type 2 diabetes mellitus without complications: Secondary | ICD-10-CM | POA: Insufficient documentation

## 2022-09-04 DIAGNOSIS — Z7984 Long term (current) use of oral hypoglycemic drugs: Secondary | ICD-10-CM | POA: Insufficient documentation

## 2022-09-04 HISTORY — PX: POLYPECTOMY: SHX5525

## 2022-09-04 HISTORY — PX: BIOPSY: SHX5522

## 2022-09-04 HISTORY — PX: COLONOSCOPY WITH PROPOFOL: SHX5780

## 2022-09-04 LAB — GLUCOSE, CAPILLARY: Glucose-Capillary: 134 mg/dL — ABNORMAL HIGH (ref 70–99)

## 2022-09-04 SURGERY — COLONOSCOPY WITH PROPOFOL
Anesthesia: General

## 2022-09-04 MED ORDER — ONDANSETRON HCL 4 MG/2ML IJ SOLN
4.0000 mg | Freq: Once | INTRAMUSCULAR | Status: AC
  Administered 2022-09-04: 4 mg via INTRAVENOUS

## 2022-09-04 MED ORDER — LIDOCAINE HCL (CARDIAC) PF 100 MG/5ML IV SOSY
PREFILLED_SYRINGE | INTRAVENOUS | Status: DC | PRN
  Administered 2022-09-04: 50 mg via INTRATRACHEAL

## 2022-09-04 MED ORDER — LACTATED RINGERS IV SOLN: INTRAVENOUS | Status: DC

## 2022-09-04 MED ORDER — PROPOFOL 10 MG/ML IV BOLUS
INTRAVENOUS | Status: DC | PRN
  Administered 2022-09-04: 110 mg via INTRAVENOUS

## 2022-09-04 MED ORDER — PROPOFOL 500 MG/50ML IV EMUL
INTRAVENOUS | Status: DC | PRN
  Administered 2022-09-04: 200 ug/kg/min via INTRAVENOUS

## 2022-09-04 MED ORDER — ONDANSETRON HCL 4 MG/2ML IJ SOLN
INTRAMUSCULAR | Status: DC
Start: 2022-09-04 — End: 2022-09-04
  Filled 2022-09-04: qty 2

## 2022-09-04 MED ORDER — METOCLOPRAMIDE HCL 5 MG/ML IJ SOLN
INTRAMUSCULAR | Status: AC
  Filled 2022-09-04: qty 2

## 2022-09-04 NOTE — Discharge Instructions (Signed)
  Colonoscopy Discharge Instructions  Read the instructions outlined below and refer to this sheet in the next few weeks. These discharge instructions provide you with general information on caring for yourself after you leave the hospital. Your doctor may also give you specific instructions. While your treatment has been planned according to the most current medical practices available, unavoidable complications occasionally occur. If you have any problems or questions after discharge, call Dr. Gala Romney at [phone]. ACTIVITY You may resume your regular activity, but move at a slower pace for the next 24 hours.  Take frequent rest periods for the next 24 hours.  Walking will help get rid of the air and reduce the bloated feeling in your belly (abdomen).  No driving for 24 hours (because of the medicine (anesthesia) used during the test).   Do not sign any important legal documents or operate any machinery for 24 hours (because of the anesthesia used during the test).  NUTRITION Drink plenty of fluids.  You may resume your normal diet as instructed by your doctor.  Begin with a light meal and progress to your normal diet. Heavy or fried foods are harder to digest and may make you feel sick to your stomach (nauseated).  Avoid alcoholic beverages for 24 hours or as instructed.  MEDICATIONS You may resume your normal medications unless your doctor tells you otherwise.  WHAT YOU CAN EXPECT TODAY Some feelings of bloating in the abdomen.  Passage of more gas than usual.  Spotting of blood in your stool or on the toilet paper.  IF YOU HAD POLYPS REMOVED DURING THE COLONOSCOPY: No aspirin products for 7 days or as instructed.  No alcohol for 7 days or as instructed.  Eat a soft diet for the next 24 hours.  FINDING OUT THE RESULTS OF YOUR TEST Not all test results are available during your visit. If your test results are not back during the visit, make an appointment with your caregiver to find out the  results. Do not assume everything is normal if you have not heard from your caregiver or the medical facility. It is important for you to follow up on all of your test results.  SEEK IMMEDIATE MEDICAL ATTENTION IF: You have more than a spotting of blood in your stool.  Your belly is swollen (abdominal distention).  You are nauseated or vomiting.  You have a temperature over 101.  You have abdominal pain or discomfort that is severe or gets worse throughout the day.     1 polyp removed from your colon  Samples taken to evaluate the cause of diarrhea   further recommendations to follow pending review of pathology report  Office appointment with Neil Crouch in  6 weeks

## 2022-09-04 NOTE — Anesthesia Preprocedure Evaluation (Signed)
Anesthesia Evaluation  Patient identified by MRN, date of birth, ID band Patient awake    Reviewed: Allergy & Precautions, NPO status , Patient's Chart, lab work & pertinent test results, reviewed documented beta blocker date and time   Airway Mallampati: III  TM Distance: >3 FB Neck ROM: Full    Dental  (+) Teeth Intact, Dental Advisory Given   Pulmonary sleep apnea , former smoker,    Pulmonary exam normal breath sounds clear to auscultation       Cardiovascular hypertension, Pt. on medications and Pt. on home beta blockers Normal cardiovascular exam Rhythm:Regular Rate:Normal     Neuro/Psych  Headaches, PSYCHIATRIC DISORDERS Anxiety Depression Bipolar Disorder  Neuromuscular disease    GI/Hepatic Neg liver ROS, GERD  ,  Endo/Other  diabetes, Well Controlled, Type 2, Oral Hypoglycemic Agents, Insulin DependentMorbid obesity  Renal/GU negative Renal ROS  negative genitourinary   Musculoskeletal  (+) Arthritis , Osteoarthritis,  Fibromyalgia -  Abdominal   Peds negative pediatric ROS (+)  Hematology  (+) Blood dyscrasia, anemia ,   Anesthesia Other Findings   Reproductive/Obstetrics negative OB ROS                             Anesthesia Physical Anesthesia Plan  ASA: 3  Anesthesia Plan: General   Post-op Pain Management: Minimal or no pain anticipated   Induction: Intravenous  PONV Risk Score and Plan: Propofol infusion  Airway Management Planned: Nasal Cannula and Natural Airway  Additional Equipment:   Intra-op Plan:   Post-operative Plan:   Informed Consent: I have reviewed the patients History and Physical, chart, labs and discussed the procedure including the risks, benefits and alternatives for the proposed anesthesia with the patient or authorized representative who has indicated his/her understanding and acceptance.     Dental advisory given  Plan Discussed with:  CRNA and Surgeon  Anesthesia Plan Comments:         Anesthesia Quick Evaluation

## 2022-09-04 NOTE — Transfer of Care (Signed)
Immediate Anesthesia Transfer of Care Note  Patient: Sydney Taylor  Procedure(s) Performed: COLONOSCOPY WITH PROPOFOL POLYPECTOMY BIOPSY  Patient Location: Short Stay  Anesthesia Type:General  Level of Consciousness: awake, alert , oriented and patient cooperative  Airway & Oxygen Therapy: Patient Spontanous Breathing and Patient connected to face mask oxygen  Post-op Assessment: Report given to RN, Post -op Vital signs reviewed and stable and Patient moving all extremities  Post vital signs: Reviewed and stable  Last Vitals:  Vitals Value Taken Time  BP 90/48 09/04/22 0853  Temp 37.2 C 09/04/22 0853  Pulse 86 09/04/22 0853  Resp 21 09/04/22 0853  SpO2 93 % 09/04/22 0853    Last Pain:  Vitals:   09/04/22 0853  TempSrc: Oral  PainSc: 0-No pain      Patients Stated Pain Goal: 5 (70/92/95 7473)  Complications: No notable events documented.

## 2022-09-04 NOTE — Anesthesia Postprocedure Evaluation (Signed)
Anesthesia Post Note  Patient: Sydney Taylor  Procedure(s) Performed: COLONOSCOPY WITH PROPOFOL POLYPECTOMY BIOPSY  Patient location during evaluation: Phase II Anesthesia Type: General Level of consciousness: awake and alert and oriented Pain management: pain level controlled Vital Signs Assessment: post-procedure vital signs reviewed and stable Respiratory status: spontaneous breathing, nonlabored ventilation and respiratory function stable Cardiovascular status: blood pressure returned to baseline and stable Postop Assessment: no apparent nausea or vomiting Anesthetic complications: no   No notable events documented.   Last Vitals:  Vitals:   09/04/22 0707 09/04/22 0853  BP: 131/70 (!) 90/48  Pulse: 80 86  Resp: 18 (!) 21  Temp: 37.2 C 37.2 C  SpO2: 93% 93%    Last Pain:  Vitals:   09/04/22 0853  TempSrc: Oral  PainSc: 0-No pain                 Wylan Gentzler C Zlatan Hornback

## 2022-09-04 NOTE — H&P (Signed)
@LOGO @   Primary Care Physician:  Lindell Spar, MD Primary Gastroenterologist:  Dr. Gala Romney  Pre-Procedure History & Physical: HPI:  Sydney Taylor is a 56 y.o. female here for further evaluation of chronic diarrhea.  No blood per rectum.  Some worsening of diarrhea after gallbladder removed.  Negative colonoscopy 2017.  Past Medical History:  Diagnosis Date   Anemia    Anxiety    Arrhythmia    Bipolar depression ([place]) 12/28/2019   Chronic fatigue    Chronic headaches    Depression    Diabetes mellitus without complication (HCC)    Fibromyalgia    GERD (gastroesophageal reflux disease)    High cholesterol    HLD (hyperlipidemia)    Hypertension    Insect bite    Migraine    Prediabetes    Sleep apnea    Urinary incontinence     Past Surgical History:  Procedure Laterality Date   CHOLECYSTECTOMY N/A 03/15/2022   Procedure: LAPAROSCOPIC CHOLECYSTECTOMY WITH INTRAOPERATIVE CHOLANGIOGRAM;  Surgeon: Virl Cagey, MD;  Location: [facility];  Service: General;  Laterality: N/A;   COLONOSCOPY  2017   per patient normal and needs next one in 2027 (Dr. Posey Pronto)   ESOPHAGOGASTRODUODENOSCOPY N/A 05/12/2020   Procedure: ESOPHAGOGASTRODUODENOSCOPY (EGD);  Surgeon: Daneil Dolin, MD;  Location: [facility];  Service: Endoscopy;  Laterality: N/A;  11:15am   Joint fusion on foot Right    LIVER BIOPSY N/A 03/15/2022   Procedure: LIVER BIOPSY;  Surgeon: Virl Cagey, MD;  Location: [facility];  Service: General;  Laterality: N/A;   MALONEY DILATION N/A 05/12/2020   Procedure: Keturah Shavers;  Surgeon: Daneil Dolin, MD;  Location: [facility];  Service: Endoscopy;  Laterality: N/A;   NASAL SEPTUM SURGERY     SINOSCOPY      Prior to Admission medications   Medication Sig Start Date End Date Taking? Authorizing Provider  chlorthalidone (HYGROTON) 25 MG tablet Take 0.5 tablets (12.5 mg total) by mouth daily. 04/25/22  Yes Patel, Colin Broach, MD  insulin glargine, 2 Unit Dial,  (TOUJEO MAX SOLOSTAR) 300 UNIT/ML Solostar Pen Inject 170 Units into the skin every morning. And pen needles 1/day Patient taking differently: Inject 150 Units into the skin every morning. And pen needles 1/day 05/24/22  Yes Elayne Snare, MD  losartan (COZAAR) 25 MG tablet Take 1 tablet by mouth once daily 08/02/22  Yes Lindell Spar, MD  metFORMIN (GLUCOPHAGE-XR) 500 MG 24 hr tablet Take 2 tablets (1,000 mg total) by mouth daily with breakfast. Patient taking differently: Take 1,000 mg by mouth daily with supper. 08/02/22  Yes Elayne Snare, MD  metoprolol tartrate (LOPRESSOR) 25 MG tablet Take 1 tablet (25 mg total) by mouth 2 (two) times daily. 02/28/22 09/04/22 Yes Fay Records, MD  Multiple Vitamins-Minerals (MULTIVITAMIN WOMEN 50+) TABS Take 1 tablet by mouth daily.   Yes [provider]  Na Sulfate-K Sulfate-Mg Sulf 17.5-3.13-1.6 GM/177ML SOLN As directed 08/22/22  Yes Makinsley Schiavi, Cristopher Estimable, MD  pantoprazole (PROTONIX) 40 MG tablet Take 1 tablet by mouth once daily 08/26/22  Yes Lindell Spar, MD  rosuvastatin (CRESTOR) 10 MG tablet Take 1 tablet (10 mg total) by mouth daily. Patient taking differently: Take 10 mg by mouth every other day. 11/07/21  Yes Lindell Spar, MD  traZODone (DESYREL) 50 MG tablet Take 12.5 mg by mouth at bedtime. 10/08/21  Yes [provider]  ALPRAZolam Duanne Moron) 0.5 MG tablet Take 0.25 mg by mouth daily  as needed for anxiety. 07/05/20   [provider]  Aspirin-Caffeine (BC FAST PAIN RELIEF PO) Take 1 packet by mouth daily as needed (pain).    [provider]  B-12 Methylcobalamin 1000 MCG TBDP Take 1,000 mcg by mouth daily.    [provider]  blood glucose meter kit and supplies KIT Inject 1 each into the skin daily. Check blood glucose once a day. Diagnosis code: E 11.9. 02/21/21   Hurshel Party C, MD  calcium carbonate (TUMS - DOSED IN MG ELEMENTAL CALCIUM) 500 MG chewable tablet Chew 1,000 mg by mouth daily as needed for indigestion  or heartburn.    [provider]  colestipol (COLESTID) 1 g tablet Take 2 tablets (2 g total) by mouth daily. Do not take within two hours of other medications. 07/30/22   Mahala Menghini, PA-C  Continuous Blood Gluc Sensor (DEXCOM G6 SENSOR) MISC by Does not apply route.    [provider]  Continuous Blood Gluc Transmit (DEXCOM G6 TRANSMITTER) MISC by Does not apply route.    [provider]  dicyclomine (BENTYL) 10 MG capsule Take 1 capsule (10 mg total) by mouth 4 (four) times daily -  before meals and at bedtime. For abdominal cramping and loose stool 08/12/22   Annitta Needs, NP  DULoxetine (CYMBALTA) 60 MG capsule Take 60 mg by mouth every morning. 05/14/21   [provider]  empagliflozin (JARDIANCE) 25 MG TABS tablet Take 1 tablet (25 mg total) by mouth daily before breakfast. 08/20/22   Elayne Snare, MD  Exenatide ER (BYDUREON BCISE) 2 MG/0.85ML AUIJ Inject weekly 08/20/22   Elayne Snare, MD  glucose blood (ACCU-CHEK GUIDE) test strip 1 each by Other route in the morning, at noon, and at bedtime. Use as instructed 08/07/21   Ailene Ards, NP  ibuprofen (ADVIL) 200 MG tablet Take 600 mg by mouth every 6 (six) hours as needed for moderate pain or headache.    [provider]  Insulin Pen Needle (PEN NEEDLES) 32G X 4 MM MISC Check BS 2 x a day 10/22/21   Renato Shin, MD  KLOR-CON M20 20 MEQ tablet TAKE 2 TABLETS BY MOUTH TWICE A DAY Patient taking differently: Take 40 mEq by mouth daily. 01/21/22   Lindell Spar, MD  L-Methylfolate 15 MG TABS Take 15 mg by mouth daily at 6 (six) AM.    [provider]  ondansetron (ZOFRAN) 4 MG tablet Take 1 tablet (4 mg total) by mouth every 8 (eight) hours as needed for nausea or vomiting. 08/12/22   Annitta Needs, NP    Allergies as of 08/14/2022 - Review Complete 07/30/2022  Allergen Reaction Noted   Cephalexin Itching, Swelling, and Other (See Comments) 05/02/2020    Family History  Problem Relation  Age of Onset   Hypertension Father    Hyperlipidemia Father    Diabetes Father    Stroke Father    Depression Father    Anxiety disorder Father    Hypertension Mother    Hyperlipidemia Mother    Diabetes Mellitus II Mother    Congestive Heart Failure Mother    Heart attack Mother    Hypertension Sister    Diabetes Sister    Hypertension Brother    Autoimmune disease Brother    Diabetes Maternal Grandmother    Lupus Paternal Grandmother    Depression Paternal Grandmother    Anxiety disorder Paternal Grandmother    Depression Maternal Aunt    Anxiety disorder  Maternal Aunt    Colon cancer Neg Hx    Esophageal cancer Neg Hx    Gastric cancer Neg Hx     Social History   Socioeconomic History   Marital status: Married    Spouse name: Not on file   Number of children: 0   Years of education: Not on file   Highest education level: Bachelor's degree (e.g., BA, AB, BS)  Occupational History   Occupation: research associate/drug development  Tobacco Use   Smoking status: Former    Types: Cigarettes    Quit date: 12/30/1982    Years since quitting: 39.7   Smokeless tobacco: Never  Vaping Use   Vaping Use: Never used  Substance and Sexual Activity   Alcohol use: Not Currently    Alcohol/week: 0.0 standard drinks of alcohol   Drug use: Never   Sexual activity: Yes    Birth control/protection: Post-menopausal  Other Topics Concern   Not on file  Social History Narrative   Married for 3 years.Lives with husband.Bachelors in Education officer, museum.   Right handed   Caffeine: 1-2 per day    Social Determinants of Health   Financial Resource Strain: Low Risk  (06/04/2022)   Overall Financial Resource Strain (CARDIA)    Difficulty of Paying Living Expenses: Not hard at all  Food Insecurity: No Food Insecurity (06/04/2022)   Hunger Vital Sign    Worried About Running Out of Food in the Last Year: Never true    Ran Out of Food in the Last Year: Never true  Transportation Needs: No  Transportation Needs (06/04/2022)   PRAPARE - Hydrologist (Medical): No    Lack of Transportation (Non-Medical): No  Physical Activity: Inactive (06/04/2022)   Exercise Vital Sign    Days of Exercise per Week: 0 days    Minutes of Exercise per Session: 0 min  Stress: No Stress Concern Present (06/04/2022)   [place]    Feeling of Stress : Not at all  Social Connections: Moderately Integrated (06/04/2022)   Social Connection and Isolation Panel [NHANES]    Frequency of Communication with Friends and Family: More than three times a week    Frequency of Social Gatherings with Friends and Family: More than three times a week    Attends Religious Services: More than 4 times per year    Active Member of [employer] or Organizations: No    Attends Archivist Meetings: Never    Marital Status: Married  Human resources officer Violence: Not At Risk (06/04/2022)   Humiliation, Afraid, Rape, and Kick questionnaire    Fear of Current or Ex-Partner: No    Emotionally Abused: No    Physically Abused: No    Sexually Abused: No    Review of Systems: See HPI, otherwise negative ROS  Physical Exam: BP 131/70   Pulse 80   Temp 99 F (37.2 C) (Oral)   Resp 18   LMP  (LMP Unknown)   SpO2 93%  General:   Alert,  Well-developed, well-nourished, pleasant and cooperative in NAD Neck:  Supple; no masses or thyromegaly. No significant cervical adenopathy. Lungs:  Clear throughout to auscultation.   No wheezes, crackles, or rhonchi. No acute distress. Heart:  Regular rate and rhythm; no murmurs, clicks, rubs,  or gallops. Abdomen: Non-distended, normal bowel sounds.  Soft and nontender without appreciable mass or hepatosplenomegaly.  Pulses:  Normal pulses noted. Extremities:  Without clubbing or edema.  Impression/Plan: 56 year old lady here for further evaluation of chronic diarrhea via colonoscopy.  Have offered  the patient a diagnostic colonoscopy today. The risks, benefits, limitations, alternatives and imponderables have been reviewed with the patient. Questions have been answered. All parties are agreeable.       Notice: This dictation was prepared with Dragon dictation along with smaller phrase technology. Any transcriptional errors that result from this process are unintentional and may not be corrected upon review.

## 2022-09-04 NOTE — Op Note (Signed)
[facility] Patient Name: Sydney Taylor Procedure Date: 09/04/2022 8:02 AM MRN: [id number] Date of Birth: August 13, 1966 Attending MD: Norvel Richards , MD CSN: [id number] Age: 56 Admit Type: Outpatient Procedure:                Colonoscopy Indications:              Chronic diarrhea Providers:                Norvel Richards, MD, Crystal Page, Caprice Kluver,                            Everardo Pacific Referring MD:              Medicines:                Propofol per Anesthesia Complications:            No immediate complications. Estimated Blood Loss:     Estimated blood loss was minimal. Procedure:                Pre-Anesthesia Assessment:                           - Prior to the procedure, a History and Physical                            was performed, and patient medications and                            allergies were reviewed. The patient's tolerance of                            previous anesthesia was also reviewed. The risks                            and benefits of the procedure and the sedation                            options and risks were discussed with the patient.                            All questions were answered, and informed consent                            was obtained. Prior Anticoagulants: The patient has                            taken no previous anticoagulant or antiplatelet                            agents. ASA Grade Assessment: III - A patient with                            severe systemic disease. After reviewing the risks  and benefits, the patient was deemed in                            satisfactory condition to undergo the procedure.                           After obtaining informed consent, the colonoscope                            was passed under direct vision. Throughout the                            procedure, the patient's blood pressure, pulse, and                            oxygen saturations  were monitored continuously. The                            [id number] scope was introduced through the                            anus and advanced to the the cecum, identified by                            appendiceal orifice and ileocecal valve. The                            colonoscopy was performed without difficulty. The                            patient tolerated the procedure well. The quality                            of the bowel preparation was adequate. Scope In: 8:25:59 AM Scope Out: 8:46:51 AM Scope Withdrawal Time: 0 hours 11 minutes 4 seconds  Total Procedure Duration: 0 hours 20 minutes 52 seconds  Findings:      The perianal and digital rectal examinations were normal. Redundant       colon. External abdominal pressure required to reach the cecum. Due to       angulation, I was unable to intubate the terminal ileum.      A 5 mm polyp was found in the ascending colon. The polyp was       semi-pedunculated. The polyp was removed with a cold snare. Resection       and retrieval were complete. Estimated blood loss was minimal. Segmental       biopsies of the right and left colon taken for histologic study.      The exam was otherwise without abnormality on direct and retroflexion       views. Impression:               - One 5 mm polyp in the ascending colon, removed                            with a cold snare. Resected and retrieved. Status  post segmental biopsy.                           - The examination was otherwise normal on direct                            and retroflexion views. Redundant colon. Moderate Sedation:      Moderate (conscious) sedation was personally administered by an       anesthesia professional. The following parameters were monitored: oxygen       saturation, heart rate, blood pressure, respiratory rate, EKG, adequacy       of pulmonary ventilation, and response to care. Recommendation:           - Patient  has a contact number available for                            emergencies. The signs and symptoms of potential                            delayed complications were discussed with the                            patient. Return to normal activities tomorrow.                            Written discharge instructions were provided to the                            patient.                           - Resume previous diet.                           - Continue present medications.                           - Repeat colonoscopy date to be determined after                            pending pathology results are reviewed for                            surveillance.                           - Return to GI office in 6 weeks. Procedure Code(s):        --- Professional ---                           [phone], Colonoscopy, flexible; with removal of                            tumor(s), polyp(s), or other lesion(s) by snare  technique Diagnosis Code(s):        --- Professional ---                           K63.5, Polyp of colon                           K52.9, Noninfective gastroenteritis and colitis,                            unspecified CPT copyright 2019 American Medical Association. All rights reserved. The codes documented in this report are preliminary and upon coder review may  be revised to meet current compliance requirements. Cristopher Estimable. Choice Kleinsasser, MD Norvel Richards, MD 09/04/2022 8:51:55 AM This report has been signed electronically. Number of Addenda: 0

## 2022-09-05 ENCOUNTER — Encounter: Payer: Self-pay | Admitting: Internal Medicine

## 2022-09-05 LAB — SURGICAL PATHOLOGY

## 2022-09-06 NOTE — Progress Notes (Deleted)
Office Visit Note  Patient: Sydney Taylor             Date of Birth: [birth year]           MRN: [id number]             PCP: Lindell Spar, MD Referring: Lindell Spar, MD Visit Date: 09/17/2022   Subjective:  No chief complaint on file.   History of Present Illness: Sydney Taylor is a 56 y.o. female here for follow up for her chronic fatigue and fibromyalgia symptoms with elevated inflammatory markers   Previous HPI 04/10/2022 Sydney Taylor is a 56 y.o. female here for follow up for her chronic fatigue and fibromyalgia symptoms with elevated inflammatory markers. At last visit she did not have any evidence of active inflammatory disease but we are following up to monitor because of abnormal labs. She was referred to physical therapy for her FMS symptoms also for bilateral knee osteoarthritis but was not able to keep this original appointment. Since last visit she feels her muscle pain is overall worse. Her legs feel weak and not very stable a lot of the time. No falls reported.   11/2020 ESR wnl CRP 11.7   08/2020 ANA neg RF neg   No Rheumatology ROS completed.   PMFS History:  Patient Active Problem List   Diagnosis Date Noted   Abdominal pain, epigastric 07/30/2022   Diarrhea 07/30/2022   Rash and nonspecific skin eruption 06/07/2022   Essential hypertension 06/03/2022   Fatty liver 03/05/2022   Gallstone pancreatitis 03/04/2022   Acute pancreatitis 02/12/2022   Encounter for general adult medical examination with abnormal findings 02/01/2022   Plantar fasciitis 02/01/2022   Chest pain 01/14/2022   Chronic migraine without aura without status migrainosus, not intractable 10/29/2021   Diabetic polyneuropathy associated with type 2 diabetes mellitus ([place]) 09/19/2021   History of hormone therapy 09/19/2021   Bilateral primary osteoarthritis of knee 04/10/2021   Perennial allergic rhinitis 04/06/2021   Allergic contact dermatitis 04/06/2021   Cholinergic  urticaria 04/06/2021   Fibromyalgia syndrome 01/12/2021   Elevated C-reactive protein (CRP) 01/12/2021   Diverticulosis of colon without hemorrhage 01/02/2021   Major depressive disorder, single episode, severe ([place]) 07/14/2020   GERD (gastroesophageal reflux disease) 05/11/2020   Type 2 diabetes mellitus ([place]) 03/27/2020   Bipolar depression ([place]) 12/28/2019   Sleep apnea 12/28/2019   Tachycardia 10/31/2019   Hyperlipidemia 10/30/2015   Morbid obesity ([place]) 10/30/2015    Past Medical History:  Diagnosis Date   Anemia    Anxiety    Arrhythmia    Bipolar depression ([place]) 12/28/2019   Chronic fatigue    Chronic headaches    Depression    Diabetes mellitus without complication (HCC)    Fibromyalgia    GERD (gastroesophageal reflux disease)    High cholesterol    HLD (hyperlipidemia)    Hypertension    Insect bite    Migraine    Prediabetes    Sleep apnea    Urinary incontinence     Family History  Problem Relation Age of Onset   Hypertension Father    Hyperlipidemia Father    Diabetes Father    Stroke Father    Depression Father    Anxiety disorder Father    Hypertension Mother    Hyperlipidemia Mother    Diabetes Mellitus II Mother    Congestive Heart Failure Mother    Heart attack Mother    Hypertension Sister    Diabetes  Sister    Hypertension Brother    Autoimmune disease Brother    Diabetes Maternal Grandmother    Lupus Paternal Grandmother    Depression Paternal Grandmother    Anxiety disorder Paternal Grandmother    Depression Maternal Aunt    Anxiety disorder Maternal Aunt    Colon cancer Neg Hx    Esophageal cancer Neg Hx    Gastric cancer Neg Hx    Past Surgical History:  Procedure Laterality Date   CHOLECYSTECTOMY N/A 03/15/2022   Procedure: LAPAROSCOPIC CHOLECYSTECTOMY WITH INTRAOPERATIVE CHOLANGIOGRAM;  Surgeon: Virl Cagey, MD;  Location: [facility];  Service: General;  Laterality: N/A;   COLONOSCOPY  2017   per patient normal and needs  next one in 2027 (Dr. Posey Pronto)   ESOPHAGOGASTRODUODENOSCOPY N/A 05/12/2020   Procedure: ESOPHAGOGASTRODUODENOSCOPY (EGD);  Surgeon: Daneil Dolin, MD;  Location: [facility];  Service: Endoscopy;  Laterality: N/A;  11:15am   Joint fusion on foot Right    LIVER BIOPSY N/A 03/15/2022   Procedure: LIVER BIOPSY;  Surgeon: Virl Cagey, MD;  Location: [facility];  Service: General;  Laterality: N/A;   MALONEY DILATION N/A 05/12/2020   Procedure: Keturah Shavers;  Surgeon: Daneil Dolin, MD;  Location: [facility];  Service: Endoscopy;  Laterality: N/A;   NASAL SEPTUM SURGERY     SINOSCOPY     Social History   Social History Narrative   Married for 3 years.Lives with husband.Bachelors in Education officer, museum.   Right handed   Caffeine: 1-2 per day    Immunization History  Administered Date(s) Administered   Influenza Whole 10/30/2021   Influenza,inj,Quad PF,6+ Mos 11/01/2019   Influenza-Unspecified 10/30/2020   Moderna Covid-19 Vaccine Bivalent Booster 29yrs & up 05/30/2022   Moderna Sars-Covid-2 Vaccination 05/13/2020, 06/23/2020, 07/12/2021   Pneumococcal Polysaccharide-23 12/18/2020   Td 12/30/2020   Zoster Recombinat (Shingrix) 12/30/2020, 05/29/2021     Objective: Vital Signs: LMP  (LMP Unknown)    Physical Exam   Musculoskeletal Exam: ***  CDAI Exam: CDAI Score: -- Patient Global: --; Provider Global: -- Swollen: --; Tender: -- Joint Exam 09/17/2022   No joint exam has been documented for this visit   There is currently no information documented on the homunculus. Go to the Rheumatology activity and complete the homunculus joint exam.  Investigation: No additional findings.  Imaging: No results found.  Recent Labs: Lab Results  Component Value Date   WBC 9.3 07/30/2022   HGB 12.6 07/30/2022   PLT 358 07/30/2022   NA 141 07/30/2022   K 4.2 07/30/2022   CL 100 07/30/2022   CO2 25 07/30/2022   GLUCOSE 116 (H) 07/30/2022   BUN 14 07/30/2022   CREATININE 1.01  (H) 07/30/2022   BILITOT 0.3 07/30/2022   ALKPHOS 97 07/30/2022   AST 16 07/30/2022   ALT 20 07/30/2022   PROT 6.4 07/30/2022   ALBUMIN 4.3 07/30/2022   CALCIUM 9.4 07/30/2022   GFRAA 89 03/19/2021    Speciality Comments: No specialty comments available.  Procedures:  No procedures performed Allergies: Cephalexin   Assessment / Plan:     Visit Diagnoses: No diagnosis found.  ***  Orders: No orders of the defined types were placed in this encounter.  No orders of the defined types were placed in this encounter.    Follow-Up Instructions: No follow-ups on file.   Bertram Savin, RT  Note - This record has been created using [employer].  Chart creation errors have been sought, but may not  always  have been located. Such creation errors do not reflect on  the standard of medical care.

## 2022-09-11 ENCOUNTER — Encounter (HOSPITAL_COMMUNITY): Payer: Self-pay | Admitting: Internal Medicine

## 2022-09-17 ENCOUNTER — Ambulatory Visit: Payer: Medicare Other | Admitting: Internal Medicine

## 2022-09-17 DIAGNOSIS — R7982 Elevated C-reactive protein (CRP): Secondary | ICD-10-CM

## 2022-09-17 DIAGNOSIS — M797 Fibromyalgia: Secondary | ICD-10-CM

## 2022-09-17 DIAGNOSIS — M170 Bilateral primary osteoarthritis of knee: Secondary | ICD-10-CM

## 2022-09-17 DIAGNOSIS — Z0000 Encounter for general adult medical examination without abnormal findings: Secondary | ICD-10-CM

## 2022-09-19 NOTE — Progress Notes (Signed)
Office Visit Note  Patient: Sydney Taylor             Date of Birth: 22-Apr-1966           MRN: [id number]             PCP: Lindell Spar, MD Referring: Lindell Spar, MD Visit Date: 09/23/2022   Subjective:  Follow-up (Rashes on legs and feet. Vasculitis dx from dermatologist. )   History of Present Illness: Sydney Taylor is a 56 y.o. female here for follow up with previous fibromyalgia syndrome and mildly elevated CRP but here today for evaluation of new rashes affecting both legs. She is having intermittent erythematous, flat spots occurring from upper leg down to both feet. Individual areas are discolored for about 2-3 days at a time before resolving. No bruising or skin color changes are left behind. No pain or itching sensation at the affected areas. No wheals or raised skin. She does have mild lower extremity swelling normally, and this may be mildly increased during this time. She saw dermatology who attribute this too cutaneous vasculitis changes but no biopsy obtained and no new treatment started, just observation. She is concerned about potential systemic involvement. She also feels her diffuse pains and stiffness are gradually worse, and sees herself moving more "like an old person" over time.  Previous HPI 04/10/2022 Sydney Taylor is a 56 y.o. female here for follow up for her chronic fatigue and fibromyalgia symptoms with elevated inflammatory markers. At last visit she did not have any evidence of active inflammatory disease but we are following up to monitor because of abnormal labs. She was referred to physical therapy for her FMS symptoms also for bilateral knee osteoarthritis but was not able to keep this original appointment. Since last visit she feels her muscle pain is overall worse. Her legs feel weak and not very stable a lot of the time. No falls reported.   11/2020 ESR wnl CRP 11.7   08/2020 ANA neg RF neg     Review of Systems  Constitutional:   Positive for fatigue.  HENT:  Positive for mouth sores and mouth dryness.   Eyes:  Positive for dryness.  Respiratory:  Negative for shortness of breath.   Cardiovascular:  Negative for chest pain and palpitations.  Gastrointestinal:  Negative for blood in stool, constipation and diarrhea.  Endocrine: Negative for increased urination.  Genitourinary:  Positive for involuntary urination.  Musculoskeletal:  Positive for joint pain, joint pain, myalgias, muscle weakness, morning stiffness, muscle tenderness and myalgias. Negative for gait problem and joint swelling.  Skin:  Positive for color change, rash, hair loss and sensitivity to sunlight.  Allergic/Immunologic: Negative for susceptible to infections.  Neurological:  Negative for dizziness and headaches.  Hematological:  Negative for swollen glands.  Psychiatric/Behavioral:  Positive for depressed mood. Negative for sleep disturbance. The patient is nervous/anxious.     PMFS History:  Patient Active Problem List   Diagnosis Date Noted   Cutaneous vasculitis 09/23/2022   Abdominal pain, epigastric 07/30/2022   Diarrhea 07/30/2022   Rash and nonspecific skin eruption 06/07/2022   Essential hypertension 06/03/2022   Fatty liver 03/05/2022   Gallstone pancreatitis 03/04/2022   Acute pancreatitis 02/12/2022   Encounter for general adult medical examination with abnormal findings 02/01/2022   Plantar fasciitis 02/01/2022   Chest pain 01/14/2022   Chronic migraine with aura 10/29/2021   Diabetic polyneuropathy associated with type 2 diabetes mellitus ([place]) 09/19/2021   History of  hormone therapy 09/19/2021   Bilateral primary osteoarthritis of knee 04/10/2021   Perennial allergic rhinitis 04/06/2021   Allergic contact dermatitis 04/06/2021   Cholinergic urticaria 04/06/2021   Fibromyalgia syndrome 01/12/2021   Elevated C-reactive protein (CRP) 01/12/2021   Diverticulosis of colon without hemorrhage 01/02/2021   Major depressive  disorder, single episode, severe ([place]) 07/14/2020   GERD (gastroesophageal reflux disease) 05/11/2020   Type 2 diabetes mellitus ([place]) 03/27/2020   Bipolar depression ([place]) 12/28/2019   Sleep apnea 12/28/2019   Tachycardia 10/31/2019   Hyperlipidemia 10/30/2015   Morbid obesity ([place]) 10/30/2015    Past Medical History:  Diagnosis Date   Anemia    Anxiety    Arrhythmia    Bipolar depression ([place]) 12/28/2019   Chronic fatigue    Chronic headaches    Depression    Diabetes mellitus without complication (HCC)    Fibromyalgia    GERD (gastroesophageal reflux disease)    High cholesterol    HLD (hyperlipidemia)    Hypertension    Insect bite    Migraine    Prediabetes    Sleep apnea    Urinary incontinence    Vasculitis ([place])     Family History  Problem Relation Age of Onset   Hypertension Father    Hyperlipidemia Father    Diabetes Father    Stroke Father    Depression Father    Anxiety disorder Father    Hypertension Mother    Hyperlipidemia Mother    Diabetes Mellitus II Mother    Congestive Heart Failure Mother    Heart attack Mother    Hypertension Sister    Diabetes Sister    Hypertension Brother    Autoimmune disease Brother    Diabetes Maternal Grandmother    Lupus Paternal Grandmother    Depression Paternal Grandmother    Anxiety disorder Paternal Grandmother    Depression Maternal Aunt    Anxiety disorder Maternal Aunt    Colon cancer Neg Hx    Esophageal cancer Neg Hx    Gastric cancer Neg Hx    Past Surgical History:  Procedure Laterality Date   BIOPSY  09/04/2022   Procedure: BIOPSY;  Surgeon: Daneil Dolin, MD;  [facility];  Service: Endoscopy;;   CHOLECYSTECTOMY N/A 03/15/2022   Procedure: LAPAROSCOPIC CHOLECYSTECTOMY WITH INTRAOPERATIVE CHOLANGIOGRAM;  Surgeon: Virl Cagey, MD;  Location: [facility];  Service: General;  Laterality: N/A;   CHOLECYSTECTOMY  02/03/2022   COLONOSCOPY  2017   per patient normal and needs next  one in 2027 (Dr. Posey Pronto)   COLONOSCOPY WITH PROPOFOL N/A 09/04/2022   Procedure: COLONOSCOPY WITH PROPOFOL;  Surgeon: Daneil Dolin, MD;  [facility];  Service: Endoscopy;  Laterality: N/A;   ESOPHAGOGASTRODUODENOSCOPY N/A 05/12/2020   Procedure: ESOPHAGOGASTRODUODENOSCOPY (EGD);  Surgeon: Daneil Dolin, MD;  [facility];  Service: Endoscopy;  Laterality: N/A;  11:15am   Joint fusion on foot Right    LIVER BIOPSY N/A 03/15/2022   Procedure: LIVER BIOPSY;  Surgeon: Virl Cagey, MD;  Location: [facility];  Service: General;  Laterality: N/A;   MALONEY DILATION N/A 05/12/2020   Procedure: Keturah Shavers;  Surgeon: Daneil Dolin, MD;  [facility];  Service: Endoscopy;  Laterality: N/A;   NASAL SEPTUM SURGERY     POLYPECTOMY  09/04/2022   Procedure: POLYPECTOMY;  Surgeon: Daneil Dolin, MD;  [facility];  Service: Endoscopy;;   SINOSCOPY     Social History  Social History Narrative   Married for 3 years.Lives with husband.Bachelors in Education officer, museum.   Right handed   Caffeine: 1-2 per day    Immunization History  Administered Date(s) Administered   Influenza Whole 10/30/2021   Influenza,inj,Quad PF,6+ Mos 11/01/2019   Influenza-Unspecified 10/30/2020   Moderna Covid-19 Vaccine Bivalent Booster 78yr & up 05/30/2022   Moderna Sars-Covid-2 Vaccination 05/13/2020, 06/23/2020, 07/12/2021   Pneumococcal Polysaccharide-23 12/18/2020   Td 12/30/2020   Zoster Recombinat (Shingrix) 12/30/2020, 05/29/2021     Objective: Vital Signs: BP 120/82 (BP Location: Left Arm, Patient Position: Sitting, Cuff Size: Large)   Pulse 88   Resp 15   Ht 5' 9"  (1.753 m)   Wt 270 lb 3.2 oz (122.6 kg)   LMP  (LMP Unknown)   BMI 39.90 kg/m    Physical Exam Constitutional:      Appearance: She is obese.  HENT:     Mouth/Throat:     Mouth: Mucous membranes are moist.     Pharynx: Oropharynx is clear. No posterior oropharyngeal erythema.  Eyes:      Conjunctiva/sclera: Conjunctivae normal.  Cardiovascular:     Rate and Rhythm: Normal rate and regular rhythm.  Pulmonary:     Effort: Pulmonary effort is normal.     Breath sounds: Normal breath sounds.  Musculoskeletal:     Right lower leg: Edema (trace) present.     Left lower leg: Edema (trace) present.  Lymphadenopathy:     Cervical: No cervical adenopathy.  Skin:    General: Skin is warm and dry.  Neurological:     Mental Status: She is alert.     Motor: No weakness.  Psychiatric:        Mood and Affect: Mood normal.      Musculoskeletal Exam:  Shoulders full ROM no swelling Elbows full ROM no swelling Wrists full ROM no swelling Fingers full ROM no swelling Knees full ROM joint line tenderness to pressure, worse on medial side in right knee, no swelling Ankles full ROM no tenderness or swelling  Investigation: No additional findings.  Imaging: No results found.  Recent Labs: Lab Results  Component Value Date   WBC 9.3 07/30/2022   HGB 12.6 07/30/2022   PLT 358 07/30/2022   NA 141 07/30/2022   K 4.2 07/30/2022   CL 100 07/30/2022   CO2 25 07/30/2022   GLUCOSE 116 (H) 07/30/2022   BUN 14 07/30/2022   CREATININE 1.01 (H) 07/30/2022   BILITOT 0.3 07/30/2022   ALKPHOS 97 07/30/2022   AST 16 07/30/2022   ALT 20 07/30/2022   PROT 6.4 07/30/2022   ALBUMIN 4.3 07/30/2022   CALCIUM 9.4 07/30/2022   GFRAA 89 03/19/2021    Speciality Comments: No specialty comments available.  Procedures:  No procedures performed Allergies: Cephalexin   Assessment / Plan:     Visit Diagnoses: Rash and nonspecific skin eruption Cutaneous vasculitis - Plan: Sedimentation rate, Serum protein electrophoresis with reflex, Cryoglobulin, Anti-DNA antibody, double-stranded, C3 and C4, ANCA Screen Reflex Titer([facility])  Vasculitis by visual diagnosis, also has rosacea that is more chronic. I see some residual patches but no very active spots on exam today. Involvement of upper legs  making stasis dermatitis or venous insufficiency unlikely to explain. Does not have pain or residual bruising to suggest urticarial vasculitis. Will check labs screening for multiple problems for small vessel vasculitis, also rechecking sedimentation rate which has been normal previously.  Fibromyalgia syndrome  Increase in generalized stiffness and pain gradually over time. If  no specific underlying inflammatory condition identified would recommend PM&R for additional management recommendations.  Bilateral primary osteoarthritis of knee  Joint line tenderness to pressure, appears stable about the same as on exam last year.   Orders: Orders Placed This Encounter  Procedures   Sedimentation rate   Serum protein electrophoresis with reflex   Cryoglobulin   Anti-DNA antibody, double-stranded   C3 and C4   ANCA Screen Reflex Titer([facility])   No orders of the defined types were placed in this encounter.    Follow-Up Instructions: No follow-ups on file.   Collier Salina, MD  Note - This record has been created using [employer].  Chart creation errors have been sought, but may not always  have been located. Such creation errors do not reflect on  the standard of medical care.

## 2022-09-23 ENCOUNTER — Ambulatory Visit: Payer: Medicare Other | Attending: Internal Medicine | Admitting: Internal Medicine

## 2022-09-23 ENCOUNTER — Encounter: Payer: Self-pay | Admitting: Internal Medicine

## 2022-09-23 VITALS — BP 120/82 | HR 88 | Resp 15 | Ht 69.0 in | Wt 270.2 lb

## 2022-09-23 DIAGNOSIS — M170 Bilateral primary osteoarthritis of knee: Secondary | ICD-10-CM

## 2022-09-23 DIAGNOSIS — L959 Vasculitis limited to the skin, unspecified: Secondary | ICD-10-CM | POA: Diagnosis not present

## 2022-09-23 DIAGNOSIS — R7982 Elevated C-reactive protein (CRP): Secondary | ICD-10-CM

## 2022-09-23 DIAGNOSIS — R21 Rash and other nonspecific skin eruption: Secondary | ICD-10-CM | POA: Diagnosis not present

## 2022-09-23 DIAGNOSIS — M797 Fibromyalgia: Secondary | ICD-10-CM

## 2022-09-23 DIAGNOSIS — Z0000 Encounter for general adult medical examination without abnormal findings: Secondary | ICD-10-CM

## 2022-09-25 ENCOUNTER — Encounter: Payer: Self-pay | Admitting: Internal Medicine

## 2022-09-25 ENCOUNTER — Ambulatory Visit (INDEPENDENT_AMBULATORY_CARE_PROVIDER_SITE_OTHER): Payer: Medicare Other | Admitting: Internal Medicine

## 2022-09-25 DIAGNOSIS — J069 Acute upper respiratory infection, unspecified: Secondary | ICD-10-CM | POA: Diagnosis not present

## 2022-09-25 NOTE — Patient Instructions (Signed)
Please come to office for flu and COVID test.  Please take Robitussin or Mucinex as needed for cough.

## 2022-09-25 NOTE — Progress Notes (Unsigned)
Virtual Visit via Telephone Note   This visit type was conducted due to national recommendations for restrictions regarding the COVID-19 Pandemic (e.g. social distancing) in an effort to limit this patient's exposure and mitigate transmission in our community.  Due to her co-morbid illnesses, this patient is at least at moderate risk for complications without adequate follow up.  This format is felt to be most appropriate for this patient at this time.  The patient did not have access to video technology/had technical difficulties with video requiring transitioning to audio format only (telephone).  All issues noted in this document were discussed and addressed.  No physical exam could be performed with this format.  Evaluation Performed:  Follow-up visit  Date:  09/25/2022   ID:  Sydney Taylor, DOB [birth year], MRN [id number]  Patient Location: Home Provider Location: Office/Clinic  Participants: Patient Location of Patient: Home Location of Provider: Telehealth Consent was obtain for visit to be over via telehealth. I verified that I am speaking with the correct person using two identifiers.  PCP:  Lindell Spar, MD   Chief Complaint: Cough, nasal congestion, sore throat, fever and dyspnea  History of Present Illness:    Sydney Taylor is a 56 y.o. female who has a televisit for c/o cough, nasal congestion, sore throat, fever and dyspnea for the last 2 days.  Her home COVID test was negative.  She has history of allergic rhinitis and recurrent sinusitis.  She has had a ENT evaluation and has been advised to use Flonase and antibiotic nasal rinse for it.  The patient does have symptoms concerning for COVID-19 infection (fever, chills, cough, or new shortness of breath).   Past Medical, Surgical, Social History, Allergies, and Medications have been Reviewed.  Past Medical History:  Diagnosis Date   Anemia    Anxiety    Arrhythmia    Bipolar depression ([place]) 12/28/2019    Chronic fatigue    Chronic headaches    Depression    Diabetes mellitus without complication (HCC)    Fibromyalgia    GERD (gastroesophageal reflux disease)    High cholesterol    HLD (hyperlipidemia)    Hypertension    Insect bite    Migraine    Prediabetes    Sleep apnea    Urinary incontinence    Vasculitis ([place])    Past Surgical History:  Procedure Laterality Date   BIOPSY  09/04/2022   Procedure: BIOPSY;  Surgeon: Daneil Dolin, MD;  [facility];  Service: Endoscopy;;   CHOLECYSTECTOMY N/A 03/15/2022   Procedure: LAPAROSCOPIC CHOLECYSTECTOMY WITH INTRAOPERATIVE CHOLANGIOGRAM;  Surgeon: Virl Cagey, MD;  Location: [facility];  Service: General;  Laterality: N/A;   CHOLECYSTECTOMY  02/03/2022   COLONOSCOPY  2017   per patient normal and needs next one in 2027 (Dr. Posey Pronto)   COLONOSCOPY WITH PROPOFOL N/A 09/04/2022   Procedure: COLONOSCOPY WITH PROPOFOL;  Surgeon: Daneil Dolin, MD;  [facility];  Service: Endoscopy;  Laterality: N/A;   ESOPHAGOGASTRODUODENOSCOPY N/A 05/12/2020   Procedure: ESOPHAGOGASTRODUODENOSCOPY (EGD);  Surgeon: Daneil Dolin, MD;  [facility];  Service: Endoscopy;  Laterality: N/A;  11:15am   Joint fusion on foot Right    LIVER BIOPSY N/A 03/15/2022   Procedure: LIVER BIOPSY;  Surgeon: Virl Cagey, MD;  Location: [facility];  Service: General;  Laterality: N/A;   MALONEY DILATION N/A 05/12/2020   Procedure: Venia Minks DILATION;  Surgeon: Daneil Dolin, MD;  [facility];  Service: Endoscopy;  Laterality: N/A;   NASAL SEPTUM SURGERY     POLYPECTOMY  09/04/2022   Procedure: POLYPECTOMY;  Surgeon: Daneil Dolin, MD;  [facility];  Service: Endoscopy;;   SINOSCOPY       Current Meds  Medication Sig   ALPRAZolam (XANAX) 0.5 MG tablet Take 0.25 mg by mouth daily as needed for anxiety.   Aspirin-Caffeine (BC FAST PAIN RELIEF PO) Take 1 packet by mouth daily as needed (pain).   B-12  Methylcobalamin 1000 MCG TBDP Take 1,000 mcg by mouth daily.   blood glucose meter kit and supplies KIT Inject 1 each into the skin daily. Check blood glucose once a day. Diagnosis code: E 11.9.   calcium carbonate (TUMS - DOSED IN MG ELEMENTAL CALCIUM) 500 MG chewable tablet Chew 1,000 mg by mouth daily as needed for indigestion or heartburn.   chlorthalidone (HYGROTON) 25 MG tablet Take 0.5 tablets (12.5 mg total) by mouth daily.   colestipol (COLESTID) 1 g tablet Take 2 tablets (2 g total) by mouth daily. Do not take within two hours of other medications.   Continuous Blood Gluc Sensor (DEXCOM G6 SENSOR) MISC by Does not apply route.   Continuous Blood Gluc Transmit (DEXCOM G6 TRANSMITTER) MISC by Does not apply route.   dicyclomine (BENTYL) 10 MG capsule Take 1 capsule (10 mg total) by mouth 4 (four) times daily -  before meals and at bedtime. For abdominal cramping and loose stool (Patient taking differently: Take 10 mg by mouth daily. For abdominal cramping and loose stool)   DULoxetine (CYMBALTA) 60 MG capsule Take 60 mg by mouth every morning.   empagliflozin (JARDIANCE) 25 MG TABS tablet Take 1 tablet (25 mg total) by mouth daily before breakfast.   Exenatide ER (BYDUREON BCISE) 2 MG/0.85ML AUIJ Inject weekly   glucose blood (ACCU-CHEK GUIDE) test strip 1 each by Other route in the morning, at noon, and at bedtime. Use as instructed   ibuprofen (ADVIL) 200 MG tablet Take 600 mg by mouth every 6 (six) hours as needed for moderate pain or headache.   insulin glargine, 2 Unit Dial, (TOUJEO MAX SOLOSTAR) 300 UNIT/ML Solostar Pen Inject 170 Units into the skin every morning. And pen needles 1/day (Patient taking differently: Inject 150 Units into the skin every morning. And pen needles 1/day)   Insulin Pen Needle (PEN NEEDLES) 32G X 4 MM MISC Check BS 2 x a day   KLOR-CON M20 20 MEQ tablet TAKE 2 TABLETS BY MOUTH TWICE A DAY (Patient taking differently: Take 40 mEq by mouth daily.)    L-Methylfolate 15 MG TABS Take 15 mg by mouth daily at 6 (six) AM.   losartan (COZAAR) 25 MG tablet Take 1 tablet by mouth once daily   metFORMIN (GLUCOPHAGE-XR) 500 MG 24 hr tablet Take 2 tablets (1,000 mg total) by mouth daily with breakfast. (Patient taking differently: Take 1,000 mg by mouth daily with supper.)   Multiple Vitamins-Minerals (MULTIVITAMIN WOMEN 50+) TABS Take 1 tablet by mouth daily.   Na Sulfate-K Sulfate-Mg Sulf 17.5-3.13-1.6 GM/177ML SOLN As directed   ondansetron (ZOFRAN) 4 MG tablet Take 1 tablet (4 mg total) by mouth every 8 (eight) hours as needed for nausea or vomiting.   pantoprazole (PROTONIX) 40 MG tablet Take 1 tablet by mouth once daily   rosuvastatin (CRESTOR) 10 MG tablet Take 1 tablet (10 mg total) by mouth daily. (Patient taking differently: Take 10 mg by mouth every other day.)  traZODone (DESYREL) 50 MG tablet Take 12.5 mg by mouth at bedtime.     Allergies:   Cephalexin   ROS:   Please see the history of present illness.     All other systems reviewed and are negative.   Labs/Other Tests and Data Reviewed:    Recent Labs: 03/29/2022: TSH 1.56 07/30/2022: ALT 20; BUN 14; Creatinine, Ser 1.01; Hemoglobin 12.6; Platelets 358; Potassium 4.2; Sodium 141   Recent Lipid Panel Lab Results  Component Value Date/Time   CHOL 136 12/14/2021 10:12 AM   TRIG 189 (H) 12/14/2021 10:12 AM   HDL 36 (L) 12/14/2021 10:12 AM   CHOLHDL 3.8 12/14/2021 10:12 AM   CHOLHDL 5.2 (H) 01/21/2020 10:09 AM   LDLCALC 68 12/14/2021 10:12 AM   LDLCALC 133 (H) 01/21/2020 10:09 AM    Wt Readings from Last 3 Encounters:  09/23/22 270 lb 3.2 oz (122.6 kg)  08/20/22 274 lb (124.3 kg)  07/30/22 273 lb 12.8 oz (124.2 kg)     ASSESSMENT & PLAN:    URTI Advised to get flu and COVID RT-PCR test at our office or local pharmacy Continue Flonase and antibiotic rinse as advised by ENT If persistent symptoms despite symptomatic treatment and negative viral testing, will start  abx  Time:   Today, I have spent 9 minutes reviewing the chart, including problem list, medications, and with the patient with telehealth technology discussing the above problems.   Medication Adjustments/Labs and Tests Ordered: Current medicines are reviewed at length with the patient today.  Concerns regarding medicines are outlined above.   Tests Ordered: No orders of the defined types were placed in this encounter.   Medication Changes: No orders of the defined types were placed in this encounter.    Note: This dictation was prepared with Dragon dictation along with smaller phrase technology. Similar sounding words can be transcribed inadequately or may not be corrected upon review. Any transcriptional errors that result from this process are unintentional.      Disposition:  Follow up  Signed, Lindell Spar, MD  09/25/2022 4:27 PM     [place]

## 2022-09-30 ENCOUNTER — Encounter: Payer: Self-pay | Admitting: Internal Medicine

## 2022-09-30 DIAGNOSIS — L959 Vasculitis limited to the skin, unspecified: Secondary | ICD-10-CM

## 2022-09-30 DIAGNOSIS — M7989 Other specified soft tissue disorders: Secondary | ICD-10-CM

## 2022-09-30 DIAGNOSIS — M797 Fibromyalgia: Secondary | ICD-10-CM

## 2022-09-30 LAB — PROTEIN ELECTROPHORESIS, SERUM, WITH REFLEX
Albumin ELP: 4 g/dL (ref 3.8–4.8)
Alpha 1: 0.3 g/dL (ref 0.2–0.3)
Alpha 2: 0.8 g/dL (ref 0.5–0.9)
Beta 2: 0.4 g/dL (ref 0.2–0.5)
Beta Globulin: 0.5 g/dL (ref 0.4–0.6)
Gamma Globulin: 0.6 g/dL — ABNORMAL LOW (ref 0.8–1.7)
Total Protein: 6.7 g/dL (ref 6.1–8.1)

## 2022-09-30 LAB — C3 AND C4
C3 Complement: 209 mg/dL — ABNORMAL HIGH (ref 83–193)
C4 Complement: 47 mg/dL (ref 15–57)

## 2022-09-30 LAB — ANTI-DNA ANTIBODY, DOUBLE-STRANDED: ds DNA Ab: <1 IU/mL

## 2022-09-30 LAB — ANCA SCREEN W REFLEX TITER: ANCA SCREEN: NEGATIVE

## 2022-09-30 LAB — SEDIMENTATION RATE: Sed Rate: 11 mm/h (ref 0–30)

## 2022-09-30 LAB — IFE INTERPRETATION

## 2022-09-30 LAB — CRYOGLOBULIN: Cryoglobulin, Qualitative Analysis: NOT DETECTED

## 2022-10-01 ENCOUNTER — Other Ambulatory Visit: Payer: Self-pay | Admitting: Internal Medicine

## 2022-10-01 DIAGNOSIS — J069 Acute upper respiratory infection, unspecified: Secondary | ICD-10-CM

## 2022-10-01 MED ORDER — AZITHROMYCIN 250 MG PO TABS: ORAL_TABLET | ORAL | 0 refills | Status: AC

## 2022-10-04 ENCOUNTER — Telehealth: Payer: Self-pay

## 2022-10-04 NOTE — Telephone Encounter (Signed)
FYI patient called inquiring about lab results from labs drawn on 09/23/2022.

## 2022-10-07 ENCOUNTER — Encounter: Payer: Self-pay | Admitting: Internal Medicine

## 2022-10-07 ENCOUNTER — Ambulatory Visit (INDEPENDENT_AMBULATORY_CARE_PROVIDER_SITE_OTHER): Payer: Medicare Other | Admitting: Internal Medicine

## 2022-10-07 DIAGNOSIS — E1142 Type 2 diabetes mellitus with diabetic polyneuropathy: Secondary | ICD-10-CM

## 2022-10-07 DIAGNOSIS — I10 Essential (primary) hypertension: Secondary | ICD-10-CM | POA: Diagnosis not present

## 2022-10-07 DIAGNOSIS — J3089 Other allergic rhinitis: Secondary | ICD-10-CM

## 2022-10-07 DIAGNOSIS — R5382 Chronic fatigue, unspecified: Secondary | ICD-10-CM

## 2022-10-07 DIAGNOSIS — Z794 Long term (current) use of insulin: Secondary | ICD-10-CM

## 2022-10-07 MED ORDER — DESLORATADINE 5 MG PO TABS: 5.0000 mg | ORAL_TABLET | Freq: Every day | ORAL | 2 refills | Status: DC

## 2022-10-07 NOTE — Assessment & Plan Note (Addendum)
  Lab Results  Component Value Date   HGBA1C 7.1 (A) 08/20/2022   Improved now On Jardiance and Bydureon On metformin XR and Toujeo 150 units nightly Followed by endocrinology Advised to follow diabetic diet On ARB and statin F/u CMP and lipid panel Diabetic eye exam: Advised to follow up with Ophthalmology for diabetic eye exam 

## 2022-10-07 NOTE — Progress Notes (Signed)
Virtual Visit via Telephone Note   This visit type was conducted via telephone. This format is felt to be most appropriate for this patient at this time.  The patient did not have access to video technology/had technical difficulties with video requiring transitioning to audio format only (telephone).  All issues noted in this document were discussed and addressed.  No physical exam could be performed with this format.  Evaluation Performed:  Follow-up visit  Date:  10/07/2022   ID:  Sydney Taylor, DOB 06-23-1966, MRN [id number]  Patient Location: Home Provider Location: Office/Clinic  Participants: Patient Location of Patient: Home Location of Provider: Telehealth Consent was obtain for visit to be over via telehealth. I verified that I am speaking with the correct person using two identifiers.  PCP:  Lindell Spar, MD   Chief Complaint: Follow up of chronic medical conditions  History of Present Illness:    Sydney Taylor is a 56 y.o. female with PMH of OSA, GERD, type II DM with diabetic neuropathy, HLD, tachycardia, depression with anxiety, fibromyalgia and obesity who has a televisit for complains of persistent nasal congestion and postnasal drip and.  Of her chronic medical conditions.  She has completed azithromycin for acute sinusitis.  She continues to have nasal congestion, postnasal drip and sinus pressure, which are chronic due to allergic sinusitis.  She has been doing antibiotic nasal rinse given by her ENT specialist.  She denies any fever, chills, dyspnea or wheezing currently.  She was given Clarinex, but has run out of it.  Type II DM: She is on Toujeo 150 units nightly, metformin 1000 mg daily and Jardiance 25 mg daily.  She is also placed on Bydureon. Followed by endocrinology.  She has CGM now.  She has chronic fatigue, but denies any polyuria or polyphagia.    The patient does not have symptoms concerning for COVID-19 infection (fever, chills,  cough, or new shortness of breath).   Past Medical, Surgical, Social History, Allergies, and Medications have been Reviewed.  Past Medical History:  Diagnosis Date   Anemia    Anxiety    Arrhythmia    Bipolar depression ([place]) 12/28/2019   Chronic fatigue    Chronic headaches    Depression    Diabetes mellitus without complication (HCC)    Fibromyalgia    GERD (gastroesophageal reflux disease)    High cholesterol    HLD (hyperlipidemia)    Hypertension    Insect bite    Migraine    Prediabetes    Sleep apnea    Urinary incontinence    Vasculitis ([place])    Past Surgical History:  Procedure Laterality Date   BIOPSY  09/04/2022   Procedure: BIOPSY;  Surgeon: Daneil Dolin, MD;  [facility];  Service: Endoscopy;;   CHOLECYSTECTOMY N/A 03/15/2022   Procedure: LAPAROSCOPIC CHOLECYSTECTOMY WITH INTRAOPERATIVE CHOLANGIOGRAM;  Surgeon: Virl Cagey, MD;  Location: [facility];  Service: General;  Laterality: N/A;   CHOLECYSTECTOMY  02/03/2022   COLONOSCOPY  2017   per patient normal and needs next one in 2027 (Dr. Posey Pronto)   COLONOSCOPY WITH PROPOFOL N/A 09/04/2022   Procedure: COLONOSCOPY WITH PROPOFOL;  Surgeon: Daneil Dolin, MD;  [facility];  Service: Endoscopy;  Laterality: N/A;   ESOPHAGOGASTRODUODENOSCOPY N/A 05/12/2020   Procedure: ESOPHAGOGASTRODUODENOSCOPY (EGD);  Surgeon: Daneil Dolin, MD;  [facility];  Service: Endoscopy;  Laterality: N/A;  11:15am   Joint fusion on foot Right  LIVER BIOPSY N/A 03/15/2022   Procedure: LIVER BIOPSY;  Surgeon: Virl Cagey, MD;  Location: [facility];  Service: General;  Laterality: N/A;   MALONEY DILATION N/A 05/12/2020   Procedure: Keturah Shavers;  Surgeon: Daneil Dolin, MD;  [facility];  Service: Endoscopy;  Laterality: N/A;   NASAL SEPTUM SURGERY     POLYPECTOMY  09/04/2022   Procedure: POLYPECTOMY;  Surgeon: Daneil Dolin, MD;  [facility];  Service:  Endoscopy;;   SINOSCOPY       Current Meds  Medication Sig   ALPRAZolam (XANAX) 0.5 MG tablet Take 0.25 mg by mouth daily as needed for anxiety.   Aspirin-Caffeine (BC FAST PAIN RELIEF PO) Take 1 packet by mouth daily as needed (pain).   B-12 Methylcobalamin 1000 MCG TBDP Take 1,000 mcg by mouth daily.   blood glucose meter kit and supplies KIT Inject 1 each into the skin daily. Check blood glucose once a day. Diagnosis code: E 11.9.   calcium carbonate (TUMS - DOSED IN MG ELEMENTAL CALCIUM) 500 MG chewable tablet Chew 1,000 mg by mouth daily as needed for indigestion or heartburn.   chlorthalidone (HYGROTON) 25 MG tablet Take 0.5 tablets (12.5 mg total) by mouth daily.   colestipol (COLESTID) 1 g tablet Take 2 tablets (2 g total) by mouth daily. Do not take within two hours of other medications.   Continuous Blood Gluc Sensor (DEXCOM G6 SENSOR) MISC by Does not apply route.   Continuous Blood Gluc Transmit (DEXCOM G6 TRANSMITTER) MISC by Does not apply route.   dicyclomine (BENTYL) 10 MG capsule Take 1 capsule (10 mg total) by mouth 4 (four) times daily -  before meals and at bedtime. For abdominal cramping and loose stool (Patient taking differently: Take 10 mg by mouth daily. For abdominal cramping and loose stool)   DULoxetine (CYMBALTA) 60 MG capsule Take 60 mg by mouth every morning.   empagliflozin (JARDIANCE) 25 MG TABS tablet Take 1 tablet (25 mg total) by mouth daily before breakfast.   Exenatide ER (BYDUREON BCISE) 2 MG/0.85ML AUIJ Inject weekly   glucose blood (ACCU-CHEK GUIDE) test strip 1 each by Other route in the morning, at noon, and at bedtime. Use as instructed   ibuprofen (ADVIL) 200 MG tablet Take 600 mg by mouth every 6 (six) hours as needed for moderate pain or headache.   insulin glargine, 2 Unit Dial, (TOUJEO MAX SOLOSTAR) 300 UNIT/ML Solostar Pen Inject 170 Units into the skin every morning. And pen needles 1/day (Patient taking differently: Inject 150 Units into the  skin every morning. And pen needles 1/day)   Insulin Pen Needle (PEN NEEDLES) 32G X 4 MM MISC Check BS 2 x a day   KLOR-CON M20 20 MEQ tablet TAKE 2 TABLETS BY MOUTH TWICE A DAY (Patient taking differently: Take 40 mEq by mouth daily.)   L-Methylfolate 15 MG TABS Take 15 mg by mouth daily at 6 (six) AM.   losartan (COZAAR) 25 MG tablet Take 1 tablet by mouth once daily   metFORMIN (GLUCOPHAGE-XR) 500 MG 24 hr tablet Take 2 tablets (1,000 mg total) by mouth daily with breakfast. (Patient taking differently: Take 1,000 mg by mouth daily with supper.)   Multiple Vitamins-Minerals (MULTIVITAMIN WOMEN 50+) TABS Take 1 tablet by mouth daily.   Na Sulfate-K Sulfate-Mg Sulf 17.5-3.13-1.6 GM/177ML SOLN As directed   ondansetron (ZOFRAN) 4 MG tablet Take 1 tablet (4 mg total) by mouth every 8 (eight) hours as needed for nausea  or vomiting.   pantoprazole (PROTONIX) 40 MG tablet Take 1 tablet by mouth once daily   rosuvastatin (CRESTOR) 10 MG tablet Take 1 tablet (10 mg total) by mouth daily. (Patient taking differently: Take 10 mg by mouth every other day.)   traZODone (DESYREL) 50 MG tablet Take 12.5 mg by mouth at bedtime.     Allergies:   Cephalexin   ROS:   Please see the history of present illness.     All other systems reviewed and are negative.   Labs/Other Tests and Data Reviewed:    Recent Labs: 03/29/2022: TSH 1.56 07/30/2022: ALT 20; BUN 14; Creatinine, Ser 1.01; Hemoglobin 12.6; Platelets 358; Potassium 4.2; Sodium 141   Recent Lipid Panel Lab Results  Component Value Date/Time   CHOL 136 12/14/2021 10:12 AM   TRIG 189 (H) 12/14/2021 10:12 AM   HDL 36 (L) 12/14/2021 10:12 AM   CHOLHDL 3.8 12/14/2021 10:12 AM   CHOLHDL 5.2 (H) 01/21/2020 10:09 AM   LDLCALC 68 12/14/2021 10:12 AM   LDLCALC 133 (H) 01/21/2020 10:09 AM    Wt Readings from Last 3 Encounters:  09/23/22 270 lb 3.2 oz (122.6 kg)  08/20/22 274 lb (124.3 kg)  07/30/22 273 lb 12.8 oz (124.2 kg)     ASSESSMENT &  PLAN:    Type 2 diabetes mellitus (HCC)  Lab Results  Component Value Date   HGBA1C 7.1 (A) 08/20/2022   Improved now On Jardiance and Bydureon On metformin XR and Toujeo 150 units nightly Followed by endocrinology Advised to follow diabetic diet On ARB and statin F/u CMP and lipid panel Diabetic eye exam: Advised to follow up with Ophthalmology for diabetic eye exam  Perennial allergic rhinitis Recently completed azithromycin for acute sinusitis Her current symptoms are likely due to allergic sinusitis Started Clarinex Uses Flonase Has been seen by ENT specialist  Essential hypertension BP Readings from Last 1 Encounters:  09/23/22 120/82   Well-controlled with losartan 25 mg QD and Metoprolol 25 mg BID Counseled for compliance with the medications Advised DASH diet and moderate exercise/walking, at least 150 mins/week  Chronic fatigue Chronic fatigue could be due to type II DM, MDD and insomnia She was recently told of vasculitis, needs rheumatology evaluation   Time:   Today, I have spent 16 minutes reviewing the chart, including problem list, medications, and with the patient with telehealth technology discussing the above problems.   Medication Adjustments/Labs and Tests Ordered: Current medicines are reviewed at length with the patient today.  Concerns regarding medicines are outlined above.   Tests Ordered: No orders of the defined types were placed in this encounter.   Medication Changes: No orders of the defined types were placed in this encounter.    Note: This dictation was prepared with Dragon dictation along with smaller phrase technology. Similar sounding words can be transcribed inadequately or may not be corrected upon review. Any transcriptional errors that result from this process are unintentional.      Disposition:  Follow up  Signed, Lindell Spar, MD  10/07/2022 10:23 AM     [place]

## 2022-10-07 NOTE — Patient Instructions (Signed)
Please take Clarinex for allergies. Continue to perform sinus rinse for nasal congestion.  Please continue to follow low carb diet and ambulate as tolerated.

## 2022-10-08 ENCOUNTER — Other Ambulatory Visit: Payer: Self-pay | Admitting: Endocrinology

## 2022-10-08 ENCOUNTER — Other Ambulatory Visit: Payer: Self-pay | Admitting: Internal Medicine

## 2022-10-11 ENCOUNTER — Telehealth: Payer: Self-pay | Admitting: *Deleted

## 2022-10-11 NOTE — Assessment & Plan Note (Signed)
BP Readings from Last 1 Encounters:  09/23/22 120/82   Well-controlled with losartan 25 mg QD and Metoprolol 25 mg BID Counseled for compliance with the medications Advised DASH diet and moderate exercise/walking, at least 150 mins/week

## 2022-10-11 NOTE — Assessment & Plan Note (Signed)
Chronic fatigue could be due to type II DM, MDD and insomnia She was recently told of vasculitis, needs rheumatology evaluation

## 2022-10-11 NOTE — Assessment & Plan Note (Signed)
Recently completed azithromycin for acute sinusitis Her current symptoms are likely due to allergic sinusitis Started Clarinex Uses Flonase Has been seen by ENT specialist

## 2022-10-11 NOTE — Telephone Encounter (Signed)
Patient contacted the office stating she was send at the end of September and would like her lab results. Please review and advise. Thanks!

## 2022-10-13 ENCOUNTER — Ambulatory Visit: Payer: Medicare Other

## 2022-10-14 ENCOUNTER — Encounter: Payer: Self-pay | Admitting: Internal Medicine

## 2022-10-14 ENCOUNTER — Other Ambulatory Visit: Payer: Self-pay | Admitting: Internal Medicine

## 2022-10-14 ENCOUNTER — Ambulatory Visit: Payer: Medicare Other | Admitting: Internal Medicine

## 2022-10-14 DIAGNOSIS — M7989 Other specified soft tissue disorders: Secondary | ICD-10-CM

## 2022-10-15 ENCOUNTER — Ambulatory Visit (INDEPENDENT_AMBULATORY_CARE_PROVIDER_SITE_OTHER): Payer: Medicare Other | Admitting: Internal Medicine

## 2022-10-15 ENCOUNTER — Encounter: Payer: Self-pay | Admitting: Internal Medicine

## 2022-10-15 DIAGNOSIS — J3089 Other allergic rhinitis: Secondary | ICD-10-CM

## 2022-10-15 DIAGNOSIS — J069 Acute upper respiratory infection, unspecified: Secondary | ICD-10-CM | POA: Diagnosis not present

## 2022-10-15 LAB — POCT INFLUENZA A/B
Influenza A, POC: NEGATIVE
Influenza B, POC: NEGATIVE

## 2022-10-15 NOTE — Patient Instructions (Signed)
Please come to office for flu and COVID testing.  Please continue Clarinex for allergies.  Okay to take Norel AD for nasal congestion.

## 2022-10-15 NOTE — Progress Notes (Signed)
Virtual Visit via Telephone Note   This visit type was conducted via telephone. This format is felt to be most appropriate for this patient at this time.  The patient did not have access to video technology/had technical difficulties with video requiring transitioning to audio format only (telephone).  All issues noted in this document were discussed and addressed.  No physical exam could be performed with this format.  Evaluation Performed:  Follow-up visit  Date:  10/15/2022   ID:  Sydney Taylor, DOB 09-Dec-1966, MRN [id number]  Patient Location: Home Provider Location: Office/Clinic  Participants: Patient Location of Patient: Home Location of Provider: Telehealth Consent was obtain for visit to be over via telehealth. I verified that I am speaking with the correct person using two identifiers.  PCP:  Lindell Spar, MD   Chief Complaint: Cough and nasal congestion  History of Present Illness:    Sydney Taylor is a 56 y.o. female who has a televisit for complaint of cough and nasal congestion for the last 3 days.  Her husband had similar symptoms that started 5 days ago and was COVID-negative.  She currently denies any fever or chills.  Denies any dyspnea or wheezing currently.  She recently had URTI, which was treated with azithromycin and felt better for about a week.  She also has history of allergic rhinitis, for which she was given Clarinex.  She is taking Mucinex currently for symptomatic relief without much improvement.  The patient does have symptoms concerning for COVID-19 infection (fever, chills, cough, or new shortness of breath).   Past Medical, Surgical, Social History, Allergies, and Medications have been Reviewed.  Past Medical History:  Diagnosis Date   Anemia    Anxiety    Arrhythmia    Bipolar depression ([place]) 12/28/2019   Chronic fatigue    Chronic headaches    Depression    Diabetes mellitus without complication (HCC)    Fibromyalgia     GERD (gastroesophageal reflux disease)    High cholesterol    HLD (hyperlipidemia)    Hypertension    Insect bite    Migraine    Prediabetes    Sleep apnea    Urinary incontinence    Vasculitis ([place])    Past Surgical History:  Procedure Laterality Date   BIOPSY  09/04/2022   Procedure: BIOPSY;  Surgeon: Daneil Dolin, MD;  [facility];  Service: Endoscopy;;   CHOLECYSTECTOMY N/A 03/15/2022   Procedure: LAPAROSCOPIC CHOLECYSTECTOMY WITH INTRAOPERATIVE CHOLANGIOGRAM;  Surgeon: Virl Cagey, MD;  Location: [facility];  Service: General;  Laterality: N/A;   CHOLECYSTECTOMY  02/03/2022   COLONOSCOPY  2017   per patient normal and needs next one in 2027 (Dr. Posey Pronto)   COLONOSCOPY WITH PROPOFOL N/A 09/04/2022   Procedure: COLONOSCOPY WITH PROPOFOL;  Surgeon: Daneil Dolin, MD;  [facility];  Service: Endoscopy;  Laterality: N/A;   ESOPHAGOGASTRODUODENOSCOPY N/A 05/12/2020   Procedure: ESOPHAGOGASTRODUODENOSCOPY (EGD);  Surgeon: Daneil Dolin, MD;  [facility];  Service: Endoscopy;  Laterality: N/A;  11:15am   Joint fusion on foot Right    LIVER BIOPSY N/A 03/15/2022   Procedure: LIVER BIOPSY;  Surgeon: Virl Cagey, MD;  Location: [facility];  Service: General;  Laterality: N/A;   MALONEY DILATION N/A 05/12/2020   Procedure: Keturah Shavers;  Surgeon: Daneil Dolin, MD;  [facility];  Service: Endoscopy;  Laterality: N/A;   NASAL SEPTUM SURGERY  POLYPECTOMY  09/04/2022   Procedure: POLYPECTOMY;  Surgeon: Daneil Dolin, MD;  [facility];  Service: Endoscopy;;   SINOSCOPY       Current Meds  Medication Sig   ALPRAZolam (XANAX) 0.5 MG tablet Take 0.25 mg by mouth daily as needed for anxiety.   Aspirin-Caffeine (BC FAST PAIN RELIEF PO) Take 1 packet by mouth daily as needed (pain).   B-12 Methylcobalamin 1000 MCG TBDP Take 1,000 mcg by mouth daily.   blood glucose meter kit and supplies KIT Inject 1 each into  the skin daily. Check blood glucose once a day. Diagnosis code: E 11.9.   calcium carbonate (TUMS - DOSED IN MG ELEMENTAL CALCIUM) 500 MG chewable tablet Chew 1,000 mg by mouth daily as needed for indigestion or heartburn.   chlorthalidone (HYGROTON) 25 MG tablet Take 1/2 (one-half) tablet by mouth once daily   colestipol (COLESTID) 1 g tablet Take 2 tablets (2 g total) by mouth daily. Do not take within two hours of other medications.   Continuous Blood Gluc Sensor (DEXCOM G6 SENSOR) MISC by Does not apply route.   Continuous Blood Gluc Transmit (DEXCOM G6 TRANSMITTER) MISC by Does not apply route.   desloratadine (CLARINEX) 5 MG tablet Take 1 tablet (5 mg total) by mouth daily.   dicyclomine (BENTYL) 10 MG capsule Take 1 capsule (10 mg total) by mouth 4 (four) times daily -  before meals and at bedtime. For abdominal cramping and loose stool (Patient taking differently: Take 10 mg by mouth daily. For abdominal cramping and loose stool)   DULoxetine (CYMBALTA) 60 MG capsule Take 60 mg by mouth every morning.   empagliflozin (JARDIANCE) 25 MG TABS tablet Take 1 tablet (25 mg total) by mouth daily before breakfast.   Exenatide ER (BYDUREON BCISE) 2 MG/0.85ML AUIJ Inject weekly   glucose blood (ACCU-CHEK GUIDE) test strip 1 each by Other route in the morning, at noon, and at bedtime. Use as instructed   ibuprofen (ADVIL) 200 MG tablet Take 600 mg by mouth every 6 (six) hours as needed for moderate pain or headache.   insulin glargine, 2 Unit Dial, (TOUJEO MAX SOLOSTAR) 300 UNIT/ML Solostar Pen Inject 170 Units into the skin every morning. And pen needles 1/day (Patient taking differently: Inject 150 Units into the skin every morning. And pen needles 1/day)   Insulin Pen Needle (PEN NEEDLES) 32G X 4 MM MISC Check BS 2 x a day   KLOR-CON M20 20 MEQ tablet TAKE 2 TABLETS BY MOUTH TWICE A DAY (Patient taking differently: Take 40 mEq by mouth daily.)   L-Methylfolate 15 MG TABS Take 15 mg by mouth daily at  6 (six) AM.   losartan (COZAAR) 25 MG tablet Take 1 tablet by mouth once daily   metFORMIN (GLUCOPHAGE-XR) 500 MG 24 hr tablet Take 2 tablets (1,000 mg total) by mouth daily with breakfast. (Patient taking differently: Take 1,000 mg by mouth daily with supper.)   Multiple Vitamins-Minerals (MULTIVITAMIN WOMEN 50+) TABS Take 1 tablet by mouth daily.   Na Sulfate-K Sulfate-Mg Sulf 17.5-3.13-1.6 GM/177ML SOLN As directed   ondansetron (ZOFRAN) 4 MG tablet Take 1 tablet (4 mg total) by mouth every 8 (eight) hours as needed for nausea or vomiting.   pantoprazole (PROTONIX) 40 MG tablet Take 1 tablet by mouth once daily   RELION PEN NEEDLES 31G X 6 MM MISC USE 1  ONCE DAILY   rosuvastatin (CRESTOR) 10 MG tablet Take 1 tablet (10 mg total) by mouth daily. (  Patient taking differently: Take 10 mg by mouth every other day.)   traZODone (DESYREL) 50 MG tablet Take 25 mg by mouth at bedtime.     Allergies:   Cephalexin   ROS:   Please see the history of present illness.     All other systems reviewed and are negative.   Labs/Other Tests and Data Reviewed:    Recent Labs: 03/29/2022: TSH 1.56 07/30/2022: ALT 20; BUN 14; Creatinine, Ser 1.01; Hemoglobin 12.6; Platelets 358; Potassium 4.2; Sodium 141   Recent Lipid Panel Lab Results  Component Value Date/Time   CHOL 136 12/14/2021 10:12 AM   TRIG 189 (H) 12/14/2021 10:12 AM   HDL 36 (L) 12/14/2021 10:12 AM   CHOLHDL 3.8 12/14/2021 10:12 AM   CHOLHDL 5.2 (H) 01/21/2020 10:09 AM   LDLCALC 68 12/14/2021 10:12 AM   LDLCALC 133 (H) 01/21/2020 10:09 AM    Wt Readings from Last 3 Encounters:  09/23/22 270 lb 3.2 oz (122.6 kg)  08/20/22 274 lb (124.3 kg)  07/30/22 273 lb 12.8 oz (124.2 kg)     ASSESSMENT & PLAN:    URTI Allergic rhinitis Check rapid flu and COVID RT-PCR Continue Mucinex as needed for cough and nasal congestion Continue Clarinex for allergies   Time:   Today, I have spent 8 minutes reviewing the chart, including problem  list, medications, and with the patient with telehealth technology discussing the above problems.   Medication Adjustments/Labs and Tests Ordered: Current medicines are reviewed at length with the patient today.  Concerns regarding medicines are outlined above.   Tests Ordered: No orders of the defined types were placed in this encounter.   Medication Changes: No orders of the defined types were placed in this encounter.    Note: This dictation was prepared with Dragon dictation along with smaller phrase technology. Similar sounding words can be transcribed inadequately or may not be corrected upon review. Any transcriptional errors that result from this process are unintentional.      Disposition:  Follow up  Signed, Lindell Spar, MD  10/15/2022 12:21 PM     [facility]

## 2022-10-15 NOTE — Addendum Note (Signed)
Addended by: Zacarias Pontes R on: 10/15/2022 03:14 PM   Modules accepted: Orders

## 2022-10-17 LAB — NOVEL CORONAVIRUS, NAA: SARS-CoV-2, NAA: NOT DETECTED

## 2022-10-18 ENCOUNTER — Encounter: Payer: Self-pay | Admitting: Internal Medicine

## 2022-10-24 ENCOUNTER — Encounter: Payer: Self-pay | Admitting: Internal Medicine

## 2022-10-25 ENCOUNTER — Other Ambulatory Visit: Payer: Self-pay | Admitting: Internal Medicine

## 2022-10-25 ENCOUNTER — Encounter: Payer: Self-pay | Admitting: Physical Medicine and Rehabilitation

## 2022-10-25 DIAGNOSIS — Z794 Long term (current) use of insulin: Secondary | ICD-10-CM

## 2022-10-25 DIAGNOSIS — E782 Mixed hyperlipidemia: Secondary | ICD-10-CM

## 2022-10-25 DIAGNOSIS — E559 Vitamin D deficiency, unspecified: Secondary | ICD-10-CM

## 2022-10-25 NOTE — Addendum Note (Signed)
Addended by: Collier Salina on: 10/25/2022 03:41 AM   Modules accepted: Orders

## 2022-10-30 ENCOUNTER — Ambulatory Visit: Payer: Medicare Other

## 2022-10-30 ENCOUNTER — Ambulatory Visit (INDEPENDENT_AMBULATORY_CARE_PROVIDER_SITE_OTHER): Payer: Medicare Other

## 2022-10-30 DIAGNOSIS — Z23 Encounter for immunization: Secondary | ICD-10-CM | POA: Diagnosis not present

## 2022-10-31 LAB — CMP14+EGFR
ALT: 26 IU/L (ref 0–32)
AST: 16 IU/L (ref 0–40)
Albumin/Globulin Ratio: 2 (ref 1.2–2.2)
Albumin: 4.1 g/dL (ref 3.8–4.9)
Alkaline Phosphatase: 103 IU/L (ref 44–121)
BUN/Creatinine Ratio: 14 (ref 9–23)
BUN: 14 mg/dL (ref 6–24)
Bilirubin Total: 0.3 mg/dL (ref 0.0–1.2)
CO2: 25 mmol/L (ref 20–29)
Calcium: 9.7 mg/dL (ref 8.7–10.2)
Chloride: 102 mmol/L (ref 96–106)
Creatinine, Ser: 0.97 mg/dL (ref 0.57–1.00)
Globulin, Total: 2.1 g/dL (ref 1.5–4.5)
Glucose: 171 mg/dL — ABNORMAL HIGH (ref 70–99)
Potassium: 4.5 mmol/L (ref 3.5–5.2)
Sodium: 143 mmol/L (ref 134–144)
Total Protein: 6.2 g/dL (ref 6.0–8.5)
eGFR: 68 mL/min/{1.73_m2} (ref >59)

## 2022-10-31 LAB — VITAMIN D 25 HYDROXY (VIT D DEFICIENCY, FRACTURES): Vit D, 25-Hydroxy: 51.3 ng/mL (ref 30.0–100.0)

## 2022-11-01 ENCOUNTER — Encounter: Payer: Self-pay | Admitting: Internal Medicine

## 2022-11-01 ENCOUNTER — Ambulatory Visit (INDEPENDENT_AMBULATORY_CARE_PROVIDER_SITE_OTHER): Payer: Medicare Other | Admitting: Internal Medicine

## 2022-11-01 VITALS — BP 121/83 | HR 93 | Temp 98.1°F | Ht 69.5 in | Wt 271.4 lb

## 2022-11-01 DIAGNOSIS — K588 Other irritable bowel syndrome: Secondary | ICD-10-CM

## 2022-11-01 DIAGNOSIS — K760 Fatty (change of) liver, not elsewhere classified: Secondary | ICD-10-CM

## 2022-11-01 MED ORDER — PANTOPRAZOLE SODIUM 40 MG PO TBEC: 40.0000 mg | DELAYED_RELEASE_TABLET | Freq: Two times a day (BID) | ORAL | 3 refills | Status: DC

## 2022-11-01 NOTE — Progress Notes (Unsigned)
Primary Care Physician:  Lindell Spar, MD Primary Gastroenterologist:  Dr. Gala Romney  Pre-Procedure History & Physical: HPI:  Sydney Taylor is a 56 y.o. female here for follow-up GERD recent diarrhea, fatty liver.  Colonic adenoma removed colonoscopy previously; 7-year interval follow-up recommended.  Diarrhea has resolved.  GIP panel was negative.  Peptic stricture dilated a few years ago at EGD.  No dysphagia at this time.  Taking Protonix 40 mg sporadically during the week and most of the time  not before meal.  Continues to be morbidly obese.  Hemoglobin A1c is running around the 7 range.  Feels better after getting her gallbladder out earlier in the year.  Liver biopsy revealed benign steatosis without fibrosis; weight loss recommended.  Currently, she is taking fiber supplement and probiotic daily; also takes  takes Bentyl  to "calm her bowels in the evening".  She does have intermittent nausea and breakthrough reflux symptoms.  Past Medical History:  Diagnosis Date   Anemia    Anxiety    Arrhythmia    Bipolar depression ([place]) 12/28/2019   Chronic fatigue    Chronic headaches    Depression    Diabetes mellitus without complication (HCC)    Fibromyalgia    GERD (gastroesophageal reflux disease)    High cholesterol    HLD (hyperlipidemia)    Hypertension    Insect bite    Migraine    Prediabetes    Sleep apnea    Urinary incontinence    Vasculitis ([place])     Past Surgical History:  Procedure Laterality Date   BIOPSY  09/04/2022   Procedure: BIOPSY;  Surgeon: Daneil Dolin, MD;  [facility];  Service: Endoscopy;;   CHOLECYSTECTOMY N/A 03/15/2022   Procedure: LAPAROSCOPIC CHOLECYSTECTOMY WITH INTRAOPERATIVE CHOLANGIOGRAM;  Surgeon: Virl Cagey, MD;  Location: [facility];  Service: General;  Laterality: N/A;   CHOLECYSTECTOMY  02/03/2022   COLONOSCOPY  2017   per patient normal and needs next one in 2027 (Dr. Posey Pronto)   COLONOSCOPY WITH PROPOFOL N/A  09/04/2022   Procedure: COLONOSCOPY WITH PROPOFOL;  Surgeon: Daneil Dolin, MD;  [facility];  Service: Endoscopy;  Laterality: N/A;   ESOPHAGOGASTRODUODENOSCOPY N/A 05/12/2020   Procedure: ESOPHAGOGASTRODUODENOSCOPY (EGD);  Surgeon: Daneil Dolin, MD;  [facility];  Service: Endoscopy;  Laterality: N/A;  11:15am   Joint fusion on foot Right    LIVER BIOPSY N/A 03/15/2022   Procedure: LIVER BIOPSY;  Surgeon: Virl Cagey, MD;  [facility];  Service: General;  Laterality: N/A;   MALONEY DILATION N/A 05/12/2020   Procedure: Keturah Shavers;  Surgeon: Daneil Dolin, MD;  [facility];  Service: Endoscopy;  Laterality: N/A;   NASAL SEPTUM SURGERY     POLYPECTOMY  09/04/2022   Procedure: POLYPECTOMY;  Surgeon: Daneil Dolin, MD;  [facility];  Service: Endoscopy;;   SINOSCOPY      Prior to Admission medications   Medication Sig Start Date End Date Taking? Authorizing Provider  ALPRAZolam Duanne Moron) 0.5 MG tablet Take 0.25 mg by mouth daily as needed for anxiety. 07/05/20  Yes [provider]  Aspirin-Caffeine (BC FAST PAIN RELIEF PO) Take 1 packet by mouth daily as needed (pain).   Yes [provider]  B-12 Methylcobalamin 1000 MCG TBDP Take 1,000 mcg by mouth daily.   Yes [provider]  blood glucose meter kit and supplies KIT Inject 1 each into the skin daily.  Check blood glucose once a day. Diagnosis code: E 11.9. 02/21/21  Yes Gosrani, Nimish C, MD  calcium carbonate (TUMS - DOSED IN MG ELEMENTAL CALCIUM) 500 MG chewable tablet Chew 1,000 mg by mouth daily as needed for indigestion or heartburn.   Yes [provider]  chlorthalidone (HYGROTON) 25 MG tablet Take 1/2 (one-half) tablet by mouth once daily 10/14/22  Yes Lindell Spar, MD  Continuous Blood Gluc Sensor (DEXCOM G6 SENSOR) MISC by Does not apply route.   Yes [provider]  Continuous Blood Gluc Transmit (DEXCOM G6  TRANSMITTER) MISC by Does not apply route.   Yes [provider]  desloratadine (CLARINEX) 5 MG tablet Take 1 tablet (5 mg total) by mouth daily. 10/07/22  Yes Lindell Spar, MD  dicyclomine (BENTYL) 10 MG capsule Take 1 capsule (10 mg total) by mouth 4 (four) times daily -  before meals and at bedtime. For abdominal cramping and loose stool Patient taking differently: Take 10 mg by mouth daily. For abdominal cramping and loose stool 08/12/22  Yes Annitta Needs, NP  DULoxetine (CYMBALTA) 60 MG capsule Take 60 mg by mouth every morning. 05/14/21  Yes [provider]  empagliflozin (JARDIANCE) 25 MG TABS tablet Take 1 tablet (25 mg total) by mouth daily before breakfast. 08/20/22  Yes Elayne Snare, MD  Exenatide ER (BYDUREON BCISE) 2 MG/0.85ML AUIJ Inject weekly 08/20/22  Yes Elayne Snare, MD  glucose blood (ACCU-CHEK GUIDE) test strip 1 each by Other route in the morning, at noon, and at bedtime. Use as instructed 08/07/21  Yes Ailene Ards, NP  ibuprofen (ADVIL) 200 MG tablet Take 600 mg by mouth every 6 (six) hours as needed for moderate pain or headache.   Yes [provider]  insulin glargine, 2 Unit Dial, (TOUJEO MAX SOLOSTAR) 300 UNIT/ML Solostar Pen Inject 170 Units into the skin every morning. And pen needles 1/day Patient taking differently: Inject 150 Units into the skin every morning. And pen needles 1/day 05/24/22  Yes Elayne Snare, MD  Insulin Pen Needle (PEN NEEDLES) 32G X 4 MM MISC Check BS 2 x a day 10/22/21  Yes Renato Shin, MD  KLOR-CON M20 20 MEQ tablet TAKE 2 TABLETS BY MOUTH TWICE A DAY Patient taking differently: Take 40 mEq by mouth daily. 01/21/22  Yes Lindell Spar, MD  L-Methylfolate 15 MG TABS Take 15 mg by mouth daily at 6 (six) AM.   Yes [provider]  losartan (COZAAR) 25 MG tablet Take 1 tablet by mouth once daily 08/02/22  Yes Lindell Spar, MD  metFORMIN (GLUCOPHAGE-XR) 500 MG 24 hr tablet Take 2 tablets (1,000 mg total) by mouth daily  with breakfast. Patient taking differently: Take 1,000 mg by mouth daily with supper. 08/02/22  Yes Elayne Snare, MD  metoprolol tartrate (LOPRESSOR) 25 MG tablet Take 1 tablet (25 mg total) by mouth 2 (two) times daily. 02/28/22 11/01/22 Yes Fay Records, MD  Multiple Vitamins-Minerals (MULTIVITAMIN WOMEN 50+) TABS Take 1 tablet by mouth daily.   Yes [provider]  pantoprazole (PROTONIX) 40 MG tablet Take 1 tablet by mouth once daily 10/08/22  Yes Patel, Colin Broach, MD  RELION PEN NEEDLES 31G X 6 MM MISC USE 1  ONCE DAILY 10/08/22  Yes Elayne Snare, MD  rosuvastatin (CRESTOR) 10 MG tablet Take 1 tablet (10 mg total) by mouth daily. Patient taking differently: Take 10 mg by mouth every other day. 11/07/21  Yes Lindell Spar, MD  traZODone (  DESYREL) 50 MG tablet Take 25 mg by mouth at bedtime. 10/08/21  Yes [provider]    Allergies as of 11/01/2022 - Review Complete 11/01/2022  Allergen Reaction Noted   Cephalexin Itching, Swelling, and Other (See Comments) 05/02/2020    Family History  Problem Relation Age of Onset   Hypertension Father    Hyperlipidemia Father    Diabetes Father    Stroke Father    Depression Father    Anxiety disorder Father    Hypertension Mother    Hyperlipidemia Mother    Diabetes Mellitus II Mother    Congestive Heart Failure Mother    Heart attack Mother    Hypertension Sister    Diabetes Sister    Hypertension Brother    Autoimmune disease Brother    Diabetes Maternal Grandmother    Lupus Paternal Grandmother    Depression Paternal Grandmother    Anxiety disorder Paternal Grandmother    Depression Maternal Aunt    Anxiety disorder Maternal Aunt    Colon cancer Neg Hx    Esophageal cancer Neg Hx    Gastric cancer Neg Hx     Social History   Socioeconomic History   Marital status: Married    Spouse name: Not on file   Number of children: 0   Years of education: Not on file   Highest education level: Bachelor's degree (e.g.,  BA, AB, BS)  Occupational History   Occupation: research associate/drug development  Tobacco Use   Smoking status: Former    Types: Cigarettes    Quit date: 12/30/1982    Years since quitting: 39.8   Smokeless tobacco: Never  Vaping Use   Vaping Use: Never used  Substance and Sexual Activity   Alcohol use: Not Currently    Alcohol/week: 0.0 standard drinks of alcohol   Drug use: Never   Sexual activity: Yes    Birth control/protection: Post-menopausal  Other Topics Concern   Not on file  Social History Narrative   Married for 3 years.Lives with husband.Bachelors in Education officer, museum.   Right handed   Caffeine: 1-2 per day    Social Determinants of Health   Financial Resource Strain: Low Risk  (06/04/2022)   Overall Financial Resource Strain (CARDIA)    Difficulty of Paying Living Expenses: Not hard at all  Food Insecurity: No Food Insecurity (06/04/2022)   Hunger Vital Sign    Worried About Running Out of Food in the Last Year: Never true    Ran Out of Food in the Last Year: Never true  Transportation Needs: No Transportation Needs (06/04/2022)   PRAPARE - Hydrologist (Medical): No    Lack of Transportation (Non-Medical): No  Physical Activity: Inactive (06/04/2022)   Exercise Vital Sign    Days of Exercise per Week: 0 days    Minutes of Exercise per Session: 0 min  Stress: No Stress Concern Present (06/04/2022)   [place]    Feeling of Stress : Not at all  Social Connections: Moderately Integrated (06/04/2022)   Social Connection and Isolation Panel [NHANES]    Frequency of Communication with Friends and Family: More than three times a week    Frequency of Social Gatherings with Friends and Family: More than three times a week    Attends Religious Services: More than 4 times per year    Active Member of [employer] or Organizations: No    Attends Archivist Meetings: Never  Marital  Status: Married  Human resources officer Violence: Not At Risk (06/04/2022)   Humiliation, Afraid, Rape, and Kick questionnaire    Fear of Current or Ex-Partner: No    Emotionally Abused: No    Physically Abused: No    Sexually Abused: No    Review of Systems: See HPI, otherwise negative ROS  Physical Exam: BP 121/83 (BP Location: Right Arm, Patient Position: Sitting, Cuff Size: Large)   Pulse 93   Temp 98.1 F (36.7 C) (Oral)   Ht 5' 9.5" (1.765 m)   Wt 271 lb 6.4 oz (123.1 kg)   LMP  (LMP Unknown)   SpO2 96%   BMI 39.50 kg/m  General:   Alert,  Well-developed, well-nourished, pleasant and cooperative in NAD Mouth:  No deformity or lesions. Neck:  Supple; no masses or thyromegaly. No significant cervical adenopathy. Lungs:  Clear throughout to auscultation.   No wheezes, crackles, or rhonchi. No acute distress. Heart:  Regular rate and rhythm; no murmurs, clicks, rubs,  or gallops. Abdomen: Non-distended, normal bowel sounds.  Soft and nontender without appreciable mass or hepatosplenomegaly.  Pulses:  Normal pulses noted. Extremities:  Without clubbing or edema.  Impression/Plan: 56 year old lady with GERD, fibromyalgia, element of IBS doing fairly well today.  She her nausea and reflux are likely breaking through once daily PPI  - not taken before meal.  No alarm symptoms at this time.  She does have a history of soft peptic stricture.  Self-limiting episode of diarrhea earlier this year could have been infectious in origin or an exacerbation of IBS.  She has taken Glucophage for a long time without any coincidental symptoms.  Findings on liver biopsy reassuring for only benign steatosis.  She feels she is clinically improved after getting her gallbladder out.  No evidence of microscopic colitis on prior colon biopsies.  History of adenoma  -2030 surveillance examination recommended.  Recommendations:  As discussed, it is best to take PPI 30 minutes to an hour before meals.  You are  having some symptoms which go with inadequate control of reflux.  As discussed, for the next 3 months lets increase  Protonix 40 mg twice daily ( to be taken before breakfast and before supper (dispense 60 with 3 refills).  Good safety record of this class of medications discussed  Continue probiotic and fiber supplement.  May continue dicyclomine or Bentyl in the evening as needed  Weight loss and physical activity recommended.  Would strive for a 10 to 15 pound weight loss between now and the spring of next year.  Surveillance colonoscopy 2030.  Office visit here in 3 months     Notice: This dictation was prepared with Dragon dictation along with smaller phrase technology. Any transcriptional errors that result from this process are unintentional and may not be corrected upon review.

## 2022-11-01 NOTE — Patient Instructions (Addendum)
It was good to see you again today!  As discussed, it is best to take the acid blocker 30 minutes to an hour before meals.  You are having some symptoms which go with inadequate control of reflux.  As discussed, for the next 3 months lets increase your Protonix 40 mg twice daily ( to be taken before breakfast and before supper (dispense 60 with 3 refills).  Good safety record of this class of medications discussed  Continue probiotic and fiber supplement.  May continue dicyclomine or Bentyl in the evening as needed  Weight loss and physical activity recommended.  Would strive for a 10 to 15 pound weight loss between now and the spring of next year.  Surveillance colonoscopy 2030.  Office visit here in 3 months

## 2022-11-04 ENCOUNTER — Encounter: Payer: Self-pay | Admitting: Dietician

## 2022-11-04 ENCOUNTER — Encounter: Payer: Medicare Other | Attending: Endocrinology | Admitting: Dietician

## 2022-11-04 VITALS — Ht 69.5 in | Wt 271.0 lb

## 2022-11-04 DIAGNOSIS — E119 Type 2 diabetes mellitus without complications: Secondary | ICD-10-CM | POA: Diagnosis not present

## 2022-11-04 NOTE — Patient Instructions (Addendum)
Take care of yourself! Aim to be active most days.  Exercise is great for mental health.  Great job on starting with the decision.  Think about how this can realistically fit with your day. Balanced meals (protein, unprocessed carbohydrate, vegetables)  Mindfulness:  Consistently scheduled meal - avoid skipping  Choices  Eat slowly  Away from distraction (sitting in kitchen or dining room)  Stop eating when satisfied  Before a snack ask, "Am I hungry or eating for another reason?"   "What can I do instead if I am not hungry?"  Try to find something every day that brings you joy!    Breakfast ideas  Oatmeal, nuts/seeds, fresh fruit, artificial sweetener or 1 tsp agave, maple syrup or honey Vegetarian sausage, roasted potatoes, fruit Scrambled tofu, roasted potatoes, fruit Breakfast burrito on low carb tortilla (use leftover scrambled tofu and roasted potatoes or fat free refried beans and salsa) See handout for ideas for breakfast and other meals.   Resources: you tube PBwithJ.ca Well your world Nicola Girt

## 2022-11-04 NOTE — Progress Notes (Unsigned)
Diabetes Self-Management Education  Visit Type: First/Initial  Appt. Start Time: 1520 Appt. End Time: 6063  11/05/2022  Sydney Taylor, identified by name and date of birth, is a 56 y.o. female with a diagnosis of Diabetes: Type 2.   ASSESSMENT Patient is here today alone. Referral diagnosis:  Type 2 Diabetes (Dr. Dwyane Dee)  She states that she is having some lower blood glucose (70-80) when waking and when she has not eaten for 4-5 hours. She would like to learn how to structure her meals better.  She states that due to her low energy and depressed mood she tends to overeat "abuse" carbohydrates.  She states that she does not think that her diet is very good.   History includes:  Type 2 Diabetes, MTHFR, Depression (sees a psychiatrist, and counselor, and [facility] treatment), OSA on c-pap, HLD, HTN, vitamin B-12 deficiency, fibromyalgia Medications:  Jardiance, Exenatide, Toujeo 150 units q am, metformin, rosuvastatin, Methly volate, vitamin B-12, potassium, MVI, Bentyl, CoQ-10 Labs noted to include A1C 7.1% 08/20/2022 decreased from 8% 03/12/2022 CGM:  Dexcom G7 (uses reader)  69.5" 271 lbs 11/04/2022 274 lbs 08/20/2022  Patient lives with her husband.  Patient does most of the cooking and they share shopping. She is vegetarian and her husband is willing to eat this way most of the time. Grazes She started plant based eating in her 20's due to GI issues. She is on disability- chronic pain and low energy. 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 Sleep is fair - wears c-pap and sleeps lightly but feels better with the c-pap Stress - fair (external), lack of control of diabetes, financial Saw a RD in the past who was not positive about vegetarian eating. Joined a gym but lost motivation when it got hot.  They are planning to restart this Thursday.  Height 5' 9.5" (1.765 m), weight 271 lb (122.9 kg). Body mass index is 39.45 kg/m.   Diabetes Self-Management  Education - 11/04/22 1548       Visit Information   Visit Type First/Initial      Initial Visit   Diabetes Type Type 2    Date Diagnosed 2018    Are you currently following a meal plan? Yes    What type of meal plan do you follow? vegetarian, diabetic    Are you taking your medications as prescribed? Yes      Health Coping   How would you rate your overall health? Fair      Psychosocial Assessment   Patient Belief/Attitude about Diabetes Other (comment)   helpless   What is the hardest part about your diabetes right now, causing you the most concern, or is the most worrisome to you about your diabetes?   Being active;Making healty food and beverage choices    Self-care barriers None    Self-management support Doctor's office    Other persons present Patient    Patient Concerns Nutrition/Meal planning    Special Needs None    Preferred Learning Style No preference indicated    Learning Readiness Ready    How often do you need to have someone help you when you read instructions, pamphlets, or other written materials from your doctor or pharmacy? 1 - Never    What is the last grade level you completed in school? BS biology      Pre-Education Assessment   Patient understands the diabetes disease and treatment process. Needs Review    Patient understands incorporating nutritional management into  lifestyle. Needs Review    Patient undertands incorporating physical activity into lifestyle. Needs Review    Patient understands using medications safely. Needs Review    Patient understands monitoring blood glucose, interpreting and using results Needs Review    Patient understands prevention, detection, and treatment of acute complications. Needs Review    Patient understands prevention, detection, and treatment of chronic complications. Needs Review    Patient understands how to develop strategies to address psychosocial issues. Needs Review    Patient understands how to develop  strategies to promote health/change behavior. Needs Review      Complications   Last HgB A1C per patient/outside source 7.1 %   07/2022 decreased from 8% 02/2022   How often do you check your blood sugar? > 4 times/day    Fasting Blood glucose range (mg/dL) 70-129    Postprandial Blood glucose range (mg/dL) 130-179    Number of hypoglycemic episodes per month 0    Number of hyperglycemic episodes ( >'200mg'$ /dL): Rare    Can you tell when your blood sugar is high? Yes    What do you do if your blood sugar is high? blurry vision    Have you had a dilated eye exam in the past 12 months? Yes    Have you had a dental exam in the past 12 months? Yes    Are you checking your feet? Yes    How many days per week are you checking your feet? 2      Dietary Intake   Breakfast sunflower seeds, 2 canned biscuits, banana, coffee   grazes   Lunch 2 hashbrowns    Snack (afternoon) banana    Dinner vegetarian chili, white rice, TVP    Snack (evening) bread, raw vegetables with ranch, nuts, sunnflower seeds    Beverage(s) water, coffee with sugar free flavored creamer, iced tea (unsweetened or with splenda), hint, seltzer water, occasional diet soda, no alcohol      Activity / Exercise   Activity / Exercise Type ADL's      Patient Education   Previous Diabetes Education Yes (please comment)   about 10 years ago   Disease Pathophysiology Other (comment)   review of insulin resistance   Healthy Eating Meal options for control of blood glucose level and chronic complications.;Plate Method;Other (comment)   whole foods plant based eating   Being Active Role of exercise on diabetes management, blood pressure control and cardiac health.    Medications Reviewed patients medication for diabetes, action, purpose, timing of dose and side effects.    Monitoring Yearly dilated eye exam;Daily foot exams      Individualized Goals (developed by patient)   Nutrition General guidelines for healthy choices and portions  discussed    Physical Activity Exercise 3-5 times per week;30 minutes per day    Medications take my medication as prescribed    Monitoring  Consistenly use CGM    Problem Solving Eating Pattern;Addressing barriers to behavior change      Patient Self-Evaluation of Goals - Patient rates self as meeting previously set goals (% of time)   Nutrition 50 - 75 % (half of the time)    Physical Activity < 25% (hardly ever/never)    Medications >75% (most of the time)    Monitoring >75% (most of the time)    Problem Solving and behavior change strategies  >75% (most of the time)    Reducing Risk (treating acute and chronic complications) >29% (most of the time)  Health Coping 50 - 75 % (half of the time)      Post-Education Assessment   Patient understands the diabetes disease and treatment process. Demonstrates understanding / competency    Patient understands incorporating nutritional management into lifestyle. Comprehends key points    Patient undertands incorporating physical activity into lifestyle. Comprehends key points    Patient understands using medications safely. Demonstrates understanding / competency    Patient understands monitoring blood glucose, interpreting and using results Demonstrates understanding / competency    Patient understands prevention, detection, and treatment of acute complications. Demonstrates understanding / competency    Patient understands prevention, detection, and treatment of chronic complications. Demonstrates understanding / competency    Patient understands how to develop strategies to address psychosocial issues. Comprehends key points    Patient understands how to develop strategies to promote health/change behavior. Comprehends key points      Outcomes   Future DMSE 2 months    Program Status Not Completed      Subsequent Visit   Since your last visit have you continued or begun to take your medications as prescribed? Yes              Individualized Plan for Diabetes Self-Management Training:   Learning Objective:  Patient will have a greater understanding of diabetes self-management. Patient education plan is to attend individual and/or group sessions per assessed needs and concerns.   Plan:   Patient Instructions  Take care of yourself! Aim to be active most days.  Exercise is great for mental health.  Great job on starting with the decision.  Think about how this can realistically fit with your day. Balanced meals (protein, unprocessed carbohydrate, vegetables)  Mindfulness:  Consistently scheduled meal - avoid skipping  Choices  Eat slowly  Away from distraction (sitting in kitchen or dining room)  Stop eating when satisfied  Before a snack ask, "Am I hungry or eating for another reason?"   "What can I do instead if I am not hungry?"  Try to find something every day that brings you joy!    Breakfast ideas  Oatmeal, nuts/seeds, fresh fruit, artificial sweetener or 1 tsp agave, maple syrup or honey Vegetarian sausage, roasted potatoes, fruit Scrambled tofu, roasted potatoes, fruit Breakfast burrito on low carb tortilla (use leftover scrambled tofu and roasted potatoes or fat free refried beans and salsa) See handout for ideas for breakfast and other meals.   Resources: you tube PBwithJ.ca Well your world Nicola Girt   Expected Outcomes:     Education material provided: ADA - How to Thrive: A Guide for Your Journey with Diabetes and Diabetes Resources; [employer] of Lifestyle Medicine packet, Whole food plant based meal ideas for diabetes  If problems or questions, patient to contact team via:  Phone  Future DSME appointment: 2 months

## 2022-11-12 ENCOUNTER — Ambulatory Visit: Payer: Medicare Other | Admitting: Orthopedic Surgery

## 2022-11-15 ENCOUNTER — Other Ambulatory Visit: Payer: Self-pay | Admitting: Endocrinology

## 2022-11-15 ENCOUNTER — Other Ambulatory Visit: Payer: Self-pay | Admitting: Internal Medicine

## 2022-11-18 ENCOUNTER — Other Ambulatory Visit: Payer: Self-pay

## 2022-11-18 ENCOUNTER — Encounter: Payer: Self-pay | Admitting: Internal Medicine

## 2022-11-18 MED ORDER — POTASSIUM CHLORIDE CRYS ER 20 MEQ PO TBCR: 40.0000 meq | EXTENDED_RELEASE_TABLET | Freq: Two times a day (BID) | ORAL | 0 refills | Status: DC

## 2022-11-25 ENCOUNTER — Ambulatory Visit: Payer: Medicare Other | Admitting: Endocrinology

## 2022-11-25 ENCOUNTER — Encounter: Payer: Self-pay | Admitting: Orthopedic Surgery

## 2022-11-25 ENCOUNTER — Ambulatory Visit (INDEPENDENT_AMBULATORY_CARE_PROVIDER_SITE_OTHER): Payer: Medicare Other

## 2022-11-25 ENCOUNTER — Ambulatory Visit: Payer: Medicare Other | Admitting: Orthopedic Surgery

## 2022-11-25 VITALS — BP 127/85 | HR 99 | Ht 70.0 in | Wt 274.0 lb

## 2022-11-25 DIAGNOSIS — M5432 Sciatica, left side: Secondary | ICD-10-CM

## 2022-11-25 NOTE — Patient Instructions (Signed)
Referral for PT  Allow for at least 6 weeks of PT and if not getting better, we can consider obtaining an MRI

## 2022-11-25 NOTE — Progress Notes (Signed)
New Patient Visit  Assessment: Sydney Taylor is a 56 y.o. female with the following: 1. Sciatica, left side  Plan: Sydney Taylor has chronic lower back pain.  In the past couple years, she has developed progressively worsening numbness and tingling in the anterior left thigh.  This is most likely related to her back.  She has not done physical therapy for several years, and I recommend we start there.  Advised her to work with therapy at least 6 weeks.  If she is not noticing any improvements, she should contact the clinic, we can consider obtaining an MRI.  She states understanding.  Follow-up as needed.  Follow-up: Return if symptoms worsen or fail to improve.  Subjective:  Chief Complaint  Patient presents with   Back Pain    Numbness in left thigh runs down side and front for couple of years, has history of right sciatica blew out my L4 and 5 disc 25 years ago roughly    History of Present Illness: Sydney Taylor is a 56 y.o. female who presents for evaluation of low back pain.  She has had issues with her back for many years.  She states that she had a herniated disc at L4-5 approximately 25 years ago.  As result, she has had chronic issues, with occasional radiating pains into the right posterior lateral hip.  More recently, she states that she started to develop some numbness and tingling, radiating pains down the lateral aspect of the left thigh.  Numbness and tingling in the anterior thigh.  She has tried some injections in the past with limited improvement in her symptoms.  She takes medications as needed.  She has not worked with physical therapy for over 15 years.  She states that her symptoms in the left thigh get worse as she stands longer.   Review of Systems: No fevers or chills + numbness and tingling No chest pain No shortness of breath No bowel or bladder dysfunction No GI distress No headaches   Medical History:  Past Medical History:  Diagnosis  Date   Anemia    Anxiety    Arrhythmia    Bipolar depression ([place]) 12/28/2019   Chronic fatigue    Chronic headaches    Depression    Diabetes mellitus without complication (HCC)    Fibromyalgia    GERD (gastroesophageal reflux disease)    High cholesterol    HLD (hyperlipidemia)    Hypertension    Insect bite    Migraine    Prediabetes    Sleep apnea    Urinary incontinence    Vasculitis ([place])     Past Surgical History:  Procedure Laterality Date   BIOPSY  09/04/2022   Procedure: BIOPSY;  Surgeon: Daneil Dolin, MD;  Location: [facility];  Service: Endoscopy;;   CHOLECYSTECTOMY N/A 03/15/2022   Procedure: LAPAROSCOPIC CHOLECYSTECTOMY WITH INTRAOPERATIVE CHOLANGIOGRAM;  Surgeon: Virl Cagey, MD;  Location: [facility];  Service: General;  Laterality: N/A;   CHOLECYSTECTOMY  02/03/2022   COLONOSCOPY  2017   per patient normal and needs next one in 2027 (Dr. Posey Pronto)   COLONOSCOPY WITH PROPOFOL N/A 09/04/2022   Procedure: COLONOSCOPY WITH PROPOFOL;  Surgeon: Daneil Dolin, MD;  Location: [facility];  Service: Endoscopy;  Laterality: N/A;   ESOPHAGOGASTRODUODENOSCOPY N/A 05/12/2020   Procedure: ESOPHAGOGASTRODUODENOSCOPY (EGD);  Surgeon: Daneil Dolin, MD;  Location: [facility];  Service: Endoscopy;  Laterality: N/A;  11:15am   Joint fusion on foot  Right    LIVER BIOPSY N/A 03/15/2022   Procedure: LIVER BIOPSY;  Surgeon: Virl Cagey, MD;  Location: [facility];  Service: General;  Laterality: N/A;   MALONEY DILATION N/A 05/12/2020   Procedure: Keturah Shavers;  Surgeon: Daneil Dolin, MD;  Location: [facility];  Service: Endoscopy;  Laterality: N/A;   NASAL SEPTUM SURGERY     POLYPECTOMY  09/04/2022   Procedure: POLYPECTOMY;  Surgeon: Daneil Dolin, MD;  Location: [facility];  Service: Endoscopy;;   SINOSCOPY      Family History  Problem Relation Age of Onset   Hypertension Father    Hyperlipidemia Father    Diabetes Father    Stroke Father     Depression Father    Anxiety disorder Father    Hypertension Mother    Hyperlipidemia Mother    Diabetes Mellitus II Mother    Congestive Heart Failure Mother    Heart attack Mother    Hypertension Sister    Diabetes Sister    Hypertension Brother    Autoimmune disease Brother    Diabetes Maternal Grandmother    Lupus Paternal Grandmother    Depression Paternal Grandmother    Anxiety disorder Paternal Grandmother    Depression Maternal Aunt    Anxiety disorder Maternal Aunt    Colon cancer Neg Hx    Esophageal cancer Neg Hx    Gastric cancer Neg Hx    Social History   Tobacco Use   Smoking status: Former    Types: Cigarettes    Quit date: 12/30/1982    Years since quitting: 39.9   Smokeless tobacco: Never  Vaping Use   Vaping Use: Never used  Substance Use Topics   Alcohol use: Not Currently    Alcohol/week: 0.0 standard drinks of alcohol   Drug use: Never    Allergies  Allergen Reactions   Cephalexin Itching, Swelling and Other (See Comments)    Current Meds  Medication Sig   ALPRAZolam (XANAX) 0.5 MG tablet Take 0.25 mg by mouth daily as needed for anxiety.   Aspirin-Caffeine (BC FAST PAIN RELIEF PO) Take 1 packet by mouth daily as needed (pain).   B-12 Methylcobalamin 1000 MCG TBDP Take 1,000 mcg by mouth daily.   blood glucose meter kit and supplies KIT Inject 1 each into the skin daily. Check blood glucose once a day. Diagnosis code: E 11.9.   calcium carbonate (TUMS - DOSED IN MG ELEMENTAL CALCIUM) 500 MG chewable tablet Chew 1,000 mg by mouth daily as needed for indigestion or heartburn.   chlorthalidone (HYGROTON) 25 MG tablet Take 1/2 (one-half) tablet by mouth once daily   Continuous Blood Gluc Sensor (DEXCOM G6 SENSOR) MISC by Does not apply route.   Continuous Blood Gluc Transmit (DEXCOM G6 TRANSMITTER) MISC by Does not apply route.   desloratadine (CLARINEX) 5 MG tablet Take 1 tablet (5 mg total) by mouth daily.   dicyclomine (BENTYL) 10 MG capsule  Take 1 capsule (10 mg total) by mouth 4 (four) times daily -  before meals and at bedtime. For abdominal cramping and loose stool (Patient taking differently: Take 10 mg by mouth daily. For abdominal cramping and loose stool)   DULoxetine (CYMBALTA) 60 MG capsule Take 60 mg by mouth every morning.   empagliflozin (JARDIANCE) 25 MG TABS tablet Take 1 tablet (25 mg total) by mouth daily before breakfast.   Exenatide ER (BYDUREON BCISE) 2 MG/0.85ML AUIJ Inject weekly   glucose blood (ACCU-CHEK GUIDE) test strip 1  each by Other route in the morning, at noon, and at bedtime. Use as instructed   insulin glargine, 2 Unit Dial, (TOUJEO MAX SOLOSTAR) 300 UNIT/ML Solostar Pen Inject 170 Units into the skin every morning. And pen needles 1/day (Patient taking differently: Inject 150 Units into the skin every morning. And pen needles 1/day)   Insulin Pen Needle (PEN NEEDLES) 32G X 4 MM MISC Check BS 2 x a day   L-Methylfolate 15 MG TABS Take 15 mg by mouth daily at 6 (six) AM.   losartan (COZAAR) 25 MG tablet Take 1 tablet by mouth once daily   metFORMIN (GLUCOPHAGE-XR) 500 MG 24 hr tablet Take 2 tablets (1,000 mg total) by mouth daily with breakfast. (Patient taking differently: Take 1,000 mg by mouth daily with supper.)   metoprolol tartrate (LOPRESSOR) 25 MG tablet Take 1 tablet (25 mg total) by mouth 2 (two) times daily.   Multiple Vitamins-Minerals (MULTIVITAMIN WOMEN 50+) TABS Take 1 tablet by mouth daily.   pantoprazole (PROTONIX) 40 MG tablet Take 1 tablet (40 mg total) by mouth 2 (two) times daily.   potassium chloride SA (KLOR-CON M) 20 MEQ tablet Take 2 tablets (40 mEq total) by mouth 2 (two) times daily.   RELION PEN NEEDLES 31G X 6 MM MISC USE 1 PEN NEEDLE ONCE DAILY   rosuvastatin (CRESTOR) 10 MG tablet Take 1 tablet (10 mg total) by mouth daily. (Patient taking differently: Take 10 mg by mouth every other day.)   traZODone (DESYREL) 50 MG tablet Take 25 mg by mouth at bedtime.    Objective: BP  127/85   Pulse 99   Ht _0  (1.778 m)   Wt 274 lb (124.3 kg)   LMP  (LMP Unknown)   BMI 39.31 kg/m   Physical Exam:  General: Alert and oriented. and No acute distress. Gait: Left sided antalgic gait.  No obvious deformity.  Tenderness palpation along the lower back.  No tenderness palpation over the greater trochanter laterally of the left.  Negative straight leg raise bilaterally.  3+ patellar tendon reflexes bilaterally.  5/5 strength in bilateral lower extremities.  Sensation is intact in bilateral lower extremities currently.  IMAGING: I personally ordered and reviewed the following images  Standing lumbar spine x-rays were obtained in clinic today.  No acute injuries are noted.  Well-maintained disc heights.  No evidence of anterolisthesis.  There are some anterior osteophytes extending throughout the lumbar region.  Developing bridging osteophytes at several levels.  Impression: Lumbar spine with diffuse degenerative changes.   New Medications:  No orders of the defined types were placed in this encounter.     Mordecai Rasmussen, MD  11/25/2022 12:27 PM

## 2022-11-25 NOTE — Progress Notes (Deleted)
Patient ID: Sydney Taylor, female   DOB: 08/21/1966, 56 y.o.   MRN: [id number]           Reason for Appointment: Type II Diabetes follow-up   History of Present Illness   Diagnosis date: 2021  Previous history: She had mild hyperglycemia in 2020 but had significant increase in glucose of 484 in 3/21  Non-insulin hypoglycemic drugs previously used: Jardiance since 9/21, Bydureon, Trulicity, metformin, glipizide Insulin was started in 2022  A1c range in the last few years is: 6.4-12.7  Recent history:     Non-insulin hypoglycemic drugs: Jardiance 10 mg daily, metformin ER 1 g at dinner     Insulin regimen: Toujeo 150 units daily          Side effects from medications: Nausea and vomiting with Trulicity  Current self management, blood sugar patterns and problems identified:  A1c is 7.1 and improved She had better blood sugars with some tendency to low sugars a couple of months ago and she reduced her insulin dose on her own However at least in the last 2 weeks she has had some stress issues and is not able to watch her diet at meals and snacks and has some tendency to high sugars periodically in the afternoons or evenings and sometimes for a few hours at a time She has relatively good blood sugars early morning and no hypoglycemia She is currently not doing any exercise She has had no side effects with Jardiance, this has been prescribed by her PCP but has not been given more than 10 mg However she has difficulty taking metformin the morning and is only able to take this in the evening, currently taking 2 tablets; however in the last month she has had some diarrhea for other reasons She tries to try to have some protein at meals such as yogurt, beans, some eggs and tofu She said that previous dietitian did not adviser well on diet  Exercise: none  Diet management: Mealtimes are 7AM, 12N, and 6-7PM..  She is vegetarian     Hypoglycemia:  none    Currently using DEXCOM  G6  Blood sugars are generally lower early morning and then gradually increasing until late evening HYPERGLYCEMIC episodes are occurring sporadically after lunch or dinner No HYPOGLYCEMIA Overnight blood sugars are AVERAGING 171 at midnight and the lowest reading in the mornings 136 between 4-6 AM and no hypoglycemia PREMEAL readings are mildly increased at about 160 at lunchtime but about 170 at dinnertime Generally postprandial blood sugars are quite variable especially in the evenings and tend to be the highest between 5-7 PM with average 187  CGM use % of time 93  2-week average/GV 162/21  Time in range      74%  % Time Above 180 24+2  % Time above 250   % Time Below 70 0     Dietician visit: Most recent: A few years ago  Weight control:  Wt Readings from Last 3 Encounters:  11/04/22 271 lb (122.9 kg)  11/01/22 271 lb 6.4 oz (123.1 kg)  09/23/22 270 lb 3.2 oz (122.6 kg)            Diabetes labs:  Lab Results  Component Value Date   HGBA1C 7.1 (A) 08/20/2022   HGBA1C 8.0 (A) 03/12/2022   HGBA1C 8.5 (H) 12/14/2021   Lab Results  Component Value Date   MICROALBUR 1.1 12/18/2020   LDLCALC 68 12/14/2021   CREATININE 0.97 10/30/2022    No results   found for: "FRUCTOSAMINE"   Allergies as of 11/25/2022       Reactions   Cephalexin Itching, Swelling, Other (See Comments)        Medication List        Accurate as of November 25, 2022  9:22 AM. If you have any questions, ask your nurse or doctor.          Accu-Chek Guide test strip Generic drug: glucose blood 1 each by Other route in the morning, at noon, and at bedtime. Use as instructed   ALPRAZolam 0.5 MG tablet Commonly known as: XANAX Take 0.25 mg by mouth daily as needed for anxiety.   B-12 Methylcobalamin 1000 MCG Tbdp Take 1,000 mcg by mouth daily.   BC FAST PAIN RELIEF PO Take 1 packet by mouth daily as needed (pain).   blood glucose meter kit and supplies Kit Inject 1 each into the  skin daily. Check blood glucose once a day. Diagnosis code: E 11.9.   Bydureon BCise 2 MG/0.85ML Auij Generic drug: Exenatide ER Inject weekly   calcium carbonate 500 MG chewable tablet Commonly known as: TUMS - dosed in mg elemental calcium Chew 1,000 mg by mouth daily as needed for indigestion or heartburn.   chlorthalidone 25 MG tablet Commonly known as: HYGROTON Take 1/2 (one-half) tablet by mouth once daily   desloratadine 5 MG tablet Commonly known as: Clarinex Take 1 tablet (5 mg total) by mouth daily.   Dexcom G6 Sensor Misc by Does not apply route.   Dexcom G6 Transmitter Misc by Does not apply route.   dicyclomine 10 MG capsule Commonly known as: BENTYL Take 1 capsule (10 mg total) by mouth 4 (four) times daily -  before meals and at bedtime. For abdominal cramping and loose stool What changed: when to take this   DULoxetine 60 MG capsule Commonly known as: CYMBALTA Take 60 mg by mouth every morning.   empagliflozin 25 MG Tabs tablet Commonly known as: Jardiance Take 1 tablet (25 mg total) by mouth daily before breakfast.   ibuprofen 200 MG tablet Commonly known as: ADVIL Take 600 mg by mouth every 6 (six) hours as needed for moderate pain or headache.   L-Methylfolate 15 MG Tabs Take 15 mg by mouth daily at 6 (six) AM.   losartan 25 MG tablet Commonly known as: COZAAR Take 1 tablet by mouth once daily   metFORMIN 500 MG 24 hr tablet Commonly known as: GLUCOPHAGE-XR Take 2 tablets (1,000 mg total) by mouth daily with breakfast. What changed: when to take this   metoprolol tartrate 25 MG tablet Commonly known as: LOPRESSOR Take 1 tablet (25 mg total) by mouth 2 (two) times daily.   Multivitamin Women 50+ Tabs Take 1 tablet by mouth daily.   pantoprazole 40 MG tablet Commonly known as: PROTONIX Take 1 tablet (40 mg total) by mouth 2 (two) times daily.   Pen Needles 32G X 4 MM Misc Check BS 2 x a day   ReliOn Pen Needles 31G X 6 MM  Misc Generic drug: Insulin Pen Needle USE 1 PEN NEEDLE ONCE DAILY   potassium chloride SA 20 MEQ tablet Commonly known as: KLOR-CON M Take 2 tablets (40 mEq total) by mouth 2 (two) times daily.   rosuvastatin 10 MG tablet Commonly known as: CRESTOR Take 1 tablet (10 mg total) by mouth daily. What changed: when to take this   Toujeo Max SoloStar 300 UNIT/ML Solostar Pen Generic drug: insulin glargine (2 Unit Dial) Inject 170  Units into the skin every morning. And pen needles 1/day What changed: how much to take   traZODone 50 MG tablet Commonly known as: DESYREL Take 25 mg by mouth at bedtime.        Allergies:  Allergies  Allergen Reactions   Cephalexin Itching, Swelling and Other (See Comments)    Past Medical History:  Diagnosis Date   Anemia    Anxiety    Arrhythmia    Bipolar depression (HCC) 12/28/2019   Chronic fatigue    Chronic headaches    Depression    Diabetes mellitus without complication (HCC)    Fibromyalgia    GERD (gastroesophageal reflux disease)    High cholesterol    HLD (hyperlipidemia)    Hypertension    Insect bite    Migraine    Prediabetes    Sleep apnea    Urinary incontinence    Vasculitis (HCC)     Past Surgical History:  Procedure Laterality Date   BIOPSY  09/04/2022   Procedure: BIOPSY;  Surgeon: Rourk, Robert M, MD;  Location: [facility];  Service: Endoscopy;;   CHOLECYSTECTOMY N/A 03/15/2022   Procedure: LAPAROSCOPIC CHOLECYSTECTOMY WITH INTRAOPERATIVE CHOLANGIOGRAM;  Surgeon: Bridges, Lindsay C, MD;  Location: [facility];  Service: General;  Laterality: N/A;   CHOLECYSTECTOMY  02/03/2022   COLONOSCOPY  2017   per patient normal and needs next one in 2027 (Dr. Patel)   COLONOSCOPY WITH PROPOFOL N/A 09/04/2022   Procedure: COLONOSCOPY WITH PROPOFOL;  Surgeon: Rourk, Robert M, MD;  Location: [facility];  Service: Endoscopy;  Laterality: N/A;   ESOPHAGOGASTRODUODENOSCOPY N/A 05/12/2020   Procedure:  ESOPHAGOGASTRODUODENOSCOPY (EGD);  Surgeon: Rourk, Robert M, MD;  Location: [facility];  Service: Endoscopy;  Laterality: N/A;  11:15am   Joint fusion on foot Right    LIVER BIOPSY N/A 03/15/2022   Procedure: LIVER BIOPSY;  Surgeon: Bridges, Lindsay C, MD;  Location: [facility];  Service: General;  Laterality: N/A;   MALONEY DILATION N/A 05/12/2020   Procedure: MALONEY DILATION;  Surgeon: Rourk, Robert M, MD;  Location: [facility];  Service: Endoscopy;  Laterality: N/A;   NASAL SEPTUM SURGERY     POLYPECTOMY  09/04/2022   Procedure: POLYPECTOMY;  Surgeon: Rourk, Robert M, MD;  Location: [facility];  Service: Endoscopy;;   SINOSCOPY      Family History  Problem Relation Age of Onset   Hypertension Father    Hyperlipidemia Father    Diabetes Father    Stroke Father    Depression Father    Anxiety disorder Father    Hypertension Mother    Hyperlipidemia Mother    Diabetes Mellitus II Mother    Congestive Heart Failure Mother    Heart attack Mother    Hypertension Sister    Diabetes Sister    Hypertension Brother    Autoimmune disease Brother    Diabetes Maternal Grandmother    Lupus Paternal Grandmother    Depression Paternal Grandmother    Anxiety disorder Paternal Grandmother    Depression Maternal Aunt    Anxiety disorder Maternal Aunt    Colon cancer Neg Hx    Esophageal cancer Neg Hx    Gastric cancer Neg Hx     Social History:  reports that she quit smoking about 39 years ago. Her smoking use included cigarettes. She has never used smokeless tobacco. She reports that she does not currently use alcohol. She reports that she does not use drugs.  Review of Systems:    Last diabetic eye exam date 6/22  Last urine microalbumin date: 4/23  Last foot exam date: 10/22  Symptoms of neuropathy: None  Hypertension:   ACE/ARB medication: Losartan 25 mg daily  BP Readings from Last 3 Encounters:  11/01/22 121/83  09/23/22 120/82  09/04/22 (!) 90/48    Lipid  management: Rosuvastatin and colestipol    Lab Results  Component Value Date   CHOL 136 12/14/2021   CHOL 208 (H) 01/21/2020   CHOL 205 (A) 08/26/2016   Lab Results  Component Value Date   HDL 36 (L) 12/14/2021   HDL 40 (L) 01/21/2020   HDL 54 08/26/2016   Lab Results  Component Value Date   LDLCALC 68 12/14/2021   LDLCALC 133 (H) 01/21/2020   LDLCALC 122 08/26/2016   Lab Results  Component Value Date   TRIG 189 (H) 12/14/2021   TRIG 208 (H) 01/21/2020   TRIG 145 08/26/2016   Lab Results  Component Value Date   CHOLHDL 3.8 12/14/2021   CHOLHDL 5.2 (H) 01/21/2020   No results found for: "LDLDIRECT"   Fibrosis 4 Score = .57 (Low risk)        Interpretation for patients with NAFLD          <1.30       -  F0-F1 (Low risk)          1.30-2.67 -  Indeterminate           >2.67      -  F3-F4 (High risk)     Validated for ages 35-65      Examination:   LMP  (LMP Unknown)   There is no height or weight on file to calculate BMI.    ASSESSMENT/ PLAN:    Diabetes type 2 insulin requiring:   Current regimen: Insulin, Jardiance and metformin  See history of present illness for detailed discussion of current diabetes management, blood sugar patterns from her Dexcom download and problems identified  A1c is 7.1 Her GMI recently 7.2 but she has tendency to postprandial hyperglycemia not controlled with just taking basal insulin and low-dose Jardiance and metformin Recently is not able to watch her diet as well and has difficulty losing weight Discussed importance of weight loss for long-term reduction of insulin resistance and its complications as well as avoiding progression to further insulin deficiency She has had nausea with Trulicity previously but not Bydureon  Recommendations:  She will need to maximize efforts to lose weight Currently not on GLP-1 drugs and will benefit from this Since she has tried Bydureon without any nausea previously we will start her back on  2 mg weekly Discussed benefits of GLP-1 drugs modes of action, benefits of long-term weight loss and indirect benefits on cardiovascular risk and liver steatosis She does need to start an exercise program with some form of aerobic activity at home such as a YouTube video Increase Jardiance to 2 pills and next prescription will be for 25 mg daily for maximizing the benefit from this She will monitor blood pressure and let us know if it is going down significantly and may consider stopping losartan Consultation with diabetes educator/nutritionist for meal planning and general diabetes education Make sure she has some protein with every meal If morning sugars start getting below 100 she will reduce her insulin by 10 units Discussed postprandial blood sugar targets  HYPERTENSION: Well-controlled  Dyslipidemia: Lipids were at target on the last measurement in 12/22   There are no Patient Instructions on   file for this visit.  Total visit time including counseling = 30 minutes    11/25/2022, 9:22 AM  

## 2022-11-27 ENCOUNTER — Encounter: Payer: Self-pay | Admitting: Endocrinology

## 2022-11-29 ENCOUNTER — Other Ambulatory Visit: Payer: Self-pay | Admitting: *Deleted

## 2022-11-29 ENCOUNTER — Other Ambulatory Visit: Payer: Self-pay

## 2022-11-29 DIAGNOSIS — E1165 Type 2 diabetes mellitus with hyperglycemia: Secondary | ICD-10-CM

## 2022-11-29 DIAGNOSIS — M7989 Other specified soft tissue disorders: Secondary | ICD-10-CM

## 2022-11-29 MED ORDER — TOUJEO MAX SOLOSTAR 300 UNIT/ML ~~LOC~~ SOPN: 170.0000 [IU] | PEN_INJECTOR | SUBCUTANEOUS | 1 refills | Status: DC

## 2022-12-02 ENCOUNTER — Encounter: Payer: Self-pay | Admitting: Endocrinology

## 2022-12-04 ENCOUNTER — Ambulatory Visit: Payer: Medicare Other | Admitting: Endocrinology

## 2022-12-06 ENCOUNTER — Ambulatory Visit (HOSPITAL_COMMUNITY)
Admission: RE | Admit: 2022-12-06 | Discharge: 2022-12-06 | Disposition: A | Discharge status: Home / Self Care | Payer: Medicare Other | Source: Ambulatory Visit | Attending: Vascular Surgery | Admitting: Vascular Surgery

## 2022-12-06 ENCOUNTER — Encounter: Payer: Self-pay | Admitting: Vascular Surgery

## 2022-12-06 ENCOUNTER — Ambulatory Visit: Payer: Medicare Other | Admitting: Vascular Surgery

## 2022-12-06 VITALS — BP 124/84 | HR 88 | Temp 98.0°F | Resp 20 | Ht 70.0 in | Wt 272.0 lb

## 2022-12-06 DIAGNOSIS — I872 Venous insufficiency (chronic) (peripheral): Secondary | ICD-10-CM | POA: Diagnosis not present

## 2022-12-06 DIAGNOSIS — M7989 Other specified soft tissue disorders: Secondary | ICD-10-CM | POA: Insufficient documentation

## 2022-12-06 NOTE — Progress Notes (Unsigned)
  Office Note     CC: Bilateral lower extremity swelling with right thigh varicose vein Requesting Provider:  Patel, Rutwik K, MD  HPI: Sydney Taylor is a 56 y.o. (04/10/1966) female who presents at the request of Patel, Rutwik K, MD for evaluation of bilateral lower extremity swelling with right thigh varicose vein.  On exam today, Sydney Taylor was doing well.  Sydney Taylor has had a recent history of bilateral lower extremity swelling which waxes and wanes.  The most recent episode was a few weeks ago.  More concerning was a varicosity she appreciated on the anterior aspect of the right thigh.  The varicosity is not painful.  No episodes of bleeding or ulceration.  She notes that the varicosity can get larger when she is on her feet for most of the day, but is otherwise asymptomatic.  Sydney Taylor denies significant heavy, aching, tired feeling associated with bilateral lower extremity swelling.  The swelling improves with elevation.  She has not tried compression stockings.  No previous history of DVT no history of venous procedures.   Past Medical History:  Diagnosis Date   Anemia    Anxiety    Arrhythmia    Bipolar depression (HCC) 12/28/2019   Chronic fatigue    Chronic headaches    Depression    Diabetes mellitus without complication (HCC)    Fibromyalgia    GERD (gastroesophageal reflux disease)    High cholesterol    HLD (hyperlipidemia)    Hypertension    Insect bite    Migraine    Prediabetes    Sleep apnea    Urinary incontinence    Vasculitis (HCC)     Past Surgical History:  Procedure Laterality Date   BIOPSY  09/04/2022   Procedure: BIOPSY;  Surgeon: Rourk, Robert M, MD;  Location: [facility];  Service: Endoscopy;;   CHOLECYSTECTOMY N/A 03/15/2022   Procedure: LAPAROSCOPIC CHOLECYSTECTOMY WITH INTRAOPERATIVE CHOLANGIOGRAM;  Surgeon: Bridges, Lindsay C, MD;  Location: [facility];  Service: General;  Laterality: N/A;   CHOLECYSTECTOMY  02/03/2022   COLONOSCOPY  2017    per patient normal and needs next one in 2027 (Dr. Patel)   COLONOSCOPY WITH PROPOFOL N/A 09/04/2022   Procedure: COLONOSCOPY WITH PROPOFOL;  Surgeon: Rourk, Robert M, MD;  Location: [facility];  Service: Endoscopy;  Laterality: N/A;   ESOPHAGOGASTRODUODENOSCOPY N/A 05/12/2020   Procedure: ESOPHAGOGASTRODUODENOSCOPY (EGD);  Surgeon: Rourk, Robert M, MD;  Location: [facility];  Service: Endoscopy;  Laterality: N/A;  11:15am   Joint fusion on foot Right    LIVER BIOPSY N/A 03/15/2022   Procedure: LIVER BIOPSY;  Surgeon: Bridges, Lindsay C, MD;  Location: [facility];  Service: General;  Laterality: N/A;   MALONEY DILATION N/A 05/12/2020   Procedure: MALONEY DILATION;  Surgeon: Rourk, Robert M, MD;  Location: [facility];  Service: Endoscopy;  Laterality: N/A;   NASAL SEPTUM SURGERY     POLYPECTOMY  09/04/2022   Procedure: POLYPECTOMY;  Surgeon: Rourk, Robert M, MD;  Location: [facility];  Service: Endoscopy;;   SINOSCOPY      Social History   Socioeconomic History   Marital status: Married    Spouse name: Not on file   Number of children: 0   Years of education: Not on file   Highest education level: Bachelor's degree (e.g., BA, AB, BS)  Occupational History   Occupation: research associate/drug development  Tobacco Use   Smoking status: Former    Types: Cigarettes    Quit   date: 12/30/1982    Years since quitting: 39.9   Smokeless tobacco: Never  Vaping Use   Vaping Use: Never used  Substance and Sexual Activity   Alcohol use: Not Currently    Alcohol/week: 0.0 standard drinks of alcohol   Drug use: Never   Sexual activity: Yes    Birth control/protection: Post-menopausal  Other Topics Concern   Not on file  Social History Narrative   Married for 3 years.Lives with husband.Bachelors in Biology.   Right handed   Caffeine: 1-2 per day    Social Determinants of Health   Financial Resource Strain: Low Risk  (06/04/2022)   Overall Financial Resource Strain (CARDIA)     Difficulty of Paying Living Expenses: Not hard at all  Food Insecurity: No Food Insecurity (06/04/2022)   Hunger Vital Sign    Worried About Running Out of Food in the Last Year: Never true    Ran Out of Food in the Last Year: Never true  Transportation Needs: No Transportation Needs (06/04/2022)   PRAPARE - Transportation    Lack of Transportation (Medical): No    Lack of Transportation (Non-Medical): No  Physical Activity: Inactive (06/04/2022)   Exercise Vital Sign    Days of Exercise per Week: 0 days    Minutes of Exercise per Session: 0 min  Stress: No Stress Concern Present (06/04/2022)   [employer] - Occupational Stress Questionnaire    Feeling of Stress : Not at all  Social Connections: Moderately Integrated (06/04/2022)   Social Connection and Isolation Panel [NHANES]    Frequency of Communication with Friends and Family: More than three times a week    Frequency of Social Gatherings with Friends and Family: More than three times a week    Attends Religious Services: More than 4 times per year    Active Member of Clubs or Organizations: No    Attends Club or Organization Meetings: Never    Marital Status: Married  Intimate Partner Violence: Not At Risk (06/04/2022)   Humiliation, Afraid, Rape, and Kick questionnaire    Fear of Current or Ex-Partner: No    Emotionally Abused: No    Physically Abused: No    Sexually Abused: No   Family History  Problem Relation Age of Onset   Hypertension Father    Hyperlipidemia Father    Diabetes Father    Stroke Father    Depression Father    Anxiety disorder Father    Hypertension Mother    Hyperlipidemia Mother    Diabetes Mellitus II Mother    Congestive Heart Failure Mother    Heart attack Mother    Hypertension Sister    Diabetes Sister    Hypertension Brother    Autoimmune disease Brother    Diabetes Maternal Grandmother    Lupus Paternal Grandmother    Depression Paternal Grandmother    Anxiety  disorder Paternal Grandmother    Depression Maternal Aunt    Anxiety disorder Maternal Aunt    Colon cancer Neg Hx    Esophageal cancer Neg Hx    Gastric cancer Neg Hx     Current Outpatient Medications  Medication Sig Dispense Refill   ALPRAZolam (XANAX) 0.5 MG tablet Take 0.25 mg by mouth daily as needed for anxiety.     Aspirin-Caffeine (BC FAST PAIN RELIEF PO) Take 1 packet by mouth daily as needed (pain).     B-12 Methylcobalamin 1000 MCG TBDP Take 1,000 mcg by mouth daily.     blood   glucose meter kit and supplies KIT Inject 1 each into the skin daily. Check blood glucose once a day. Diagnosis code: E 11.9. 1 each 0   calcium carbonate (TUMS - DOSED IN MG ELEMENTAL CALCIUM) 500 MG chewable tablet Chew 1,000 mg by mouth daily as needed for indigestion or heartburn.     chlorthalidone (HYGROTON) 25 MG tablet Take 1/2 (one-half) tablet by mouth once daily 45 tablet 0   Continuous Blood Gluc Sensor (DEXCOM G6 SENSOR) MISC by Does not apply route.     Continuous Blood Gluc Transmit (DEXCOM G6 TRANSMITTER) MISC by Does not apply route.     dicyclomine (BENTYL) 10 MG capsule Take 1 capsule (10 mg total) by mouth 4 (four) times daily -  before meals and at bedtime. For abdominal cramping and loose stool (Patient taking differently: Take 10 mg by mouth daily. For abdominal cramping and loose stool) 120 capsule 1   DULoxetine (CYMBALTA) 60 MG capsule Take 60 mg by mouth every morning.     empagliflozin (JARDIANCE) 25 MG TABS tablet Take 1 tablet (25 mg total) by mouth daily before breakfast. 30 tablet 5   Exenatide ER (BYDUREON BCISE) 2 MG/0.85ML AUIJ Inject weekly 3.4 mL 3   glucose blood (ACCU-CHEK GUIDE) test strip 1 each by Other route in the morning, at noon, and at bedtime. Use as instructed 300 each 3   ibuprofen (ADVIL) 200 MG tablet Take 600 mg by mouth every 6 (six) hours as needed for moderate pain or headache.     insulin glargine, 2 Unit Dial, (TOUJEO MAX SOLOSTAR) 300 UNIT/ML  Solostar Pen Inject 170 Units into the skin every morning. And pen needles 1/day 51 mL 1   L-Methylfolate 15 MG TABS Take 15 mg by mouth daily at 6 (six) AM.     losartan (COZAAR) 25 MG tablet Take 1 tablet by mouth once daily 90 tablet 0   metFORMIN (GLUCOPHAGE-XR) 500 MG 24 hr tablet Take 2 tablets (1,000 mg total) by mouth daily with breakfast. (Patient taking differently: Take 1,000 mg by mouth daily with supper.) 180 tablet 3   Multiple Vitamins-Minerals (MULTIVITAMIN WOMEN 50+) TABS Take 1 tablet by mouth daily.     pantoprazole (PROTONIX) 40 MG tablet Take 1 tablet (40 mg total) by mouth 2 (two) times daily. 60 tablet 3   potassium chloride SA (KLOR-CON M) 20 MEQ tablet Take 2 tablets (40 mEq total) by mouth 2 (two) times daily. 720 tablet 0   RELION PEN NEEDLES 31G X 6 MM MISC USE 1 PEN NEEDLE ONCE DAILY 50 each 0   rosuvastatin (CRESTOR) 10 MG tablet Take 1 tablet (10 mg total) by mouth daily. (Patient taking differently: Take 10 mg by mouth every other day.) 30 tablet 11   traZODone (DESYREL) 50 MG tablet Take 25 mg by mouth at bedtime.     desloratadine (CLARINEX) 5 MG tablet Take 1 tablet (5 mg total) by mouth daily. (Patient not taking: Reported on 12/06/2022) 30 tablet 2   Insulin Pen Needle (PEN NEEDLES) 32G X 4 MM MISC Check BS 2 x a day (Patient not taking: Reported on 12/06/2022) 100 each 5   metoprolol tartrate (LOPRESSOR) 25 MG tablet Take 1 tablet (25 mg total) by mouth 2 (two) times daily. 180 tablet 3   No current facility-administered medications for this visit.    Allergies  Allergen Reactions   Cephalexin Itching, Swelling and Other (See Comments)     REVIEW OF SYSTEMS:  [X] denotes positive finding, [ ]  denotes negative finding Cardiac  Comments:  Chest pain or chest pressure:    Shortness of breath upon exertion:    Short of breath when lying flat:    Irregular heart rhythm:        Vascular    Pain in calf, thigh, or hip brought on by ambulation:    Pain in  feet at night that wakes you up from your sleep:     Blood clot in your veins:    Leg swelling:         Pulmonary    Oxygen at home:    Productive cough:     Wheezing:         Neurologic    Sudden weakness in arms or legs:     Sudden numbness in arms or legs:     Sudden onset of difficulty speaking or slurred speech:    Temporary loss of vision in one eye:     Problems with dizziness:         Gastrointestinal    Blood in stool:     Vomited blood:         Genitourinary    Burning when urinating:     Blood in urine:        Psychiatric    Major depression:         Hematologic    Bleeding problems:    Problems with blood clotting too easily:        Skin    Rashes or ulcers:        Constitutional    Fever or chills:      PHYSICAL EXAMINATION:  Vitals:   12/06/22 1218  BP: 124/84  Pulse: 88  Resp: 20  Temp: 98 F (36.7 C)  SpO2: 94%  Weight: 272 lb (123.4 kg)  Height: 5' 10" (1.778 m)    General:  WDWN in NAD; vital signs documented above Gait: Not observed HENT: WNL, normocephalic Pulmonary: normal non-labored breathing , without Rales, rhonchi,  wheezing Cardiac: regular HR Abdomen: soft, NT, no masses Skin: without rashes Vascular Exam/Pulses:  Right Left  Radial 2+ (normal) 2+ (normal)  Ulnar    Femoral    Popliteal    DP 2+ (normal) 2+ (normal)  PT     Extremities: no ischemic changes, without Gangrene , without cellulitis; without open wounds;  Musculoskeletal: no muscle wasting or atrophy  Neurologic: A&O X 3;  No focal weakness or paresthesias are detected Psychiatric:  The pt has Normal affect.   Non-Invasive Vascular Imaging:    Venous Reflux Times  +--------------+---------+------+-----------+------------+-------------+  RIGHT        Reflux NoRefluxReflux TimeDiameter cmsComments                               Yes                                         +--------------+---------+------+-----------+------------+-------------+  CFV                    yes   >1 second                            +--------------+---------+------+-----------+------------+-------------+  FV mid        no                                                   +--------------+---------+------+-----------+------------+-------------+  Popliteal    no                                                   +--------------+---------+------+-----------+------------+-------------+  GSV at [facility]              yes    >500 ms      0.76                   +--------------+---------+------+-----------+------------+-------------+  GSV prox thighno                            0.46                   +--------------+---------+------+-----------+------------+-------------+  GSV mid thigh           yes                 0.41                   +--------------+---------+------+-----------+------------+-------------+  GSV dist thigh          yes                 0.48                   +--------------+---------+------+-----------+------------+-------------+  GSV at knee             yes                 0.30                   +--------------+---------+------+-----------+------------+-------------+  GSV prox calf                                       out of fascia  +--------------+---------+------+-----------+------------+-------------+  SSV Pop Fossa no                            0.13                   +--------------+---------+------+-----------+------------+-------------+  SSV prox calf no                            0.13                   +--------------+---------+------+-----------+------------+-------------+  AASV         no                            0.43                   +--------------+---------+------+-----------+------------+-------------+     ASSESSMENT/PLAN:: 56 y.o. female presenting with asymptomatic  bilateral lower extremity swelling, with a focal varicosity on the anterior aspect of the right thigh.   Duplex ultrasound reviewed demonstrating reflux within the greater saphenous vein in the right leg.  On physical exam, a small amount of edema was appreciated, however there were no significant skin changes, or varicosities appreciated.  The only varicosity appreciated was on the anterior aspect of the right thigh.  I had  a long conversation with Anderson Malta regarding venous insufficiency.  Ether would benefit from wearing compression stockings on a daily basis and effort to decrease edema and improve venous return. We also discussed the importance of weight loss.  Jessey's greater saphenous vein appears to be amenable to ablation should she become more symptomatic.  She was measured for compression stockings in our office today.  She was asked to call if any questions or concerns arise, or or new symptoms develop.   Broadus John, MD [facility] [phone]

## 2022-12-10 ENCOUNTER — Other Ambulatory Visit: Payer: Self-pay | Admitting: Endocrinology

## 2022-12-13 ENCOUNTER — Encounter: Payer: Self-pay | Admitting: Internal Medicine

## 2022-12-16 ENCOUNTER — Encounter: Payer: Self-pay | Admitting: Endocrinology

## 2022-12-16 ENCOUNTER — Telehealth (INDEPENDENT_AMBULATORY_CARE_PROVIDER_SITE_OTHER): Payer: Medicare Other | Admitting: Endocrinology

## 2022-12-16 DIAGNOSIS — E1165 Type 2 diabetes mellitus with hyperglycemia: Secondary | ICD-10-CM | POA: Diagnosis not present

## 2022-12-16 DIAGNOSIS — Z794 Long term (current) use of insulin: Secondary | ICD-10-CM | POA: Diagnosis not present

## 2022-12-16 DIAGNOSIS — E7800 Pure hypercholesterolemia, unspecified: Secondary | ICD-10-CM | POA: Diagnosis not present

## 2022-12-16 NOTE — Progress Notes (Signed)
Patient ID: Sydney Taylor, female   DOB: 08-17-1966, 56 y.o.   MRN: [id number]           Reason for Appointment: Type II Diabetes follow-up   I connected with the above-named patient by video enabled telemedicine application and verified that I am speaking with the correct person. The patient was explained the limitations of evaluation and management by telemedicine and the availability of in person appointments.  Patient also understood that there may be a patient responsible charge related to this service  Location of the patient: Patient's home  Location of the provider: Physician office Only the patient and myself were participating in the encounter The patient understood the above statements and agreed to proceed.  History of Present Illness   Diagnosis date: 2021  Previous history: She had mild hyperglycemia in 2020 but had significant increase in glucose of 484 in 3/21  Non-insulin hypoglycemic drugs previously used: Jardiance since 8/98, Bydureon, Trulicity, metformin, glipizide Insulin was started in 2022  A1c range in the last few years is: 6.4-12.7  Recent history:     Non-insulin hypoglycemic drugs: Jardiance 25 mg daily, metformin ER 1 g at dinner, Bydureon 2 mg weekly     Insulin regimen: Toujeo 150 units daily          Side effects from medications: Nausea and vomiting with Trulicity  Current self management, blood sugar patterns and problems identified:  A1c is 7.1 as of October, improved She has had better blood sugar control with adding Bydureon in August; previously had nausea with Trulicity However she did not follow-up since then Also taking 25 mg Jardiance  She thinks her blood sugar was starting to get low early morning occasionally, as low as 55 once but since then has not been low at all Overnight blood sugars recently are averaging around 140 Lyumjev sensor Has not been able to reduce her insulin also  She thinks that recently because of snacks  or sweets and types of foods she has not been able to lose weight This is despite a session with the nutritionist; she is a vegetarian Trying to do a little walking recently She tries to try to have some protein at meals such as yogurt, beans, some eggs and tofu Currently postprandial readings are usually fairly good but may occasionally have a significant rise in the afternoon, rarely after breakfast  Diet management: Mealtimes are 7AM, 12N, and 6-7PM..  She is vegetarian     Hypoglycemia:  none    Currently using DEXCOM G6  Blood sugars are fairly evenly controlled OVERNIGHT averaging about 140 and no hypoglycemia  Overall average blood sugar the rest of the day before meals is also about 140-160 range  POSTPRANDIAL hyperglycemia is occurring sporadically after breakfast and periodically in the mid afternoon  However blood sugars tend to be the lowest after about 10 PM through 2 AM  No hypoglycemia   CGM use % of time   2-week average/GV 154, GMI 7  Time in range 85     % was 74  % Time Above 180 14  % Time above 250   % Time Below 70 0      Previously data:  CGM use % of time 93  2-week average/GV 162/21  Time in range      74%  % Time Above 180 24+2  % Time above 250   % Time Below 70 0     Dietician visit: Most recent: 11/23  Weight  control:  Wt Readings from Last 3 Encounters:  12/06/22 272 lb (123.4 kg)  11/25/22 274 lb (124.3 kg)  11/04/22 271 lb (122.9 kg)            Diabetes labs:  Lab Results  Component Value Date   HGBA1C 7.1 (A) 08/20/2022   HGBA1C 8.0 (A) 03/12/2022   HGBA1C 8.5 (H) 12/14/2021   Lab Results  Component Value Date   MICROALBUR 1.1 12/18/2020   LDLCALC 68 12/14/2021   CREATININE 0.97 10/30/2022    No results found for: "FRUCTOSAMINE"   Allergies as of 12/16/2022       Reactions   Cephalexin Itching, Swelling, Other (See Comments)        Medication List        Accurate as of December 16, 2022  1:01 PM. If you  have any questions, ask your nurse or doctor.          Accu-Chek Guide test strip Generic drug: glucose blood 1 each by Other route in the morning, at noon, and at bedtime. Use as instructed   ALPRAZolam 0.5 MG tablet Commonly known as: XANAX Take 0.25 mg by mouth daily as needed for anxiety.   B-12 Methylcobalamin 1000 MCG Tbdp Take 1,000 mcg by mouth daily.   BC FAST PAIN RELIEF PO Take 1 packet by mouth daily as needed (pain).   blood glucose meter kit and supplies Kit Inject 1 each into the skin daily. Check blood glucose once a day. Diagnosis code: E 11.9.   Bydureon BCise 2 MG/0.85ML Auij Generic drug: Exenatide ER INJECT 2 MG SUBCUTANEOUSLY ONCE A WEEK   calcium carbonate 500 MG chewable tablet Commonly known as: TUMS - dosed in mg elemental calcium Chew 1,000 mg by mouth daily as needed for indigestion or heartburn.   chlorthalidone 25 MG tablet Commonly known as: HYGROTON Take 1/2 (one-half) tablet by mouth once daily   desloratadine 5 MG tablet Commonly known as: Clarinex Take 1 tablet (5 mg total) by mouth daily.   Dexcom G6 Sensor Misc by Does not apply route.   Dexcom G6 Transmitter Misc by Does not apply route.   dicyclomine 10 MG capsule Commonly known as: BENTYL Take 1 capsule (10 mg total) by mouth 4 (four) times daily -  before meals and at bedtime. For abdominal cramping and loose stool What changed: when to take this   DULoxetine 60 MG capsule Commonly known as: CYMBALTA Take 60 mg by mouth every morning.   empagliflozin 25 MG Tabs tablet Commonly known as: Jardiance Take 1 tablet (25 mg total) by mouth daily before breakfast.   ibuprofen 200 MG tablet Commonly known as: ADVIL Take 600 mg by mouth every 6 (six) hours as needed for moderate pain or headache.   L-Methylfolate 15 MG Tabs Take 15 mg by mouth daily at 6 (six) AM.   losartan 25 MG tablet Commonly known as: COZAAR Take 1 tablet by mouth once daily   metFORMIN 500 MG 24  hr tablet Commonly known as: GLUCOPHAGE-XR Take 2 tablets (1,000 mg total) by mouth daily with breakfast. What changed: when to take this   metoprolol tartrate 25 MG tablet Commonly known as: LOPRESSOR Take 1 tablet (25 mg total) by mouth 2 (two) times daily.   Multivitamin Women 50+ Tabs Take 1 tablet by mouth daily.   pantoprazole 40 MG tablet Commonly known as: PROTONIX Take 1 tablet (40 mg total) by mouth 2 (two) times daily.   Pen Needles 32G X 4  MM Misc Check BS 2 x a day   ReliOn Pen Needles 31G X 6 MM Misc Generic drug: Insulin Pen Needle USE 1 PEN NEEDLE ONCE DAILY   potassium chloride SA 20 MEQ tablet Commonly known as: KLOR-CON M Take 2 tablets (40 mEq total) by mouth 2 (two) times daily.   rosuvastatin 10 MG tablet Commonly known as: CRESTOR Take 1 tablet (10 mg total) by mouth daily. What changed: when to take this   Toujeo Max SoloStar 300 UNIT/ML Solostar Pen Generic drug: insulin glargine (2 Unit Dial) Inject 170 Units into the skin every morning. And pen needles 1/day   traZODone 50 MG tablet Commonly known as: DESYREL Take 25 mg by mouth at bedtime.        Allergies:  Allergies  Allergen Reactions   Cephalexin Itching, Swelling and Other (See Comments)    Past Medical History:  Diagnosis Date   Anemia    Anxiety    Arrhythmia    Bipolar depression ([place]) 12/28/2019   Chronic fatigue    Chronic headaches    Depression    Diabetes mellitus without complication (HCC)    Fibromyalgia    GERD (gastroesophageal reflux disease)    High cholesterol    HLD (hyperlipidemia)    Hypertension    Insect bite    Migraine    Prediabetes    Sleep apnea    Urinary incontinence    Vasculitis ([place])     Past Surgical History:  Procedure Laterality Date   BIOPSY  09/04/2022   Procedure: BIOPSY;  Surgeon: Daneil Dolin, MD;  [facility]: [facility];  Service: Endoscopy;;   CHOLECYSTECTOMY N/A 03/15/2022   Procedure: LAPAROSCOPIC  CHOLECYSTECTOMY WITH INTRAOPERATIVE CHOLANGIOGRAM;  Surgeon: Virl Cagey, MD;  Location: [facility];  Service: General;  Laterality: N/A;   CHOLECYSTECTOMY  02/03/2022   COLONOSCOPY  2017   per patient normal and needs next one in 2027 (Dr. Posey Pronto)   COLONOSCOPY WITH PROPOFOL N/A 09/04/2022   Procedure: COLONOSCOPY WITH PROPOFOL;  Surgeon: Daneil Dolin, MD;  [facility]: [facility];  Service: Endoscopy;  Laterality: N/A;   ESOPHAGOGASTRODUODENOSCOPY N/A 05/12/2020   Procedure: ESOPHAGOGASTRODUODENOSCOPY (EGD);  Surgeon: Daneil Dolin, MD;  [facility]: [facility];  Service: Endoscopy;  Laterality: N/A;  11:15am   Joint fusion on foot Right    LIVER BIOPSY N/A 03/15/2022   Procedure: LIVER BIOPSY;  Surgeon: Virl Cagey, MD;  Location: [facility];  Service: General;  Laterality: N/A;   MALONEY DILATION N/A 05/12/2020   Procedure: Keturah Shavers;  Surgeon: Daneil Dolin, MD;  [facility]: [facility];  Service: Endoscopy;  Laterality: N/A;   NASAL SEPTUM SURGERY     POLYPECTOMY  09/04/2022   Procedure: POLYPECTOMY;  Surgeon: Daneil Dolin, MD;  [facility]: [facility];  Service: Endoscopy;;   SINOSCOPY      Family History  Problem Relation Age of Onset   Hypertension Father    Hyperlipidemia Father    Diabetes Father    Stroke Father    Depression Father    Anxiety disorder Father    Hypertension Mother    Hyperlipidemia Mother    Diabetes Mellitus II Mother    Congestive Heart Failure Mother    Heart attack Mother    Hypertension Sister    Diabetes Sister    Hypertension Brother    Autoimmune disease Brother    Diabetes Maternal Grandmother    Lupus Paternal Grandmother    Depression Paternal  Grandmother    Anxiety disorder Paternal Grandmother    Depression Maternal Aunt    Anxiety disorder Maternal Aunt    Colon cancer Neg Hx    Esophageal cancer Neg Hx    Gastric cancer Neg Hx     Social History:  reports that she quit smoking about 39 years ago. Her  smoking use included cigarettes. She has never used smokeless tobacco. She reports that she does not currently use alcohol. She reports that she does not use drugs.  Review of Systems:  Last diabetic eye exam date 6/22  Last urine microalbumin date: 4/23  Last foot exam date: 10/22  Symptoms of neuropathy: None  Hypertension:   ACE/ARB medication: Losartan 25 mg daily  BP Readings from Last 3 Encounters:  12/06/22 124/84  11/25/22 127/85  11/01/22 121/83   Also on Jardiance 25 mg, renal function history:  Lab Results  Component Value Date   CREATININE 0.97 10/30/2022   CREATININE 1.01 (H) 07/30/2022   CREATININE 1.03 03/29/2022     Lipid management: Managed by PCP: Taking rosuvastatin and colestipol    Lab Results  Component Value Date   CHOL 136 12/14/2021   CHOL 208 (H) 01/21/2020   CHOL 205 (A) 08/26/2016   Lab Results  Component Value Date   HDL 36 (L) 12/14/2021   HDL 40 (L) 01/21/2020   HDL 54 08/26/2016   Lab Results  Component Value Date   LDLCALC 68 12/14/2021   LDLCALC 133 (H) 01/21/2020   LDLCALC 122 08/26/2016   Lab Results  Component Value Date   TRIG 189 (H) 12/14/2021   TRIG 208 (H) 01/21/2020   TRIG 145 08/26/2016   Lab Results  Component Value Date   CHOLHDL 3.8 12/14/2021   CHOLHDL 5.2 (H) 01/21/2020   No results found for: "LDLDIRECT"   Fibrosis 4 Score = .57 (Low risk)        Interpretation for patients with NAFLD          <1.30       -  F0-F1 (Low risk)          1.30-2.67 -  Indeterminate           >2.67      -  F3-F4 (High risk)     Validated for ages 55-65      Examination:   LMP  (LMP Unknown)   There is no height or weight on file to calculate BMI.    ASSESSMENT/ PLAN:    Diabetes type 2 insulin requiring:   Current regimen: 150 units Toujeo, Jardiance, Bydureon and metformin  See history of present illness for detailed discussion of current diabetes management, blood sugar patterns from her Dexcom download  and problems identified  A1c Last 7.1 Her GMI recently is 7  Weight loss has still been fairly difficult Blood sugar control is reasonably good with GMI 7% on her Dexcom and time in target 85 She has just started working with a dietitian last month but unable to maintain the diet this month because of holidays  Recommendations:  She will continue to work with the dietitian to achieve better diet No change in insulin or Bydureon. However if she starts getting readings below 90 fasting she can cut down 5 units weekly on her insulin dose Will review her level of control and weight on the next visit but may consider a trial of Mounjaro to achieve better weight loss and reduced insulin requirement She will continue to increase her  walking as tolerated Stay on Jardiance She can have her A1c and labs checked with PCP at her upcoming visit in about 6 weeks  Dyslipidemia: Lipids were at target on the last measurement in 12/22, will need follow-up    There are no Patient Instructions on file for this visit.  Total visit time including counseling = 30 minutes  Elayne Snare 12/16/2022, 1:01 PM

## 2022-12-17 ENCOUNTER — Encounter: Payer: Self-pay | Admitting: Endocrinology

## 2022-12-19 ENCOUNTER — Ambulatory Visit: Payer: Medicare Other | Admitting: Dietician

## 2022-12-26 ENCOUNTER — Ambulatory Visit (HOSPITAL_COMMUNITY): Payer: Medicare Other | Admitting: Physical Therapy

## 2022-12-31 ENCOUNTER — Ambulatory Visit: Payer: Medicare Other | Admitting: Dietician

## 2023-01-02 ENCOUNTER — Other Ambulatory Visit: Payer: Self-pay | Admitting: Endocrinology

## 2023-01-06 ENCOUNTER — Other Ambulatory Visit: Payer: Self-pay | Admitting: Endocrinology

## 2023-01-09 ENCOUNTER — Encounter: Payer: Self-pay | Admitting: Internal Medicine

## 2023-01-10 ENCOUNTER — Other Ambulatory Visit: Payer: Self-pay | Admitting: Internal Medicine

## 2023-01-10 DIAGNOSIS — M7989 Other specified soft tissue disorders: Secondary | ICD-10-CM

## 2023-01-14 ENCOUNTER — Ambulatory Visit (HOSPITAL_COMMUNITY): Payer: Medicare Other | Admitting: Physical Therapy

## 2023-01-14 ENCOUNTER — Other Ambulatory Visit: Payer: Self-pay | Admitting: Endocrinology

## 2023-01-22 ENCOUNTER — Other Ambulatory Visit: Payer: Self-pay | Admitting: Internal Medicine

## 2023-01-27 ENCOUNTER — Encounter: Payer: Self-pay | Admitting: Physical Medicine and Rehabilitation

## 2023-01-27 ENCOUNTER — Encounter
Payer: Medicare Other | Attending: Physical Medicine and Rehabilitation | Admitting: Physical Medicine and Rehabilitation

## 2023-01-27 ENCOUNTER — Other Ambulatory Visit: Payer: Self-pay | Admitting: Endocrinology

## 2023-01-27 VITALS — BP 122/82 | HR 98 | Ht 70.0 in | Wt 278.0 lb

## 2023-01-27 DIAGNOSIS — M797 Fibromyalgia: Secondary | ICD-10-CM | POA: Insufficient documentation

## 2023-01-27 DIAGNOSIS — G8929 Other chronic pain: Secondary | ICD-10-CM | POA: Insufficient documentation

## 2023-01-27 DIAGNOSIS — M7918 Myalgia, other site: Secondary | ICD-10-CM

## 2023-01-27 DIAGNOSIS — R5382 Chronic fatigue, unspecified: Secondary | ICD-10-CM | POA: Diagnosis present

## 2023-01-27 DIAGNOSIS — M549 Dorsalgia, unspecified: Secondary | ICD-10-CM | POA: Diagnosis present

## 2023-01-27 MED ORDER — GABAPENTIN 300 MG PO CAPS: 300.0000 mg | ORAL_CAPSULE | Freq: Every day | ORAL | 5 refills | Status: DC

## 2023-01-27 MED ORDER — LIDOCAINE 5 % EX OINT: 1.0000 | TOPICAL_OINTMENT | Freq: Four times a day (QID) | CUTANEOUS | 5 refills | Status: DC | PRN

## 2023-01-27 NOTE — Patient Instructions (Signed)
Pt is a 57 yr old female with hx of fibromyalgia- since 1985, myofascial pain and chronic low back pain who's being followed by Dr Amedeo Kinsman, ortho for low back- also has B/L knee DJD- BMI 39.89 currently; also OSA- uses [facility] - is compliant.   Sent here by Rheumatology for evaluation of FMS/myofascial pain.    Suggest taking Duloxetine  120 mg at night- more bang for your buck at once- can cause sleepiness, so suggest at night.   2.  Suggest an exercise ball-  to sit on for 30 minutes- up to 60 minutes- and sit upright- don't slump- ~$10 at store- knees are at 90 degrees- blow it up based on knees.    3.  Suggest calling PCP and asking if there's any imaging that might be appropriate for mid thoracic- bra strap level pain in back and radiating to abdomen- suggest seeing if PCP thinks COULD be intra-abdominal? Last CT scans looked for for this issue as of 02/24/21.   4. Voltaren gel-  over the counter- apply up up to 4 locations- is anti-inflammatory - can take WITH an anti-inflammatory-   5. Lidocaine ointment 5%- thin layer- can put on like icing a cake- and cover with saran wrap- during 30 minutes-   6. Theracane- or [employer] [place] [employer]- can get at [employer]- hold pressure 2-4 minutes on each spot that hurts- not massaging- or can use massage gun- - but harder to get to back.    7.  Takes Trazodone-  for sleep- cna hold if too sleepy with thsi AND gabapentin.    8. Gabapentin 300 mg nightly x 1 week, then can increase to 600 mg nightly- 1 hour before bed- only increase if doing well.    9. Think about Low dose Naltrexone- also think about Keppra and Trileptal.    10.  At next visit, look at trigger point injections- put on wait list.    11. F/U in 8 weeks and wait list-

## 2023-01-27 NOTE — Progress Notes (Signed)
Subjective:    Patient ID: Sydney Taylor, female    DOB: [birth year], 57 y.o.   MRN: [id number]  HPI   Pt is a 57 yr old female with hx of fibromyalgia- since 1985, myofascial pain and chronic low back pain who's being followed by Dr Amedeo Kinsman, ortho for low back- also has B/L knee DJD- BMI 39.89 currently; also OSA- uses CPA - is compliant.   Sent here by Rheumatology for evaluation of FMS/myofascial pain.   Ortho- last seen 11/23- and therapy advised- waiting on MRI.  Last xray showed lumbar spine diffuse DJD.    Put off PT- starting next week- so supposed to do for 6 weeks, then decide if needs an MRI.   Along with FMS, have depression/anxiety and chronic fatigue syndrome Sx's.  Has had more chronic fatigue issues  Also had depression/admitted to [facility] health unit for awhile. During COVID.     Got disability in 2021- for depression, FMS, etc  Become inactive, due to pain/depression, etc and gained more weight.  Don't know how to get out of it.   Has gained ~ 20 lbs in last 6-8 months.  Combo of diet and lack of exercise.    Muscular pain in upper back- like giant rubber band- radiating to T7 area- in entire circle all the way around her. For last 2 weeks or so.  Not going away.  Feels like gripping pain- as well as sharp pain- can feel stabbing/and like popped a rubber band.     Psychiatry started Provigil 100 mg daily- mildly- moderately helpful. Started 3 days ago.   Exercise-  The only walking is around her property-  2 acres- has barn/chicken coop Or at store.  No stretching.  Had joined a gym- was going 2x/week husband dropped out- and keeps wanting to go- then fatigue kicks in.   On: duloxetine 60 mg BID-  Provigil 100 mg daily Xanax 0.25 mg on rare occasions- ~1x/month.    Tried: Gabapentin- moderately effective- extremely sedating- ~ 2008- multiple times per day.  Lyrica- same way- so sedating- also dizzy due to new psych medicine-  (was  [place]).         Pain Inventory Average Pain 5 Pain Right Now 6 My pain is constant, sharp, and stiffness,soarness  In the last 24 hours, has pain interfered with the following? General activity 5 Relation with others 8 Enjoyment of life 7 What TIME of day is your pain at its worst? morning  Sleep (in general) Fair  Pain is worse with: inactivity and standing Pain improves with: rest Relief from Meds:  .  walk without assistance how many minutes can you walk? 5 ability to climb steps?  yes do you drive?  yes Do you have any goals in this area?  yes  disabled: date disabled 10/2017 I need assistance with the following:  household duties and shopping  bladder control problems numbness trouble walking dizziness confusion depression anxiety suicidal thoughts  Any changes since last visit?  no  Any changes since last visit?  no    Family History  Problem Relation Age of Onset   Hypertension Father    Hyperlipidemia Father    Diabetes Father    Stroke Father    Depression Father    Anxiety disorder Father    Hypertension Mother    Hyperlipidemia Mother    Diabetes Mellitus II Mother    Congestive Heart Failure Mother    Heart attack Mother  Hypertension Sister    Diabetes Sister    Hypertension Brother    Autoimmune disease Brother    Diabetes Maternal Grandmother    Lupus Paternal Grandmother    Depression Paternal Grandmother    Anxiety disorder Paternal Grandmother    Depression Maternal Aunt    Anxiety disorder Maternal Aunt    Colon cancer Neg Hx    Esophageal cancer Neg Hx    Gastric cancer Neg Hx    Social History   Socioeconomic History   Marital status: Married    Spouse name: Not on file   Number of children: 0   Years of education: Not on file   Highest education level: Bachelor's degree (e.g., BA, AB, BS)  Occupational History   Occupation: research associate/drug development  Tobacco Use   Smoking status: Former    Types:  Cigarettes    Quit date: 12/30/1982    Years since quitting: 40.1   Smokeless tobacco: Never  Vaping Use   Vaping Use: Never used  Substance and Sexual Activity   Alcohol use: Not Currently    Alcohol/week: 0.0 standard drinks of alcohol   Drug use: Never   Sexual activity: Yes    Birth control/protection: Post-menopausal  Other Topics Concern   Not on file  Social History Narrative   Married for 3 years.Lives with husband.Bachelors in Education officer, museum.   Right handed   Caffeine: 1-2 per day    Social Determinants of Health   Financial Resource Strain: Low Risk  (06/04/2022)   Overall Financial Resource Strain (CARDIA)    Difficulty of Paying Living Expenses: Not hard at all  Food Insecurity: No Food Insecurity (06/04/2022)   Hunger Vital Sign    Worried About Running Out of Food in the Last Year: Never true    Ran Out of Food in the Last Year: Never true  Transportation Needs: No Transportation Needs (06/04/2022)   PRAPARE - Hydrologist (Medical): No    Lack of Transportation (Non-Medical): No  Physical Activity: Inactive (06/04/2022)   Exercise Vital Sign    Days of Exercise per Week: 0 days    Minutes of Exercise per Session: 0 min  Stress: No Stress Concern Present (06/04/2022)   [place]    Feeling of Stress : Not at all  Social Connections: Moderately Integrated (06/04/2022)   Social Connection and Isolation Panel [NHANES]    Frequency of Communication with Friends and Family: More than three times a week    Frequency of Social Gatherings with Friends and Family: More than three times a week    Attends Religious Services: More than 4 times per year    Active Member of [employer] or Organizations: No    Attends Archivist Meetings: Never    Marital Status: Married   Past Surgical History:  Procedure Laterality Date   BIOPSY  09/04/2022   Procedure: BIOPSY;  Surgeon: Daneil Dolin,  MD;  Location: [facility];  Service: Endoscopy;;   CHOLECYSTECTOMY N/A 03/15/2022   Procedure: LAPAROSCOPIC CHOLECYSTECTOMY WITH INTRAOPERATIVE CHOLANGIOGRAM;  Surgeon: Virl Cagey, MD;  Location: [facility];  Service: General;  Laterality: N/A;   CHOLECYSTECTOMY  02/03/2022   COLONOSCOPY  2017   per patient normal and needs next one in 2027 (Dr. Posey Pronto)   COLONOSCOPY WITH PROPOFOL N/A 09/04/2022   Procedure: COLONOSCOPY WITH PROPOFOL;  Surgeon: Daneil Dolin, MD;  Location: [facility];  Service: Endoscopy;  Laterality: N/A;   ESOPHAGOGASTRODUODENOSCOPY N/A 05/12/2020   Procedure: ESOPHAGOGASTRODUODENOSCOPY (EGD);  Surgeon: Daneil Dolin, MD;  Location: [facility];  Service: Endoscopy;  Laterality: N/A;  11:15am   Joint fusion on foot Right    LIVER BIOPSY N/A 03/15/2022   Procedure: LIVER BIOPSY;  Surgeon: Virl Cagey, MD;  Location: [facility];  Service: General;  Laterality: N/A;   MALONEY DILATION N/A 05/12/2020   Procedure: Keturah Shavers;  Surgeon: Daneil Dolin, MD;  Location: [facility];  Service: Endoscopy;  Laterality: N/A;   NASAL SEPTUM SURGERY     POLYPECTOMY  09/04/2022   Procedure: POLYPECTOMY;  Surgeon: Daneil Dolin, MD;  Location: [facility];  Service: Endoscopy;;   SINOSCOPY     Past Medical History:  Diagnosis Date   Anemia    Anxiety    Arrhythmia    Bipolar depression ([place]) 12/28/2019   Chronic fatigue    Chronic headaches    Depression    Diabetes mellitus without complication (HCC)    Fibromyalgia    GERD (gastroesophageal reflux disease)    High cholesterol    HLD (hyperlipidemia)    Hypertension    Insect bite    Migraine    Prediabetes    Sleep apnea    Urinary incontinence    Vasculitis (HCC)    BP 122/82   Pulse 98   Ht '5\' 10"'$  (1.778 m)   Wt 278 lb (126.1 kg)   LMP  (LMP Unknown)   SpO2 93%   BMI 39.89 kg/m   Opioid Risk Score:   Fall Risk Score:  `1  Depression screen PHQ 2/9     01/27/2023    1:19 PM  11/04/2022    3:32 PM 10/15/2022   10:52 AM 10/07/2022   10:08 AM 09/25/2022    2:50 PM 06/28/2022    9:55 AM 06/04/2022   12:17 PM  Depression screen PHQ 2/9  Decreased Interest 2 0 0 0 0 0 0  Down, Depressed, Hopeless '2 1 1 1 1 '$ 0 0  PHQ - 2 Score '4 1 1 1 1 '$ 0 0  Altered sleeping 2  0 0  0 0  Tired, decreased energy 3  0 0  0 0  Change in appetite 3  0 0  0 0  Feeling bad or failure about yourself  1  0 0  0 0  Trouble concentrating 3  0 0  0 0  Moving slowly or fidgety/restless 0  0 0  0 0  Suicidal thoughts 1  0 0  0 0  PHQ-9 Score '17  1 1  '$ 0 0  Difficult doing work/chores Very difficult  Not difficult at all Not difficult at all  Not difficult at all Not difficult at all      Review of Systems  Musculoskeletal:  Positive for back pain, gait problem and neck pain.  All other systems reviewed and are negative.     Objective:   Physical Exam Awake, alert, appropriate, slightly flat affect; drinking water, NAD TTP over trigger points in upper neck/back, mid and low back and pecs-  Also in medial knees, anterior tibialis; hands and forearms B/L  MS: 5/5 in UE and LE's B/L - tested deltoids, biceps, trieps, WE< grip and FA B/L as well as HF, KE, KF DF and PF.         Assessment & Plan:   Pt is a 57 yr old female with hx of fibromyalgia-  since 1985, myofascial pain and chronic low back pain who's being followed by Dr Amedeo Kinsman, ortho for low back- also has B/L knee DJD- BMI 39.89 currently; also OSA- uses CPA - is compliant.   Sent here by Rheumatology for evaluation of FMS/myofascial pain.    Suggest taking Duloxetine  120 mg at night- more bang for your buck at once- can cause sleepiness, so suggest at night.   2.  Suggest an exercise ball-  to sit on for 30 minutes- up to 60 minutes- and sit upright- don't slump- ~$10 at store- knees are at 90 degrees- blow it up based on knees.    3.  Suggest calling PCP and asking if there's any imaging that might be appropriate for mid  thoracic- bra strap level pain in back and radiating to abdomen- suggest seeing if PCP thinks COULD be intra-abdominal? Last CT scans looked for for this issue as of 02/24/21.   4. Voltaren gel-  over the counter- apply up up to 4 locations- is anti-inflammatory - can take WITH an anti-inflammatory-   5. Lidocaine ointment 5%- thin layer- can put on like icing a cake- and cover with saran wrap- during 30 minutes-   6. Theracane- or [employer]- can get at [employer]- hold pressure 2-4 minutes on each spot that hurts- not massaging- or can use massage gun- - but harder to get to back.    7.  Takes Trazodone-  for sleep- cna hold if too sleepy with thsi AND gabapentin.    8. Gabapentin 300 mg nightly x 1 week, then can increase to 600 mg nightly- 1 hour before bed- only increase if doing well.    9. Think about Low dose Naltrexone- also think about Keppra and Trileptal.    10.  At next visit, look at trigger point injections- put on wait list.    11. F/U in 8 weeks and wait list-    I spent a total of  62  minutes on total care today- >50% coordination of care- due to education on TrP injections- as well as discussed options for nerve pain/FMS; also my concern about Thoracic pain

## 2023-01-30 ENCOUNTER — Ambulatory Visit (INDEPENDENT_AMBULATORY_CARE_PROVIDER_SITE_OTHER): Payer: Medicare Other | Admitting: Internal Medicine

## 2023-01-30 ENCOUNTER — Encounter: Payer: Self-pay | Admitting: Internal Medicine

## 2023-01-30 VITALS — BP 127/82 | HR 98 | Ht 70.0 in | Wt 274.2 lb

## 2023-01-30 DIAGNOSIS — E1142 Type 2 diabetes mellitus with diabetic polyneuropathy: Secondary | ICD-10-CM

## 2023-01-30 DIAGNOSIS — M546 Pain in thoracic spine: Secondary | ICD-10-CM

## 2023-01-30 DIAGNOSIS — E559 Vitamin D deficiency, unspecified: Secondary | ICD-10-CM | POA: Diagnosis not present

## 2023-01-30 DIAGNOSIS — E782 Mixed hyperlipidemia: Secondary | ICD-10-CM | POA: Diagnosis not present

## 2023-01-30 DIAGNOSIS — I10 Essential (primary) hypertension: Secondary | ICD-10-CM

## 2023-01-30 DIAGNOSIS — Z794 Long term (current) use of insulin: Secondary | ICD-10-CM

## 2023-01-30 MED ORDER — CELECOXIB 100 MG PO CAPS: 100.0000 mg | ORAL_CAPSULE | Freq: Every day | ORAL | 2 refills | Status: DC | PRN

## 2023-01-30 MED ORDER — CYCLOBENZAPRINE HCL 5 MG PO TABS: 5.0000 mg | ORAL_TABLET | Freq: Two times a day (BID) | ORAL | 1 refills | Status: DC | PRN

## 2023-01-30 NOTE — Assessment & Plan Note (Addendum)
Likely due to DDD of thoracic spine Could be muscular strain as well Added Celebrex as needed for back pain, avoid daily use as she has GERD Flexeril as needed for muscle spasms Continue gabapentin and Cymbalta If persistent, will get imaging and/or PT

## 2023-01-30 NOTE — Progress Notes (Signed)
Acute Office Visit  Subjective:    Patient ID: Sydney Taylor, female    DOB: 11-08-1966, 57 y.o.   MRN: [id number]  Chief Complaint  Patient presents with   Back Pain    Patient has had back pain that moves to her sides and stomach, been going on for about two weeks    HPI Patient is in today for upper back pain, radiating to the right side and RUQ abdominal area for the last 2 weeks, since 01/18.  Her pain is constant, worse with sideways movement and better with sitting.  She denies any recent injury or fall.  She has chronic diabetic neuropathy, but denies any urinary or stool incontinence.  She is currently on gabapentin and Cymbalta for neuropathy, fibromyalgia and chronic fatigue.  She denies any nausea, vomiting or change in bowel habits recently.  Past Medical History:  Diagnosis Date   Anemia    Anxiety    Arrhythmia    Bipolar depression ([place]) 12/28/2019   Chronic fatigue    Chronic headaches    Depression    Diabetes mellitus without complication (HCC)    Fibromyalgia    GERD (gastroesophageal reflux disease)    High cholesterol    HLD (hyperlipidemia)    Hypertension    Insect bite    Migraine    Prediabetes    Sleep apnea    Urinary incontinence    Vasculitis ([place])     Past Surgical History:  Procedure Laterality Date   BIOPSY  09/04/2022   Procedure: BIOPSY;  Surgeon: Daneil Dolin, MD;  [facility]: [facility];  Service: Endoscopy;;   CHOLECYSTECTOMY N/A 03/15/2022   Procedure: LAPAROSCOPIC CHOLECYSTECTOMY WITH INTRAOPERATIVE CHOLANGIOGRAM;  Surgeon: Virl Cagey, MD;  Location: [facility];  Service: General;  Laterality: N/A;   CHOLECYSTECTOMY  02/03/2022   COLONOSCOPY  2017   per patient normal and needs next one in 2027 (Dr. Posey Pronto)   COLONOSCOPY WITH PROPOFOL N/A 09/04/2022   Procedure: COLONOSCOPY WITH PROPOFOL;  Surgeon: Daneil Dolin, MD;  [facility]: [facility];  Service: Endoscopy;  Laterality: N/A;   ESOPHAGOGASTRODUODENOSCOPY N/A  05/12/2020   Procedure: ESOPHAGOGASTRODUODENOSCOPY (EGD);  Surgeon: Daneil Dolin, MD;  [facility];  Service: Endoscopy;  Laterality: N/A;  11:15am   Joint fusion on foot Right    LIVER BIOPSY N/A 03/15/2022   Procedure: LIVER BIOPSY;  Surgeon: Virl Cagey, MD;  Location: [facility];  Service: General;  Laterality: N/A;   MALONEY DILATION N/A 05/12/2020   Procedure: Keturah Shavers;  Surgeon: Daneil Dolin, MD;  [facility];  Service: Endoscopy;  Laterality: N/A;   NASAL SEPTUM SURGERY     POLYPECTOMY  09/04/2022   Procedure: POLYPECTOMY;  Surgeon: Daneil Dolin, MD;  [facility]: [facility];  Service: Endoscopy;;   SINOSCOPY      Family History  Problem Relation Age of Onset   Hypertension Father    Hyperlipidemia Father    Diabetes Father    Stroke Father    Depression Father    Anxiety disorder Father    Hypertension Mother    Hyperlipidemia Mother    Diabetes Mellitus II Mother    Congestive Heart Failure Mother    Heart attack Mother    Hypertension Sister    Diabetes Sister    Hypertension Brother    Autoimmune disease Brother    Diabetes Maternal Grandmother    Lupus Paternal Grandmother    Depression Paternal Grandmother  Anxiety disorder Paternal Grandmother    Depression Maternal Aunt    Anxiety disorder Maternal Aunt    Colon cancer Neg Hx    Esophageal cancer Neg Hx    Gastric cancer Neg Hx     Social History   Socioeconomic History   Marital status: Married    Spouse name: Not on file   Number of children: 0   Years of education: Not on file   Highest education level: Bachelor's degree (e.g., BA, AB, BS)  Occupational History   Occupation: research associate/drug development  Tobacco Use   Smoking status: Former    Types: Cigarettes    Quit date: 12/30/1982    Years since quitting: 40.1   Smokeless tobacco: Never  Vaping Use   Vaping Use: Never used  Substance and Sexual Activity   Alcohol use: Not Currently     Alcohol/week: 0.0 standard drinks of alcohol   Drug use: Never   Sexual activity: Yes    Birth control/protection: Post-menopausal  Other Topics Concern   Not on file  Social History Narrative   Married for 3 years.Lives with husband.Bachelors in Education officer, museum.   Right handed   Caffeine: 1-2 per day    Social Determinants of Health   Financial Resource Strain: Low Risk  (06/04/2022)   Overall Financial Resource Strain (CARDIA)    Difficulty of Paying Living Expenses: Not hard at all  Food Insecurity: No Food Insecurity (06/04/2022)   Hunger Vital Sign    Worried About Running Out of Food in the Last Year: Never true    Ran Out of Food in the Last Year: Never true  Transportation Needs: No Transportation Needs (06/04/2022)   PRAPARE - Hydrologist (Medical): No    Lack of Transportation (Non-Medical): No  Physical Activity: Inactive (06/04/2022)   Exercise Vital Sign    Days of Exercise per Week: 0 days    Minutes of Exercise per Session: 0 min  Stress: No Stress Concern Present (06/04/2022)   [place]    Feeling of Stress : Not at all  Social Connections: Moderately Integrated (06/04/2022)   Social Connection and Isolation Panel [NHANES]    Frequency of Communication with Friends and Family: More than three times a week    Frequency of Social Gatherings with Friends and Family: More than three times a week    Attends Religious Services: More than 4 times per year    Active Member of [employer] or Organizations: No    Attends Archivist Meetings: Never    Marital Status: Married  Human resources officer Violence: Not At Risk (06/04/2022)   Humiliation, Afraid, Rape, and Kick questionnaire    Fear of Current or Ex-Partner: No    Emotionally Abused: No    Physically Abused: No    Sexually Abused: No    Outpatient Medications Prior to Visit  Medication Sig Dispense Refill   ALPRAZolam (XANAX)  0.5 MG tablet Take 0.25 mg by mouth daily as needed for anxiety.     Aspirin-Caffeine (BC FAST PAIN RELIEF PO) Take 1 packet by mouth daily as needed (pain). (Patient not taking: Reported on 01/27/2023)     B-12 Methylcobalamin 1000 MCG TBDP Take 1,000 mcg by mouth daily.     blood glucose meter kit and supplies KIT Inject 1 each into the skin daily. Check blood glucose once a day. Diagnosis code: E 11.9. 1 each 0   calcium carbonate (TUMS -  DOSED IN MG ELEMENTAL CALCIUM) 500 MG chewable tablet Chew 1,000 mg by mouth daily as needed for indigestion or heartburn.     chlorthalidone (HYGROTON) 25 MG tablet Take 1/2 (one-half) tablet by mouth once daily 45 tablet 0   Continuous Blood Gluc Sensor (DEXCOM G6 SENSOR) MISC by Does not apply route.     Continuous Blood Gluc Transmit (DEXCOM G6 TRANSMITTER) MISC by Does not apply route.     desloratadine (CLARINEX) 5 MG tablet Take 1 tablet (5 mg total) by mouth daily. 30 tablet 2   dicyclomine (BENTYL) 10 MG capsule Take 1 capsule (10 mg total) by mouth 4 (four) times daily -  before meals and at bedtime. For abdominal cramping and loose stool (Patient taking differently: Take 10 mg by mouth daily. For abdominal cramping and loose stool) 120 capsule 1   DULoxetine (CYMBALTA) 60 MG capsule Take 60 mg by mouth every morning.     empagliflozin (JARDIANCE) 25 MG TABS tablet TAKE 1 TABLET BY MOUTH ONCE DAILY BEFORE BREAKFAST 30 tablet 3   Exenatide ER (BYDUREON BCISE) 2 MG/0.85ML AUIJ INJECT 2 MG SUBCUTANEOUSLY ONCE A WEEK 4 mL 2   gabapentin (NEURONTIN) 300 MG capsule Take 1 capsule (300 mg total) by mouth at bedtime. X 1 week then can increase to 600 mg QHS if possible- for FMS 60 capsule 5   glucose blood (ACCU-CHEK GUIDE) test strip 1 each by Other route in the morning, at noon, and at bedtime. Use as instructed 300 each 3   insulin glargine, 2 Unit Dial, (TOUJEO MAX SOLOSTAR) 300 UNIT/ML Solostar Pen Inject 170 Units into the skin every morning. And pen  needles 1/day 51 mL 1   Insulin Pen Needle (PEN NEEDLES) 32G X 4 MM MISC Check BS 2 x a day 100 each 5   L-Methylfolate 15 MG TABS Take 15 mg by mouth daily at 6 (six) AM.     lidocaine (XYLOCAINE) 5 % ointment Apply 1 Application topically 4 (four) times daily as needed. 50 g 5   losartan (COZAAR) 25 MG tablet Take 1 tablet by mouth once daily 90 tablet 0   metFORMIN (GLUCOPHAGE-XR) 500 MG 24 hr tablet Take 2 tablets (1,000 mg total) by mouth daily with breakfast. (Patient taking differently: Take 1,000 mg by mouth daily with supper.) 180 tablet 3   metoprolol tartrate (LOPRESSOR) 25 MG tablet Take 1 tablet (25 mg total) by mouth 2 (two) times daily. 180 tablet 3   modafinil (PROVIGIL) 100 MG tablet Take 100 mg by mouth every morning.     Multiple Vitamins-Minerals (MULTIVITAMIN WOMEN 50+) TABS Take 1 tablet by mouth daily.     pantoprazole (PROTONIX) 40 MG tablet Take 1 tablet (40 mg total) by mouth 2 (two) times daily. 60 tablet 3   potassium chloride SA (KLOR-CON M) 20 MEQ tablet Take 2 tablets (40 mEq total) by mouth 2 (two) times daily. 720 tablet 0   RELION PEN NEEDLES 31G X 6 MM MISC USE 1 PEN NEEDLE ONCE A DAY. 50 each 0   rosuvastatin (CRESTOR) 10 MG tablet Take 1 tablet (10 mg total) by mouth daily. (Patient taking differently: Take 10 mg by mouth every other day.) 30 tablet 11   traZODone (DESYREL) 50 MG tablet Take 25 mg by mouth at bedtime.     ibuprofen (ADVIL) 200 MG tablet Take 600 mg by mouth every 6 (six) hours as needed for moderate pain or headache.     No facility-administered medications prior to  visit.    Allergies  Allergen Reactions   Cephalexin Itching, Swelling and Other (See Comments)    Review of Systems  Constitutional:  Negative for chills and fever.  HENT:  Positive for sinus pressure. Negative for ear discharge.   Respiratory:  Negative for cough, shortness of breath and wheezing.   Cardiovascular:  Negative for chest pain and palpitations.   Gastrointestinal:  Negative for diarrhea and vomiting.  Genitourinary:  Negative for dysuria and hematuria.  Musculoskeletal:  Positive for back pain. Negative for neck pain and neck stiffness.  Skin:  Positive for rash.  Neurological:  Positive for headaches. Negative for dizziness and weakness.  Psychiatric/Behavioral:  Negative for agitation and behavioral problems.        Objective:    Physical Exam Constitutional:      General: She is not in acute distress.    Appearance: She is obese. She is not diaphoretic.  HENT:     Head: Normocephalic and atraumatic.     Mouth/Throat:     Mouth: Mucous membranes are dry.     Pharynx: No posterior oropharyngeal erythema.  Eyes:     General: No scleral icterus.    Extraocular Movements: Extraocular movements intact.  Cardiovascular:     Rate and Rhythm: Normal rate and regular rhythm.     Pulses: Normal pulses.     Heart sounds: Normal heart sounds. No murmur heard. Pulmonary:     Breath sounds: Normal breath sounds. No wheezing or rales.  Musculoskeletal:     Cervical back: Neck supple. No tenderness.     Thoracic back: Tenderness present. Decreased range of motion.     Right lower leg: Edema (Trace) present.     Left lower leg: Edema (Trace) present.  Skin:    General: Skin is dry.     Coloration: Skin is not pale.     Findings: Rash (Multiple depigmented areas - circular/oval over b/l thigh and axilla, right leg) present.  Neurological:     General: No focal deficit present.     Mental Status: She is alert and oriented to person, place, and time.  Psychiatric:        Mood and Affect: Mood is depressed. Affect is flat.        Behavior: Behavior normal.        Thought Content: Thought content normal.     BP 127/82 (BP Location: Right Arm, Patient Position: Sitting, Cuff Size: Large)   Pulse 98   Ht '5\' 10"'$  (1.778 m)   Wt 274 lb 3.2 oz (124.4 kg)   LMP  (LMP Unknown)   SpO2 94%   BMI 39.34 kg/m  Wt Readings from Last 3  Encounters:  01/30/23 274 lb 3.2 oz (124.4 kg)  01/27/23 278 lb (126.1 kg)  12/06/22 272 lb (123.4 kg)        Assessment & Plan:   Problem List Items Addressed This Visit       Cardiovascular and Mediastinum   Essential hypertension    BP Readings from Last 1 Encounters:  01/30/23 127/82  Well-controlled with losartan 25 mg QD, Chlorthalidone 12.5 mg QD and Metoprolol 25 mg BID Counseled for compliance with the medications Advised DASH diet and moderate exercise/walking, at least 150 mins/week      Relevant Orders   TSH   CMP14+EGFR   CBC with Differential/Platelet     Endocrine   Type 2 diabetes mellitus ([place])     Lab Results  Component Value Date  HGBA1C 7.1 (A) 08/20/2022  Improved now On Jardiance and Bydureon On metformin XR and Toujeo 150 units nightly Followed by endocrinology Advised to follow diabetic diet On ARB and statin F/u CMP and lipid panel Diabetic eye exam: Advised to follow up with Ophthalmology for diabetic eye exam      Relevant Orders   Hemoglobin A1c   CMP14+EGFR     Other   Hyperlipidemia    On Crestor      Relevant Orders   Lipid panel   Acute right-sided thoracic back pain - Primary    Likely due to DDD of thoracic spine Could be muscular strain as well Added Celebrex as needed for back pain, avoid daily use as she has GERD Flexeril as needed for muscle spasms Continue gabapentin and Cymbalta If persistent, will get imaging and/or PT      Relevant Medications   celecoxib (CELEBREX) 100 MG capsule   cyclobenzaprine (FLEXERIL) 5 MG tablet   Other Visit Diagnoses     Vitamin D deficiency       Relevant Orders   VITAMIN D 25 Hydroxy (Vit-D Deficiency, Fractures)        Meds ordered this encounter  Medications   celecoxib (CELEBREX) 100 MG capsule    Sig: Take 1 capsule (100 mg total) by mouth daily as needed.    Dispense:  30 capsule    Refill:  2   cyclobenzaprine (FLEXERIL) 5 MG tablet    Sig: Take 1 tablet  (5 mg total) by mouth 2 (two) times daily between meals as needed for muscle spasms.    Dispense:  30 tablet    Refill:  1     Vani Gunner Keith Rake, MD

## 2023-01-30 NOTE — Assessment & Plan Note (Signed)
On Crestor

## 2023-01-30 NOTE — Addendum Note (Signed)
Addended byIhor Dow on: 01/30/2023 03:45 PM   Modules accepted: Level of Service

## 2023-01-30 NOTE — Assessment & Plan Note (Addendum)
BP Readings from Last 1 Encounters:  01/30/23 127/82   Well-controlled with losartan 25 mg QD, Chlorthalidone 12.5 mg QD and Metoprolol 25 mg BID Counseled for compliance with the medications Advised DASH diet and moderate exercise/walking, at least 150 mins/week

## 2023-01-30 NOTE — Assessment & Plan Note (Signed)
  Lab Results  Component Value Date   HGBA1C 7.1 (A) 08/20/2022   Improved now On Jardiance and Bydureon On metformin XR and Toujeo 150 units nightly Followed by endocrinology Advised to follow diabetic diet On ARB and statin F/u CMP and lipid panel Diabetic eye exam: Advised to follow up with Ophthalmology for diabetic eye exam

## 2023-01-30 NOTE — Patient Instructions (Signed)
Please start taking Celebrex as needed for back pain/discomfort.  Please take Flexeril as needed for muscle stiffness/spasms.  Please continue taking Gabapentin as prescribed.

## 2023-01-31 ENCOUNTER — Encounter: Payer: Self-pay | Admitting: Internal Medicine

## 2023-02-01 LAB — LIPID PANEL
Chol/HDL Ratio: 4.3 ratio (ref 0.0–4.4)
Cholesterol, Total: 160 mg/dL (ref 100–199)
HDL: 37 mg/dL — ABNORMAL LOW (ref >39)
LDL Chol Calc (NIH): 77 mg/dL (ref 0–99)
Triglycerides: 281 mg/dL — ABNORMAL HIGH (ref 0–149)
VLDL Cholesterol Cal: 46 mg/dL — ABNORMAL HIGH (ref 5–40)

## 2023-02-01 LAB — CMP14+EGFR
ALT: 24 IU/L (ref 0–32)
AST: 23 IU/L (ref 0–40)
Albumin/Globulin Ratio: 2.2 (ref 1.2–2.2)
Albumin: 4.3 g/dL (ref 3.8–4.9)
Alkaline Phosphatase: 95 IU/L (ref 44–121)
BUN/Creatinine Ratio: 15 (ref 9–23)
BUN: 15 mg/dL (ref 6–24)
Bilirubin Total: 0.3 mg/dL (ref 0.0–1.2)
CO2: 23 mmol/L (ref 20–29)
Calcium: 9.7 mg/dL (ref 8.7–10.2)
Chloride: 102 mmol/L (ref 96–106)
Creatinine, Ser: 0.98 mg/dL (ref 0.57–1.00)
Globulin, Total: 2 g/dL (ref 1.5–4.5)
Glucose: 156 mg/dL — ABNORMAL HIGH (ref 70–99)
Potassium: 4.6 mmol/L (ref 3.5–5.2)
Sodium: 143 mmol/L (ref 134–144)
Total Protein: 6.3 g/dL (ref 6.0–8.5)
eGFR: 67 mL/min/{1.73_m2} (ref >59)

## 2023-02-01 LAB — CBC WITH DIFFERENTIAL/PLATELET
Basophils Absolute: 0.1 10*3/uL (ref 0.0–0.2)
Basos: 1 %
EOS (ABSOLUTE): 0.5 10*3/uL — ABNORMAL HIGH (ref 0.0–0.4)
Eos: 5 %
Hematocrit: 44.2 % (ref 34.0–46.6)
Hemoglobin: 14 g/dL (ref 11.1–15.9)
Immature Grans (Abs): 0.1 10*3/uL (ref 0.0–0.1)
Immature Granulocytes: 1 %
Lymphocytes Absolute: 2.3 10*3/uL (ref 0.7–3.1)
Lymphs: 22 %
MCH: 28.4 pg (ref 26.6–33.0)
MCHC: 31.7 g/dL (ref 31.5–35.7)
MCV: 90 fL (ref 79–97)
Monocytes Absolute: 0.7 10*3/uL (ref 0.1–0.9)
Monocytes: 7 %
Neutrophils Absolute: 6.5 10*3/uL (ref 1.4–7.0)
Neutrophils: 64 %
Platelets: 349 10*3/uL (ref 150–450)
RBC: 4.93 x10E6/uL (ref 3.77–5.28)
RDW: 14.2 % (ref 11.7–15.4)
WBC: 10.2 10*3/uL (ref 3.4–10.8)

## 2023-02-01 LAB — VITAMIN D 25 HYDROXY (VIT D DEFICIENCY, FRACTURES): Vit D, 25-Hydroxy: 42.3 ng/mL (ref 30.0–100.0)

## 2023-02-01 LAB — HEMOGLOBIN A1C
Est. average glucose Bld gHb Est-mCnc: 143 mg/dL
Hgb A1c MFr Bld: 6.6 % — ABNORMAL HIGH (ref 4.8–5.6)

## 2023-02-01 LAB — TSH: TSH: 1.52 u[IU]/mL (ref 0.450–4.500)

## 2023-02-04 ENCOUNTER — Ambulatory Visit (INDEPENDENT_AMBULATORY_CARE_PROVIDER_SITE_OTHER): Payer: Medicare Other | Admitting: Family Medicine

## 2023-02-04 ENCOUNTER — Ambulatory Visit: Payer: Medicare Other | Admitting: Pulmonary Disease

## 2023-02-04 ENCOUNTER — Encounter: Payer: Medicare Other | Admitting: Internal Medicine

## 2023-02-04 ENCOUNTER — Encounter: Payer: Self-pay | Admitting: Family Medicine

## 2023-02-04 ENCOUNTER — Ambulatory Visit (HOSPITAL_COMMUNITY)
Admission: RE | Admit: 2023-02-04 | Discharge: 2023-02-04 | Disposition: A | Discharge status: Home / Self Care | Payer: Medicare Other | Source: Ambulatory Visit | Attending: Family Medicine | Admitting: Family Medicine

## 2023-02-04 VITALS — BP 130/72 | HR 102 | Ht 70.0 in | Wt 275.1 lb

## 2023-02-04 DIAGNOSIS — R051 Acute cough: Secondary | ICD-10-CM

## 2023-02-04 DIAGNOSIS — R079 Chest pain, unspecified: Secondary | ICD-10-CM | POA: Diagnosis present

## 2023-02-04 DIAGNOSIS — R9431 Abnormal electrocardiogram [ECG] [EKG]: Secondary | ICD-10-CM

## 2023-02-04 DIAGNOSIS — R1013 Epigastric pain: Secondary | ICD-10-CM

## 2023-02-04 MED ORDER — SUCRALFATE 1 GM/10ML PO SUSP: 1.0000 g | Freq: Two times a day (BID) | ORAL | 0 refills | Status: DC

## 2023-02-04 NOTE — Patient Instructions (Addendum)
Follow-up with Dr. Posey Pronto in 1 week reevaluate chest pain call if you need to be seen sooner.  EKG in office today.  Chest x-ray today.  Swab will be sent for COVID testing as well as RSV and influenza testing today.  Please increase the gabapentin as per recommended to 2 at bedtime to see if this will help with pain.   You are being referred to Dr. Gala Romney for evaluation of your abdominal pain.I have added carafate short term for your abdominal pain  If pain symptoms worsen, please go to the eD for evaluation  Thanks for choosing [facility], we consider it a privelige to serve you.

## 2023-02-04 NOTE — Progress Notes (Signed)
   Sydney Taylor     MRN: [id number]      DOB: 1966-03-09   HPI Ms. Ey is herereporting worsening of her lower thoracic pain which wraps around anterior chest both sides , any movemet takes pain from 3 to 8 pain worse today, lower sternal pain with very deep breath, no fever , no chills Cough, stuffy nose since 02/04 spouse  also hads respiratory symptoms  ROS Denies recent fever or chills.  Denies  palpitations and leg swelling Denies abdominal pain, nausea, vomiting,diarrhea or constipation.   Denies dysuria, frequency, hesitancy or incontinence. . Denies skin break down or rash.   PE  BP 130/72 (BP Location: Right Arm, Patient Position: Sitting, Cuff Size: Large)   Pulse (!) 102   Ht '5\' 10"'$  (1.778 m)   Wt 275 lb 1.3 oz (124.8 kg)   LMP  (LMP Unknown)   SpO2 94%   BMI 39.47 kg/m   Patient alert and oriented and in no cardiopulmonary distress.  HEENT: No facial asymmetry, EOMI,     Neck supple .  Chest: Clear to auscultation bilaterally.Chest pain identified in dermatomal pattern on lower thoracic spine around t 10/11 level.no rash present  CVS: S1, S2 no murmurs, no S3.Regular rate. EKG in office reported abnormal  ABD: Soft epigastric tenderness on palpation   Ext: No edema  MS: Adequate ROM spine, shoulders, hips and knees.  Skin: Intact, no ulcerations or rash noted.  Psych: Good eye contact, normal affect. Memory intact not anxious or depressed appearing.  CNS: CN 2-12 intact, power,  normal throughout.no focal deficits noted.   Assessment & Plan  Abnormal EKG Last seen cardiology Harrington Challenger) in 01/2022 with 1 year follow up planned, will defer to PCP who she has close f/u appt scheduled  Epigastric pain determined by examination Tender to palpation, add Carafate short term and GI eval  Acute cough cXR an triple virus testing  Chest pain CXR, advised to take gabapentin as recommended by pain management , inadvertently she has not been taking the  recommended dose, which is 600 m at bedtime, I believe that this will be benficial

## 2023-02-05 ENCOUNTER — Encounter: Payer: Self-pay | Admitting: Internal Medicine

## 2023-02-05 ENCOUNTER — Ambulatory Visit (HOSPITAL_COMMUNITY): Payer: Medicare Other | Admitting: Physical Therapy

## 2023-02-06 ENCOUNTER — Encounter: Payer: Self-pay | Admitting: Family Medicine

## 2023-02-07 ENCOUNTER — Encounter: Payer: Self-pay | Admitting: Dietician

## 2023-02-07 ENCOUNTER — Encounter: Payer: Medicare Other | Attending: Endocrinology | Admitting: Dietician

## 2023-02-07 DIAGNOSIS — E119 Type 2 diabetes mellitus without complications: Secondary | ICD-10-CM | POA: Diagnosis present

## 2023-02-07 LAB — COVID-19, FLU A+B AND RSV
Influenza A, NAA: NOT DETECTED
Influenza B, NAA: NOT DETECTED
RSV, NAA: NOT DETECTED
SARS-CoV-2, NAA: NOT DETECTED

## 2023-02-07 NOTE — Progress Notes (Signed)
Diabetes Self-Management Education  Visit Type:    Appt. Start Time: 1300 Appt. End Time: 1330  02/07/2023  Ms. Sydney Taylor, identified by name and date of birth, is a 57 y.o. female with a diagnosis of Diabetes:  .   ASSESSMENT Video visit.  Last visit 11/04/2022.  Patient is at her home and I am in my office.  Currently sick with an upper respiratory infection.  States that her blood glucose is better and notes that it is better when she is in a good frame of mind she eats better.  Notes that this is in her control. Less grazing and working on better things to graze on. Psychiatrist has started her on Modafinil.  This has decreased her appetite, increased her motivation, energy, and made her more talkative. She states that the holidays were very hard due to increased eating events. Her husband is encouraging and supportive.  He has been encouraging her to do armchair exercises with him.  and avoid buying sweets.  He is also working to lower his A1C. Currently no formal exercise.  Eating more plants Feels "poisoned" when she eats sugary foods. Continues to have chronic exhaustion and chronic pain. Is seeing a new pain specialist. Emotional eating continues at times.  History includes:  Type 2 Diabetes, MTHFR, Depression (sees a psychiatrist, and counselor, and [place] treatment), OSA on c-pap, HLD, HTN, vitamin B-12 deficiency, fibromyalgia Medications:  Jardiance, Exenatide, Toujeo 150 units q am, metformin, rosuvastatin, Methly volate, vitamin B-12, potassium, MVI, Bentyl, CoQ-10 Labs noted to include A1C 6.6% 01/30/2023 decreased from 7.1% 08/20/2022 and 8% 03/12/2022 CGM:  Dexcom G7 (uses reader)   CGM Results from download:   % Time CGM active:   100 %   (Goal >70%)  Average glucose:   151 mg/dL for 14 days  Glucose management indicator:   6.9 %  Time in range (70-180 mg/dL):   85 %   (Goal >70%)  Time High (181-250 mg/dL):   14 %   (Goal < 25%)  Time Very High (>250 mg/dL):     <1 %   (Goal < 5%)  Time Low (54-69 mg/dL):   0 %   (Goal <4%)  Time Very Low (<54 mg/dL):   0 %   (Goal <1%)    69.5" 275 lbs 02/04/2023 278 lbs 01/27/2023 271 lbs 11/04/2022 274 lbs 08/20/2022  Patient lives with her husband.  Patient does most of the cooking and they share shopping.  Husband also has diabetes. She is vegetarian and her husband is willing to eat this way most of the time. Grazes She started plant based eating in her 20's due to GI issues. She is on disability- chronic pain and low energy. 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 Sleep is fair - wears c-pap and sleeps lightly but feels better with the c-pap Stress - fair (external), lack of control of diabetes, financial Saw a RD in the past who was not positive about vegetarian eating. Joined a gym but lost motivation when it got hot.  They are planning to restart this Thursday.  130 fasting (usual 100-140)   Individualized Plan for Diabetes Self-Management Training:   Learning Objective:  Patient will have a greater understanding of diabetes self-management. Patient education plan is to attend individual and/or group sessions per assessed needs and concerns.   Plan:   Patient Instructions  Take care of yourself! Aim to be active most days.  Exercise is great for mental health.  Great job on starting with the decision.  Think about how this can realistically fit with your day.  Chair exercises with your husband? Balanced meals (protein, unprocessed carbohydrate, vegetables)   Mindfulness:             Consistently scheduled meal - avoid skipping             Choices             Eat slowly             Away from distraction (sitting in kitchen or dining room)             Stop eating when satisfied             Before a snack ask, "Am I hungry or eating for another reason?"                         "What can I do instead if I am not hungry?"   Try to find something every day that brings you  joy!  When eating a craved food/treat - slow the pace of eating, experience how the food tastes, feels, smells.  When does the experience go from great to eh?     Breakfast ideas  Oatmeal, nuts/seeds, fresh fruit, artificial sweetener or 1 tsp agave, maple syrup or honey Vegetarian sausage, roasted potatoes, fruit Scrambled tofu, roasted potatoes, fruit Breakfast burrito on low carb tortilla (use leftover scrambled tofu and roasted potatoes or fat free refried beans and salsa) See handout for ideas for breakfast and other meals.     Resources: you tube Roderic Ovens Podcast [employer] Well your world Nicola Girt  Expected Outcomes:     Education material provided:   If problems or questions, patient to contact team via:  Phone  Future DSME appointment:

## 2023-02-07 NOTE — Patient Instructions (Addendum)
Take care of yourself! Aim to be active most days.  Exercise is great for mental health.  Great job on starting with the decision.  Think about how this can realistically fit with your day.  Chair exercises with your husband? Balanced meals (protein, unprocessed carbohydrate, vegetables)   Mindfulness:             Consistently scheduled meal - avoid skipping             Choices             Eat slowly             Away from distraction (sitting in kitchen or dining room)             Stop eating when satisfied             Before a snack ask, "Am I hungry or eating for another reason?"                         "What can I do instead if I am not hungry?"   Try to find something every day that brings you joy!  When eating a craved food/treat - slow the pace of eating, experience how the food tastes, feels, smells.  When does the experience go from great to eh?     Breakfast ideas  Oatmeal, nuts/seeds, fresh fruit, artificial sweetener or 1 tsp agave, maple syrup or honey Vegetarian sausage, roasted potatoes, fruit Scrambled tofu, roasted potatoes, fruit Breakfast burrito on low carb tortilla (use leftover scrambled tofu and roasted potatoes or fat free refried beans and salsa) See handout for ideas for breakfast and other meals.     Resources: you tube Roderic Ovens Podcast [employer] Well your world Nicola Girt

## 2023-02-10 DIAGNOSIS — R051 Acute cough: Secondary | ICD-10-CM | POA: Insufficient documentation

## 2023-02-10 DIAGNOSIS — R9431 Abnormal electrocardiogram [ECG] [EKG]: Secondary | ICD-10-CM | POA: Insufficient documentation

## 2023-02-10 DIAGNOSIS — R1013 Epigastric pain: Secondary | ICD-10-CM | POA: Insufficient documentation

## 2023-02-10 NOTE — Assessment & Plan Note (Addendum)
CXR, advised to take gabapentin as recommended by pain management , inadvertently she has not been taking the recommended dose, which is 600 m at bedtime, I believe that this will be benficial

## 2023-02-10 NOTE — Assessment & Plan Note (Signed)
cXR an triple virus testing

## 2023-02-10 NOTE — Assessment & Plan Note (Addendum)
Last seen cardiology Harrington Challenger) in 01/2022 with 1 year follow up planned, will defer to PCP who she has close f/u appt scheduled

## 2023-02-10 NOTE — Assessment & Plan Note (Signed)
Tender to palpation, add Carafate short term and GI eval

## 2023-02-11 ENCOUNTER — Ambulatory Visit (HOSPITAL_COMMUNITY): Payer: Medicare Other | Attending: Orthopedic Surgery | Admitting: Physical Therapy

## 2023-02-11 NOTE — Therapy (Incomplete)
OUTPATIENT PHYSICAL THERAPY THORACOLUMBAR EVALUATION   Patient Name: Sydney Taylor MRN: [id number] DOB:[birth year], 57 y.o., female Today's Date: 02/11/2023  END OF SESSION:   Past Medical History:  Diagnosis Date   Anemia    Anxiety    Arrhythmia    Bipolar depression ([place]) 12/28/2019   Chronic fatigue    Chronic headaches    Depression    Diabetes mellitus without complication (HCC)    Fibromyalgia    GERD (gastroesophageal reflux disease)    High cholesterol    HLD (hyperlipidemia)    Hypertension    Insect bite    Migraine    Prediabetes    Sleep apnea    Urinary incontinence    Vasculitis ([place])    Past Surgical History:  Procedure Laterality Date   BIOPSY  09/04/2022   Procedure: BIOPSY;  Surgeon: Daneil Dolin, MD;  [facility]: [facility];  Service: Endoscopy;;   CHOLECYSTECTOMY N/A 03/15/2022   Procedure: LAPAROSCOPIC CHOLECYSTECTOMY WITH INTRAOPERATIVE CHOLANGIOGRAM;  Surgeon: Virl Cagey, MD;  Location: [facility];  Service: General;  Laterality: N/A;   CHOLECYSTECTOMY  02/03/2022   COLONOSCOPY  2017   per patient normal and needs next one in 2027 (Dr. Posey Pronto)   COLONOSCOPY WITH PROPOFOL N/A 09/04/2022   Procedure: COLONOSCOPY WITH PROPOFOL;  Surgeon: Daneil Dolin, MD;  [facility]: [facility];  Service: Endoscopy;  Laterality: N/A;   ESOPHAGOGASTRODUODENOSCOPY N/A 05/12/2020   Procedure: ESOPHAGOGASTRODUODENOSCOPY (EGD);  Surgeon: Daneil Dolin, MD;  [facility]: [facility];  Service: Endoscopy;  Laterality: N/A;  11:15am   Joint fusion on foot Right    LIVER BIOPSY N/A 03/15/2022   Procedure: LIVER BIOPSY;  Surgeon: Virl Cagey, MD;  Location: [facility];  Service: General;  Laterality: N/A;   MALONEY DILATION N/A 05/12/2020   Procedure: Keturah Shavers;  Surgeon: Daneil Dolin, MD;  [facility]: [facility];  Service: Endoscopy;  Laterality: N/A;   NASAL SEPTUM SURGERY     POLYPECTOMY  09/04/2022   Procedure: POLYPECTOMY;  Surgeon:  Daneil Dolin, MD;  [facility]: [facility];  Service: Endoscopy;;   SINOSCOPY     Patient Active Problem List   Diagnosis Date Noted   Abnormal EKG 02/10/2023   Epigastric pain determined by examination 02/10/2023   Acute cough 02/10/2023   Acute right-sided thoracic back pain 01/30/2023   Myofascial pain dysfunction syndrome 01/27/2023   Chronic fatigue 10/07/2022   Cutaneous vasculitis 09/23/2022   Diarrhea 07/30/2022   Rash and nonspecific skin eruption 06/07/2022   Essential hypertension 06/03/2022   Fatty liver 03/05/2022   Gallstone pancreatitis 03/04/2022   Encounter for general adult medical examination with abnormal findings 02/01/2022   Plantar fasciitis 02/01/2022   Chest pain 01/14/2022   Chronic migraine with aura 10/29/2021   Diabetic polyneuropathy associated with type 2 diabetes mellitus ([place]) 09/19/2021   History of hormone therapy 09/19/2021   Bilateral primary osteoarthritis of knee 04/10/2021   Perennial allergic rhinitis 04/06/2021   Allergic contact dermatitis 04/06/2021   Cholinergic urticaria 04/06/2021   Fibromyalgia syndrome 01/12/2021   Elevated C-reactive protein (CRP) 01/12/2021   Diverticulosis of colon without hemorrhage 01/02/2021   Major depressive disorder, single episode, severe ([place]) 07/14/2020   GERD (gastroesophageal reflux disease) 05/11/2020   Type 2 diabetes mellitus ([place]) 03/27/2020   Bipolar depression ([place]) 12/28/2019   Sleep apnea 12/28/2019   Tachycardia 10/31/2019   Hyperlipidemia 10/30/2015   Morbid obesity ([place]) 10/30/2015    PCP: Ihor Dow MD  REFERRING PROVIDER: Mordecai Rasmussen, MD  REFERRING DIAG: [phone] (ICD-10-CM) - Sciatica, left side  Rationale for Evaluation and Treatment: Rehabilitation  THERAPY DIAG:  No diagnosis found.  ONSET DATE: ***  SUBJECTIVE:                                                                                                                                                                                            SUBJECTIVE STATEMENT: ***  PERTINENT HISTORY:  Chronic LBP, fibromyalgia, anxiety, depression DM, HLD, HTN, bipolar disorder  PAIN:  Are you having pain? {OPRCPAIN:27236}  PRECAUTIONS: {Therapy precautions:24002}  WEIGHT BEARING RESTRICTIONS: {Yes ***/No:24003}  FALLS:  Has patient fallen in last 6 months? {fallsyesno:27318}  OCCUPATION: ***  PLOF: {PLOF:24004}  PATIENT GOALS: ***  NEXT MD VISIT: ***  OBJECTIVE:   DIAGNOSTIC FINDINGS:  XR 11/25/22 Impression: Lumbar spine with diffuse degenerative changes.   PATIENT SURVEYS: {rehab surveys:24030}  SCREENING FOR RED FLAGS: Bowel or bladder incontinence: {Yes/No:304960894} Spinal tumors: {Yes/No:304960894} Cauda equina syndrome: {Yes/No:304960894} Compression fracture: {Yes/No:304960894} Abdominal aneurysm: {Yes/No:304960894}  COGNITION: Overall cognitive status: {cognition:24006}     SENSATION: {sensation:27233}  MUSCLE LENGTH: Hamstrings: Right *** deg; Left *** deg Thomas test: Right *** deg; Left *** deg  POSTURE: {posture:25561}  PALPATION: ***  LUMBAR ROM:   AROM eval  Flexion   Extension   Right lateral flexion   Left lateral flexion   Right rotation   Left rotation    (Blank rows = not tested)  LOWER EXTREMITY ROM:     Active  Right eval Left eval  Hip flexion    Hip extension    Hip abduction    Hip adduction    Hip internal rotation    Hip external rotation    Knee flexion    Knee extension    Ankle dorsiflexion    Ankle plantarflexion    Ankle inversion    Ankle eversion     (Blank rows = not tested)  LOWER EXTREMITY MMT:    MMT Right eval Left eval  Hip flexion    Hip extension    Hip abduction    Hip adduction    Hip internal rotation    Hip external rotation    Knee flexion    Knee extension    Ankle dorsiflexion    Ankle plantarflexion    Ankle inversion    Ankle eversion     (Blank rows = not tested)  LUMBAR SPECIAL  TESTS:  {lumbar special test:25242}  FUNCTIONAL TESTS:  {Functional tests:24029}  GAIT: Distance walked: *** Assistive device utilized: {Assistive devices:23999} Level of assistance: {Levels of assistance:24026} Comments: ***  TODAY'S TREATMENT:                                                                                                                              DATE:  02/11/23 ***    PATIENT EDUCATION:  Education details: Patient educated on exam findings, POC, scope of PT, HEP, and ***. Person educated: Patient Education method: Explanation, Demonstration, and Handouts Education comprehension: verbalized understanding, returned demonstration, verbal cues required, and tactile cues required  HOME EXERCISE PROGRAM: ***  ASSESSMENT:  CLINICAL IMPRESSION: Patient a 57 y.o. y.o. female who was seen today for physical therapy evaluation and treatment for L sciatica. Patient presents with pain limited deficits in *** strength, ROM, endurance, activity tolerance, and functional mobility with ADL. Patient is having to modify and restrict ADL as indicated by outcome measure score as well as subjective information and objective measures which is affecting overall participation. Patient will benefit from skilled physical therapy in order to improve function and reduce impairment.  OBJECTIVE IMPAIRMENTS: Abnormal gait, decreased activity tolerance, decreased endurance, decreased mobility, difficulty walking, decreased ROM, decreased strength, increased muscle spasms, impaired flexibility, improper body mechanics, postural dysfunction, and pain.   ACTIVITY LIMITATIONS: carrying, lifting, bending, standing, stairs, transfers, locomotion level, and caring for others  PARTICIPATION LIMITATIONS: meal prep, cleaning, laundry, shopping, community activity, and yard work  PERSONAL FACTORS: Fitness, Time since onset of injury/illness/exacerbation, and 3+ comorbidities: Chronic LBP,  fibromyalgia, anxiety, depression DM, HLD, HTN, bipolar disorder  are also affecting patient's functional outcome.   REHAB POTENTIAL: {rehabpotential:25112}  CLINICAL DECISION MAKING: {clinical decision making:25114}  EVALUATION COMPLEXITY: {Evaluation complexity:25115}   GOALS: Goals reviewed with patient? Yes  SHORT TERM GOALS: Target date: ***  Patient will be independent with HEP in order to improve functional outcomes. Baseline:  Goal status: INITIAL  2.  Patient will report at least 25% improvement in symptoms for improved quality of life. Baseline:  Goal status: INITIAL  3.  *** Baseline: *** Goal status: {GOALSTATUS:25110}  4.  *** Baseline: *** Goal status: {GOALSTATUS:25110}  5.  *** Baseline: *** Goal status: {GOALSTATUS:25110}  6.  *** Baseline: *** Goal status: {GOALSTATUS:25110}  LONG TERM GOALS: Target date: ***  Patient will report at least 75% improvement in symptoms for improved quality of life. Baseline:  Goal status: INITIAL  2.  Patient will improve FOTO score by at least *** points in order to indicate improved tolerance to activity. Baseline: *** Goal status: INITIAL  3.  Patient will demonstrate at least 25% improvement in lumbar ROM in all restricted planes for improved ability to move trunk while completing chores. Baseline: see above Goal status: INITIAL  4.  Patient will be able to ambulate at least *** feet in 2MWT in order to demonstrate improved tolerance to activity. Baseline: *** Goal status: INITIAL  5.  Patient will demonstrate grade of 5/5 MMT grade in all tested musculature as evidence of improved strength to assist with stair ambulation and gait.  Baseline: *** Goal status: INITIAL  6.  Patient will be able to complete 5x STS in under 11.4 seconds in order to reduce the risk of falls. Baseline: *** Goal status: INITIAL   PLAN:  PT FREQUENCY: {rehab frequency:25116}  PT DURATION: {rehab  duration:25117}  PLANNED INTERVENTIONS: Therapeutic exercises, Therapeutic activity, Neuromuscular re-education, Balance training, Gait training, Patient/Family education, Joint manipulation, Joint mobilization, Stair training, Orthotic/Fit training, DME instructions, Aquatic Therapy, Dry Needling, Electrical stimulation, Spinal manipulation, Spinal mobilization, Cryotherapy, Moist heat, Compression bandaging, scar mobilization, Splintting, Taping, Traction, Ultrasound, Ionotophoresis 53m/ml Dexamethasone, and Manual therapy  PLAN FOR NEXT SESSION: ***   AVianne BullsZaunegger, PT 02/11/2023, 12:07 PM

## 2023-02-16 ENCOUNTER — Other Ambulatory Visit: Payer: Self-pay | Admitting: Endocrinology

## 2023-02-17 ENCOUNTER — Encounter: Payer: Medicare Other | Admitting: Physical Medicine and Rehabilitation

## 2023-02-19 ENCOUNTER — Encounter: Payer: Medicare Other | Admitting: Internal Medicine

## 2023-02-19 ENCOUNTER — Telehealth: Payer: Self-pay | Admitting: Gastroenterology

## 2023-02-19 DIAGNOSIS — D7389 Other diseases of spleen: Secondary | ICD-10-CM

## 2023-02-19 NOTE — Telephone Encounter (Signed)
Patient has upcoming ov with Dr. Gala Romney in 02/2023. She needs to keep that appt. Following up on prior CT imaging, discussed with Dr. Gala Romney.   He advises surveillance CT to follow up on splenic lesion seen in 2022. It is not visible by u/s, best modality to view at this time would be CT.   If agreeable, please schedule CT Abd with contrast for indeterminate splenic lesion seen on previous 2022 CT.

## 2023-02-20 ENCOUNTER — Encounter: Payer: Self-pay | Admitting: Internal Medicine

## 2023-02-20 NOTE — Addendum Note (Signed)
Addended by: Cheron Every on: 02/20/2023 09:23 AM   Modules accepted: Orders

## 2023-02-20 NOTE — Telephone Encounter (Signed)
Pt was made aware and verbalized understanding. Pt is agreeable with scheduling CT.

## 2023-02-21 MED ORDER — AZITHROMYCIN 250 MG PO TABS: ORAL_TABLET | ORAL | 0 refills | Status: AC

## 2023-02-26 ENCOUNTER — Encounter: Payer: Self-pay | Admitting: Endocrinology

## 2023-02-27 ENCOUNTER — Encounter: Payer: Self-pay | Admitting: Radiology

## 2023-02-27 ENCOUNTER — Other Ambulatory Visit: Payer: Self-pay

## 2023-03-03 ENCOUNTER — Ambulatory Visit: Payer: Medicare Other | Admitting: Pulmonary Disease

## 2023-03-03 ENCOUNTER — Encounter: Payer: Self-pay | Admitting: Pulmonary Disease

## 2023-03-04 ENCOUNTER — Ambulatory Visit: Payer: Medicare Other | Admitting: Pulmonary Disease

## 2023-03-05 ENCOUNTER — Ambulatory Visit (INDEPENDENT_AMBULATORY_CARE_PROVIDER_SITE_OTHER): Payer: Medicare Other | Admitting: Internal Medicine

## 2023-03-05 ENCOUNTER — Encounter: Payer: Self-pay | Admitting: Internal Medicine

## 2023-03-05 VITALS — BP 129/84 | HR 109 | Ht 70.0 in | Wt 277.0 lb

## 2023-03-05 DIAGNOSIS — E1142 Type 2 diabetes mellitus with diabetic polyneuropathy: Secondary | ICD-10-CM

## 2023-03-05 DIAGNOSIS — F319 Bipolar disorder, unspecified: Secondary | ICD-10-CM

## 2023-03-05 DIAGNOSIS — Z794 Long term (current) use of insulin: Secondary | ICD-10-CM

## 2023-03-05 DIAGNOSIS — J3089 Other allergic rhinitis: Secondary | ICD-10-CM | POA: Diagnosis not present

## 2023-03-05 DIAGNOSIS — E782 Mixed hyperlipidemia: Secondary | ICD-10-CM

## 2023-03-05 DIAGNOSIS — M7989 Other specified soft tissue disorders: Secondary | ICD-10-CM

## 2023-03-05 DIAGNOSIS — I10 Essential (primary) hypertension: Secondary | ICD-10-CM | POA: Diagnosis not present

## 2023-03-05 DIAGNOSIS — Z0001 Encounter for general adult medical examination with abnormal findings: Secondary | ICD-10-CM | POA: Diagnosis not present

## 2023-03-05 MED ORDER — LOSARTAN POTASSIUM 25 MG PO TABS: 25.0000 mg | ORAL_TABLET | Freq: Every day | ORAL | 1 refills | Status: DC

## 2023-03-05 MED ORDER — OMEGA-3-ACID ETHYL ESTERS 1 G PO CAPS: 2.0000 g | ORAL_CAPSULE | Freq: Two times a day (BID) | ORAL | 5 refills | Status: AC

## 2023-03-05 MED ORDER — CHLORTHALIDONE 25 MG PO TABS: 12.5000 mg | ORAL_TABLET | Freq: Every day | ORAL | 1 refills | Status: DC

## 2023-03-05 NOTE — Progress Notes (Signed)
Established Patient Office Visit  Subjective:  Patient ID: Sydney Taylor, female    DOB: 05/07/1966  Age: 57 y.o. MRN: [id number]  CC:  Chief Complaint  Patient presents with   Annual Exam    Patient has been having issues with upper respiratory problems since October. She is constantly having coughing fits.    HPI Sydney Taylor is a 57 y.o. female with past medical history of OSA, GERD, type II DM with diabetic neuropathy, HLD, tachycardia, depression with anxiety, fibromyalgia and obesity who presents for annual physical.  She still has chronic nasal congestion, postnasal drip, sore throat and dry cough since 09/29/22.  She has tried Zyrtec, Claritin and Clarinex without much relief.  She has had ENT and allergy specialist evaluation as well.  She denies any fever or chills currently.  Type II DM: She is on Toujeo 150 units nightly, metformin 1000 mg daily and Jardiance 25 mg daily. Followed by endocrinology.  She has CGM now.  She has chronic fatigue, but denies any polyuria or polyphagia.  She has had an episode of candidal vulvovaginitis, and asked about Jardiance.  I had discussion with her about continuing the Jardiance for now and if she has recurrent vulvovaginitis, we can discuss about discontinuing Jardiance.  Bipolar depression: She sees [employer] for it.  She is on Cymbalta and modafinil currently.  She still has a patchy, chronic fatigue and insomnia.  She has had suicidal thoughts in the past, but denies any plan.  Fibromyalgia: She has chronic myalgias and takes Cymbalta and gabapentin for it.    Past Medical History:  Diagnosis Date   Anemia    Anxiety    Arrhythmia    Bipolar depression ([place]) 12/28/2019   Chronic fatigue    Chronic headaches    Depression    Diabetes mellitus without complication (HCC)    Fibromyalgia    GERD (gastroesophageal reflux disease)    High cholesterol    HLD (hyperlipidemia)    Hypertension    Insect bite     Migraine    Prediabetes    Sleep apnea    Urinary incontinence    Vasculitis ([place])     Past Surgical History:  Procedure Laterality Date   BIOPSY  09/04/2022   Procedure: BIOPSY;  Surgeon: Daneil Dolin, MD;  [facility];  Service: Endoscopy;;   CHOLECYSTECTOMY N/A 03/15/2022   Procedure: LAPAROSCOPIC CHOLECYSTECTOMY WITH INTRAOPERATIVE CHOLANGIOGRAM;  Surgeon: Virl Cagey, MD;  Location: [facility];  Service: General;  Laterality: N/A;   CHOLECYSTECTOMY  02/03/2022   COLONOSCOPY  2017   per patient normal and needs next one in 2027 (Dr. Posey Pronto)   COLONOSCOPY WITH PROPOFOL N/A 09/04/2022   Procedure: COLONOSCOPY WITH PROPOFOL;  Surgeon: Daneil Dolin, MD;  [facility]: [facility];  Service: Endoscopy;  Laterality: N/A;   ESOPHAGOGASTRODUODENOSCOPY N/A 05/12/2020   Procedure: ESOPHAGOGASTRODUODENOSCOPY (EGD);  Surgeon: Daneil Dolin, MD;  [facility];  Service: Endoscopy;  Laterality: N/A;  11:15am   Joint fusion on foot Right    LIVER BIOPSY N/A 03/15/2022   Procedure: LIVER BIOPSY;  Surgeon: Virl Cagey, MD;  Location: [facility];  Service: General;  Laterality: N/A;   MALONEY DILATION N/A 05/12/2020   Procedure: Keturah Shavers;  Surgeon: Daneil Dolin, MD;  [facility]: [facility];  Service: Endoscopy;  Laterality: N/A;   NASAL SEPTUM SURGERY     POLYPECTOMY  09/04/2022   Procedure: POLYPECTOMY;  Surgeon: Gala Romney,  Cristopher Estimable, MD;  [facility]: [facility];  Service: Endoscopy;;   SINOSCOPY      Family History  Problem Relation Age of Onset   Hypertension Father    Hyperlipidemia Father    Diabetes Father    Stroke Father    Depression Father    Anxiety disorder Father    Hypertension Mother    Hyperlipidemia Mother    Diabetes Mellitus II Mother    Congestive Heart Failure Mother    Heart attack Mother    Hypertension Sister    Diabetes Sister    Hypertension Brother    Autoimmune disease Brother    Diabetes Maternal Grandmother     Lupus Paternal Grandmother    Depression Paternal Grandmother    Anxiety disorder Paternal Grandmother    Depression Maternal Aunt    Anxiety disorder Maternal Aunt    Colon cancer Neg Hx    Esophageal cancer Neg Hx    Gastric cancer Neg Hx     Social History   Socioeconomic History   Marital status: Married    Spouse name: Not on file   Number of children: 0   Years of education: Not on file   Highest education level: Bachelor's degree (e.g., BA, AB, BS)  Occupational History   Occupation: research associate/drug development  Tobacco Use   Smoking status: Former    Types: Cigarettes    Quit date: 12/30/1982    Years since quitting: 40.2   Smokeless tobacco: Never  Vaping Use   Vaping Use: Never used  Substance and Sexual Activity   Alcohol use: Not Currently    Alcohol/week: 0.0 standard drinks of alcohol   Drug use: Never   Sexual activity: Yes    Birth control/protection: Post-menopausal  Other Topics Concern   Not on file  Social History Narrative   Married for 3 years.Lives with husband.Bachelors in Education officer, museum.   Right handed   Caffeine: 1-2 per day    Social Determinants of Health   Financial Resource Strain: Low Risk  (06/04/2022)   Overall Financial Resource Strain (CARDIA)    Difficulty of Paying Living Expenses: Not hard at all  Food Insecurity: No Food Insecurity (06/04/2022)   Hunger Vital Sign    Worried About Running Out of Food in the Last Year: Never true    Ran Out of Food in the Last Year: Never true  Transportation Needs: No Transportation Needs (06/04/2022)   PRAPARE - Hydrologist (Medical): No    Lack of Transportation (Non-Medical): No  Physical Activity: Inactive (06/04/2022)   Exercise Vital Sign    Days of Exercise per Week: 0 days    Minutes of Exercise per Session: 0 min  Stress: No Stress Concern Present (06/04/2022)   [place]    Feeling of Stress  : Not at all  Social Connections: Moderately Integrated (06/04/2022)   Social Connection and Isolation Panel [NHANES]    Frequency of Communication with Friends and Family: More than three times a week    Frequency of Social Gatherings with Friends and Family: More than three times a week    Attends Religious Services: More than 4 times per year    Active Member of [employer] or Organizations: No    Attends Archivist Meetings: Never    Marital Status: Married  Human resources officer Violence: Not At Risk (06/04/2022)   Humiliation, Afraid, Rape, and Kick questionnaire    Fear  of Current or Ex-Partner: No    Emotionally Abused: No    Physically Abused: No    Sexually Abused: No    Outpatient Medications Prior to Visit  Medication Sig Dispense Refill   ALPRAZolam (XANAX) 0.5 MG tablet Take 0.25 mg by mouth daily as needed for anxiety.     B-12 Methylcobalamin 1000 MCG TBDP Take 1,000 mcg by mouth daily.     blood glucose meter kit and supplies KIT Inject 1 each into the skin daily. Check blood glucose once a day. Diagnosis code: E 11.9. 1 each 0   calcium carbonate (TUMS - DOSED IN MG ELEMENTAL CALCIUM) 500 MG chewable tablet Chew 1,000 mg by mouth daily as needed for indigestion or heartburn.     celecoxib (CELEBREX) 100 MG capsule Take 1 capsule (100 mg total) by mouth daily as needed. 30 capsule 2   Continuous Blood Gluc Sensor (DEXCOM G6 SENSOR) MISC by Does not apply route.     Continuous Blood Gluc Transmit (DEXCOM G6 TRANSMITTER) MISC by Does not apply route.     cyclobenzaprine (FLEXERIL) 5 MG tablet Take 1 tablet (5 mg total) by mouth 2 (two) times daily between meals as needed for muscle spasms. 30 tablet 1   dicyclomine (BENTYL) 10 MG capsule Take 1 capsule (10 mg total) by mouth 4 (four) times daily -  before meals and at bedtime. For abdominal cramping and loose stool (Patient taking differently: Take 10 mg by mouth daily. For abdominal cramping and loose stool) 120 capsule 1    DULoxetine (CYMBALTA) 60 MG capsule Take 60 mg by mouth every morning.     empagliflozin (JARDIANCE) 25 MG TABS tablet TAKE 1 TABLET BY MOUTH ONCE DAILY BEFORE BREAKFAST 30 tablet 3   Exenatide ER (BYDUREON BCISE) 2 MG/0.85ML AUIJ INJECT 2 MG SUBCUTANEOUSLY ONCE A WEEK 4 mL 2   gabapentin (NEURONTIN) 300 MG capsule Take 1 capsule (300 mg total) by mouth at bedtime. X 1 week then can increase to 600 mg QHS if possible- for FMS 60 capsule 5   glucose blood (ACCU-CHEK GUIDE) test strip 1 each by Other route in the morning, at noon, and at bedtime. Use as instructed 300 each 3   insulin glargine, 2 Unit Dial, (TOUJEO MAX SOLOSTAR) 300 UNIT/ML Solostar Pen Inject 170 Units into the skin every morning. And pen needles 1/day 51 mL 1   Insulin Pen Needle (PEN NEEDLES) 32G X 4 MM MISC Check BS 2 x a day 100 each 5   Insulin Pen Needle (RELION PEN NEEDLES) 31G X 6 MM MISC USE 1 PEN NEEDLE ONCE A DAY. 50 each 5   L-Methylfolate 15 MG TABS Take 15 mg by mouth daily at 6 (six) AM.     lidocaine (XYLOCAINE) 5 % ointment Apply 1 Application topically 4 (four) times daily as needed. 50 g 5   metFORMIN (GLUCOPHAGE-XR) 500 MG 24 hr tablet Take 2 tablets (1,000 mg total) by mouth daily with breakfast. (Patient taking differently: Take 1,000 mg by mouth daily with supper.) 180 tablet 3   metoprolol tartrate (LOPRESSOR) 25 MG tablet Take 1 tablet (25 mg total) by mouth 2 (two) times daily. 180 tablet 3   modafinil (PROVIGIL) 100 MG tablet Take 100 mg by mouth every morning.     Multiple Vitamins-Minerals (MULTIVITAMIN WOMEN 50+) TABS Take 1 tablet by mouth daily.     pantoprazole (PROTONIX) 40 MG tablet Take 1 tablet (40 mg total) by mouth 2 (two) times daily. 60 tablet  3   potassium chloride SA (KLOR-CON M) 20 MEQ tablet Take 2 tablets (40 mEq total) by mouth 2 (two) times daily. 720 tablet 0   rosuvastatin (CRESTOR) 10 MG tablet Take 1 tablet (10 mg total) by mouth daily. (Patient taking differently: Take 10 mg by  mouth every other day.) 30 tablet 11   sucralfate (CARAFATE) 1 GM/10ML suspension Take 10 mLs (1 g total) by mouth 2 (two) times daily. 420 mL 0   Aspirin-Caffeine (BC FAST PAIN RELIEF PO) Take 1 packet by mouth daily as needed (pain). (Patient not taking: Reported on 01/27/2023)     chlorthalidone (HYGROTON) 25 MG tablet Take 1/2 (one-half) tablet by mouth once daily 45 tablet 0   desloratadine (CLARINEX) 5 MG tablet Take 1 tablet (5 mg total) by mouth daily. 30 tablet 2   losartan (COZAAR) 25 MG tablet Take 1 tablet by mouth once daily 90 tablet 0   traZODone (DESYREL) 50 MG tablet Take 25 mg by mouth at bedtime.     No facility-administered medications prior to visit.    Allergies  Allergen Reactions   Cephalexin Itching, Swelling and Other (See Comments)    ROS Review of Systems  Constitutional:  Positive for fatigue. Negative for chills and fever.  HENT:  Positive for congestion and sinus pressure. Negative for ear discharge.   Respiratory:  Positive for cough. Negative for shortness of breath and wheezing.   Cardiovascular:  Negative for chest pain and palpitations.  Gastrointestinal:  Negative for diarrhea and vomiting.  Genitourinary:  Negative for dysuria and hematuria.  Musculoskeletal:  Positive for back pain. Negative for neck pain and neck stiffness.  Skin:  Positive for rash.  Neurological:  Positive for headaches. Negative for dizziness and weakness.  Psychiatric/Behavioral:  Negative for agitation and behavioral problems.       Objective:    Physical Exam Constitutional:      General: She is not in acute distress.    Appearance: She is obese. She is not diaphoretic.  HENT:     Head: Normocephalic and atraumatic.     Nose: Congestion present.     Mouth/Throat:     Mouth: Mucous membranes are dry.     Pharynx: No posterior oropharyngeal erythema.  Eyes:     General: No scleral icterus.    Extraocular Movements: Extraocular movements intact.  Cardiovascular:      Rate and Rhythm: Normal rate and regular rhythm.     Pulses: Normal pulses.     Heart sounds: Normal heart sounds. No murmur heard. Pulmonary:     Breath sounds: Normal breath sounds. No wheezing or rales.  Abdominal:     Palpations: Abdomen is soft.     Tenderness: There is no abdominal tenderness.  Musculoskeletal:     Cervical back: Neck supple. No tenderness.     Thoracic back: Tenderness present. Decreased range of motion.     Right lower leg: Edema (Trace) present.     Left lower leg: Edema (Trace) present.  Skin:    General: Skin is dry.     Coloration: Skin is not pale.     Findings: Rash (Multiple depigmented areas - circular/oval over b/l thigh and axilla, right leg) present.  Neurological:     General: No focal deficit present.     Mental Status: She is alert and oriented to person, place, and time.  Psychiatric:        Mood and Affect: Mood is depressed. Affect is flat.  Behavior: Behavior normal.        Thought Content: Thought content normal.     BP 129/84 (BP Location: Right Arm, Patient Position: Sitting, Cuff Size: Large)   Pulse (!) 109   Ht '5\' 10"'$  (1.778 m)   Wt 277 lb (125.6 kg)   LMP  (LMP Unknown)   SpO2 91%   BMI 39.75 kg/m  Wt Readings from Last 3 Encounters:  03/05/23 277 lb (125.6 kg)  02/04/23 275 lb 1.3 oz (124.8 kg)  01/30/23 274 lb 3.2 oz (124.4 kg)    Lab Results  Component Value Date   TSH 1.520 01/30/2023   Lab Results  Component Value Date   WBC 10.2 01/30/2023   HGB 14.0 01/30/2023   HCT 44.2 01/30/2023   MCV 90 01/30/2023   PLT 349 01/30/2023   Lab Results  Component Value Date   NA 143 01/30/2023   K 4.6 01/30/2023   CO2 23 01/30/2023   GLUCOSE 156 (H) 01/30/2023   BUN 15 01/30/2023   CREATININE 0.98 01/30/2023   BILITOT 0.3 01/30/2023   ALKPHOS 95 01/30/2023   AST 23 01/30/2023   ALT 24 01/30/2023   PROT 6.3 01/30/2023   ALBUMIN 4.3 01/30/2023   CALCIUM 9.7 01/30/2023   ANIONGAP 10 03/15/2022   EGFR  67 01/30/2023   Lab Results  Component Value Date   CHOL 160 01/30/2023   Lab Results  Component Value Date   HDL 37 (L) 01/30/2023   Lab Results  Component Value Date   LDLCALC 77 01/30/2023   Lab Results  Component Value Date   TRIG 281 (H) 01/30/2023   Lab Results  Component Value Date   CHOLHDL 4.3 01/30/2023   Lab Results  Component Value Date   HGBA1C 6.6 (H) 01/30/2023      Assessment & Plan:   Problem List Items Addressed This Visit       Cardiovascular and Mediastinum   Essential hypertension    BP Readings from Last 1 Encounters:  03/05/23 129/84  Well-controlled with losartan 25 mg QD, Chlorthalidone 12.5 mg QD and Metoprolol 25 mg BID Counseled for compliance with the medications Advised DASH diet and moderate exercise/walking, at least 150 mins/week      Relevant Medications   omega-3 acid ethyl esters (LOVAZA) 1 g capsule   chlorthalidone (HYGROTON) 25 MG tablet   losartan (COZAAR) 25 MG tablet     Respiratory   Perennial allergic rhinitis    Her current symptoms are likely due to allergic sinusitis Has tried Clarinex, Zyrtec and Claritin without much relief Uses Flonase Has been seen by ENT specialist and allergy specialist        Endocrine   Type 2 diabetes mellitus ([place])     Lab Results  Component Value Date   HGBA1C 6.6 (H) 01/30/2023  Improved now On Jardiance and Bydureon On metformin XR and Toujeo 150 units nightly Followed by endocrinology Advised to follow diabetic diet On ARB and statin F/u CMP and lipid panel Diabetic eye exam: Advised to follow up with Ophthalmology for diabetic eye exam      Relevant Medications   losartan (COZAAR) 25 MG tablet   Diabetic polyneuropathy associated with type 2 diabetes mellitus (HCC)    On Gabapentin Her heat sensitivity and cold, clammy extremities could be due to neuropathy and/or anxiety      Relevant Medications   losartan (COZAAR) 25 MG tablet     Other   Hyperlipidemia     On  Crestor Added Lovaza for hyper TG      Relevant Medications   omega-3 acid ethyl esters (LOVAZA) 1 g capsule   chlorthalidone (HYGROTON) 25 MG tablet   losartan (COZAAR) 25 MG tablet   Morbid obesity ([place])    Advised to follow low-carb diet and moderate exercise/walking as tolerated On Bydureon      Bipolar depression ([place])    Uncontrolled On Cymbalta and Modafinil Followed by psychiatry - [facility]      Encounter for general adult medical examination with abnormal findings - Primary    Physical exam as documented. Counseling done  re healthy lifestyle involving commitment to 150 minutes exercise per week, heart healthy diet, and attaining healthy weight.The importance of adequate sleep also discussed. Changes in health habits are decided on by the patient with goals and time frames  set for achieving them. Immunization and cancer screening needs are specifically addressed at this visit.      Other Visit Diagnoses     Leg swelling       Relevant Medications   chlorthalidone (HYGROTON) 25 MG tablet       Meds ordered this encounter  Medications   omega-3 acid ethyl esters (LOVAZA) 1 g capsule    Sig: Take 2 capsules (2 g total) by mouth 2 (two) times daily.    Dispense:  120 capsule    Refill:  5   chlorthalidone (HYGROTON) 25 MG tablet    Sig: Take 0.5 tablets (12.5 mg total) by mouth daily.    Dispense:  45 tablet    Refill:  1   losartan (COZAAR) 25 MG tablet    Sig: Take 1 tablet (25 mg total) by mouth daily.    Dispense:  90 tablet    Refill:  1    Follow-up: Return in about 6 months (around 09/05/2023) for DM and HTN.    Lindell Spar, MD

## 2023-03-05 NOTE — Assessment & Plan Note (Deleted)
On Wellbutrin and Cymbalta Followed by psychiatry - [facility]

## 2023-03-05 NOTE — Assessment & Plan Note (Addendum)
  Lab Results  Component Value Date   HGBA1C 6.6 (H) 01/30/2023   Improved now On Jardiance and Bydureon On metformin XR and Toujeo 150 units nightly Followed by endocrinology Advised to follow diabetic diet On ARB and statin F/u CMP and lipid panel Diabetic eye exam: Advised to follow up with Ophthalmology for diabetic eye exam

## 2023-03-05 NOTE — Assessment & Plan Note (Addendum)
On Crestor Added Lovaza for hyper TG

## 2023-03-05 NOTE — Assessment & Plan Note (Signed)
Advised to follow low-carb diet and moderate exercise/walking as tolerated On Bydureon

## 2023-03-05 NOTE — Assessment & Plan Note (Signed)
BP Readings from Last 1 Encounters:  03/05/23 129/84   Well-controlled with losartan 25 mg QD, Chlorthalidone 12.5 mg QD and Metoprolol 25 mg BID Counseled for compliance with the medications Advised DASH diet and moderate exercise/walking, at least 150 mins/week

## 2023-03-05 NOTE — Assessment & Plan Note (Addendum)
Uncontrolled On Cymbalta and Modafinil Followed by psychiatry - [facility]

## 2023-03-05 NOTE — Assessment & Plan Note (Signed)
On Gabapentin Her heat sensitivity and cold, clammy extremities could be due to neuropathy and/or anxiety

## 2023-03-05 NOTE — Patient Instructions (Addendum)
Please continue to take medications as prescribed.  Please continue to follow low carb diet and perform moderate exercise/walking at least 150 mins/week.  Please use humidifier and/or vaporizer for chronic sinusitis. Okay to use Allegra or Zyrtec as needed for allergies.

## 2023-03-05 NOTE — Assessment & Plan Note (Addendum)
Her current symptoms are likely due to allergic sinusitis Has tried Clarinex, Zyrtec and Claritin without much relief Uses Flonase Has been seen by ENT specialist and allergy specialist

## 2023-03-05 NOTE — Assessment & Plan Note (Signed)

## 2023-03-07 ENCOUNTER — Ambulatory Visit: Payer: Medicare Other | Admitting: Endocrinology

## 2023-03-14 ENCOUNTER — Encounter: Payer: Self-pay | Admitting: Endocrinology

## 2023-03-14 DIAGNOSIS — E1165 Type 2 diabetes mellitus with hyperglycemia: Secondary | ICD-10-CM

## 2023-03-17 ENCOUNTER — Encounter: Payer: Self-pay | Admitting: Orthopedic Surgery

## 2023-03-17 DIAGNOSIS — M5432 Sciatica, left side: Secondary | ICD-10-CM

## 2023-03-17 DIAGNOSIS — J329 Chronic sinusitis, unspecified: Secondary | ICD-10-CM | POA: Insufficient documentation

## 2023-03-18 MED ORDER — EXENATIDE ER 2 MG ~~LOC~~ PEN: PEN_INJECTOR | SUBCUTANEOUS | 1 refills | Status: DC

## 2023-03-25 ENCOUNTER — Ambulatory Visit: Payer: Medicare Other | Admitting: Internal Medicine

## 2023-03-25 ENCOUNTER — Encounter: Payer: Self-pay | Admitting: Internal Medicine

## 2023-04-01 ENCOUNTER — Ambulatory Visit (HOSPITAL_COMMUNITY)
Admission: RE | Admit: 2023-04-01 | Discharge: 2023-04-01 | Disposition: A | Discharge status: Home / Self Care | Payer: Medicare Other | Source: Ambulatory Visit | Attending: Gastroenterology | Admitting: Gastroenterology

## 2023-04-01 DIAGNOSIS — D7389 Other diseases of spleen: Secondary | ICD-10-CM | POA: Diagnosis present

## 2023-04-01 MED ORDER — IOHEXOL 300 MG/ML  SOLN
80.0000 mL | Freq: Once | INTRAMUSCULAR | Status: AC | PRN
  Administered 2023-04-01: 80 mL via INTRAVENOUS

## 2023-04-02 ENCOUNTER — Telehealth: Payer: Self-pay

## 2023-04-02 ENCOUNTER — Encounter: Payer: Self-pay | Admitting: Internal Medicine

## 2023-04-02 ENCOUNTER — Encounter: Payer: Medicare Other | Admitting: Physical Medicine and Rehabilitation

## 2023-04-02 DIAGNOSIS — M7989 Other specified soft tissue disorders: Secondary | ICD-10-CM

## 2023-04-02 DIAGNOSIS — R002 Palpitations: Secondary | ICD-10-CM

## 2023-04-02 DIAGNOSIS — I10 Essential (primary) hypertension: Secondary | ICD-10-CM

## 2023-04-02 DIAGNOSIS — R5382 Chronic fatigue, unspecified: Secondary | ICD-10-CM

## 2023-04-02 NOTE — Telephone Encounter (Signed)
See result note.   She needs to reschedule her appt with Dr. Gala Romney only, she missed it last month. Reason for visit: hepatomegaly, fatty liver

## 2023-04-02 NOTE — Telephone Encounter (Signed)
Pt called wanting to know the results to recent imaging that she had done. Informed pt that the provider has 5 business days to get results back to the patient. Pt verbalized understanding.

## 2023-04-02 NOTE — Telephone Encounter (Signed)
CT can sometimes overestimate size    Please schedule echo to evaluate

## 2023-04-04 ENCOUNTER — Encounter: Payer: Self-pay | Admitting: Internal Medicine

## 2023-04-07 ENCOUNTER — Encounter (HOSPITAL_COMMUNITY): Payer: Self-pay | Admitting: Physical Therapy

## 2023-04-07 ENCOUNTER — Other Ambulatory Visit: Payer: Self-pay

## 2023-04-07 ENCOUNTER — Ambulatory Visit (HOSPITAL_COMMUNITY): Payer: Medicare Other | Attending: Orthopedic Surgery | Admitting: Physical Therapy

## 2023-04-07 DIAGNOSIS — M5459 Other low back pain: Secondary | ICD-10-CM | POA: Insufficient documentation

## 2023-04-07 DIAGNOSIS — M6281 Muscle weakness (generalized): Secondary | ICD-10-CM | POA: Diagnosis present

## 2023-04-07 DIAGNOSIS — R29898 Other symptoms and signs involving the musculoskeletal system: Secondary | ICD-10-CM | POA: Insufficient documentation

## 2023-04-07 DIAGNOSIS — M5432 Sciatica, left side: Secondary | ICD-10-CM | POA: Diagnosis not present

## 2023-04-07 DIAGNOSIS — R2689 Other abnormalities of gait and mobility: Secondary | ICD-10-CM | POA: Diagnosis present

## 2023-04-07 DIAGNOSIS — E782 Mixed hyperlipidemia: Secondary | ICD-10-CM

## 2023-04-07 MED ORDER — ROSUVASTATIN CALCIUM 10 MG PO TABS: 10.0000 mg | ORAL_TABLET | Freq: Every day | ORAL | 11 refills | Status: DC

## 2023-04-07 NOTE — Therapy (Signed)
OUTPATIENT PHYSICAL THERAPY THORACOLUMBAR EVALUATION   Patient Name: Sydney Taylor MRN: [id number] DOB:December 21, 1966, 57 y.o., female Today's Date: 04/07/2023  END OF SESSION:  PT End of Session - 04/07/23 1034     Visit Number 1    Number of Visits 12    Date for PT Re-Evaluation 05/19/23    Authorization Type [employer] medicare    Progress Note Due on Visit 10    PT Start Time 1034    PT Stop Time 1115    PT Time Calculation (min) 41 min    Activity Tolerance Patient tolerated treatment well    Behavior During Therapy [facility] for tasks assessed/performed             Past Medical History:  Diagnosis Date   Anemia    Anxiety    Arrhythmia    Bipolar depression 12/28/2019   Chronic fatigue    Chronic headaches    Depression    Diabetes mellitus without complication    Fibromyalgia    GERD (gastroesophageal reflux disease)    High cholesterol    HLD (hyperlipidemia)    Hypertension    Insect bite    Migraine    Prediabetes    Sleep apnea    Urinary incontinence    Vasculitis    Past Surgical History:  Procedure Laterality Date   BIOPSY  09/04/2022   Procedure: BIOPSY;  Surgeon: Corbin Ade, MD;  [facility];  Service: Endoscopy;;   CHOLECYSTECTOMY N/A 03/15/2022   Procedure: LAPAROSCOPIC CHOLECYSTECTOMY WITH INTRAOPERATIVE CHOLANGIOGRAM;  Surgeon: Lucretia Roers, MD;  Location: [facility];  Service: General;  Laterality: N/A;   CHOLECYSTECTOMY  02/03/2022   COLONOSCOPY  2017   per patient normal and needs next one in 2027 (Dr. Allena Katz)   COLONOSCOPY WITH PROPOFOL N/A 09/04/2022   Procedure: COLONOSCOPY WITH PROPOFOL;  Surgeon: Corbin Ade, MD;  [facility];  Service: Endoscopy;  Laterality: N/A;   ESOPHAGOGASTRODUODENOSCOPY N/A 05/12/2020   Procedure: ESOPHAGOGASTRODUODENOSCOPY (EGD);  Surgeon: Corbin Ade, MD;  [facility];  Service: Endoscopy;  Laterality: N/A;  11:15am   Joint fusion on foot Right    LIVER BIOPSY N/A  03/15/2022   Procedure: LIVER BIOPSY;  Surgeon: Lucretia Roers, MD;  Location: [facility];  Service: General;  Laterality: N/A;   MALONEY DILATION N/A 05/12/2020   Procedure: Alvy Beal;  Surgeon: Corbin Ade, MD;  [facility];  Service: Endoscopy;  Laterality: N/A;   NASAL SEPTUM SURGERY     POLYPECTOMY  09/04/2022   Procedure: POLYPECTOMY;  Surgeon: Corbin Ade, MD;  [facility];  Service: Endoscopy;;   SINOSCOPY     Patient Active Problem List   Diagnosis Date Noted   Abnormal EKG 02/10/2023   Epigastric pain determined by examination 02/10/2023   Acute cough 02/10/2023   Acute right-sided thoracic back pain 01/30/2023   Myofascial pain dysfunction syndrome 01/27/2023   Chronic fatigue 10/07/2022   Cutaneous vasculitis 09/23/2022   Diarrhea 07/30/2022   Rash and nonspecific skin eruption 06/07/2022   Essential hypertension 06/03/2022   Fatty liver 03/05/2022   Gallstone pancreatitis 03/04/2022   Encounter for general adult medical examination with abnormal findings 02/01/2022   Plantar fasciitis 02/01/2022   Chest pain 01/14/2022   Chronic migraine with aura 10/29/2021   Diabetic polyneuropathy associated with type 2 diabetes mellitus 09/19/2021   History of hormone therapy 09/19/2021   Bilateral primary osteoarthritis of knee 04/10/2021  Perennial allergic rhinitis 04/06/2021   Allergic contact dermatitis 04/06/2021   Cholinergic urticaria 04/06/2021   Fibromyalgia syndrome 01/12/2021   Elevated C-reactive protein (CRP) 01/12/2021   Diverticulosis of colon without hemorrhage 01/02/2021   Major depressive disorder, recurrent 07/14/2020   GERD (gastroesophageal reflux disease) 05/11/2020   Type 2 diabetes mellitus 03/27/2020   Bipolar depression 12/28/2019   Sleep apnea 12/28/2019   Tachycardia 10/31/2019   Hyperlipidemia 10/30/2015   Morbid obesity 10/30/2015    PCP: Trena Platt MD  REFERRING PROVIDER: Oliver Barre,  MD  REFERRING DIAG: [phone] (ICD-10-CM) - Sciatica, left side  Rationale for Evaluation and Treatment: Rehabilitation  THERAPY DIAG:  Other low back pain  Muscle weakness (generalized)  Other abnormalities of gait and mobility  Other symptoms and signs involving the musculoskeletal system  ONSET DATE: 1 year  SUBJECTIVE:                                                                                                                                                                                           SUBJECTIVE STATEMENT: Patient states L leg symptoms, pain and numbness starting at L hip and around front and side to knee area. She has 2 herniated discs at L4, L5. Symptoms began about a year ago with insidious onset. Patient notes increase in symptoms with standing too long and with shopping. Symptoms decrease with laying down. Seems to be a difference in opinion as to the issue with either DDD or a hip issue. Symptoms come and go.   PERTINENT HISTORY:  Anxiety, Bipolar Depression, chronic fatigue, fibromyalgia, HTN, HLD  PAIN:  Are you having pain? Yes: NPRS scale: 4/10 Pain location: generalized Pain description: aching Aggravating factors: initial movement Relieving factors: continued movement  PRECAUTIONS: None  WEIGHT BEARING RESTRICTIONS: No  FALLS:  Has patient fallen in last 6 months? No  OCCUPATION: Disability  PLOF: Independent  PATIENT GOALS: to not have a sharp, horrible pain in thigh  NEXT MD VISIT: none scheduled  OBJECTIVE:   DIAGNOSTIC FINDINGS:  XR 11/25/22 Impression: Lumbar spine with diffuse degenerative changes.   PATIENT SURVEYS: FOTO 40% function  SCREENING FOR RED FLAGS: Bowel or bladder incontinence: No Spinal tumors: No Cauda equina syndrome: No Compression fracture: No Abdominal aneurysm: No  COGNITION: Overall cognitive status: Within functional limits for tasks assessed     SENSATION:WFL  POSTURE: rounded shoulders,  forward head, decreased lumbar lordosis, and increased thoracic kyphosis  PALPATION: Grossly hypomobile thoracic and lumbar spine, general soreness lumbar paraspinals, glutes bilaterally - not concordant  LUMBAR ROM:   AROM eval  Flexion 25% limited *   Extension 25% limited  Right lateral flexion 25% limited  Left lateral flexion 0% limited  Right rotation 0% limited  Left rotation 0% limited   (Blank rows = not tested) *= symptoms  LOWER EXTREMITY ROM:   WFL for tasks assessed  Active  Right eval Left eval  Hip flexion    Hip extension    Hip abduction    Hip adduction    Hip internal rotation    Hip external rotation    Knee flexion    Knee extension    Ankle dorsiflexion    Ankle plantarflexion    Ankle inversion    Ankle eversion     (Blank rows = not tested)  LOWER EXTREMITY MMT:    MMT Right eval Left eval  Hip flexion 5 4+  Hip extension 4+ 4  Hip abduction 4+ 4+  Hip adduction    Hip internal rotation    Hip external rotation    Knee flexion 5 5  Knee extension 5 5  Ankle dorsiflexion 5 5  Ankle plantarflexion    Ankle inversion    Ankle eversion     (Blank rows = not tested)  LUMBAR SPECIAL TESTS:  Femoral nerve tension test L negative  FUNCTIONAL TESTS:  5 times sit to stand: 19.84 seconds without UE support 2 minute walk test: 430 feet   GAIT: Distance walked: 430 feet Assistive device utilized: None Level of assistance: Complete Independence Comments: , mild decreased L hip extension  TODAY'S TREATMENT:                                                                                                                              DATE:  04/07/23 Standing lumbar extension 1 x 10     PATIENT EDUCATION:  Education details: Patient educated on exam findings, POC, scope of PT, HEP, and dosing of exercises. Person educated: Patient Education method: Explanation, Demonstration, and Handouts Education comprehension: verbalized  understanding, returned demonstration, verbal cues required, and tactile cues required  HOME EXERCISE PROGRAM: Access Code: [email or web address] URL: [email or web address] Date: 04/07/2023 Prepared by: Greig Castilla Rodrigo Mcgranahan  Exercises - Standing Lumbar Extension  - 5 x daily - 7 x weekly - 1-2 sets - 10 reps  ASSESSMENT:  CLINICAL IMPRESSION: Patient a 57 y.o. y.o. female who was seen today for physical therapy evaluation and treatment for L sided sciatica. Patient presents with pain limited deficits in lumbar and LE strength, ROM, endurance, activity tolerance, and functional mobility with ADL. Patient is having to modify and restrict ADL as indicated by outcome measure score as well as subjective information and objective measures which is affecting overall participation. Patient without reproduction of symptoms other than minimal change with trunk flexion, extension felt good. Will address hip and core weakness. Patient will benefit from skilled physical therapy in order to improve function and reduce impairment.  OBJECTIVE IMPAIRMENTS: Abnormal gait, decreased activity tolerance, decreased balance, decreased endurance, decreased mobility, difficulty walking, decreased ROM, decreased strength, increased muscle  spasms, impaired flexibility, improper body mechanics, postural dysfunction, and pain.   ACTIVITY LIMITATIONS: carrying, lifting, bending, standing, squatting, stairs, transfers, locomotion level, and caring for others  PARTICIPATION LIMITATIONS: meal prep, cleaning, laundry, shopping, community activity, and yard work  PERSONAL FACTORS: Fitness, Time since onset of injury/illness/exacerbation, and 3+ comorbidities: Anxiety, Bipolar Depression, chronic fatigue, fibromyalgia, HTN, HLD  are also affecting patient's functional outcome.   REHAB POTENTIAL: Good  CLINICAL DECISION MAKING: Stable/uncomplicated  EVALUATION COMPLEXITY: Low   GOALS: Goals reviewed with patient?  Yes  SHORT TERM GOALS: Target date: 04/28/2023    Patient will be independent with HEP in order to improve functional outcomes. Baseline:  Goal status: INITIAL  2.  Patient will report at least 25% improvement in symptoms for improved quality of life. Baseline:  Goal status: INITIAL   LONG TERM GOALS: Target date: 05/19/2023    Patient will report at least 75% improvement in symptoms for improved quality of life. Baseline:  Goal status: INITIAL  2.  Patient will improve FOTO score to predicted outcomes in order to indicate improved tolerance to activity. Baseline: 40% function Goal status: INITIAL  3.  Patient will demonstrate grade of 5/5 MMT grade in all tested musculature as evidence of improved strength to assist with stair ambulation and gait.   Baseline: see MMT Goal status: INITIAL  4.  Patient will report ability to go grocery shopping without increase in symptoms for improved ability to complete community ambulation. Baseline:  Goal status: INITIAL     PLAN:  PT FREQUENCY: 2x/week  PT DURATION: 6 weeks  PLANNED INTERVENTIONS: Therapeutic exercises, Therapeutic activity, Neuromuscular re-education, Balance training, Gait training, Patient/Family education, Joint manipulation, Joint mobilization, Stair training, Orthotic/Fit training, DME instructions, Aquatic Therapy, Dry Needling, Electrical stimulation, Spinal manipulation, Spinal mobilization, Cryotherapy, Moist heat, Compression bandaging, scar mobilization, Splintting, Taping, Traction, Ultrasound, Ionotophoresis 4mg /ml Dexamethasone, and Manual therapy  PLAN FOR NEXT SESSION: core and hip strength, postural strength   Reola MosherAndrew S Vasilisa Vore, PT 04/07/2023, 12:27 PM

## 2023-04-09 ENCOUNTER — Other Ambulatory Visit: Payer: Self-pay | Admitting: Gastroenterology

## 2023-04-09 ENCOUNTER — Ambulatory Visit (HOSPITAL_COMMUNITY): Payer: Medicare Other | Admitting: Physical Therapy

## 2023-04-09 ENCOUNTER — Other Ambulatory Visit: Payer: Self-pay

## 2023-04-09 ENCOUNTER — Encounter (HOSPITAL_COMMUNITY): Payer: Self-pay | Admitting: Physical Therapy

## 2023-04-09 DIAGNOSIS — M6281 Muscle weakness (generalized): Secondary | ICD-10-CM

## 2023-04-09 DIAGNOSIS — M5459 Other low back pain: Secondary | ICD-10-CM | POA: Diagnosis not present

## 2023-04-09 DIAGNOSIS — R2689 Other abnormalities of gait and mobility: Secondary | ICD-10-CM

## 2023-04-09 DIAGNOSIS — R29898 Other symptoms and signs involving the musculoskeletal system: Secondary | ICD-10-CM

## 2023-04-09 MED ORDER — DICYCLOMINE HCL 10 MG PO CAPS: 10.0000 mg | ORAL_CAPSULE | Freq: Four times a day (QID) | ORAL | 0 refills | Status: DC

## 2023-04-09 NOTE — Therapy (Signed)
OUTPATIENT PHYSICAL THERAPY TREATMENT  Patient Name: IRIS HAIRSTON MRN: [id number] DOB:Apr 05, 1966, 57 y.o., female Today's Date: 04/09/2023  END OF SESSION:  PT End of Session - 04/09/23 1117     Visit Number 2    Number of Visits 12    Date for PT Re-Evaluation 05/19/23    Authorization Type [employer] medicare    Progress Note Due on Visit 10    PT Start Time 1117    PT Stop Time 1155    PT Time Calculation (min) 38 min    Activity Tolerance Patient tolerated treatment well    Behavior During Therapy [facility] for tasks assessed/performed             Past Medical History:  Diagnosis Date   Anemia    Anxiety    Arrhythmia    Bipolar depression 12/28/2019   Chronic fatigue    Chronic headaches    Depression    Diabetes mellitus without complication    Fibromyalgia    GERD (gastroesophageal reflux disease)    High cholesterol    HLD (hyperlipidemia)    Hypertension    Insect bite    Migraine    Prediabetes    Sleep apnea    Urinary incontinence    Vasculitis    Past Surgical History:  Procedure Laterality Date   BIOPSY  09/04/2022   Procedure: BIOPSY;  Surgeon: Corbin Ade, MD;  [facility];  Service: Endoscopy;;   CHOLECYSTECTOMY N/A 03/15/2022   Procedure: LAPAROSCOPIC CHOLECYSTECTOMY WITH INTRAOPERATIVE CHOLANGIOGRAM;  Surgeon: Lucretia Roers, MD;  Location: [facility];  Service: General;  Laterality: N/A;   CHOLECYSTECTOMY  02/03/2022   COLONOSCOPY  2017   per patient normal and needs next one in 2027 (Dr. Allena Katz)   COLONOSCOPY WITH PROPOFOL N/A 09/04/2022   Procedure: COLONOSCOPY WITH PROPOFOL;  Surgeon: Corbin Ade, MD;  [facility];  Service: Endoscopy;  Laterality: N/A;   ESOPHAGOGASTRODUODENOSCOPY N/A 05/12/2020   Procedure: ESOPHAGOGASTRODUODENOSCOPY (EGD);  Surgeon: Corbin Ade, MD;  [facility];  Service: Endoscopy;  Laterality: N/A;  11:15am   Joint fusion on foot Right    LIVER BIOPSY N/A 03/15/2022    Procedure: LIVER BIOPSY;  Surgeon: Lucretia Roers, MD;  Location: [facility];  Service: General;  Laterality: N/A;   MALONEY DILATION N/A 05/12/2020   Procedure: Alvy Beal;  Surgeon: Corbin Ade, MD;  [facility];  Service: Endoscopy;  Laterality: N/A;   NASAL SEPTUM SURGERY     POLYPECTOMY  09/04/2022   Procedure: POLYPECTOMY;  Surgeon: Corbin Ade, MD;  [facility];  Service: Endoscopy;;   SINOSCOPY     Patient Active Problem List   Diagnosis Date Noted   Abnormal EKG 02/10/2023   Epigastric pain determined by examination 02/10/2023   Acute cough 02/10/2023   Acute right-sided thoracic back pain 01/30/2023   Myofascial pain dysfunction syndrome 01/27/2023   Chronic fatigue 10/07/2022   Cutaneous vasculitis 09/23/2022   Diarrhea 07/30/2022   Rash and nonspecific skin eruption 06/07/2022   Essential hypertension 06/03/2022   Fatty liver 03/05/2022   Gallstone pancreatitis 03/04/2022   Encounter for general adult medical examination with abnormal findings 02/01/2022   Plantar fasciitis 02/01/2022   Chest pain 01/14/2022   Chronic migraine with aura 10/29/2021   Diabetic polyneuropathy associated with type 2 diabetes mellitus 09/19/2021   History of hormone therapy 09/19/2021   Bilateral primary osteoarthritis of knee 04/10/2021   Perennial  allergic rhinitis 04/06/2021   Allergic contact dermatitis 04/06/2021   Cholinergic urticaria 04/06/2021   Fibromyalgia syndrome 01/12/2021   Elevated C-reactive protein (CRP) 01/12/2021   Diverticulosis of colon without hemorrhage 01/02/2021   Major depressive disorder, recurrent 07/14/2020   GERD (gastroesophageal reflux disease) 05/11/2020   Type 2 diabetes mellitus 03/27/2020   Bipolar depression 12/28/2019   Sleep apnea 12/28/2019   Tachycardia 10/31/2019   Hyperlipidemia 10/30/2015   Morbid obesity 10/30/2015    PCP: Trena Plattutwik Patel MD  REFERRING PROVIDER: Oliver Barreairns, Mark A, MD  REFERRING DIAG:  [phone] (ICD-10-CM) - Sciatica, left side  Rationale for Evaluation and Treatment: Rehabilitation  THERAPY DIAG:  Other low back pain  Muscle weakness (generalized)  Other abnormalities of gait and mobility  Other symptoms and signs involving the musculoskeletal system  ONSET DATE: 1 year  SUBJECTIVE:                                                                                                                                                                                           SUBJECTIVE STATEMENT: Patient states her back and leg are doing alright. Some slight numbness all the time. Has not done anything to aggravate it since last time. HEP went well, no change since beginning and has completed every few hours.   PERTINENT HISTORY:  Anxiety, Bipolar Depression, chronic fatigue, fibromyalgia, HTN, HLD  PAIN:  Are you having pain? Yes: NPRS scale: 5/10 Pain location: generalized Pain description: aching Aggravating factors: initial movement Relieving factors: continued movement  PRECAUTIONS: None  WEIGHT BEARING RESTRICTIONS: No  FALLS:  Has patient fallen in last 6 months? No  OCCUPATION: Disability  PLOF: Independent  PATIENT GOALS: to not have a sharp, horrible pain in thigh  NEXT MD VISIT: none scheduled  OBJECTIVE:   DIAGNOSTIC FINDINGS:  XR 11/25/22 Impression: Lumbar spine with diffuse degenerative changes.   PATIENT SURVEYS: FOTO 40% function  SCREENING FOR RED FLAGS: Bowel or bladder incontinence: No Spinal tumors: No Cauda equina syndrome: No Compression fracture: No Abdominal aneurysm: No  COGNITION: Overall cognitive status: Within functional limits for tasks assessed     SENSATION:WFL  POSTURE: rounded shoulders, forward head, decreased lumbar lordosis, and increased thoracic kyphosis  PALPATION: Grossly hypomobile thoracic and lumbar spine, general soreness lumbar paraspinals, glutes bilaterally - not concordant  LUMBAR ROM:    AROM eval  Flexion 25% limited *   Extension 25% limited  Right lateral flexion 25% limited  Left lateral flexion 0% limited  Right rotation 0% limited  Left rotation 0% limited   (Blank rows = not tested) *= symptoms  LOWER EXTREMITY ROM:   WFL for  tasks assessed  Active  Right eval Left eval  Hip flexion    Hip extension    Hip abduction    Hip adduction    Hip internal rotation    Hip external rotation    Knee flexion    Knee extension    Ankle dorsiflexion    Ankle plantarflexion    Ankle inversion    Ankle eversion     (Blank rows = not tested)  LOWER EXTREMITY MMT:    MMT Right eval Left eval  Hip flexion 5 4+  Hip extension 4+ 4  Hip abduction 4+ 4+  Hip adduction    Hip internal rotation    Hip external rotation    Knee flexion 5 5  Knee extension 5 5  Ankle dorsiflexion 5 5  Ankle plantarflexion    Ankle inversion    Ankle eversion     (Blank rows = not tested)  LUMBAR SPECIAL TESTS:  Femoral nerve tension test L negative  FUNCTIONAL TESTS:  5 times sit to stand: 19.84 seconds without UE support 2 minute walk test: 430 feet   GAIT: Distance walked: 430 feet Assistive device utilized: None Level of assistance: Complete Independence Comments: , mild decreased L hip extension  TODAY'S TREATMENT:                                                                                                                              DATE:  04/09/23 Standing lumbar extension 1 x 10   POE 2 x 1 minute Prone press up 1 x 10  Prone hip extension 2 x 10 bilateral Bridge 2 x 10 Ab set 10 x 5 second holds March with ab set 2 x 10  Sidelying hip abduction 2 x 10 bilateral   04/07/23 Standing lumbar extension 1 x 10     PATIENT EDUCATION:  Education details:04/09/23: HEP; Eval: Patient educated on exam findings, POC, scope of PT, HEP, and dosing of exercises. Person educated: Patient Education method: Explanation, Demonstration, and Handouts Education  comprehension: verbalized understanding, returned demonstration, verbal cues required, and tactile cues required  HOME EXERCISE PROGRAM: Access Code: [email or web address] URL: [email or web address]  04/09/23 Prone Hip Extension  - 1 x daily - 7 x weekly - 2 sets - 10 reps - Prone Press Up On Elbows  - 1 x daily - 7 x weekly - 2 reps - 60 second hold - Prone Press Up  - 1 x daily - 7 x weekly - 2 sets - 10 reps - Bridge  - 1 x daily - 7 x weekly - 2 sets - 10 reps - Abdominal Bracing  - 1 x daily - 7 x weekly - 10 reps - 5 second hold - Supine March  - 1 x daily - 7 x weekly - 2 sets - 10 reps - Sidelying Hip Abduction  - 1 x daily - 7 x weekly - 2 sets - 10 reps  Date: 04/07/2023 -  Standing Lumbar Extension  - 5 x daily - 7 x weekly - 1-2 sets - 10 reps  ASSESSMENT:  CLINICAL IMPRESSION: Continued with trial of extension based exercises with no change in radicular symptoms. UE fatigue noted with press ups and decreased extension ROM with fatigue. Began additional glute and core strengthening which are tolerated well. Limited hip extension ROM with prone hip extension exercise. Good mechanics with exercises following initial cueing. Patient will continue to benefit from physical therapy in order to improve function and reduce impairment.   OBJECTIVE IMPAIRMENTS: Abnormal gait, decreased activity tolerance, decreased balance, decreased endurance, decreased mobility, difficulty walking, decreased ROM, decreased strength, increased muscle spasms, impaired flexibility, improper body mechanics, postural dysfunction, and pain.   ACTIVITY LIMITATIONS: carrying, lifting, bending, standing, squatting, stairs, transfers, locomotion level, and caring for others  PARTICIPATION LIMITATIONS: meal prep, cleaning, laundry, shopping, community activity, and yard work  PERSONAL FACTORS: Fitness, Time since onset of injury/illness/exacerbation, and 3+ comorbidities: Anxiety, Bipolar Depression, chronic  fatigue, fibromyalgia, HTN, HLD  are also affecting patient's functional outcome.   REHAB POTENTIAL: Good  CLINICAL DECISION MAKING: Stable/uncomplicated  EVALUATION COMPLEXITY: Low   GOALS: Goals reviewed with patient? Yes  SHORT TERM GOALS: Target date: 04/28/2023    Patient will be independent with HEP in order to improve functional outcomes. Baseline:  Goal status: INITIAL  2.  Patient will report at least 25% improvement in symptoms for improved quality of life. Baseline:  Goal status: INITIAL   LONG TERM GOALS: Target date: 05/19/2023    Patient will report at least 75% improvement in symptoms for improved quality of life. Baseline:  Goal status: INITIAL  2.  Patient will improve FOTO score to predicted outcomes in order to indicate improved tolerance to activity. Baseline: 40% function Goal status: INITIAL  3.  Patient will demonstrate grade of 5/5 MMT grade in all tested musculature as evidence of improved strength to assist with stair ambulation and gait.   Baseline: see MMT Goal status: INITIAL  4.  Patient will report ability to go grocery shopping without increase in symptoms for improved ability to complete community ambulation. Baseline:  Goal status: INITIAL     PLAN:  PT FREQUENCY: 2x/week  PT DURATION: 6 weeks  PLANNED INTERVENTIONS: Therapeutic exercises, Therapeutic activity, Neuromuscular re-education, Balance training, Gait training, Patient/Family education, Joint manipulation, Joint mobilization, Stair training, Orthotic/Fit training, DME instructions, Aquatic Therapy, Dry Needling, Electrical stimulation, Spinal manipulation, Spinal mobilization, Cryotherapy, Moist heat, Compression bandaging, scar mobilization, Splintting, Taping, Traction, Ultrasound, Ionotophoresis 4mg /ml Dexamethasone, and Manual therapy  PLAN FOR NEXT SESSION: f/u with HEP; core and hip strength, postural strength, assess what size swiss ball patient needs   Wyman Songster, PT 04/09/2023, 11:18 AM

## 2023-04-11 LAB — POCT I-STAT CREATININE: Creatinine, Ser: 1.1 mg/dL — ABNORMAL HIGH (ref 0.44–1.00)

## 2023-04-14 ENCOUNTER — Encounter: Payer: Medicare Other | Admitting: Physical Medicine and Rehabilitation

## 2023-04-14 ENCOUNTER — Encounter (HOSPITAL_COMMUNITY): Payer: Medicare Other | Admitting: Physical Therapy

## 2023-04-16 ENCOUNTER — Encounter (HOSPITAL_COMMUNITY): Payer: Self-pay | Admitting: Physical Therapy

## 2023-04-16 ENCOUNTER — Ambulatory Visit (HOSPITAL_COMMUNITY): Payer: Medicare Other | Admitting: Physical Therapy

## 2023-04-16 ENCOUNTER — Encounter (HOSPITAL_COMMUNITY): Payer: Medicare Other | Admitting: Physical Therapy

## 2023-04-16 DIAGNOSIS — M6281 Muscle weakness (generalized): Secondary | ICD-10-CM

## 2023-04-16 DIAGNOSIS — M5459 Other low back pain: Secondary | ICD-10-CM | POA: Diagnosis not present

## 2023-04-16 DIAGNOSIS — R2689 Other abnormalities of gait and mobility: Secondary | ICD-10-CM

## 2023-04-16 DIAGNOSIS — R29898 Other symptoms and signs involving the musculoskeletal system: Secondary | ICD-10-CM

## 2023-04-16 NOTE — Therapy (Signed)
OUTPATIENT PHYSICAL THERAPY TREATMENT  Patient Name: Sydney Taylor MRN: [id number] DOB:1966/02/12, 57 y.o., female Today's Date: 04/16/2023  END OF SESSION:  PT End of Session - 04/16/23 1303     Visit Number 3    Number of Visits 12    Date for PT Re-Evaluation 05/19/23    Authorization Type [employer] medicare    Progress Note Due on Visit 10    PT Start Time 1303    PT Stop Time 1342    PT Time Calculation (min) 39 min    Activity Tolerance Patient tolerated treatment well    Behavior During Therapy [facility] for tasks assessed/performed             Past Medical History:  Diagnosis Date   Anemia    Anxiety    Arrhythmia    Bipolar depression 12/28/2019   Chronic fatigue    Chronic headaches    Depression    Diabetes mellitus without complication    Fibromyalgia    GERD (gastroesophageal reflux disease)    High cholesterol    HLD (hyperlipidemia)    Hypertension    Insect bite    Migraine    Prediabetes    Sleep apnea    Urinary incontinence    Vasculitis    Past Surgical History:  Procedure Laterality Date   BIOPSY  09/04/2022   Procedure: BIOPSY;  Surgeon: Corbin Ade, MD;  Location: [facility];  Service: Endoscopy;;   CHOLECYSTECTOMY N/A 03/15/2022   Procedure: LAPAROSCOPIC CHOLECYSTECTOMY WITH INTRAOPERATIVE CHOLANGIOGRAM;  Surgeon: Lucretia Roers, MD;  Location: [facility];  Service: General;  Laterality: N/A;   CHOLECYSTECTOMY  02/03/2022   COLONOSCOPY  2017   per patient normal and needs next one in 2027 (Dr. Allena Katz)   COLONOSCOPY WITH PROPOFOL N/A 09/04/2022   Procedure: COLONOSCOPY WITH PROPOFOL;  Surgeon: Corbin Ade, MD;  Location: [facility];  Service: Endoscopy;  Laterality: N/A;   ESOPHAGOGASTRODUODENOSCOPY N/A 05/12/2020   Procedure: ESOPHAGOGASTRODUODENOSCOPY (EGD);  Surgeon: Corbin Ade, MD;  Location: [facility];  Service: Endoscopy;  Laterality: N/A;  11:15am   Joint fusion on foot Right    LIVER BIOPSY N/A 03/15/2022    Procedure: LIVER BIOPSY;  Surgeon: Lucretia Roers, MD;  Location: [facility];  Service: General;  Laterality: N/A;   MALONEY DILATION N/A 05/12/2020   Procedure: Alvy Beal;  Surgeon: Corbin Ade, MD;  Location: [facility];  Service: Endoscopy;  Laterality: N/A;   NASAL SEPTUM SURGERY     POLYPECTOMY  09/04/2022   Procedure: POLYPECTOMY;  Surgeon: Corbin Ade, MD;  Location: [facility];  Service: Endoscopy;;   SINOSCOPY     Patient Active Problem List   Diagnosis Date Noted   Abnormal EKG 02/10/2023   Epigastric pain determined by examination 02/10/2023   Acute cough 02/10/2023   Acute right-sided thoracic back pain 01/30/2023   Myofascial pain dysfunction syndrome 01/27/2023   Chronic fatigue 10/07/2022   Cutaneous vasculitis 09/23/2022   Diarrhea 07/30/2022   Rash and nonspecific skin eruption 06/07/2022   Essential hypertension 06/03/2022   Fatty liver 03/05/2022   Gallstone pancreatitis 03/04/2022   Encounter for general adult medical examination with abnormal findings 02/01/2022   Plantar fasciitis 02/01/2022   Chest pain 01/14/2022   Chronic migraine with aura 10/29/2021   Diabetic polyneuropathy associated with type 2 diabetes mellitus 09/19/2021   History of hormone therapy 09/19/2021   Bilateral primary osteoarthritis of knee 04/10/2021   Perennial  allergic rhinitis 04/06/2021   Allergic contact dermatitis 04/06/2021   Cholinergic urticaria 04/06/2021   Fibromyalgia syndrome 01/12/2021   Elevated C-reactive protein (CRP) 01/12/2021   Diverticulosis of colon without hemorrhage 01/02/2021   Major depressive disorder, recurrent 07/14/2020   GERD (gastroesophageal reflux disease) 05/11/2020   Type 2 diabetes mellitus 03/27/2020   Bipolar depression 12/28/2019   Sleep apnea 12/28/2019   Tachycardia 10/31/2019   Hyperlipidemia 10/30/2015   Morbid obesity 10/30/2015    PCP: Trena Platt MD  REFERRING PROVIDER: Oliver Barre, MD  REFERRING DIAG:  [phone] (ICD-10-CM) - Sciatica, left side  Rationale for Evaluation and Treatment: Rehabilitation  THERAPY DIAG:  No diagnosis found.  ONSET DATE: 1 year  SUBJECTIVE:                                                                                                                                                                                           SUBJECTIVE STATEMENT: Patient states usual symptoms into LLE, muscle spasms in mid back that she notes with rolling in bed. Maybe a little better in leg.   PERTINENT HISTORY:  Anxiety, Bipolar Depression, chronic fatigue, fibromyalgia, HTN, HLD  PAIN:  Are you having pain? Yes: NPRS scale: 4/10 Pain location: generalized Pain description: aching Aggravating factors: initial movement Relieving factors: continued movement  PRECAUTIONS: None  WEIGHT BEARING RESTRICTIONS: No  FALLS:  Has patient fallen in last 6 months? No  OCCUPATION: Disability  PLOF: Independent  PATIENT GOALS: to not have a sharp, horrible pain in thigh  NEXT MD VISIT: none scheduled  OBJECTIVE:   DIAGNOSTIC FINDINGS:  XR 11/25/22 Impression: Lumbar spine with diffuse degenerative changes.   PATIENT SURVEYS: FOTO 40% function  SCREENING FOR RED FLAGS: Bowel or bladder incontinence: No Spinal tumors: No Cauda equina syndrome: No Compression fracture: No Abdominal aneurysm: No  COGNITION: Overall cognitive status: Within functional limits for tasks assessed     SENSATION:WFL  POSTURE: rounded shoulders, forward head, decreased lumbar lordosis, and increased thoracic kyphosis  PALPATION: Grossly hypomobile thoracic and lumbar spine, general soreness lumbar paraspinals, glutes bilaterally - not concordant  LUMBAR ROM:   AROM eval  Flexion 25% limited *   Extension 25% limited  Right lateral flexion 25% limited  Left lateral flexion 0% limited  Right rotation 0% limited  Left rotation 0% limited   (Blank rows = not tested) *=  symptoms  LOWER EXTREMITY ROM:   WFL for tasks assessed  Active  Right eval Left eval  Hip flexion    Hip extension    Hip abduction    Hip adduction    Hip internal rotation  Hip external rotation    Knee flexion    Knee extension    Ankle dorsiflexion    Ankle plantarflexion    Ankle inversion    Ankle eversion     (Blank rows = not tested)  LOWER EXTREMITY MMT:    MMT Right eval Left eval  Hip flexion 5 4+  Hip extension 4+ 4  Hip abduction 4+ 4+  Hip adduction    Hip internal rotation    Hip external rotation    Knee flexion 5 5  Knee extension 5 5  Ankle dorsiflexion 5 5  Ankle plantarflexion    Ankle inversion    Ankle eversion     (Blank rows = not tested)  LUMBAR SPECIAL TESTS:  Femoral nerve tension test L negative  FUNCTIONAL TESTS:  5 times sit to stand: 19.84 seconds without UE support 2 minute walk test: 430 feet   GAIT: Distance walked: 430 feet Assistive device utilized: None Level of assistance: Complete Independence Comments: , mild decreased L hip extension  TODAY'S TREATMENT:                                                                                                                              DATE:  04/16/23 Swiss ball sizing of 85 cm Seated ball heel raises with core activation 2 x 10 bilateral  Seated ball march 1 x 10 bilateral  Seated ball LAQ 1 x 10 bilateral  Seated heel raise with UE flexion 1 x 10 bilateral Self STM with tennis ball education and performance  Row GTB 2 x 10  Shoulder extension GTB 2 x 10   04/09/23 Standing lumbar extension 1 x 10   POE 2 x 1 minute Prone press up 1 x 10  Prone hip extension 2 x 10 bilateral Bridge 2 x 10 Ab set 10 x 5 second holds March with ab set 2 x 10  Sidelying hip abduction 2 x 10 bilateral   04/07/23 Standing lumbar extension 1 x 10     PATIENT EDUCATION:  Education details:04/09/23: HEP; Eval: Patient educated on exam findings, POC, scope of PT, HEP, and dosing  of exercises. Person educated: Patient Education method: Explanation, Demonstration, and Handouts Education comprehension: verbalized understanding, returned demonstration, verbal cues required, and tactile cues required  HOME EXERCISE PROGRAM: Access Code: [email or web address] URL: [email or web address] 04/16/23 - Swiss Ball March  - 1 x daily - 7 x weekly - 2 sets - 10 reps - Swiss Ball Knee Extension  - 1 x daily - 7 x weekly - 2 sets - 10 reps - Seated March with Opposite Arm Flexion on Swiss Ball  - 1 x daily - 7 x weekly - 2 sets - 10 reps  04/09/23 Prone Hip Extension  - 1 x daily - 7 x weekly - 2 sets - 10 reps - Prone Press Up On Elbows  - 1 x daily - 7 x weekly - 2 reps - 60  second hold - Prone Press Up  - 1 x daily - 7 x weekly - 2 sets - 10 reps - Bridge  - 1 x daily - 7 x weekly - 2 sets - 10 reps - Abdominal Bracing  - 1 x daily - 7 x weekly - 10 reps - 5 second hold - Supine March  - 1 x daily - 7 x weekly - 2 sets - 10 reps - Sidelying Hip Abduction  - 1 x daily - 7 x weekly - 2 sets - 10 reps  Date: 04/07/2023 - Standing Lumbar Extension  - 5 x daily - 7 x weekly - 1-2 sets - 10 reps  ASSESSMENT:  CLINICAL IMPRESSION: Trialed different size of swiss ball with patient so she knew what size she could purchase. Patient with better positioning and posture on 85 cm ball. Performed core strengthening seated on ball and began postural strengthening with resistance. Added ball exercises to HeP as patient wants to get ball today. Patient with question of special cane for STM, educated on use of SPC with crook or tennis ball for STM if desired. Patient able to use ball well following demonstration. Patient will continue to benefit from physical therapy in order to improve function and reduce impairment.   OBJECTIVE IMPAIRMENTS: Abnormal gait, decreased activity tolerance, decreased balance, decreased endurance, decreased mobility, difficulty walking, decreased ROM, decreased  strength, increased muscle spasms, impaired flexibility, improper body mechanics, postural dysfunction, and pain.   ACTIVITY LIMITATIONS: carrying, lifting, bending, standing, squatting, stairs, transfers, locomotion level, and caring for others  PARTICIPATION LIMITATIONS: meal prep, cleaning, laundry, shopping, community activity, and yard work  PERSONAL FACTORS: Fitness, Time since onset of injury/illness/exacerbation, and 3+ comorbidities: Anxiety, Bipolar Depression, chronic fatigue, fibromyalgia, HTN, HLD  are also affecting patient's functional outcome.   REHAB POTENTIAL: Good  CLINICAL DECISION MAKING: Stable/uncomplicated  EVALUATION COMPLEXITY: Low   GOALS: Goals reviewed with patient? Yes  SHORT TERM GOALS: Target date: 04/28/2023    Patient will be independent with HEP in order to improve functional outcomes. Baseline:  Goal status: INITIAL  2.  Patient will report at least 25% improvement in symptoms for improved quality of life. Baseline:  Goal status: INITIAL   LONG TERM GOALS: Target date: 05/19/2023    Patient will report at least 75% improvement in symptoms for improved quality of life. Baseline:  Goal status: INITIAL  2.  Patient will improve FOTO score to predicted outcomes in order to indicate improved tolerance to activity. Baseline: 40% function Goal status: INITIAL  3.  Patient will demonstrate grade of 5/5 MMT grade in all tested musculature as evidence of improved strength to assist with stair ambulation and gait.   Baseline: see MMT Goal status: INITIAL  4.  Patient will report ability to go grocery shopping without increase in symptoms for improved ability to complete community ambulation. Baseline:  Goal status: INITIAL     PLAN:  PT FREQUENCY: 2x/week  PT DURATION: 6 weeks  PLANNED INTERVENTIONS: Therapeutic exercises, Therapeutic activity, Neuromuscular re-education, Balance training, Gait training, Patient/Family education, Joint  manipulation, Joint mobilization, Stair training, Orthotic/Fit training, DME instructions, Aquatic Therapy, Dry Needling, Electrical stimulation, Spinal manipulation, Spinal mobilization, Cryotherapy, Moist heat, Compression bandaging, scar mobilization, Splintting, Taping, Traction, Ultrasound, Ionotophoresis 4mg /ml Dexamethasone, and Manual therapy  PLAN FOR NEXT SESSION: f/u with HEP; core and hip strength, postural strength,   Reola Mosher Jaziah Kwasnik, PT 04/16/2023, 1:03 PM

## 2023-04-21 ENCOUNTER — Encounter (HOSPITAL_COMMUNITY): Payer: Medicare Other | Admitting: Physical Therapy

## 2023-04-23 ENCOUNTER — Encounter (HOSPITAL_COMMUNITY): Payer: Medicare Other | Admitting: Physical Therapy

## 2023-04-25 ENCOUNTER — Encounter
Payer: Medicare Other | Attending: Physical Medicine and Rehabilitation | Admitting: Physical Medicine and Rehabilitation

## 2023-04-25 ENCOUNTER — Encounter: Payer: Self-pay | Admitting: Physical Medicine and Rehabilitation

## 2023-04-25 VITALS — BP 132/85 | HR 102 | Ht 70.0 in | Wt 276.0 lb

## 2023-04-25 DIAGNOSIS — R5382 Chronic fatigue, unspecified: Secondary | ICD-10-CM

## 2023-04-25 DIAGNOSIS — M7918 Myalgia, other site: Secondary | ICD-10-CM

## 2023-04-25 DIAGNOSIS — M797 Fibromyalgia: Secondary | ICD-10-CM | POA: Diagnosis present

## 2023-04-25 MED ORDER — LIDOCAINE HCL 1 % IJ SOLN
9.0000 mL | Freq: Once | INTRAMUSCULAR | Status: AC
Start: 2023-04-25 — End: 2023-04-25
  Administered 2023-04-25: 9 mL

## 2023-04-25 NOTE — Progress Notes (Signed)
Pt is a 57 yr old female with hx of fibromyalgia- since 1985, myofascial pain and chronic low back pain who's being followed by Dr Dallas Schimke, ortho for low back- also has B/L knee DJD- BMI 39.89- down to 39.60 currently; also OSA- uses [facility] - is compliant.    Here for f/u of FMS/myofascial pain and trigger point injections.    Feels like things pretty similar since last saw her.   Gabapentin not too sedating- makes her sleep more deeply than Trazodone-  600 mg QHS.    Not taking Trazodone- not real help with sleep.   PT showed her to do myofascial release- didn't get Theracane- was hard to afford- using tennis ball- Using tennis balls now and then- a few times per week- hurts while doing it, but relaxes/feels better after done using it.   Does have [employer].   Tried Lidocaine ointment- is also helpful-  Has difficulty with lotions, and topicals- neurodivergent issues with topicals.   Has B/L knee pain- thinks the pain in medial- sounds like tender point-   Plan: 1. The muscle Burnetta Sabin is at [employer] or [employer]-  or Theracane  at [employer].  You tube has videos on how to use theracane.  So wants to target the primary trigger points which are in neck and shoulder areas, so tennis ball doesn't work as well. TO hold pressure 2-4 minutes not massage with hook or theracane.    2. Patient here for trigger point injections for  Consent done and on chart.  Cleaned areas with alcohol and injected using a 27 gauge 1.5 inch needle  Injected 3 cc- none wasted Using 1% Lidocaine with no EPI  Upper traps B/L  Levators Posterior scalenes Middle scalenes B/L  Splenius Capitus Pectoralis Major B/L  Rhomboids B/L  Infraspinatus Teres Major/minor Thoracic paraspinals Lumbar paraspinals Other injections- B/L first dorsal interossei   Patient's level of pain prior was 5/10 Current level of pain after injections is- ~ 4/10 with improved ROM of neck and shoulders  There was no bleeding or  complications.  Patient was advised to drink a lot of water on day after injections to flush system Will have increased soreness for 12-48 hours after injections.  Can use Lidocaine patches the day AFTER injections Can use theracane on day of injections in places didn't inject Can use cooling  heating pad 4-6 hours AFTER injections  3. Went over FMS and Trigger point/Myofascial pain.   4. Con't Gabapentin 600 mg nightly.   5. Provigil from PCP- con't for fatigue treatment.   6. F/U in 2 months- and q2 months.   7. Exercise ball- try to start with low amount of time and build time up slowly.    I spent a total of  36  minutes on total care today- >50% coordination of care- due to 10 minutes on injections and rest discussing FMS and Myofascial pain and how to treat as detailed above.

## 2023-04-25 NOTE — Addendum Note (Signed)
Addended by: Janean Sark on: 04/25/2023 02:56 PM   Modules accepted: Orders

## 2023-04-25 NOTE — Patient Instructions (Signed)
Plan: 1. The muscle Burnetta Sabin is at [employer] or [employer]-  or Theracane  at [employer].  You tube has videos on how to use theracane.  So wants to target the primary trigger points which are in neck and shoulder areas, so tennis ball doesn't work as well. TO hold pressure 2-4 minutes not massage with hook or theracane.    2. Patient here for trigger point injections for  Consent done and on chart.  Cleaned areas with alcohol and injected using a 27 gauge 1.5 inch needle  Injected 3 cc- none wasted Using 1% Lidocaine with no EPI  Upper traps B/L  Levators Posterior scalenes Middle scalenes B/L  Splenius Capitus Pectoralis Major B/L  Rhomboids B/L  Infraspinatus Teres Major/minor Thoracic paraspinals Lumbar paraspinals Other injections- B/L first dorsal interossei   Patient's level of pain prior was 5/10 Current level of pain after injections is- ~ 4/10 with improved ROM of neck and shoulders  There was no bleeding or complications.  Patient was advised to drink a lot of water on day after injections to flush system Will have increased soreness for 12-48 hours after injections.  Can use Lidocaine patches the day AFTER injections Can use theracane on day of injections in places didn't inject Can use cooling  heating pad 4-6 hours AFTER injections  3. Went over FMS and Trigger point/Myofascial pain.   4. Con't Gabapentin 600 mg nightly.   5. Provigil from PCP- con't for fatigue treatment.   6. F/U in 2 months- and q2 months.

## 2023-04-29 ENCOUNTER — Other Ambulatory Visit (INDEPENDENT_AMBULATORY_CARE_PROVIDER_SITE_OTHER): Payer: Medicare Other

## 2023-04-29 ENCOUNTER — Encounter: Payer: Self-pay | Admitting: Endocrinology

## 2023-04-29 DIAGNOSIS — Z794 Long term (current) use of insulin: Secondary | ICD-10-CM

## 2023-04-29 DIAGNOSIS — E1165 Type 2 diabetes mellitus with hyperglycemia: Secondary | ICD-10-CM | POA: Diagnosis not present

## 2023-04-30 LAB — LIPID PANEL
Cholesterol: 144 mg/dL (ref 0–200)
HDL: 37.5 mg/dL — ABNORMAL LOW (ref >39.00)
NonHDL: 106.42
Total CHOL/HDL Ratio: 4
Triglycerides: 299 mg/dL — ABNORMAL HIGH (ref 0.0–149.0)
VLDL: 59.8 mg/dL — ABNORMAL HIGH (ref 0.0–40.0)

## 2023-04-30 LAB — BASIC METABOLIC PANEL
BUN: 11 mg/dL (ref 6–23)
CO2: 26 mEq/L (ref 19–32)
Calcium: 9.5 mg/dL (ref 8.4–10.5)
Chloride: 103 mEq/L (ref 96–112)
Creatinine, Ser: 1.05 mg/dL (ref 0.40–1.20)
GFR: 58.88 mL/min — ABNORMAL LOW (ref >60.00)
Glucose, Bld: 168 mg/dL — ABNORMAL HIGH (ref 70–99)
Potassium: 4.1 mEq/L (ref 3.5–5.1)
Sodium: 141 mEq/L (ref 135–145)

## 2023-04-30 LAB — LDL CHOLESTEROL, DIRECT: Direct LDL: 66 mg/dL

## 2023-04-30 LAB — MICROALBUMIN / CREATININE URINE RATIO
Creatinine,U: 51.3 mg/dL
Microalb Creat Ratio: 1.4 mg/g (ref 0.0–30.0)
Microalb, Ur: <0.7 mg/dL (ref 0.0–1.9)

## 2023-04-30 LAB — HEMOGLOBIN A1C: Hgb A1c MFr Bld: 7.2 % — ABNORMAL HIGH (ref 4.6–6.5)

## 2023-05-01 ENCOUNTER — Ambulatory Visit: Payer: Medicare Other | Admitting: Pulmonary Disease

## 2023-05-02 ENCOUNTER — Other Ambulatory Visit: Payer: Medicare Other

## 2023-05-05 ENCOUNTER — Ambulatory Visit (HOSPITAL_BASED_OUTPATIENT_CLINIC_OR_DEPARTMENT_OTHER): Payer: Medicare Other | Admitting: Pulmonary Disease

## 2023-05-06 ENCOUNTER — Encounter: Payer: Self-pay | Admitting: Physical Medicine and Rehabilitation

## 2023-05-06 ENCOUNTER — Encounter: Payer: Self-pay | Admitting: Internal Medicine

## 2023-05-06 ENCOUNTER — Encounter: Payer: Self-pay | Admitting: Endocrinology

## 2023-05-06 ENCOUNTER — Ambulatory Visit (HOSPITAL_COMMUNITY)
Admission: RE | Admit: 2023-05-06 | Discharge: 2023-05-06 | Disposition: A | Discharge status: Home / Self Care | Payer: Medicare Other | Source: Ambulatory Visit | Attending: Internal Medicine | Admitting: Internal Medicine

## 2023-05-06 ENCOUNTER — Telehealth (INDEPENDENT_AMBULATORY_CARE_PROVIDER_SITE_OTHER): Payer: Medicare Other | Admitting: Endocrinology

## 2023-05-06 DIAGNOSIS — Z794 Long term (current) use of insulin: Secondary | ICD-10-CM

## 2023-05-06 DIAGNOSIS — R5382 Chronic fatigue, unspecified: Secondary | ICD-10-CM | POA: Diagnosis present

## 2023-05-06 DIAGNOSIS — M7989 Other specified soft tissue disorders: Secondary | ICD-10-CM | POA: Insufficient documentation

## 2023-05-06 DIAGNOSIS — R0609 Other forms of dyspnea: Secondary | ICD-10-CM | POA: Diagnosis not present

## 2023-05-06 DIAGNOSIS — E1165 Type 2 diabetes mellitus with hyperglycemia: Secondary | ICD-10-CM | POA: Diagnosis not present

## 2023-05-06 DIAGNOSIS — R002 Palpitations: Secondary | ICD-10-CM | POA: Diagnosis present

## 2023-05-06 DIAGNOSIS — I10 Essential (primary) hypertension: Secondary | ICD-10-CM | POA: Insufficient documentation

## 2023-05-06 DIAGNOSIS — E7800 Pure hypercholesterolemia, unspecified: Secondary | ICD-10-CM

## 2023-05-06 LAB — ECHOCARDIOGRAM COMPLETE
AR max vel: 2.01 cm2
AV Area VTI: 2.04 cm2
AV Area mean vel: 2.22 cm2
AV Mean grad: 3.2 mmHg
AV Peak grad: 6.8 mmHg
Ao pk vel: 1.3 m/s
Area-P 1/2: 3.72 cm2
S' Lateral: 2.5 cm

## 2023-05-06 MED ORDER — PERFLUTREN LIPID MICROSPHERE
1.0000 mL | INTRAVENOUS | Status: AC | PRN
  Administered 2023-05-06: 3 mL via INTRAVENOUS

## 2023-05-06 MED ORDER — TRESIBA FLEXTOUCH 200 UNIT/ML ~~LOC~~ SOPN
170.0000 [IU] | PEN_INJECTOR | Freq: Every day | SUBCUTANEOUS | 1 refills | Status: DC
Start: 2023-05-06 — End: 2023-07-17

## 2023-05-06 NOTE — Progress Notes (Signed)
Patient ID: Sydney Taylor, female   DOB: 1966-07-06, 57 y.o.   MRN: [id number]           Reason for Appointment: Type II Diabetes follow-up   History of Present Illness   Diagnosis date: 2021  Previous history: She had mild hyperglycemia in 2020 but had significant increase in glucose of 484 in 3/21  Non-insulin hypoglycemic drugs previously used: Jardiance since 9/21, Bydureon, Trulicity, metformin, glipizide Insulin was started in 2022  A1c range in the last few years is: 6.4-12.7  Recent history:     Non-insulin hypoglycemic drugs: Jardiance 25 mg daily, metformin ER 1 g at dinner, Bydureon 2 mg weekly     Insulin regimen: Toujeo 150 units daily          Side effects from medications: Nausea and vomiting with Trulicity  Current self management, blood sugar patterns and problems identified:  A1c is 7.2, was 6.6 in 2/24  Likely why her blood sugars are overall relatively higher than in February even with the same regimen Her weight has stayed about the same Previously appeared to be doing better with Bydureon which she took because of nausea with Trulicity Currently blood sugars are fairly evenly throughout the day and only occasionally will have higher postprandial readings She has been working with her dietitian and she states that generally her diet is fairly good Not doing much exercise because of fatigue and fibromyalgia Overnight blood sugars are fairly consistently high averaging about 160 She has not tried [place] previously and no mealtime insulin  Diet management: Mealtimes are 7AM, 12N, and 6-7PM..  She is vegetarian     Hypoglycemia:  none    Currently using DEXCOM G7  Blood sugars are very stable throughout the day and night with average blood sugar mostly on 160-170 May have only minimal.  Of hyperglycemia sporadically at different times with no consistent pattern Highest blood sugars overall after 9 PM No hypoglycemia As above no significant rise in  blood sugars after meals on an average but occasionally may go up to about 250    CGM use % of time   2-week average/GV 154, GMI 7  Time in range 85     % was 74  % Time Above 180 14  % Time above 250   % Time Below 70 0     Dietician visit: Most recent: 11/23  Weight control:  Wt Readings from Last 3 Encounters:  04/25/23 276 lb (125.2 kg)  03/05/23 277 lb (125.6 kg)  02/04/23 275 lb 1.3 oz (124.8 kg)            Diabetes labs:  Lab Results  Component Value Date   HGBA1C 7.2 (H) 04/29/2023   HGBA1C 6.6 (H) 01/30/2023   HGBA1C 7.1 (A) 08/20/2022   Lab Results  Component Value Date   MICROALBUR <0.7 04/29/2023   LDLCALC 77 01/30/2023   CREATININE 1.05 04/29/2023    No results found for: "FRUCTOSAMINE"   Allergies as of 05/06/2023       Reactions   Cephalexin Itching, Swelling, Other (See Comments)        Medication List        Accurate as of May 06, 2023  8:32 AM. If you have any questions, ask your nurse or doctor.          STOP taking these medications    sucralfate 1 GM/10ML suspension Commonly known as: Carafate Stopped by: Reather Littler, MD  TAKE these medications    Accu-Chek Guide test strip Generic drug: glucose blood 1 each by Other route in the morning, at noon, and at bedtime. Use as instructed   ALPRAZolam 0.5 MG tablet Commonly known as: XANAX Take 0.25 mg by mouth daily as needed for anxiety.   B-12 Methylcobalamin 1000 MCG Tbdp Take 1,000 mcg by mouth daily.   blood glucose meter kit and supplies Kit Inject 1 each into the skin daily. Check blood glucose once a day. Diagnosis code: E 11.9.   calcium carbonate 500 MG chewable tablet Commonly known as: TUMS - dosed in mg elemental calcium Chew 1,000 mg by mouth daily as needed for indigestion or heartburn.   celecoxib 100 MG capsule Commonly known as: CeleBREX Take 1 capsule (100 mg total) by mouth daily as needed.   chlorthalidone 25 MG tablet Commonly known as:  HYGROTON Take 0.5 tablets (12.5 mg total) by mouth daily.   Dexcom G6 Sensor Misc by Does not apply route.   Dexcom G6 Transmitter Misc by Does not apply route.   dicyclomine 10 MG capsule Commonly known as: BENTYL Take 1 capsule (10 mg total) by mouth 4 (four) times daily. For abdominal cramping and loose stool What changed: when to take this   DULoxetine 60 MG capsule Commonly known as: CYMBALTA Take 60 mg by mouth every morning.   Exenatide ER 2 MG Pen INJECT 2 MG SUBCUTANEOUSLY ONCE A WEEK   gabapentin 300 MG capsule Commonly known as: Neurontin Take 1 capsule (300 mg total) by mouth at bedtime. X 1 week then can increase to 600 mg QHS if possible- for FMS   Jardiance 25 MG Tabs tablet Generic drug: empagliflozin TAKE 1 TABLET BY MOUTH ONCE DAILY BEFORE BREAKFAST   L-Methylfolate 15 MG Tabs Take 15 mg by mouth daily at 6 (six) AM.   lidocaine 5 % ointment Commonly known as: XYLOCAINE Apply 1 Application topically 4 (four) times daily as needed.   losartan 25 MG tablet Commonly known as: COZAAR Take 1 tablet (25 mg total) by mouth daily.   metFORMIN 500 MG 24 hr tablet Commonly known as: GLUCOPHAGE-XR Take 2 tablets (1,000 mg total) by mouth daily with breakfast. What changed: when to take this   metoprolol tartrate 25 MG tablet Commonly known as: LOPRESSOR Take 1 tablet (25 mg total) by mouth 2 (two) times daily.   modafinil 100 MG tablet Commonly known as: PROVIGIL Take 100 mg by mouth every morning.   Multivitamin Women 50+ Tabs Take 1 tablet by mouth daily.   N-Acetyl Cysteine 600 MG Caps Generic drug: Acetylcysteine Take 1 capsule by mouth daily.   omega-3 acid ethyl esters 1 g capsule Commonly known as: LOVAZA Take 2 capsules (2 g total) by mouth 2 (two) times daily.   pantoprazole 40 MG tablet Commonly known as: PROTONIX Take 1 tablet (40 mg total) by mouth 2 (two) times daily.   Pen Needles 32G X 4 MM Misc Check BS 2 x a day   ReliOn  Pen Needles 31G X 6 MM Misc Generic drug: Insulin Pen Needle USE 1 PEN NEEDLE ONCE A DAY.   potassium chloride SA 20 MEQ tablet Commonly known as: KLOR-CON M Take 2 tablets (40 mEq total) by mouth 2 (two) times daily.   rosuvastatin 10 MG tablet Commonly known as: CRESTOR Take 1 tablet (10 mg total) by mouth daily.   Toujeo Max SoloStar 300 UNIT/ML Solostar Pen Generic drug: insulin glargine (2 Unit Dial) Inject 170 Units  into the skin every morning. And pen needles 1/day What changed: how much to take        Allergies:  Allergies  Allergen Reactions   Cephalexin Itching, Swelling and Other (See Comments)    Past Medical History:  Diagnosis Date   Anemia    Anxiety    Arrhythmia    Bipolar depression (HCC) 12/28/2019   Chronic fatigue    Chronic headaches    Depression    Diabetes mellitus without complication (HCC)    Fibromyalgia    GERD (gastroesophageal reflux disease)    High cholesterol    HLD (hyperlipidemia)    Hypertension    Insect bite    Migraine    Prediabetes    Sleep apnea    Urinary incontinence    Vasculitis (HCC)     Past Surgical History:  Procedure Laterality Date   BIOPSY  09/04/2022   Procedure: BIOPSY;  Surgeon: Corbin Ade, MD;  [facility]: [facility];  Service: Endoscopy;;   CHOLECYSTECTOMY N/A 03/15/2022   Procedure: LAPAROSCOPIC CHOLECYSTECTOMY WITH INTRAOPERATIVE CHOLANGIOGRAM;  Surgeon: Lucretia Roers, MD;  Location: [facility];  Service: General;  Laterality: N/A;   CHOLECYSTECTOMY  02/03/2022   COLONOSCOPY  2017   per patient normal and needs next one in 2027 (Dr. Allena Katz)   COLONOSCOPY WITH PROPOFOL N/A 09/04/2022   Procedure: COLONOSCOPY WITH PROPOFOL;  Surgeon: Corbin Ade, MD;  [facility]: [facility];  Service: Endoscopy;  Laterality: N/A;   ESOPHAGOGASTRODUODENOSCOPY N/A 05/12/2020   Procedure: ESOPHAGOGASTRODUODENOSCOPY (EGD);  Surgeon: Corbin Ade, MD;  [facility]: [facility];  Service: Endoscopy;   Laterality: N/A;  11:15am   Joint fusion on foot Right    LIVER BIOPSY N/A 03/15/2022   Procedure: LIVER BIOPSY;  Surgeon: Lucretia Roers, MD;  Location: [facility];  Service: General;  Laterality: N/A;   MALONEY DILATION N/A 05/12/2020   Procedure: Alvy Beal;  Surgeon: Corbin Ade, MD;  [facility]: [facility];  Service: Endoscopy;  Laterality: N/A;   NASAL SEPTUM SURGERY     POLYPECTOMY  09/04/2022   Procedure: POLYPECTOMY;  Surgeon: Corbin Ade, MD;  [facility]: [facility];  Service: Endoscopy;;   SINOSCOPY      Family History  Problem Relation Age of Onset   Hypertension Father    Hyperlipidemia Father    Diabetes Father    Stroke Father    Depression Father    Anxiety disorder Father    Hypertension Mother    Hyperlipidemia Mother    Diabetes Mellitus II Mother    Congestive Heart Failure Mother    Heart attack Mother    Hypertension Sister    Diabetes Sister    Hypertension Brother    Autoimmune disease Brother    Diabetes Maternal Grandmother    Lupus Paternal Grandmother    Depression Paternal Grandmother    Anxiety disorder Paternal Grandmother    Depression Maternal Aunt    Anxiety disorder Maternal Aunt    Colon cancer Neg Hx    Esophageal cancer Neg Hx    Gastric cancer Neg Hx     Social History:  reports that she quit smoking about 40 years ago. Her smoking use included cigarettes. She has never used smokeless tobacco. She reports that she does not currently use alcohol. She reports that she does not use drugs.  Review of Systems:  Last diabetic eye exam date 7/23  Last urine microalbumin date: 4/23  Last foot exam date: 3/24  Symptoms of neuropathy: None  Hypertension:   ACE/ARB medication: Losartan 25 mg daily, followed by PCP  BP Readings from Last 3 Encounters:  04/25/23 132/85  03/05/23 129/84  02/04/23 130/72   Also on Jardiance 25 mg, renal function stable:  Lab Results  Component Value Date   CREATININE 1.05  04/29/2023   CREATININE 1.10 (H) 04/01/2023   CREATININE 0.98 01/30/2023     Lipid management: Taking rosuvastatin 10 mg: Currently not on colestipol    Lab Results  Component Value Date   CHOL 144 04/29/2023   CHOL 160 01/30/2023   CHOL 136 12/14/2021   Lab Results  Component Value Date   HDL 37.50 (L) 04/29/2023   HDL 37 (L) 01/30/2023   HDL 36 (L) 12/14/2021   Lab Results  Component Value Date   LDLCALC 77 01/30/2023   LDLCALC 68 12/14/2021   LDLCALC 133 (H) 01/21/2020   Lab Results  Component Value Date   TRIG 299.0 (H) 04/29/2023   TRIG 281 (H) 01/30/2023   TRIG 189 (H) 12/14/2021   Lab Results  Component Value Date   CHOLHDL 4 04/29/2023   CHOLHDL 4.3 01/30/2023   CHOLHDL 3.8 12/14/2021   Lab Results  Component Value Date   LDLDIRECT 66.0 04/29/2023     Fibrosis 4 Score = .57 (Low risk)        Interpretation for patients with NAFLD          <1.30       -  F0-F1 (Low risk)          1.30-2.67 -  Indeterminate           >2.67      -  F3-F4 (High risk)     Validated for ages 70-65      Examination:   LMP  (LMP Unknown)   There is no height or weight on file to calculate BMI.    ASSESSMENT/ PLAN:    Diabetes type 2 insulin requiring:   Current regimen: 150 units Toujeo, Jardiance, Bydureon and metformin  See history of present illness for detailed discussion of current diabetes management, blood sugar patterns from her Dexcom download and problems identified  A1c higher at 7.2, GMI same  Her blood sugars averaging about 160 throughout the day with some occasional hyperglycemia at times but no consistent postprandial spikes Her weight has stayed the same She is currently not exercising much Even with working with the dietitian and doing Bydureon has not been able to lose weight  Recommendations:   She will be given a trial of Tresiba instead of Toujeo to see if she can reduce her insulin requirement In the meantime start increasing Toujeo by  10 units to get her fasting readings mostly been 120-140, may need up to 170 units once a day Also if available we can give her a sample of Mounjaro 2.5 to try instead of Bydureon for better efficacy with blood sugar control and weight loss. Encouraged her to start walking or other exercise program even if she can do small sessions twice a day Discussed need for weight loss Stay on Jardiance 25 mg daily Follow-up in 3 months  Dyslipidemia: Lipids with LDL at target below 70  She is up-to-date with her preventive care and notes updated as above Microalbumin normal  There are no Patient Instructions on file for this visit.  Total visit time including counseling = 30 minutes  Reather Littler 05/06/2023, 8:32 AM

## 2023-05-06 NOTE — Progress Notes (Signed)
*  PRELIMINARY RESULTS* Echocardiogram 2D Echocardiogram has been performed with Definity.  Stacey Drain 05/06/2023, 3:21 PM

## 2023-05-08 ENCOUNTER — Ambulatory Visit: Payer: Medicare Other | Admitting: Dietician

## 2023-05-12 NOTE — Telephone Encounter (Signed)
Could set up for a calcium score CT to see if has signficant plaquing of coronary arteries   Insurance would not cover

## 2023-05-13 ENCOUNTER — Ambulatory Visit: Payer: Medicare Other | Admitting: Internal Medicine

## 2023-05-13 ENCOUNTER — Other Ambulatory Visit: Payer: Self-pay

## 2023-05-13 ENCOUNTER — Encounter: Payer: Self-pay | Admitting: Internal Medicine

## 2023-05-13 VITALS — BP 117/81 | HR 93 | Temp 98.0°F | Ht 70.0 in | Wt 276.0 lb

## 2023-05-13 DIAGNOSIS — K760 Fatty (change of) liver, not elsewhere classified: Secondary | ICD-10-CM

## 2023-05-13 DIAGNOSIS — E782 Mixed hyperlipidemia: Secondary | ICD-10-CM

## 2023-05-13 NOTE — Progress Notes (Signed)
Primary Care Physician:  Anabel Halon, MD Primary Gastroenterologist:  Dr. Jena Gauss  Pre-Procedure History & Physical: HPI:  Sydney Taylor is a 57 y.o. female here for for follow-up of hepatomegaly and hypodense splenic lesion.  Recent CT confirmed low-density lesion chronic finding consistent with benign disease no further evaluation recommended by the radiologist.  Enlarged hypodense liver consistent with fatty infiltration.  Liver biopsy revealed benign steatosis.  No fibrosis.  LFTs have been normal.  GERD symptoms been well-controlled with Protonix 40 mg once daily.  No dysphagia.  She has done well since getting her gallbladder out.  History of colonic adenomas removed previously; due for surveillance examination 2030.  Past Medical History:  Diagnosis Date   Anemia    Anxiety    Arrhythmia    Bipolar depression (HCC) 12/28/2019   Chronic fatigue    Chronic headaches    Depression    Diabetes mellitus without complication (HCC)    Fibromyalgia    GERD (gastroesophageal reflux disease)    High cholesterol    HLD (hyperlipidemia)    Hypertension    Insect bite    Migraine    Prediabetes    Sleep apnea    Urinary incontinence    Vasculitis (HCC)     Past Surgical History:  Procedure Laterality Date   BIOPSY  09/04/2022   Procedure: BIOPSY;  Surgeon: Corbin Ade, MD;  [facility];  Service: Endoscopy;;   CHOLECYSTECTOMY N/A 03/15/2022   Procedure: LAPAROSCOPIC CHOLECYSTECTOMY WITH INTRAOPERATIVE CHOLANGIOGRAM;  Surgeon: Lucretia Roers, MD;  Location: [facility];  Service: General;  Laterality: N/A;   CHOLECYSTECTOMY  02/03/2022   COLONOSCOPY  2017   per patient normal and needs next one in 2027 (Dr. Allena Katz)   COLONOSCOPY WITH PROPOFOL N/A 09/04/2022   Procedure: COLONOSCOPY WITH PROPOFOL;  Surgeon: Corbin Ade, MD;  [facility];  Service: Endoscopy;  Laterality: N/A;   ESOPHAGOGASTRODUODENOSCOPY N/A 05/12/2020   Procedure:  ESOPHAGOGASTRODUODENOSCOPY (EGD);  Surgeon: Corbin Ade, MD;  [facility];  Service: Endoscopy;  Laterality: N/A;  11:15am   Joint fusion on foot Right    LIVER BIOPSY N/A 03/15/2022   Procedure: LIVER BIOPSY;  Surgeon: Lucretia Roers, MD;  Location: [facility];  Service: General;  Laterality: N/A;   MALONEY DILATION N/A 05/12/2020   Procedure: Alvy Beal;  Surgeon: Corbin Ade, MD;  [facility];  Service: Endoscopy;  Laterality: N/A;   NASAL SEPTUM SURGERY     POLYPECTOMY  09/04/2022   Procedure: POLYPECTOMY;  Surgeon: Corbin Ade, MD;  [facility];  Service: Endoscopy;;   SINOSCOPY      Prior to Admission medications   Medication Sig Start Date End Date Taking? Authorizing Provider  Acetylcysteine (N-ACETYL CYSTEINE) 600 MG CAPS Take 1 capsule by mouth daily.   Yes [provider]  ALPRAZolam Prudy Feeler) 0.5 MG tablet Take 0.25 mg by mouth daily as needed for anxiety. 07/05/20  Yes [provider]  B-12 Methylcobalamin 1000 MCG TBDP Take 1,000 mcg by mouth daily.   Yes [provider]  blood glucose meter kit and supplies KIT Inject 1 each into the skin daily. Check blood glucose once a day. Diagnosis code: E 11.9. 02/21/21  Yes Gosrani, Nimish C, MD  calcium carbonate (TUMS - DOSED IN MG ELEMENTAL CALCIUM) 500 MG chewable tablet Chew 1,000 mg by mouth daily as needed for indigestion or heartburn.   Yes [provider]  celecoxib (CELEBREX) 100 MG capsule Take 1 capsule (100 mg total) by mouth daily as needed. 01/30/23  Yes Anabel Halon, MD  chlorthalidone (HYGROTON) 25 MG tablet Take 0.5 tablets (12.5 mg total) by mouth daily. 03/05/23  Yes Anabel Halon, MD  Continuous Blood Gluc Sensor (DEXCOM G6 SENSOR) MISC by Does not apply route.   Yes [provider]  Continuous Blood Gluc Transmit (DEXCOM G6 TRANSMITTER) MISC by Does not apply route.   Yes [provider]  dicyclomine (BENTYL) 10 MG  capsule Take 1 capsule (10 mg total) by mouth 4 (four) times daily. For abdominal cramping and loose stool Patient taking differently: Take 10 mg by mouth at bedtime. For abdominal cramping and loose stool 04/09/23  Yes Makella Buckingham, Gerrit Friends, MD  DULoxetine (CYMBALTA) 60 MG capsule Take 60 mg by mouth every morning. Two capsules (120mg ) daily 05/14/21  Yes [provider]  empagliflozin (JARDIANCE) 25 MG TABS tablet TAKE 1 TABLET BY MOUTH ONCE DAILY BEFORE BREAKFAST 01/28/23  Yes Reather Littler, MD  Exenatide ER 2 MG PEN INJECT 2 MG SUBCUTANEOUSLY ONCE A WEEK 03/18/23  Yes Reather Littler, MD  gabapentin (NEURONTIN) 300 MG capsule Take 1 capsule (300 mg total) by mouth at bedtime. X 1 week then can increase to 600 mg QHS if possible- for FMS 01/27/23  Yes Lovorn, Aundra Millet, MD  glucose blood (ACCU-CHEK GUIDE) test strip 1 each by Other route in the morning, at noon, and at bedtime. Use as instructed 08/07/21  Yes [name], NP  Insulin Pen Needle (PEN NEEDLES) 32G X 4 MM MISC Check BS 2 x a day 10/22/21  Yes Romero Belling, MD  Insulin Pen Needle (RELION PEN NEEDLES) 31G X 6 MM MISC USE 1 PEN NEEDLE ONCE A DAY. 02/17/23  Yes Reather Littler, MD  L-Methylfolate 15 MG TABS Take 15 mg by mouth daily at 6 (six) AM.   Yes [provider]  losartan (COZAAR) 25 MG tablet Take 1 tablet (25 mg total) by mouth daily. 03/05/23  Yes Anabel Halon, MD  metFORMIN (GLUCOPHAGE-XR) 500 MG 24 hr tablet Take 2 tablets (1,000 mg total) by mouth daily with breakfast. Patient taking differently: Take 1,000 mg by mouth daily with supper. 08/02/22  Yes Reather Littler, MD  metoprolol tartrate (LOPRESSOR) 25 MG tablet Take 1 tablet (25 mg total) by mouth 2 (two) times daily. 02/28/22 05/13/23 Yes Pricilla Riffle, MD  modafinil (PROVIGIL) 100 MG tablet Take 100 mg by mouth every morning.   Yes [provider]  Multiple Vitamins-Minerals (MULTIVITAMIN WOMEN 50+) TABS Take 1 tablet by mouth daily.   Yes [provider]  omega-3  acid ethyl esters (LOVAZA) 1 g capsule Take 2 capsules (2 g total) by mouth 2 (two) times daily. 03/05/23  Yes Anabel Halon, MD  pantoprazole (PROTONIX) 40 MG tablet Take 1 tablet (40 mg total) by mouth 2 (two) times daily. Patient taking differently: Take 40 mg by mouth daily. 11/01/22  Yes Jovana Rembold, Gerrit Friends, MD  potassium chloride SA (KLOR-CON M) 20 MEQ tablet Take 2 tablets (40 mEq total) by mouth 2 (two) times daily. Patient taking differently: Take 40 mEq by mouth daily. 11/18/22  Yes Anabel Halon, MD  rosuvastatin (CRESTOR) 10 MG tablet Take 1 tablet (10 mg total) by mouth daily. Patient taking differently: Take 10 mg by mouth every other day. 04/07/23  Yes Patel, Earlie Lou, MD  insulin degludec (TRESIBA FLEXTOUCH) 200 UNIT/ML FlexTouch Pen Inject 170  Units into the skin daily. Patient not taking: Reported on 05/13/2023 05/06/23   Reather Littler, MD    Allergies as of 05/13/2023 - Review Complete 05/13/2023  Allergen Reaction Noted   Cephalexin Itching, Swelling, and Other (See Comments) 05/02/2020    Family History  Problem Relation Age of Onset   Hypertension Father    Hyperlipidemia Father    Diabetes Father    Stroke Father    Depression Father    Anxiety disorder Father    Hypertension Mother    Hyperlipidemia Mother    Diabetes Mellitus II Mother    Congestive Heart Failure Mother    Heart attack Mother    Hypertension Sister    Diabetes Sister    Hypertension Brother    Autoimmune disease Brother    Diabetes Maternal Grandmother    Lupus Paternal Grandmother    Depression Paternal Grandmother    Anxiety disorder Paternal Grandmother    Depression Maternal Aunt    Anxiety disorder Maternal Aunt    Colon cancer Neg Hx    Esophageal cancer Neg Hx    Gastric cancer Neg Hx     Social History   Socioeconomic History   Marital status: Married    Spouse name: Not on file   Number of children: 0   Years of education: Not on file   Highest education level: Bachelor's  degree (e.g., BA, AB, BS)  Occupational History   Occupation: research associate/drug development  Tobacco Use   Smoking status: Former    Types: Cigarettes    Quit date: 12/30/1982    Years since quitting: 40.3   Smokeless tobacco: Never  Vaping Use   Vaping Use: Never used  Substance and Sexual Activity   Alcohol use: Not Currently    Alcohol/week: 0.0 standard drinks of alcohol   Drug use: Never   Sexual activity: Yes    Birth control/protection: Post-menopausal  Other Topics Concern   Not on file  Social History Narrative   Married for 3 years.Lives with husband.Bachelors in Building surveyor.   Right handed   Caffeine: 1-2 per day    Social Determinants of Health   Financial Resource Strain: Low Risk  (06/04/2022)   Overall Financial Resource Strain (CARDIA)    Difficulty of Paying Living Expenses: Not hard at all  Food Insecurity: No Food Insecurity (06/04/2022)   Hunger Vital Sign    Worried About Running Out of Food in the Last Year: Never true    Ran Out of Food in the Last Year: Never true  Transportation Needs: No Transportation Needs (06/04/2022)   PRAPARE - Administrator, Civil Service (Medical): No    Lack of Transportation (Non-Medical): No  Physical Activity: Inactive (06/04/2022)   Exercise Vital Sign    Days of Exercise per Week: 0 days    Minutes of Exercise per Session: 0 min  Stress: No Stress Concern Present (06/04/2022)   [employer] of Occupational Health - Occupational Stress Questionnaire    Feeling of Stress : Not at all  Social Connections: Moderately Integrated (06/04/2022)   Social Connection and Isolation Panel [NHANES]    Frequency of Communication with Friends and Family: More than three times a week    Frequency of Social Gatherings with Friends and Family: More than three times a week    Attends Religious Services: More than 4 times per year    Active Member of [employer] or Organizations: No    Attends Banker Meetings: Never  Marital Status: Married  Catering manager Violence: Not At Risk (06/04/2022)   Humiliation, Afraid, Rape, and Kick questionnaire    Fear of Current or Ex-Partner: No    Emotionally Abused: No    Physically Abused: No    Sexually Abused: No    Review of Systems: See HPI, otherwise negative ROS  Physical Exam: BP 117/81 (BP Location: Left Arm, Patient Position: Sitting, Cuff Size: Large)   Pulse 93   Temp 98 F (36.7 C) (Oral)   Ht 5\' 10"  (1.778 m)   Wt 276 lb (125.2 kg)   LMP  (LMP Unknown)   SpO2 97%   BMI 39.60 kg/m  General:   Alert,  Well-developed, well-nourished, pleasant and cooperative in NAD Lungs:  Clear throughout to auscultation.   No wheezes, crackles, or rhonchi. No acute distress. Heart:  Regular rate and rhythm; no murmurs, clicks, rubs,  or gallops. Abdomen: Obese.  Soft and nontender liver edge indistinct below right costal margin to percussion.   Pulses:  Normal pulses noted. Extremities:  Without clubbing or edema.  Impression/Plan: 57 year old morbidly obese lady with hepatomegaly secondary to nonalcoholic fatty liver disease.  No evidence of steatohepatitis or fibrosis on recent liver biopsy.  Hypodense splenic lesion felt to be benign based on CT criteria.  No further evaluation recommended.  GERD well-controlled on once daily PPI.  History of colonic adenoma; due for surveillance colonoscopy 2030.   Recommendations:  Continue Protonix 40 mg daily for reflux.  Best taken 30 minutes before meal  Colonoscopy 2030 due to a history of colonic polyp  Again, aerobic exercise 30 to 45 minutes 3 times a week.  Steady, slow weight loss recommended.  1-2 cups of coffee daily is good for your liver.  Follow-up with your endocrinologist and primary care regarding assistance with this endeavor.    Notice: This dictation was prepared with Dragon dictation along with smaller phrase technology. Any transcriptional errors that result from this process are  unintentional and may not be corrected upon review.

## 2023-05-13 NOTE — Patient Instructions (Addendum)
It was good to see you again today!  As discussed you have benign fatty liver.  It is good to know your liver enzymes are not elevated.  The abnormal area in your spleen is benign.  No further evaluation needed.  Continue Protonix 40 mg daily for reflux.  Best taken 30 minutes before meal  Colonoscopy 2030 due to a history of colonic polyp  Again, aerobic exercise 30 to 45 minutes 3 times a week.  Steady, slow weight loss recommended.  Follow-up with your endocrinologist and primary care regarding assistance with this endeavor.  Unless something comes up, we will plan to see you back in 1 year at which time we will repeat your liver enzymes.

## 2023-05-15 ENCOUNTER — Encounter: Payer: Self-pay | Admitting: Internal Medicine

## 2023-05-19 ENCOUNTER — Ambulatory Visit: Payer: Medicare Other | Admitting: Internal Medicine

## 2023-05-19 ENCOUNTER — Ambulatory Visit: Payer: Medicare Other | Admitting: Dietician

## 2023-05-22 ENCOUNTER — Encounter: Payer: Medicare Other | Attending: Endocrinology | Admitting: Dietician

## 2023-05-22 ENCOUNTER — Encounter: Payer: Self-pay | Admitting: Dietician

## 2023-05-22 VITALS — Wt 277.0 lb

## 2023-05-22 DIAGNOSIS — E119 Type 2 diabetes mellitus without complications: Secondary | ICD-10-CM | POA: Diagnosis present

## 2023-05-22 NOTE — Progress Notes (Signed)
Diabetes Self-Management Education  Visit Type: Follow-up  Appt. Start Time: 0210 Appt. End Time: 0245  05/22/2023  Ms. Sydney Taylor, identified by name and date of birth, is a 57 y.o. female with a diagnosis of Diabetes:  .   ASSESSMENT Patient is here today alone.  She was last seen by this RD on 02/07/2023. She states that her aunt passed away in 04/10/23 which has made her less interested in caring for herself. Increased stress regarding her stepson. Craving more sweets . Walks around the property. - Decreased motivation - has a can who would enjoy being outside and patient has thought about leash training her Continues therapy  History includes:  Type 2 Diabetes, MTHFR, Depression (sees a psychiatrist, and counselor, and TMS treatment), OSA on c-pap, HLD, HTN, vitamin B-12 deficiency, fibromyalgia Medications:  Jardiance, Exenatide, Toujeo 150 units q am - to change to [place], metformin, rosuvastatin, Methly volate, vitamin B-12, potassium, MVI, Bentyl, CoQ-10, algal oil Labs noted to include A1C 7.2% 04/29/2023 increased from 6.6% 01/30/2023, eGFR 58 04/29/2023 Lipid Panel     Component Value Date/Time   CHOL 144 04/29/2023 1435   CHOL 160 01/30/2023 1421   TRIG 299.0 (H) 04/29/2023 1435   HDL 37.50 (L) 04/29/2023 1435   HDL 37 (L) 01/30/2023 1421   CHOLHDL 4 04/29/2023 1435   VLDL 59.8 (H) 04/29/2023 1435   LDLCALC 77 01/30/2023 1421   LDLCALC 133 (H) 01/21/2020 1009   LDLDIRECT 66.0 04/29/2023 1435   LABVLDL 46 (H) 01/30/2023 1421    CGM:  Dexcom G7 (uses reader)    CGM Results from download:  02/07/2023 05/22/2023  % Time CGM active:   100 %   (Goal >70%)   Average glucose:   151 mg/dL for 14 days [id number]  Glucose management indicator:   6.9 % 7.1  Time in range (70-180 mg/dL):   85 %   (Goal >19%) 77  Time High (181-250 mg/dL):   14 %   (Goal < 14%) 22  Time Very High (>250 mg/dL):    <1 %   (Goal < 5%) <1  Time Low (54-69 mg/dL):   0 %   (Goal <7%) 0  Time Very  Low (<54 mg/dL):   0 %   (Goal <8%) 0    69.5" 277 lbs 05/22/2023 275 lbs 02/04/2023 278 lbs 01/27/2023 271 lbs 11/04/2022 274 lbs 08/20/2022   Patient lives with her husband.  Patient does most of the cooking and they share shopping.  Husband also has diabetes. She is vegetarian and her husband is willing to eat this way most of the time. Grazes She started plant based eating in her 20's due to GI issues. She is on disability- chronic pain and low energy. She was working in [employer], [employer] prior to this. Sleep is fair - wears c-pap and sleeps lightly but feels better with the c-pap Stress -moderate-high (external), lack of control of diabetes, financial, family issues, grief Saw a RD in the past who was not positive about vegetarian eating.  Weight 277 lb (125.6 kg). Body mass index is 39.75 kg/m.   Diabetes Self-Management Education - 05/22/23 1623       Visit Information   Visit Type Follow-up      Psychosocial Assessment   Patient Belief/Attitude about Diabetes Other (comment)   helpless   What is the hardest part about your diabetes right now, causing you the most concern, or is the most worrisome to you about your diabetes?  Making healty food and beverage choices    Self-care barriers Debilitated state due to current medical condition    Self-management support Doctor's office    Other persons present Patient    Patient Concerns Nutrition/Meal planning;Healthy Lifestyle;Glycemic Control;Problem Solving;Weight Control    Special Needs None    Learning Readiness Ready    How often do you need to have someone help you when you read instructions, pamphlets, or other written materials from your doctor or pharmacy? 1 - Never      Pre-Education Assessment   Patient understands the diabetes disease and treatment process. Comprehends key points    Patient understands incorporating nutritional management into lifestyle. Needs Review    Patient undertands incorporating  physical activity into lifestyle. Comprehends key points    Patient understands using medications safely. Comprehends key points    Patient understands monitoring blood glucose, interpreting and using results Comprehends key points    Patient understands prevention, detection, and treatment of acute complications. Comprehends key points    Patient understands prevention, detection, and treatment of chronic complications. Compreheands key points    Patient understands how to develop strategies to address psychosocial issues. Comprehends key points    Patient understands how to develop strategies to promote health/change behavior. Needs Review      Complications   Last HgB A1C per patient/outside source 7.2 %   04/29/2023 increased from 6.6% 01/30/2023   How often do you check your blood sugar? > 4 times/day    Fasting Blood glucose range (mg/dL) 19-147;829-562    Postprandial Blood glucose range (mg/dL) 130-865;784-696;>295    Number of hypoglycemic episodes per month 0    Number of hyperglycemic episodes ( >200mg /dL): Daily    Can you tell when your blood sugar is high? Yes      Dietary Intake   Breakfast bread with cream cheese, cashews (small handful)    Snack (morning) banana    Lunch vege sausage ( 2 links), tator tots    Snack (afternoon) licorice    Dinner tabouli, couscous, green beans, fake chicken nuggets, licorice    Snack (evening) none    Beverage(s) water, coffee with almond coconut creamer pods (unsweetened), iced tea (sugar sub), diet soda, selzer water      Activity / Exercise   Activity / Exercise Type ADL's      Patient Education   Previous Diabetes Education Yes (please comment)   01/2023   Healthy Eating Meal options for control of blood glucose level and chronic complications.    Being Active Role of exercise on diabetes management, blood pressure control and cardiac health.;Helped patient identify appropriate exercises in relation to his/her diabetes, diabetes  complications and other health issue.    Medications Reviewed patients medication for diabetes, action, purpose, timing of dose and side effects.    Monitoring Taught/evaluated CGM (comment)    Diabetes Stress and Support Identified and addressed patients feelings and concerns about diabetes;Worked with patient to identify barriers to care and solutions      Individualized Goals (developed by patient)   Nutrition General guidelines for healthy choices and portions discussed    Physical Activity Exercise 5-7 days per week;15 minutes per day    Medications take my medication as prescribed    Monitoring  Consistenly use CGM    Problem Solving Eating Pattern    Reducing Risk treat hypoglycemia with 15 grams of carbs if blood glucose less than 70mg /dL;do foot checks daily;examine blood glucose patterns    Health Coping  Ask for help with psychological, social, or emotional issues      Patient Self-Evaluation of Goals - Patient rates self as meeting previously set goals (% of time)   Nutrition 50 - 75 % (half of the time)    Physical Activity < 25% (hardly ever/never)    Medications >75% (most of the time)    Monitoring >75% (most of the time)    Problem Solving and behavior change strategies  >75% (most of the time)    Reducing Risk (treating acute and chronic complications) >75% (most of the time)    Health Coping 50 - 75 % (half of the time)      Post-Education Assessment   Patient understands the diabetes disease and treatment process. Demonstrates understanding / competency    Patient understands incorporating nutritional management into lifestyle. Comprehends key points    Patient undertands incorporating physical activity into lifestyle. Comprehends key points    Patient understands using medications safely. Demonstrates understanding / competency    Patient understands monitoring blood glucose, interpreting and using results Demonstrates understanding / competency    Patient understands  prevention, detection, and treatment of acute complications. Demonstrates understanding / competency    Patient understands prevention, detection, and treatment of chronic complications. Demonstrates understanding / competency    Patient understands how to develop strategies to address psychosocial issues. Comprehends key points    Patient understands how to develop strategies to promote health/change behavior. Comprehends key points      Outcomes   Expected Outcomes Demonstrated interest in learning but significant barriers to change    Future DMSE 3-4 months    Program Status Not Completed      Subsequent Visit   Since your last visit have you continued or begun to take your medications as prescribed? Yes    Since your last visit have you experienced any weight changes? Gain    Weight Gain (lbs) 2             Individualized Plan for Diabetes Self-Management Training:   Learning Objective:  Patient will have a greater understanding of diabetes self-management. Patient education plan is to attend individual and/or group sessions per assessed needs and concerns.   Plan:   Patient Instructions  Find ways to be more active  Armchair exercises with your husband  Youtube exercise video  Walking in the morning or the best time related to the weather - take your cat?  Tips for sweet cravings -   Make decisions in the grocery store  Stay hydrated  Think of a choice that is sweet but nutrient dense (energy balls, protein shake, etc)   Continue to be mindful about your protein intake  Consider Orgain plant based protein shake (ready to drink)  Plant You cookbooks on-line [email or web address] and on youtube      Expected Outcomes:  Demonstrated interest in learning but significant barriers to change  Education material provided:   If problems or questions, patient to contact team via:  Phone  Future DSME appointment: 3-4 months

## 2023-05-22 NOTE — Patient Instructions (Addendum)
Find ways to be more active  Armchair exercises with your husband  Youtube exercise video  Walking in the morning or the best time related to the weather - take your cat?  Tips for sweet cravings -   Make decisions in the grocery store  Stay hydrated  Think of a choice that is sweet but nutrient dense (energy balls, protein shake, etc)   Continue to be mindful about your protein intake  Consider Orgain plant based protein shake (ready to drink)  Plant You cookbooks on-line [email or web address] and on youtube

## 2023-05-28 ENCOUNTER — Encounter: Payer: Self-pay | Admitting: Pulmonary Disease

## 2023-05-28 ENCOUNTER — Ambulatory Visit: Payer: Medicare Other | Admitting: Pulmonary Disease

## 2023-05-28 VITALS — BP 128/83 | HR 94 | Ht 70.0 in | Wt 275.0 lb

## 2023-05-28 DIAGNOSIS — G4733 Obstructive sleep apnea (adult) (pediatric): Secondary | ICD-10-CM | POA: Diagnosis not present

## 2023-05-28 DIAGNOSIS — G4710 Hypersomnia, unspecified: Secondary | ICD-10-CM | POA: Diagnosis not present

## 2023-05-28 NOTE — Assessment & Plan Note (Signed)
Persistent in spite of adequate CPAP usage. Modafinil has been prescribed by psychiatrist and she is tolerating well

## 2023-05-28 NOTE — Patient Instructions (Signed)
x prescription for replacement auto CPAP 8 to 13 cm to [facility]

## 2023-05-28 NOTE — Progress Notes (Signed)
   Subjective:    Patient ID: Sydney Taylor, female    DOB: Mar 06, 1966, 57 y.o.   MRN: [id number]  HPI  57 yo woman for follow-up of OSA DME -[facility]  PMH -diabetes type 2, hypertension, fibromyalgia, tachycardia   7-month follow-up visit. She denies any problems with mask or pressure.  She has settled down with nasal pillows.  Reviewed prior sleep study from [facility].  Husband notes occasional leak when pillow slips of her face and wakes her up. She takes modafinil 100 mg in the morning and another 50 mg in the afternoon.  This helps to keep her more alert. Blood pressure is controlled on 2 medications she takes gabapentin at night She denies any problems with pressure. She requests replacement CPAP  Significant tests/ events reviewed   10/2015 NPSG >> mod OSA, AHI 26/h, low spO2 77% 11/2015 CPAP >> 8 cm  Review of Systems neg for any significant sore throat, dysphagia, itching, sneezing, nasal congestion or excess/ purulent secretions, fever, chills, sweats, unintended wt loss, pleuritic or exertional cp, hempoptysis, orthopnea pnd or change in chronic leg swelling. Also denies presyncope, palpitations, heartburn, abdominal pain, nausea, vomiting, diarrhea or change in bowel or urinary habits, dysuria,hematuria, rash, arthralgias, visual complaints, headache, numbness weakness or ataxia.     Objective:   Physical Exam  Gen. Pleasant, obese, in no distress ENT - no lesions, no post nasal drip Neck: No JVD, no thyromegaly,  Lungs: no use of accessory muscles, Cardiovascular: Rhythm regular, no peripheral edema Musculoskeletal: No deformities, no cyanosis or clubbing , no tremors       Assessment & Plan:

## 2023-05-28 NOTE — Assessment & Plan Note (Signed)
CPAP download was reviewed which shows excellent control of events on auto settings 5 to 20 cm with average pressure 11.5 and maximum pressure of 13 cm.  She is very compliant more than 7 hours every night.  CPAP is only helped improve her daytime somnolence and fatigue. She will be eligible for replacement machine and will write a prescription for auto CPAP 8 to 13 cm  Weight loss encouraged, compliance with goal of at least 4-6 hrs every night is the expectation. Advised against medications with sedative side effects Cautioned against driving when sleepy - understanding that sleepiness will vary on a day to day basis

## 2023-05-30 ENCOUNTER — Other Ambulatory Visit: Payer: Self-pay | Admitting: Endocrinology

## 2023-05-30 ENCOUNTER — Ambulatory Visit (HOSPITAL_COMMUNITY): Payer: Medicare Other

## 2023-05-30 DIAGNOSIS — E1165 Type 2 diabetes mellitus with hyperglycemia: Secondary | ICD-10-CM

## 2023-06-09 ENCOUNTER — Encounter: Payer: Self-pay | Admitting: Physical Medicine and Rehabilitation

## 2023-06-09 ENCOUNTER — Encounter: Payer: Self-pay | Admitting: Internal Medicine

## 2023-06-10 ENCOUNTER — Telehealth: Payer: Self-pay | Admitting: Internal Medicine

## 2023-06-10 MED ORDER — METOPROLOL TARTRATE 25 MG PO TABS: 25.0000 mg | ORAL_TABLET | Freq: Two times a day (BID) | ORAL | 1 refills | Status: DC

## 2023-06-10 NOTE — Telephone Encounter (Signed)
Has apt in July,refilled as requested

## 2023-06-10 NOTE — Telephone Encounter (Signed)
*  STAT* If patient is at the pharmacy, call can be transferred to refill team.   1. Which medications need to be refilled? (please list name of each medication and dose if known)   metoprolol tartrate (LOPRESSOR) 25 MG tablet (Expired)    2. Which pharmacy/location (including street and city if local pharmacy) is medication to be sent to?[id number] - [place], [place] - [address]   3. Do they need a 30 day or 90 day supply? 90 day

## 2023-06-11 ENCOUNTER — Ambulatory Visit (INDEPENDENT_AMBULATORY_CARE_PROVIDER_SITE_OTHER): Payer: Medicare Other

## 2023-06-11 VITALS — BP 130/80 | Ht 70.0 in | Wt 275.0 lb

## 2023-06-11 DIAGNOSIS — Z0000 Encounter for general adult medical examination without abnormal findings: Secondary | ICD-10-CM

## 2023-06-11 NOTE — Patient Instructions (Signed)
Ms. Sydney Taylor , Thank you for taking time to come for your Medicare Wellness Visit. I appreciate your ongoing commitment to your health goals. Please review the following plan we discussed and let me know if I can assist you in the future.   These are the goals we discussed:  Goals      Medication Management     Patient Goals/Self-Care Activities Patient will:  Take medications as prescribed Check blood sugar continuously with continuous glucose monitor, document, and provide at future appointments Engage in dietary modifications by less frequent dining out and fewer sweetened foods & beverages         Patient Stated     Would like to not feel like crap all the time and be exhausted all the time   Would like to lose weight atleast 100lbs     Patient Stated     Patients goal is to lose weight and lower her A1C        This is a list of the screening recommended for you and due dates:  Health Maintenance  Topic Date Due   COVID-19 Vaccine (5 - 2023-24 season) 08/30/2022   Eye exam for diabetics  07/11/2023   Flu Shot  07/31/2023   Pap Smear  09/30/2023   Hemoglobin A1C  10/29/2023   Mammogram  01/23/2024   Complete foot exam   03/04/2024   Yearly kidney function blood test for diabetes  04/28/2024   Yearly kidney health urinalysis for diabetes  04/28/2024   Medicare Annual Wellness Visit  06/10/2024   DTaP/Tdap/Td vaccine (2 - Tdap) 12/30/2030   Colon Cancer Screening  09/04/2032   Hepatitis C Screening  Completed   HIV Screening  Completed   Zoster (Shingles) Vaccine  Completed   HPV Vaccine  Aged Out    Advanced directives: Advance directive discussed with you today. Even though you declined this today, please call our office should you change your mind, and we can give you the proper paperwork for you to fill out. Advance care planning is a way to make decisions about medical care that fits your values in case you are ever unable to make these decisions for  yourself.  Information on Advanced Care Planning can be found at [facility] Advance Health Care Directives Advance Health Care Directives ([email or web address]    Conditions/risks identified: Aim for 30 minutes of exercise or brisk walking, 6-8 glasses of water, and 5 servings of fruits and vegetables each day. Myofascial Pain Syndrome and Fibromyalgia Myofascial pain syndrome and fibromyalgia are both pain disorders. You may feel this pain mainly in your muscles. Myofascial pain syndrome: Always has tender points in the muscles that will cause pain when pressed (trigger points). The pain may come and go. Usually affects your neck, upper back, and shoulder areas. The pain often moves into your arms and hands. Fibromyalgia: Has muscle pains and tenderness that come and go. Is often associated with tiredness (fatigue) and sleep problems. Has trigger points. Tends to be long-lasting (chronic), but is not life-threatening. Fibromyalgia and myofascial pain syndrome are not the same. However, they often occur together. If you have both conditions, each can make the other worse. Both are common and can cause enough pain and fatigue to make day-to-day activities difficult. Both can be hard to diagnose because their symptoms are common in many other conditions. What are the causes? The exact causes of these conditions are not known. What increases the risk? You are more likely to  develop either of these conditions if: You have a family history of the condition. You are female. You have certain triggers, such as: Spine disorders. An injury (trauma) or other physical stressors. Being under a lot of stress. Medical conditions such as osteoarthritis, rheumatoid arthritis, or lupus. What are the signs or symptoms? Fibromyalgia The main symptom of fibromyalgia is widespread pain and tenderness in your muscles. Pain is sometimes described as stabbing, shooting, or burning. You may also  have: Tingling or numbness. Sleep problems and fatigue. Problems with attention and concentration (fibro fog). Other symptoms may include: Bowel and bladder problems. Headaches. Vision problems. Sensitivity to odors and noises. Depression or mood changes. Painful menstrual periods (dysmenorrhea). Dry skin or eyes. These symptoms can vary over time. Myofascial pain syndrome Symptoms of myofascial pain syndrome include: Tight, ropy bands of muscle. Uncomfortable sensations in muscle areas. These may include aching, cramping, burning, numbness, tingling, and weakness. Difficulty moving certain parts of the body freely (poor range of motion). How is this diagnosed? This condition may be diagnosed by your symptoms and medical history. You will also have a physical exam. In general: Fibromyalgia is diagnosed if you have pain, fatigue, and other symptoms for more than 3 months, and symptoms cannot be explained by another condition. Myofascial pain syndrome is diagnosed if you have trigger points in your muscles, and those trigger points are tender and cause pain elsewhere in your body (referred pain). How is this treated? Treatment for these conditions depends on the type that you have. For fibromyalgia, a healthy lifestyle is the most important treatment including aerobic and strength exercises. Different types of medicines are used to help treat pain and include: NSAIDs. Medicines for treating depression. Medicines that help control seizures. Medicines that relax the muscles. Treatment for myofascial pain syndrome includes: Pain medicines, such as NSAIDs. Cooling and stretching of muscles. Massage therapy with myofascial release technique. Trigger point injections. Treating these conditions often requires a team of health care providers. These may include: Your primary care provider. A physical therapist. Complementary health care providers, such as massage therapists or  acupuncturists. A psychiatrist for cognitive behavioral therapy. Follow these instructions at home: Medicines Take over-the-counter and prescription medicines only as told by your health care provider. Ask your health care provider if the medicine prescribed to you: Requires you to avoid driving or using machinery. Can cause constipation. You may need to take these actions to prevent or treat constipation: Drink enough fluid to keep your urine pale yellow. Take over-the-counter or prescription medicines. Eat foods that are high in fiber, such as beans, whole grains, and fresh fruits and vegetables. Limit foods that are high in fat and processed sugars, such as fried or sweet foods. Lifestyle  Do exercises as told by your health care provider or physical therapist. Practice relaxation techniques to control your stress. You may want to try: Biofeedback. Visual imagery. Hypnosis. Muscle relaxation. Yoga. Meditation. Maintain a healthy lifestyle. This includes eating a healthy diet and getting enough sleep. Do not use any products that contain nicotine or tobacco. These products include cigarettes, chewing tobacco, and vaping devices, such as e-cigarettes. If you need help quitting, ask your health care provider. General instructions Talk to your health care provider about complementary treatments, such as acupuncture or massage. Do not do activities that stress or strain your muscles. This includes repetitive motions and heavy lifting. Keep all follow-up visits. This is important. Where to find support Consider joining a support group with others who are diagnosed  with this condition. National Fibromyalgia Association: fmaware.org Where to find more information [place]. Pain Foundation: uspainfoundation.org Contact a health care provider if: You have new symptoms. Your symptoms get worse or your pain is severe. You have side effects from your medicines. You have trouble sleeping. Your  condition is causing depression or anxiety. Get help right away if: You have thoughts of hurting yourself or others. Get help right away if you feel like you may hurt yourself or others, or have thoughts about taking your own life. Go to your nearest emergency room or: Call 911. Call the National Suicide Prevention Lifeline at (212)825-3022 or 988. This is open 24 hours a day. Text the Crisis Text Line at 458-024-1261. This information is not intended to replace advice given to you by your health care provider. Make sure you discuss any questions you have with your health care provider. Document Revised: 09/23/2022 Document Reviewed: 11/16/2021 Elsevier Patient Education  2024 [employer].   Next appointment: Follow up in one year for your annual wellness visit.   June 30, 2024 at 2pm TELEPHONE VISIT  Preventive Care 40-64 Years, Female Preventive care refers to lifestyle choices and visits with your health care provider that can promote health and wellness. What does preventive care include? A yearly physical exam. This is also called an annual well check. Dental exams once or twice a year. Routine eye exams. Ask your health care provider how often you should have your eyes checked. Personal lifestyle choices, including: Daily care of your teeth and gums. Regular physical activity. Eating a healthy diet. Avoiding tobacco and drug use. Limiting alcohol use. Practicing safe sex. Taking low-dose aspirin daily starting at age 21. Taking vitamin and mineral supplements as recommended by your health care provider. What happens during an annual well check? The services and screenings done by your health care provider during your annual well check will depend on your age, overall health, lifestyle risk factors, and family history of disease. Counseling  Your health care provider may ask you questions about your: Alcohol use. Tobacco use. Drug use. Emotional well-being. Home and  relationship well-being. Sexual activity. Eating habits. Work and work Astron[employer]. Method of birth control. Menstrual cycle. Pregnancy history. Screening  You may have the following tests or measurements: Height, weight, and BMI. Blood pressure. Lipid and cholesterol levels. These may be checked every 5 years, or more frequently if you are over 60 years old. Skin check. Lung cancer screening. You may have this screening every year starting at age 12 if you have a 30-pack-year history of smoking and currently smoke or have quit within the past 15 years. Fecal occult blood test (FOBT) of the stool. You may have this test every year starting at age 50. Flexible sigmoidoscopy or colonoscopy. You may have a sigmoidoscopy every 5 years or a colonoscopy every 10 years starting at age 19. Hepatitis C blood test. Hepatitis B blood test. Sexually transmitted disease (STD) testing. Diabetes screening. This is done by checking your blood sugar (glucose) after you have not eaten for a while (fasting). You may have this done every 1-3 years. Mammogram. This may be done every 1-2 years. Talk to your health care provider about when you should start having regular mammograms. This may depend on whether you have a family history of breast cancer. BRCA-related cancer screening. This may be done if you have a family history of breast, ovarian, tubal, or peritoneal cancers. Pelvic exam and Pap test. This may be done every 3 years  starting at age 32. Starting at age 81, this may be done every 5 years if you have a Pap test in combination with an HPV test. Bone density scan. This is done to screen for osteoporosis. You may have this scan if you are at high risk for osteoporosis. Discuss your test results, treatment options, and if necessary, the need for more tests with your health care provider. Vaccines  Your health care provider may recommend certain vaccines, such as: Influenza vaccine. This is recommended  every year. Tetanus, diphtheria, and acellular pertussis (Tdap, Td) vaccine. You may need a Td booster every 10 years. Zoster vaccine. You may need this after age 67. Pneumococcal 13-valent conjugate (PCV13) vaccine. You may need this if you have certain conditions and were not previously vaccinated. Pneumococcal polysaccharide (PPSV23) vaccine. You may need one or two doses if you smoke cigarettes or if you have certain conditions. Talk to your health care provider about which screenings and vaccines you need and how often you need them. This information is not intended to replace advice given to you by your health care provider. Make sure you discuss any questions you have with your health care provider. Document Released: 01/12/2016 Document Revised: 09/04/2016 Document Reviewed: 10/17/2015 Elsevier Interactive Patient Education  2017 [employer].    Fall Prevention in the Home Falls can cause injuries. They can happen to people of all ages. There are many things you can do to make your home safe and to help prevent falls. What can I do on the outside of my home? Regularly fix the edges of walkways and driveways and fix any cracks. Remove anything that might make you trip as you walk through a door, such as a raised step or threshold. Trim any bushes or trees on the path to your home. Use bright outdoor lighting. Clear any walking paths of anything that might make someone trip, such as rocks or tools. Regularly check to see if handrails are loose or broken. Make sure that both sides of any steps have handrails. Any raised decks and porches should have guardrails on the edges. Have any leaves, snow, or ice cleared regularly. Use sand or salt on walking paths during winter. Clean up any spills in your garage right away. This includes oil or grease spills. What can I do in the bathroom? Use night lights. Install grab bars by the toilet and in the tub and shower. Do not use towel bars as  grab bars. Use non-skid mats or decals in the tub or shower. If you need to sit down in the shower, use a plastic, non-slip stool. Keep the floor dry. Clean up any water that spills on the floor as soon as it happens. Remove soap buildup in the tub or shower regularly. Attach bath mats securely with double-sided non-slip rug tape. Do not have throw rugs and other things on the floor that can make you trip. What can I do in the bedroom? Use night lights. Make sure that you have a light by your bed that is easy to reach. Do not use any sheets or blankets that are too big for your bed. They should not hang down onto the floor. Have a firm chair that has side arms. You can use this for support while you get dressed. Do not have throw rugs and other things on the floor that can make you trip. What can I do in the kitchen? Clean up any spills right away. Avoid walking on wet floors. Keep  items that you use a lot in easy-to-reach places. If you need to reach something above you, use a strong step stool that has a grab bar. Keep electrical cords out of the way. Do not use floor polish or wax that makes floors slippery. If you must use wax, use non-skid floor wax. Do not have throw rugs and other things on the floor that can make you trip. What can I do with my stairs? Do not leave any items on the stairs. Make sure that there are handrails on both sides of the stairs and use them. Fix handrails that are broken or loose. Make sure that handrails are as long as the stairways. Check any carpeting to make sure that it is firmly attached to the stairs. Fix any carpet that is loose or worn. Avoid having throw rugs at the top or bottom of the stairs. If you do have throw rugs, attach them to the floor with carpet tape. Make sure that you have a light switch at the top of the stairs and the bottom of the stairs. If you do not have them, ask someone to add them for you. What else can I do to help prevent  falls? Wear shoes that: Do not have high heels. Have rubber bottoms. Are comfortable and fit you well. Are closed at the toe. Do not wear sandals. If you use a stepladder: Make sure that it is fully opened. Do not climb a closed stepladder. Make sure that both sides of the stepladder are locked into place. Ask someone to hold it for you, if possible. Clearly mark and make sure that you can see: Any grab bars or handrails. First and last steps. Where the edge of each step is. Use tools that help you move around (mobility aids) if they are needed. These include: Canes. Walkers. Scooters. Crutches. Turn on the lights when you go into a dark area. Replace any light bulbs as soon as they burn out. Set up your furniture so you have a clear path. Avoid moving your furniture around. If any of your floors are uneven, fix them. If there are any pets around you, be aware of where they are. Review your medicines with your doctor. Some medicines can make you feel dizzy. This can increase your chance of falling. Ask your doctor what other things that you can do to help prevent falls. This information is not intended to replace advice given to you by your health care provider. Make sure you discuss any questions you have with your health care provider. Document Released: 10/12/2009 Document Revised: 05/23/2016 Document Reviewed: 01/20/2015 Elsevier Interactive Patient Education  2017 [employer].

## 2023-06-11 NOTE — Progress Notes (Signed)
 I connected with  Sydney Taylor on 06/11/23 by a audio enabled telemedicine application and verified that I am speaking with the correct person using two identifiers.  Patient Location: Home  Provider Location: Home Office  I discussed the limitations of evaluation and management by telemedicine. The patient expressed understanding and agreed to proceed.  Subjective:   Sydney Taylor is a 56 y.o. female who presents for Medicare Annual (Subsequent) preventive examination.  Review of Systems     Cardiac Risk Factors include: diabetes mellitus;dyslipidemia;hypertension;obesity (BMI >30kg/m2);sedentary lifestyle     Objective:    Today's Vitals   06/11/23 1349 06/11/23 1350  BP: 130/80   Weight: 275 lb (124.7 kg)   Height: 5\' 10"  (1.778 m)   PainSc:  6    Body mass index is 39.46 kg/m.     06/11/2023    2:03 PM 04/07/2023   10:43 AM 11/04/2022    3:33 PM 06/04/2022   12:16 PM 03/15/2022    6:26 AM 02/10/2022    7:51 PM 11/08/2021   11:44 AM  Advanced Directives  Does Patient Have a Medical Advance Directive? No Yes Yes Yes No No Yes  Type of Aeronautical engineer of [place];Living will   Healthcare Power of [place];Living will  Does patient want to make changes to medical advance directive?  Yes (MAU/Ambulatory/Procedural Areas - Information given) Yes (MAU/Ambulatory/Procedural Areas - Information given) No - Patient declined   No - Patient declined  Copy of Healthcare Power of Attorney in Chart?    No - copy requested   No - copy requested  Would patient like information on creating a medical advance directive? No - Patient declined    No - Patient declined No - Patient declined     Current Medications (verified) Outpatient Encounter Medications as of 06/11/2023  Medication Sig   Acetylcysteine (N-ACETYL CYSTEINE) 600 MG CAPS Take 1 capsule by mouth daily.   ALPRAZolam (XANAX) 0.5 MG tablet Take 0.25 mg by mouth daily as needed for anxiety.   B-12  Methylcobalamin 1000 MCG TBDP Take 1,000 mcg by mouth daily.   blood glucose meter kit and supplies KIT Inject 1 each into the skin daily. Check blood glucose once a day. Diagnosis code: E 11.9.   calcium carbonate (TUMS - DOSED IN MG ELEMENTAL CALCIUM) 500 MG chewable tablet Chew 1,000 mg by mouth daily as needed for indigestion or heartburn.   celecoxib (CELEBREX) 100 MG capsule Take 1 capsule (100 mg total) by mouth daily as needed.   chlorthalidone (HYGROTON) 25 MG tablet Take 0.5 tablets (12.5 mg total) by mouth daily.   Continuous Blood Gluc Sensor (DEXCOM G6 SENSOR) MISC by Does not apply route.   Continuous Blood Gluc Transmit (DEXCOM G6 TRANSMITTER) MISC by Does not apply route.   dicyclomine (BENTYL) 10 MG capsule Take 1 capsule (10 mg total) by mouth 4 (four) times daily. For abdominal cramping and loose stool (Patient taking differently: Take 10 mg by mouth at bedtime. For abdominal cramping and loose stool)   DULoxetine (CYMBALTA) 60 MG capsule Take 120 mg by mouth every morning. Two capsules (120mg ) daily   empagliflozin (JARDIANCE) 25 MG TABS tablet TAKE 1 TABLET BY MOUTH ONCE DAILY BEFORE BREAKFAST   Exenatide ER (BYDUREON BCISE) 2 MG/0.85ML AUIJ INJECT 2 MG SUBCUTANEOUSLY ONCE A WEEK   gabapentin (NEURONTIN) 300 MG capsule Take 1 capsule (300 mg total) by mouth at bedtime. X 1 week then can increase to 600 mg  QHS if possible- for FMS   glucose blood (ACCU-CHEK GUIDE) test strip 1 each by Other route in the morning, at noon, and at bedtime. Use as instructed   insulin degludec (TRESIBA FLEXTOUCH) 200 UNIT/ML FlexTouch Pen Inject 170 Units into the skin daily.   Insulin Pen Needle (PEN NEEDLES) 32G X 4 MM MISC Check BS 2 x a day   Insulin Pen Needle (RELION PEN NEEDLES) 31G X 6 MM MISC USE 1 PEN NEEDLE ONCE A DAY.   L-Methylfolate 15 MG TABS Take 15 mg by mouth daily at 6 (six) AM.   losartan (COZAAR) 25 MG tablet Take 1 tablet (25 mg total) by mouth daily.   metFORMIN  (GLUCOPHAGE-XR) 500 MG 24 hr tablet Take 2 tablets (1,000 mg total) by mouth daily with breakfast. (Patient taking differently: Take 1,000 mg by mouth daily with supper.)   metoprolol tartrate (LOPRESSOR) 25 MG tablet Take 1 tablet (25 mg total) by mouth 2 (two) times daily.   modafinil (PROVIGIL) 100 MG tablet Take 100 mg by mouth every morning.   Multiple Vitamins-Minerals (MULTIVITAMIN WOMEN 50+) TABS Take 1 tablet by mouth daily.   omega-3 acid ethyl esters (LOVAZA) 1 g capsule Take 2 capsules (2 g total) by mouth 2 (two) times daily.   pantoprazole (PROTONIX) 40 MG tablet Take 1 tablet (40 mg total) by mouth 2 (two) times daily. (Patient taking differently: Take 40 mg by mouth daily.)   potassium chloride SA (KLOR-CON M) 20 MEQ tablet Take 2 tablets (40 mEq total) by mouth 2 (two) times daily. (Patient taking differently: Take 40 mEq by mouth daily.)   rosuvastatin (CRESTOR) 10 MG tablet Take 1 tablet (10 mg total) by mouth daily. (Patient taking differently: Take 10 mg by mouth every other day.)   No facility-administered encounter medications on file as of 06/11/2023.    Allergies (verified) Cephalexin   History: Past Medical History:  Diagnosis Date   Anemia    Anxiety    Arrhythmia    Bipolar depression (HCC) 12/28/2019   Chronic fatigue    Chronic headaches    Depression    Diabetes mellitus without complication (HCC)    Fibromyalgia    GERD (gastroesophageal reflux disease)    High cholesterol    HLD (hyperlipidemia)    Hypertension    Insect bite    Migraine    Prediabetes    Sleep apnea    Urinary incontinence    Vasculitis (HCC)    Past Surgical History:  Procedure Laterality Date   BIOPSY  09/04/2022   Procedure: BIOPSY;  Surgeon: Corbin Ade, MD;  [facility];  Service: Endoscopy;;   CHOLECYSTECTOMY N/A 03/15/2022   Procedure: LAPAROSCOPIC CHOLECYSTECTOMY WITH INTRAOPERATIVE CHOLANGIOGRAM;  Surgeon: Lucretia Roers, MD;  Location: [facility];   Service: General;  Laterality: N/A;   CHOLECYSTECTOMY  02/03/2022   COLONOSCOPY  2017   per patient normal and needs next one in 2027 (Dr. Allena Katz)   COLONOSCOPY WITH PROPOFOL N/A 09/04/2022   Procedure: COLONOSCOPY WITH PROPOFOL;  Surgeon: Corbin Ade, MD;  [facility];  Service: Endoscopy;  Laterality: N/A;   ESOPHAGOGASTRODUODENOSCOPY N/A 05/12/2020   Procedure: ESOPHAGOGASTRODUODENOSCOPY (EGD);  Surgeon: Corbin Ade, MD;  [facility];  Service: Endoscopy;  Laterality: N/A;  11:15am   Joint fusion on foot Right    LIVER BIOPSY N/A 03/15/2022   Procedure: LIVER BIOPSY;  Surgeon: Lucretia Roers, MD;  Location: [facility];  Service: General;  Laterality: N/A;   MALONEY DILATION N/A 05/12/2020   Procedure: Elease Hashimoto DILATION;  Surgeon: Corbin Ade, MD;  [facility];  Service: Endoscopy;  Laterality: N/A;   NASAL SEPTUM SURGERY     POLYPECTOMY  09/04/2022   Procedure: POLYPECTOMY;  Surgeon: Corbin Ade, MD;  [facility];  Service: Endoscopy;;   SINOSCOPY     Family History  Problem Relation Age of Onset   Hypertension Father    Hyperlipidemia Father    Diabetes Father    Stroke Father    Depression Father    Anxiety disorder Father    Hypertension Mother    Hyperlipidemia Mother    Diabetes Mellitus II Mother    Congestive Heart Failure Mother    Heart attack Mother    Hypertension Sister    Diabetes Sister    Hypertension Brother    Autoimmune disease Brother    Diabetes Maternal Grandmother    Lupus Paternal Grandmother    Depression Paternal Grandmother    Anxiety disorder Paternal Grandmother    Depression Maternal Aunt    Anxiety disorder Maternal Aunt    Colon cancer Neg Hx    Esophageal cancer Neg Hx    Gastric cancer Neg Hx    Social History   Socioeconomic History   Marital status: Married    Spouse name: Not on file   Number of children: 0   Years of education: Not on file   Highest education  level: Bachelor's degree (e.g., BA, AB, BS)  Occupational History   Occupation: research associate/drug development  Tobacco Use   Smoking status: Former    Types: Cigarettes    Quit date: 12/30/1982    Years since quitting: 40.4   Smokeless tobacco: Never  Vaping Use   Vaping Use: Never used  Substance and Sexual Activity   Alcohol use: Not Currently    Alcohol/week: 0.0 standard drinks of alcohol   Drug use: Never   Sexual activity: Yes    Birth control/protection: Post-menopausal  Other Topics Concern   Not on file  Social History Narrative   Married for 3 years.Lives with husband.Bachelors in Building surveyor.   Right handed   Caffeine: 1-2 per day    Social Determinants of Health   Financial Resource Strain: Low Risk  (06/11/2023)   Overall Financial Resource Strain (CARDIA)    Difficulty of Paying Living Expenses: Not hard at all  Food Insecurity: No Food Insecurity (06/11/2023)   Hunger Vital Sign    Worried About Running Out of Food in the Last Year: Never true    Ran Out of Food in the Last Year: Never true  Transportation Needs: No Transportation Needs (06/11/2023)   PRAPARE - Administrator, Civil Service (Medical): No    Lack of Transportation (Non-Medical): No  Physical Activity: Inactive (06/11/2023)   Exercise Vital Sign    Days of Exercise per Week: 0 days    Minutes of Exercise per Session: 0 min  Stress: Stress Concern Present (06/11/2023)   [employer] of Occupational Health - Occupational Stress Questionnaire    Feeling of Stress : Very much  Social Connections: Socially Integrated (06/11/2023)   Social Connection and Isolation Panel [NHANES]    Frequency of Communication with Friends and Family: More than three times a week    Frequency of Social Gatherings with Friends and Family: More than three times a week    Attends Religious Services: More than 4 times per year  Active Member of Clubs or Organizations: Yes    Attends Hospital doctor: More than 4 times per year    Marital Status: Married    Tobacco Counseling Counseling given: Yes   Clinical Intake:  Pre-visit preparation completed: Yes  Pain : 0-10 Pain Score: 6  Pain Type: Chronic pain, Neuropathic pain Pain Location: Knee (hips, shoulder blades, elbows) Pain Orientation: Right, Left Pain Descriptors / Indicators: Sore, Other (Comment) ("gripping") Pain Onset: More than a month ago Pain Frequency: Constant     BMI - recorded: 39.46 Nutritional Status: BMI > 30  Obese Nutritional Risks: None Diabetes: Yes CBG done?: No (telephone visit. patient wears a continous blood glucose monitor and checks it frequently) Did pt. bring in CBG monitor from home?: No  How often do you need to have someone help you when you read instructions, pamphlets, or other written materials from your doctor or pharmacy?: 1 - Never  Diabetic? Nutrition Risk Assessment:  Has the patient had any N/V/D within the last 2 months?  No  Does the patient have any non-healing wounds?  No  Has the patient had any unintentional weight loss or weight gain?  No   Diabetes:  Is the patient diabetic?  Yes  If diabetic, was a CBG obtained today?  No  Did the patient bring in their glucometer from home?  No  How often do you monitor your CBG's? CONTINUOUS .   Financial Strains and Diabetes Management:  Are you having any financial strains with the device, your supplies or your medication? No .  Does the patient want to be seen by Chronic Care Management for management of their diabetes?  No  Would the patient like to be referred to a Nutritionist or for Diabetic Management?  No   Diabetic Exams:  Diabetic Eye Exam: Completed 07/10/2022 Diabetic Foot Exam: Completed 03/05/2023   Interpreter Needed?: No  Information entered by ::  Twylia Oka, CMA   Activities of Daily Living    06/11/2023    2:04 PM 08/30/2022    8:16 AM  In your present state of health, do you have any  difficulty performing the following activities:  Hearing? 0   Vision? 0   Difficulty concentrating or making decisions? 0   Walking or climbing stairs? 1   Dressing or bathing? 0   Doing errands, shopping? 0 0  Preparing Food and eating ? N   Using the Toilet? N   In the past six months, have you accidently leaked urine? N   Do you have problems with loss of bowel control? N   Managing your Medications? N   Managing your Finances? N   Housekeeping or managing your Housekeeping? Y   Comment husband helps do the cleaning she isn't able to do alone     Patient Care Team: Anabel Halon, MD as PCP - General (Internal Medicine) Pricilla Riffle, MD as PCP - Cardiology (Cardiology) Jena Gauss Gerrit Friends, MD as Consulting Physician (Gastroenterology) Gavin Pound, [facility] (Inactive) (Pharmacist)  Indicate any recent Medical Services you may have received from other than [facility] providers in the past year (date may be approximate).     Assessment:   This is a routine wellness examination for [place].  Hearing/Vision screen Hearing Screening - Comments:: Patient denies any hearing difficulties.   Vision Screening - Comments:: Wears rx glasses - up to date with routine eye exams with  [facility] in [place]  Dietary issues and exercise activities  discussed: Current Exercise Habits: The patient does not participate in regular exercise at present, Exercise limited by: neurologic condition(s)   Goals Addressed             This Visit's Progress    Patient Stated       Patients goal is to lose weight and lower her A1C       Depression Screen    06/11/2023    1:59 PM 04/25/2023    1:11 PM 03/05/2023    8:21 AM 02/04/2023    1:51 PM 01/30/2023    1:49 PM 01/27/2023    1:19 PM 11/04/2022    3:32 PM  PHQ 2/9 Scores  PHQ - 2 Score 2 4 6 6 6 4 1   PHQ- 9 Score 13  18 16 17 17      Fall Risk    06/11/2023    2:03 PM 04/25/2023    1:11 PM 03/05/2023    8:21 AM 02/04/2023    1:50 PM  01/30/2023    1:49 PM  Fall Risk   Falls in the past year? 0 0 0 0 0  Number falls in past yr: 0 0 0 0 0  Injury with Fall? 0 0 0 0 1  Risk for fall due to : No Fall Risks   No Fall Risks   Follow up Falls prevention discussed   Falls evaluation completed     FALL RISK PREVENTION PERTAINING TO THE HOME:  Any stairs in or around the home? No  If so, are there any without handrails? No  Home free of loose throw rugs in walkways, pet beds, electrical cords, etc? Yes  Adequate lighting in your home to reduce risk of falls? Yes   ASSISTIVE DEVICES UTILIZED TO PREVENT FALLS:  Life alert? No  Use of a cane, walker or w/c? No  Grab bars in the bathroom? No  Shower chair or bench in shower? No  Elevated toilet seat or a handicapped toilet? No   TIMED UP AND GO:  Was the test performed? No .  Cognitive Function:    06/04/2022   12:17 PM  MMSE - Mini Mental State Exam  Not completed: Unable to complete        06/11/2023    2:04 PM 06/04/2022   12:17 PM 05/30/2021    2:07 PM  6CIT Screen  What Year? 0 points 0 points   What month? 0 points 0 points   What time? 0 points 0 points 0 points  Count back from 20 0 points 0 points 0 points  Months in reverse 0 points 0 points 0 points  Repeat phrase 0 points 0 points 0 points  Total Score 0 points 0 points     Immunizations Immunization History  Administered Date(s) Administered   Influenza Whole 10/30/2021   Influenza,inj,Quad PF,6+ Mos 11/01/2019, 10/30/2022   Influenza-Unspecified 10/30/2020   Moderna Covid-19 Vaccine Bivalent Booster 46yrs & up 05/30/2022   Moderna Sars-Covid-2 Vaccination 05/13/2020, 06/23/2020, 07/12/2021   Pneumococcal Polysaccharide-23 12/18/2020   Td 12/30/2020   Zoster Recombinat (Shingrix) 12/30/2020, 05/29/2021    TDAP status: Up to date  Flu Vaccine status: Up to date  Pneumococcal vaccine status: Not age appropriate for this patient  Covid-19 vaccine status: Information provided on how to  obtain vaccines.   Qualifies for Shingles Vaccine? No   Zostavax completed Yes   Shingrix Completed?: Yes  Screening Tests Health Maintenance  Topic Date Due   COVID-19 Vaccine (5 - 2023-24 season)  08/30/2022   OPHTHALMOLOGY EXAM  07/11/2023   INFLUENZA VACCINE  07/31/2023   PAP SMEAR-Modifier  09/30/2023   HEMOGLOBIN A1C  10/29/2023   MAMMOGRAM  01/23/2024   FOOT EXAM  03/04/2024   Diabetic kidney evaluation - eGFR measurement  04/28/2024   Diabetic kidney evaluation - Urine ACR  04/28/2024   Medicare Annual Wellness (AWV)  06/10/2024   DTaP/Tdap/Td (2 - Tdap) 12/30/2030   Colonoscopy  09/04/2032   Hepatitis C Screening  Completed   HIV Screening  Completed   Zoster Vaccines- Shingrix  Completed   HPV VACCINES  Aged Out    Health Maintenance  Health Maintenance Due  Topic Date Due   COVID-19 Vaccine (5 - 2023-24 season) 08/30/2022    Colorectal cancer screening: Type of screening: Colonoscopy. Completed 09/04/22. Repeat every 10 years  Mammogram status: Completed 12/2022. Repeat every year  Bone Density status:  Not age appropriate for this patient  Lung Cancer Screening: (Low Dose CT Chest recommended if Age 106-80 years, 30 pack-year currently smoking OR have quit w/in 15years.) does not qualify.    Additional Screening:  Hepatitis C Screening: does not qualify; Completed 01/30/2021  Vision Screening: Recommended annual ophthalmology exams for early detection of glaucoma and other disorders of the eye. Is the patient up to date with their annual eye exam?  Yes  Who is the provider or what is the name of the office in which the patient attends annual eye exams? [facility]  Dental Screening: Recommended annual dental exams for proper oral hygiene  Community Resource Referral / Chronic Care Management: CRR required this visit?  No   CCM required this visit?  No      Plan:     I have personally reviewed and noted the following in the patient's chart:    Medical and social history Use of alcohol, tobacco or illicit drugs  Current medications and supplements including opioid prescriptions. Patient is not currently taking opioid prescriptions. Functional ability and status Nutritional status Physical activity Advanced directives List of other physicians Hospitalizations, surgeries, and ER visits in previous 12 months Vitals Screenings to include cognitive, depression, and falls Referrals and appointments  In addition, I have reviewed and discussed with patient certain preventive protocols, quality metrics, and best practice recommendations. A written personalized care plan for preventive services as well as general preventive health recommendations were provided to patient.   Due to this being a telephonic visit, the after visit summary with patients personalized plan was offered to patient via mail or my-chart. Patient would like to access their AVS via my-chart    Jordan Hawks Anntoinette Haefele, CMA   06/11/2023   Nurse Notes: Patient is up to date with care gaps

## 2023-06-13 ENCOUNTER — Encounter: Payer: Self-pay | Admitting: Internal Medicine

## 2023-06-17 ENCOUNTER — Other Ambulatory Visit (HOSPITAL_COMMUNITY): Payer: Medicare Other

## 2023-06-20 ENCOUNTER — Ambulatory Visit: Payer: Medicare Other | Admitting: Internal Medicine

## 2023-06-27 ENCOUNTER — Encounter: Payer: Medicare Other | Admitting: Physical Medicine and Rehabilitation

## 2023-06-27 ENCOUNTER — Ambulatory Visit (HOSPITAL_COMMUNITY)
Admission: RE | Admit: 2023-06-27 | Discharge: 2023-06-27 | Disposition: A | Discharge status: Home / Self Care | Payer: Medicare Other | Source: Ambulatory Visit | Attending: Internal Medicine | Admitting: Internal Medicine

## 2023-06-27 ENCOUNTER — Encounter (HOSPITAL_COMMUNITY): Payer: Self-pay

## 2023-06-27 DIAGNOSIS — E782 Mixed hyperlipidemia: Secondary | ICD-10-CM | POA: Insufficient documentation

## 2023-07-07 ENCOUNTER — Ambulatory Visit: Payer: Medicare Other | Admitting: Physical Medicine and Rehabilitation

## 2023-07-17 ENCOUNTER — Other Ambulatory Visit: Payer: Self-pay | Admitting: Endocrinology

## 2023-07-18 ENCOUNTER — Encounter
Payer: Medicare Other | Attending: Physical Medicine and Rehabilitation | Admitting: Physical Medicine and Rehabilitation

## 2023-07-18 ENCOUNTER — Encounter: Payer: Self-pay | Admitting: Physical Medicine and Rehabilitation

## 2023-07-18 VITALS — BP 143/94 | HR 110 | Ht 70.0 in | Wt 279.0 lb

## 2023-07-18 DIAGNOSIS — R5382 Chronic fatigue, unspecified: Secondary | ICD-10-CM | POA: Insufficient documentation

## 2023-07-18 DIAGNOSIS — M797 Fibromyalgia: Secondary | ICD-10-CM | POA: Insufficient documentation

## 2023-07-18 DIAGNOSIS — M7918 Myalgia, other site: Secondary | ICD-10-CM | POA: Insufficient documentation

## 2023-07-18 MED ORDER — LIDOCAINE HCL 1 % IJ SOLN
3.0000 mL | Freq: Once | INTRAMUSCULAR | Status: AC
Start: 2023-07-18 — End: 2023-07-18
  Administered 2023-07-18: 3 mL

## 2023-07-18 MED ORDER — GABAPENTIN 300 MG PO CAPS: 300.0000 mg | ORAL_CAPSULE | Freq: Every day | ORAL | 5 refills | Status: DC

## 2023-07-18 NOTE — Progress Notes (Signed)
Pt is a 57 yr old female with hx of fibromyalgia- since 1985, myofascial pain and chronic low back pain who's being followed by Dr Dallas Schimke, ortho for low back- also has B/L knee DJD- BMI 39.89- down to 39.60 currently; also OSA- uses [facility] - is compliant.    Here for f/u of FMS/myofascial pain and trigger point injections.     Didn't get Theracane yet-   Gabapentin 600 mg- helps her sleep more deeply.  So groggy and sedate din the AM, so went back 300 mg at bedtime to avoid sedation, but still groggy and sedated, just not as much.    Asking about CBD products.  Yesterday was the first time exercises lately.  [facility] center- went Psychiatric nurse- 20 minutes dancing and 20 minutes light weights.  Really sore today.    Plan: 1.  The muscle Burnetta Sabin is at [employer] or [employer]-  or Theracane  at [employer].  You tube has videos on how to use theracane.  So wants to target the primary trigger points which are in neck and shoulder areas, so tennis ball doesn't work as well. TO hold pressure 2-4 minutes not massage with hook or theracane.    2. Suggest Tai Chi- with senior center.   3. Patient here for trigger point injections for  Consent done and on chart.  Cleaned areas with alcohol and injected using a 27 gauge 1.5 inch needle  Injected 2.5cc- 0.5 cc wasted Using 1% Lidocaine with no EPI  Upper traps- B/L Levators Posterior scalenes Middle scalenes- B/L  Splenius Capitus Pectoralis Major- B/L  Rhomboids Infraspinatus Teres Major/minor Thoracic paraspinals Lumbar paraspinals Other injections- 1st dorsal interossei B/L   = Current level of pain after injections is pain a little better after injections  There was no bleeding or complications.  Patient was advised to drink a lot of water on day after injections to flush system Will have increased soreness for 12-48 hours after injections.  Can use Lidocaine patches the day AFTER injections Can use theracane on day of injections in  places didn't inject Can use heating pad 4-6 hours AFTER injections  4. CBD is appropriate  for sleep- suggest trying- it's at legal stores- [facility] or CBD stores-  Since not prescribing opiates, is fine.  Spent awhile discussing this subject.    5. F/U - 2 months- trP injections-   6. Refill gabapentin 300-600 mg nightly for nerve pain   I spent a total of 31   minutes on total care today- >50% coordination of care- due to  8 minutes on injections- rest discussing pain and CBD gummies.

## 2023-07-18 NOTE — Patient Instructions (Signed)
Plan: 1.  The muscle Burnetta Sabin is at [employer] or [employer]-  or Theracane  at [employer].  You tube has videos on how to use theracane.  So wants to target the primary trigger points which are in neck and shoulder areas, so tennis ball doesn't work as well. TO hold pressure 2-4 minutes not massage with hook or theracane.    2. Suggest Tai Chi- with senior center.   3. Patient here for trigger point injections for  Consent done and on chart.  Cleaned areas with alcohol and injected using a 27 gauge 1.5 inch needle  Injected 2.5cc- 0.5 cc wasted Using 1% Lidocaine with no EPI  Upper traps- B/L Levators Posterior scalenes Middle scalenes- B/L  Splenius Capitus Pectoralis Major- B/L  Rhomboids Infraspinatus Teres Major/minor Thoracic paraspinals Lumbar paraspinals Other injections- 1st dorsal interossei B/L   = Current level of pain after injections is pain a little better after injections  There was no bleeding or complications.  Patient was advised to drink a lot of water on day after injections to flush system Will have increased soreness for 12-48 hours after injections.  Can use Lidocaine patches the day AFTER injections Can use theracane on day of injections in places didn't inject Can use heating pad 4-6 hours AFTER injections  4. CBD is appropriate  for sleep- suggest trying- it's at legal stores- [facility] or CBD stores-  Since not prescribing opiates, is fine.  Spent awhile discussing this subject.    5. F/U - 2 months- trP injections-

## 2023-07-21 ENCOUNTER — Ambulatory Visit: Payer: Medicare Other | Attending: Internal Medicine | Admitting: Internal Medicine

## 2023-07-23 ENCOUNTER — Encounter: Payer: Self-pay | Admitting: Internal Medicine

## 2023-07-28 ENCOUNTER — Other Ambulatory Visit: Payer: Self-pay | Admitting: Endocrinology

## 2023-07-28 DIAGNOSIS — E1165 Type 2 diabetes mellitus with hyperglycemia: Secondary | ICD-10-CM

## 2023-07-31 NOTE — Progress Notes (Signed)
Cardiology Office Note   Date:  08/01/2023   ID:  Sydney Taylor, DOB [birth year], MRN [id number]  PCP:  Anabel Halon, MD  Cardiologist:   Dietrich Pates, MD   Pt presents for follow up of palpitations      History of Present Illness: Sydney Taylor is a 57 y.o. female with a history palpitations, OSA, dizziness, DM, bipolar disorder, obesity  Pt has a hx of CP   Has been seen in ER in 2023 Atypical for cardiac  LIpase elevated  Imprved with GI cocktail    I saw the pt in 2023   She had a Ca score CT done   This showed Ca score of 0  There was mild  atherosclerosis   Hepatic steatosis noted as well   Since seen the pt notes an occasional fluttering in chest.  Spells last about 10 to 30 sec   NO dizziness   Not associated with activity.   Has noted more recently  Not too active but joined a senior center   [place] on the treadmill there She says she does have intolerance to heat.   Will get weak  Diet: Breakfast:   Nuts, keto granola cereal and almond milk  1/2 banana    Drink   Coffee with sugar free creamer Lunch:   Varies  Veggie sausages on salads Diner   Variable  Drinks   Water, occasional diet soda  Current Meds  Medication Sig   Acetylcysteine (N-ACETYL CYSTEINE) 600 MG CAPS Take 1 capsule by mouth daily.   ALPRAZolam (XANAX) 0.5 MG tablet Take 0.25 mg by mouth daily as needed for anxiety.   B-12 Methylcobalamin 1000 MCG TBDP Take 1,000 mcg by mouth daily.   blood glucose meter kit and supplies KIT Inject 1 each into the skin daily. Check blood glucose once a day. Diagnosis code: E 11.9.   calcium carbonate (TUMS - DOSED IN MG ELEMENTAL CALCIUM) 500 MG chewable tablet Chew 1,000 mg by mouth daily as needed for indigestion or heartburn.   chlorthalidone (HYGROTON) 25 MG tablet Take 0.5 tablets (12.5 mg total) by mouth daily.   Continuous Blood Gluc Sensor (DEXCOM G6 SENSOR) MISC by Does not apply route.   Continuous Blood Gluc Transmit (DEXCOM G6 TRANSMITTER) MISC by Does  not apply route.   dicyclomine (BENTYL) 10 MG capsule Take 1 capsule (10 mg total) by mouth 4 (four) times daily. For abdominal cramping and loose stool (Patient taking differently: Take 10 mg by mouth at bedtime. For abdominal cramping and loose stool)   DULoxetine (CYMBALTA) 60 MG capsule Take 120 mg by mouth every morning. Two capsules (120mg ) daily   empagliflozin (JARDIANCE) 25 MG TABS tablet TAKE 1 TABLET BY MOUTH ONCE DAILY BEFORE BREAKFAST   Exenatide ER (BYDUREON BCISE) 2 MG/0.85ML AUIJ INJECT 2 MG SUBCUTANEOUSLY ONCE A WEEK   gabapentin (NEURONTIN) 300 MG capsule Take 1 capsule (300 mg total) by mouth at bedtime. X 1 week then can increase to 600 mg QHS if possible- for FMS   glucose blood (ACCU-CHEK GUIDE) test strip 1 each by Other route in the morning, at noon, and at bedtime. Use as instructed   Insulin Pen Needle (PEN NEEDLES) 32G X 4 MM MISC Check BS 2 x a day   Insulin Pen Needle (RELION PEN NEEDLES) 31G X 6 MM MISC USE 1 PEN NEEDLE ONCE A DAY.   L-Methylfolate 15 MG TABS Take 15 mg by mouth daily at 6 (six) AM.  losartan (COZAAR) 25 MG tablet Take 1 tablet (25 mg total) by mouth daily.   metFORMIN (GLUCOPHAGE-XR) 500 MG 24 hr tablet Take 2 tablets (1,000 mg total) by mouth daily with breakfast. (Patient taking differently: Take 1,000 mg by mouth daily with supper.)   metoprolol tartrate (LOPRESSOR) 25 MG tablet Take 1 tablet (25 mg total) by mouth 2 (two) times daily.   modafinil (PROVIGIL) 100 MG tablet Take 100 mg by mouth every morning.   Multiple Vitamins-Minerals (MULTIVITAMIN WOMEN 50+) TABS Take 1 tablet by mouth daily.   omega-3 acid ethyl esters (LOVAZA) 1 g capsule Take 2 capsules (2 g total) by mouth 2 (two) times daily.   pantoprazole (PROTONIX) 40 MG tablet Take 1 tablet (40 mg total) by mouth 2 (two) times daily. (Patient taking differently: Take 40 mg by mouth daily.)   potassium chloride SA (KLOR-CON M) 20 MEQ tablet Take 2 tablets (40 mEq total) by mouth 2 (two)  times daily. (Patient taking differently: Take 40 mEq by mouth daily.)   rosuvastatin (CRESTOR) 10 MG tablet Take 1 tablet (10 mg total) by mouth daily. (Patient taking differently: Take 10 mg by mouth every other day.)   TRESIBA FLEXTOUCH 200 UNIT/ML FlexTouch Pen INJECT 170 UNITS INTO THE SKIN DAILY   h   Allergies:   Cephalexin   Past Medical History:  Diagnosis Date   Anemia    Anxiety    Arrhythmia    Bipolar depression (HCC) 12/28/2019   Chronic fatigue    Chronic headaches    Depression    Diabetes mellitus without complication (HCC)    Fibromyalgia    GERD (gastroesophageal reflux disease)    High cholesterol    HLD (hyperlipidemia)    Hypertension    Insect bite    Migraine    Prediabetes    Sleep apnea    Urinary incontinence    Vasculitis (HCC)     Past Surgical History:  Procedure Laterality Date   BIOPSY  09/04/2022   Procedure: BIOPSY;  Surgeon: Corbin Ade, MD;  [facility];  Service: Endoscopy;;   CHOLECYSTECTOMY N/A 03/15/2022   Procedure: LAPAROSCOPIC CHOLECYSTECTOMY WITH INTRAOPERATIVE CHOLANGIOGRAM;  Surgeon: Lucretia Roers, MD;  Location: [facility];  Service: General;  Laterality: N/A;   CHOLECYSTECTOMY  02/03/2022   COLONOSCOPY  2017   per patient normal and needs next one in 2027 (Dr. Allena Katz)   COLONOSCOPY WITH PROPOFOL N/A 09/04/2022   Procedure: COLONOSCOPY WITH PROPOFOL;  Surgeon: Corbin Ade, MD;  [facility]: [facility];  Service: Endoscopy;  Laterality: N/A;   ESOPHAGOGASTRODUODENOSCOPY N/A 05/12/2020   Procedure: ESOPHAGOGASTRODUODENOSCOPY (EGD);  Surgeon: Corbin Ade, MD;  [facility];  Service: Endoscopy;  Laterality: N/A;  11:15am   Joint fusion on foot Right    LIVER BIOPSY N/A 03/15/2022   Procedure: LIVER BIOPSY;  Surgeon: Lucretia Roers, MD;  Location: [facility];  Service: General;  Laterality: N/A;   MALONEY DILATION N/A 05/12/2020   Procedure: Alvy Beal;  Surgeon: Corbin Ade, MD;   [facility];  Service: Endoscopy;  Laterality: N/A;   NASAL SEPTUM SURGERY     POLYPECTOMY  09/04/2022   Procedure: POLYPECTOMY;  Surgeon: Corbin Ade, MD;  [facility];  Service: Endoscopy;;   SINOSCOPY       Social History:  The patient  reports that she quit smoking about 40 years ago. Her smoking use included cigarettes. She has never used smokeless tobacco.  She reports that she does not currently use alcohol. She reports that she does not use drugs.   Family History:  The patient's family history includes Anxiety disorder in her father, maternal aunt, and paternal grandmother; Autoimmune disease in her brother; Congestive Heart Failure in her mother; Depression in her father, maternal aunt, and paternal grandmother; Diabetes in her father, maternal grandmother, and sister; Diabetes Mellitus II in her mother; Heart attack in her mother; Hyperlipidemia in her father and mother; Hypertension in her brother, father, mother, and sister; Lupus in her paternal grandmother; Stroke in her father.    ROS:  Please see the history of present illness. All other systems are reviewed and  Negative to the above problem except as noted.    PHYSICAL EXAM: VS:  BP 118/78 (BP Location: Left Arm, Patient Position: Sitting, Cuff Size: Normal)   Pulse 91   Ht 5\' 10"  (1.778 m)   Wt 279 lb 12.8 oz (126.9 kg)   LMP  (LMP Unknown)   SpO2 95%   BMI 40.15 kg/m   GEN: Morbidly obese 57 yo  in no acute distress  HEENT: normal  Neck: no JVD, carotid bruit Cardiac: RRR; no murmurs  NO LE  edema  Respiratory:  clear to auscultation  GI: No obvious masses Nontender   MS: no deformity Moving all extremities   Skin: warm and dry, no rash  EKG:  EKG is not ordered today.    Lipid Panel    Component Value Date/Time   CHOL 144 04/29/2023 1435   CHOL 160 01/30/2023 1421   TRIG 299.0 (H) 04/29/2023 1435   HDL 37.50 (L) 04/29/2023 1435   HDL 37 (L) 01/30/2023 1421   CHOLHDL 4  04/29/2023 1435   VLDL 59.8 (H) 04/29/2023 1435   LDLCALC 77 01/30/2023 1421   LDLCALC 133 (H) 01/21/2020 1009   LDLDIRECT 66.0 04/29/2023 1435      Wt Readings from Last 3 Encounters:  08/01/23 279 lb 12.8 oz (126.9 kg)  07/18/23 279 lb (126.6 kg)  06/11/23 275 lb (124.7 kg)      ASSESSMENT AND PLAN:  1  Chest pain   Atypical pain in past   More consistent with GI (pancreatitis)  2  Tachycardia    She had been tachycardic in the past Notes brief spells now    3  Hx HTN   She has some weakness at times   I told her she could take metoprolol at night only   Also could cut back on chlorthalidone   Will need to follow BP and symptoms closely  Resume if BP increases    4   OSA   Pt had been on CPAP    5   Morbid obesity   Discussed diet   Limit carbs, low processed food    Encouraged her to stay active   COnsider starting back on Silver sneakers They have remote sessions. F/U in spring   Current medicines are reviewed at length with the patient today.  The patient does not have concerns regarding medicines.  Signed, Dietrich Pates, MD  08/01/2023 5:33 PM    [facility] [address] [place], [place], [place]  [address] Phone: [phone]; Fax: [phone]

## 2023-08-01 ENCOUNTER — Ambulatory Visit: Payer: Medicare Other | Attending: Internal Medicine | Admitting: Internal Medicine

## 2023-08-01 VITALS — BP 118/78 | HR 91 | Ht 70.0 in | Wt 279.8 lb

## 2023-08-01 DIAGNOSIS — I10 Essential (primary) hypertension: Secondary | ICD-10-CM | POA: Diagnosis not present

## 2023-08-01 NOTE — Patient Instructions (Signed)
Medication Instructions:  Your physician recommends that you continue on your current medications as directed. Please refer to the Current Medication list given to you today.  *If you need a refill on your cardiac medications before your next appointment, please call your pharmacy*   Lab Work: NONE   If you have labs (blood work) drawn today and your tests are completely normal, you will receive your results only by: MyChart Message (if you have MyChart) OR A paper copy in the mail If you have any lab test that is abnormal or we need to change your treatment, we will call you to review the results.   Testing/Procedures: NONE    Follow-Up: At [facility], you and your health needs are our priority.  As part of our continuing mission to provide you with exceptional heart care, we have created designated Provider Care Teams.  These Care Teams include your primary Cardiologist (physician) and Advanced Practice Providers (APPs -  Physician Assistants and Nurse Practitioners) who all work together to provide you with the care you need, when you need it.  We recommend signing up for the patient portal called "MyChart".  Sign up information is provided on this After Visit Summary.  MyChart is used to connect with patients for Virtual Visits (Telemedicine).  Patients are able to view lab/test results, encounter notes, upcoming appointments, etc.  Non-urgent messages can be sent to your provider as well.   To learn more about what you can do with MyChart, go to [email or web address].    Your next appointment:    Spring   Provider:   You may see Dietrich Pates, MD or one of the following Advanced Practice Providers on your designated Care Team:   Randall An, PA-C  Jacolyn Reedy, PA-C     Other Instructions Thank you for choosing  [facility]!

## 2023-08-04 ENCOUNTER — Other Ambulatory Visit: Payer: Self-pay | Admitting: Endocrinology

## 2023-08-11 ENCOUNTER — Other Ambulatory Visit (INDEPENDENT_AMBULATORY_CARE_PROVIDER_SITE_OTHER): Payer: Medicare Other

## 2023-08-11 DIAGNOSIS — E1165 Type 2 diabetes mellitus with hyperglycemia: Secondary | ICD-10-CM | POA: Diagnosis not present

## 2023-08-11 DIAGNOSIS — Z794 Long term (current) use of insulin: Secondary | ICD-10-CM | POA: Diagnosis not present

## 2023-08-13 ENCOUNTER — Other Ambulatory Visit: Payer: Self-pay | Admitting: Gastroenterology

## 2023-08-13 ENCOUNTER — Encounter: Payer: Self-pay | Admitting: "Endocrinology

## 2023-08-13 ENCOUNTER — Other Ambulatory Visit: Payer: Self-pay | Admitting: Internal Medicine

## 2023-08-13 ENCOUNTER — Telehealth (INDEPENDENT_AMBULATORY_CARE_PROVIDER_SITE_OTHER): Payer: Medicare Other | Admitting: "Endocrinology

## 2023-08-13 DIAGNOSIS — Z7985 Long-term (current) use of injectable non-insulin antidiabetic drugs: Secondary | ICD-10-CM

## 2023-08-13 DIAGNOSIS — Z7984 Long term (current) use of oral hypoglycemic drugs: Secondary | ICD-10-CM | POA: Diagnosis not present

## 2023-08-13 DIAGNOSIS — Z794 Long term (current) use of insulin: Secondary | ICD-10-CM

## 2023-08-13 DIAGNOSIS — E1165 Type 2 diabetes mellitus with hyperglycemia: Secondary | ICD-10-CM | POA: Diagnosis not present

## 2023-08-13 DIAGNOSIS — I10 Essential (primary) hypertension: Secondary | ICD-10-CM

## 2023-08-13 DIAGNOSIS — E782 Mixed hyperlipidemia: Secondary | ICD-10-CM

## 2023-08-13 MED ORDER — ROSUVASTATIN CALCIUM 5 MG PO TABS
5.0000 mg | ORAL_TABLET | Freq: Every day | ORAL | 1 refills | Status: DC
Start: 2023-08-13 — End: 2024-04-21

## 2023-08-13 NOTE — Progress Notes (Signed)
The patient reports they are currently: Sydney Taylor. I spent 15 minutes on the video with the patient on the date of service. I spent an additional 10 minutes on pre- and post-visit activities on the date of service.   The patient was physically located in [place] or a state in which I am permitted to provide care. The patient and/or parent/guardian understood that s/he may incur co-pays and cost sharing, and agreed to the telemedicine visit. The visit was reasonable and appropriate under the circumstances given the patient's presentation at the time.  The patient and/or parent/guardian has been advised of the potential risks and limitations of this mode of treatment (including, but not limited to, the absence of in-person examination) and has agreed to be treated using telemedicine. The patient's/patient's family's questions regarding telemedicine have been answered.   The patient and/or parent/guardian has also been advised to contact their provider's office for worsening conditions, and seek emergency medical treatment and/or call 911 if the patient deems either necessary.    Outpatient Endocrinology Note Altamese Mineral, MD  08/13/23   Sydney Taylor 08-24-1966 [id number]  Referring Provider: Anabel Halon, MD Primary Care Provider: Anabel Halon, MD Reason for consultation: Subjective   Assessment & Plan  Diagnoses and all orders for this visit:  Uncontrolled type 2 diabetes mellitus with hyperglycemia (HCC)  Long term (current) use of oral hypoglycemic drugs  Long-term (current) use of injectable non-insulin antidiabetic drugs  Long-term insulin use (HCC)  Mixed hyperlipidemia  Other orders -     rosuvastatin (CRESTOR) 5 MG tablet; Take 1 tablet (5 mg total) by mouth daily.   Diabetes Type II complicated by neuropathy,  Lab Results  Component Value Date   GFR 57.44 (L) 08/11/2023   Hba1c goal less than 7, current Hba1c is  Lab Results  Component Value Date    HGBA1C 7.2 (H) 08/11/2023   Will recommend the following: Jardiance 25 mg daily, metformin ER 1 g at dinner, Bydureon 2 mg weekly Toujeo 85 units twice daily      Will stick to bydueron due to pt's limited tolerance to GLP1  No known contraindications/side effects to any of above medications Glucagon discussed and prescribed with refills on 08/13/23  -Last LD and Tg are as follows: Lab Results  Component Value Date   LDLCALC 77 01/30/2023    Lab Results  Component Value Date   TRIG 299.0 (H) 04/29/2023   -On rosuvastatin 10 mg every other day due to leg cramps  -Instructed to take rosuvastatin 5 mg every day starting 08/14/23 -Follow low fat diet and exercise   -Blood pressure goal <140/90 - Microalbumin/creatinine goal is < 30 -Last MA/Cr is as follows: Lab Results  Component Value Date   MICROALBUR <0.7 04/29/2023   -on ACE/ARB losartan 25 mg qd -diet changes including salt restriction -limit eating outside -counseled BP targets per standards of diabetes care -uncontrolled blood pressure can lead to retinopathy, nephropathy and cardiovascular and atherosclerotic heart disease  Reviewed and counseled on: -A1C target -Blood sugar targets -Complications of uncontrolled diabetes  -Checking blood sugar before meals and bedtime and bring log next visit -All medications with mechanism of action and side effects -Hypoglycemia management: rule of 15's, Glucagon Emergency Kit and medical alert ID -low-carb low-fat plate-method diet -At least 20 minutes of physical activity per day -Annual dilated retinal eye exam and foot exam -compliance and follow up needs -follow up as scheduled or earlier if problem gets worse  Call if blood  sugar is less than 70 or consistently above 250    Take a 15 gm snack of carbohydrate at bedtime before you go to sleep if your blood sugar is less than 100.    If you are going to fast after midnight for a test or procedure, ask your physician  for instructions on how to reduce/decrease your insulin dose.    Call if blood sugar is less than 70 or consistently above 250  -Treating a low sugar by rule of 15  (15 gms of sugar every 15 min until sugar is more than 70) If you feel your sugar is low, test your sugar to be sure If your sugar is low (less than 70), then take 15 grams of a fast acting Carbohydrate (3-4 glucose tablets or glucose gel or 4 ounces of juice or regular soda) Recheck your sugar 15 min after treating low to make sure it is more than 70 If sugar is still less than 70, treat again with 15 grams of carbohydrate          Don't drive the hour of hypoglycemia  If unconscious/unable to eat or drink by mouth, use glucagon injection or nasal spray baqsimi and call 911. Can repeat again in 15 min if still unconscious.  Return in about 4 months (around 12/13/2023).   I have reviewed current medications, nurse's notes, allergies, vital signs, past medical and surgical history, family medical history, and social history for this encounter. Counseled patient on symptoms, examination findings, lab findings, imaging results, treatment decisions and monitoring and prognosis. The patient understood the recommendations and agrees with the treatment plan. All questions regarding treatment plan were fully answered.  Altamese Lewisburg, MD  08/13/23    History of Present Illness ROHNDA TRIMARCHI is a 57 y.o. year old female who presents for evaluation of Type II diabetes mellitus.  CANTRELL MCGINNISS was first diagnosed in 2021.   Diabetes education +  Home diabetes regimen: Jardiance 25 mg daily, metformin ER 1 g at dinner, Bydureon 2 mg weekly Toujeo 170 units daily          Side effects from medications: Nausea and vomiting with Trulicity  Previous history: She had mild hyperglycemia in 2020 but had significant increase in glucose of 484 in 3/21  Non-insulin hypoglycemic drugs previously used: Jardiance since 9/21, Bydureon,  Trulicity, metformin, glipizide Insulin was started in 2022  A1c range in the last few years is: 6.4-12.7  COMPLICATIONS -  MI/Stroke -  retinopathy +  neuropathy +  nephropathy  SYMPTOMS REVIEWED - Polyuria - Weight loss - Blurred vision  BLOOD SUGAR DATA  CGM interpretation: At today's visit, we reviewed her CGM downloads. The full report is scanned in the media. Reviewing the CGM trends, BG are mildly elevated across the day    Physical Exam  LMP  (LMP Unknown)    Constitutional: well developed, well nourished Head: normocephalic, atraumatic Eyes: sclera anicteric, no redness Neck: supple Lungs: normal respiratory effort Neurology: alert and oriented Skin: dry, no appreciable rashes Musculoskeletal: no appreciable defects Psychiatric: normal mood and affect Diabetic Foot Exam - Simple   No data filed      Current Medications Patient's Medications  New Prescriptions   ROSUVASTATIN (CRESTOR) 5 MG TABLET    Take 1 tablet (5 mg total) by mouth daily.  Previous Medications   ACETYLCYSTEINE (N-ACETYL CYSTEINE) 600 MG CAPS    Take 1 capsule by mouth daily.   ALPRAZOLAM (XANAX) 0.5 MG TABLET  Take 0.25 mg by mouth daily as needed for anxiety.   B-12 METHYLCOBALAMIN 1000 MCG TBDP    Take 1,000 mcg by mouth daily.   BLOOD GLUCOSE METER KIT AND SUPPLIES KIT    Inject 1 each into the skin daily. Check blood glucose once a day. Diagnosis code: E 11.9.   CALCIUM CARBONATE (TUMS - DOSED IN MG ELEMENTAL CALCIUM) 500 MG CHEWABLE TABLET    Chew 1,000 mg by mouth daily as needed for indigestion or heartburn.   CELECOXIB (CELEBREX) 100 MG CAPSULE    Take 1 capsule (100 mg total) by mouth daily as needed.   CHLORTHALIDONE (HYGROTON) 25 MG TABLET    Take 0.5 tablets (12.5 mg total) by mouth daily.   CONTINUOUS BLOOD GLUC SENSOR (DEXCOM G6 SENSOR) MISC    by Does not apply route.   CONTINUOUS BLOOD GLUC TRANSMIT (DEXCOM G6 TRANSMITTER) MISC    by Does not apply route.   DICYCLOMINE  (BENTYL) 10 MG CAPSULE    Take 1 capsule (10 mg total) by mouth 4 (four) times daily. For abdominal cramping and loose stool   DULOXETINE (CYMBALTA) 60 MG CAPSULE    Take 120 mg by mouth every morning. Two capsules (120mg ) daily   EMPAGLIFLOZIN (JARDIANCE) 25 MG TABS TABLET    TAKE 1 TABLET BY MOUTH ONCE DAILY BEFORE BREAKFAST   EXENATIDE ER (BYDUREON BCISE) 2 MG/0.85ML AUIJ    INJECT 2 MG SUBCUTANEOUSLY ONCE A WEEK   GABAPENTIN (NEURONTIN) 300 MG CAPSULE    Take 1 capsule (300 mg total) by mouth at bedtime. X 1 week then can increase to 600 mg QHS if possible- for FMS   GLUCOSE BLOOD (ACCU-CHEK GUIDE) TEST STRIP    1 each by Other route in the morning, at noon, and at bedtime. Use as instructed   INSULIN PEN NEEDLE (PEN NEEDLES) 32G X 4 MM MISC    Check BS 2 x a day   INSULIN PEN NEEDLE (RELION PEN NEEDLES) 31G X 6 MM MISC    USE 1 PEN NEEDLE ONCE A DAY.   L-METHYLFOLATE 15 MG TABS    Take 15 mg by mouth daily at 6 (six) AM.   LOSARTAN (COZAAR) 25 MG TABLET    Take 1 tablet (25 mg total) by mouth daily.   METFORMIN (GLUCOPHAGE-XR) 500 MG 24 HR TABLET    Take 2 tablets (1,000 mg total) by mouth daily with breakfast.   METOPROLOL TARTRATE (LOPRESSOR) 25 MG TABLET    Take 1 tablet (25 mg total) by mouth 2 (two) times daily.   MODAFINIL (PROVIGIL) 100 MG TABLET    Take 100 mg by mouth every morning.   MULTIPLE VITAMINS-MINERALS (MULTIVITAMIN WOMEN 50+) TABS    Take 1 tablet by mouth daily.   OMEGA-3 ACID ETHYL ESTERS (LOVAZA) 1 G CAPSULE    Take 2 capsules (2 g total) by mouth 2 (two) times daily.   PANTOPRAZOLE (PROTONIX) 40 MG TABLET    Take 1 tablet (40 mg total) by mouth 2 (two) times daily.   POTASSIUM CHLORIDE SA (KLOR-CON M) 20 MEQ TABLET    Take 2 tablets (40 mEq total) by mouth 2 (two) times daily.   TRESIBA FLEXTOUCH 200 UNIT/ML FLEXTOUCH PEN    INJECT 170 UNITS INTO THE SKIN DAILY  Modified Medications   No medications on file  Discontinued Medications   ROSUVASTATIN (CRESTOR) 10 MG  TABLET    Take 1 tablet (10 mg total) by mouth daily.    Allergies Allergies  Allergen Reactions   Cephalexin Itching, Swelling and Other (See Comments)    Past Medical History Past Medical History:  Diagnosis Date   Anemia    Anxiety    Arrhythmia    Bipolar depression (HCC) 12/28/2019   Chronic fatigue    Chronic headaches    Depression    Diabetes mellitus without complication (HCC)    Fibromyalgia    GERD (gastroesophageal reflux disease)    High cholesterol    HLD (hyperlipidemia)    Hypertension    Insect bite    Migraine    Prediabetes    Sleep apnea    Urinary incontinence    Vasculitis (HCC)     Past Surgical History Past Surgical History:  Procedure Laterality Date   BIOPSY  09/04/2022   Procedure: BIOPSY;  Surgeon: Corbin Ade, MD;  [facility]: [facility];  Service: Endoscopy;;   CHOLECYSTECTOMY N/A 03/15/2022   Procedure: LAPAROSCOPIC CHOLECYSTECTOMY WITH INTRAOPERATIVE CHOLANGIOGRAM;  Surgeon: Lucretia Roers, MD;  Location: [facility];  Service: General;  Laterality: N/A;   CHOLECYSTECTOMY  02/03/2022   COLONOSCOPY  2017   per patient normal and needs next one in 2027 (Dr. Allena Katz)   COLONOSCOPY WITH PROPOFOL N/A 09/04/2022   Procedure: COLONOSCOPY WITH PROPOFOL;  Surgeon: Corbin Ade, MD;  [facility]: [facility];  Service: Endoscopy;  Laterality: N/A;   ESOPHAGOGASTRODUODENOSCOPY N/A 05/12/2020   Procedure: ESOPHAGOGASTRODUODENOSCOPY (EGD);  Surgeon: Corbin Ade, MD;  [facility]: [facility];  Service: Endoscopy;  Laterality: N/A;  11:15am   Joint fusion on foot Right    LIVER BIOPSY N/A 03/15/2022   Procedure: LIVER BIOPSY;  Surgeon: Lucretia Roers, MD;  Location: [facility];  Service: General;  Laterality: N/A;   MALONEY DILATION N/A 05/12/2020   Procedure: Alvy Beal;  Surgeon: Corbin Ade, MD;  [facility]: [facility];  Service: Endoscopy;  Laterality: N/A;   NASAL SEPTUM SURGERY     POLYPECTOMY  09/04/2022   Procedure:  POLYPECTOMY;  Surgeon: Corbin Ade, MD;  Location: [facility];  Service: Endoscopy;;   SINOSCOPY      Family History family history includes Anxiety disorder in her father, maternal aunt, and paternal grandmother; Autoimmune disease in her brother; Congestive Heart Failure in her mother; Depression in her father, maternal aunt, and paternal grandmother; Diabetes in her father, maternal grandmother, and sister; Diabetes Mellitus II in her mother; Heart attack in her mother; Hyperlipidemia in her father and mother; Hypertension in her brother, father, mother, and sister; Lupus in her paternal grandmother; Stroke in her father.  Social History Social History   Socioeconomic History   Marital status: Married    Spouse name: Not on file   Number of children: 0   Years of education: Not on file   Highest education level: Bachelor's degree (e.g., BA, AB, BS)  Occupational History   Occupation: research associate/drug development  Tobacco Use   Smoking status: Former    Current packs/day: 0.00    Types: Cigarettes    Quit date: 12/30/1982    Years since quitting: 40.6   Smokeless tobacco: Never  Vaping Use   Vaping status: Never Used  Substance and Sexual Activity   Alcohol use: Not Currently    Alcohol/week: 0.0 standard drinks of alcohol   Drug use: Never   Sexual activity: Yes    Birth control/protection: Post-menopausal  Other Topics Concern   Not on file  Social History Narrative   Married for 3 years.Lives with  husband.Bachelors in Building surveyor.   Right handed   Caffeine: 1-2 per day    Social Determinants of Health   Financial Resource Strain: Low Risk  (06/11/2023)   Overall Financial Resource Strain (CARDIA)    Difficulty of Paying Living Expenses: Not hard at all  Food Insecurity: No Food Insecurity (06/11/2023)   Hunger Vital Sign    Worried About Running Out of Food in the Last Year: Never true    Ran Out of Food in the Last Year: Never true  Transportation Needs:  No Transportation Needs (06/11/2023)   PRAPARE - Administrator, Civil Service (Medical): No    Lack of Transportation (Non-Medical): No  Physical Activity: Inactive (06/11/2023)   Exercise Vital Sign    Days of Exercise per Week: 0 days    Minutes of Exercise per Session: 0 min  Stress: Stress Concern Present (06/11/2023)   [employer] of Occupational Health - Occupational Stress Questionnaire    Feeling of Stress : Very much  Social Connections: Socially Integrated (06/11/2023)   Social Connection and Isolation Panel [NHANES]    Frequency of Communication with Friends and Family: More than three times a week    Frequency of Social Gatherings with Friends and Family: More than three times a week    Attends Religious Services: More than 4 times per year    Active Member of Clubs or Organizations: Yes    Attends Banker Meetings: More than 4 times per year    Marital Status: Married  Catering manager Violence: Not At Risk (06/11/2023)   Humiliation, Afraid, Rape, and Kick questionnaire    Fear of Current or Ex-Partner: No    Emotionally Abused: No    Physically Abused: No    Sexually Abused: No    Lab Results  Component Value Date   HGBA1C 7.2 (H) 08/11/2023   HGBA1C 7.2 (H) 04/29/2023   HGBA1C 6.6 (H) 01/30/2023   Lab Results  Component Value Date   CHOL 144 04/29/2023   Lab Results  Component Value Date   HDL 37.50 (L) 04/29/2023   Lab Results  Component Value Date   LDLCALC 77 01/30/2023   Lab Results  Component Value Date   TRIG 299.0 (H) 04/29/2023   Lab Results  Component Value Date   CHOLHDL 4 04/29/2023   Lab Results  Component Value Date   CREATININE 1.07 08/11/2023   Lab Results  Component Value Date   GFR 57.44 (L) 08/11/2023   Lab Results  Component Value Date   MICROALBUR <0.7 04/29/2023      Component Value Date/Time   NA 139 08/11/2023 1447   NA 143 01/30/2023 1421   K 4.2 08/11/2023 1447   CL 101 08/11/2023  1447   CO2 25 08/11/2023 1447   GLUCOSE 169 (H) 08/11/2023 1447   BUN 17 08/11/2023 1447   BUN 15 01/30/2023 1421   CREATININE 1.07 08/11/2023 1447   CREATININE 1.03 03/29/2022 1324   CALCIUM 9.4 08/11/2023 1447   PROT 6.3 01/30/2023 1421   ALBUMIN 4.3 01/30/2023 1421   AST 23 01/30/2023 1421   ALT 24 01/30/2023 1421   ALKPHOS 95 01/30/2023 1421   BILITOT 0.3 01/30/2023 1421   GFRNONAA 54 (L) 03/15/2022 0731   GFRNONAA 77 03/19/2021 1457   GFRAA 89 03/19/2021 1457      Latest Ref Rng & Units 08/11/2023    2:47 PM 04/29/2023    2:35 PM 04/01/2023   12:18 PM  BMP  Glucose 70 - 99 mg/dL 161  096    BUN 6 - 23 mg/dL 17  11    Creatinine 0.45 - 1.20 mg/dL 4.09  8.11  9.14   Sodium 135 - 145 mEq/L 139  141    Potassium 3.5 - 5.1 mEq/L 4.2  4.1    Chloride 96 - 112 mEq/L 101  103    CO2 19 - 32 mEq/L 25  26    Calcium 8.4 - 10.5 mg/dL 9.4  9.5         Component Value Date/Time   WBC 10.2 01/30/2023 1421   WBC 11.1 (H) 03/29/2022 1324   RBC 4.93 01/30/2023 1421   RBC 4.65 03/29/2022 1324   HGB 14.0 01/30/2023 1421   HCT 44.2 01/30/2023 1421   PLT 349 01/30/2023 1421   MCV 90 01/30/2023 1421   MCH 28.4 01/30/2023 1421   MCH 29.0 03/29/2022 1324   MCHC 31.7 01/30/2023 1421   MCHC 32.5 03/29/2022 1324   RDW 14.2 01/30/2023 1421   LYMPHSABS 2.3 01/30/2023 1421   MONOABS 0.9 06/03/2020 1707   EOSABS 0.5 (H) 01/30/2023 1421   BASOSABS 0.1 01/30/2023 1421     Parts of this note may have been dictated using voice recognition software. There may be variances in spelling and vocabulary which are unintentional. Not all errors are proofread. Please notify the Thereasa Parkin if any discrepancies are noted or if the meaning of any statement is not clear.

## 2023-08-13 NOTE — Patient Instructions (Signed)

## 2023-08-14 ENCOUNTER — Other Ambulatory Visit: Payer: Self-pay

## 2023-08-14 ENCOUNTER — Encounter: Payer: Self-pay | Admitting: "Endocrinology

## 2023-08-14 DIAGNOSIS — E1165 Type 2 diabetes mellitus with hyperglycemia: Secondary | ICD-10-CM

## 2023-08-14 MED ORDER — PEN NEEDLES 32G X 4 MM MISC
5 refills | Status: DC
Start: 2023-08-14 — End: 2024-03-11

## 2023-08-21 ENCOUNTER — Other Ambulatory Visit: Payer: Self-pay | Admitting: Endocrinology

## 2023-08-22 ENCOUNTER — Ambulatory Visit: Payer: Medicare Other | Admitting: Dietician

## 2023-08-22 ENCOUNTER — Encounter: Payer: Medicare Other | Admitting: Physical Medicine and Rehabilitation

## 2023-09-01 ENCOUNTER — Encounter: Payer: Self-pay | Admitting: Internal Medicine

## 2023-09-02 ENCOUNTER — Other Ambulatory Visit: Payer: Self-pay | Admitting: Internal Medicine

## 2023-09-02 ENCOUNTER — Other Ambulatory Visit: Payer: Self-pay | Admitting: Endocrinology

## 2023-09-02 DIAGNOSIS — E1165 Type 2 diabetes mellitus with hyperglycemia: Secondary | ICD-10-CM

## 2023-09-03 ENCOUNTER — Telehealth: Payer: Self-pay | Admitting: Internal Medicine

## 2023-09-03 ENCOUNTER — Telehealth (INDEPENDENT_AMBULATORY_CARE_PROVIDER_SITE_OTHER): Payer: Medicare Other | Admitting: Family Medicine

## 2023-09-03 ENCOUNTER — Encounter: Payer: Self-pay | Admitting: Family Medicine

## 2023-09-03 VITALS — BP 122/84 | HR 103 | Ht 70.0 in | Wt 274.0 lb

## 2023-09-03 DIAGNOSIS — B349 Viral infection, unspecified: Secondary | ICD-10-CM

## 2023-09-03 MED ORDER — NOREL AD 4-10-325 MG PO TABS
ORAL_TABLET | ORAL | 0 refills | Status: DC
Start: 2023-09-03 — End: 2023-12-16

## 2023-09-03 NOTE — Progress Notes (Signed)
Virtual Visit via Video Note  I connected with Sydney Taylor on 09/06/23 at  3:00 PM EDT by a video enabled telemedicine application and verified that I am speaking with the correct person using two identifiers.  Patient Location: Home Provider Location: Office/Clinic  I discussed the limitations, risks, security, and privacy concerns of performing an evaluation and management service by video and the availability of in person appointments. I also discussed with the patient that there may be a patient responsible charge related to this service. The patient expressed understanding and agreed to proceed.  Subjective: PCP: Anabel Halon, MD  Chief Complaint  Patient presents with   Nasal Congestion    Pt reports since Saturday, extreme fatigue, congestion, runny nose, slight tightness In chest. Took a covid test and was neg.    HPI The patient presents with symptoms of nasal congestion, runny nose, and mild fatigue. She reports conducting a home COVID-19 test, which returned negative results. However, due to upcoming travel plans, she has requested to be retested to confirm her status. She denies experiencing fever, cough, headaches, body aches, nausea, or vomiting.    ROS: Per HPI  Current Outpatient Medications:    Acetylcysteine (N-ACETYL CYSTEINE) 600 MG CAPS, Take 1 capsule by mouth daily., Disp: , Rfl:    ALPRAZolam (XANAX) 0.5 MG tablet, Take 0.25 mg by mouth daily as needed for anxiety., Disp: , Rfl:    B-12 Methylcobalamin 1000 MCG TBDP, Take 1,000 mcg by mouth daily., Disp: , Rfl:    blood glucose meter kit and supplies KIT, Inject 1 each into the skin daily. Check blood glucose once a day. Diagnosis code: E 11.9., Disp: 1 each, Rfl: 0   calcium carbonate (TUMS - DOSED IN MG ELEMENTAL CALCIUM) 500 MG chewable tablet, Chew 1,000 mg by mouth daily as needed for indigestion or heartburn., Disp: , Rfl:    celecoxib (CELEBREX) 100 MG capsule, Take 1 capsule (100 mg total) by  mouth daily as needed., Disp: 30 capsule, Rfl: 2   Chlorphen-PE-Acetaminophen (NOREL AD) 4-10-325 MG TABS, Take 1 tablet every 4 hours while symptoms persists. Do not take more than 6 tablets in 24 hours., Disp: 20 tablet, Rfl: 0   chlorthalidone (HYGROTON) 25 MG tablet, Take 0.5 tablets (12.5 mg total) by mouth daily., Disp: 45 tablet, Rfl: 1   Continuous Blood Gluc Sensor (DEXCOM G6 SENSOR) MISC, by Does not apply route., Disp: , Rfl:    Continuous Blood Gluc Transmit (DEXCOM G6 TRANSMITTER) MISC, by Does not apply route., Disp: , Rfl:    dicyclomine (BENTYL) 10 MG capsule, TAKE 1 CAPSULE BY MOUTH 4 TIMES DAILY BEFORE MEAL(S) AND  AT BEDTIME FOR ABDOMINAL CRAMPING AND LOOSE STOOL., Disp: 120 capsule, Rfl: 3   DULoxetine (CYMBALTA) 60 MG capsule, Take 120 mg by mouth every morning. Two capsules (120mg ) daily, Disp: , Rfl:    empagliflozin (JARDIANCE) 25 MG TABS tablet, TAKE 1 TABLET BY MOUTH ONCE DAILY BEFORE BREAKFAST, Disp: 30 tablet, Rfl: 3   Exenatide ER (BYDUREON BCISE) 2 MG/0.85ML AUIJ, INJECT 2 MG SUBCUTANEOUSLY ONCE A WEEK, Disp: 4 mL, Rfl: 1   gabapentin (NEURONTIN) 300 MG capsule, Take 1 capsule (300 mg total) by mouth at bedtime. X 1 week then can increase to 600 mg QHS if possible- for FMS, Disp: 60 capsule, Rfl: 5   glucose blood (ACCU-CHEK GUIDE) test strip, 1 each by Other route in the morning, at noon, and at bedtime. Use as instructed, Disp: 300 each, Rfl: 3  Insulin Pen Needle (PEN NEEDLES) 32G X 4 MM MISC, Check BS 2 x a day, Disp: 200 each, Rfl: 5   Insulin Pen Needle (RELION PEN NEEDLES) 31G X 6 MM MISC, USE 1 PEN NEEDLE ONCE A DAY., Disp: 50 each, Rfl: 5   L-Methylfolate 15 MG TABS, Take 15 mg by mouth daily at 6 (six) AM., Disp: , Rfl:    losartan (COZAAR) 25 MG tablet, Take 1 tablet by mouth once daily, Disp: 90 tablet, Rfl: 0   metFORMIN (GLUCOPHAGE-XR) 500 MG 24 hr tablet, Take 2 tablets (1,000 mg total) by mouth daily with supper., Disp: 180 tablet, Rfl: 1   metoprolol  tartrate (LOPRESSOR) 25 MG tablet, Take 1 tablet (25 mg total) by mouth 2 (two) times daily., Disp: 180 tablet, Rfl: 1   modafinil (PROVIGIL) 100 MG tablet, Take 100 mg by mouth every morning., Disp: , Rfl:    Multiple Vitamins-Minerals (MULTIVITAMIN WOMEN 50+) TABS, Take 1 tablet by mouth daily., Disp: , Rfl:    omega-3 acid ethyl esters (LOVAZA) 1 g capsule, Take 2 capsules (2 g total) by mouth 2 (two) times daily., Disp: 120 capsule, Rfl: 5   potassium chloride SA (KLOR-CON M) 20 MEQ tablet, Take 2 tablets (40 mEq total) by mouth 2 (two) times daily. (Patient taking differently: Take 40 mEq by mouth daily.), Disp: 720 tablet, Rfl: 0   rosuvastatin (CRESTOR) 5 MG tablet, Take 1 tablet (5 mg total) by mouth daily., Disp: 90 tablet, Rfl: 1   TRESIBA FLEXTOUCH 200 UNIT/ML FlexTouch Pen, INJECT 170 UNITS SUBCUTANEOUSLY ONCE DAILY, Disp: 18 mL, Rfl: 0   pantoprazole (PROTONIX) 40 MG tablet, Take 1 tablet (40 mg total) by mouth daily., Disp: 90 tablet, Rfl: 1  Observations/Objective: Today's Vitals   09/03/23 1453  BP: 122/84  Pulse: (!) 103  Weight: 274 lb (124.3 kg)  Height: 5\' 10"  (1.778 m)   Physical Exam Patient is well-developed, well-nourished in no acute distress.  Resting comfortably at home.  Head is normocephalic, atraumatic.  No labored breathing.  Speech is clear and coherent with logical content.  Patient is alert and oriented at baseline.   Assessment and Plan: Viral illness -     Norel AD; Take 1 tablet every 4 hours while symptoms persists. Do not take more than 6 tablets in 24 hours.  Dispense: 20 tablet; Refill: 0 -     COVID-19, Flu A+B and RSV  Take medication as prescribed. Increase fluids and allow for plenty of rest. Recommend Tylenol as needed for pain, fever, or general discomfort. Warm salt water gargles 3-4 times daily to help with throat pain or discomfort. Recommend using a humidifier at bedtime during sleep to help with cough and nasal  congestion. Follow-up if your symptoms do not improve    Follow Up Instructions: No follow-ups on file.   I discussed the assessment and treatment plan with the patient. The patient was provided an opportunity to ask questions, and all were answered. The patient agreed with the plan and demonstrated an understanding of the instructions.   The patient was advised to call back or seek an in-person evaluation if the symptoms worsen or if the condition fails to improve as anticipated.  The above assessment and management plan was discussed with the patient. The patient verbalized understanding of and has agreed to the management plan.   Gilmore Laroche, FNP

## 2023-09-03 NOTE — Telephone Encounter (Signed)
Patient sees [facility] in [place]

## 2023-09-03 NOTE — Telephone Encounter (Signed)
Faxed for records

## 2023-09-06 ENCOUNTER — Encounter: Payer: Self-pay | Admitting: Family Medicine

## 2023-09-07 LAB — COVID-19, FLU A+B AND RSV
Influenza A, NAA: NOT DETECTED
Influenza B, NAA: NOT DETECTED
RSV, NAA: NOT DETECTED
SARS-CoV-2, NAA: NOT DETECTED

## 2023-09-08 ENCOUNTER — Ambulatory Visit: Payer: Medicare Other | Admitting: Internal Medicine

## 2023-09-09 ENCOUNTER — Encounter: Payer: Self-pay | Admitting: Internal Medicine

## 2023-09-09 ENCOUNTER — Other Ambulatory Visit: Payer: Self-pay | Admitting: Internal Medicine

## 2023-09-09 DIAGNOSIS — J0191 Acute recurrent sinusitis, unspecified: Secondary | ICD-10-CM

## 2023-09-09 MED ORDER — AMOXICILLIN-POT CLAVULANATE 875-125 MG PO TABS
1.0000 | ORAL_TABLET | Freq: Two times a day (BID) | ORAL | 0 refills | Status: DC
Start: 2023-09-09 — End: 2023-09-19

## 2023-09-15 ENCOUNTER — Encounter: Payer: Medicare Other | Admitting: Physical Medicine and Rehabilitation

## 2023-09-15 ENCOUNTER — Encounter: Payer: Self-pay | Admitting: "Endocrinology

## 2023-09-16 ENCOUNTER — Ambulatory Visit (INDEPENDENT_AMBULATORY_CARE_PROVIDER_SITE_OTHER): Payer: Self-pay | Admitting: Neurology

## 2023-09-16 ENCOUNTER — Ambulatory Visit: Payer: Medicare Other | Admitting: Internal Medicine

## 2023-09-16 ENCOUNTER — Encounter: Payer: Self-pay | Admitting: Neurology

## 2023-09-16 ENCOUNTER — Telehealth: Payer: Self-pay | Admitting: Neurology

## 2023-09-16 DIAGNOSIS — Z91199 Patient's noncompliance with other medical treatment and regimen due to unspecified reason: Secondary | ICD-10-CM

## 2023-09-16 NOTE — Progress Notes (Signed)
[facility]  09/16/2023: we have not seen patient for close to 3 years. She is scheduled for follo wup but Is sick, below precharting preserved for future appointment.   Provider:  Dr Lucia Gaskins Requesting Provider: Anabel Halon, MD Primary Care Provider:  Anabel Halon, MD  CC:  Unsteadiness   09/16/2023: we have not seen patient for close to 3 years. She is scheduled for follo wup but Is sick, below precharting preserved for future appointment.   HPI 12/13/2020:  Sydney Taylor is a 57 y.o. female here as requested by Anabel Halon, MD for dizziness. PMHx sleep panea, prediabetes, migraine, HTN, HLD, Fibromyalgia, depression, bipolar disease, anxiety, anemia. Patient says the main problem she has is unsteadiness in er legs, better if walking fast, no falls, no weakness, been ongoing "a while" maybe a year, she feels she has to concentrate to go ina straight line, Uncle with parkinson's, no tremors, no stiffness, she has Pain "pretty much constantly" but no neck pain or shooting pain in the arms or legs, she has not been dropping things, No significant numbness or tingling, no tremor, no falls. Episodes are random, she does not feel it today. Mostly if she Is walking slowly, walking fast is better. No problems walking. Not lightheaded. No other focal neurologic deficits, associated symptoms, inciting events or modifiable factors.  Reviewed notes, labs and imaging from outside physicians, which showed o  B12 300, cbc/bmp unremarkable, ana negative, RF neg, crp normal, sed rate normal, tsh   MRI of the brain wo contrast 08/05/2020: personally reviewed images and agree with findings below  IMPRESSION: Negative MRI head no acute abnormality   Mild hyperintensity in the pons and in the white matter which is likely due to mild chronic microvascular ischemia.  Review of Systems: Patient complains of symptoms per HPI as well as the following symptoms: dizziness, insomnia,  headache, feeling hot, joint pain, aching muscles, fatigue, blurriness. Pertinent negatives and positives per HPI. All others negative.  Assessment/Plan:  09/16/2023: we have not seen patient for close to 3 years. She is scheduled for follo wup but Is sick, below precharting preserved for future appointment.    57 y.o. female here as requested by [name], NP for dizziness. PMHx sleep panea, prediabetes, migraine, HTN, HLD, Fibromyalgia, depression, bipolar disease, anxiety, anemia. Patient says the main problem she has is unsteadiness. Dizziness is "rare". She denies weakness, sensory changes, falls, lightheadedness, tremor, or any other associated symptom. Exam showed some imbalance with heel-to-toe and mild decreased arm swing on the right but normal stance and stride, tone normal, strength intact, no abnormal movements, gets up without use of hands. She does have brisk reflexes so will check an MRI of her cervical spine. She does not practice any balance exercises at home so will send to PT for balance.   - Physical Therapy: [facility] in [place] - MRI Cervical Spine in [place] - I also discussed the mild white matter changes in the brain and discussed keeping vascular risk factors tightly controlled - I discussed with patient if she feels improved with PT she can follow up as needed. But if she has new symptoms or has worsening of unsteadiness to email me and we will have her back to the office to monitor clinically or perform more testing. She agreed.   09/16/2023: we have not seen patient for close to 3 years. She is scheduled for follo wup but Is sick, above precharting preserved for future appointment.  No orders of the defined types were placed in this encounter.   Cc: Anabel Halon, MD,  Anabel Halon, MD  [name], MD  [facility] [address] [place], [place] [address]  Phone [phone] Fax [phone]

## 2023-09-16 NOTE — Telephone Encounter (Signed)
REQUIRED PHONE NOTE: this is just FYI for POD 4, at 8:31 pt left a vm that she is sick with a fever and will not make appointment today.

## 2023-09-17 ENCOUNTER — Other Ambulatory Visit: Payer: Self-pay

## 2023-09-17 DIAGNOSIS — E1165 Type 2 diabetes mellitus with hyperglycemia: Secondary | ICD-10-CM

## 2023-09-17 MED ORDER — EMPAGLIFLOZIN 25 MG PO TABS: 25.0000 mg | ORAL_TABLET | Freq: Every day | ORAL | 3 refills | Status: DC

## 2023-09-18 ENCOUNTER — Ambulatory Visit: Payer: Medicare Other | Admitting: Internal Medicine

## 2023-09-19 ENCOUNTER — Encounter: Payer: Self-pay | Admitting: Internal Medicine

## 2023-09-19 ENCOUNTER — Ambulatory Visit (INDEPENDENT_AMBULATORY_CARE_PROVIDER_SITE_OTHER): Payer: Medicare Other | Admitting: Internal Medicine

## 2023-09-19 VITALS — BP 113/76 | HR 101 | Ht 70.0 in | Wt 283.2 lb

## 2023-09-19 DIAGNOSIS — E1142 Type 2 diabetes mellitus with diabetic polyneuropathy: Secondary | ICD-10-CM

## 2023-09-19 DIAGNOSIS — M797 Fibromyalgia: Secondary | ICD-10-CM | POA: Diagnosis not present

## 2023-09-19 DIAGNOSIS — J3089 Other allergic rhinitis: Secondary | ICD-10-CM

## 2023-09-19 DIAGNOSIS — F331 Major depressive disorder, recurrent, moderate: Secondary | ICD-10-CM

## 2023-09-19 DIAGNOSIS — Z23 Encounter for immunization: Secondary | ICD-10-CM | POA: Diagnosis not present

## 2023-09-19 DIAGNOSIS — L509 Urticaria, unspecified: Secondary | ICD-10-CM

## 2023-09-19 DIAGNOSIS — Z794 Long term (current) use of insulin: Secondary | ICD-10-CM

## 2023-09-19 MED ORDER — CYCLOBENZAPRINE HCL 5 MG PO TABS
5.0000 mg | ORAL_TABLET | Freq: Two times a day (BID) | ORAL | 1 refills | Status: DC | PRN
Start: 2023-09-19 — End: 2024-09-08

## 2023-09-19 MED ORDER — AZELASTINE HCL 0.1 % NA SOLN: 2.0000 | Freq: Two times a day (BID) | NASAL | 12 refills | Status: DC

## 2023-09-19 NOTE — Assessment & Plan Note (Signed)
Her current symptoms are likely due to allergic sinusitis Continue Claritin Added azelastine nasal spray Has been seen by ENT specialist and allergy specialist

## 2023-09-19 NOTE — Assessment & Plan Note (Signed)
On Wellbutrin and Cymbalta Followed by psychiatry - [employer]

## 2023-09-19 NOTE — Assessment & Plan Note (Addendum)
  Lab Results  Component Value Date   HGBA1C 7.2 (H) 08/11/2023   Overall well controlled compared to prior On Jardiance and Bydureon, can discuss with Endocrinology to switch to [facility] On metformin XR 1000 mg QD and Toujeo 170 units in 2 divided doses Followed by endocrinology Advised to follow diabetic diet On ARB and statin F/u CMP and lipid panel Diabetic eye exam: Advised to follow up with Ophthalmology for diabetic eye exam

## 2023-09-19 NOTE — Patient Instructions (Addendum)
Please start Flexeril as needed for muscle spasms or acute myalgias.  Continue taking Claritin for allergies. Use Azelastine nasal spray for nasal congestion.  Please continue to take medications as prescribed.  Please continue to follow low carb diet and perform moderate exercise/walking at least 150 mins/week.

## 2023-09-19 NOTE — Assessment & Plan Note (Signed)
Hives or skin eruptions likely urticaria Improved with Claritin Advised to keep food diary She had lip swelling on 1 day, likely angioedema-like reaction, but did not have difficulty breathing or swallowing Check CBC, CMP and tryptase level

## 2023-09-19 NOTE — Assessment & Plan Note (Signed)
On Cymbalta for depression, which can also help with fibromyalgia Has had PT after rheumatology evaluation On gabapentin 300 mg nightly for diabetic neuropathy Added Flexeril as needed for muscle spasms

## 2023-09-19 NOTE — Progress Notes (Signed)
Established Patient Office Visit  Subjective:  Patient ID: Sydney Taylor, female    DOB: Sep 20, 1966  Age: 57 y.o. MRN: [id number]  CC:  Chief Complaint  Patient presents with   Diabetes    Follow up    Urticaria    Patient been breaking out in hives , Claritin helps but they still come back    URI    Still having effects from a URI from three weeks ago     HPI Sydney Taylor is a 57 y.o. female with past medical history of OSA, GERD, type II DM with diabetic neuropathy, HLD, tachycardia, depression with anxiety, fibromyalgia and obesity who presents for f/u of her chronic medical conditions.  She still has chronic nasal congestion, postnasal drip, sore throat and dry cough. She recently completed Augmentin for acute sinusitis.  She is taking Claritin. She has had ENT and allergy specialist evaluation in the past.  She denies any fever or chills currently. She also reports generalized hives and lip swelling about a week ago, which has resolved with Claritin.  She has had intermittent hives for the last 1 week, which improve with Claritin.  Denies any recent change in medicine.  No recent change in soap, shampoo, detergent or cleaning supplies reported.   Type II DM: She is on Toujeo 170 units in 2 divided doses, metformin 1000 mg daily and Jardiance 25 mg daily. Followed by endocrinology.  She has CGM now.  She has chronic fatigue, but denies any polyuria or polyphagia.   Bipolar depression: She sees [facility] for it.  She is on Cymbalta and modafinil currently.  She still has chronic fatigue and insomnia.  She has had suicidal thoughts in the past, but denies any plan.   Fibromyalgia: She has chronic myalgias and takes Cymbalta and gabapentin currently.  She reports recent worsening of myalgias.  She has tried Lyrica in the past as well.    Past Medical History:  Diagnosis Date   Anemia    Anxiety    Arrhythmia    Bipolar depression (HCC) 12/28/2019   Chronic fatigue     Chronic headaches    Depression    Diabetes mellitus without complication (HCC)    Fibromyalgia    GERD (gastroesophageal reflux disease)    High cholesterol    HLD (hyperlipidemia)    Hypertension    Insect bite    Migraine    Prediabetes    Sleep apnea    Urinary incontinence    Vasculitis (HCC)     Past Surgical History:  Procedure Laterality Date   BIOPSY  09/04/2022   Procedure: BIOPSY;  Surgeon: Corbin Ade, MD;  Location: [facility];  Service: Endoscopy;;   CHOLECYSTECTOMY N/A 03/15/2022   Procedure: LAPAROSCOPIC CHOLECYSTECTOMY WITH INTRAOPERATIVE CHOLANGIOGRAM;  Surgeon: Lucretia Roers, MD;  Location: [facility];  Service: General;  Laterality: N/A;   CHOLECYSTECTOMY  02/03/2022   COLONOSCOPY  2017   per patient normal and needs next one in 2027 (Dr. Allena Katz)   COLONOSCOPY WITH PROPOFOL N/A 09/04/2022   Procedure: COLONOSCOPY WITH PROPOFOL;  Surgeon: Corbin Ade, MD;  [facility]: [facility];  Service: Endoscopy;  Laterality: N/A;   ESOPHAGOGASTRODUODENOSCOPY N/A 05/12/2020   Procedure: ESOPHAGOGASTRODUODENOSCOPY (EGD);  Surgeon: Corbin Ade, MD;  [facility]: [facility];  Service: Endoscopy;  Laterality: N/A;  11:15am   Joint fusion on foot Right    LIVER BIOPSY N/A 03/15/2022   Procedure: LIVER BIOPSY;  Surgeon: Henreitta Leber,  Leatrice Jewels, MD;  Location: [facility];  Service: General;  Laterality: N/A;   MALONEY DILATION N/A 05/12/2020   Procedure: Alvy Beal;  Surgeon: Corbin Ade, MD;  [facility]: [facility];  Service: Endoscopy;  Laterality: N/A;   NASAL SEPTUM SURGERY     POLYPECTOMY  09/04/2022   Procedure: POLYPECTOMY;  Surgeon: Corbin Ade, MD;  Location: [facility];  Service: Endoscopy;;   SINOSCOPY      Family History  Problem Relation Age of Onset   Hypertension Father    Hyperlipidemia Father    Diabetes Father    Stroke Father    Depression Father    Anxiety disorder Father    Hypertension Mother    Hyperlipidemia Mother     Diabetes Mellitus II Mother    Congestive Heart Failure Mother    Heart attack Mother    Hypertension Sister    Diabetes Sister    Hypertension Brother    Autoimmune disease Brother    Diabetes Maternal Grandmother    Lupus Paternal Grandmother    Depression Paternal Grandmother    Anxiety disorder Paternal Grandmother    Depression Maternal Aunt    Anxiety disorder Maternal Aunt    Colon cancer Neg Hx    Esophageal cancer Neg Hx    Gastric cancer Neg Hx     Social History   Socioeconomic History   Marital status: Married    Spouse name: Not on file   Number of children: 0   Years of education: Not on file   Highest education level: Bachelor's degree (e.g., BA, AB, BS)  Occupational History   Occupation: research associate/drug development  Tobacco Use   Smoking status: Former    Current packs/day: 0.00    Types: Cigarettes    Quit date: 12/30/1982    Years since quitting: 40.7   Smokeless tobacco: Never  Vaping Use   Vaping status: Never Used  Substance and Sexual Activity   Alcohol use: Not Currently    Alcohol/week: 0.0 standard drinks of alcohol   Drug use: Never   Sexual activity: Yes    Birth control/protection: Post-menopausal  Other Topics Concern   Not on file  Social History Narrative   Married for 3 years.Lives with husband.Bachelors in Building surveyor.   Right handed   Caffeine: 1-2 per day    Social Determinants of Health   Financial Resource Strain: Low Risk  (09/18/2023)   Overall Financial Resource Strain (CARDIA)    Difficulty of Paying Living Expenses: Not very hard  Food Insecurity: No Food Insecurity (09/18/2023)   Hunger Vital Sign    Worried About Running Out of Food in the Last Year: Never true    Ran Out of Food in the Last Year: Never true  Transportation Needs: No Transportation Needs (09/18/2023)   PRAPARE - Administrator, Civil Service (Medical): No    Lack of Transportation (Non-Medical): No  Physical Activity:  Insufficiently Active (09/18/2023)   Exercise Vital Sign    Days of Exercise per Week: 1 day    Minutes of Exercise per Session: 30 min  Stress: Stress Concern Present (09/18/2023)   [employer] of Occupational Health - Occupational Stress Questionnaire    Feeling of Stress : To some extent  Social Connections: Unknown (09/18/2023)   Social Connection and Isolation Panel [NHANES]    Frequency of Communication with Friends and Family: Twice a week    Frequency of Social Gatherings with Friends and Family: Patient declined  Attends Religious Services: 1 to 4 times per year    Active Member of Clubs or Organizations: No    Attends Banker Meetings: More than 4 times per year    Marital Status: Married  Catering manager Violence: Not At Risk (06/11/2023)   Humiliation, Afraid, Rape, and Kick questionnaire    Fear of Current or Ex-Partner: No    Emotionally Abused: No    Physically Abused: No    Sexually Abused: No    Outpatient Medications Prior to Visit  Medication Sig Dispense Refill   Acetylcysteine (N-ACETYL CYSTEINE) 600 MG CAPS Take 1 capsule by mouth daily.     ALPRAZolam (XANAX) 0.5 MG tablet Take 0.25 mg by mouth daily as needed for anxiety.     B-12 Methylcobalamin 1000 MCG TBDP Take 1,000 mcg by mouth daily.     blood glucose meter kit and supplies KIT Inject 1 each into the skin daily. Check blood glucose once a day. Diagnosis code: E 11.9. 1 each 0   calcium carbonate (TUMS - DOSED IN MG ELEMENTAL CALCIUM) 500 MG chewable tablet Chew 1,000 mg by mouth daily as needed for indigestion or heartburn.     celecoxib (CELEBREX) 100 MG capsule Take 1 capsule (100 mg total) by mouth daily as needed. 30 capsule 2   Chlorphen-PE-Acetaminophen (NOREL AD) 4-10-325 MG TABS Take 1 tablet every 4 hours while symptoms persists. Do not take more than 6 tablets in 24 hours. 20 tablet 0   chlorthalidone (HYGROTON) 25 MG tablet Take 0.5 tablets (12.5 mg total) by mouth daily. 45  tablet 1   Continuous Blood Gluc Sensor (DEXCOM G6 SENSOR) MISC by Does not apply route.     Continuous Blood Gluc Transmit (DEXCOM G6 TRANSMITTER) MISC by Does not apply route.     dicyclomine (BENTYL) 10 MG capsule TAKE 1 CAPSULE BY MOUTH 4 TIMES DAILY BEFORE MEAL(S) AND  AT BEDTIME FOR ABDOMINAL CRAMPING AND LOOSE STOOL. 120 capsule 3   DULoxetine (CYMBALTA) 60 MG capsule Take 120 mg by mouth every morning. Two capsules (120mg ) daily     empagliflozin (JARDIANCE) 25 MG TABS tablet Take 1 tablet (25 mg total) by mouth daily before breakfast. 30 tablet 3   Exenatide ER (BYDUREON BCISE) 2 MG/0.85ML AUIJ INJECT 2 MG SUBCUTANEOUSLY ONCE A WEEK 4 mL 1   gabapentin (NEURONTIN) 300 MG capsule Take 1 capsule (300 mg total) by mouth at bedtime. X 1 week then can increase to 600 mg QHS if possible- for FMS 60 capsule 5   glucose blood (ACCU-CHEK GUIDE) test strip 1 each by Other route in the morning, at noon, and at bedtime. Use as instructed 300 each 3   Insulin Pen Needle (PEN NEEDLES) 32G X 4 MM MISC Check BS 2 x a day 200 each 5   Insulin Pen Needle (RELION PEN NEEDLES) 31G X 6 MM MISC USE 1 PEN NEEDLE ONCE A DAY. 50 each 5   L-Methylfolate 15 MG TABS Take 15 mg by mouth daily at 6 (six) AM.     losartan (COZAAR) 25 MG tablet Take 1 tablet by mouth once daily 90 tablet 0   metFORMIN (GLUCOPHAGE-XR) 500 MG 24 hr tablet Take 2 tablets (1,000 mg total) by mouth daily with supper. 180 tablet 1   metoprolol tartrate (LOPRESSOR) 25 MG tablet Take 1 tablet (25 mg total) by mouth 2 (two) times daily. 180 tablet 1   modafinil (PROVIGIL) 100 MG tablet Take 100 mg by mouth every morning.  Multiple Vitamins-Minerals (MULTIVITAMIN WOMEN 50+) TABS Take 1 tablet by mouth daily.     omega-3 acid ethyl esters (LOVAZA) 1 g capsule Take 2 capsules (2 g total) by mouth 2 (two) times daily. 120 capsule 5   pantoprazole (PROTONIX) 40 MG tablet Take 1 tablet (40 mg total) by mouth daily. 90 tablet 1   potassium chloride  SA (KLOR-CON M) 20 MEQ tablet Take 2 tablets (40 mEq total) by mouth 2 (two) times daily. (Patient taking differently: Take 40 mEq by mouth daily.) 720 tablet 0   rosuvastatin (CRESTOR) 5 MG tablet Take 1 tablet (5 mg total) by mouth daily. 90 tablet 1   TRESIBA FLEXTOUCH 200 UNIT/ML FlexTouch Pen INJECT 170 UNITS SUBCUTANEOUSLY ONCE DAILY 18 mL 0   amoxicillin-clavulanate (AUGMENTIN) 875-125 MG tablet Take 1 tablet by mouth 2 (two) times daily. 14 tablet 0   No facility-administered medications prior to visit.    Allergies  Allergen Reactions   Cephalexin Itching, Swelling and Other (See Comments)    ROS Review of Systems  Constitutional:  Positive for fatigue. Negative for chills and fever.  HENT:  Positive for congestion and sinus pressure. Negative for ear discharge.   Respiratory:  Negative for cough, shortness of breath and wheezing.   Cardiovascular:  Negative for chest pain and palpitations.  Gastrointestinal:  Negative for diarrhea and vomiting.  Genitourinary:  Negative for dysuria and hematuria.  Musculoskeletal:  Positive for back pain. Negative for neck pain and neck stiffness.  Skin:  Positive for rash.  Neurological:  Positive for headaches. Negative for dizziness and weakness.  Psychiatric/Behavioral:  Negative for agitation and behavioral problems.       Objective:    Physical Exam Constitutional:      General: She is not in acute distress.    Appearance: She is obese. She is not diaphoretic.  HENT:     Head: Normocephalic and atraumatic.     Nose: Congestion present.     Mouth/Throat:     Mouth: Mucous membranes are dry.     Pharynx: No posterior oropharyngeal erythema.  Eyes:     General: No scleral icterus.    Extraocular Movements: Extraocular movements intact.  Cardiovascular:     Rate and Rhythm: Normal rate and regular rhythm.     Pulses: Normal pulses.     Heart sounds: Normal heart sounds. No murmur heard. Pulmonary:     Breath sounds: Normal  breath sounds. No wheezing or rales.  Abdominal:     Palpations: Abdomen is soft.     Tenderness: There is no abdominal tenderness.  Musculoskeletal:     Cervical back: Neck supple. No tenderness.     Right lower leg: Edema (Trace) present.     Left lower leg: Edema (Trace) present.  Skin:    General: Skin is dry.     Coloration: Skin is not pale.     Findings: Rash (Self reported urticarial eruptions - not seen today) present.  Neurological:     General: No focal deficit present.     Mental Status: She is alert and oriented to person, place, and time.  Psychiatric:        Mood and Affect: Mood is depressed. Affect is flat.        Behavior: Behavior normal.        Thought Content: Thought content normal.     BP 113/76 (BP Location: Left Arm, Patient Position: Sitting, Cuff Size: Large)   Pulse (!) 101   Ht 5'  10" (1.778 m)   Wt 283 lb 3.2 oz (128.5 kg)   LMP  (LMP Unknown)   SpO2 93%   BMI 40.63 kg/m  Wt Readings from Last 3 Encounters:  09/19/23 283 lb 3.2 oz (128.5 kg)  09/03/23 274 lb (124.3 kg)  08/01/23 279 lb 12.8 oz (126.9 kg)    Lab Results  Component Value Date   TSH 1.520 01/30/2023   Lab Results  Component Value Date   WBC 10.2 01/30/2023   HGB 14.0 01/30/2023   HCT 44.2 01/30/2023   MCV 90 01/30/2023   PLT 349 01/30/2023   Lab Results  Component Value Date   NA 139 08/11/2023   K 4.2 08/11/2023   CO2 25 08/11/2023   GLUCOSE 169 (H) 08/11/2023   BUN 17 08/11/2023   CREATININE 1.07 08/11/2023   BILITOT 0.3 01/30/2023   ALKPHOS 95 01/30/2023   AST 23 01/30/2023   ALT 24 01/30/2023   PROT 6.3 01/30/2023   ALBUMIN 4.3 01/30/2023   CALCIUM 9.4 08/11/2023   ANIONGAP 10 03/15/2022   EGFR 67 01/30/2023   GFR 57.44 (L) 08/11/2023   Lab Results  Component Value Date   CHOL 144 04/29/2023   Lab Results  Component Value Date   HDL 37.50 (L) 04/29/2023   Lab Results  Component Value Date   LDLCALC 77 01/30/2023   Lab Results  Component  Value Date   TRIG 299.0 (H) 04/29/2023   Lab Results  Component Value Date   CHOLHDL 4 04/29/2023   Lab Results  Component Value Date   HGBA1C 7.2 (H) 08/11/2023      Assessment & Plan:   Problem List Items Addressed This Visit       Respiratory   Perennial allergic rhinitis    Her current symptoms are likely due to allergic sinusitis Continue Claritin Added azelastine nasal spray Has been seen by ENT specialist and allergy specialist      Relevant Medications   azelastine (ASTELIN) 0.1 % nasal spray   Other Relevant Orders   Tryptase     Endocrine   Type 2 diabetes mellitus (HCC) - Primary     Lab Results  Component Value Date   HGBA1C 7.2 (H) 08/11/2023   Overall well controlled compared to prior On Jardiance and Bydureon, can discuss with Endocrinology to switch to [facility] On metformin XR 1000 mg QD and Toujeo 170 units in 2 divided doses Followed by endocrinology Advised to follow diabetic diet On ARB and statin F/u CMP and lipid panel Diabetic eye exam: Advised to follow up with Ophthalmology for diabetic eye exam      Relevant Orders   CMP14+EGFR   Diabetic polyneuropathy associated with type 2 diabetes mellitus (HCC)    On Gabapentin 300 mg qHS      Relevant Medications   cyclobenzaprine (FLEXERIL) 5 MG tablet   Other Relevant Orders   CBC with Differential/Platelet     Musculoskeletal and Integument   Urticaria    Hives or skin eruptions likely urticaria Improved with Claritin Advised to keep food diary She had lip swelling on 1 day, likely angioedema-like reaction, but did not have difficulty breathing or swallowing Check CBC, CMP and tryptase level        Other   Major depressive disorder, recurrent (HCC)    On Wellbutrin and Cymbalta Followed by psychiatry - [facility]      Fibromyalgia    On Cymbalta for depression, which can also help with fibromyalgia Has had  PT after rheumatology evaluation On gabapentin 300 mg nightly  for diabetic neuropathy Added Flexeril as needed for muscle spasms      Relevant Medications   cyclobenzaprine (FLEXERIL) 5 MG tablet   Other Visit Diagnoses     Encounter for immunization       Relevant Orders   Flu vaccine trivalent PF, 6mos and older(Flulaval,Afluria,Fluarix,Fluzone) (Completed)       Meds ordered this encounter  Medications   azelastine (ASTELIN) 0.1 % nasal spray    Sig: Place 2 sprays into both nostrils 2 (two) times daily. Use in each nostril as directed    Dispense:  30 mL    Refill:  12   cyclobenzaprine (FLEXERIL) 5 MG tablet    Sig: Take 1 tablet (5 mg total) by mouth 2 (two) times daily as needed.    Dispense:  30 tablet    Refill:  1    Follow-up: Return in about 6 months (around 03/18/2024) for Annual physical.    Anabel Halon, MD

## 2023-09-19 NOTE — Assessment & Plan Note (Addendum)
On Gabapentin 300 mg qHS

## 2023-09-21 LAB — CBC WITH DIFFERENTIAL/PLATELET
Basophils Absolute: 0.1 10*3/uL (ref 0.0–0.2)
Basos: 1 %
EOS (ABSOLUTE): 0.3 10*3/uL (ref 0.0–0.4)
Eos: 3 %
Hematocrit: 42.9 % (ref 34.0–46.6)
Hemoglobin: 13.5 g/dL (ref 11.1–15.9)
Immature Grans (Abs): 0.1 10*3/uL (ref 0.0–0.1)
Immature Granulocytes: 1 %
Lymphocytes Absolute: 2.1 10*3/uL (ref 0.7–3.1)
Lymphs: 20 %
MCH: 26.7 pg (ref 26.6–33.0)
MCHC: 31.5 g/dL (ref 31.5–35.7)
MCV: 85 fL (ref 79–97)
Monocytes Absolute: 0.8 10*3/uL (ref 0.1–0.9)
Monocytes: 8 %
Neutrophils Absolute: 7.4 10*3/uL — ABNORMAL HIGH (ref 1.4–7.0)
Neutrophils: 67 %
Platelets: 380 10*3/uL (ref 150–450)
RBC: 5.06 x10E6/uL (ref 3.77–5.28)
RDW: 14.4 % (ref 11.7–15.4)
WBC: 10.7 10*3/uL (ref 3.4–10.8)

## 2023-09-21 LAB — CMP14+EGFR
ALT: 29 IU/L (ref 0–32)
AST: 28 IU/L (ref 0–40)
Albumin: 4.2 g/dL (ref 3.8–4.9)
Alkaline Phosphatase: 103 IU/L (ref 44–121)
BUN/Creatinine Ratio: 19 (ref 9–23)
BUN: 20 mg/dL (ref 6–24)
Bilirubin Total: 0.3 mg/dL (ref 0.0–1.2)
CO2: 19 mmol/L — ABNORMAL LOW (ref 20–29)
Calcium: 9.4 mg/dL (ref 8.7–10.2)
Chloride: 102 mmol/L (ref 96–106)
Creatinine, Ser: 1.06 mg/dL — ABNORMAL HIGH (ref 0.57–1.00)
Globulin, Total: 2 g/dL (ref 1.5–4.5)
Glucose: 235 mg/dL — ABNORMAL HIGH (ref 70–99)
Potassium: 4.3 mmol/L (ref 3.5–5.2)
Sodium: 142 mmol/L (ref 134–144)
Total Protein: 6.2 g/dL (ref 6.0–8.5)
eGFR: 61 mL/min/{1.73_m2} (ref >59)

## 2023-09-21 LAB — TRYPTASE: Tryptase: 6.4 ug/L (ref 2.2–13.2)

## 2023-09-24 ENCOUNTER — Encounter: Payer: Self-pay | Admitting: "Endocrinology

## 2023-09-25 ENCOUNTER — Other Ambulatory Visit: Payer: Self-pay

## 2023-09-25 DIAGNOSIS — Z794 Long term (current) use of insulin: Secondary | ICD-10-CM

## 2023-09-25 MED ORDER — TRESIBA FLEXTOUCH 200 UNIT/ML ~~LOC~~ SOPN
170.0000 [IU] | PEN_INJECTOR | Freq: Every day | SUBCUTANEOUS | 0 refills | Status: DC
Start: 2023-09-25 — End: 2023-12-15

## 2023-10-01 ENCOUNTER — Encounter: Payer: Self-pay | Admitting: "Endocrinology

## 2023-10-02 ENCOUNTER — Other Ambulatory Visit: Payer: Self-pay

## 2023-10-02 DIAGNOSIS — E1165 Type 2 diabetes mellitus with hyperglycemia: Secondary | ICD-10-CM

## 2023-10-02 MED ORDER — BYDUREON BCISE 2 MG/0.85ML ~~LOC~~ AUIJ
2.0000 mg | AUTO-INJECTOR | SUBCUTANEOUS | 1 refills | Status: DC
Start: 2023-10-02 — End: 2023-12-01

## 2023-10-10 ENCOUNTER — Ambulatory Visit: Payer: Medicare Other | Admitting: Physical Therapy

## 2023-10-13 ENCOUNTER — Encounter: Payer: Self-pay | Admitting: "Endocrinology

## 2023-10-14 ENCOUNTER — Telehealth: Payer: Self-pay | Admitting: Pulmonary Disease

## 2023-10-14 NOTE — Telephone Encounter (Signed)
Inetta Fermo would like order for CPAP machine, CPAP supplies, office notes and sleep studies. Patient using [facility]. Inetta Fermo phone number is [phone]. Fax number is [phone].

## 2023-10-15 ENCOUNTER — Other Ambulatory Visit: Payer: Self-pay

## 2023-10-15 DIAGNOSIS — G4733 Obstructive sleep apnea (adult) (pediatric): Secondary | ICD-10-CM

## 2023-10-17 NOTE — Telephone Encounter (Signed)
Sydney Taylor would like order for CPAP machine and supplies. Patient using [facility]. Sydney Taylor phone number is [phone]. Fax number is [phone].

## 2023-10-19 NOTE — Telephone Encounter (Signed)
Per patient's chart, cpap was ordered on 10/16. Will close this encounter.

## 2023-10-21 ENCOUNTER — Telehealth: Payer: Self-pay | Admitting: Pulmonary Disease

## 2023-10-21 NOTE — Telephone Encounter (Signed)
Tina from [facility] calling for the RX for this PT supplies for CPAP. [phone].  Just order for supplies, no CPAP yet. She has sent a fax. She does have office notes and sleep study results. TY

## 2023-10-21 NOTE — Telephone Encounter (Addendum)
Routed to [facility]. Do you ladies have this request?

## 2023-10-22 NOTE — Telephone Encounter (Signed)
Now PT is calling for this same issues. States we just need to send in an RX. Please call PT to advise @ [phone]

## 2023-10-22 NOTE — Telephone Encounter (Signed)
Referral, notes, sleep study faxed to [facility]

## 2023-10-24 ENCOUNTER — Encounter: Payer: Self-pay | Admitting: Physical Medicine and Rehabilitation

## 2023-10-24 ENCOUNTER — Encounter
Payer: Medicare Other | Attending: Physical Medicine and Rehabilitation | Admitting: Physical Medicine and Rehabilitation

## 2023-10-24 VITALS — BP 129/83 | HR 108 | Ht 70.0 in | Wt 278.4 lb

## 2023-10-24 DIAGNOSIS — R5382 Chronic fatigue, unspecified: Secondary | ICD-10-CM | POA: Diagnosis not present

## 2023-10-24 DIAGNOSIS — M7918 Myalgia, other site: Secondary | ICD-10-CM | POA: Diagnosis not present

## 2023-10-24 DIAGNOSIS — R000 Tachycardia, unspecified: Secondary | ICD-10-CM | POA: Diagnosis not present

## 2023-10-24 MED ORDER — LIDOCAINE HCL 1 % IJ SOLN
3.0000 mL | Freq: Once | INTRAMUSCULAR | Status: AC
Start: 2023-10-24 — End: 2023-10-24
  Administered 2023-10-24: 3 mL

## 2023-10-24 NOTE — Telephone Encounter (Signed)
[facility] calling again. Please see message below. They have everything but need RX only.

## 2023-10-24 NOTE — Progress Notes (Signed)
Pt is a 57 yr old female with hx of fibromyalgia- since 1985, myofascial pain and chronic low back pain who's being followed by Dr Dallas Schimke, ortho for low back- also has B/L knee DJD- BMI 39.89- down to 39.60 currently; also OSA- uses [facility] - is compliant.    Here for f/u of FMS/myofascial pain and trigger point injections.   No major issues A lot of pain in shoulder blades and base of neck.   Nothing specific that set them off-   Hasn't been working out lately- fell off the "wagon" and needs to get back on the horse-  Exercises regularly  for 3 weeks, but stopped.   Hasn't done anything with CBD products-    Plan: Suggest getting back to senior sneakers- at least 3 days/week ideally.     Can try CBD products for sleep? Pain.    3. Patient here for trigger point injections for  Consent done and on chart.  Cleaned areas with alcohol and injected using a 27 gauge 1.5 inch needle  Injected  3cc- none wasted Using 1% Lidocaine with no EPI  Upper traps- less tight Levators Posterior scalenes Middle scalenes Splenius Capitus- B/L  Pectoralis Major- B/L  Rhomboids- B/L x3 Infraspinatus Teres Major/minor Thoracic paraspinals Lumbar paraspinals Other injections- doesn't need hands   Patient's level of pain prior was 6/10 Current level of pain after injections is- slightly better- usually takes 1/2 hour for her  There was no bleeding or complications.  Patient was advised to drink a lot of water on day after injections to flush system Will have increased soreness for 12-48 hours after injections.  Can use Lidocaine patches the day AFTER injections Can use theracane on day of injections in places didn't inject Can use heating pad 4-6 hours AFTER injections   4. F/U in 2 months- fro TRP injections   5. Suggest starting cold therapy with cold shower- some patients  do alternating cold and hot.   6. Con't gabapentin-

## 2023-10-24 NOTE — Patient Instructions (Signed)
Plan: Suggest getting back to senior sneakers- at least 3 days/week ideally.     Can try CBD products for sleep? Pain.    3. Patient here for trigger point injections for  Consent done and on chart.  Cleaned areas with alcohol and injected using a 27 gauge 1.5 inch needle  Injected  3cc- none wasted Using 1% Lidocaine with no EPI  Upper traps- less tight Levators Posterior scalenes Middle scalenes Splenius Capitus- B/L  Pectoralis Major- B/L  Rhomboids- B/L x3 Infraspinatus Teres Major/minor Thoracic paraspinals Lumbar paraspinals Other injections- doesn't need hands   Patient's level of pain prior was 6/10 Current level of pain after injections is- slightly better- usually takes 1/2 hour for her  There was no bleeding or complications.  Patient was advised to drink a lot of water on day after injections to flush system Will have increased soreness for 12-48 hours after injections.  Can use Lidocaine patches the day AFTER injections Can use theracane on day of injections in places didn't inject Can use heating pad 4-6 hours AFTER injections   4. F/U in 2 months- fro TRP injections   5. Suggest starting cold therapy with cold shower- some patients  do alternating cold and hot.

## 2023-10-27 ENCOUNTER — Ambulatory Visit: Payer: Medicare Other | Admitting: Physical Therapy

## 2023-10-27 NOTE — Telephone Encounter (Signed)
If Sydney Taylor is not excepting what I have faxed to them then someone will  need to put in new order for Cpap Machine

## 2023-10-31 NOTE — Telephone Encounter (Signed)
This was sent back to triage without clear instructions on what is needed- do we need to order a cpap machine?

## 2023-10-31 NOTE — Telephone Encounter (Signed)
I spoke with Meg with [employer] and she confirmed that they have all documents from [place] they just needed the CMN signed and returned. They were faxing to the old fax # at [facility]

## 2023-11-12 ENCOUNTER — Encounter: Payer: Self-pay | Admitting: Physical Medicine and Rehabilitation

## 2023-11-14 ENCOUNTER — Encounter: Payer: Medicare Other | Admitting: Physical Medicine and Rehabilitation

## 2023-11-20 MED ORDER — NONFORMULARY OR COMPOUNDED ITEM: 4.0000 mg | Freq: Every day | Status: DC

## 2023-11-20 NOTE — Addendum Note (Signed)
Addended by: Genice Rouge on: 11/20/2023 11:08 AM   Modules accepted: Orders

## 2023-11-24 ENCOUNTER — Ambulatory Visit (INDEPENDENT_AMBULATORY_CARE_PROVIDER_SITE_OTHER): Payer: Medicare Other | Admitting: Family Medicine

## 2023-11-24 ENCOUNTER — Encounter: Payer: Self-pay | Admitting: Family Medicine

## 2023-11-24 ENCOUNTER — Other Ambulatory Visit: Payer: Medicare Other

## 2023-11-24 VITALS — BP 119/82 | HR 94 | Ht 70.0 in | Wt 277.0 lb

## 2023-11-24 DIAGNOSIS — R5382 Chronic fatigue, unspecified: Secondary | ICD-10-CM

## 2023-11-24 DIAGNOSIS — D509 Iron deficiency anemia, unspecified: Secondary | ICD-10-CM | POA: Diagnosis not present

## 2023-11-24 DIAGNOSIS — R42 Dizziness and giddiness: Secondary | ICD-10-CM

## 2023-11-24 DIAGNOSIS — R5383 Other fatigue: Secondary | ICD-10-CM

## 2023-11-24 LAB — EKG 12-LEAD

## 2023-11-24 NOTE — Assessment & Plan Note (Signed)
Fatigue with dizzy spells, most likely due to polypharmacy, insomnia and DM2 dx EKG and Labs ordered to rule out deficiency. Discussed focus on good self-care habits. Get regular, quality sleep by sticking to a consistent schedule. Light exercise like walking or stretching can improve energy levels. Eat a balanced diet, stay hydrated, and avoid too much caffeine or sugar. Take breaks during tasks to pace yourself and prevent burnout.  Follow up with PCP

## 2023-11-24 NOTE — Progress Notes (Signed)
Established Patient Office Visit   Subjective  Patient ID: Sydney Taylor, female    DOB: 1966/09/16  Age: 57 y.o. MRN: [id number]  Chief Complaint  Patient presents with   Acute Visit    Weak, exhausted,  inconsistent spikes in B/P along with dizzy spells and Headaches.     She  has a past medical history of Anemia, Anxiety, Arrhythmia, Bipolar depression (HCC) (12/28/2019), Chronic fatigue, Chronic headaches, Depression, Diabetes mellitus without complication (HCC), Fibromyalgia, GERD (gastroesophageal reflux disease), High cholesterol, HLD (hyperlipidemia), Hypertension, Insect bite, Migraine, Prediabetes, Sleep apnea, Urinary incontinence, and Vasculitis (HCC).  HPI Patient presents to the clinic with complaints of weakness, fatigue, intermittent spikes in blood pressure, dizziness, and recurrent headaches started a few days ago.  Review of Systems  Constitutional:  Positive for malaise/fatigue. Negative for chills and fever.  Neurological:  Positive for dizziness, weakness and headaches.      Objective:     BP 119/82   Pulse 94   Ht 5\' 10"  (1.778 m)   Wt 277 lb (125.6 kg)   LMP  (LMP Unknown)   SpO2 93%   BMI 39.75 kg/m  BP Readings from Last 3 Encounters:  11/24/23 119/82  10/24/23 129/83  09/19/23 113/76      Physical Exam Vitals reviewed.  Constitutional:      General: She is not in acute distress.    Appearance: Normal appearance. She is not ill-appearing, toxic-appearing or diaphoretic.  HENT:     Head: Normocephalic.     Right Ear: Tympanic membrane normal.     Left Ear: Tympanic membrane normal.     Mouth/Throat:     Mouth: Mucous membranes are moist.     Pharynx: Oropharynx is clear.  Eyes:     General:        Right eye: No discharge.        Left eye: No discharge.     Conjunctiva/sclera: Conjunctivae normal.     Pupils: Pupils are equal, round, and reactive to light.  Cardiovascular:     Rate and Rhythm: Normal rate.     Pulses: Normal  pulses.     Heart sounds: Normal heart sounds.  Pulmonary:     Effort: Pulmonary effort is normal. No respiratory distress.     Breath sounds: Normal breath sounds.  Musculoskeletal:     Cervical back: Normal range of motion.  Skin:    General: Skin is warm.     Capillary Refill: Capillary refill takes less than 2 seconds.  Neurological:     Mental Status: She is alert.  Psychiatric:        Mood and Affect: Mood normal.      No results found for any visits on 11/24/23.  The 10-year ASCVD risk score (Arnett DK, et al., 2019) is: 6%    Assessment & Plan:  Iron deficiency anemia, unspecified iron deficiency anemia type -     Vitamin B12 -     Iron, TIBC and Ferritin Panel -     CBC with Differential/Platelet  Other fatigue -     BMP8+eGFR  Dizziness -     EKG 12-Lead  Chronic fatigue Assessment & Plan: Fatigue with dizzy spells, most likely due to polypharmacy, insomnia and DM2 dx EKG and Labs ordered to rule out deficiency. Discussed focus on good self-care habits. Get regular, quality sleep by sticking to a consistent schedule. Light exercise like walking or stretching can improve energy levels. Eat a balanced diet, stay hydrated,  and avoid too much caffeine or sugar. Take breaks during tasks to pace yourself and prevent burnout.  Follow up with PCP     Return if symptoms worsen or fail to improve.   Cruzita Lederer Newman Nip, FNP

## 2023-11-24 NOTE — Patient Instructions (Signed)

## 2023-11-25 ENCOUNTER — Encounter: Payer: Self-pay | Admitting: Family Medicine

## 2023-11-25 LAB — BMP8+EGFR
BUN/Creatinine Ratio: 14 (ref 9–23)
BUN: 15 mg/dL (ref 6–24)
CO2: 23 mmol/L (ref 20–29)
Calcium: 9.8 mg/dL (ref 8.7–10.2)
Chloride: 100 mmol/L (ref 96–106)
Creatinine, Ser: 1.04 mg/dL — ABNORMAL HIGH (ref 0.57–1.00)
Glucose: 98 mg/dL (ref 70–99)
Potassium: 4.5 mmol/L (ref 3.5–5.2)
Sodium: 141 mmol/L (ref 134–144)
eGFR: 62 mL/min/{1.73_m2} (ref >59)

## 2023-11-25 LAB — CBC WITH DIFFERENTIAL/PLATELET
Basophils Absolute: 0.1 10*3/uL (ref 0.0–0.2)
Basos: 1 %
EOS (ABSOLUTE): 0.4 10*3/uL (ref 0.0–0.4)
Eos: 4 %
Hematocrit: 45.3 % (ref 34.0–46.6)
Hemoglobin: 14.1 g/dL (ref 11.1–15.9)
Immature Grans (Abs): 0.1 10*3/uL (ref 0.0–0.1)
Immature Granulocytes: 1 %
Lymphocytes Absolute: 2.3 10*3/uL (ref 0.7–3.1)
Lymphs: 21 %
MCH: 26.2 pg — ABNORMAL LOW (ref 26.6–33.0)
MCHC: 31.1 g/dL — ABNORMAL LOW (ref 31.5–35.7)
MCV: 84 fL (ref 79–97)
Monocytes Absolute: 0.8 10*3/uL (ref 0.1–0.9)
Monocytes: 8 %
Neutrophils Absolute: 7.2 10*3/uL — ABNORMAL HIGH (ref 1.4–7.0)
Neutrophils: 65 %
Platelets: 389 10*3/uL (ref 150–450)
RBC: 5.39 x10E6/uL — ABNORMAL HIGH (ref 3.77–5.28)
RDW: 15.8 % — ABNORMAL HIGH (ref 11.7–15.4)
WBC: 10.9 10*3/uL — ABNORMAL HIGH (ref 3.4–10.8)

## 2023-11-25 LAB — IRON,TIBC AND FERRITIN PANEL
Ferritin: 24 ng/mL (ref 15–150)
Iron Saturation: 9 % — CL (ref 15–55)
Iron: 36 ug/dL (ref 27–159)
Total Iron Binding Capacity: 393 ug/dL (ref 250–450)
UIBC: 357 ug/dL (ref 131–425)

## 2023-11-25 NOTE — Telephone Encounter (Signed)
Please inform patient:   Iron Levels: Since you're vegetarian, focus on plant-based iron sources like spinach, lentils, tofu, and fortified cereals, paired with vitamin C-rich foods to boost absorption. If iron pills caused discomfort, injections are an option can discuss this option with your PCP.  Elevated WBC and Neutrophils: These are slightly elevated and are typically not a cause for concern. It may indicate a mild or recent bacterial infection or inflammation, but without symptoms,  Elevated RBCs with Diverse Morphology: This could reflect minor variations in red blood cells due to factors like mild deficiencies or dehydration. It's likely not a concern

## 2023-11-26 ENCOUNTER — Other Ambulatory Visit: Payer: Self-pay | Admitting: Internal Medicine

## 2023-11-26 ENCOUNTER — Other Ambulatory Visit: Payer: Medicare Other

## 2023-11-26 DIAGNOSIS — E611 Iron deficiency: Secondary | ICD-10-CM | POA: Insufficient documentation

## 2023-11-27 ENCOUNTER — Other Ambulatory Visit: Payer: Self-pay | Admitting: Internal Medicine

## 2023-11-29 ENCOUNTER — Other Ambulatory Visit: Payer: Self-pay | Admitting: "Endocrinology

## 2023-11-29 DIAGNOSIS — E1165 Type 2 diabetes mellitus with hyperglycemia: Secondary | ICD-10-CM

## 2023-12-01 ENCOUNTER — Encounter: Payer: Self-pay | Admitting: "Endocrinology

## 2023-12-01 ENCOUNTER — Telehealth (INDEPENDENT_AMBULATORY_CARE_PROVIDER_SITE_OTHER): Payer: Medicare Other | Admitting: "Endocrinology

## 2023-12-01 DIAGNOSIS — E782 Mixed hyperlipidemia: Secondary | ICD-10-CM

## 2023-12-01 DIAGNOSIS — E1140 Type 2 diabetes mellitus with diabetic neuropathy, unspecified: Secondary | ICD-10-CM

## 2023-12-01 DIAGNOSIS — Z7985 Long-term (current) use of injectable non-insulin antidiabetic drugs: Secondary | ICD-10-CM | POA: Diagnosis not present

## 2023-12-01 DIAGNOSIS — Z7984 Long term (current) use of oral hypoglycemic drugs: Secondary | ICD-10-CM

## 2023-12-01 DIAGNOSIS — Z794 Long term (current) use of insulin: Secondary | ICD-10-CM

## 2023-12-01 MED ORDER — METFORMIN HCL ER 500 MG PO TB24: 1000.0000 mg | ORAL_TABLET | Freq: Every day | ORAL | 1 refills | Status: DC

## 2023-12-01 MED ORDER — TIRZEPATIDE 5 MG/0.5ML ~~LOC~~ SOAJ: 5.0000 mg | SUBCUTANEOUS | 1 refills | Status: DC

## 2023-12-01 NOTE — Progress Notes (Signed)
The patient reports they are currently: [place]. I spent 8-9 minutes on the video with the patient on the date of service. I spent an additional 10 minutes on pre- and post-visit activities on the date of service.   The patient was physically located in [place] or a state in which I am permitted to provide care. The patient and/or parent/guardian understood that s/he may incur co-pays and cost sharing, and agreed to the telemedicine visit. The visit was reasonable and appropriate under the circumstances given the patient's presentation at the time.  The patient and/or parent/guardian has been advised of the potential risks and limitations of this mode of treatment (including, but not limited to, the absence of in-person examination) and has agreed to be treated using telemedicine. The patient's/patient's family's questions regarding telemedicine have been answered.   The patient and/or parent/guardian has also been advised to contact their provider's office for worsening conditions, and seek emergency medical treatment and/or call 911 if the patient deems either necessary.    Outpatient Endocrinology Note Sydney [place], MD  12/01/23   Sydney Taylor 1966/10/03 [id number]  Referring Provider: Anabel Halon, MD Primary Care Provider: Anabel Halon, MD Reason for consultation: Subjective   Assessment & Plan  Diagnoses and all orders for this visit:  Type 2 diabetes mellitus with diabetic neuropathy, with long-term current use of insulin (HCC) -     tirzepatide (MOUNJARO) 5 MG/0.5ML Pen; Inject 5 mg into the skin once a week. -     Hemoglobin A1c -     metFORMIN (GLUCOPHAGE-XR) 500 MG 24 hr tablet; Take 2 tablets (1,000 mg total) by mouth daily with supper.  Long term (current) use of oral hypoglycemic drugs  Long-term (current) use of injectable non-insulin antidiabetic drugs  Long-term insulin use (HCC)  Mixed hyperlipidemia    Diabetes Type II complicated by  neuropathy,  Lab Results  Component Value Date   GFR 57.44 (L) 08/11/2023   Hba1c goal less than 7, current Hba1c is  Lab Results  Component Value Date   HGBA1C 7.2 (H) 08/11/2023   Will recommend the following: Jardiance 25 mg daily, metformin ER 1 g at dinner Tresiba 85 units twice daily     Patient wants to try mounjaro 5 mg once a week to help with weight loss (will hold of Bydureon 2 mg weekly) Disucced S/E, no current contraindication   No known contraindications/side effects to any of above medications Glucagon discussed and prescribed with refills on 12/01/23  -Last LD and Tg are as follows: Lab Results  Component Value Date   LDLCALC 77 01/30/2023    Lab Results  Component Value Date   TRIG 299.0 (H) 04/29/2023   -On rosuvastatin 5 mg every other day due to leg cramps  -Instructed to take rosuvastatin 5 mg every day starting 08/14/23 -Follow low fat diet and exercise   -Blood pressure goal <140/90 - Microalbumin/creatinine goal is < 30 -Last MA/Cr is as follows: Lab Results  Component Value Date   MICROALBUR <0.7 04/29/2023   -on ACE/ARB losartan 25 mg qd -diet changes including salt restriction -limit eating outside -counseled BP targets per standards of diabetes care -uncontrolled blood pressure can lead to retinopathy, nephropathy and cardiovascular and atherosclerotic heart disease  Reviewed and counseled on: -A1C target -Blood sugar targets -Complications of uncontrolled diabetes  -Checking blood sugar before meals and bedtime and bring log next visit -All medications with mechanism of action and side effects -Hypoglycemia management: rule of 15's, Glucagon  Emergency Kit and medical alert ID -low-carb low-fat plate-method diet -At least 20 minutes of physical activity per day -Annual dilated retinal eye exam and foot exam -compliance and follow up needs -follow up as scheduled or earlier if problem gets worse  Call if blood sugar is less than 70  or consistently above 250    Take a 15 gm snack of carbohydrate at bedtime before you go to sleep if your blood sugar is less than 100.    If you are going to fast after midnight for a test or procedure, ask your physician for instructions on how to reduce/decrease your insulin dose.    Call if blood sugar is less than 70 or consistently above 250  -Treating a low sugar by rule of 15  (15 gms of sugar every 15 min until sugar is more than 70) If you feel your sugar is low, test your sugar to be sure If your sugar is low (less than 70), then take 15 grams of a fast acting Carbohydrate (3-4 glucose tablets or glucose gel or 4 ounces of juice or regular soda) Recheck your sugar 15 min after treating low to make sure it is more than 70 If sugar is still less than 70, treat again with 15 grams of carbohydrate          Don't drive the hour of hypoglycemia  If unconscious/unable to eat or drink by mouth, use glucagon injection or nasal spray baqsimi and call 911. Can repeat again in 15 min if still unconscious.  Return in about 4 weeks (around 12/29/2023).   I have reviewed current medications, nurse's notes, allergies, vital signs, past medical and surgical history, family medical history, and social history for this encounter. Counseled patient on symptoms, examination findings, lab findings, imaging results, treatment decisions and monitoring and prognosis. The patient understood the recommendations and agrees with the treatment plan. All questions regarding treatment plan were fully answered.  Sydney [place], MD  12/01/23   History of Present Illness Sydney Taylor who presents for evaluation of Type II diabetes mellitus.  Sydney Taylor.   Diabetes education +  Home diabetes regimen: Jardiance 25 mg daily, metformin ER 1 g at dinner, Bydureon 2 mg weekly Tresiba 85 units bid         Side effects from medications: Nausea  and vomiting with Trulicity  Previous history: She had mild hyperglycemia in 2020 but had significant increase in glucose of 484 in 3/21  Non-insulin hypoglycemic drugs previously used: Jardiance since 9/21, Bydureon, Trulicity, metformin, glipizide Insulin was started in 2022  A1c range in the last few years is: 6.4-12.7  COMPLICATIONS -  MI/Stroke -  retinopathy +  neuropathy +  nephropathy  SYMPTOMS REVIEWED - Polyuria - Weight loss - Blurred vision  BLOOD SUGAR DATA  CGM interpretation: At today's visit, we reviewed her CGM downloads. The full report is scanned in the media. Reviewing the CGM trends, BG are well controlled across the day.   Physical Exam  LMP  (LMP Unknown)    Constitutional: well developed, well nourished Head: normocephalic, atraumatic Eyes: sclera anicteric, no redness Neck: supple Lungs: normal respiratory effort Neurology: alert and oriented Skin: dry, no appreciable rashes Musculoskeletal: no appreciable defects Psychiatric: normal mood and affect Diabetic Foot Exam - Simple   No data filed      Current Medications Patient's Medications  New Prescriptions   TIRZEPATIDE (MOUNJARO) 5  MG/0.5ML PEN    Inject 5 mg into the skin once a week.  Previous Medications   ACETYLCYSTEINE (N-ACETYL CYSTEINE) 600 MG CAPS    Take 1 capsule by mouth daily.   ALPRAZOLAM (XANAX) 0.5 MG TABLET    Take 0.25 mg by mouth daily as needed for anxiety.   AZELASTINE (ASTELIN) 0.1 % NASAL SPRAY    Place 2 sprays into both nostrils 2 (two) times daily. Use in each nostril as directed   B-12 METHYLCOBALAMIN 1000 MCG TBDP    Take 1,000 mcg by mouth daily.   BLOOD GLUCOSE METER KIT AND SUPPLIES KIT    Inject 1 each into the skin daily. Check blood glucose once a day. Diagnosis code: E 11.9.   CALCIUM CARBONATE (TUMS - DOSED IN MG ELEMENTAL CALCIUM) 500 MG CHEWABLE TABLET    Chew 1,000 mg by mouth daily as needed for indigestion or heartburn.   CELECOXIB (CELEBREX) 100  MG CAPSULE    Take 1 capsule (100 mg total) by mouth daily as needed.   CHLORPHEN-PE-ACETAMINOPHEN (NOREL AD) 4-10-325 MG TABS    Take 1 tablet every 4 hours while symptoms persists. Do not take more than 6 tablets in 24 hours.   CHLORTHALIDONE (HYGROTON) 25 MG TABLET    Take 0.5 tablets (12.5 mg total) by mouth daily.   CONTINUOUS BLOOD GLUC SENSOR (DEXCOM G6 SENSOR) MISC    by Does not apply route.   CONTINUOUS BLOOD GLUC TRANSMIT (DEXCOM G6 TRANSMITTER) MISC    by Does not apply route.   CYCLOBENZAPRINE (FLEXERIL) 5 MG TABLET    Take 1 tablet (5 mg total) by mouth 2 (two) times daily as needed.   DICYCLOMINE (BENTYL) 10 MG CAPSULE    TAKE 1 CAPSULE BY MOUTH 4 TIMES DAILY BEFORE MEAL(S) AND  AT BEDTIME FOR ABDOMINAL CRAMPING AND LOOSE STOOL.   DULOXETINE (CYMBALTA) 60 MG CAPSULE    Take 120 mg by mouth every morning. Two capsules (120mg ) daily   EMPAGLIFLOZIN (JARDIANCE) 25 MG TABS TABLET    Take 1 tablet (25 mg total) by mouth daily before breakfast.   GABAPENTIN (NEURONTIN) 300 MG CAPSULE    Take 1 capsule (300 mg total) by mouth at bedtime. X 1 week then can increase to 600 mg QHS if possible- for FMS   GLUCOSE BLOOD (ACCU-CHEK GUIDE) TEST STRIP    1 each by Other route in the morning, at noon, and at bedtime. Use as instructed   INSULIN DEGLUDEC (TRESIBA FLEXTOUCH) 200 UNIT/ML FLEXTOUCH PEN    Inject 170 Units into the skin daily.   INSULIN PEN NEEDLE (PEN NEEDLES) 32G X 4 MM MISC    Check BS 2 x a day   INSULIN PEN NEEDLE (RELION PEN NEEDLES) 31G X 6 MM MISC    USE 1 PEN NEEDLE ONCE A DAY.   L-METHYLFOLATE 15 MG TABS    Take 15 mg by mouth daily at 6 (six) AM.   LOSARTAN (COZAAR) 25 MG TABLET    Take 1 tablet by mouth once daily   METOPROLOL TARTRATE (LOPRESSOR) 25 MG TABLET    Take 1 tablet (25 mg total) by mouth 2 (two) times daily.   MODAFINIL (PROVIGIL) 100 MG TABLET    Take 100 mg by mouth every morning.   MULTIPLE VITAMINS-MINERALS (MULTIVITAMIN WOMEN 50+) TABS    Take 1 tablet by  mouth daily.   NONFORMULARY OR COMPOUNDED ITEM    Take 4 mg by mouth at bedtime. X 2 weeks, then 8  mg nightly- for nerve pain and chronic fatigue- sent in 2 week Rx for 4 mg and 6 months supply for 8 mg- sent to [facility]   OMEGA-3 ACID ETHYL ESTERS (LOVAZA) 1 G CAPSULE    Take 2 capsules (2 g total) by mouth 2 (two) times daily.   PANTOPRAZOLE (PROTONIX) 40 MG TABLET    Take 1 tablet (40 mg total) by mouth daily.   POTASSIUM CHLORIDE SA (KLOR-CON M) 20 MEQ TABLET    Take 2 tablets (40 mEq total) by mouth 2 (two) times daily.   ROSUVASTATIN (CRESTOR) 5 MG TABLET    Take 1 tablet (5 mg total) by mouth daily.   TRESIBA FLEXTOUCH 200 UNIT/ML FLEXTOUCH PEN    INJECT 170 UNITS SUBCUTANEOUSLY ONCE DAILY  Modified Medications   Modified Medication Previous Medication   METFORMIN (GLUCOPHAGE-XR) 500 MG 24 HR TABLET metFORMIN (GLUCOPHAGE-XR) 500 MG 24 hr tablet      Take 2 tablets (1,000 mg total) by mouth daily with supper.    Take 2 tablets (1,000 mg total) by mouth daily with supper.  Discontinued Medications   EXENATIDE ER (BYDUREON BCISE) 2 MG/0.85ML AUIJ    Inject 2 mg into the skin once a week.    Allergies Allergies  Allergen Reactions   Cephalexin Itching, Swelling and Other (See Comments)    Past Medical History Past Medical History:  Diagnosis Date   Anemia    Anxiety    Arrhythmia    Bipolar depression (HCC) 12/28/2019   Chronic fatigue    Chronic headaches    Depression    Diabetes mellitus without complication (HCC)    Fibromyalgia    GERD (gastroesophageal reflux disease)    High cholesterol    HLD (hyperlipidemia)    Hypertension    Insect bite    Migraine    Prediabetes    Sleep apnea    Urinary incontinence    Vasculitis (HCC)     Past Surgical History Past Surgical History:  Procedure Laterality Date   BIOPSY  09/04/2022   Procedure: BIOPSY;  Surgeon: Corbin Ade, MD;  [facility]: [facility];  Service: Endoscopy;;   CHOLECYSTECTOMY N/A  03/15/2022   Procedure: LAPAROSCOPIC CHOLECYSTECTOMY WITH INTRAOPERATIVE CHOLANGIOGRAM;  Surgeon: Lucretia Roers, MD;  Location: [facility];  Service: General;  Laterality: N/A;   CHOLECYSTECTOMY  02/03/2022   COLONOSCOPY  2017   per patient normal and needs next one in 2027 (Dr. Allena Katz)   COLONOSCOPY WITH PROPOFOL N/A 09/04/2022   Procedure: COLONOSCOPY WITH PROPOFOL;  Surgeon: Corbin Ade, MD;  [facility]: [facility];  Service: Endoscopy;  Laterality: N/A;   ESOPHAGOGASTRODUODENOSCOPY N/A 05/14/Taylor   Procedure: ESOPHAGOGASTRODUODENOSCOPY (EGD);  Surgeon: Corbin Ade, MD;  [facility]: [facility];  Service: Endoscopy;  Laterality: N/A;  11:15am   Joint fusion on foot Right    LIVER BIOPSY N/A 03/15/2022   Procedure: LIVER BIOPSY;  Surgeon: Lucretia Roers, MD;  Location: [facility];  Service: General;  Laterality: N/A;   MALONEY DILATION N/A 05/14/Taylor   Procedure: Alvy Beal;  Surgeon: Corbin Ade, MD;  [facility]: [facility];  Service: Endoscopy;  Laterality: N/A;   NASAL SEPTUM SURGERY     POLYPECTOMY  09/04/2022   Procedure: POLYPECTOMY;  Surgeon: Corbin Ade, MD;  Location: [facility];  Service: Endoscopy;;   SINOSCOPY      Family History family history includes Anxiety disorder in her father, maternal aunt, and paternal grandmother; Autoimmune disease  in her brother; Congestive Heart Failure in her mother; Depression in her father, maternal aunt, and paternal grandmother; Diabetes in her father, maternal grandmother, and sister; Diabetes Mellitus II in her mother; Heart attack in her mother; Hyperlipidemia in her father and mother; Hypertension in her brother, father, mother, and sister; Lupus in her paternal grandmother; Stroke in her father.  Social History Social History   Socioeconomic History   Marital status: Married    Spouse name: Not on file   Number of children: 0   Years of education: Not on file   Highest education level: Bachelor's degree  (e.g., BA, AB, BS)  Occupational History   Occupation: research associate/drug development  Tobacco Use   Smoking status: Former    Current packs/day: 0.00    Types: Cigarettes    Quit date: 12/30/1982    Years since quitting: 40.9   Smokeless tobacco: Never  Vaping Use   Vaping status: Never Used  Substance and Sexual Activity   Alcohol use: Not Currently    Alcohol/week: 0.0 standard drinks of alcohol   Drug use: Never   Sexual activity: Yes    Birth control/protection: Post-menopausal  Other Topics Concern   Not on file  Social History Narrative   Married for 3 years.Lives with husband.Bachelors in Building surveyor.   Right handed   Caffeine: 1-2 per day    Social Determinants of Health   Financial Resource Strain: Low Risk  (09/18/2023)   Overall Financial Resource Strain (CARDIA)    Difficulty of Paying Living Expenses: Not very hard  Food Insecurity: No Food Insecurity (09/18/2023)   Hunger Vital Sign    Worried About Running Out of Food in the Last Year: Never true    Ran Out of Food in the Last Year: Never true  Transportation Needs: No Transportation Needs (09/18/2023)   PRAPARE - Administrator, Civil Service (Medical): No    Lack of Transportation (Non-Medical): No  Physical Activity: Insufficiently Active (09/18/2023)   Exercise Vital Sign    Days of Exercise per Week: 1 day    Minutes of Exercise per Session: 30 min  Stress: Stress Concern Present (09/18/2023)   [employer] of Occupational Health - Occupational Stress Questionnaire    Feeling of Stress : To some extent  Social Connections: Unknown (09/18/2023)   Social Connection and Isolation Panel [NHANES]    Frequency of Communication with Friends and Family: Twice a week    Frequency of Social Gatherings with Friends and Family: Patient declined    Attends Religious Services: 1 to 4 times per year    Active Member of Clubs or Organizations: No    Attends Engineer, structural: More than 4  times per year    Marital Status: Married  Catering manager Violence: Not At Risk (06/11/2023)   Humiliation, Afraid, Rape, and Kick questionnaire    Fear of Current or Ex-Partner: No    Emotionally Abused: No    Physically Abused: No    Sexually Abused: No    Lab Results  Component Value Date   HGBA1C 7.2 (H) 08/11/2023   HGBA1C 7.2 (H) 04/29/2023   HGBA1C 6.6 (H) 01/30/2023   Lab Results  Component Value Date   CHOL 144 04/29/2023   Lab Results  Component Value Date   HDL 37.50 (L) 04/29/2023   Lab Results  Component Value Date   LDLCALC 77 01/30/2023   Lab Results  Component Value Date   TRIG 299.0 (H) 04/29/2023  Lab Results  Component Value Date   CHOLHDL 4 04/29/2023   Lab Results  Component Value Date   CREATININE 1.04 (H) 11/24/2023   Lab Results  Component Value Date   GFR 57.44 (L) 08/11/2023   Lab Results  Component Value Date   MICROALBUR <0.7 04/29/2023      Component Value Date/Time   NA 141 11/24/2023 1538   K 4.5 11/24/2023 1538   CL 100 11/24/2023 1538   CO2 23 11/24/2023 1538   GLUCOSE 98 11/24/2023 1538   GLUCOSE 169 (H) 08/11/2023 1447   BUN 15 11/24/2023 1538   CREATININE 1.04 (H) 11/24/2023 1538   CREATININE 1.03 03/29/2022 1324   CALCIUM 9.8 11/24/2023 1538   PROT 6.2 09/19/2023 1049   ALBUMIN 4.2 09/19/2023 1049   AST 28 09/19/2023 1049   ALT 29 09/19/2023 1049   ALKPHOS 103 09/19/2023 1049   BILITOT 0.3 09/19/2023 1049   GFRNONAA 54 (L) 03/15/2022 0731   GFRNONAA 77 03/19/2021 1457   GFRAA 89 03/19/2021 1457      Latest Ref Rng & Units 11/24/2023    3:38 PM 09/19/2023   10:49 AM 08/11/2023    2:47 PM  BMP  Glucose 70 - 99 mg/dL 98  952  841   BUN 6 - 24 mg/dL 15  20  17    Creatinine 0.57 - 1.00 mg/dL 3.24  4.01  0.27   BUN/Creat Ratio 9 - 23 14  19     Sodium 134 - 144 mmol/L 141  142  139   Potassium 3.5 - 5.2 mmol/L 4.5  4.3  4.2   Chloride 96 - 106 mmol/L 100  102  101   CO2 20 - 29 mmol/L 23  19  25     Calcium 8.7 - 10.2 mg/dL 9.8  9.4  9.4        Component Value Date/Time   WBC 10.9 (H) 11/24/2023 1538   WBC 11.1 (H) 03/29/2022 1324   RBC 5.39 (H) 11/24/2023 1538   RBC 4.65 03/29/2022 1324   HGB 14.1 11/24/2023 1538   HCT 45.3 11/24/2023 1538   PLT 389 11/24/2023 1538   MCV 84 11/24/2023 1538   MCH 26.2 (L) 11/24/2023 1538   MCH 29.0 03/29/2022 1324   MCHC 31.1 (L) 11/24/2023 1538   MCHC 32.5 03/29/2022 1324   RDW 15.8 (H) 11/24/2023 1538   LYMPHSABS 2.3 11/24/2023 1538   MONOABS 0.9 06/05/Taylor 1707   EOSABS 0.4 11/24/2023 1538   BASOSABS 0.1 11/24/2023 1538     Parts of this note may have been dictated using voice recognition software. There may be variances in spelling and vocabulary which are unintentional. Not all errors are proofread. Please notify the Thereasa Parkin if any discrepancies are noted or if the meaning of any statement is not clear.

## 2023-12-01 NOTE — Patient Instructions (Signed)

## 2023-12-03 ENCOUNTER — Other Ambulatory Visit: Payer: Medicare Other

## 2023-12-03 ENCOUNTER — Encounter: Payer: Medicare Other | Admitting: Hematology

## 2023-12-07 ENCOUNTER — Encounter: Payer: Self-pay | Admitting: "Endocrinology

## 2023-12-08 ENCOUNTER — Other Ambulatory Visit: Payer: Self-pay | Admitting: "Endocrinology

## 2023-12-08 ENCOUNTER — Other Ambulatory Visit: Payer: Self-pay

## 2023-12-08 MED ORDER — BYDUREON BCISE 2 MG/0.85ML ~~LOC~~ AUIJ: 2.0000 mg | AUTO-INJECTOR | SUBCUTANEOUS | 0 refills | Status: DC

## 2023-12-14 NOTE — Therapy (Signed)
OUTPATIENT PHYSICAL THERAPY FEMALE PELVIC EVALUATION   Patient Name: Sydney Taylor MRN: [id number] DOB:April 10, 1966, 57 y.o., female Today's Date: 12/15/2023  END OF SESSION:  PT End of Session - 12/15/23 1504     Visit Number 1    Date for PT Re-Evaluation 02/09/24    Authorization Type [employer] [id number]  AUTH REQUIRED    PT Start Time 1400    PT Stop Time 1500    PT Time Calculation (min) 60 min    Activity Tolerance Patient tolerated treatment well    Behavior During Therapy Anxious             Past Medical History:  Diagnosis Date   Anemia    Anxiety    Arrhythmia    Bipolar depression (HCC) 12/28/2019   Chronic fatigue    Chronic headaches    Depression    Diabetes mellitus without complication (HCC)    Fibromyalgia    GERD (gastroesophageal reflux disease)    High cholesterol    HLD (hyperlipidemia)    Hypertension    Insect bite    Migraine    Prediabetes    Sleep apnea    Urinary incontinence    Vasculitis (HCC)    Past Surgical History:  Procedure Laterality Date   BIOPSY  09/04/2022   Procedure: BIOPSY;  Surgeon: Corbin Ade, MD;  Location: [facility];  Service: Endoscopy;;   CHOLECYSTECTOMY N/A 03/15/2022   Procedure: LAPAROSCOPIC CHOLECYSTECTOMY WITH INTRAOPERATIVE CHOLANGIOGRAM;  Surgeon: Lucretia Roers, MD;  Location: [facility];  Service: General;  Laterality: N/A;   CHOLECYSTECTOMY  02/03/2022   COLONOSCOPY  2017   per patient normal and needs next one in 2027 (Dr. Allena Katz)   COLONOSCOPY WITH PROPOFOL N/A 09/04/2022   Procedure: COLONOSCOPY WITH PROPOFOL;  Surgeon: Corbin Ade, MD;  Location: [facility];  Service: Endoscopy;  Laterality: N/A;   ESOPHAGOGASTRODUODENOSCOPY N/A 05/12/2020   Procedure: ESOPHAGOGASTRODUODENOSCOPY (EGD);  Surgeon: Corbin Ade, MD;  Location: [facility];  Service: Endoscopy;  Laterality: N/A;  11:15am   Joint fusion on foot Right    LIVER BIOPSY N/A 03/15/2022   Procedure: LIVER BIOPSY;  Surgeon:  Lucretia Roers, MD;  Location: [facility];  Service: General;  Laterality: N/A;   MALONEY DILATION N/A 05/12/2020   Procedure: Alvy Beal;  Surgeon: Corbin Ade, MD;  Location: [facility];  Service: Endoscopy;  Laterality: N/A;   NASAL SEPTUM SURGERY     POLYPECTOMY  09/04/2022   Procedure: POLYPECTOMY;  Surgeon: Corbin Ade, MD;  Location: [facility];  Service: Endoscopy;;   SINOSCOPY     Patient Active Problem List   Diagnosis Date Noted   Iron deficiency 11/26/2023   Hypersomnolence 05/28/2023   Recurrent sinusitis 03/17/2023   Abnormal EKG 02/10/2023   Epigastric pain determined by examination 02/10/2023   Acute right-sided thoracic back pain 01/30/2023   Myofascial pain dysfunction syndrome 01/27/2023   Chronic fatigue 10/07/2022   Cutaneous vasculitis 09/23/2022   Diarrhea 07/30/2022   Rash and nonspecific skin eruption 06/07/2022   Essential hypertension 06/03/2022   Fatty liver 03/05/2022   Encounter for general adult medical examination with abnormal findings 02/01/2022   Plantar fasciitis 02/01/2022   Chest pain 01/14/2022   Chronic migraine with aura 10/29/2021   Diabetic polyneuropathy associated with type 2 diabetes mellitus (HCC) 09/19/2021   History of hormone therapy 09/19/2021   Bilateral primary osteoarthritis of knee 04/10/2021   Perennial allergic rhinitis 04/06/2021  Allergic contact dermatitis 04/06/2021   Urticaria 04/06/2021   Fibromyalgia 01/12/2021   Elevated C-reactive protein (CRP) 01/12/2021   Diverticulosis of colon without hemorrhage 01/02/2021   Major depressive disorder, recurrent (HCC) 07/14/2020   GERD (gastroesophageal reflux disease) 05/11/2020   Type 2 diabetes mellitus (HCC) 03/27/2020   Bipolar depression (HCC) 12/28/2019   OSA on CPAP 12/28/2019   Tachycardia 10/31/2019   Hyperlipidemia 10/30/2015   Morbid obesity (HCC) 10/30/2015    PCP: Anabel Halon, MD PCP - General  REFERRING PROVIDER: Luella Cook, MD Ref Provider  REFERRING DIAG: R10.2 (ICD-10-CM) - Pelvic and perineal pain   THERAPY DIAG:  Other muscle spasm  Other lack of coordination  Rationale for Evaluation and Treatment: Rehabilitation  ONSET DATE: 2012  SUBJECTIVE:                                                                                                                                                                                           SUBJECTIVE STATEMENT: Pt reports that she has pain with intercourse, with different angles that are not straight on. Pain with intercourse about 10 years or longer. UI for about 10 years, worsened overtime.   Fluid intake: Yes: to be asked    PAIN:  Are you having pain? Yes fibromyalgia, fatigue NPRS scale: 6/10 Pain location: Internal and Deep with intercourse  Pain type: soreness Pain description:  soreness    Aggravating factors: intercourse Relieving factors: stopping intercourse  PRECAUTIONS: None  RED FLAGS: None   WEIGHT BEARING RESTRICTIONS: No  FALLS:  Has patient fallen in last 6 months? No  LIVING ENVIRONMENT: Lives with: lives with their spouse Lives in: House/apartment Stairs: No Has following equipment at home: None  OCCUPATION: retired, does housework, feels Programme researcher, broadcasting/film/video, errands  PLOF: Independent  PATIENT GOALS: would like to have intercourse without pain, minimize incontinence  PERTINENT HISTORY:  Gallbladder removal 2023 Mid 20's surgery on vaginal septum (Dr said that there was scar tissue per pt) Right foot fusion  Sexual abuse: No  BOWEL MOVEMENT:no issues Pain with bowel movement: No   URINATION: Pain with urination: No Fully empty bladder: Yes:   Stream:  doesn't know Urgency: Yes:   Frequency: no Leakage: Urge to void and Walking to the bathroom Pads: Yes: 1 Takes diuretic and another drug that makes you pee out sugar  INTERCOURSE: Pain with intercourse:  most pain with thrusting, different angles-  anything other than missionary position is more painful Ability to have vaginal penetration:  Yes:   Climax: not vaginally, otherwise Marinoff Scale: 1/3  PREGNANCY: Vaginal deliveries - no children  PROLAPSE: None   OBJECTIVE:  Note: Objective measures were completed at Evaluation unless otherwise noted.   PATIENT SURVEYS:    PFIQ-7 90  COGNITION: Overall cognitive status: Within functional limits for tasks assessed     SENSATION: Light touch: Appears intact Proprioception: Appears intact  MUSCLE LENGTH: Hamstrings: Right 80 deg; Left 80 deg LUMBAR SPECIAL TESTS:  Straight leg raise test: Negative   Gait- antalgic   POSTURE: rounded shoulders and forward head  PELVIC ALIGNMENT:seems even  LUMBARAROM/PROM: slightly limited throughout   LOWER EXTREMITY ROM: grossly within functional limitations    LOWER EXTREMITY MMT: at least 4/5 grossly overall  PALPATION:   General  within functional limitations                 External Perineal Exam within functional limitations                              Internal Pelvic Floor tight and tender throughout  Patient confirms identification and approves PT to assess internal pelvic floor and treatment Yes  PELVIC MMT:   MMT eval  Vaginal 3/5  Internal Anal Sphincter   External Anal Sphincter   Puborectalis   Diastasis Recti no  (Blank rows = not tested)        TONE: Low abdominal tone, high tone in pelvic floor throughout and glutes  PROLAPSE: None in hooklying  TODAY'S TREATMENT:                                                                                                                              DATE: 12/15/2023  EVAL see below   PATIENT EDUCATION:  Education details: lubricants, Ohnut, relevant anatomy, superficial pelvic floor massage, dilators for pelvic floor release Person educated: Patient Education method: Explanation, Demonstration, Tactile cues, Verbal cues, and Handouts Education  comprehension: verbalized understanding and needs further education  HOME EXERCISE PROGRAM: [id number]  ASSESSMENT:  CLINICAL IMPRESSION: Patient is a 57 y.o. F who was seen today for physical therapy evaluation and treatment for dyspareunia and urge incontinence. She presents with tight and tender pelvic floor throughout all layers-especially layer 1-vaginal tissue dryness, abdominal gripping, upper chest breathing and low abdominal tone. Pt was educated on manual perineal / superficial pelvic floor massage with a lubricant, dilators and ohnuts to reduce depth of penetration. Pt to order Intimate Rose dilator size 6 and 7. Pt will benefit from PT to reduce pain and urge incontinence and improve quality of life.   OBJECTIVE IMPAIRMENTS: Abnormal gait, decreased coordination, decreased knowledge of condition, decreased ROM, decreased strength, increased muscle spasms, obesity, and pain.   ACTIVITY LIMITATIONS: continence and toileting  PARTICIPATION LIMITATIONS: interpersonal relationship  PERSONAL FACTORS: Age and Time since onset of injury/illness/exacerbation are also affecting patient's functional outcome.   REHAB POTENTIAL: Good  CLINICAL DECISION MAKING: Stable/uncomplicated  EVALUATION COMPLEXITY: Low   GOALS: Goals reviewed with patient? Yes  SHORT TERM GOALS: Target date: 01/12/2024  Pt will be I with her HEP and dem exercises correctly and consistently Baseline: Goal status: INITIAL  2.  Pt will be I with urge drill Baseline:  Goal status: INITIAL  3.  Pt will be I with perineal massage Baseline:  Goal status: INITIAL  4.  Pt will be I with dilator use Baseline:  Goal status: INITIAL   LONG TERM GOALS: Target date: 02/09/2024    Pt will report max 2/10 pain with intercourse Baseline:  Goal status: INITIAL  2.  Pt will soak 0 pads/day Baseline:  Goal status: INITIAL  3.  Pt will be I with advanced HEP Baseline:  Goal status: INITIAL   PLAN:  PT  FREQUENCY: 1-2x/week  PT DURATION: 8 weeks  PLANNED INTERVENTIONS: 97110-Therapeutic exercises, 97530- Therapeutic activity, 97112- Neuromuscular re-education, 97535- Self Care, [id number]- Manual therapy, [phone]- Electrical stimulation (manual), Dry Needling, Joint mobilization, Joint manipulation, Spinal manipulation, Spinal mobilization, DME instructions, Moist heat, and Biofeedback  PLAN FOR NEXT SESSION: stretches, urge drill    Ulysess Witz, PT 12/15/23 3:06 PM

## 2023-12-15 ENCOUNTER — Ambulatory Visit: Payer: Medicare Other | Admitting: Physical Therapy

## 2023-12-15 ENCOUNTER — Other Ambulatory Visit: Payer: Self-pay

## 2023-12-15 ENCOUNTER — Encounter: Payer: Self-pay | Admitting: Physical Therapy

## 2023-12-15 DIAGNOSIS — M62838 Other muscle spasm: Secondary | ICD-10-CM | POA: Insufficient documentation

## 2023-12-15 DIAGNOSIS — R278 Other lack of coordination: Secondary | ICD-10-CM | POA: Insufficient documentation

## 2023-12-15 DIAGNOSIS — Z794 Long term (current) use of insulin: Secondary | ICD-10-CM

## 2023-12-15 MED ORDER — TRESIBA FLEXTOUCH 200 UNIT/ML ~~LOC~~ SOPN: 170.0000 [IU] | PEN_INJECTOR | Freq: Every day | SUBCUTANEOUS | 0 refills | Status: DC

## 2023-12-16 ENCOUNTER — Inpatient Hospital Stay: Payer: Medicare Other

## 2023-12-16 ENCOUNTER — Inpatient Hospital Stay: Payer: Medicare Other | Attending: Hematology | Admitting: Hematology

## 2023-12-16 ENCOUNTER — Other Ambulatory Visit: Payer: Self-pay

## 2023-12-16 ENCOUNTER — Encounter: Payer: Self-pay | Admitting: Hematology

## 2023-12-16 VITALS — BP 141/76 | HR 101 | Temp 99.4°F | Resp 19 | Ht 70.0 in | Wt 277.4 lb

## 2023-12-16 DIAGNOSIS — E611 Iron deficiency: Secondary | ICD-10-CM | POA: Diagnosis present

## 2023-12-16 DIAGNOSIS — R5383 Other fatigue: Secondary | ICD-10-CM | POA: Diagnosis not present

## 2023-12-16 DIAGNOSIS — Z87891 Personal history of nicotine dependence: Secondary | ICD-10-CM | POA: Insufficient documentation

## 2023-12-16 DIAGNOSIS — Z803 Family history of malignant neoplasm of breast: Secondary | ICD-10-CM | POA: Diagnosis not present

## 2023-12-16 NOTE — Progress Notes (Signed)
[facility] [address] [place] [address]   Clinic Day:  12/16/2023  Referring physician: Anabel Halon, MD  Patient Care Team: Anabel Halon, MD as PCP - General (Internal Medicine) Pricilla Riffle, MD as PCP - Cardiology (Cardiology) Jena Gauss Gerrit Friends, MD as Consulting Physician (Gastroenterology) Gavin Pound, [facility] (Inactive) (Pharmacist)   ASSESSMENT & PLAN:   Assessment:  1.  Iron deficiency state: - She was seen at the request of Dr. Allena Katz. - Recent labs on 11/24/2023 showed ferritin low at 24 and percent saturation 9.  Hemoglobin normal at 14.1. - She reports extreme fatigue.  She cannot tolerate oral iron due to stomach pain.  She denies any BRBPR/melena.  No prior history of transfusion or parenteral iron therapy.  No B symptoms. - Colonoscopy was on 09/04/2022.  2. Social/Family History: -Retired. No tobacco use. Independent of ADL's and IADL's.  -No family history of anemia or thalassemia. Maternal aunt had breast cancer. No other family history of cancer.  Plan:  1.  Iron deficiency state: - We discussed the lab results from 11/24/2023. - Because of her low iron levels and extreme fatigue, and intolerance to oral iron, recommend parenteral iron therapy with INFeD 1 g IV x 1. - We discussed side effects including rare chance of anaphylactic reaction. - Recommend follow-up in 6 months with repeat CBC, ferritin and iron panel.   Orders Placed This Encounter  Procedures   CBC with Differential    Standing Status:   Future    Expected Date:   06/15/2024    Expiration Date:   12/15/2024   Ferritin    Standing Status:   Future    Expected Date:   06/15/2024    Expiration Date:   12/15/2024   Iron and TIBC ([facility] ONLY)    Standing Status:   Future    Expected Date:   06/15/2024    Expiration Date:   12/15/2024      Sydney Taylor,acting as a scribe for Doreatha Massed, MD.,have documented all  relevant documentation on the behalf of Doreatha Massed, MD,as directed by  Doreatha Massed, MD while in the presence of Doreatha Massed, MD.   I, Doreatha Massed MD, have reviewed the above documentation for accuracy and completeness, and I agree with the above.   Doreatha Massed, MD   12/17/20245:43 PM  CHIEF COMPLAINT/PURPOSE OF CONSULT:   Diagnosis: Iron deficiency state  Current Therapy: INFeD  HISTORY OF PRESENT ILLNESS:   Sydney Taylor is a 57 y.o. female presenting to clinic today for evaluation of iron deficiency at the request of Anabel Halon, MD.  Patient was seen by her PCP on 11/24/23 and stated she had been feeling fatigued. Iron saturation levels from 11/24/23 were low at 9% and ferritin was at 24. Her CBC from that day found elevated WBC at 10.9, elevated RBC at 5.39, normal HGB at 14.1, normal MCV at 84, low MCH at 26.2, low MCHC at 31.1, and elevated RDW at 15.8. Creatinine levels were  slightly elevated at 1.04 from 11/24/23. She would like to know if she could receive IV iron due to intolerance of oral iron.   Today, she states that she is doing well overall. Her appetite level is at 100%. Her energy level is at 0%.  She denies any BRBPR, melena, ice pica, or other unusual cravings. She has no history of blood transfusions or been given IV iron. She has a history of anemia during  menstruation and non-alcoholic fatty liver. She denies any fevers, unexpected weight loss, or night sweats. Her last colonoscopy was in September 2023.  PAST MEDICAL HISTORY:   Past Medical History: Past Medical History:  Diagnosis Date   Anemia    Anxiety    Arrhythmia    Bipolar depression (HCC) 12/28/2019   Chronic fatigue    Chronic headaches    Depression    Diabetes mellitus without complication (HCC)    Fibromyalgia    GERD (gastroesophageal reflux disease)    High cholesterol    HLD (hyperlipidemia)    Hypertension    Insect bite    Migraine     Prediabetes    Sleep apnea    Urinary incontinence    Vasculitis (HCC)     Surgical History: Past Surgical History:  Procedure Laterality Date   BIOPSY  09/04/2022   Procedure: BIOPSY;  Surgeon: Corbin Ade, MD;  [facility];  Service: Endoscopy;;   CHOLECYSTECTOMY N/A 03/15/2022   Procedure: LAPAROSCOPIC CHOLECYSTECTOMY WITH INTRAOPERATIVE CHOLANGIOGRAM;  Surgeon: Lucretia Roers, MD;  Location: [facility];  Service: General;  Laterality: N/A;   CHOLECYSTECTOMY  02/03/2022   COLONOSCOPY  2017   per patient normal and needs next one in 2027 (Dr. Allena Katz)   COLONOSCOPY WITH PROPOFOL N/A 09/04/2022   Procedure: COLONOSCOPY WITH PROPOFOL;  Surgeon: Corbin Ade, MD;  [facility];  Service: Endoscopy;  Laterality: N/A;   ESOPHAGOGASTRODUODENOSCOPY N/A 05/12/2020   Procedure: ESOPHAGOGASTRODUODENOSCOPY (EGD);  Surgeon: Corbin Ade, MD;  [facility];  Service: Endoscopy;  Laterality: N/A;  11:15am   Joint fusion on foot Right    LIVER BIOPSY N/A 03/15/2022   Procedure: LIVER BIOPSY;  Surgeon: Lucretia Roers, MD;  Location: [facility];  Service: General;  Laterality: N/A;   MALONEY DILATION N/A 05/12/2020   Procedure: Alvy Beal;  Surgeon: Corbin Ade, MD;  [facility];  Service: Endoscopy;  Laterality: N/A;   NASAL SEPTUM SURGERY     POLYPECTOMY  09/04/2022   Procedure: POLYPECTOMY;  Surgeon: Corbin Ade, MD;  [facility];  Service: Endoscopy;;   SINOSCOPY      Social History: Social History   Socioeconomic History   Marital status: Married    Spouse name: Not on file   Number of children: 0   Years of education: Not on file   Highest education level: Bachelor's degree (e.g., BA, AB, BS)  Occupational History   Occupation: research associate/drug development  Tobacco Use   Smoking status: Former    Current packs/day: 0.00    Types: Cigarettes    Quit date: 12/30/1982    Years since quitting: 40.9    Smokeless tobacco: Never  Vaping Use   Vaping status: Never Used  Substance and Sexual Activity   Alcohol use: Not Currently    Alcohol/week: 0.0 standard drinks of alcohol   Drug use: Never   Sexual activity: Yes    Birth control/protection: Post-menopausal  Other Topics Concern   Not on file  Social History Narrative   Married for 3 years.Lives with husband.Bachelors in Building surveyor.   Right handed   Caffeine: 1-2 per day    Social Drivers of Health   Financial Resource Strain: Low Risk  (09/18/2023)   Overall Financial Resource Strain (CARDIA)    Difficulty of Paying Living Expenses: Not very hard  Food Insecurity: No Food Insecurity (12/16/2023)   Hunger Vital Sign    Worried About  Running Out of Food in the Last Year: Never true    Ran Out of Food in the Last Year: Never true  Transportation Needs: No Transportation Needs (12/16/2023)   PRAPARE - Administrator, Civil Service (Medical): No    Lack of Transportation (Non-Medical): No  Physical Activity: Insufficiently Active (09/18/2023)   Exercise Vital Sign    Days of Exercise per Week: 1 day    Minutes of Exercise per Session: 30 min  Stress: Stress Concern Present (09/18/2023)   [employer] of Occupational Health - Occupational Stress Questionnaire    Feeling of Stress : To some extent  Social Connections: Unknown (09/18/2023)   Social Connection and Isolation Panel [NHANES]    Frequency of Communication with Friends and Family: Twice a week    Frequency of Social Gatherings with Friends and Family: Patient declined    Attends Religious Services: 1 to 4 times per year    Active Member of [employer] or Organizations: No    Attends Engineer, structural: More than 4 times per year    Marital Status: Married  Catering manager Violence: Not At Risk (12/16/2023)   Humiliation, Afraid, Rape, and Kick questionnaire    Fear of Current or Ex-Partner: No    Emotionally Abused: No    Physically Abused: No     Sexually Abused: No    Family History: Family History  Problem Relation Age of Onset   Hypertension Father    Hyperlipidemia Father    Diabetes Father    Stroke Father    Depression Father    Anxiety disorder Father    Hypertension Mother    Hyperlipidemia Mother    Diabetes Mellitus II Mother    Congestive Heart Failure Mother    Heart attack Mother    Hypertension Sister    Diabetes Sister    Hypertension Brother    Autoimmune disease Brother    Diabetes Maternal Grandmother    Lupus Paternal Grandmother    Depression Paternal Grandmother    Anxiety disorder Paternal Grandmother    Depression Maternal Aunt    Anxiety disorder Maternal Aunt    Colon cancer Neg Hx    Esophageal cancer Neg Hx    Gastric cancer Neg Hx     Current Medications:  Current Outpatient Medications:    Acetylcysteine (N-ACETYL CYSTEINE) 600 MG CAPS, Take 1 capsule by mouth daily., Disp: , Rfl:    ALPRAZolam (XANAX) 0.5 MG tablet, Take 0.25 mg by mouth daily as needed for anxiety., Disp: , Rfl:    B-12 Methylcobalamin 1000 MCG TBDP, Take 1,000 mcg by mouth daily., Disp: , Rfl:    blood glucose meter kit and supplies KIT, Inject 1 each into the skin daily. Check blood glucose once a day. Diagnosis code: E 11.9., Disp: 1 each, Rfl: 0   calcium carbonate (TUMS - DOSED IN MG ELEMENTAL CALCIUM) 500 MG chewable tablet, Chew 1,000 mg by mouth daily as needed for indigestion or heartburn., Disp: , Rfl:    chlorthalidone (HYGROTON) 25 MG tablet, Take 0.5 tablets (12.5 mg total) by mouth daily., Disp: 45 tablet, Rfl: 1   Continuous Glucose Receiver (DEXCOM G7 RECEIVER) DEVI, 1 each by Does not apply route continuous., Disp: , Rfl:    cyclobenzaprine (FLEXERIL) 5 MG tablet, Take 1 tablet (5 mg total) by mouth 2 (two) times daily as needed., Disp: 30 tablet, Rfl: 1   DULoxetine (CYMBALTA) 60 MG capsule, Take 120 mg by mouth every morning. Two capsules (  120mg ) daily, Disp: , Rfl:    empagliflozin (JARDIANCE) 25  MG TABS tablet, Take 1 tablet (25 mg total) by mouth daily before breakfast., Disp: 30 tablet, Rfl: 3   Exenatide ER (BYDUREON BCISE) 2 MG/0.85ML AUIJ, Inject 2 mg into the skin once a week., Disp: 3.4 mL, Rfl: 0   gabapentin (NEURONTIN) 300 MG capsule, Take 1 capsule (300 mg total) by mouth at bedtime. X 1 week then can increase to 600 mg QHS if possible- for FMS, Disp: 60 capsule, Rfl: 5   glucose blood (ACCU-CHEK GUIDE) test strip, 1 each by Other route in the morning, at noon, and at bedtime. Use as instructed, Disp: 300 each, Rfl: 3   insulin degludec (TRESIBA FLEXTOUCH) 200 UNIT/ML FlexTouch Pen, Inject 170 Units into the skin daily., Disp: 81 mL, Rfl: 0   Insulin Pen Needle (PEN NEEDLES) 32G X 4 MM MISC, Check BS 2 x a day, Disp: 200 each, Rfl: 5   Insulin Pen Needle (RELION PEN NEEDLES) 31G X 6 MM MISC, USE 1 PEN NEEDLE ONCE A DAY., Disp: 50 each, Rfl: 5   L-Methylfolate 15 MG TABS, Take 15 mg by mouth daily at 6 (six) AM., Disp: , Rfl:    losartan (COZAAR) 25 MG tablet, Take 1 tablet by mouth once daily, Disp: 90 tablet, Rfl: 0   metoprolol tartrate (LOPRESSOR) 25 MG tablet, Take 1 tablet by mouth twice daily, Disp: 180 tablet, Rfl: 2   modafinil (PROVIGIL) 100 MG tablet, Take 100 mg by mouth every morning., Disp: , Rfl:    Multiple Vitamins-Minerals (MULTIVITAMIN WOMEN 50+) TABS, Take 1 tablet by mouth daily., Disp: , Rfl:    NONFORMULARY OR COMPOUNDED ITEM, Take 4 mg by mouth at bedtime. X 2 weeks, then 8 mg nightly- for nerve pain and chronic fatigue- sent in 2 week Rx for 4 mg and 6 months supply for 8 mg- sent to [facility] (Patient taking differently: Take 8 mg by mouth at bedtime. Lotroxone 8 mg), Disp: , Rfl:    omega-3 acid ethyl esters (LOVAZA) 1 g capsule, Take 2 capsules (2 g total) by mouth 2 (two) times daily., Disp: 120 capsule, Rfl: 5   potassium chloride SA (KLOR-CON M) 20 MEQ tablet, Take 2 tablets (40 mEq total) by mouth 2 (two) times daily. (Patient taking  differently: Take 40 mEq by mouth daily.), Disp: 720 tablet, Rfl: 0   rosuvastatin (CRESTOR) 5 MG tablet, Take 1 tablet (5 mg total) by mouth daily., Disp: 90 tablet, Rfl: 1   tirzepatide (MOUNJARO) 5 MG/0.5ML Pen, Inject 5 mg into the skin once a week., Disp: 2 mL, Rfl: 1   TRESIBA FLEXTOUCH 200 UNIT/ML FlexTouch Pen, INJECT 170 UNITS SUBCUTANEOUSLY ONCE DAILY, Disp: 18 mL, Rfl: 0   metFORMIN (GLUCOPHAGE-XR) 500 MG 24 hr tablet, Take 2 tablets (1,000 mg total) by mouth daily with supper., Disp: 180 tablet, Rfl: 1   pantoprazole (PROTONIX) 40 MG tablet, Take 1 tablet (40 mg total) by mouth daily., Disp: 90 tablet, Rfl: 1   Allergies: Allergies  Allergen Reactions   Cephalexin Itching, Swelling and Other (See Comments)    REVIEW OF SYSTEMS:   Review of Systems  Constitutional:  Negative for chills, fatigue and fever.  HENT:   Negative for lump/mass, mouth sores, nosebleeds, sore throat and trouble swallowing.   Eyes:  Negative for eye problems.  Respiratory:  Negative for cough and shortness of breath.   Cardiovascular:  Negative for chest pain, leg swelling and palpitations.  Gastrointestinal:  Negative for abdominal pain, constipation, diarrhea, nausea and vomiting.  Genitourinary:  Negative for bladder incontinence, difficulty urinating, dysuria, frequency, hematuria and nocturia.   Musculoskeletal:  Negative for arthralgias, back pain, flank pain, myalgias and neck pain.       +fibromyalgia, 5/10 severity  Skin:  Negative for itching and rash.  Neurological:  Positive for dizziness, headaches and numbness (in feet).  Hematological:  Does not bruise/bleed easily.  Psychiatric/Behavioral:  Negative for depression, sleep disturbance and suicidal ideas. The patient is not nervous/anxious.   All other systems reviewed and are negative.    VITALS:   Blood pressure (!) 141/76, pulse (!) 101, temperature 99.4 F (37.4 C), temperature source Tympanic, resp. rate 19, height 5\' 10"  (1.778  m), weight 277 lb 6.4 oz (125.8 kg), SpO2 97%.  Wt Readings from Last 3 Encounters:  12/16/23 277 lb 6.4 oz (125.8 kg)  11/24/23 277 lb (125.6 kg)  10/24/23 278 lb 6.4 oz (126.3 kg)    Body mass index is 39.8 kg/m.   PHYSICAL EXAM:   Physical Exam Vitals and nursing note reviewed. Exam conducted with a chaperone present.  Constitutional:      Appearance: Normal appearance.  Cardiovascular:     Rate and Rhythm: Normal rate and regular rhythm.     Pulses: Normal pulses.     Heart sounds: Normal heart sounds.  Pulmonary:     Effort: Pulmonary effort is normal.     Breath sounds: Normal breath sounds.  Abdominal:     Palpations: Abdomen is soft. There is no hepatomegaly, splenomegaly or mass.     Tenderness: There is no abdominal tenderness.  Musculoskeletal:     Right lower leg: No edema.     Left lower leg: No edema.  Lymphadenopathy:     Cervical: No cervical adenopathy.     Right cervical: No superficial, deep or posterior cervical adenopathy.    Left cervical: No superficial, deep or posterior cervical adenopathy.     Upper Body:     Right upper body: No supraclavicular or axillary adenopathy.     Left upper body: No supraclavicular or axillary adenopathy.  Neurological:     General: No focal deficit present.     Mental Status: She is alert and oriented to person, place, and time.  Psychiatric:        Mood and Affect: Mood normal.        Behavior: Behavior normal.     LABS:      Latest Ref Rng & Units 11/24/2023    3:38 PM 09/19/2023   10:49 AM 01/30/2023    2:21 PM  CBC  WBC 3.4 - 10.8 x10E3/uL 10.9  10.7  10.2   Hemoglobin 11.1 - 15.9 g/dL 10.9  32.3  55.7   Hematocrit 34.0 - 46.6 % 45.3  42.9  44.2   Platelets 150 - 450 x10E3/uL 389  380  349       Latest Ref Rng & Units 11/24/2023    3:38 PM 09/19/2023   10:49 AM 08/11/2023    2:47 PM  CMP  Glucose 70 - 99 mg/dL 98  322  025   BUN 6 - 24 mg/dL 15  20  17    Creatinine 0.57 - 1.00 mg/dL 4.27  0.62  3.76    Sodium 134 - 144 mmol/L 141  142  139   Potassium 3.5 - 5.2 mmol/L 4.5  4.3  4.2   Chloride 96 - 106 mmol/L 100  102  101   CO2 20 - 29 mmol/L 23  19  25    Calcium 8.7 - 10.2 mg/dL 9.8  9.4  9.4   Total Protein 6.0 - 8.5 g/dL  6.2    Total Bilirubin 0.0 - 1.2 mg/dL  0.3    Alkaline Phos 44 - 121 IU/L  103    AST 0 - 40 IU/L  28    ALT 0 - 32 IU/L  29       No results found for: "CEA1", "CEA" / No results found for: "CEA1", "CEA" No results found for: "PSA1" No results found for: "NWG956" No results found for: "CAN125"  Lab Results  Component Value Date   ALBUMINELP 4.0 09/23/2022   A1GS 0.3 09/23/2022   A2GS 0.8 09/23/2022   BETS 0.5 09/23/2022   BETA2SER 0.4 09/23/2022   GAMS 0.6 (L) 09/23/2022   SPEI  09/23/2022     Comment:     . Consistent with hypogammaglobulinemia. Serum free light  chains or urine immunofixation should be considered if  plasma cell dyscrasias are a possible clinical  diagnosis. .    Lab Results  Component Value Date   TIBC 393 11/24/2023   TIBC 384 08/08/2022   FERRITIN 24 11/24/2023   FERRITIN 15 08/08/2022   IRONPCTSAT 9 (LL) 11/24/2023   IRONPCTSAT 8 (LL) 08/08/2022   No results found for: "LDH"   STUDIES:   No results found.

## 2023-12-16 NOTE — Patient Instructions (Addendum)
[facility]  Discharge Instructions  You were seen and examined today by Dr. Ellin Saba. Dr. Ellin Saba is a hematologist, meaning that he specializes in blood abnormalities. Dr. Ellin Saba discussed your past medical history, family history of cancers/blood conditions and the events that led to you being here today.  You were referred to Dr. Ellin Saba due to anemia.  Dr. Ellin Saba has recommended iron infusions due to your iron levels being low.  Follow-up as scheduled.  Thank you for choosing  [facility] to provide your oncology and hematology care.   To afford each patient quality time with our provider, please arrive at least 15 minutes before your scheduled appointment time. You may need to reschedule your appointment if you arrive late (10 or more minutes). Arriving late affects you and other patients whose appointments are after yours.  Also, if you miss three or more appointments without notifying the office, you may be dismissed from the clinic at the provider's discretion.    Again, thank you for choosing [facility].  Our hope is that these requests will decrease the amount of time that you wait before being seen by our physicians.   If you have a lab appointment with the Cancer Center - please note that after April 8th, all labs will be drawn in the cancer center.  You do not have to check in or register with the main entrance as you have in the past but will complete your check-in at the cancer center.            _____________________________________________________________  Should you have questions after your visit to [facility], please contact our office at [phone] and follow the prompts.  Our office hours are 8:00 a.m. to 4:30 p.m. Monday - Thursday and 8:00 a.m. to 2:30 p.m. Friday.  Please note that voicemails left after 4:00 p.m. may not be returned until the following business day.  We  are closed weekends and all major holidays.  You do have access to a nurse 24-7, just call the main number to the clinic [phone] and do not press any options, hold on the line and a nurse will answer the phone.    For prescription refill requests, have your pharmacy contact our office and allow 72 hours.    Masks are no longer required in the cancer centers. If you would like for your care team to wear a mask while they are taking care of you, please let them know. You may have one support person who is at least 58 years old accompany you for your appointments.

## 2023-12-22 ENCOUNTER — Ambulatory Visit: Payer: Medicare Other

## 2023-12-23 ENCOUNTER — Ambulatory Visit: Payer: Medicare Other | Admitting: Neurology

## 2023-12-25 ENCOUNTER — Encounter: Payer: Self-pay | Admitting: "Endocrinology

## 2023-12-25 ENCOUNTER — Other Ambulatory Visit: Payer: Self-pay

## 2023-12-25 DIAGNOSIS — E1140 Type 2 diabetes mellitus with diabetic neuropathy, unspecified: Secondary | ICD-10-CM

## 2023-12-25 MED ORDER — RELION PEN NEEDLES 31G X 6 MM MISC: 5 refills | Status: DC

## 2023-12-26 ENCOUNTER — Other Ambulatory Visit: Payer: Self-pay

## 2023-12-26 DIAGNOSIS — Z794 Long term (current) use of insulin: Secondary | ICD-10-CM

## 2023-12-26 MED ORDER — RELION PEN NEEDLES 31G X 6 MM MISC: 5 refills | Status: DC

## 2023-12-29 ENCOUNTER — Ambulatory Visit: Payer: Medicare Other | Admitting: Physical Therapy

## 2024-01-02 ENCOUNTER — Inpatient Hospital Stay: Payer: Medicare Other | Attending: Hematology

## 2024-01-02 VITALS — BP 124/81 | HR 100 | Temp 98.0°F | Resp 18

## 2024-01-02 DIAGNOSIS — E611 Iron deficiency: Secondary | ICD-10-CM | POA: Insufficient documentation

## 2024-01-02 MED ORDER — METHYLPREDNISOLONE SODIUM SUCC 125 MG IJ SOLR
125.0000 mg | Freq: Once | INTRAMUSCULAR | Status: AC
  Administered 2024-01-02: 125 mg via INTRAVENOUS
  Filled 2024-01-02: qty 2

## 2024-01-02 MED ORDER — IRON DEXTRAN 50 MG/ML IJ SOLN
50.0000 mg | Freq: Once | INTRAMUSCULAR | Status: AC
  Administered 2024-01-02: 50 mg via INTRAVENOUS
  Filled 2024-01-02: qty 1

## 2024-01-02 MED ORDER — SODIUM CHLORIDE 0.9 % IV SOLN: INTRAVENOUS | Status: DC

## 2024-01-02 MED ORDER — ACETAMINOPHEN 325 MG PO TABS
650.0000 mg | ORAL_TABLET | Freq: Once | ORAL | Status: AC
Start: 2024-01-02 — End: 2024-01-02
  Administered 2024-01-02: 650 mg via ORAL
  Filled 2024-01-02: qty 2

## 2024-01-02 MED ORDER — FAMOTIDINE IN NACL 20-0.9 MG/50ML-% IV SOLN
20.0000 mg | Freq: Once | INTRAVENOUS | Status: AC
  Administered 2024-01-02: 20 mg via INTRAVENOUS
  Filled 2024-01-02: qty 50

## 2024-01-02 MED ORDER — CETIRIZINE HCL 10 MG/ML IV SOLN
10.0000 mg | Freq: Once | INTRAVENOUS | Status: AC
  Administered 2024-01-02: 10 mg via INTRAVENOUS
  Filled 2024-01-02: qty 1

## 2024-01-02 MED ORDER — SODIUM CHLORIDE 0.9 % IV SOLN
950.0000 mg | Freq: Once | INTRAVENOUS | Status: AC
  Administered 2024-01-02: 950 mg via INTRAVENOUS
  Filled 2024-01-02: qty 19

## 2024-01-02 NOTE — Patient Instructions (Signed)
 [facility] [place] - A DEPT OF [facility]  Discharge Instructions: Thank you for choosing [facility] to provide your oncology and hematology care.  If you have a lab appointment with the Cancer Center - please note that after April 8th, 2024, all labs will be drawn in the cancer center.  You do not have to check in or register with the main entrance as you have in the past but will complete your check-in in the cancer center.  Wear comfortable clothing and clothing appropriate for easy access to any Portacath or PICC line.   We strive to give you quality time with your provider. You may need to reschedule your appointment if you arrive late (15 or more minutes).  Arriving late affects you and other patients whose appointments are after yours.  Also, if you miss three or more appointments without notifying the office, you may be dismissed from the clinic at the provider's discretion.      For prescription refill requests, have your pharmacy contact our office and allow 72 hours for refills to be completed.    Today you received the following chemotherapy and/or immunotherapy agents Infed. Iron Dextran Injection What is this medication? IRON DEXTRAN (EYE ern DEX tran) treats low levels of iron in your body. Iron is a mineral that plays an important role in making red blood cells, which carry oxygen from your lungs to the rest of your body. This medicine may be used for other purposes; ask your health care provider or pharmacist if you have questions. COMMON BRAND NAME(S): Dexferrum, INFeD What should I tell my care team before I take this medication? They need to know if you have any of these conditions: Anemia not caused by low iron levels Heart disease High levels of iron in the blood Kidney disease Liver disease An unusual or allergic reaction to iron, other medications, foods, dyes, or preservatives Pregnant or trying to get  pregnant Breastfeeding How should I use this medication? This medication is for injection into a vein or a muscle. It is given in a hospital or clinic setting. Talk to your care team about the use of this medication in children. While this medication may be prescribed for children as young as 58 months old for selected conditions, precautions do apply. Overdosage: If you think you have taken too much of this medicine contact a poison control center or emergency room at once. NOTE: This medicine is only for you. Do not share this medicine with others. What if I miss a dose? Keep appointments for follow-up doses. It is important not to miss your dose. Call your care team if you are unable to keep an appointment. What may interact with this medication? Do not take this medication with any of the following: Deferoxamine Dimercaprol Other iron products This medication may also interact with the following: Chloramphenicol Deferasirox This list may not describe all possible interactions. Give your health care provider a list of all the medicines, herbs, non-prescription drugs, or dietary supplements you use. Also tell them if you smoke, drink alcohol, or use illegal drugs. Some items may interact with your medicine. What should I watch for while using this medication? Visit your care team for regular checks on your progress. Tell your care team if your symptoms do not start to get better or if they get worse. You may need blood work while taking this medication. You may need to eat more foods that contain iron.  Talk to your care team. Foods that contain iron include whole grains/cereals, dried fruits, beans, peas, leafy green vegetables, and organ meats (liver, kidney). Long-term use of this medication may increase your risk of some cancers. Talk to your care team about your risk of cancer. What side effects may I notice from receiving this medication? Side effects that you should report to your care  team as soon as possible: Allergic reactions--skin rash, itching, hives, swelling of the face, lips, tongue, or throat Low blood pressure--dizziness, feeling faint or lightheaded, blurry vision Shortness of breath Side effects that usually do not require medical attention (report to your care team if they continue or are bothersome): Flushing Headache Joint pain Muscle pain Nausea Pain, redness, or irritation at injection site This list may not describe all possible side effects. Call your doctor for medical advice about side effects. You may report side effects to FDA at 1-800-FDA-1088. Where should I keep my medication? This medication is given in a hospital or clinic. It will not be stored at home. NOTE: This sheet is a summary. It may not cover all possible information. If you have questions about this medicine, talk to your doctor, pharmacist, or health care provider.  2024 Elsevier/Gold Standard (2022-06-26 00:00:00)       To help prevent nausea and vomiting after your treatment, we encourage you to take your nausea medication as directed.  BELOW ARE SYMPTOMS THAT SHOULD BE REPORTED IMMEDIATELY: *FEVER GREATER THAN 100.4 F (38 C) OR HIGHER *CHILLS OR SWEATING *NAUSEA AND VOMITING THAT IS NOT CONTROLLED WITH YOUR NAUSEA MEDICATION *UNUSUAL SHORTNESS OF BREATH *UNUSUAL BRUISING OR BLEEDING *URINARY PROBLEMS (pain or burning when urinating, or frequent urination) *BOWEL PROBLEMS (unusual diarrhea, constipation, pain near the anus) TENDERNESS IN MOUTH AND THROAT WITH OR WITHOUT PRESENCE OF ULCERS (sore throat, sores in mouth, or a toothache) UNUSUAL RASH, SWELLING OR PAIN  UNUSUAL VAGINAL DISCHARGE OR ITCHING   Items with * indicate a potential emergency and should be followed up as soon as possible or go to the Emergency Department if any problems should occur.  Please show the CHEMOTHERAPY ALERT CARD or IMMUNOTHERAPY ALERT CARD at check-in to the Emergency Department and  triage nurse.  Should you have questions after your visit or need to cancel or reschedule your appointment, please contact [facility] [place] - A DEPT OF Eligha Bridegroom [facility] [phone]  and follow the prompts.  Office hours are 8:00 a.m. to 4:30 p.m. Monday - Friday. Please note that voicemails left after 4:00 p.m. may not be returned until the following business day.  We are closed weekends and major holidays. You have access to a nurse at all times for urgent questions. Please call the main number to the clinic [phone] and follow the prompts.  For any non-urgent questions, you may also contact your provider using MyChart. We now offer e-Visits for anyone 42 and older to request care online for non-urgent symptoms. For details visit [email or web address].   Also download the MyChart app! Go to the app store, search "MyChart", open the app, select [facility] [place], and log in with your MyChart username and password.

## 2024-01-02 NOTE — Progress Notes (Signed)
 Patient tolerated iron infusion with no complaints voiced.  Peripheral IV site clean and dry with good blood return noted before and after infusion.  Band aid applied.  VSS with discharge and left in satisfactory condition with no s/s of distress noted.

## 2024-01-05 ENCOUNTER — Encounter: Payer: Medicare Other | Admitting: Physical Therapy

## 2024-01-07 ENCOUNTER — Other Ambulatory Visit: Payer: Self-pay | Admitting: Internal Medicine

## 2024-01-07 ENCOUNTER — Encounter: Payer: Self-pay | Admitting: Internal Medicine

## 2024-01-07 DIAGNOSIS — M7989 Other specified soft tissue disorders: Secondary | ICD-10-CM

## 2024-01-11 ENCOUNTER — Encounter: Payer: Self-pay | Admitting: Neurology

## 2024-01-11 ENCOUNTER — Other Ambulatory Visit: Payer: Self-pay | Admitting: "Endocrinology

## 2024-01-11 ENCOUNTER — Encounter: Payer: Self-pay | Admitting: "Endocrinology

## 2024-01-11 ENCOUNTER — Other Ambulatory Visit: Payer: Self-pay | Admitting: Internal Medicine

## 2024-01-11 DIAGNOSIS — E1165 Type 2 diabetes mellitus with hyperglycemia: Secondary | ICD-10-CM

## 2024-01-11 DIAGNOSIS — I10 Essential (primary) hypertension: Secondary | ICD-10-CM

## 2024-01-12 ENCOUNTER — Telehealth: Payer: Self-pay | Admitting: Neurology

## 2024-01-12 ENCOUNTER — Encounter: Payer: Self-pay | Admitting: Physical Medicine and Rehabilitation

## 2024-01-12 ENCOUNTER — Other Ambulatory Visit: Payer: Self-pay | Admitting: Internal Medicine

## 2024-01-12 ENCOUNTER — Ambulatory Visit: Payer: Medicare Other | Admitting: Neurology

## 2024-01-12 ENCOUNTER — Encounter: Payer: Medicare Other | Admitting: Physical Therapy

## 2024-01-12 NOTE — Telephone Encounter (Signed)
 Pt requesting VV due to weather in her area. Reached out to [facility] who stated she needs to r/s. Rescheduled and wait listed

## 2024-01-12 NOTE — Progress Notes (Deleted)
 [id number]    Provider:  Dr Ines Requesting Provider: Tobie Suzzane POUR, MD Primary Care Provider:  Tobie Suzzane POUR, MD  CC:  Unsteadiness   01/12/2024: We sent her to PT and ordered MRi cervical spine at last appointment I don;t see where she had it completed.   09/16/2023: No show.   HPI 12/13/2020:  Sydney Taylor is a 58 y.o. female here as requested by Tobie Suzzane POUR, MD for dizziness. PMHx sleep panea, prediabetes, migraine, HTN, HLD, Fibromyalgia, depression, bipolar disease, anxiety, anemia. Patient says the main problem she has is unsteadiness in er legs, better if walking fast, no falls, no weakness, been ongoing a while maybe a year, she feels she has to concentrate to go ina straight line, Uncle with parkinson's, no tremors, no stiffness, she has Pain pretty much constantly but no neck pain or shooting pain in the arms or legs, she has not been dropping things, No significant numbness or tingling, no tremor, no falls. Episodes are random, she does not feel it today. Mostly if she Is walking slowly, walking fast is better. No problems walking. Not lightheaded. No other focal neurologic deficits, associated symptoms, inciting events or modifiable factors.  Reviewed notes, labs and imaging from outside physicians, which showed o  B12 300, cbc/bmp unremarkable, ana negative, RF neg, crp normal, sed rate normal, tsh   MRI of the brain wo contrast 08/05/2020: personally reviewed images and agree with findings below  IMPRESSION: Negative MRI head no acute abnormality   Mild hyperintensity in the pons and in the white matter which is likely due to mild chronic microvascular ischemia.  Review of Systems: Patient complains of symptoms per HPI as well as the following symptoms: dizziness, insomnia, headache, feeling hot, joint pain, aching muscles, fatigue, blurriness. Pertinent negatives and positives per HPI. All others negative.  HPI 12/13/2020:  Sydney Taylor is a 58 y.o. female here as requested by Tobie Suzzane POUR, MD for dizziness. PMHx sleep panea, prediabetes, migraine, HTN, HLD, Fibromyalgia, depression, bipolar disease, anxiety, anemia. Patient says the main problem she has is unsteadiness in er legs, better if walking fast, no falls, no weakness, been ongoing a while maybe a year, she feels she has to concentrate to go ina straight line, Uncle with parkinson's, no tremors, no stiffness, she has Pain pretty much constantly but no neck pain or shooting pain in the arms or legs, she has not been dropping things, No significant numbness or tingling, no tremor, no falls. Episodes are random, she does not feel it today. Mostly if she Is walking slowly, walking fast is better. No problems walking. Not lightheaded. No other focal neurologic deficits, associated symptoms, inciting events or modifiable factors.  Reviewed notes, labs and imaging from outside physicians, which showed o  B12 300, cbc/bmp unremarkable, ana negative, RF neg, crp normal, sed rate normal, tsh   MRI of the brain wo contrast 08/05/2020: personally reviewed images and agree with findings below  IMPRESSION: Negative MRI head no acute abnormality   Mild hyperintensity in the pons and in the white matter which is likely due to mild chronic microvascular ischemia.  Review of Systems: Patient complains of symptoms per HPI as well as the following symptoms: dizziness, insomnia, headache, feeling hot, joint pain, aching muscles, fatigue, blurriness. Pertinent negatives and positives per HPI. All others negative.   Social History   Socioeconomic History   Marital status: Married    Spouse name: Not on file   Number of  children: 0   Years of education: Not on file   Highest education level: Bachelor's degree (e.g., BA, AB, BS)  Occupational History   Occupation: research associate/drug development  Tobacco Use   Smoking status: Former    Current packs/day: 0.00    Types:  Cigarettes    Quit date: 12/30/1982    Years since quitting: 41.0   Smokeless tobacco: Never  Vaping Use   Vaping status: Never Used  Substance and Sexual Activity   Alcohol use: Not Currently    Alcohol/week: 0.0 standard drinks of alcohol   Drug use: Never   Sexual activity: Yes    Birth control/protection: Post-menopausal  Other Topics Concern   Not on file  Social History Narrative   Married for 3 years.Lives with husband.Bachelors in Building Surveyor.   Right handed   Caffeine: 1-2 per day    Social Drivers of Health   Financial Resource Strain: Low Risk  (09/18/2023)   Overall Financial Resource Strain (CARDIA)    Difficulty of Paying Living Expenses: Not very hard  Food Insecurity: No Food Insecurity (12/16/2023)   Hunger Vital Sign    Worried About Running Out of Food in the Last Year: Never true    Ran Out of Food in the Last Year: Never true  Transportation Needs: No Transportation Needs (12/16/2023)   PRAPARE - Administrator, Civil Service (Medical): No    Lack of Transportation (Non-Medical): No  Physical Activity: Insufficiently Active (09/18/2023)   Exercise Vital Sign    Days of Exercise per Week: 1 day    Minutes of Exercise per Session: 30 min  Stress: Stress Concern Present (09/18/2023)   [employer] of Occupational Health - Occupational Stress Questionnaire    Feeling of Stress : To some extent  Social Connections: Unknown (09/18/2023)   Social Connection and Isolation Panel [NHANES]    Frequency of Communication with Friends and Family: Twice a week    Frequency of Social Gatherings with Friends and Family: Patient declined    Attends Religious Services: 1 to 4 times per year    Active Member of [employer] or Organizations: No    Attends Engineer, Structural: More than 4 times per year    Marital Status: Married  Catering Manager Violence: Not At Risk (12/16/2023)   Humiliation, Afraid, Rape, and Kick questionnaire    Fear of Current or  Ex-Partner: No    Emotionally Abused: No    Physically Abused: No    Sexually Abused: No    Family History  Problem Relation Age of Onset   Hypertension Father    Hyperlipidemia Father    Diabetes Father    Stroke Father    Depression Father    Anxiety disorder Father    Hypertension Mother    Hyperlipidemia Mother    Diabetes Mellitus II Mother    Congestive Heart Failure Mother    Heart attack Mother    Hypertension Sister    Diabetes Sister    Hypertension Brother    Autoimmune disease Brother    Diabetes Maternal Grandmother    Lupus Paternal Grandmother    Depression Paternal Grandmother    Anxiety disorder Paternal Grandmother    Depression Maternal Aunt    Anxiety disorder Maternal Aunt    Colon cancer Neg Hx    Esophageal cancer Neg Hx    Gastric cancer Neg Hx     Past Medical History:  Diagnosis Date   Anemia    Anxiety  Arrhythmia    Bipolar depression (HCC) 12/28/2019   Chronic fatigue    Chronic headaches    Depression    Diabetes mellitus without complication (HCC)    Fibromyalgia    GERD (gastroesophageal reflux disease)    High cholesterol    HLD (hyperlipidemia)    Hypertension    Insect bite    Migraine    Prediabetes    Sleep apnea    Urinary incontinence    Vasculitis [facility])     Patient Active Problem List   Diagnosis Date Noted   Iron  deficiency 11/26/2023   Hypersomnolence 05/28/2023   Recurrent sinusitis 03/17/2023   Abnormal EKG 02/10/2023   Epigastric pain determined by examination 02/10/2023   Acute right-sided thoracic back pain 01/30/2023   Myofascial pain dysfunction syndrome 01/27/2023   Chronic fatigue 10/07/2022   Cutaneous vasculitis 09/23/2022   Diarrhea 07/30/2022   Rash and nonspecific skin eruption 06/07/2022   Essential hypertension 06/03/2022   Fatty liver 03/05/2022   Encounter for general adult medical examination with abnormal findings 02/01/2022   Plantar fasciitis 02/01/2022   Chest pain 01/14/2022    Chronic migraine with aura 10/29/2021   Diabetic polyneuropathy associated with type 2 diabetes mellitus (HCC) 09/19/2021   History of hormone therapy 09/19/2021   Bilateral primary osteoarthritis of knee 04/10/2021   Perennial allergic rhinitis 04/06/2021   Allergic contact dermatitis 04/06/2021   Urticaria 04/06/2021   Fibromyalgia 01/12/2021   Elevated C-reactive protein (CRP) 01/12/2021   Diverticulosis of colon without hemorrhage 01/02/2021   Major depressive disorder, recurrent (HCC) 07/14/2020   GERD (gastroesophageal reflux disease) 05/11/2020   Type 2 diabetes mellitus (HCC) 03/27/2020   Bipolar depression (HCC) 12/28/2019   OSA on CPAP 12/28/2019   Tachycardia 10/31/2019   Hyperlipidemia 10/30/2015   Morbid obesity (HCC) 10/30/2015    Past Surgical History:  Procedure Laterality Date   BIOPSY  09/04/2022   Procedure: BIOPSY;  Surgeon: Shaaron Lamar HERO, MD;  Location: [facility];  Service: Endoscopy;;   CHOLECYSTECTOMY N/A 03/15/2022   Procedure: LAPAROSCOPIC CHOLECYSTECTOMY WITH INTRAOPERATIVE CHOLANGIOGRAM;  Surgeon: Kallie Manuelita BROCKS, MD;  Location: [facility];  Service: General;  Laterality: N/A;   CHOLECYSTECTOMY  02/03/2022   COLONOSCOPY  2017   per patient normal and needs next one in 2027 (Dr. Tobie)   COLONOSCOPY WITH PROPOFOL  N/A 09/04/2022   Procedure: COLONOSCOPY WITH PROPOFOL ;  Surgeon: Shaaron Lamar HERO, MD;  Location: [facility];  Service: Endoscopy;  Laterality: N/A;   ESOPHAGOGASTRODUODENOSCOPY N/A 05/12/2020   Procedure: ESOPHAGOGASTRODUODENOSCOPY (EGD);  Surgeon: Shaaron Lamar HERO, MD;  Location: [facility];  Service: Endoscopy;  Laterality: N/A;  11:15am   Joint fusion on foot Right    LIVER BIOPSY N/A 03/15/2022   Procedure: LIVER BIOPSY;  Surgeon: Kallie Manuelita BROCKS, MD;  Location: [facility];  Service: General;  Laterality: N/A;   MALONEY DILATION N/A 05/12/2020   Procedure: AGAPITO HODGKIN;  Surgeon: Shaaron Lamar HERO, MD;  Location: [facility];   Service: Endoscopy;  Laterality: N/A;   NASAL SEPTUM SURGERY     POLYPECTOMY  09/04/2022   Procedure: POLYPECTOMY;  Surgeon: Shaaron Lamar HERO, MD;  Location: [facility];  Service: Endoscopy;;   SINOSCOPY      Current Outpatient Medications  Medication Sig Dispense Refill   Acetylcysteine (N-ACETYL CYSTEINE) 600 MG CAPS Take 1 capsule by mouth daily.     ALPRAZolam (XANAX) 0.5 MG tablet Take 0.25 mg by mouth daily as needed for anxiety.  B-12 Methylcobalamin 1000 MCG TBDP Take 1,000 mcg by mouth daily.     blood glucose meter kit and supplies KIT Inject 1 each into the skin daily. Check blood glucose once a day. Diagnosis code: E 11.9. 1 each 0   calcium  carbonate (TUMS - DOSED IN MG ELEMENTAL CALCIUM ) 500 MG chewable tablet Chew 1,000 mg by mouth daily as needed for indigestion or heartburn.     chlorthalidone  (HYGROTON ) 25 MG tablet Take 1/2 (one-half) tablet by mouth once daily 45 tablet 0   Continuous Glucose Receiver (DEXCOM G7 RECEIVER) DEVI 1 each by Does not apply route continuous.     cyclobenzaprine  (FLEXERIL ) 5 MG tablet Take 1 tablet (5 mg total) by mouth 2 (two) times daily as needed. 30 tablet 1   DULoxetine (CYMBALTA) 60 MG capsule Take 120 mg by mouth every morning. Two capsules (120mg ) daily     Exenatide  ER (BYDUREON  BCISE) 2 MG/0.85ML AUIJ Inject 2 mg into the skin once a week. 3.4 mL 0   gabapentin  (NEURONTIN ) 300 MG capsule Take 1 capsule (300 mg total) by mouth at bedtime. X 1 week then can increase to 600 mg QHS if possible- for FMS 60 capsule 5   glucose blood (ACCU-CHEK GUIDE) test strip 1 each by Other route in the morning, at noon, and at bedtime. Use as instructed 300 each 3   insulin  degludec (TRESIBA  FLEXTOUCH) 200 UNIT/ML FlexTouch Pen Inject 170 Units into the skin daily. 81 mL 0   Insulin  Pen Needle (PEN NEEDLES) 32G X 4 MM MISC Check BS 2 x a day 200 each 5   Insulin  Pen Needle (RELION PEN NEEDLES) 31G X 6 MM MISC USE 2 PEN NEEDLEs to check blood glucose  twice a day 50 each 5   JARDIANCE  25 MG TABS tablet TAKE 1 TABLET BY MOUTH ONCE DAILY BEFORE BREAKFAST 30 tablet 0   L-Methylfolate 15 MG TABS Take 15 mg by mouth daily at 6 (six) AM.     losartan  (COZAAR ) 25 MG tablet Take 1 tablet by mouth once daily 90 tablet 0   metFORMIN  (GLUCOPHAGE -XR) 500 MG 24 hr tablet Take 2 tablets (1,000 mg total) by mouth daily with supper. 180 tablet 1   metoprolol  tartrate (LOPRESSOR ) 25 MG tablet Take 1 tablet by mouth twice daily 180 tablet 2   modafinil (PROVIGIL) 100 MG tablet Take 100 mg by mouth every morning.     Multiple Vitamins-Minerals (MULTIVITAMIN WOMEN 50+) TABS Take 1 tablet by mouth daily.     NONFORMULARY OR COMPOUNDED ITEM Take 4 mg by mouth at bedtime. X 2 weeks, then 8 mg nightly- for nerve pain and chronic fatigue- sent in 2 week Rx for 4 mg and 6 months supply for 8 mg- sent to [facility] (Patient taking differently: Take 8 mg by mouth at bedtime. Lotroxone 8 mg)     omega-3 acid ethyl esters (LOVAZA ) 1 g capsule Take 2 capsules (2 g total) by mouth 2 (two) times daily. 120 capsule 5   pantoprazole  (PROTONIX ) 40 MG tablet Take 1 tablet (40 mg total) by mouth daily. 90 tablet 1   potassium chloride  SA (KLOR-CON  M) 20 MEQ tablet Take 2 tablets (40 mEq total) by mouth 2 (two) times daily. (Patient taking differently: Take 40 mEq by mouth daily.) 720 tablet 0   rosuvastatin  (CRESTOR ) 5 MG tablet Take 1 tablet (5 mg total) by mouth daily. 90 tablet 1   tirzepatide  (MOUNJARO ) 5 MG/0.5ML Pen Inject 5 mg into the  skin once a week. 2 mL 1   TRESIBA  FLEXTOUCH 200 UNIT/ML FlexTouch Pen INJECT 170 UNITS SUBCUTANEOUSLY ONCE DAILY 18 mL 0   No current facility-administered medications for this visit.    Allergies as of 01/12/2024 - Review Complete 12/16/2023  Allergen Reaction Noted   Cephalexin  Itching, Swelling, and Other (See Comments) 05/02/2020    Vitals: LMP  (LMP Unknown)  Last Weight:  Wt Readings from Last 1 Encounters:  12/16/23  277 lb 6.4 oz (125.8 kg)   Last Height:   Ht Readings from Last 1 Encounters:  12/16/23 5' 10 (1.778 m)     Physical exam: Exam: Gen: NAD, conversant, well nourised, obese, well groomed                     CV: RRR, no MRG. No Carotid Bruits. No peripheral edema, warm, nontender Eyes: Conjunctivae clear without exudates or hemorrhage  Neuro: Detailed Neurologic Exam  Speech:    Speech is normal; fluent and spontaneous with normal comprehension.  Cognition:    The patient is oriented to person, place, and time;     recent and remote memory intact;     language fluent;     normal attention, concentration,     fund of knowledge Cranial Nerves:    The pupils are equal, round, and reactive to light. The fundi areflat. Visual fields are full to finger confrontation. Extraocular movements are intact. Trigeminal sensation is intact and the muscles of mastication are normal. The face is symmetric. The palate elevates in the midline. Hearing intact. Voice is normal. Shoulder shrug is normal. The tongue has normal motion without fasciculations.   Coordination:    Normal finger to nose. No dysmetria or ataxia  Gait:    Heel and toe walking are normal. Difficulty with tandem but can complete. Decreased right arm swing.   Motor Observation:    No asymmetry, no atrophy, and no involuntary movements noted. Tone:    Normal muscle tone.    Posture:    Posture is normal. normal erect    Strength:    Strength is V/V in the upper and lower limbs.      Sensation: intact to LT     Reflex Exam:  DTR's:    Deep tendon reflexes in the upper and lower extremities are normal bilaterally.   Toes:    The toes are downgoing bilaterally.   Clonus:    Clonus is absent.    Assessment/Plan:  58 y.o. female here as requested by Elnor Lauraine BRAVO, NP for dizziness. PMHx sleep panea, prediabetes, migraine, HTN, HLD, Fibromyalgia, depression, bipolar disease, anxiety, anemia. Patient says the main  problem she has is unsteadiness. Dizziness is rare. She denies weakness, sensory changes, falls, lightheadedness, tremor, or any other associated symptom. Exam showed some imbalance with heel-to-toe and mild decreased arm swing on the right but normal stance and stride, tone normal, strength intact, no abnormal movements, gets up without use of hands. She does have brisk reflexes so will check an MRI of her cervical spine. She does not practice any balance exercises at home so will send to PT for balance.   Plan from 2021:  - Physical Therapy: [facility] in [place] - MRI Cervical Spine in [place] - I also discussed the mild white matter changes in the brain and discussed keeping vascular risk factors tightly controlled - I discussed with patient if she feels improved with PT she can follow up as needed. But if she  has new symptoms or has worsening of unsteadiness to email me and we will have her back to the office to monitor clinically or perform more testing. She agreed.     No orders of the defined types were placed in this encounter.   Cc: Tobie Suzzane POUR, MD,  Tobie Suzzane POUR, MD  Onetha Epp, MD  [facility] [address] [place], [place] [address]  Phone [phone] Fax [phone]

## 2024-01-12 NOTE — Telephone Encounter (Signed)
 Pt called in. She was advised to reschedule for in-office visit by phone staff per Dr Lucia Gaskins.

## 2024-01-16 ENCOUNTER — Encounter: Payer: Self-pay | Admitting: Physical Medicine and Rehabilitation

## 2024-01-16 ENCOUNTER — Encounter
Payer: Medicare Other | Attending: Physical Medicine and Rehabilitation | Admitting: Physical Medicine and Rehabilitation

## 2024-01-16 DIAGNOSIS — R5382 Chronic fatigue, unspecified: Secondary | ICD-10-CM

## 2024-01-16 DIAGNOSIS — M7918 Myalgia, other site: Secondary | ICD-10-CM | POA: Diagnosis not present

## 2024-01-16 DIAGNOSIS — M797 Fibromyalgia: Secondary | ICD-10-CM

## 2024-01-16 NOTE — Patient Instructions (Signed)
Pt is a 58 yr old female with hx of fibromyalgia- since 1985, myofascial pain and chronic low back pain who's being followed by Dr Dallas Schimke, ortho for low back- also has B/L knee DJD- BMI 39.89- down to 39.60 currently; also OSA- uses CPA - is compliant   Restart Low dose naltrexone- at 8 mg nightly- can restart tonight.   2.  Has to put pause on trp injections due to cost-    3. Will f/u at 4/ months instead of 3 months-   4. Went over that low dose naltrexone is on low doses under 10 mg-

## 2024-01-16 NOTE — Progress Notes (Addendum)
Subjective:    Patient ID: Sydney Taylor, female    DOB: 1966-10-23, 58 y.o.   MRN: [id number]  HPI An video tele-health visit is felt to be the most appropriate encounter for this patient at this time. This is a follow up tele-visit via video. The patient is at home. MD is at office. Prior to scheduling this appointment, our staff discussed the limitations of evaluation and management by telemedicine and the availability of in-person appointments. The patient expressed understanding and agreed to proceed.   Pt is a 58 yr old female with hx of fibromyalgia- since 1985, myofascial pain and chronic low back pain who's being followed by Dr Dallas Schimke, ortho for low back- also has B/L knee DJD- BMI 39.89- down to 39.60 currently; also OSA- uses CPA - is compliant   Cannot do TRP injections anymore due to cost.   Naltrexone going pretty well. Thinks it's helping-  25% or so helpful.   Have not gotten back to senior sneakers yet.    Hasn't tried any CBD products yet.     Took Oxycodone Tuesday night, for dental pain-   But wondering if could restart Naltrexone-    Pain Inventory Average Pain 4 Pain Right Now 4 My pain is aching and sore  In the last 24 hours, has pain interfered with the following? General activity 0 Relation with others 0 Enjoyment of life 4 What TIME of day is your pain at its worst? varies Sleep (in general) Fair  Pain is worse with: some activites and emotional stress Pain improves with: rest Relief from Meds:  no pain  Family History  Problem Relation Age of Onset   Hypertension Father    Hyperlipidemia Father    Diabetes Father    Stroke Father    Depression Father    Anxiety disorder Father    Hypertension Mother    Hyperlipidemia Mother    Diabetes Mellitus II Mother    Congestive Heart Failure Mother    Heart attack Mother    Hypertension Sister    Diabetes Sister    Hypertension Brother    Autoimmune disease Brother    Diabetes  Maternal Grandmother    Lupus Paternal Grandmother    Depression Paternal Grandmother    Anxiety disorder Paternal Grandmother    Depression Maternal Aunt    Anxiety disorder Maternal Aunt    Colon cancer Neg Hx    Esophageal cancer Neg Hx    Gastric cancer Neg Hx    Social History   Socioeconomic History   Marital status: Married    Spouse name: Not on file   Number of children: 0   Years of education: Not on file   Highest education level: Bachelor's degree (e.g., BA, AB, BS)  Occupational History   Occupation: research associate/drug development  Tobacco Use   Smoking status: Former    Current packs/day: 0.00    Types: Cigarettes    Quit date: 12/30/1982    Years since quitting: 41.0   Smokeless tobacco: Never  Vaping Use   Vaping status: Never Used  Substance and Sexual Activity   Alcohol use: Not Currently    Alcohol/week: 0.0 standard drinks of alcohol   Drug use: Never   Sexual activity: Yes    Birth control/protection: Post-menopausal  Other Topics Concern   Not on file  Social History Narrative   Married for 3 years.Lives with husband.Bachelors in Building surveyor.   Right handed   Caffeine: 1-2 per day  Social Drivers of Corporate investment banker Strain: Low Risk  (09/18/2023)   Overall Financial Resource Strain (CARDIA)    Difficulty of Paying Living Expenses: Not very hard  Food Insecurity: No Food Insecurity (12/16/2023)   Hunger Vital Sign    Worried About Running Out of Food in the Last Year: Never true    Ran Out of Food in the Last Year: Never true  Transportation Needs: No Transportation Needs (12/16/2023)   PRAPARE - Administrator, Civil Service (Medical): No    Lack of Transportation (Non-Medical): No  Physical Activity: Insufficiently Active (09/18/2023)   Exercise Vital Sign    Days of Exercise per Week: 1 day    Minutes of Exercise per Session: 30 min  Stress: Stress Concern Present (09/18/2023)   [employer] of Occupational  Health - Occupational Stress Questionnaire    Feeling of Stress : To some extent  Social Connections: Unknown (09/18/2023)   Social Connection and Isolation Panel [NHANES]    Frequency of Communication with Friends and Family: Twice a week    Frequency of Social Gatherings with Friends and Family: Patient declined    Attends Religious Services: 1 to 4 times per year    Active Member of Clubs or Organizations: No    Attends Engineer, structural: More than 4 times per year    Marital Status: Married   Past Surgical History:  Procedure Laterality Date   BIOPSY  09/04/2022   Procedure: BIOPSY;  Surgeon: Corbin Ade, MD;  [facility];  Service: Endoscopy;;   CHOLECYSTECTOMY N/A 03/15/2022   Procedure: LAPAROSCOPIC CHOLECYSTECTOMY WITH INTRAOPERATIVE CHOLANGIOGRAM;  Surgeon: Lucretia Roers, MD;  Location: [facility];  Service: General;  Laterality: N/A;   CHOLECYSTECTOMY  02/03/2022   COLONOSCOPY  2017   per patient normal and needs next one in 2027 (Dr. Allena Katz)   COLONOSCOPY WITH PROPOFOL N/A 09/04/2022   Procedure: COLONOSCOPY WITH PROPOFOL;  Surgeon: Corbin Ade, MD;  [facility];  Service: Endoscopy;  Laterality: N/A;   ESOPHAGOGASTRODUODENOSCOPY N/A 05/12/2020   Procedure: ESOPHAGOGASTRODUODENOSCOPY (EGD);  Surgeon: Corbin Ade, MD;  [facility];  Service: Endoscopy;  Laterality: N/A;  11:15am   Joint fusion on foot Right    LIVER BIOPSY N/A 03/15/2022   Procedure: LIVER BIOPSY;  Surgeon: Lucretia Roers, MD;  Location: [facility];  Service: General;  Laterality: N/A;   MALONEY DILATION N/A 05/12/2020   Procedure: Alvy Beal;  Surgeon: Corbin Ade, MD;  [facility];  Service: Endoscopy;  Laterality: N/A;   NASAL SEPTUM SURGERY     POLYPECTOMY  09/04/2022   Procedure: POLYPECTOMY;  Surgeon: Corbin Ade, MD;  [facility];  Service: Endoscopy;;   SINOSCOPY     Past Surgical History:  Procedure  Laterality Date   BIOPSY  09/04/2022   Procedure: BIOPSY;  Surgeon: Corbin Ade, MD;  [facility];  Service: Endoscopy;;   CHOLECYSTECTOMY N/A 03/15/2022   Procedure: LAPAROSCOPIC CHOLECYSTECTOMY WITH INTRAOPERATIVE CHOLANGIOGRAM;  Surgeon: Lucretia Roers, MD;  [facility]: [facility];  Service: General;  Laterality: N/A;   CHOLECYSTECTOMY  02/03/2022   COLONOSCOPY  2017   per patient normal and needs next one in 2027 (Dr. Allena Katz)   COLONOSCOPY WITH PROPOFOL N/A 09/04/2022   Procedure: COLONOSCOPY WITH PROPOFOL;  Surgeon: Corbin Ade, MD;  [facility];  Service: Endoscopy;  Laterality: N/A;   ESOPHAGOGASTRODUODENOSCOPY N/A 05/12/2020  Procedure: ESOPHAGOGASTRODUODENOSCOPY (EGD);  Surgeon: Corbin Ade, MD;  [facility];  Service: Endoscopy;  Laterality: N/A;  11:15am   Joint fusion on foot Right    LIVER BIOPSY N/A 03/15/2022   Procedure: LIVER BIOPSY;  Surgeon: Lucretia Roers, MD;  [facility]: [facility];  Service: General;  Laterality: N/A;   MALONEY DILATION N/A 05/12/2020   Procedure: Alvy Beal;  Surgeon: Corbin Ade, MD;  [facility];  Service: Endoscopy;  Laterality: N/A;   NASAL SEPTUM SURGERY     POLYPECTOMY  09/04/2022   Procedure: POLYPECTOMY;  Surgeon: Corbin Ade, MD;  [facility];  Service: Endoscopy;;   SINOSCOPY     Past Medical History:  Diagnosis Date   Anemia    Anxiety    Arrhythmia    Bipolar depression (HCC) 12/28/2019   Chronic fatigue    Chronic headaches    Depression    Diabetes mellitus without complication (HCC)    Fibromyalgia    GERD (gastroesophageal reflux disease)    High cholesterol    HLD (hyperlipidemia)    Hypertension    Insect bite    Migraine    Prediabetes    Sleep apnea    Urinary incontinence    Vasculitis (HCC)    LMP  (LMP Unknown)   Opioid Risk Score:   Fall Risk Score:  `1  Depression screen PHQ 2/9     12/16/2023    1:35 PM 10/24/2023     3:12 PM 09/19/2023   10:04 AM 09/03/2023    2:54 PM 06/11/2023    1:59 PM 04/25/2023    1:11 PM 03/05/2023    8:21 AM  Depression screen PHQ 2/9  Decreased Interest 0 0 0 0 0 2 3  Down, Depressed, Hopeless 0 0 0 0 2 2 3   PHQ - 2 Score 0 0 0 0 2 4 6   Altered sleeping   0 0 2  1  Tired, decreased energy   0 0 3  3  Change in appetite   0 0 2  2  Feeling bad or failure about yourself    0 0 0  1  Trouble concentrating   0 0 3  3  Moving slowly or fidgety/restless   0 0 0  1  Suicidal thoughts   0 0 1  1  PHQ-9 Score   0 0 13  18  Difficult doing work/chores    Not difficult at all Very difficult  Very difficult     Review of Systems  Musculoskeletal:  Positive for arthralgias and myalgias.       Shoulder blades  All other systems reviewed and are negative.     Objective:   Physical Exam   On line webex     Assessment & Plan:   Pt is a 58 yr old female with hx of fibromyalgia- since 1985, myofascial pain and chronic low back pain who's being followed by Dr Dallas Schimke, ortho for low back- also has B/L knee DJD- BMI 39.89- down to 39.60 currently; also OSA- uses CPA - is compliant   Restart Low dose naltrexone- at 8 mg nightly- can restart tonight.   2.  Has to put pause on trp injections due to cost-    3. Will f/u at 4/ months instead of 3 months-   4. Went over that low dose naltrexone is on low doses under 10 mg-   5. Talked at length about Sliver sneakers-  getting some exercise and CBD products for sleep- since not getting pain meds form me.

## 2024-01-22 ENCOUNTER — Telehealth: Payer: Self-pay | Admitting: Physical Therapy

## 2024-01-22 ENCOUNTER — Ambulatory Visit: Payer: Medicare Other | Admitting: Physical Therapy

## 2024-01-22 NOTE — Telephone Encounter (Signed)
Left pt a message to call if she wants to come to a 3:30 appt today

## 2024-01-26 ENCOUNTER — Encounter: Payer: Self-pay | Admitting: Physical Therapy

## 2024-01-26 ENCOUNTER — Ambulatory Visit: Payer: Medicare Other | Admitting: Physical Therapy

## 2024-01-26 DIAGNOSIS — M6281 Muscle weakness (generalized): Secondary | ICD-10-CM | POA: Diagnosis present

## 2024-01-26 DIAGNOSIS — M5459 Other low back pain: Secondary | ICD-10-CM | POA: Diagnosis present

## 2024-01-26 DIAGNOSIS — M62838 Other muscle spasm: Secondary | ICD-10-CM | POA: Diagnosis present

## 2024-01-26 DIAGNOSIS — R278 Other lack of coordination: Secondary | ICD-10-CM | POA: Insufficient documentation

## 2024-01-26 DIAGNOSIS — R29898 Other symptoms and signs involving the musculoskeletal system: Secondary | ICD-10-CM | POA: Diagnosis present

## 2024-01-26 DIAGNOSIS — R2689 Other abnormalities of gait and mobility: Secondary | ICD-10-CM | POA: Diagnosis present

## 2024-01-26 NOTE — Therapy (Addendum)
 OUTPATIENT PHYSICAL THERAPY FEMALE PELVIC TREATMENT/ Later date discharge   Patient Name: Sydney Taylor MRN: [id number] DOB:March 10, 1966, 58 y.o., female Today's Date: 01/26/2024  END OF SESSION:  PT End of Session - 01/26/24 1612     Visit Number 2    Date for PT Re-Evaluation 02/09/24    Authorization Type [employer] MCR [id number]  AUTH REQUIRED    PT Start Time 1525    PT Stop Time 1608    PT Time Calculation (min) 43 min    Activity Tolerance Patient tolerated treatment well    Behavior During Therapy WFL for tasks assessed/performed              Past Medical History:  Diagnosis Date   Anemia    Anxiety    Arrhythmia    Bipolar depression (HCC) 12/28/2019   Chronic fatigue    Chronic headaches    Depression    Diabetes mellitus without complication (HCC)    Fibromyalgia    GERD (gastroesophageal reflux disease)    High cholesterol    HLD (hyperlipidemia)    Hypertension    Insect bite    Migraine    Prediabetes    Sleep apnea    Urinary incontinence    Vasculitis (HCC)    Past Surgical History:  Procedure Laterality Date   BIOPSY  09/04/2022   Procedure: BIOPSY;  Surgeon: Corbin Ade, MD;  [facility];  Service: Endoscopy;;   CHOLECYSTECTOMY N/A 03/15/2022   Procedure: LAPAROSCOPIC CHOLECYSTECTOMY WITH INTRAOPERATIVE CHOLANGIOGRAM;  Surgeon: Lucretia Roers, MD;  Location: [facility];  Service: General;  Laterality: N/A;   CHOLECYSTECTOMY  02/03/2022   COLONOSCOPY  2017   per patient normal and needs next one in 2027 (Dr. Allena Katz)   COLONOSCOPY WITH PROPOFOL N/A 09/04/2022   Procedure: COLONOSCOPY WITH PROPOFOL;  Surgeon: Corbin Ade, MD;  [facility];  Service: Endoscopy;  Laterality: N/A;   ESOPHAGOGASTRODUODENOSCOPY N/A 05/12/2020   Procedure: ESOPHAGOGASTRODUODENOSCOPY (EGD);  Surgeon: Corbin Ade, MD;  [facility];  Service: Endoscopy;  Laterality: N/A;  11:15am   Joint fusion on foot Right    LIVER BIOPSY N/A  03/15/2022   Procedure: LIVER BIOPSY;  Surgeon: Lucretia Roers, MD;  [facility]: [facility];  Service: General;  Laterality: N/A;   MALONEY DILATION N/A 05/12/2020   Procedure: Alvy Beal;  Surgeon: Corbin Ade, MD;  [facility];  Service: Endoscopy;  Laterality: N/A;   NASAL SEPTUM SURGERY     POLYPECTOMY  09/04/2022   Procedure: POLYPECTOMY;  Surgeon: Corbin Ade, MD;  [facility];  Service: Endoscopy;;   SINOSCOPY     Patient Active Problem List   Diagnosis Date Noted   Iron deficiency 11/26/2023   Hypersomnolence 05/28/2023   Recurrent sinusitis 03/17/2023   Abnormal EKG 02/10/2023   Epigastric pain determined by examination 02/10/2023   Acute right-sided thoracic back pain 01/30/2023   Myofascial pain dysfunction syndrome 01/27/2023   Chronic fatigue 10/07/2022   Cutaneous vasculitis 09/23/2022   Diarrhea 07/30/2022   Rash and nonspecific skin eruption 06/07/2022   Essential hypertension 06/03/2022   Fatty liver 03/05/2022   Encounter for general adult medical examination with abnormal findings 02/01/2022   Plantar fasciitis 02/01/2022   Chest pain 01/14/2022   Chronic migraine with aura 10/29/2021   Diabetic polyneuropathy associated with type 2 diabetes mellitus (HCC) 09/19/2021   History of hormone therapy 09/19/2021   Bilateral primary osteoarthritis of knee 04/10/2021  Perennial allergic rhinitis 04/06/2021   Allergic contact dermatitis 04/06/2021   Urticaria 04/06/2021   Fibromyalgia 01/12/2021   Elevated C-reactive protein (CRP) 01/12/2021   Diverticulosis of colon without hemorrhage 01/02/2021   Major depressive disorder, recurrent (HCC) 07/14/2020   GERD (gastroesophageal reflux disease) 05/11/2020   Type 2 diabetes mellitus (HCC) 03/27/2020   Bipolar depression (HCC) 12/28/2019   OSA on CPAP 12/28/2019   Tachycardia 10/31/2019   Hyperlipidemia 10/30/2015   Morbid obesity (HCC) 10/30/2015    PCP: Anabel Halon,  MD PCP - General  REFERRING PROVIDER: Luella Cook, MD Ref Provider  REFERRING DIAG: R10.2 (ICD-10-CM) - Pelvic and perineal pain   THERAPY DIAG:  Other muscle spasm  Other abnormalities of gait and mobility  Other symptoms and signs involving the musculoskeletal system  Other lack of coordination  Other low back pain  Muscle weakness (generalized)  Rationale for Evaluation and Treatment: Rehabilitation  ONSET DATE: 2012  SUBJECTIVE:                                                                                                                                                                                           SUBJECTIVE STATEMENT: Pt comes to PT after about 5 weeks, she was sick and had a root canal infection  Pt reports that she has bought the dilators. She has not used them yet Pt reports that she had pain in her shoulders, it is taxing to hold them up. Stiffness and soreness.  Pt retired. Feeds chickens, walks to the barn. Errands.    Fluid intake- iced coffee, diet soda, sparkling water.   PAIN:  Are you having pain? Yes fibromyalgia, fatigue NPRS scale: 6/10 Pain location: Internal and Deep with intercourse  Pain type: soreness Pain description:  soreness    Aggravating factors: intercourse Relieving factors: stopping intercourse  PRECAUTIONS: None  RED FLAGS: None   WEIGHT BEARING RESTRICTIONS: No  FALLS:  Has patient fallen in last 6 months? No  LIVING ENVIRONMENT: Lives with: lives with their spouse Lives in: House/apartment Stairs: No Has following equipment at home: None  OCCUPATION: retired, does housework, feels Programme researcher, broadcasting/film/video, errands  PLOF: Independent  PATIENT GOALS: would like to have intercourse without pain, minimize incontinence  PERTINENT HISTORY:  Gallbladder removal 2023 Mid 20's surgery on vaginal septum (Dr said that there was scar tissue per pt) Right foot fusion  Sexual abuse: No  BOWEL MOVEMENT:no issues Pain  with bowel movement: No   URINATION: Pain with urination: No Fully empty bladder: Yes:   Stream:  doesn't know Urgency: Yes:   Frequency: no Leakage: Urge to void and Walking to the bathroom  Pads: Yes: 1 Takes diuretic and another drug that makes you pee out sugar  INTERCOURSE: Pain with intercourse:  most pain with thrusting, different angles- anything other than missionary position is more painful Ability to have vaginal penetration:  Yes:   Climax: not vaginally, otherwise Marinoff Scale: 1/3  PREGNANCY: Vaginal deliveries - no children  PROLAPSE: None   OBJECTIVE:  Note: Objective measures were completed at Evaluation unless otherwise noted.   PATIENT SURVEYS:    PFIQ-7 90  COGNITION: Overall cognitive status: Within functional limits for tasks assessed     SENSATION: Light touch: Appears intact Proprioception: Appears intact  MUSCLE LENGTH: Hamstrings: Right 80 deg; Left 80 deg LUMBAR SPECIAL TESTS:  Straight leg raise test: Negative   Gait- antalgic   POSTURE: rounded shoulders and forward head  PELVIC ALIGNMENT:seems even  LUMBARAROM/PROM: slightly limited throughout   LOWER EXTREMITY ROM: grossly within functional limitations    LOWER EXTREMITY MMT: at least 4/5 grossly overall  PALPATION:   General  within functional limitations                 External Perineal Exam within functional limitations                              Internal Pelvic Floor tight and tender throughout  Patient confirms identification and approves PT to assess internal pelvic floor and treatment Yes  PELVIC MMT:   MMT eval  Vaginal 3/5  Internal Anal Sphincter   External Anal Sphincter   Puborectalis   Diastasis Recti no  (Blank rows = not tested)        TONE: Low abdominal tone, high tone in pelvic floor throughout and glutes  PROLAPSE: None in hooklying  TODAY'S TREATMENT:                                                                                                                               DATE:01/26/24       Neuro reed- cat/ cow, corner stretch, diaphragmatic breathing, child's pose- difficult, butterfly stretch        Therapeutic activities- bladder retraining, dilator training, bladder irritants  PATIENT EDUCATION:  Education details: lubricants, Ohnut, relevant anatomy, superficial pelvic floor massage, dilators for pelvic floor release Person educated: Patient Education method: Explanation, Demonstration, Tactile cues, Verbal cues, and Handouts Education comprehension: verbalized understanding and needs further education  HOME EXERCISE PROGRAM: [id number]  ASSESSMENT:  CLINICAL IMPRESSION: Tx focus- review of HEP, dilator training education, bladder retraining ( pt to only drink water for 2 weeks) Pt will benefit from PT to reduce pain and urge incontinence and improve quality of life.   OBJECTIVE IMPAIRMENTS: Abnormal gait, decreased coordination, decreased knowledge of condition, decreased ROM, decreased strength, increased muscle spasms, obesity, and pain.   ACTIVITY LIMITATIONS: continence and toileting  PARTICIPATION LIMITATIONS: interpersonal relationship  PERSONAL FACTORS: Age and Time since onset of injury/illness/exacerbation are also  affecting patient's functional outcome.   REHAB POTENTIAL: Good  CLINICAL DECISION MAKING: Stable/uncomplicated  EVALUATION COMPLEXITY: Low   GOALS: Goals reviewed with patient? Yes  SHORT TERM GOALS: Target date: 01/12/2024    Pt will be I with her HEP and dem exercises correctly and consistently Baseline: Goal status: INITIAL  2.  Pt will be I with urge drill Baseline:  Goal status: INITIAL  3.  Pt will be I with perineal massage Baseline:  Goal status: INITIAL  4.  Pt will be I with dilator use Baseline:  Goal status: INITIAL   LONG TERM GOALS: Target date: 02/09/2024    Pt will report max 2/10 pain with intercourse Baseline:  Goal  status: INITIAL  2.  Pt will soak 0 pads/day Baseline:  Goal status: INITIAL  3.  Pt will be I with advanced HEP Baseline:  Goal status: INITIAL   PLAN:  PT FREQUENCY: 1-2x/week  PT DURATION: 8 weeks  PLANNED INTERVENTIONS: 97110-Therapeutic exercises, 97530- Therapeutic activity, 97112- Neuromuscular re-education, 97535- Self Care, [id number]- Manual therapy, [phone]- Electrical stimulation (manual), Dry Needling, Joint mobilization, Joint manipulation, Spinal manipulation, Spinal mobilization, DME instructions, Moist heat, and Biofeedback  PLAN FOR NEXT SESSION: stretches, urge drill    Markale Birdsell, PT 01/26/24 4:13 PM  PHYSICAL THERAPY DISCHARGE SUMMARY   Patient agrees to discharge. Patient goals were not met. Patient is being discharged due to the patient's request.

## 2024-01-28 ENCOUNTER — Ambulatory Visit (INDEPENDENT_AMBULATORY_CARE_PROVIDER_SITE_OTHER): Payer: Medicare Other | Admitting: Internal Medicine

## 2024-01-28 ENCOUNTER — Encounter: Payer: Self-pay | Admitting: Hematology

## 2024-01-28 ENCOUNTER — Encounter: Payer: Self-pay | Admitting: Internal Medicine

## 2024-01-28 VITALS — BP 139/83 | HR 114 | Ht 70.0 in | Wt 271.4 lb

## 2024-01-28 DIAGNOSIS — J069 Acute upper respiratory infection, unspecified: Secondary | ICD-10-CM | POA: Insufficient documentation

## 2024-01-28 MED ORDER — AZITHROMYCIN 250 MG PO TABS: ORAL_TABLET | ORAL | 0 refills | Status: AC

## 2024-01-28 NOTE — Progress Notes (Signed)
Acute Office Visit  Subjective:    Patient ID: Sydney Taylor, female    DOB: Jun 24, 1966, 58 y.o.   MRN: [id number]  Chief Complaint  Patient presents with   URI    Pt reports sx of nasal congestion, cough, body aches and scratchy throat , no fever, also reports lymph node swelling. Reports sx started on 01/25/2024.     HPI Patient is in today for complaint of nasal congestion, postnasal drip, sore throat and cervical LAD for the last 4 days.  She has history of chronic sinusitis, but her symptoms have been worse recently.  Denies any fever or chills.  Her husband also has similar symptoms, went to urgent care and tested negative for COVID, flu and RSV.  She denies dyspnea or wheezing currently.  Past Medical History:  Diagnosis Date   Anemia    Anxiety    Arrhythmia    Bipolar depression (HCC) 12/28/2019   Chronic fatigue    Chronic headaches    Depression    Diabetes mellitus without complication (HCC)    Fibromyalgia    GERD (gastroesophageal reflux disease)    High cholesterol    HLD (hyperlipidemia)    Hypertension    Insect bite    Migraine    Prediabetes    Sleep apnea    Urinary incontinence    Vasculitis (HCC)     Past Surgical History:  Procedure Laterality Date   BIOPSY  09/04/2022   Procedure: BIOPSY;  Surgeon: Corbin Ade, MD;  Location: [facility];  Service: Endoscopy;;   CHOLECYSTECTOMY N/A 03/15/2022   Procedure: LAPAROSCOPIC CHOLECYSTECTOMY WITH INTRAOPERATIVE CHOLANGIOGRAM;  Surgeon: Lucretia Roers, MD;  Location: [facility];  Service: General;  Laterality: N/A;   CHOLECYSTECTOMY  02/03/2022   COLONOSCOPY  2017   per patient normal and needs next one in 2027 (Dr. Allena Katz)   COLONOSCOPY WITH PROPOFOL N/A 09/04/2022   Procedure: COLONOSCOPY WITH PROPOFOL;  Surgeon: Corbin Ade, MD;  Location: [facility];  Service: Endoscopy;  Laterality: N/A;   ESOPHAGOGASTRODUODENOSCOPY N/A 05/12/2020   Procedure: ESOPHAGOGASTRODUODENOSCOPY (EGD);   Surgeon: Corbin Ade, MD;  Location: [facility];  Service: Endoscopy;  Laterality: N/A;  11:15am   Joint fusion on foot Right    LIVER BIOPSY N/A 03/15/2022   Procedure: LIVER BIOPSY;  Surgeon: Lucretia Roers, MD;  Location: [facility];  Service: General;  Laterality: N/A;   MALONEY DILATION N/A 05/12/2020   Procedure: Alvy Beal;  Surgeon: Corbin Ade, MD;  Location: [facility];  Service: Endoscopy;  Laterality: N/A;   NASAL SEPTUM SURGERY     POLYPECTOMY  09/04/2022   Procedure: POLYPECTOMY;  Surgeon: Corbin Ade, MD;  Location: [facility];  Service: Endoscopy;;   SINOSCOPY      Family History  Problem Relation Age of Onset   Hypertension Father    Hyperlipidemia Father    Diabetes Father    Stroke Father    Depression Father    Anxiety disorder Father    Hypertension Mother    Hyperlipidemia Mother    Diabetes Mellitus II Mother    Congestive Heart Failure Mother    Heart attack Mother    Hypertension Sister    Diabetes Sister    Hypertension Brother    Autoimmune disease Brother    Diabetes Maternal Grandmother    Lupus Paternal Grandmother    Depression Paternal Grandmother    Anxiety disorder Paternal Grandmother    Depression  Maternal Aunt    Anxiety disorder Maternal Aunt    Colon cancer Neg Hx    Esophageal cancer Neg Hx    Gastric cancer Neg Hx     Social History   Socioeconomic History   Marital status: Married    Spouse name: Not on file   Number of children: 0   Years of education: Not on file   Highest education level: Bachelor's degree (e.g., BA, AB, BS)  Occupational History   Occupation: research associate/drug development  Tobacco Use   Smoking status: Former    Current packs/day: 0.00    Types: Cigarettes    Quit date: 12/30/1982    Years since quitting: 41.1   Smokeless tobacco: Never  Vaping Use   Vaping status: Never Used  Substance and Sexual Activity   Alcohol use: Not Currently    Alcohol/week: 0.0 standard  drinks of alcohol   Drug use: Never   Sexual activity: Yes    Birth control/protection: Post-menopausal  Other Topics Concern   Not on file  Social History Narrative   Married for 3 years.Lives with husband.Bachelors in Building surveyor.   Right handed   Caffeine: 1-2 per day    Social Drivers of Health   Financial Resource Strain: Low Risk  (09/18/2023)   Overall Financial Resource Strain (CARDIA)    Difficulty of Paying Living Expenses: Not very hard  Food Insecurity: No Food Insecurity (12/16/2023)   Hunger Vital Sign    Worried About Running Out of Food in the Last Year: Never true    Ran Out of Food in the Last Year: Never true  Transportation Needs: No Transportation Needs (12/16/2023)   PRAPARE - Administrator, Civil Service (Medical): No    Lack of Transportation (Non-Medical): No  Physical Activity: Insufficiently Active (09/18/2023)   Exercise Vital Sign    Days of Exercise per Week: 1 day    Minutes of Exercise per Session: 30 min  Stress: Stress Concern Present (09/18/2023)   [employer] of Occupational Health - Occupational Stress Questionnaire    Feeling of Stress : To some extent  Social Connections: Unknown (09/18/2023)   Social Connection and Isolation Panel [NHANES]    Frequency of Communication with Friends and Family: Twice a week    Frequency of Social Gatherings with Friends and Family: Patient declined    Attends Religious Services: 1 to 4 times per year    Active Member of [employer] or Organizations: No    Attends Engineer, structural: More than 4 times per year    Marital Status: Married  Catering manager Violence: Not At Risk (12/16/2023)   Humiliation, Afraid, Rape, and Kick questionnaire    Fear of Current or Ex-Partner: No    Emotionally Abused: No    Physically Abused: No    Sexually Abused: No    Outpatient Medications Prior to Visit  Medication Sig Dispense Refill   Acetylcysteine (N-ACETYL CYSTEINE) 600 MG CAPS Take 1 capsule  by mouth daily.     ALPRAZolam (XANAX) 0.5 MG tablet Take 0.25 mg by mouth daily as needed for anxiety.     B-12 Methylcobalamin 1000 MCG TBDP Take 1,000 mcg by mouth daily.     blood glucose meter kit and supplies KIT Inject 1 each into the skin daily. Check blood glucose once a day. Diagnosis code: E 11.9. 1 each 0   calcium carbonate (TUMS - DOSED IN MG ELEMENTAL CALCIUM) 500 MG chewable tablet Chew 1,000 mg by mouth  daily as needed for indigestion or heartburn.     chlorthalidone (HYGROTON) 25 MG tablet Take 1/2 (one-half) tablet by mouth once daily 45 tablet 0   Continuous Glucose Receiver (DEXCOM G7 RECEIVER) DEVI 1 each by Does not apply route continuous.     cyclobenzaprine (FLEXERIL) 5 MG tablet Take 1 tablet (5 mg total) by mouth 2 (two) times daily as needed. 30 tablet 1   DULoxetine (CYMBALTA) 60 MG capsule Take 120 mg by mouth every morning. Two capsules (120mg ) daily     gabapentin (NEURONTIN) 300 MG capsule Take 1 capsule (300 mg total) by mouth at bedtime. X 1 week then can increase to 600 mg QHS if possible- for FMS 60 capsule 5   glucose blood (ACCU-CHEK GUIDE) test strip 1 each by Other route in the morning, at noon, and at bedtime. Use as instructed 300 each 3   insulin degludec (TRESIBA FLEXTOUCH) 200 UNIT/ML FlexTouch Pen Inject 170 Units into the skin daily. 81 mL 0   Insulin Pen Needle (PEN NEEDLES) 32G X 4 MM MISC Check BS 2 x a day 200 each 5   Insulin Pen Needle (RELION PEN NEEDLES) 31G X 6 MM MISC USE 2 PEN NEEDLEs to check blood glucose twice a day 50 each 5   JARDIANCE 25 MG TABS tablet TAKE 1 TABLET BY MOUTH ONCE DAILY BEFORE BREAKFAST 30 tablet 0   L-Methylfolate 15 MG TABS Take 15 mg by mouth daily at 6 (six) AM.     losartan (COZAAR) 25 MG tablet Take 1 tablet by mouth once daily 90 tablet 0   metFORMIN (GLUCOPHAGE-XR) 500 MG 24 hr tablet Take 2 tablets (1,000 mg total) by mouth daily with supper. 180 tablet 1   metoprolol tartrate (LOPRESSOR) 25 MG tablet Take 1  tablet by mouth twice daily 180 tablet 2   modafinil (PROVIGIL) 100 MG tablet Take 100 mg by mouth every morning.     Multiple Vitamins-Minerals (MULTIVITAMIN WOMEN 50+) TABS Take 1 tablet by mouth daily.     NONFORMULARY OR COMPOUNDED ITEM Take 4 mg by mouth at bedtime. X 2 weeks, then 8 mg nightly- for nerve pain and chronic fatigue- sent in 2 week Rx for 4 mg and 6 months supply for 8 mg- sent to [facility] (Patient taking differently: Take 8 mg by mouth at bedtime. Lotroxone 8 mg)     omega-3 acid ethyl esters (LOVAZA) 1 g capsule Take 2 capsules (2 g total) by mouth 2 (two) times daily. 120 capsule 5   pantoprazole (PROTONIX) 40 MG tablet Take 1 tablet (40 mg total) by mouth daily. 90 tablet 1   potassium chloride SA (KLOR-CON M) 20 MEQ tablet Take 2 tablets by mouth twice daily 360 tablet 0   rosuvastatin (CRESTOR) 5 MG tablet Take 1 tablet (5 mg total) by mouth daily. 90 tablet 1   tirzepatide (MOUNJARO) 5 MG/0.5ML Pen Inject 5 mg into the skin once a week. 2 mL 1   TRESIBA FLEXTOUCH 200 UNIT/ML FlexTouch Pen INJECT 170 UNITS SUBCUTANEOUSLY ONCE DAILY 18 mL 0   Exenatide ER (BYDUREON BCISE) 2 MG/0.85ML AUIJ Inject 2 mg into the skin once a week. 3.4 mL 0   No facility-administered medications prior to visit.    Allergies  Allergen Reactions   Cephalexin Itching, Swelling and Other (See Comments)    Review of Systems  Constitutional:  Positive for fatigue. Negative for chills and fever.  HENT:  Positive for congestion and sinus pressure. Negative for  ear discharge.   Respiratory:  Positive for cough. Negative for shortness of breath and wheezing.   Cardiovascular:  Negative for chest pain and palpitations.  Gastrointestinal:  Negative for diarrhea and vomiting.  Genitourinary:  Negative for dysuria and hematuria.  Musculoskeletal:  Positive for back pain. Negative for neck pain and neck stiffness.  Skin:  Negative for rash.  Neurological:  Positive for headaches. Negative  for dizziness and weakness.  Psychiatric/Behavioral:  Negative for agitation and behavioral problems.        Objective:    Physical Exam Constitutional:      General: She is not in acute distress.    Appearance: She is obese. She is not diaphoretic.  HENT:     Head: Normocephalic and atraumatic.     Nose: Congestion present.     Right Sinus: Frontal sinus tenderness present.     Left Sinus: Frontal sinus tenderness present.     Mouth/Throat:     Mouth: Mucous membranes are moist.     Pharynx: Posterior oropharyngeal erythema present.  Eyes:     General: No scleral icterus.    Extraocular Movements: Extraocular movements intact.  Cardiovascular:     Rate and Rhythm: Normal rate and regular rhythm.     Heart sounds: Normal heart sounds. No murmur heard. Pulmonary:     Breath sounds: Normal breath sounds. No wheezing or rales.  Musculoskeletal:     Cervical back: Neck supple. No tenderness.     Right lower leg: Edema (Trace) present.     Left lower leg: Edema (Trace) present.  Skin:    General: Skin is dry.     Coloration: Skin is not pale.     Findings: No rash.  Neurological:     General: No focal deficit present.     Mental Status: She is alert and oriented to person, place, and time.  Psychiatric:        Mood and Affect: Affect is flat.        Behavior: Behavior normal.        Thought Content: Thought content normal.     BP 139/83   Pulse (!) 114   Ht 5\' 10"  (1.778 m)   Wt 271 lb 6.4 oz (123.1 kg)   LMP  (LMP Unknown)   SpO2 93%   BMI 38.94 kg/m  Wt Readings from Last 3 Encounters:  01/28/24 271 lb 6.4 oz (123.1 kg)  12/16/23 277 lb 6.4 oz (125.8 kg)  11/24/23 277 lb (125.6 kg)        Assessment & Plan:   Problem List Items Addressed This Visit       Respiratory   URTI (acute upper respiratory infection) - Primary   Her current symptoms are likely due to acute sinusitis Check flu, COVID and RSV testing Started empiric azithromycin considering  recent worsening of her symptoms and her chronic medical conditions Continue Claritin for allergies Added azelastine nasal spray Has been seen by ENT specialist and allergy specialist      Relevant Medications   azithromycin (ZITHROMAX) 250 MG tablet   Other Relevant Orders   COVID-19, Flu A+B and RSV     Meds ordered this encounter  Medications   azithromycin (ZITHROMAX) 250 MG tablet    Sig: Take 2 tablets on day 1, then 1 tablet daily on days 2 through 5    Dispense:  6 tablet    Refill:  0     Marisal Swarey Concha Se, MD

## 2024-01-28 NOTE — Assessment & Plan Note (Signed)
Her current symptoms are likely due to acute sinusitis Check flu, COVID and RSV testing Started empiric azithromycin considering recent worsening of her symptoms and her chronic medical conditions Continue Claritin for allergies Added azelastine nasal spray Has been seen by ENT specialist and allergy specialist

## 2024-01-31 LAB — COVID-19, FLU A+B AND RSV
Influenza A, NAA: NOT DETECTED
Influenza B, NAA: NOT DETECTED
RSV, NAA: NOT DETECTED
SARS-CoV-2, NAA: NOT DETECTED

## 2024-02-02 ENCOUNTER — Ambulatory Visit: Payer: Medicare Other | Admitting: Neurology

## 2024-02-03 ENCOUNTER — Encounter: Payer: Self-pay | Admitting: Physical Therapy

## 2024-02-05 ENCOUNTER — Encounter: Payer: Self-pay | Admitting: Internal Medicine

## 2024-02-06 ENCOUNTER — Other Ambulatory Visit: Payer: Self-pay | Admitting: Internal Medicine

## 2024-02-06 DIAGNOSIS — G43E19 Chronic migraine with aura, intractable, without status migrainosus: Secondary | ICD-10-CM

## 2024-02-06 MED ORDER — SUMATRIPTAN SUCCINATE 100 MG PO TABS: 100.0000 mg | ORAL_TABLET | ORAL | 2 refills | Status: DC | PRN

## 2024-02-12 ENCOUNTER — Ambulatory Visit: Payer: Medicare Other | Admitting: Physical Therapy

## 2024-02-16 ENCOUNTER — Ambulatory Visit: Payer: Medicare Other | Admitting: Physical Therapy

## 2024-02-16 ENCOUNTER — Telehealth: Payer: Self-pay | Admitting: Physical Therapy

## 2024-02-16 DIAGNOSIS — M6281 Muscle weakness (generalized): Secondary | ICD-10-CM | POA: Insufficient documentation

## 2024-02-16 DIAGNOSIS — R2689 Other abnormalities of gait and mobility: Secondary | ICD-10-CM | POA: Insufficient documentation

## 2024-02-16 DIAGNOSIS — R29898 Other symptoms and signs involving the musculoskeletal system: Secondary | ICD-10-CM | POA: Insufficient documentation

## 2024-02-16 DIAGNOSIS — M5459 Other low back pain: Secondary | ICD-10-CM | POA: Insufficient documentation

## 2024-02-16 DIAGNOSIS — R278 Other lack of coordination: Secondary | ICD-10-CM | POA: Insufficient documentation

## 2024-02-16 DIAGNOSIS — M62838 Other muscle spasm: Secondary | ICD-10-CM | POA: Insufficient documentation

## 2024-02-16 NOTE — Telephone Encounter (Signed)
Left pt a message re no show for her PT appt.

## 2024-02-19 ENCOUNTER — Ambulatory Visit: Payer: Medicare Other | Admitting: "Endocrinology

## 2024-02-19 ENCOUNTER — Other Ambulatory Visit: Payer: Self-pay | Admitting: "Endocrinology

## 2024-02-19 DIAGNOSIS — E1165 Type 2 diabetes mellitus with hyperglycemia: Secondary | ICD-10-CM

## 2024-02-19 DIAGNOSIS — Z794 Long term (current) use of insulin: Secondary | ICD-10-CM

## 2024-02-19 DIAGNOSIS — E1140 Type 2 diabetes mellitus with diabetic neuropathy, unspecified: Secondary | ICD-10-CM

## 2024-02-23 ENCOUNTER — Ambulatory Visit: Payer: Medicare Other | Admitting: Physical Therapy

## 2024-02-24 ENCOUNTER — Other Ambulatory Visit: Payer: Self-pay

## 2024-03-01 ENCOUNTER — Encounter: Payer: Medicare Other | Admitting: Physical Therapy

## 2024-03-04 ENCOUNTER — Encounter: Payer: Self-pay | Admitting: "Endocrinology

## 2024-03-04 ENCOUNTER — Telehealth: Payer: Self-pay | Admitting: "Endocrinology

## 2024-03-04 ENCOUNTER — Other Ambulatory Visit: Payer: Self-pay

## 2024-03-04 ENCOUNTER — Telehealth: Payer: Self-pay

## 2024-03-04 DIAGNOSIS — E1140 Type 2 diabetes mellitus with diabetic neuropathy, unspecified: Secondary | ICD-10-CM

## 2024-03-04 MED ORDER — DEXCOM G7 SENSOR MISC: 3 refills | Status: AC

## 2024-03-04 NOTE — Telephone Encounter (Signed)
 Sent pt message via my chart informing pt of Rx has been faxed back to [facility].

## 2024-03-04 NOTE — Telephone Encounter (Signed)
 MEDICATION: Dexcom G7 Sensor  PHARMACY:  [facility]   HAS THE PATIENT CONTACTED THEIR PHARMACY?  Yes  IS THIS A 90 DAY SUPPLY : Yes  IS PATIENT OUT OF MEDICATION: No  IF NOT; HOW MUCH IS LEFT: 1  LAST APPOINTMENT DATE: @12 /82/2024  NEXT APPOINTMENT DATE:@4 /02/2024  DO WE HAVE YOUR PERMISSION TO LEAVE A DETAILED MESSAGE?:Yes  OTHER COMMENTS:    **Let patient know to contact pharmacy at the end of the day to make sure medication is ready. **  ** Please notify patient to allow 48-72 hours to process**  **Encourage patient to contact the pharmacy for refills or they can request refills through [facility]**

## 2024-03-11 ENCOUNTER — Other Ambulatory Visit: Payer: Self-pay

## 2024-03-11 ENCOUNTER — Encounter: Payer: Self-pay | Admitting: "Endocrinology

## 2024-03-11 DIAGNOSIS — E1165 Type 2 diabetes mellitus with hyperglycemia: Secondary | ICD-10-CM

## 2024-03-11 MED ORDER — PEN NEEDLES 32G X 4 MM MISC: 5 refills | Status: DC

## 2024-03-15 ENCOUNTER — Encounter: Payer: Medicare Other | Admitting: Physical Therapy

## 2024-03-16 ENCOUNTER — Other Ambulatory Visit: Payer: Self-pay | Admitting: "Endocrinology

## 2024-03-16 DIAGNOSIS — Z794 Long term (current) use of insulin: Secondary | ICD-10-CM

## 2024-03-16 MED ORDER — TRESIBA FLEXTOUCH 200 UNIT/ML ~~LOC~~ SOPN: 85.0000 [IU] | PEN_INJECTOR | Freq: Two times a day (BID) | SUBCUTANEOUS | 1 refills | Status: DC

## 2024-03-22 ENCOUNTER — Encounter: Payer: Medicare Other | Admitting: Internal Medicine

## 2024-03-22 ENCOUNTER — Telehealth: Payer: Self-pay

## 2024-03-22 ENCOUNTER — Other Ambulatory Visit: Payer: Self-pay | Admitting: Internal Medicine

## 2024-03-22 ENCOUNTER — Encounter: Payer: Medicare Other | Admitting: Physical Therapy

## 2024-03-22 DIAGNOSIS — E782 Mixed hyperlipidemia: Secondary | ICD-10-CM

## 2024-03-22 DIAGNOSIS — E1142 Type 2 diabetes mellitus with diabetic polyneuropathy: Secondary | ICD-10-CM

## 2024-03-22 DIAGNOSIS — I10 Essential (primary) hypertension: Secondary | ICD-10-CM

## 2024-03-22 DIAGNOSIS — E559 Vitamin D deficiency, unspecified: Secondary | ICD-10-CM

## 2024-03-22 DIAGNOSIS — F331 Major depressive disorder, recurrent, moderate: Secondary | ICD-10-CM

## 2024-03-22 NOTE — Telephone Encounter (Signed)
 Copied from CRM [id number]. Topic: Clinical - Request for Lab/Test Order >> Mar 22, 2024  9:46 AM Almira Coaster wrote: Reason for CRM: Patient is scheduled to see her endocrinologist on 04/01/2024 and she was hoping to see if Dr.Patel would be able to order labs to check her A1C. She was scheduled for her physical today but couldn't make it to the office due to a flat tire on her way to the appointment.

## 2024-03-22 NOTE — Telephone Encounter (Signed)
 Pt informed

## 2024-03-26 LAB — CMP14+EGFR
ALT: 32 IU/L (ref 0–32)
AST: 38 IU/L (ref 0–40)
Albumin: 4.1 g/dL (ref 3.8–4.9)
Alkaline Phosphatase: 110 IU/L (ref 44–121)
BUN/Creatinine Ratio: 15 (ref 9–23)
BUN: 14 mg/dL (ref 6–24)
Bilirubin Total: 0.4 mg/dL (ref 0.0–1.2)
CO2: 22 mmol/L (ref 20–29)
Calcium: 9.5 mg/dL (ref 8.7–10.2)
Chloride: 101 mmol/L (ref 96–106)
Creatinine, Ser: 0.92 mg/dL (ref 0.57–1.00)
Globulin, Total: 2.3 g/dL (ref 1.5–4.5)
Glucose: 129 mg/dL — ABNORMAL HIGH (ref 70–99)
Potassium: 3.6 mmol/L (ref 3.5–5.2)
Sodium: 141 mmol/L (ref 134–144)
Total Protein: 6.4 g/dL (ref 6.0–8.5)
eGFR: 72 mL/min/{1.73_m2} (ref >59)

## 2024-03-26 LAB — LIPID PANEL
Chol/HDL Ratio: 3.9 ratio (ref 0.0–4.4)
Cholesterol, Total: 136 mg/dL (ref 100–199)
HDL: 35 mg/dL — ABNORMAL LOW (ref >39)
LDL Chol Calc (NIH): 67 mg/dL (ref 0–99)
Triglycerides: 203 mg/dL — ABNORMAL HIGH (ref 0–149)
VLDL Cholesterol Cal: 34 mg/dL (ref 5–40)

## 2024-03-26 LAB — VITAMIN D 25 HYDROXY (VIT D DEFICIENCY, FRACTURES): Vit D, 25-Hydroxy: 41.8 ng/mL (ref 30.0–100.0)

## 2024-03-26 LAB — TSH: TSH: 1.85 u[IU]/mL (ref 0.450–4.500)

## 2024-03-26 LAB — HEMOGLOBIN A1C
Est. average glucose Bld gHb Est-mCnc: 148 mg/dL
Hgb A1c MFr Bld: 6.8 % — ABNORMAL HIGH (ref 4.8–5.6)

## 2024-03-29 ENCOUNTER — Encounter: Payer: Medicare Other | Admitting: Physical Therapy

## 2024-04-01 ENCOUNTER — Telehealth (INDEPENDENT_AMBULATORY_CARE_PROVIDER_SITE_OTHER): Admitting: "Endocrinology

## 2024-04-01 ENCOUNTER — Encounter: Payer: Self-pay | Admitting: "Endocrinology

## 2024-04-01 ENCOUNTER — Other Ambulatory Visit: Payer: Self-pay

## 2024-04-01 DIAGNOSIS — Z7984 Long term (current) use of oral hypoglycemic drugs: Secondary | ICD-10-CM | POA: Diagnosis not present

## 2024-04-01 DIAGNOSIS — E782 Mixed hyperlipidemia: Secondary | ICD-10-CM

## 2024-04-01 DIAGNOSIS — Z7985 Long-term (current) use of injectable non-insulin antidiabetic drugs: Secondary | ICD-10-CM

## 2024-04-01 DIAGNOSIS — E1165 Type 2 diabetes mellitus with hyperglycemia: Secondary | ICD-10-CM

## 2024-04-01 DIAGNOSIS — Z794 Long term (current) use of insulin: Secondary | ICD-10-CM | POA: Diagnosis not present

## 2024-04-01 MED ORDER — TIRZEPATIDE 7.5 MG/0.5ML ~~LOC~~ SOAJ: 7.5000 mg | SUBCUTANEOUS | 0 refills | Status: DC

## 2024-04-01 MED ORDER — INSULIN LISPRO (1 UNIT DIAL) 100 UNIT/ML (KWIKPEN)
1.0000 [IU] | PEN_INJECTOR | SUBCUTANEOUS | 3 refills | Status: AC | PRN
Start: 2024-04-01 — End: ?

## 2024-04-01 MED ORDER — INSULIN LISPRO (1 UNIT DIAL) 100 UNIT/ML (KWIKPEN)
1.0000 [IU] | PEN_INJECTOR | SUBCUTANEOUS | 3 refills | Status: DC | PRN
Start: 2024-04-01 — End: 2024-04-01

## 2024-04-01 NOTE — Progress Notes (Signed)
 The patient reports they are currently: Sydney Taylor. I spent 8-9 minutes on the video with the patient on the date of service. I spent an additional 10 minutes on pre- and post-visit activities on the date of service.   The patient was physically located in [place] or a state in which I am permitted to provide care. The patient and/or parent/guardian understood that s/he may incur co-pays and cost sharing, and agreed to the telemedicine visit. The visit was reasonable and appropriate under the circumstances given the patient's presentation at the time.  The patient and/or parent/guardian has been advised of the potential risks and limitations of this mode of treatment (including, but not limited to, the absence of in-person examination) and has agreed to be treated using telemedicine. The patient's/patient's family's questions regarding telemedicine have been answered.   The patient and/or parent/guardian has also been advised to contact their provider's office for worsening conditions, and seek emergency medical treatment and/or call 911 if the patient deems either necessary.    Outpatient Endocrinology Note [place], MD  04/01/24   Sydney Taylor [birth year] [id number]  Referring Provider: Anabel Halon, MD Primary Care Provider: Anabel Halon, MD Reason for consultation: Subjective   Assessment & Plan  Diagnoses and all orders for this visit:  Uncontrolled type 2 diabetes mellitus with hyperglycemia (HCC)  Long term (current) use of oral hypoglycemic drugs  Long-term (current) use of injectable non-insulin antidiabetic drugs  Long-term insulin use (HCC)  Mixed hyperlipidemia  Other orders -     tirzepatide (MOUNJARO) 7.5 MG/0.5ML Pen; Inject 7.5 mg into the skin once a week. -     Discontinue: insulin lispro (HUMALOG KWIKPEN) 100 UNIT/ML KwikPen; Inject 1-5 Units into the skin as needed. -     insulin lispro (HUMALOG KWIKPEN) 100 UNIT/ML KwikPen; Inject 1-5 Units  into the skin as needed.     Diabetes Type II complicated by neuropathy,  Lab Results  Component Value Date   GFR 57.44 (L) 08/11/2023   Hba1c goal less than 7, current Hba1c is  Lab Results  Component Value Date   HGBA1C 6.8 (H) 03/25/2024   Will recommend the following: Jardiance 25 mg daily, metformin ER 1 g at dinner Tresiba 86 units qam, 84 units qpm     Mounjaro 7.5 mg once a week Disucced S/E, no current contraindication  Humalog correction scale: Use in addition to your meal time/short acting insulin based on blood sugars as follows:  201 - 250: 2 units 251 - 300: 3 units 301 - 350: 4 units 351 - 400: 5 units  No known contraindications/side effects to any of above medications Glucagon discussed and prescribed with refills on 04/01/24  -Last LD and Tg are as follows: Lab Results  Component Value Date   LDLCALC 67 03/25/2024    Lab Results  Component Value Date   TRIG 203 (H) 03/25/2024   -On rosuvastatin 5 mg every other day due to leg cramps  -Instructed to take rosuvastatin 5 mg every day starting 08/14/23 -Follow low fat diet and exercise   -Blood pressure goal <140/90 - Microalbumin/creatinine goal is < 30 -Last MA/Cr is as follows: Lab Results  Component Value Date   MICROALBUR <0.7 04/29/2023   -on ACE/ARB losartan 25 mg qd -diet changes including salt restriction -limit eating outside -counseled BP targets per standards of diabetes care -uncontrolled blood pressure can lead to retinopathy, nephropathy and cardiovascular and atherosclerotic heart disease  Reviewed and counseled on: -A1C target -Blood  sugar targets -Complications of uncontrolled diabetes  -Checking blood sugar before meals and bedtime and bring log next visit -All medications with mechanism of action and side effects -Hypoglycemia management: rule of 15's, Glucagon Emergency Kit and medical alert ID -low-carb low-fat plate-method diet -At least 20 minutes of physical  activity per day -Annual dilated retinal eye exam and foot exam -compliance and follow up needs -follow up as scheduled or earlier if problem gets worse  Call if blood sugar is less than 70 or consistently above 250    Take a 15 gm snack of carbohydrate at bedtime before you go to sleep if your blood sugar is less than 100.    If you are going to fast after midnight for a test or procedure, ask your physician for instructions on how to reduce/decrease your insulin dose.    Call if blood sugar is less than 70 or consistently above 250  -Treating a low sugar by rule of 15  (15 gms of sugar every 15 min until sugar is more than 70) If you feel your sugar is low, test your sugar to be sure If your sugar is low (less than 70), then take 15 grams of a fast acting Carbohydrate (3-4 glucose tablets or glucose gel or 4 ounces of juice or regular soda) Recheck your sugar 15 min after treating low to make sure it is more than 70 If sugar is still less than 70, treat again with 15 grams of carbohydrate          Don't drive the hour of hypoglycemia  If unconscious/unable to eat or drink by mouth, use glucagon injection or nasal spray baqsimi and call 911. Can repeat again in 15 min if still unconscious.  Return in about 6 weeks (around 05/13/2024).   I have reviewed current medications, nurse's notes, allergies, vital signs, past medical and surgical history, family medical history, and social history for this encounter. Counseled patient on symptoms, examination findings, lab findings, imaging results, treatment decisions and monitoring and prognosis. The patient understood the recommendations and agrees with the treatment plan. All questions regarding treatment plan were fully answered.  Sydney Trego, MD  04/01/24   History of Present Illness Sydney Taylor is a 58 y.o. year old female who presents for evaluation of Type II diabetes mellitus.  Sydney Taylor was first diagnosed in 2021.    Diabetes education +  Home diabetes regimen: Jardiance 25 mg daily, metformin ER 1 g at dinner Tresiba 86 units qam, 84 units qpm     Mounjaro 5 mg once a week         Side effects from medications: Nausea and vomiting with Trulicity  Previous history: She had mild hyperglycemia in 2020 but had significant increase in glucose of 484 in 3/21  Non-insulin hypoglycemic drugs previously used: Jardiance since 9/21, Bydureon, Trulicity, metformin, glipizide Insulin was started in 2022  A1c range in the last few years is: 6.4-12.7  COMPLICATIONS -  MI/Stroke -  retinopathy +  neuropathy +  nephropathy  SYMPTOMS REVIEWED - Polyuria - Weight loss - Blurred vision  BLOOD SUGAR DATA  CGM interpretation: At today's visit, we reviewed her CGM downloads. The full report is scanned in the media. Reviewing the CGM trends, BG are well controlled across the day with slight peak in evening.   Physical Exam  LMP  (LMP Unknown)    Constitutional: well developed, well nourished Head: normocephalic, atraumatic Eyes: sclera anicteric, no redness Neck: supple Lungs:  normal respiratory effort Neurology: alert and oriented Skin: dry, no appreciable rashes Musculoskeletal: no appreciable defects Psychiatric: normal mood and affect Diabetic Foot Exam - Simple   No data filed      Current Medications Patient's Medications  New Prescriptions   INSULIN LISPRO (HUMALOG KWIKPEN) 100 UNIT/ML KWIKPEN    Inject 1-5 Units into the skin as needed.   TIRZEPATIDE (MOUNJARO) 7.5 MG/0.5ML PEN    Inject 7.5 mg into the skin once a week.  Previous Medications   ACETYLCYSTEINE (N-ACETYL CYSTEINE) 600 MG CAPS    Take 1 capsule by mouth daily.   ALPRAZOLAM (XANAX) 0.5 MG TABLET    Take 0.25 mg by mouth daily as needed for anxiety.   B-12 METHYLCOBALAMIN 1000 MCG TBDP    Take 1,000 mcg by mouth daily.   BLOOD GLUCOSE METER KIT AND SUPPLIES KIT    Inject 1 each into the skin daily. Check blood glucose once  a day. Diagnosis code: E 11.9.   CALCIUM CARBONATE (TUMS - DOSED IN MG ELEMENTAL CALCIUM) 500 MG CHEWABLE TABLET    Chew 1,000 mg by mouth daily as needed for indigestion or heartburn.   CHLORTHALIDONE (HYGROTON) 25 MG TABLET    Take 1/2 (one-half) tablet by mouth once daily   CONTINUOUS GLUCOSE RECEIVER (DEXCOM G7 RECEIVER) DEVI    1 each by Does not apply route continuous.   CONTINUOUS GLUCOSE SENSOR (DEXCOM G7 SENSOR) MISC    Use to check glucose continuously, change sensor every 10 days   CYCLOBENZAPRINE (FLEXERIL) 5 MG TABLET    Take 1 tablet (5 mg total) by mouth 2 (two) times daily as needed.   DULOXETINE (CYMBALTA) 60 MG CAPSULE    Take 120 mg by mouth every morning. Two capsules (120mg ) daily   EMPAGLIFLOZIN (JARDIANCE) 25 MG TABS TABLET    TAKE 1 TABLET BY MOUTH ONCE DAILY BEFORE BREAKFAST   GABAPENTIN (NEURONTIN) 300 MG CAPSULE    Take 1 capsule (300 mg total) by mouth at bedtime. X 1 week then can increase to 600 mg QHS if possible- for FMS   GLUCOSE BLOOD (ACCU-CHEK GUIDE) TEST STRIP    1 each by Other route in the morning, at noon, and at bedtime. Use as instructed   INSULIN DEGLUDEC (TRESIBA FLEXTOUCH) 200 UNIT/ML FLEXTOUCH PEN    Inject 86 Units into the skin 2 (two) times daily.   INSULIN PEN NEEDLE (PEN NEEDLES) 32G X 4 MM MISC    Check BS 2 x a day   INSULIN PEN NEEDLE (RELION PEN NEEDLES) 31G X 6 MM MISC    USE 2 PEN NEEDLEs to check blood glucose twice a day   L-METHYLFOLATE 15 MG TABS    Take 15 mg by mouth daily at 6 (six) AM.   LOSARTAN (COZAAR) 25 MG TABLET    Take 1 tablet by mouth once daily   METFORMIN (GLUCOPHAGE-XR) 500 MG 24 HR TABLET    Take 2 tablets (1,000 mg total) by mouth daily with supper.   METOPROLOL TARTRATE (LOPRESSOR) 25 MG TABLET    Take 1 tablet by mouth twice daily   MODAFINIL (PROVIGIL) 100 MG TABLET    Take 100 mg by mouth every morning.   MULTIPLE VITAMINS-MINERALS (MULTIVITAMIN WOMEN 50+) TABS    Take 1 tablet by mouth daily.   NONFORMULARY OR  COMPOUNDED ITEM    Take 4 mg by mouth at bedtime. X 2 weeks, then 8 mg nightly- for nerve pain and chronic fatigue- sent in 2 week  Rx for 4 mg and 6 months supply for 8 mg- sent to [facility]   OMEGA-3 ACID ETHYL ESTERS (LOVAZA) 1 G CAPSULE    Take 2 capsules (2 g total) by mouth 2 (two) times daily.   PANTOPRAZOLE (PROTONIX) 40 MG TABLET    Take 1 tablet (40 mg total) by mouth daily.   POTASSIUM CHLORIDE SA (KLOR-CON M) 20 MEQ TABLET    Take 2 tablets by mouth twice daily   ROSUVASTATIN (CRESTOR) 5 MG TABLET    Take 1 tablet (5 mg total) by mouth daily.   SUMATRIPTAN (IMITREX) 100 MG TABLET    Take 1 tablet (100 mg total) by mouth every 2 (two) hours as needed for migraine. May repeat in 2 hours if headache persists or recurs. Maximum 2 per day.   TRESIBA FLEXTOUCH 200 UNIT/ML FLEXTOUCH PEN    INJECT 170 UNITS SUBCUTANEOUSLY ONCE DAILY  Modified Medications   No medications on file  Discontinued Medications   EXENATIDE ER (BYDUREON BCISE) 2 MG/0.85ML AUIJ    Inject 2 mg into the skin once a week.   TIRZEPATIDE (MOUNJARO) 5 MG/0.5ML PEN    INJECT 5 MG SUBCUTANEOUSLY  ONCE A WEEK    Allergies Allergies  Allergen Reactions   Cephalexin Itching, Swelling and Other (See Comments)    Past Medical History Past Medical History:  Diagnosis Date   Anemia    Anxiety    Arrhythmia    Bipolar depression (HCC) 12/28/2019   Chronic fatigue    Chronic headaches    Depression    Diabetes mellitus without complication (HCC)    Fibromyalgia    GERD (gastroesophageal reflux disease)    High cholesterol    HLD (hyperlipidemia)    Hypertension    Insect bite    Migraine    Prediabetes    Sleep apnea    Urinary incontinence    Vasculitis (HCC)     Past Surgical History Past Surgical History:  Procedure Laterality Date   BIOPSY  09/04/2022   Procedure: BIOPSY;  Surgeon: Corbin Ade, MD;  [facility]: [facility];  Service: Endoscopy;;   CHOLECYSTECTOMY N/A 03/15/2022    Procedure: LAPAROSCOPIC CHOLECYSTECTOMY WITH INTRAOPERATIVE CHOLANGIOGRAM;  Surgeon: Lucretia Roers, MD;  Location: [facility];  Service: General;  Laterality: N/A;   CHOLECYSTECTOMY  02/03/2022   COLONOSCOPY  2017   per patient normal and needs next one in 2027 (Dr. Allena Katz)   COLONOSCOPY WITH PROPOFOL N/A 09/04/2022   Procedure: COLONOSCOPY WITH PROPOFOL;  Surgeon: Corbin Ade, MD;  [facility]: [facility];  Service: Endoscopy;  Laterality: N/A;   ESOPHAGOGASTRODUODENOSCOPY N/A 05/12/2020   Procedure: ESOPHAGOGASTRODUODENOSCOPY (EGD);  Surgeon: Corbin Ade, MD;  [facility]: [facility];  Service: Endoscopy;  Laterality: N/A;  11:15am   Joint fusion on foot Right    LIVER BIOPSY N/A 03/15/2022   Procedure: LIVER BIOPSY;  Surgeon: Lucretia Roers, MD;  Location: [facility];  Service: General;  Laterality: N/A;   MALONEY DILATION N/A 05/12/2020   Procedure: Alvy Beal;  Surgeon: Corbin Ade, MD;  Location: [facility];  Service: Endoscopy;  Laterality: N/A;   NASAL SEPTUM SURGERY     POLYPECTOMY  09/04/2022   Procedure: POLYPECTOMY;  Surgeon: Corbin Ade, MD;  Location: [facility];  Service: Endoscopy;;   SINOSCOPY      Family History family history includes Anxiety disorder in her father, maternal aunt, and paternal grandmother; Autoimmune disease in her brother; Congestive Heart  Failure in her mother; Depression in her father, maternal aunt, and paternal grandmother; Diabetes in her father, maternal grandmother, and sister; Diabetes Mellitus II in her mother; Heart attack in her mother; Hyperlipidemia in her father and mother; Hypertension in her brother, father, mother, and sister; Lupus in her paternal grandmother; Stroke in her father.  Social History Social History   Socioeconomic History   Marital status: Married    Spouse name: Not on file   Number of children: 0   Years of education: Not on file   Highest education level: Bachelor's degree (e.g., BA, AB,  BS)  Occupational History   Occupation: research associate/drug development  Tobacco Use   Smoking status: Former    Current packs/day: 0.00    Types: Cigarettes    Quit date: 12/30/1982    Years since quitting: 41.2   Smokeless tobacco: Never  Vaping Use   Vaping status: Never Used  Substance and Sexual Activity   Alcohol use: Not Currently    Alcohol/week: 0.0 standard drinks of alcohol   Drug use: Never   Sexual activity: Yes    Birth control/protection: Post-menopausal  Other Topics Concern   Not on file  Social History Narrative   Married for 3 years.Lives with husband.Bachelors in Building surveyor.   Right handed   Caffeine: 1-2 per day    Social Drivers of Health   Financial Resource Strain: Low Risk  (09/18/2023)   Overall Financial Resource Strain (CARDIA)    Difficulty of Paying Living Expenses: Not very hard  Food Insecurity: No Food Insecurity (12/16/2023)   Hunger Vital Sign    Worried About Running Out of Food in the Last Year: Never true    Ran Out of Food in the Last Year: Never true  Transportation Needs: No Transportation Needs (12/16/2023)   PRAPARE - Administrator, Civil Service (Medical): No    Lack of Transportation (Non-Medical): No  Physical Activity: Insufficiently Active (09/18/2023)   Exercise Vital Sign    Days of Exercise per Week: 1 day    Minutes of Exercise per Session: 30 min  Stress: Stress Concern Present (09/18/2023)   [employer] of Occupational Health - Occupational Stress Questionnaire    Feeling of Stress : To some extent  Social Connections: Unknown (09/18/2023)   Social Connection and Isolation Panel [NHANES]    Frequency of Communication with Friends and Family: Twice a week    Frequency of Social Gatherings with Friends and Family: Patient declined    Attends Religious Services: 1 to 4 times per year    Active Member of Clubs or Organizations: No    Attends Engineer, structural: More than 4 times per year     Marital Status: Married  Catering manager Violence: Not At Risk (12/16/2023)   Humiliation, Afraid, Rape, and Kick questionnaire    Fear of Current or Ex-Partner: No    Emotionally Abused: No    Physically Abused: No    Sexually Abused: No    Lab Results  Component Value Date   HGBA1C 6.8 (H) 03/25/2024   HGBA1C 7.2 (H) 08/11/2023   HGBA1C 7.2 (H) 04/29/2023   Lab Results  Component Value Date   CHOL 136 03/25/2024   Lab Results  Component Value Date   HDL 35 (L) 03/25/2024   Lab Results  Component Value Date   LDLCALC 67 03/25/2024   Lab Results  Component Value Date   TRIG 203 (H) 03/25/2024   Lab Results  Component Value  Date   CHOLHDL 3.9 03/25/2024   Lab Results  Component Value Date   CREATININE 0.92 03/25/2024   Lab Results  Component Value Date   GFR 57.44 (L) 08/11/2023   Lab Results  Component Value Date   MICROALBUR <0.7 04/29/2023      Component Value Date/Time   NA 141 03/25/2024 1643   K 3.6 03/25/2024 1643   CL 101 03/25/2024 1643   CO2 22 03/25/2024 1643   GLUCOSE 129 (H) 03/25/2024 1643   GLUCOSE 169 (H) 08/11/2023 1447   BUN 14 03/25/2024 1643   CREATININE 0.92 03/25/2024 1643   CREATININE 1.03 03/29/2022 1324   CALCIUM 9.5 03/25/2024 1643   PROT 6.4 03/25/2024 1643   ALBUMIN 4.1 03/25/2024 1643   AST 38 03/25/2024 1643   ALT 32 03/25/2024 1643   ALKPHOS 110 03/25/2024 1643   BILITOT 0.4 03/25/2024 1643   GFRNONAA 54 (L) 03/15/2022 0731   GFRNONAA 77 03/19/2021 1457   GFRAA 89 03/19/2021 1457      Latest Ref Rng & Units 03/25/2024    4:43 PM 11/24/2023    3:38 PM 09/19/2023   10:49 AM  BMP  Glucose 70 - 99 mg/dL 161  98  096   BUN 6 - 24 mg/dL 14  15  20    Creatinine 0.57 - 1.00 mg/dL 0.45  4.09  8.11   BUN/Creat Ratio 9 - 23 15  14  19    Sodium 134 - 144 mmol/L 141  141  142   Potassium 3.5 - 5.2 mmol/L 3.6  4.5  4.3   Chloride 96 - 106 mmol/L 101  100  102   CO2 20 - 29 mmol/L 22  23  19    Calcium 8.7 - 10.2 mg/dL  9.5  9.8  9.4        Component Value Date/Time   WBC 10.9 (H) 11/24/2023 1538   WBC 11.1 (H) 03/29/2022 1324   RBC 5.39 (H) 11/24/2023 1538   RBC 4.65 03/29/2022 1324   HGB 14.1 11/24/2023 1538   HCT 45.3 11/24/2023 1538   PLT 389 11/24/2023 1538   MCV 84 11/24/2023 1538   MCH 26.2 (L) 11/24/2023 1538   MCH 29.0 03/29/2022 1324   MCHC 31.1 (L) 11/24/2023 1538   MCHC 32.5 03/29/2022 1324   RDW 15.8 (H) 11/24/2023 1538   LYMPHSABS 2.3 11/24/2023 1538   MONOABS 0.9 06/03/2020 1707   EOSABS 0.4 11/24/2023 1538   BASOSABS 0.1 11/24/2023 1538     Parts of this note may have been dictated using voice recognition software. There may be variances in spelling and vocabulary which are unintentional. Not all errors are proofread. Please notify the Thereasa Parkin if any discrepancies are noted or if the meaning of any statement is not clear.

## 2024-04-01 NOTE — Patient Instructions (Addendum)
 Will recommend the following: Jardiance 25 mg daily, metformin ER 1 g at dinner Tresiba 86 units qam, 84 units qpm     Mounjaro 7.5 mg once a week Disucced S/E, no current contraindication  Humalog correction scale: Use in addition to your meal time/short acting insulin based on blood sugars as follows:  201 - 250: 2 units 251 - 300: 3 units 301 - 350: 4 units 351 - 400: 5 units  Nuts like almonds, walnuts, pistachios, cashews, and peanuts are known to have heart-healthy benefits that can help increase HDL (good) cholesterol and lower LDL (bad) cholesterol.  Here's a more detailed look at some of the nuts that can contribute to better cholesterol levels: Almonds: Studies show that almonds can improve blood cholesterol levels, potentially increasing HDL and reducing LDL.  Walnuts: Walnuts are rich in omega-3 fatty acids, which have been linked to heart health benefits, including potentially increasing HDL levels.  Pistachios: Pistachios have been shown to reduce LDL cholesterol and increase HDL cholesterol.  Cashews: Cashews contain monounsaturated fats that can help lower LDL and increase HDL.  Peanuts: Peanuts contain phytosterols that may help reduce cholesterol absorption.  Hazelnuts: Hazelnuts are a good source of polyunsaturated and monounsaturated fats that promote heart health, potentially increasing HDL.  Macadamia Nuts: These nuts are abundant in monounsaturated fats, which can help lower LDL cholesterol and triglycerides while boosting HDL.  [place] Nuts: [place] nuts are a good source of mono-unsaturated fatty acids that can help lower LDL and increase HDL ___________   Goals of DM therapy:  Morning Fasting blood sugar: 80-140  Blood sugar before meals: 80-140 Bed time blood sugar: 100-150  A1C <7%, limited only by hypoglycemia  1.Diabetes medications and their side effects discussed, including hypoglycemia    2. Check blood glucose:  a) Always check blood sugars  before driving. Please see below (under hypoglycemia) on how to manage b) Check a minimum of 3 times/day or more as needed when having symptoms of hypoglycemia.   c) Try to check blood glucose before sleeping/in the middle of the night to ensure that it is remaining stable and not dropping less than 100 d) Check blood glucose more often if sick  3. Diet: a) 3 meals per day schedule b: Restrict carbs to 60-70 grams (4 servings) per meal c) Colorful vegetables - 3 servings a day, and low sugar fruit 2 servings/day Plate control method: 1/4 plate protein, 1/4 starch, 1/2 green, yellow, or red vegetables d) Avoid carbohydrate snacks unless hypoglycemic episode, or increased physical activity  4. Regular exercise as tolerated, preferably 3 or more hours a week  5. Hypoglycemia: a)  Do not drive or operate machinery without first testing blood glucose to assure it is over 90 mg%, or if dizzy, lightheaded, not feeling normal, etc, or  if foot or leg is numb or weak. b)  If blood glucose less than 70, take four 5gm Glucose tabs or 15-30 gm Glucose gel.  Repeat every 15 min as needed until blood sugar is >100 mg/dl. If hypoglycemia persists then call 911.   6. Sick day management: a) Check blood glucose more often b) Continue usual therapy if blood sugars are elevated.   7. Contact the doctor immediately if blood glucose is frequently <60 mg/dl, or an episode of severe hypoglycemia occurs (where someone had to give you glucose/  glucagon or if you passed out from a low blood glucose), or if blood glucose is persistently >350 mg/dl, for further management  8.  A change in level of physical activity or exercise and a change in diet may also affect your blood sugar. Check blood sugars more often and call if needed.  Instructions: 1. Bring glucose meter, blood glucose records on every visit for review 2. Continue to follow up with primary care physician and other providers for medical care 3.  Yearly eye  and foot exam 4. Please get blood work done prior to the next appointment

## 2024-04-06 ENCOUNTER — Encounter: Payer: Self-pay | Admitting: Hematology

## 2024-04-14 ENCOUNTER — Encounter: Payer: Self-pay | Admitting: Internal Medicine

## 2024-04-20 ENCOUNTER — Other Ambulatory Visit: Payer: Self-pay | Admitting: Internal Medicine

## 2024-04-20 ENCOUNTER — Encounter: Payer: Self-pay | Admitting: Internal Medicine

## 2024-04-20 DIAGNOSIS — M7989 Other specified soft tissue disorders: Secondary | ICD-10-CM

## 2024-04-21 ENCOUNTER — Other Ambulatory Visit: Payer: Self-pay

## 2024-04-21 DIAGNOSIS — E782 Mixed hyperlipidemia: Secondary | ICD-10-CM

## 2024-04-21 DIAGNOSIS — M7989 Other specified soft tissue disorders: Secondary | ICD-10-CM

## 2024-04-21 MED ORDER — CHLORTHALIDONE 25 MG PO TABS: 25.0000 mg | ORAL_TABLET | Freq: Every day | ORAL | 0 refills | Status: DC

## 2024-04-21 MED ORDER — ROSUVASTATIN CALCIUM 5 MG PO TABS: 5.0000 mg | ORAL_TABLET | Freq: Every day | ORAL | 1 refills | Status: DC

## 2024-04-28 ENCOUNTER — Encounter: Payer: Self-pay | Admitting: Hematology

## 2024-05-05 ENCOUNTER — Encounter: Payer: Self-pay | Admitting: Hematology

## 2024-05-05 ENCOUNTER — Encounter: Payer: Self-pay | Admitting: Neurology

## 2024-05-05 ENCOUNTER — Ambulatory Visit: Admitting: Neurology

## 2024-05-05 VITALS — BP 119/82 | HR 91 | Ht 69.0 in | Wt 267.2 lb

## 2024-05-05 DIAGNOSIS — G43109 Migraine with aura, not intractable, without status migrainosus: Secondary | ICD-10-CM

## 2024-05-05 MED ORDER — TOPIRAMATE 25 MG PO TABS: 25.0000 mg | ORAL_TABLET | Freq: Every day | ORAL | 6 refills | Status: AC

## 2024-05-05 NOTE — Patient Instructions (Addendum)
 Acutely: Imitrex  at onset and then can repeat in 2 hours. Other triptans include rizatriptan, eletriptan and we have several of them used to treat migraines as needed. Other options include ubrelvy or Nurtec.   Prevention: Topiramate is a medication commonly used. Start at a low dose and then increase as needed. Or Newer medications include Ajovy, Emgality(both injections) and qulipta(pill) and vyepti(every 3 month infusion). Other options include botox for migraines. There are other medications as well.   Discussed:  There is increased risk for stroke in women with migraine with aura and a contraindication for the combined contraceptive pill for use by women who have migraine with aura. The risk for women with migraine without aura is lower. However other risk factors like smoking are far more likely to increase stroke risk than migraine. There is a recommendation for no smoking and for the use of OCPs without estrogen such as progestogen only pills particularly for women with migraine with aura.Aaron Aas People who have migraine headaches with auras may be 3 times more likely to have a stroke caused by a blood clot, compared to migraine patients who don't see auras. Women who take hormone-replacement therapy may be 30 percent more likely to suffer a clot-based stroke than women not taking medication containing estrogen. Other risk factors like smoking and high blood pressure may be  much more important. And stroke is still a rare complication due to migraine aura and is controversial and lower doses may not cause a risk.  Topiramate Tablets What is this medication? TOPIRAMATE (toe PYRE a mate) prevents and controls seizures in people with epilepsy. It may also be used to prevent migraine headaches. It works by calming overactive nerves in your body. This medicine may be used for other purposes; ask your health care provider or pharmacist if you have questions. COMMON BRAND NAME(S): Topamax, Topiragen What  should I tell my care team before I take this medication? They need to know if you have any of these conditions: Bleeding disorder Kidney disease Lung disease Suicidal thoughts, plans, or attempt by you or a family member An unusual or allergic reaction to topiramate, other medications, foods, dyes, or preservatives Pregnant or trying to get pregnant Breast-feeding How should I use this medication? Take this medication by mouth with water. Take it as directed on the prescription label at the same time every day. Do not cut, crush or chew this medicine. Swallow the tablets whole. You can take it with or without food. If it upsets your stomach, take it with food. Keep taking it unless your care team tells you to stop. A special MedGuide will be given to you by the pharmacist with each prescription and refill. Be sure to read this information carefully each time. Talk to your care team about the use of this medication in children. While it may be prescribed for children as young as 2 years for selected conditions, precautions do apply. Overdosage: If you think you have taken too much of this medicine contact a poison control center or emergency room at once. NOTE: This medicine is only for you. Do not share this medicine with others. What if I miss a dose? If you miss a dose, take it as soon as you can unless it is within 6 hours of the next dose. If it is within 6 hours of the next dose, skip the missed dose. Take the next dose at the normal time. Do not take double or extra doses. What may interact with this medication?  Acetazolamide Alcohol Antihistamines for allergy, cough, and cold Aspirin  and aspirin -like medications Atropine Certain medications for anxiety or sleep Certain medications for bladder problems, such as oxybutynin, tolterodine Certain medications for depression, such as amitriptyline , fluoxetine, sertraline Certain medications for Parkinson disease, such as benztropine,  trihexyphenidyl Certain medications for seizures, such as carbamazepine, lamotrigine , phenobarbital, phenytoin, primidone, valproic acid, zonisamide Certain medications for stomach problems, such as dicyclomine , hyoscyamine Certain medications for travel sickness, such as scopolamine  Certain medications that treat or prevent blood clots, such as warfarin, enoxaparin , dalteparin, apixaban, dabigatran, rivaroxaban Digoxin Diltiazem Estrogen and progestin hormones General anesthetics, such as halothane, isoflurane, methoxyflurane, propofol  Glyburide Hydrochlorothiazide Ipratropium Lithium Medications that relax muscles Metformin  NSAIDs, medications for pain and inflammation, such as ibuprofen  or naproxen Opioid medications for pain Phenothiazines, such as chlorpromazine, mesoridazine, prochlorperazine, thioridazine Pioglitazone This list may not describe all possible interactions. Give your health care provider a list of all the medicines, herbs, non-prescription drugs, or dietary supplements you use. Also tell them if you smoke, drink alcohol, or use illegal drugs. Some items may interact with your medicine. What should I watch for while using this medication? Visit your care team for regular checks on your progress. Tell your care team if your symptoms do not start to get better or if they get worse. Do not suddenly stop taking this medication. You may develop a severe reaction. Your care team will tell you how much medication to take. If your care team wants you to stop the medication, the dose may be slowly lowered over time to avoid any side effects. Wear a medical ID bracelet or chain. Carry a card that describes your condition. List the medications and doses you take on the card. This medication may affect your coordination, reaction time, or judgment. Do not drive or operate machinery until you know how this medication affects you. Sit up or stand slowly to reduce the risk of dizzy or  fainting spells. Drinking alcohol with this medication can increase the risk of these side effects. This medication may cause serious skin reactions. They can happen weeks to months after starting the medication. Contact your care team right away if you notice fevers or flu-like symptoms with a rash. The rash may be red or purple and then turn into blisters or peeling of the skin. You may also notice a red rash with swelling of the face, lips, or lymph nodes in your neck or under your arms. This medication may cause thoughts of suicide or depression. This includes sudden changes in mood, behaviors, or thoughts. These changes can happen at any time but are more common in the beginning of treatment or after a change in dose. Call your care team right away if you experience these thoughts or worsening depression. This medication may slow your child's growth if it is taken for a long time at high doses. Your child's care team will monitor your child's growth. Using this medication for a long time may weaken your bones. The risk of bone fractures may be increased. Talk to your care team about your bone health. Discuss this medication with your care team if you may be pregnant. Serious birth defects can occur if you take this medication during pregnancy. There are benefits and risks to taking medications during pregnancy. Your care team can help you find the option that works for you. Contraception is recommended while taking this medication. Estrogen and progestin hormones may not work as well while you are taking this medication. Your care team  can help you find the option that works for you. Talk to your care team before breastfeeding. Changes to your treatment plan may be needed. What side effects may I notice from receiving this medication? Side effects that you should report to your care team as soon as possible: Allergic reactions--skin rash, itching, hives, swelling of the face, lips, tongue, or  throat High acid level--trouble breathing, unusual weakness or fatigue, confusion, headache, fast or irregular heartbeat, nausea, vomiting High ammonia level--unusual weakness or fatigue, confusion, loss of appetite, nausea, vomiting, seizures Fever that does not go away, decrease in sweat Kidney stones--blood in the urine, pain or trouble passing urine, pain in the lower back or sides Redness, blistering, peeling or loosening of the skin, including inside the mouth Sudden eye pain or change in vision such as blurry vision, seeing halos around lights, vision loss Thoughts of suicide or self-harm, worsening mood, feelings of depression Side effects that usually do not require medical attention (report to your care team if they continue or are bothersome): Burning or tingling sensation in hands or feet Difficulty with paying attention, memory, or speech Dizziness Drowsiness Fatigue Loss of appetite with weight loss Slow or sluggish movements of the body This list may not describe all possible side effects. Call your doctor for medical advice about side effects. You may report side effects to FDA at 1-800-FDA-1088. Where should I keep my medication? Keep out of the reach of children and pets. Store between 15 and 30 degrees C (59 and 86 degrees F). Protect from moisture. Keep the container tightly closed. Get rid of any unused medication after the expiration date. To get rid of medications that are no longer needed or have expired: Take the medication to a medication take-back program. Check with your pharmacy or law enforcement to find a location. If you cannot return the medication, check the label or package insert to see if the medication should be thrown out in the garbage or flushed down the toilet. If you are not sure, ask your care team. If it is safe to put it in the trash, empty the medication out of the container. Mix the medication with cat litter, dirt, coffee grounds, or other  unwanted substance. Seal the mixture in a bag or container. Put it in the trash. NOTE: This sheet is a summary. It may not cover all possible information. If you have questions about this medicine, talk to your doctor, pharmacist, or health care provider.  2024 [employer] Standard (2022-05-09 00:00:00)

## 2024-05-05 NOTE — Progress Notes (Signed)
 [facility]   Provider:  Dr Tresia Fruit Requesting Provider: Meldon Sport, MD Primary Care Provider:  Meldon Sport, MD  CC:  Migraines   05/05/2024: Seen patient in the past for unsteadiness in 2021 (ongoing since about 2020) with unremarkable MRI brain, we ordered an MRI cervical spine at the time (doesn't appear it was completed)and she went to PT with instructions to come back as needed. There was a large component of global pain Pain "pretty much constantly" with non focal and multiple other disorders. She has uncontrolled diabetes per notes from endocrinology Dr. Vertell Gory and diabetic polyneuropathy, 04/01/2024 also has Hyperlipidemia; Morbid obesity (HCC); Tachycardia; Bipolar depression (HCC); OSA on CPAP; Type 2 diabetes mellitus (HCC); GERD (gastroesophageal reflux disease); Major depressive disorder, recurrent (HCC); Diverticulosis of colon without hemorrhage; Fibromyalgia; Elevated C-reactive protein (CRP); Perennial allergic rhinitis; Allergic contact dermatitis; Urticaria; Bilateral primary osteoarthritis of knee; Diabetic polyneuropathy associated with type 2 diabetes mellitus (HCC); History of hormone therapy; Chronic migraine with aura; Chest pain; Encounter for general adult medical examination with abnormal findings; Plantar fasciitis; Fatty liver; Essential hypertension; Rash and nonspecific skin eruption; Diarrhea; Cutaneous vasculitis; Chronic fatigue; Myofascial pain dysfunction syndrome; Acute right-sided thoracic back pain; Abnormal EKG; Epigastric pain determined by examination; Hypersomnolence; Recurrent sinusitis; Iron  deficiency; and URTI (acute upper respiratory infection) on their problem list.  She states she wants to discuss migraines today. Started at the age of 3. She will get a wavy line when she gets migraines. In both eyes. They last hours to 24 -36 hours. The visual part is is 30 minutes then starts the headache. Worsening this year. The migraines are  pounding/throbbing, she gets light sensitivity, sound sensitivity, a dark cool room helps, they are on both sides of the head more frontal, nausea, no vomiting, more frequent, 15 total headache days a month and 4 days a month of moderate to severe migraines. The other headaches are more tension type, mild to moderate. No changes in quality, no vision changes, not exertional or positional. Mother with migraine with aura.   Medications tried that can be used in migraine/headache management greater than 3 months include: Lifestyle modification, headache diaries, better sleep hygiene, exercise, management of migraine triggers. Various analgesics/nsaids. Metoprolol , flexeril , cymbalta, lexapro , neurontin , lamictal , losartan , lisinopril, lyrica, sertraline, imitrex , trazodone . Aimovig contraindicated due to constipation.   EXAM: MRI HEAD WITHOUT CONTRAST   TECHNIQUE: Multiplanar, multiecho pulse sequences of the brain and surrounding structures were obtained without intravenous contrast.   COMPARISON:  None.   FINDINGS: Brain: No acute infarction, hemorrhage, hydrocephalus, extra-axial collection or mass lesion. Mild patchy hyperintensity in the pons. Few small white matter hyperintensities bilaterally.   Vascular: Normal arterial flow voids   Skull and upper cervical spine: No focal skeletal abnormality.   Sinuses/Orbits: Paranasal sinuses clear. Mastoid clear. Normal orbit   Other: None   IMPRESSION: Negative MRI head no acute abnormality   Mild hyperintensity in the pons and in the white matter which is likely due to mild chronic microvascular ischemia.  No other focal neurologic deficits, associated symptoms, inciting events or modifiable factors. Patient complains of symptoms per HPI as well as the following symptoms: none . Pertinent negatives and positives per HPI. All others negative  HPI 12/13/2020:  Sydney Taylor is a 58 y.o. female here as requested by Meldon Sport, MD  for dizziness. PMHx sleep apnea, prediabetes, migraine, HTN, HLD, Fibromyalgia, depression, bipolar disease, anxiety, anemia. Patient says the main problem she has is unsteadiness in er legs,  better if walking fast, no falls, no weakness, been ongoing "a while" maybe a year, she feels she has to concentrate to go ina straight line, Uncle with parkinson's, no tremors, no stiffness, she has Pain "pretty much constantly" but no neck pain or shooting pain in the arms or legs, she has not been dropping things, No significant numbness or tingling, no tremor, no falls. Episodes are random, she does not feel it today. Mostly if she Is walking slowly, walking fast is better. No problems walking. Not lightheaded. No other focal neurologic deficits, associated symptoms, inciting events or modifiable factors.  Reviewed notes, labs and imaging from outside physicians, which showed  B12 300, cbc/bmp unremarkable, ana negative, RF neg, crp normal, sed rate normal, tsh   MRI of the brain wo contrast 08/05/2020: personally reviewed images and agree with findings below  IMPRESSION: Negative MRI head no acute abnormality   Mild hyperintensity in the pons and in the white matter which is likely due to mild chronic microvascular ischemia.  Review of Systems: Patient complains of symptoms per HPI as well as the following symptoms: dizziness, insomnia, headache, feeling hot, joint pain, aching muscles, fatigue, blurriness. Pertinent negatives and positives per HPI. All others negative.   Social History   Socioeconomic History   Marital status: Married    Spouse name: Not on file   Number of children: 0   Years of education: Not on file   Highest education level: Bachelor's degree (e.g., BA, AB, BS)  Occupational History   Occupation: research associate/drug development  Tobacco Use   Smoking status: Former    Current packs/day: 0.00    Types: Cigarettes    Quit date: 12/30/1982    Years since quitting: 41.3    Smokeless tobacco: Never  Vaping Use   Vaping status: Never Used  Substance and Sexual Activity   Alcohol use: Not Currently    Alcohol/week: 0.0 standard drinks of alcohol   Drug use: Never   Sexual activity: Yes    Birth control/protection: Post-menopausal  Other Topics Concern   Not on file  Social History Narrative   Married for 3 years.Lives with husband.Bachelors in Building surveyor.   Right handed   Caffeine: 1-2 per day    Social Drivers of Health   Financial Resource Strain: Low Risk  (09/18/2023)   Overall Financial Resource Strain (CARDIA)    Difficulty of Paying Living Expenses: Not very hard  Food Insecurity: No Food Insecurity (12/16/2023)   Hunger Vital Sign    Worried About Running Out of Food in the Last Year: Never true    Ran Out of Food in the Last Year: Never true  Transportation Needs: No Transportation Needs (12/16/2023)   PRAPARE - Administrator, Civil Service (Medical): No    Lack of Transportation (Non-Medical): No  Physical Activity: Insufficiently Active (09/18/2023)   Exercise Vital Sign    Days of Exercise per Week: 1 day    Minutes of Exercise per Session: 30 min  Stress: Stress Concern Present (09/18/2023)   [employer] of Occupational Health - Occupational Stress Questionnaire    Feeling of Stress : To some extent  Social Connections: Unknown (09/18/2023)   Social Connection and Isolation Panel [NHANES]    Frequency of Communication with Friends and Family: Twice a week    Frequency of Social Gatherings with Friends and Family: Patient declined    Attends Religious Services: 1 to 4 times per year    Active Member of Clubs or  Organizations: No    Attends Engineer, structural: More than 4 times per year    Marital Status: Married  Catering manager Violence: Not At Risk (12/16/2023)   Humiliation, Afraid, Rape, and Kick questionnaire    Fear of Current or Ex-Partner: No    Emotionally Abused: No    Physically Abused: No     Sexually Abused: No    Family History  Problem Relation Age of Onset   Hypertension Father    Hyperlipidemia Father    Diabetes Father    Stroke Father    Depression Father    Anxiety disorder Father    Hypertension Mother    Hyperlipidemia Mother    Diabetes Mellitus II Mother    Congestive Heart Failure Mother    Heart attack Mother    Hypertension Sister    Diabetes Sister    Hypertension Brother    Autoimmune disease Brother    Diabetes Maternal Grandmother    Lupus Paternal Grandmother    Depression Paternal Grandmother    Anxiety disorder Paternal Grandmother    Depression Maternal Aunt    Anxiety disorder Maternal Aunt    Colon cancer Neg Hx    Esophageal cancer Neg Hx    Gastric cancer Neg Hx     Past Medical History:  Diagnosis Date   Anemia    Anxiety    Arrhythmia    Bipolar depression (HCC) 12/28/2019   Chronic fatigue    Chronic headaches    Depression    Diabetes mellitus without complication (HCC)    Fibromyalgia    GERD (gastroesophageal reflux disease)    High cholesterol    HLD (hyperlipidemia)    Hypertension    Insect bite    Migraine    Prediabetes    Sleep apnea    Urinary incontinence    Vasculitis (HCC)     Patient Active Problem List   Diagnosis Date Noted   URTI (acute upper respiratory infection) 01/28/2024   Iron  deficiency 11/26/2023   Hypersomnolence 05/28/2023   Recurrent sinusitis 03/17/2023   Abnormal EKG 02/10/2023   Epigastric pain determined by examination 02/10/2023   Acute right-sided thoracic back pain 01/30/2023   Myofascial pain dysfunction syndrome 01/27/2023   Chronic fatigue 10/07/2022   Cutaneous vasculitis 09/23/2022   Diarrhea 07/30/2022   Rash and nonspecific skin eruption 06/07/2022   Essential hypertension 06/03/2022   Fatty liver 03/05/2022   Encounter for general adult medical examination with abnormal findings 02/01/2022   Plantar fasciitis 02/01/2022   Chest pain 01/14/2022   Chronic  migraine with aura 10/29/2021   Diabetic polyneuropathy associated with type 2 diabetes mellitus (HCC) 09/19/2021   History of hormone therapy 09/19/2021   Bilateral primary osteoarthritis of knee 04/10/2021   Perennial allergic rhinitis 04/06/2021   Allergic contact dermatitis 04/06/2021   Urticaria 04/06/2021   Fibromyalgia 01/12/2021   Elevated C-reactive protein (CRP) 01/12/2021   Diverticulosis of colon without hemorrhage 01/02/2021   Major depressive disorder, recurrent (HCC) 07/14/2020   GERD (gastroesophageal reflux disease) 05/11/2020   Type 2 diabetes mellitus (HCC) 03/27/2020   Bipolar depression (HCC) 12/28/2019   OSA on CPAP 12/28/2019   Tachycardia 10/31/2019   Hyperlipidemia 10/30/2015   Morbid obesity (HCC) 10/30/2015    Past Surgical History:  Procedure Laterality Date   BIOPSY  09/04/2022   Procedure: BIOPSY;  Surgeon: Suzette Espy, MD;  Location: [facility];  Service: Endoscopy;;   CHOLECYSTECTOMY N/A 03/15/2022   Procedure: LAPAROSCOPIC CHOLECYSTECTOMY WITH INTRAOPERATIVE  CHOLANGIOGRAM;  Surgeon: Awilda Bogus, MD;  Location: [facility];  Service: General;  Laterality: N/A;   CHOLECYSTECTOMY  02/03/2022   COLONOSCOPY  2017   per patient normal and needs next one in 2027 (Dr. Lydia Sams)   COLONOSCOPY WITH PROPOFOL  N/A 09/04/2022   Procedure: COLONOSCOPY WITH PROPOFOL ;  Surgeon: Suzette Espy, MD;  Location: [facility];  Service: Endoscopy;  Laterality: N/A;   ESOPHAGOGASTRODUODENOSCOPY N/A 05/12/2020   Procedure: ESOPHAGOGASTRODUODENOSCOPY (EGD);  Surgeon: Suzette Espy, MD;  Location: [facility];  Service: Endoscopy;  Laterality: N/A;  11:15am   Joint fusion on foot Right    LIVER BIOPSY N/A 03/15/2022   Procedure: LIVER BIOPSY;  Surgeon: Awilda Bogus, MD;  Location: [facility];  Service: General;  Laterality: N/A;   MALONEY DILATION N/A 05/12/2020   Procedure: Dorothyann Gather;  Surgeon: Suzette Espy, MD;  Location: [facility];  Service:  Endoscopy;  Laterality: N/A;   NASAL SEPTUM SURGERY     POLYPECTOMY  09/04/2022   Procedure: POLYPECTOMY;  Surgeon: Suzette Espy, MD;  Location: [facility];  Service: Endoscopy;;   SINOSCOPY      Current Outpatient Medications  Medication Sig Dispense Refill   ALPRAZolam (XANAX) 0.5 MG tablet Take 0.25 mg by mouth daily as needed for anxiety.     B-12 Methylcobalamin 1000 MCG TBDP Take 1,000 mcg by mouth daily.     blood glucose meter kit and supplies KIT Inject 1 each into the skin daily. Check blood glucose once a day. Diagnosis code: E 11.9. 1 each 0   calcium  carbonate (TUMS - DOSED IN MG ELEMENTAL CALCIUM ) 500 MG chewable tablet Chew 1,000 mg by mouth daily as needed for indigestion or heartburn.     chlorthalidone  (HYGROTON ) 25 MG tablet Take 1 tablet (25 mg total) by mouth daily. 45 tablet 0   Continuous Glucose Receiver (DEXCOM G7 RECEIVER) DEVI 1 each by Does not apply route continuous.     Continuous Glucose Sensor (DEXCOM G7 SENSOR) MISC Use to check glucose continuously, change sensor every 10 days 9 each 3   DULoxetine (CYMBALTA) 60 MG capsule Take 120 mg by mouth every morning. Two capsules (120mg ) daily     empagliflozin  (JARDIANCE ) 25 MG TABS tablet TAKE 1 TABLET BY MOUTH ONCE DAILY BEFORE BREAKFAST 30 tablet 2   gabapentin  (NEURONTIN ) 300 MG capsule Take 1 capsule (300 mg total) by mouth at bedtime. X 1 week then can increase to 600 mg QHS if possible- for FMS 60 capsule 5   glucose blood (ACCU-CHEK GUIDE) test strip 1 each by Other route in the morning, at noon, and at bedtime. Use as instructed 300 each 3   Insulin  Degludec FlexTouch 200 UNIT/ML SOPN 86 units qam, 84 units qpm 81 mL 3   insulin  lispro (HUMALOG  KWIKPEN) 100 UNIT/ML KwikPen Inject 1-5 Units into the skin as needed. 15 mL 3   Insulin  Pen Needle (PEN NEEDLES) 32G X 4 MM MISC Check BS 2 x a day 200 each 5   Insulin  Pen Needle (RELION PEN NEEDLES) 31G X 6 MM MISC USE 2 PEN NEEDLEs to check blood glucose  twice a day 50 each 5   L-Methylfolate 15 MG TABS Take 15 mg by mouth daily at 6 (six) AM.     losartan  (COZAAR ) 25 MG tablet Take 1 tablet by mouth once daily 90 tablet 0   metFORMIN  (GLUCOPHAGE -XR) 500 MG 24 hr tablet Take 2 tablets (1,000 mg total) by mouth  daily with supper. 180 tablet 1   metoprolol  tartrate (LOPRESSOR ) 25 MG tablet Take 1 tablet by mouth twice daily 180 tablet 2   modafinil (PROVIGIL) 100 MG tablet Take 100 mg by mouth every morning.     Multiple Vitamins-Minerals (MULTIVITAMIN WOMEN 50+) TABS Take 1 tablet by mouth daily.     NONFORMULARY OR COMPOUNDED ITEM Take 4 mg by mouth at bedtime. X 2 weeks, then 8 mg nightly- for nerve pain and chronic fatigue- sent in 2 week Rx for 4 mg and 6 months supply for 8 mg- sent to [facility] (Patient taking differently: Take 8 mg by mouth at bedtime. Naltrexone 8mg )     pantoprazole  (PROTONIX ) 40 MG tablet Take 1 tablet (40 mg total) by mouth daily. 90 tablet 1   potassium chloride  SA (KLOR-CON  M) 20 MEQ tablet Take 2 tablets by mouth twice daily 360 tablet 0   rosuvastatin  (CRESTOR ) 5 MG tablet Take 1 tablet (5 mg total) by mouth daily. 90 tablet 1   SUMAtriptan  (IMITREX ) 100 MG tablet Take 1 tablet (100 mg total) by mouth every 2 (two) hours as needed for migraine. May repeat in 2 hours if headache persists or recurs. Maximum 2 per day. 10 tablet 2   topiramate  (TOPAMAX ) 25 MG tablet Take 1 tablet (25 mg total) by mouth at bedtime. 30 tablet 6   TRESIBA  FLEXTOUCH 200 UNIT/ML FlexTouch Pen INJECT 170 UNITS SUBCUTANEOUSLY ONCE DAILY 18 mL 0   Acetylcysteine (N-ACETYL CYSTEINE) 600 MG CAPS Take 1 capsule by mouth daily. (Patient not taking: Reported on 05/05/2024)     cyclobenzaprine  (FLEXERIL ) 5 MG tablet Take 1 tablet (5 mg total) by mouth 2 (two) times daily as needed. (Patient not taking: Reported on 05/05/2024) 30 tablet 1   insulin  degludec (TRESIBA  FLEXTOUCH) 200 UNIT/ML FlexTouch Pen Inject 86 Units into the skin 2 (two) times  daily. (Patient not taking: Reported on 05/05/2024) 81 mL 1   MOUNJARO 7.5 MG/0.5ML Pen INJECT 7.5 MG INTO THE SKIN ONCE A WEEK 4 mL 0   omega-3 acid ethyl esters (LOVAZA ) 1 g capsule Take 2 capsules (2 g total) by mouth 2 (two) times daily. (Patient not taking: Reported on 05/05/2024) 120 capsule 5   No current facility-administered medications for this visit.    Allergies as of 05/05/2024 - Review Complete 05/05/2024  Allergen Reaction Noted   Cephalexin  Itching, Swelling, and Other (See Comments) 05/02/2020    Vitals: BP 119/82   Pulse 91   Ht 5\' 9"  (1.753 m)   Wt 267 lb 3.2 oz (121.2 kg)   LMP  (LMP Unknown)   BMI 39.46 kg/m  Last Weight:  Wt Readings from Last 1 Encounters:  05/05/24 267 lb 3.2 oz (121.2 kg)   Last Height:   Ht Readings from Last 1 Encounters:  05/05/24 5\' 9"  (1.753 m)     Physical exam: Exam: Gen: NAD, conversant      CV: No palpitations or chest pain or SOB. VS: Breathing at a normal rate. obese. Not febrile. Eyes: Conjunctivae clear without exudates or hemorrhage  Neuro: Detailed Neurologic Exam  Speech:    Speech is normal; fluent and spontaneous with normal comprehension.  Cognition:    The patient is oriented to person, place, and time;     recent and remote memory intact;     language fluent;     normal attention, concentration, fund of knowledge Cranial Nerves:    The pupils are equal, round, and reactive to light.  Visual fields are full Extraocular movements are intact.  The face is symmetric with normal sensation. The palate elevates in the midline. Hearing intact. Voice is normal. Shoulder shrug is normal. The tongue has normal motion without fasciculations.   Coordination: normal  Gait:    No abnormalities noted or reported  Motor Observation:   no involuntary movements noted. Tone:    Appears normal  Posture:    Posture is normal. normal erect    Strength:    Strength is anti-gravity and symmetric in the upper and lower  limbs.      Sensation: intact to LT, no reports of numbness or tingling or paresthesias        Assessment/Plan:  Patient here for migraines. Seen patient in the past for unsteadiness in 2021 (ongoing since about 2020) with unremarkable MRI brain, we ordered an MRI cervical spine at the time (doesn't appear it was completed)and she went to PT with instructions to come back as needed. There was a large component of global pain Pain "pretty much constantly" with non focal and multiple other disorders. She has uncontrolled diabetes per notes from endocrinology Dr. Vertell Gory and diabetic polyneuropathy, 04/01/2024 also has Hyperlipidemia; Morbid obesity (HCC); Tachycardia; Bipolar depression (HCC); OSA on CPAP; Type 2 diabetes mellitus (HCC); GERD (gastroesophageal reflux disease); Major depressive disorder, recurrent (HCC); Diverticulosis of colon without hemorrhage; Fibromyalgia; Elevated C-reactive protein (CRP); Perennial allergic rhinitis; Allergic contact dermatitis; Urticaria; Bilateral primary osteoarthritis of knee; Diabetic polyneuropathy associated with type 2 diabetes mellitus (HCC); History of hormone therapy; Chronic migraine with aura; Chest pain; Encounter for general adult medical examination with abnormal findings; Plantar fasciitis; Fatty liver; Essential hypertension; Rash and nonspecific skin eruption; Diarrhea; Cutaneous vasculitis; Chronic fatigue; Myofascial pain dysfunction syndrome; Acute right-sided thoracic back pain; Abnormal EKG; Epigastric pain determined by examination; Hypersomnolence; Recurrent sinusitis; Iron  deficiency; and URTI (acute upper respiratory infection) on their problem list.  Here for migraines today: has migraine with aura  Acutely: Imitrex  at onset and then can repeat in 2 hours. Other triptans include rizatriptan, eletriptan and we have several of them used to treat migraines as needed. Other options include ubrelvy or Nurtec.   Prevention: Topiramate  is a  medication commonly used. Start at a low dose and then increase as needed. Or Newer medications include Ajovy, Emgality(both injections) and qulipta(pill) and vyepti(every 3 month infusion). Other options include botox for migraines. There are other medications as well.   Discussed:  There is increased risk for stroke in women with migraine with aura and a contraindication for the combined contraceptive pill for use by women who have migraine with aura. The risk for women with migraine without aura is lower. However other risk factors like smoking are far more likely to increase stroke risk than migraine. There is a recommendation for no smoking and for the use of OCPs without estrogen such as progestogen only pills particularly for women with migraine with aura.Aaron Aas People who have migraine headaches with auras may be 3 times more likely to have a stroke caused by a blood clot, compared to migraine patients who don't see auras. Women who take hormone-replacement therapy may be 30 percent more likely to suffer a clot-based stroke than women not taking medication containing estrogen. Other risk factors like smoking and high blood pressure may be  much more important. And stroke is still a rare complication due to migraine aura and is controversial and lower doses may not cause a risk.    Cc: Meldon Sport, MD,  Meldon Sport, MD  Aldona Amel, MD  [facility] [address] [place], [place] [address]  Phone [phone] Fax [phone]  Cc: Meldon Sport, MD,  Meldon Sport, MD  Aldona Amel, MD  [facility] [address] [place], [place] [address]  Phone [phone] Fax [phone]  I spent over 30 minutes of face-to-face and non-face-to-face time with patient on the  1. Migraine with aura and without status migrainosus, not intractable    diagnosis.  This included previsit chart review, lab review, study review,  order entry, electronic health record documentation, patient education on the different diagnostic and therapeutic options, counseling and coordination of care, risks and benefits of management, compliance, or risk factor reduction

## 2024-05-06 ENCOUNTER — Encounter: Payer: Self-pay | Admitting: Internal Medicine

## 2024-05-07 ENCOUNTER — Other Ambulatory Visit: Payer: Self-pay

## 2024-05-07 DIAGNOSIS — K760 Fatty (change of) liver, not elsewhere classified: Secondary | ICD-10-CM

## 2024-05-07 NOTE — Telephone Encounter (Signed)
 scheduled

## 2024-05-09 ENCOUNTER — Other Ambulatory Visit: Payer: Self-pay | Admitting: "Endocrinology

## 2024-05-10 ENCOUNTER — Encounter: Payer: Self-pay | Admitting: Internal Medicine

## 2024-05-10 NOTE — Telephone Encounter (Signed)
 Requested Prescriptions   Pending Prescriptions Disp Refills   MOUNJARO 7.5 MG/0.5ML Pen [Pharmacy Med Name: Mounjaro 7.5 MG/0.5ML Subcutaneous Solution Pen-injector] 4 mL 0    Sig: INJECT 7.5 MG INTO THE SKIN ONCE A WEEK

## 2024-05-11 ENCOUNTER — Ambulatory Visit: Admitting: Internal Medicine

## 2024-05-12 ENCOUNTER — Other Ambulatory Visit: Payer: Self-pay | Admitting: "Endocrinology

## 2024-05-12 DIAGNOSIS — Z794 Long term (current) use of insulin: Secondary | ICD-10-CM

## 2024-05-12 NOTE — Telephone Encounter (Signed)
 Requested Prescriptions   Pending Prescriptions Disp Refills   JARDIANCE  25 MG TABS tablet [Pharmacy Med Name: Jardiance  25 MG Oral Tablet] 30 tablet 0    Sig: TAKE 1 TABLET BY MOUTH ONCE DAILY BEFORE BREAKFAST

## 2024-05-17 ENCOUNTER — Other Ambulatory Visit: Payer: Self-pay | Admitting: Internal Medicine

## 2024-05-17 DIAGNOSIS — I10 Essential (primary) hypertension: Secondary | ICD-10-CM

## 2024-05-19 ENCOUNTER — Ambulatory Visit: Payer: Medicare Other | Admitting: Neurology

## 2024-05-24 ENCOUNTER — Encounter: Payer: Self-pay | Admitting: Internal Medicine

## 2024-05-26 ENCOUNTER — Ambulatory Visit: Payer: Self-pay

## 2024-05-26 ENCOUNTER — Ambulatory Visit: Admitting: Internal Medicine

## 2024-05-26 NOTE — Telephone Encounter (Signed)
  Chief Complaint: reschedule sdv Symptoms: rash Frequency: constant Pertinent Negatives: Patient denies fever, itching, spreading Disposition: [] ED /[] Urgent Care (no appt availability in office) / [x] Appointment(In office/virtual)/ []  [facility]/ [] Home Care/ [] Refused Recommended Disposition /[] [facility]/ []  Follow-up with PCP Additional Notes:  Cancelled same day appointment, declined virtual visit, requesting reschedule in office. Rash under breast is improving, about 25% better. Using Goldbond powder with effect.  Rash is painful only when touching. Moist rash. Acute evaluation scheduled on 05/28/24 with an alternate provider. Educated on care advice as documented in protocol, patient verbalized understanding. Discussed reasons to call back.   Copied from CRM [id number]. Topic: Clinical - Red Word Triage >> May 26, 2024 11:13 AM Chrystal Crape R wrote: Pt has rash between breast with pain. Resch same day app Reason for Disposition  Red, moist, irritated area between skin folds (or under larger breasts)  Protocols used: Rash or Redness - Localized-A-AH

## 2024-05-26 NOTE — Telephone Encounter (Signed)
Noted, patient scheduled.

## 2024-05-28 ENCOUNTER — Telehealth (INDEPENDENT_AMBULATORY_CARE_PROVIDER_SITE_OTHER)

## 2024-05-28 DIAGNOSIS — B354 Tinea corporis: Secondary | ICD-10-CM | POA: Diagnosis not present

## 2024-05-28 MED ORDER — NYSTATIN 100000 UNIT/GM EX POWD: 1.0000 | Freq: Two times a day (BID) | CUTANEOUS | 2 refills | Status: DC

## 2024-05-28 NOTE — Progress Notes (Signed)
   Acute virtual Visit  Patient ID: Sydney Taylor, female    DOB: 1966-02-02, 58 y.o.   MRN: [id number] Virtual Visit via Video Note  I connected with [name] on 05/28/24 at  3:00 PM EDT by a video enabled telemedicine application and verified that I am speaking with the correct person using two identifiers.  Location: Patient: home Provider: office   I discussed the limitations of evaluation and management by telemedicine and the availability of in person appointments. The patient expressed understanding and agreed to proceed.  History of Present Illness: Rash under both breasts for past 4 to 5 days   I discussed the assessment and treatment plan with the patient. The patient was provided an opportunity to ask questions and all were answered. The patient agreed with the plan and demonstrated an understanding of the instructions.   The patient was advised to call back or seek an in-person evaluation if the symptoms worsen or if the condition fails to improve as anticipated.  I provided 10 minutes of non-face-to-face time during this encounter.   Alison Irvine, FNP   Subjective:      ROS     Objective:    LMP  (LMP Unknown)    Physical Exam Vitals and nursing note reviewed.  Neurological:     Mental Status: She is alert and oriented to person, place, and time.  Psychiatric:        Mood and Affect: Mood normal.        Thought Content: Thought content normal.     No results found for any visits on 05/28/24.      Assessment & Plan:   Problem List Items Addressed This Visit   None Visit Diagnoses       Tinea of the body    -  Primary   Relevant Medications   nystatin powder       Meds ordered this encounter  Medications   nystatin powder    Sig: Apply 1 Application topically 2 (two) times daily.    Dispense:  30 g    Refill:  2    No follow-ups on file.  Alison Irvine, FNP

## 2024-06-10 ENCOUNTER — Other Ambulatory Visit: Payer: Self-pay | Admitting: "Endocrinology

## 2024-06-10 DIAGNOSIS — E1165 Type 2 diabetes mellitus with hyperglycemia: Secondary | ICD-10-CM

## 2024-06-10 NOTE — Telephone Encounter (Signed)
 Requested Prescriptions   Pending Prescriptions Disp Refills   JARDIANCE  25 MG TABS tablet [Pharmacy Med Name: Jardiance  25 MG Oral Tablet] 30 tablet 0    Sig: TAKE 1 TABLET BY MOUTH ONCE DAILY BEFORE BREAKFAST

## 2024-06-14 ENCOUNTER — Ambulatory Visit (INDEPENDENT_AMBULATORY_CARE_PROVIDER_SITE_OTHER): Payer: Medicare Other

## 2024-06-14 ENCOUNTER — Other Ambulatory Visit: Payer: Self-pay | Admitting: "Endocrinology

## 2024-06-14 VITALS — BP 120/80 | Ht 69.0 in | Wt 263.0 lb

## 2024-06-14 DIAGNOSIS — Z794 Long term (current) use of insulin: Secondary | ICD-10-CM

## 2024-06-14 DIAGNOSIS — Z0000 Encounter for general adult medical examination without abnormal findings: Secondary | ICD-10-CM | POA: Diagnosis not present

## 2024-06-14 DIAGNOSIS — E1142 Type 2 diabetes mellitus with diabetic polyneuropathy: Secondary | ICD-10-CM

## 2024-06-14 NOTE — Progress Notes (Signed)
 Subjective:   Sydney Taylor is a 58 y.o. who presents for a Medicare Wellness preventive visit.  As a reminder, Annual Wellness Visits don't include a physical exam, and some assessments may be limited, especially if this visit is performed virtually. We may recommend an in-person follow-up visit with your provider if needed.  Visit Complete: Virtual I connected with  Sydney Taylor on 06/14/24 by a video and audio enabled telemedicine application and verified that I am speaking with the correct person using two identifiers.  Patient Location: Home  Provider Location: Home Office  I discussed the limitations of evaluation and management by telemedicine. The patient expressed understanding and agreed to proceed.  Vital Signs: Because this visit was a virtual/telehealth visit, some criteria may be missing or patient reported. Any vitals not documented were not able to be obtained and vitals that have been documented are patient reported.  Persons Participating in Visit: Patient.  AWV Questionnaire: No: Patient Medicare AWV questionnaire was not completed prior to this visit.  Cardiac Risk Factors include: advanced age (>9men, >91 women);diabetes mellitus;dyslipidemia;hypertension;obesity (BMI >30kg/m2);sedentary lifestyle     Objective:    Today's Vitals   06/14/24 1332 06/14/24 1335  BP: 120/80   Weight: 263 lb (119.3 kg)   Height: 5' 9 (1.753 m)   PainSc:  6    Body mass index is 38.84 kg/m.     06/14/2024    1:44 PM 12/16/2023    1:29 PM 12/15/2023    3:03 PM 06/11/2023    2:03 PM 04/07/2023   10:43 AM 11/04/2022    3:33 PM 06/04/2022   12:16 PM  Advanced Directives  Does Patient Have a Medical Advance Directive? Yes No No No Yes Yes Yes  Type of Estate agent of [place];Living will      Healthcare Power of [place];Living will  Does patient want to make changes to medical advance directive?     Yes (MAU/Ambulatory/Procedural Areas -  Information given) Yes (MAU/Ambulatory/Procedural Areas - Information given) No - Patient declined  Copy of Healthcare Power of Attorney in Chart? No - copy requested      No - copy requested  Would patient like information on creating a medical advance directive?  No - Patient declined  No - Patient declined       Current Medications (verified) Outpatient Encounter Medications as of 06/14/2024  Medication Sig   Acetylcysteine (N-ACETYL CYSTEINE) 600 MG CAPS Take 1 capsule by mouth daily.   ALPRAZolam (XANAX) 0.5 MG tablet Take 0.25 mg by mouth daily as needed for anxiety.   B-12 Methylcobalamin 1000 MCG TBDP Take 1,000 mcg by mouth daily.   blood glucose meter kit and supplies KIT Inject 1 each into the skin daily. Check blood glucose once a day. Diagnosis code: E 11.9.   calcium  carbonate (TUMS - DOSED IN MG ELEMENTAL CALCIUM ) 500 MG chewable tablet Chew 1,000 mg by mouth daily as needed for indigestion or heartburn.   chlorthalidone  (HYGROTON ) 25 MG tablet Take 1 tablet (25 mg total) by mouth daily.   Continuous Glucose Receiver (DEXCOM G7 RECEIVER) DEVI 1 each by Does not apply route continuous.   Continuous Glucose Sensor (DEXCOM G7 SENSOR) MISC Use to check glucose continuously, change sensor every 10 days   cyclobenzaprine  (FLEXERIL ) 5 MG tablet Take 1 tablet (5 mg total) by mouth 2 (two) times daily as needed.   DULoxetine (CYMBALTA) 60 MG capsule Take 120 mg by mouth every morning. Two capsules (120mg ) daily  gabapentin  (NEURONTIN ) 300 MG capsule Take 1 capsule (300 mg total) by mouth at bedtime. X 1 week then can increase to 600 mg QHS if possible- for FMS   glucose blood (ACCU-CHEK GUIDE) test strip 1 each by Other route in the morning, at noon, and at bedtime. Use as instructed   insulin  degludec (TRESIBA  FLEXTOUCH) 200 UNIT/ML FlexTouch Pen Inject 86 Units into the skin 2 (two) times daily.   Insulin  Degludec FlexTouch 200 UNIT/ML SOPN 86 units qam, 84 units qpm   insulin  lispro  (HUMALOG  KWIKPEN) 100 UNIT/ML KwikPen Inject 1-5 Units into the skin as needed.   Insulin  Pen Needle (PEN NEEDLES) 32G X 4 MM MISC Check BS 2 x a day   Insulin  Pen Needle (RELION PEN NEEDLES) 31G X 6 MM MISC USE 2 PEN NEEDLEs to check blood glucose twice a day   JARDIANCE  25 MG TABS tablet TAKE 1 TABLET BY MOUTH ONCE DAILY BEFORE BREAKFAST   L-Methylfolate 15 MG TABS Take 15 mg by mouth daily at 6 (six) AM.   losartan  (COZAAR ) 25 MG tablet Take 1 tablet by mouth once daily   metFORMIN  (GLUCOPHAGE -XR) 500 MG 24 hr tablet Take 2 tablets (1,000 mg total) by mouth daily with supper.   metoprolol  tartrate (LOPRESSOR ) 25 MG tablet Take 1 tablet by mouth twice daily   modafinil (PROVIGIL) 100 MG tablet Take 100 mg by mouth every morning.   MOUNJARO  7.5 MG/0.5ML Pen INJECT 7.5 MG INTO THE SKIN ONCE A WEEK   Multiple Vitamins-Minerals (MULTIVITAMIN WOMEN 50+) TABS Take 1 tablet by mouth daily.   NONFORMULARY OR COMPOUNDED ITEM Take 4 mg by mouth at bedtime. X 2 weeks, then 8 mg nightly- for nerve pain and chronic fatigue- sent in 2 week Rx for 4 mg and 6 months supply for 8 mg- sent to [facility] (Patient taking differently: Take 8 mg by mouth at bedtime. Naltrexone 8mg )   nystatin  powder Apply 1 Application topically 2 (two) times daily.   omega-3 acid ethyl esters (LOVAZA ) 1 g capsule Take 2 capsules (2 g total) by mouth 2 (two) times daily.   pantoprazole  (PROTONIX ) 40 MG tablet Take 1 tablet (40 mg total) by mouth daily.   potassium chloride  SA (KLOR-CON  M) 20 MEQ tablet Take 2 tablets by mouth twice daily   rosuvastatin  (CRESTOR ) 5 MG tablet Take 1 tablet (5 mg total) by mouth daily.   SUMAtriptan  (IMITREX ) 100 MG tablet Take 1 tablet (100 mg total) by mouth every 2 (two) hours as needed for migraine. May repeat in 2 hours if headache persists or recurs. Maximum 2 per day.   topiramate  (TOPAMAX ) 25 MG tablet Take 1 tablet (25 mg total) by mouth at bedtime.   TRESIBA  FLEXTOUCH 200 UNIT/ML  FlexTouch Pen INJECT 170 UNITS SUBCUTANEOUSLY ONCE DAILY   No facility-administered encounter medications on file as of 06/14/2024.    Allergies (verified) Cephalexin    History: Past Medical History:  Diagnosis Date   Anemia    Anxiety    Arrhythmia    Arthritis    Bipolar depression (HCC) 12/28/2019   Cataract    Chronic fatigue    Chronic headaches    Depression    Diabetes mellitus without complication (HCC)    Fibromyalgia    GERD (gastroesophageal reflux disease)    Heart murmur    High cholesterol    HLD (hyperlipidemia)    Hypertension    Insect bite    Migraine    Prediabetes  Sleep apnea    Thyroid  disease    Urinary incontinence    Vasculitis [facility])    Past Surgical History:  Procedure Laterality Date   BIOPSY  09/04/2022   Procedure: BIOPSY;  Surgeon: Suzette Espy, MD;  [facility];  Service: Endoscopy;;   CHOLECYSTECTOMY N/A 03/15/2022   Procedure: LAPAROSCOPIC CHOLECYSTECTOMY WITH INTRAOPERATIVE CHOLANGIOGRAM;  Surgeon: Awilda Bogus, MD;  Location: [facility] ORS;  Service: General;  Laterality: N/A;   CHOLECYSTECTOMY  02/03/2022   COLONOSCOPY  2017   per patient normal and needs next one in 2027 (Dr. Lydia Sams)   COLONOSCOPY WITH PROPOFOL  N/A 09/04/2022   Procedure: COLONOSCOPY WITH PROPOFOL ;  Surgeon: Suzette Espy, MD;  [facility];  Service: Endoscopy;  Laterality: N/A;   ESOPHAGOGASTRODUODENOSCOPY N/A 05/12/2020   Procedure: ESOPHAGOGASTRODUODENOSCOPY (EGD);  Surgeon: Suzette Espy, MD;  [facility];  Service: Endoscopy;  Laterality: N/A;  11:15am   Joint fusion on foot Right    LIVER BIOPSY N/A 03/15/2022   Procedure: LIVER BIOPSY;  Surgeon: Awilda Bogus, MD;  Location: [facility];  Service: General;  Laterality: N/A;   MALONEY DILATION N/A 05/12/2020   Procedure: Dorothyann Gather;  Surgeon: Suzette Espy, MD;  [facility];  Service: Endoscopy;  Laterality: N/A;   NASAL SEPTUM SURGERY      POLYPECTOMY  09/04/2022   Procedure: POLYPECTOMY;  Surgeon: Suzette Espy, MD;  [facility];  Service: Endoscopy;;   SINOSCOPY     Family History  Problem Relation Age of Onset   Hypertension Father    Hyperlipidemia Father    Diabetes Father    Stroke Father    Depression Father    Anxiety disorder Father    Hearing loss Father    Heart disease Father    Obesity Father    Hypertension Mother    Hyperlipidemia Mother    Diabetes Mellitus II Mother    Congestive Heart Failure Mother    Heart attack Mother    Anxiety disorder Mother    Arthritis Mother    Diabetes Mother    Heart disease Mother    Obesity Mother    Varicose Veins Mother    Hypertension Sister    Diabetes Sister    Hyperlipidemia Sister    Obesity Sister    Hypertension Brother    Autoimmune disease Brother    Vision loss Brother    Diabetes Maternal Grandmother    Arthritis Maternal Grandmother    Obesity Maternal Grandmother    Heart disease Maternal Grandfather    Stroke Maternal Grandfather    Lupus Paternal Grandmother    Depression Paternal Grandmother    Anxiety disorder Paternal Grandmother    Heart disease Paternal Grandmother    Alcohol abuse Paternal Grandfather    Depression Maternal Aunt    Anxiety disorder Maternal Aunt    Cancer Maternal Aunt    Diabetes Paternal Uncle    Colon cancer Neg Hx    Esophageal cancer Neg Hx    Gastric cancer Neg Hx    Social History   Socioeconomic History   Marital status: Married    Spouse name: Not on file   Number of children: 0   Years of education: Not on file   Highest education level: Bachelor's degree (e.g., BA, AB, BS)  Occupational History   Occupation: research associate/drug development  Tobacco Use   Smoking status: Former    Current packs/day: 0.00  Types: Cigarettes    Quit date: 12/30/1982    Years since quitting: 41.4   Smokeless tobacco: Never  Vaping Use   Vaping status: Never Used  Substance and Sexual  Activity   Alcohol use: Not Currently    Alcohol/week: 0.0 standard drinks of alcohol   Drug use: Never   Sexual activity: Yes    Birth control/protection: Post-menopausal  Other Topics Concern   Not on file  Social History Narrative   Married for 3 years.Lives with husband.Bachelors in Building surveyor.   Right handed   Caffeine: 1-2 per day    Social Drivers of Health   Financial Resource Strain: Low Risk  (06/14/2024)   Overall Financial Resource Strain (CARDIA)    Difficulty of Paying Living Expenses: Not hard at all  Food Insecurity: No Food Insecurity (06/14/2024)   Hunger Vital Sign    Worried About Running Out of Food in the Last Year: Never true    Ran Out of Food in the Last Year: Never true  Transportation Needs: No Transportation Needs (06/14/2024)   PRAPARE - Administrator, Civil Service (Medical): No    Lack of Transportation (Non-Medical): No  Physical Activity: Inactive (06/14/2024)   Exercise Vital Sign    Days of Exercise per Week: 0 days    Minutes of Exercise per Session: 0 min  Stress: Stress Concern Present (06/14/2024)   [employer] of Occupational Health - Occupational Stress Questionnaire    Feeling of Stress: To some extent  Social Connections: Moderately Isolated (06/14/2024)   Social Connection and Isolation Panel    Frequency of Communication with Friends and Family: More than three times a week    Frequency of Social Gatherings with Friends and Family: Once a week    Attends Religious Services: Never    Database administrator or Organizations: No    Attends Engineer, structural: Never    Marital Status: Married    Tobacco Counseling Counseling given: Yes    Clinical Intake:  Pre-visit preparation completed: Yes  Pain : 0-10 Pain Score: 6  Pain Type: Chronic pain Pain Location: Neck Pain Orientation: Posterior Pain Radiating Towards: shoulder blades bilaterally Pain Descriptors / Indicators: Constant, Sore Pain Onset:  More than a month ago (Fibromyalgia pain) Pain Frequency: Constant     BMI - recorded: 38.84 Nutritional Risks: None Diabetes: Yes CBG done?: No (telehealth visit unable to obtain)  Lab Results  Component Value Date   HGBA1C 6.8 (H) 03/25/2024   HGBA1C 7.2 (H) 08/11/2023   HGBA1C 7.2 (H) 04/29/2023     How often do you need to have someone help you when you read instructions, pamphlets, or other written materials from your doctor or pharmacy?: 1 - Never  Interpreter Needed?: No  Information entered by :: Rockne Chyle W CMA   Activities of Daily Living     06/14/2024    1:46 PM  In your present state of health, do you have any difficulty performing the following activities:  Hearing? 0  Vision? 0  Difficulty concentrating or making decisions? 0  Walking or climbing stairs? 0  Dressing or bathing? 0  Doing errands, shopping? 0  Preparing Food and eating ? N  Using the Toilet? N  In the past six months, have you accidently leaked urine? N  Do you have problems with loss of bowel control? N  Managing your Medications? N  Managing your Finances? N  Housekeeping or managing your Housekeeping? N    Patient  Care Team: Meldon Sport, MD as PCP - General (Internal Medicine) Riley Cheadle, Windsor Hatcher, MD as Consulting Physician (Gastroenterology) Cleophas Dadds, [facility] (Inactive) (Pharmacist) Elmyra Haggard, MD as Consulting Physician (Cardiology) [facility] as Consulting Physician (Psychiatry) [facility] as Consulting Physician (Psychology) Elvie Hammed, MD as Referring Physician (Obstetrics and Gynecology) Annamae Barrett, [facility] as Consulting Physician (Ophthalmology) [facility] as Consulting Physician (Obstetrics and Gynecology) Chloe Counter, MD as Referring Physician (Obstetrics and Gynecology) Jorge Newcomer, MD as Consulting Physician (Endocrinology) Glory Larsen, MD as Consulting Physician  (Neurology) Celia Coles, MD as Consulting Physician (Physical Medicine and Rehabilitation)  I have updated your Care Teams any recent Medical Services you may have received from other providers in the past year.     Assessment:   This is a routine wellness examination for [place].  Hearing/Vision screen Hearing Screening - Comments:: Patient denies any hearing difficulties.   Vision Screening - Comments:: Wears rx glasses - up to date with routine eye exams  Patient sees [facility]    Goals Addressed             This Visit's Progress    Patient Stated   On track    Would like to not feel like crap all the time and be exhausted all the time   Would like to lose weight atleast 100lbs  Reviewed with patient on 06/14/2024 during AWV     Patient Stated   On track    Patients goal is to lose weight and lower her A1C  Reviewed w/ patient during AWV 06/14/2024..Nichael Ehly       Depression Screen     06/14/2024    1:38 PM 05/28/2024    2:54 PM 01/28/2024   11:19 AM 12/16/2023    1:35 PM 10/24/2023    3:12 PM 09/19/2023   10:04 AM 09/03/2023    2:54 PM  PHQ 2/9 Scores  PHQ - 2 Score 0 0 0 0 0 0 0  PHQ- 9 Score 0 0 0   0 0    Fall Risk     06/14/2024    1:45 PM 05/28/2024    2:53 PM 01/28/2024   11:18 AM 01/16/2024    2:42 PM 11/24/2023    2:10 PM  Fall Risk   Falls in the past year? 0 0 0 0 0  Number falls in past yr: 0 0 0  0  Injury with Fall? 0 0 0  0  Risk for fall due to : No Fall Risks No Fall Risks No Fall Risks  No Fall Risks  Follow up Falls evaluation completed Falls evaluation completed Falls evaluation completed  Falls evaluation completed    MEDICARE RISK AT HOME:  Medicare Risk at Home Any stairs in or around the home?: Yes If so, are there any without handrails?: No Home free of loose throw rugs in walkways, pet beds, electrical cords, etc?: Yes Adequate lighting in your home to reduce risk of falls?: Yes Life alert?: No Use of a  cane, walker or w/c?: No Grab bars in the bathroom?: No Shower chair or bench in shower?: Yes Elevated toilet seat or a handicapped toilet?: No  TIMED UP AND GO:  Was the test performed?  No  Cognitive Function: 6CIT completed    06/04/2022   12:17 PM  MMSE - Mini Mental State Exam  Not completed: Unable to complete  06/14/2024    1:46 PM 06/11/2023    2:04 PM 06/04/2022   12:17 PM 05/30/2021    2:07 PM  6CIT Screen  What Year? 0 points 0 points 0 points   What month? 0 points 0 points 0 points   What time? 0 points 0 points 0 points 0 points  Count back from 20 0 points 0 points 0 points 0 points  Months in reverse 0 points 0 points 0 points 0 points  Repeat phrase 0 points 0 points 0 points 0 points  Total Score 0 points 0 points 0 points     Immunizations Immunization History  Administered Date(s) Administered   Influenza Whole 10/30/2021   Influenza, Seasonal, Injecte, Preservative Fre 09/19/2023   Influenza,inj,Quad PF,6+ Mos 11/01/2019, 10/30/2022   Influenza-Unspecified 10/30/2020   Moderna Covid-19 Vaccine Bivalent Booster 96yrs & up 05/30/2022   Moderna Sars-Covid-2 Vaccination 05/13/2020, 06/23/2020, 07/12/2021   Pneumococcal Polysaccharide-23 12/18/2020   Td 12/30/2020   Unspecified SARS-COV-2 Vaccination 10/07/2023   Zoster Recombinant(Shingrix) 12/30/2020, 05/29/2021    Screening Tests Health Maintenance  Topic Date Due   Cervical Cancer Screening (HPV/Pap Cotest)  Never done   Pneumococcal Vaccine 84-17 Years old (2 of 2 - PCV) 12/18/2021   MAMMOGRAM  01/22/2023   FOOT EXAM  03/04/2024   Diabetic kidney evaluation - Urine ACR  04/28/2024   INFLUENZA VACCINE  07/30/2024   HEMOGLOBIN A1C  09/25/2024   OPHTHALMOLOGY EXAM  10/06/2024   Diabetic kidney evaluation - eGFR measurement  03/25/2025   Medicare Annual Wellness (AWV)  06/14/2025   Colonoscopy  09/04/2029   DTaP/Tdap/Td (2 - Tdap) 12/30/2030   COVID-19 Vaccine  Completed   Hepatitis C  Screening  Completed   HIV Screening  Completed   Zoster Vaccines- Shingrix  Completed   HPV VACCINES  Aged Out   Meningococcal B Vaccine  Aged Out    Health Maintenance  Health Maintenance Due  Topic Date Due   Cervical Cancer Screening (HPV/Pap Cotest)  Never done   Pneumococcal Vaccine 50-42 Years old (2 of 2 - PCV) 12/18/2021   MAMMOGRAM  01/22/2023   FOOT EXAM  03/04/2024   Diabetic kidney evaluation - Urine ACR  04/28/2024   Health Maintenance Items Addressed:  Lab Orders         Microalbumin / creatinine urine ratio    Patient needs a diabetic foot exam to close health maintenance gap. Please complete at her visit on June 16, 2024.  NHA will request recent mammogram and cervical cancer screening reports from [facility] [place] Diabetic Kidney Evaluation Urine acr ordered and patient aware to have completed on Wednesday  Additional Screening:  Vision Screening: Recommended annual ophthalmology exams for early detection of glaucoma and other disorders of the eye. Would you like a referral to an eye doctor? No    Dental Screening: Recommended annual dental exams for proper oral hygiene  Community Resource Referral / Chronic Care Management: CRR required this visit?  No   CCM required this visit?  No   Plan: Patient needs a diabetic foot exam to close health maintenance gap. Please complete at her visit on June 16, 2024.  NHA will request recent mammogram and cervical cancer screening reports from [facility] [place] Diabetic Kidney Evaluation Urine acr ordered and patient aware to have completed on Wednesday    I have personally reviewed and noted the following in the patient's chart:   Medical and social history Use of alcohol, tobacco or illicit  drugs  Current medications and supplements including opioid prescriptions. Patient is not currently taking opioid prescriptions. Functional ability and status Nutritional status Physical  activity Advanced directives List of other physicians Hospitalizations, surgeries, and ER visits in previous 12 months Vitals Screenings to include cognitive, depression, and falls Referrals and appointments  In addition, I have reviewed and discussed with patient certain preventive protocols, quality metrics, and best practice recommendations. A written personalized care plan for preventive services as well as general preventive health recommendations were provided to patient.   Vontae Court, CMA   06/14/2024   After Visit Summary: (MyChart) Due to this being a telephonic visit, the after visit summary with patients personalized plan was offered to patient via MyChart   Notes: Patient needs a diabetic foot exam to close health maintenance gap. Please complete at her visit on June 16, 2024.  NHA will request recent mammogram and cervical cancer screening reports from [facility] [place] Diabetic Kidney Evaluation Urine acr ordered and patient aware to have completed on Wednesday

## 2024-06-14 NOTE — Patient Instructions (Addendum)
 Ms. Sydney Taylor , Thank you for taking time out of your busy schedule to complete your Annual Wellness Visit with me. I enjoyed our conversation and look forward to speaking with you again next year. I, as well as your care team,  appreciate your ongoing commitment to your health goals. Please review the following plan we discussed and let me know if I can assist you in the future. Your Game plan/ To Do List    Referrals: If you haven't heard from the office you've been referred to, please reach out to them at the phone provided.   Lab Orders         Microalbumin / creatinine urine ratio    Have this completed Wednesday when you see Dr. Lydia Sams for your physical  Follow up Visits: Next Medicare AWV with our clinical staff: June 15, 2025 at 10:00 am video visit   Have you seen your provider in the last 6 months (3 months if uncontrolled diabetes)? Yes Next Office Visit with your provider: June 16, 2024 at 10:00 am in person  Clinician Recommendations:  Aim for 30 minutes of exercise or brisk walking, 6-8 glasses of water, and 5 servings of fruits and vegetables each day.       This is a list of the screening recommended for you and due dates:  Health Maintenance  Topic Date Due   Pap with HPV screening  Never done   Pneumococcal Vaccination (2 of 2 - PCV) 12/18/2021   Mammogram  01/22/2023   Complete foot exam   03/04/2024   Yearly kidney health urinalysis for diabetes  04/28/2024   Flu Shot  07/30/2024   Hemoglobin A1C  09/25/2024   Eye exam for diabetics  10/06/2024   Yearly kidney function blood test for diabetes  03/25/2025   Medicare Annual Wellness Visit  06/14/2025   Colon Cancer Screening  09/04/2029   DTaP/Tdap/Td vaccine (2 - Tdap) 12/30/2030   COVID-19 Vaccine  Completed   Hepatitis C Screening  Completed   HIV Screening  Completed   Zoster (Shingles) Vaccine  Completed   HPV Vaccine  Aged Out   Meningitis B Vaccine  Aged Out    Advanced directives: (Copy Requested) Please  bring a copy of your health care power of attorney and living will to the office to be added to your chart at your convenience. You can mail to [facility] [address] [place], [place] [address] or email to [email or web address] Advance Care Planning is important because it:  [x]  Makes sure you receive the medical care that is consistent with your values, goals, and preferences  [x]  It provides guidance to your family and loved ones and reduces their decisional burden about whether or not they are making the right decisions based on your wishes.  Follow the link provided in your after visit summary or read over the paperwork we have mailed to you to help you started getting your Advance Directives in place. If you need assistance in completing these, please reach out to us  so that we can help you!  Understanding Your Risk for Falls Millions of people have serious injuries from falls each year. It is important to understand your risk of falling. Talk with your health care provider about your risk and what you can do to lower it. If you do have a serious fall, make sure to tell your provider. Falling once raises your risk of falling again. How can falls affect me? Serious injuries from  falls are common. These include: Broken bones, such as hip fractures. Head injuries, such as traumatic brain injuries (TBI) or concussions. A fear of falling can cause you to avoid activities and stay at home. This can make your muscles weaker and raise your risk for a fall. What can increase my risk? There are a number of risk factors that increase your risk for falling. The more risk factors you have, the higher your risk of falling. Serious injuries from a fall happen most often to people who are older than 59 years old. Teenagers and young adults ages 8-29 are also at higher risk. Common risk factors include: Weakness in the lower body. Being generally weak or confused due to long-term  (chronic) illness. Dizziness or balance problems. Poor vision. Medicines that cause dizziness or drowsiness. These may include: Medicines for your blood pressure, heart, anxiety, insomnia, or swelling (edema). Pain medicines. Muscle relaxants. Other risk factors include: Drinking alcohol. Having had a fall in the past. Having foot pain or wearing improper footwear. Working at a dangerous job. Having any of the following in your home: Tripping hazards, such as floor clutter or loose rugs. Poor lighting. Pets. Having dementia or memory loss. What actions can I take to lower my risk of falling?     Physical activity Stay physically fit. Do strength and balance exercises. Consider taking a regular class to build strength and balance. Yoga and tai chi are good options. Vision Have your eyes checked every year and your prescription for glasses or contacts updated as needed. Shoes and walking aids Wear non-skid shoes. Wear shoes that have rubber soles and low heels. Do not wear high heels. Do not walk around the house in socks or slippers. Use a cane or walker as told by your provider. Home safety Attach secure railings on both sides of your stairs. Install grab bars for your bathtub, shower, and toilet. Use a non-skid mat in your bathtub or shower. Attach bath mats securely with double-sided, non-slip rug tape. Use good lighting in all rooms. Keep a flashlight near your bed. Make sure there is a clear path from your bed to the bathroom. Use night-lights. Do not use throw rugs. Make sure all carpeting is taped or tacked down securely. Remove all clutter from walkways and stairways, including extension cords. Repair uneven or broken steps and floors. Avoid walking on icy or slippery surfaces. Walk on the grass instead of on icy or slick sidewalks. Use ice melter to get rid of ice on walkways in the winter. Use a cordless phone. Questions to ask your health care provider Can you help  me check my risk for a fall? Do any of my medicines make me more likely to fall? Should I take a vitamin D  supplement? What exercises can I do to improve my strength and balance? Should I make an appointment to have my vision checked? Do I need a bone density test to check for weak bones (osteoporosis)? Would it help to use a cane or a walker? Where to find more information Centers for Disease Control and Prevention, STEAD[employer]: [employer].no Community-Based Fall Prevention Programs: TonerProm[employer].no [employer] on Aging: Base[employer].pl Contact a health care provider if: You fall at home. You are afraid of falling at home. You feel weak, drowsy, or dizzy. This information is not intended to replace advice given to you by your health care provider. Make sure you discuss any questions you have with your health care provider. Document Revised: 08/19/2022 Document Reviewed: 08/19/2022 Elsevier  Patient Education  2024 [employer].

## 2024-06-15 ENCOUNTER — Inpatient Hospital Stay: Payer: Medicare Other

## 2024-06-15 NOTE — Telephone Encounter (Signed)
 Requested Prescriptions   Pending Prescriptions Disp Refills   MOUNJARO 7.5 MG/0.5ML Pen [Pharmacy Med Name: Mounjaro 7.5 MG/0.5ML Subcutaneous Solution Pen-injector] 4 mL 0    Sig: INJECT 7.5 MG INTO THE SKIN ONCE A WEEK

## 2024-06-16 ENCOUNTER — Inpatient Hospital Stay: Attending: Hematology

## 2024-06-16 ENCOUNTER — Ambulatory Visit: Payer: Self-pay | Admitting: Internal Medicine

## 2024-06-16 ENCOUNTER — Encounter: Payer: Self-pay | Admitting: Internal Medicine

## 2024-06-16 VITALS — BP 106/71 | HR 91 | Ht 69.0 in | Wt 268.4 lb

## 2024-06-16 DIAGNOSIS — Z23 Encounter for immunization: Secondary | ICD-10-CM

## 2024-06-16 DIAGNOSIS — E611 Iron deficiency: Secondary | ICD-10-CM

## 2024-06-16 DIAGNOSIS — F319 Bipolar disorder, unspecified: Secondary | ICD-10-CM

## 2024-06-16 DIAGNOSIS — Z794 Long term (current) use of insulin: Secondary | ICD-10-CM

## 2024-06-16 DIAGNOSIS — D509 Iron deficiency anemia, unspecified: Secondary | ICD-10-CM | POA: Diagnosis present

## 2024-06-16 DIAGNOSIS — Z0001 Encounter for general adult medical examination with abnormal findings: Secondary | ICD-10-CM

## 2024-06-16 DIAGNOSIS — I10 Essential (primary) hypertension: Secondary | ICD-10-CM

## 2024-06-16 DIAGNOSIS — E1142 Type 2 diabetes mellitus with diabetic polyneuropathy: Secondary | ICD-10-CM

## 2024-06-16 DIAGNOSIS — M7989 Other specified soft tissue disorders: Secondary | ICD-10-CM

## 2024-06-16 DIAGNOSIS — E782 Mixed hyperlipidemia: Secondary | ICD-10-CM

## 2024-06-16 LAB — CBC WITH DIFFERENTIAL/PLATELET
Abs Immature Granulocytes: 0.04 10*3/uL (ref 0.00–0.07)
Basophils Absolute: 0.1 10*3/uL (ref 0.0–0.1)
Basophils Relative: 1 %
Eosinophils Absolute: 0.5 10*3/uL (ref 0.0–0.5)
Eosinophils Relative: 5 %
HCT: 46.7 % — ABNORMAL HIGH (ref 36.0–46.0)
Hemoglobin: 15.4 g/dL — ABNORMAL HIGH (ref 12.0–15.0)
Immature Granulocytes: 0 %
Lymphocytes Relative: 20 %
Lymphs Abs: 1.9 10*3/uL (ref 0.7–4.0)
MCH: 31.2 pg (ref 26.0–34.0)
MCHC: 33 g/dL (ref 30.0–36.0)
MCV: 94.7 fL (ref 80.0–100.0)
Monocytes Absolute: 0.8 10*3/uL (ref 0.1–1.0)
Monocytes Relative: 9 %
Neutro Abs: 6 10*3/uL (ref 1.7–7.7)
Neutrophils Relative %: 65 %
Platelets: 263 10*3/uL (ref 150–400)
RBC: 4.93 MIL/uL (ref 3.87–5.11)
RDW: 13.2 % (ref 11.5–15.5)
WBC: 9.3 10*3/uL (ref 4.0–10.5)
nRBC: 0 % (ref 0.0–0.2)

## 2024-06-16 LAB — FERRITIN: Ferritin: 62 ng/mL (ref 11–307)

## 2024-06-16 LAB — IRON AND TIBC
Iron: 63 ug/dL (ref 28–170)
Saturation Ratios: 18 % (ref 10.4–31.8)
TIBC: 347 ug/dL (ref 250–450)
UIBC: 284 ug/dL

## 2024-06-16 MED ORDER — CHLORTHALIDONE 25 MG PO TABS
12.5000 mg | ORAL_TABLET | Freq: Every day | ORAL | 3 refills | Status: DC
Start: 2024-06-16 — End: 2024-08-04

## 2024-06-16 MED ORDER — ROSUVASTATIN CALCIUM 5 MG PO TABS
5.0000 mg | ORAL_TABLET | Freq: Every day | ORAL | 3 refills | Status: AC
Start: 2024-06-16 — End: ?

## 2024-06-16 MED ORDER — LOSARTAN POTASSIUM 25 MG PO TABS
25.0000 mg | ORAL_TABLET | Freq: Every day | ORAL | 3 refills | Status: AC
Start: 2024-06-16 — End: ?

## 2024-06-16 NOTE — Assessment & Plan Note (Signed)
On Gabapentin 300 mg qHS

## 2024-06-16 NOTE — Assessment & Plan Note (Signed)
 BP Readings from Last 1 Encounters:  06/16/24 106/71   Well-controlled with losartan  25 mg QD, Chlorthalidone  12.5 mg QD and Metoprolol  25 mg BID Counseled for compliance with the medications Advised DASH diet and moderate exercise/walking, at least 150 mins/week

## 2024-06-16 NOTE — Assessment & Plan Note (Signed)
  Lab Results  Component Value Date   HGBA1C 6.8 (H) 03/25/2024   Overall well controlled compared to prior On Jardiance  25 mg QD and Mounjaro  7.5 mg qw On metformin  XR 1000 mg QD and Toujeo  170 units in 2 divided doses Followed by endocrinology Advised to follow diabetic diet On ARB and statin F/u CMP and lipid panel Diabetic eye exam: Advised to follow up with Ophthalmology for diabetic eye exam

## 2024-06-16 NOTE — Progress Notes (Signed)
 Established Patient Office Visit  Subjective:  Patient ID: Sydney Taylor, female    DOB: 1966-01-18  Age: 58 y.o. MRN: [id number]  CC:  Chief Complaint  Patient presents with   Annual Exam    Annual exam, 6 month f/u   Foot Pain    Reports right pain, has a sensible spot on the bottom of her foot. Ongoing since 2 months ago.     HPI Sydney Taylor is a 59 y.o. female with past medical history of OSA, GERD, type II DM with diabetic neuropathy, HLD, tachycardia, depression with anxiety, fibromyalgia and obesity who presents for annual physical.   Type II DM: She is tolerating Mounjaro  well.  She is on Toujeo  170 units in 2 divided doses, metformin  1000 mg daily and Jardiance  25 mg daily.  She reports episodes of hypoglycemia at times, and has tried to avoid evening dose of Toujeo . Followed by endocrinology.  She has CGM, blood glucose averages are as follows:  7-days: 164 30-days: 156 90-days: 169  She has chronic fatigue, but denies any polyuria or polyphagia.   Bipolar depression: She sees [facility] for it.  She is on Cymbalta and modafinil currently.  She still has chronic fatigue and insomnia.  She has had suicidal thoughts in the past, but denies any plan.   Fibromyalgia: She has chronic myalgias and takes Cymbalta and gabapentin  300 mg nightly currently.  She has tried Lyrica in the past as well.  She has chronic nasal congestion, postnasal drip, sore throat and dry cough. She is taking Claritin. She has had ENT and allergy specialist evaluation in the past.  She denies any fever or chills currently.  She reports having a glass piece injury on right foot about 2 months ago. She went to Urgent care later, had x-ray of foot, which was negative for foreign body.  She has noticed improvement in the pain, but still has mild discomfort on the injury site.  Likely has a callus formation on the right foot.    Past Medical History:  Diagnosis Date   Anemia    Anxiety     Arrhythmia    Arthritis    Bipolar depression (HCC) 12/28/2019   Cataract    Chronic fatigue    Chronic headaches    Depression    Diabetes mellitus without complication (HCC)    Fibromyalgia    GERD (gastroesophageal reflux disease)    Heart murmur    High cholesterol    HLD (hyperlipidemia)    Hypertension    Insect bite    Migraine    Prediabetes    Sleep apnea    Thyroid  disease    Urinary incontinence    Vasculitis (HCC)     Past Surgical History:  Procedure Laterality Date   BIOPSY  09/04/2022   Procedure: BIOPSY;  Surgeon: Suzette Espy, MD;  Location: [facility];  Service: Endoscopy;;   CHOLECYSTECTOMY N/A 03/15/2022   Procedure: LAPAROSCOPIC CHOLECYSTECTOMY WITH INTRAOPERATIVE CHOLANGIOGRAM;  Surgeon: Awilda Bogus, MD;  Location: [facility];  Service: General;  Laterality: N/A;   CHOLECYSTECTOMY  02/03/2022   COLONOSCOPY  2017   per patient normal and needs next one in 2027 (Dr. Lydia Sams)   COLONOSCOPY WITH PROPOFOL  N/A 09/04/2022   Procedure: COLONOSCOPY WITH PROPOFOL ;  Surgeon: Suzette Espy, MD;  Location: [facility];  Service: Endoscopy;  Laterality: N/A;   ESOPHAGOGASTRODUODENOSCOPY N/A 05/12/2020   Procedure: ESOPHAGOGASTRODUODENOSCOPY (EGD);  Surgeon: Suzette Espy, MD;  Location: [facility];  Service: Endoscopy;  Laterality: N/A;  11:15am   Joint fusion on foot Right    LIVER BIOPSY N/A 03/15/2022   Procedure: LIVER BIOPSY;  Surgeon: Awilda Bogus, MD;  Location: [facility];  Service: General;  Laterality: N/A;   MALONEY DILATION N/A 05/12/2020   Procedure: Dorothyann Gather;  Surgeon: Suzette Espy, MD;  Location: [facility];  Service: Endoscopy;  Laterality: N/A;   NASAL SEPTUM SURGERY     POLYPECTOMY  09/04/2022   Procedure: POLYPECTOMY;  Surgeon: Suzette Espy, MD;  Location: [facility];  Service: Endoscopy;;   SINOSCOPY      Family History  Problem Relation Age of Onset   Hypertension Father    Hyperlipidemia Father     Diabetes Father    Stroke Father    Depression Father    Anxiety disorder Father    Hearing loss Father    Heart disease Father    Obesity Father    Hypertension Mother    Hyperlipidemia Mother    Diabetes Mellitus II Mother    Congestive Heart Failure Mother    Heart attack Mother    Anxiety disorder Mother    Arthritis Mother    Diabetes Mother    Heart disease Mother    Obesity Mother    Varicose Veins Mother    Hypertension Sister    Diabetes Sister    Hyperlipidemia Sister    Obesity Sister    Hypertension Brother    Autoimmune disease Brother    Vision loss Brother    Diabetes Maternal Grandmother    Arthritis Maternal Grandmother    Obesity Maternal Grandmother    Heart disease Maternal Grandfather    Stroke Maternal Grandfather    Lupus Paternal Grandmother    Depression Paternal Grandmother    Anxiety disorder Paternal Grandmother    Heart disease Paternal Grandmother    Alcohol abuse Paternal Grandfather    Depression Maternal Aunt    Anxiety disorder Maternal Aunt    Cancer Maternal Aunt    Diabetes Paternal Uncle    Colon cancer Neg Hx    Esophageal cancer Neg Hx    Gastric cancer Neg Hx     Social History   Socioeconomic History   Marital status: Married    Spouse name: Not on file   Number of children: 0   Years of education: Not on file   Highest education level: Bachelor's degree (e.g., BA, AB, BS)  Occupational History   Occupation: research associate/drug development  Tobacco Use   Smoking status: Former    Current packs/day: 0.00    Types: Cigarettes    Quit date: 12/30/1982    Years since quitting: 41.4   Smokeless tobacco: Never  Vaping Use   Vaping status: Never Used  Substance and Sexual Activity   Alcohol use: Not Currently    Alcohol/week: 0.0 standard drinks of alcohol   Drug use: Never   Sexual activity: Yes    Birth control/protection: Post-menopausal  Other Topics Concern   Not on file  Social History Narrative    Married for 3 years.Lives with husband.Bachelors in Building surveyor.   Right handed   Caffeine: 1-2 per day    Social Drivers of Health   Financial Resource Strain: Low Risk  (06/14/2024)   Overall Financial Resource Strain (CARDIA)    Difficulty of Paying Living Expenses: Not hard at all  Food Insecurity: No Food Insecurity (06/14/2024)   Hunger Vital Sign  Worried About Programme researcher, broadcasting/film/video in the Last Year: Never true    Ran Out of Food in the Last Year: Never true  Transportation Needs: No Transportation Needs (06/14/2024)   PRAPARE - Administrator, Civil Service (Medical): No    Lack of Transportation (Non-Medical): No  Physical Activity: Inactive (06/14/2024)   Exercise Vital Sign    Days of Exercise per Week: 0 days    Minutes of Exercise per Session: 0 min  Stress: Stress Concern Present (06/14/2024)   [employer] of Occupational Health - Occupational Stress Questionnaire    Feeling of Stress: To some extent  Social Connections: Moderately Isolated (06/14/2024)   Social Connection and Isolation Panel    Frequency of Communication with Friends and Family: More than three times a week    Frequency of Social Gatherings with Friends and Family: Once a week    Attends Religious Services: Never    Database administrator or Organizations: No    Attends Banker Meetings: Never    Marital Status: Married  Catering manager Violence: Not At Risk (06/14/2024)   Humiliation, Afraid, Rape, and Kick questionnaire    Fear of Current or Ex-Partner: No    Emotionally Abused: No    Physically Abused: No    Sexually Abused: No    Outpatient Medications Prior to Visit  Medication Sig Dispense Refill   Acetylcysteine (N-ACETYL CYSTEINE) 600 MG CAPS Take 1 capsule by mouth daily.     ALPRAZolam (XANAX) 0.5 MG tablet Take 0.25 mg by mouth daily as needed for anxiety.     B-12 Methylcobalamin 1000 MCG TBDP Take 1,000 mcg by mouth daily.     blood glucose meter kit and  supplies KIT Inject 1 each into the skin daily. Check blood glucose once a day. Diagnosis code: E 11.9. 1 each 0   calcium  carbonate (TUMS - DOSED IN MG ELEMENTAL CALCIUM ) 500 MG chewable tablet Chew 1,000 mg by mouth daily as needed for indigestion or heartburn.     Continuous Glucose Receiver (DEXCOM G7 RECEIVER) DEVI 1 each by Does not apply route continuous.     Continuous Glucose Sensor (DEXCOM G7 SENSOR) MISC Use to check glucose continuously, change sensor every 10 days 9 each 3   cyclobenzaprine  (FLEXERIL ) 5 MG tablet Take 1 tablet (5 mg total) by mouth 2 (two) times daily as needed. 30 tablet 1   DULoxetine (CYMBALTA) 60 MG capsule Take 120 mg by mouth every morning. Two capsules (120mg ) daily     gabapentin  (NEURONTIN ) 300 MG capsule Take 1 capsule (300 mg total) by mouth at bedtime. X 1 week then can increase to 600 mg QHS if possible- for FMS 60 capsule 5   glucose blood (ACCU-CHEK GUIDE) test strip 1 each by Other route in the morning, at noon, and at bedtime. Use as instructed 300 each 3   insulin  degludec (TRESIBA  FLEXTOUCH) 200 UNIT/ML FlexTouch Pen Inject 86 Units into the skin 2 (two) times daily. 81 mL 1   Insulin  Degludec FlexTouch 200 UNIT/ML SOPN 86 units qam, 84 units qpm 81 mL 3   insulin  lispro (HUMALOG  KWIKPEN) 100 UNIT/ML KwikPen Inject 1-5 Units into the skin as needed. 15 mL 3   Insulin  Pen Needle (PEN NEEDLES) 32G X 4 MM MISC Check BS 2 x a day 200 each 5   Insulin  Pen Needle (RELION PEN NEEDLES) 31G X 6 MM MISC USE 2 PEN NEEDLEs to check blood glucose twice a day  50 each 5   JARDIANCE  25 MG TABS tablet TAKE 1 TABLET BY MOUTH ONCE DAILY BEFORE BREAKFAST 30 tablet 0   L-Methylfolate 15 MG TABS Take 15 mg by mouth daily at 6 (six) AM.     metFORMIN  (GLUCOPHAGE -XR) 500 MG 24 hr tablet Take 2 tablets (1,000 mg total) by mouth daily with supper. 180 tablet 1   metoprolol  tartrate (LOPRESSOR ) 25 MG tablet Take 1 tablet by mouth twice daily 180 tablet 2   modafinil (PROVIGIL)  100 MG tablet Take 100 mg by mouth every morning.     MOUNJARO  7.5 MG/0.5ML Pen INJECT 7.5 MG INTO THE SKIN ONCE A WEEK 4 mL 0   Multiple Vitamins-Minerals (MULTIVITAMIN WOMEN 50+) TABS Take 1 tablet by mouth daily.     NONFORMULARY OR COMPOUNDED ITEM Take 4 mg by mouth at bedtime. X 2 weeks, then 8 mg nightly- for nerve pain and chronic fatigue- sent in 2 week Rx for 4 mg and 6 months supply for 8 mg- sent to [facility] (Patient taking differently: Take 8 mg by mouth at bedtime. Naltrexone 8mg )     nystatin  powder Apply 1 Application topically 2 (two) times daily. 30 g 2   omega-3 acid ethyl esters (LOVAZA ) 1 g capsule Take 2 capsules (2 g total) by mouth 2 (two) times daily. 120 capsule 5   pantoprazole  (PROTONIX ) 40 MG tablet Take 1 tablet (40 mg total) by mouth daily. 90 tablet 1   potassium chloride  SA (KLOR-CON  M) 20 MEQ tablet Take 2 tablets by mouth twice daily 360 tablet 0   SUMAtriptan  (IMITREX ) 100 MG tablet Take 1 tablet (100 mg total) by mouth every 2 (two) hours as needed for migraine. May repeat in 2 hours if headache persists or recurs. Maximum 2 per day. 10 tablet 2   topiramate  (TOPAMAX ) 25 MG tablet Take 1 tablet (25 mg total) by mouth at bedtime. 30 tablet 6   chlorthalidone  (HYGROTON ) 25 MG tablet Take 1 tablet (25 mg total) by mouth daily. 45 tablet 0   losartan  (COZAAR ) 25 MG tablet Take 1 tablet by mouth once daily 90 tablet 0   rosuvastatin  (CRESTOR ) 5 MG tablet Take 1 tablet (5 mg total) by mouth daily. 90 tablet 1   TRESIBA  FLEXTOUCH 200 UNIT/ML FlexTouch Pen INJECT 170 UNITS SUBCUTANEOUSLY ONCE DAILY 18 mL 0   No facility-administered medications prior to visit.    Allergies  Allergen Reactions   Cephalexin  Itching, Swelling and Other (See Comments)    ROS Review of Systems  Constitutional:  Positive for fatigue. Negative for chills and fever.  HENT:  Positive for congestion and sinus pressure. Negative for ear discharge.   Respiratory:  Negative for  cough, shortness of breath and wheezing.   Cardiovascular:  Negative for chest pain and palpitations.  Gastrointestinal:  Negative for diarrhea and vomiting.  Genitourinary:  Negative for dysuria and hematuria.  Musculoskeletal:  Positive for back pain. Negative for neck pain and neck stiffness.  Skin:  Negative for rash.  Neurological:  Negative for dizziness and weakness.  Psychiatric/Behavioral:  Negative for agitation and behavioral problems.       Objective:    Physical Exam Constitutional:      General: She is not in acute distress.    Appearance: She is obese. She is not diaphoretic.  HENT:     Head: Normocephalic and atraumatic.     Nose: Congestion present.     Mouth/Throat:     Mouth: Mucous membranes are  dry.     Pharynx: No posterior oropharyngeal erythema.   Eyes:     General: No scleral icterus.    Extraocular Movements: Extraocular movements intact.    Cardiovascular:     Rate and Rhythm: Normal rate and regular rhythm.     Heart sounds: Normal heart sounds. No murmur heard. Pulmonary:     Breath sounds: Normal breath sounds. No wheezing or rales.  Abdominal:     Palpations: Abdomen is soft.     Tenderness: There is no abdominal tenderness.   Musculoskeletal:     Cervical back: Neck supple. No tenderness.     Right lower leg: Edema (Trace) present.     Left lower leg: Edema (Trace) present.   Skin:    General: Skin is dry.     Coloration: Skin is not pale.     Findings: No rash.   Neurological:     General: No focal deficit present.     Mental Status: She is alert and oriented to person, place, and time.   Psychiatric:        Mood and Affect: Mood is depressed. Affect is flat.        Behavior: Behavior normal.        Thought Content: Thought content normal.     BP 106/71   Pulse 91   Ht 5' 9 (1.753 m)   Wt 268 lb 6.4 oz (121.7 kg)   LMP  (LMP Unknown)   SpO2 91%   BMI 39.64 kg/m  Wt Readings from Last 3 Encounters:  06/16/24 268 lb  6.4 oz (121.7 kg)  06/14/24 263 lb (119.3 kg)  05/05/24 267 lb 3.2 oz (121.2 kg)    Lab Results  Component Value Date   TSH 1.850 03/25/2024   Lab Results  Component Value Date   WBC 9.3 06/16/2024   HGB 15.4 (H) 06/16/2024   HCT 46.7 (H) 06/16/2024   MCV 94.7 06/16/2024   PLT 263 06/16/2024   Lab Results  Component Value Date   NA 141 03/25/2024   K 3.6 03/25/2024   CO2 22 03/25/2024   GLUCOSE 129 (H) 03/25/2024   BUN 14 03/25/2024   CREATININE 0.92 03/25/2024   BILITOT 0.4 03/25/2024   ALKPHOS 110 03/25/2024   AST 38 03/25/2024   ALT 32 03/25/2024   PROT 6.4 03/25/2024   ALBUMIN 4.1 03/25/2024   CALCIUM  9.5 03/25/2024   ANIONGAP 10 03/15/2022   EGFR 72 03/25/2024   GFR 57.44 (L) 08/11/2023   Lab Results  Component Value Date   CHOL 136 03/25/2024   Lab Results  Component Value Date   HDL 35 (L) 03/25/2024   Lab Results  Component Value Date   LDLCALC 67 03/25/2024   Lab Results  Component Value Date   TRIG 203 (H) 03/25/2024   Lab Results  Component Value Date   CHOLHDL 3.9 03/25/2024   Lab Results  Component Value Date   HGBA1C 6.8 (H) 03/25/2024      Assessment & Plan:   Problem List Items Addressed This Visit       Cardiovascular and Mediastinum   Essential hypertension   BP Readings from Last 1 Encounters:  06/16/24 106/71   Well-controlled with losartan  25 mg QD, Chlorthalidone  12.5 mg QD and Metoprolol  25 mg BID Counseled for compliance with the medications Advised DASH diet and moderate exercise/walking, at least 150 mins/week      Relevant Medications   losartan  (COZAAR ) 25 MG tablet   rosuvastatin  (  CRESTOR ) 5 MG tablet   chlorthalidone  (HYGROTON ) 25 MG tablet     Endocrine   Type 2 diabetes mellitus (HCC)    Lab Results  Component Value Date   HGBA1C 6.8 (H) 03/25/2024   Overall well controlled compared to prior On Jardiance  25 mg QD and Mounjaro  7.5 mg qw On metformin  XR 1000 mg QD and Toujeo  170 units in 2 divided  doses Followed by endocrinology Advised to follow diabetic diet On ARB and statin F/u CMP and lipid panel Diabetic eye exam: Advised to follow up with Ophthalmology for diabetic eye exam      Relevant Medications   losartan  (COZAAR ) 25 MG tablet   rosuvastatin  (CRESTOR ) 5 MG tablet   Other Relevant Orders   Microalbumin / creatinine urine ratio   Diabetic polyneuropathy associated with type 2 diabetes mellitus (HCC)   On Gabapentin  300 mg qHS      Relevant Medications   losartan  (COZAAR ) 25 MG tablet   rosuvastatin  (CRESTOR ) 5 MG tablet     Other   Hyperlipidemia   On Crestor  On Lovaza  for hyper TG      Relevant Medications   losartan  (COZAAR ) 25 MG tablet   rosuvastatin  (CRESTOR ) 5 MG tablet   chlorthalidone  (HYGROTON ) 25 MG tablet   Bipolar depression (HCC)   Uncontrolled On Cymbalta and Modafinil Followed by psychiatry - [facility]      Encounter for general adult medical examination with abnormal findings - Primary   Physical exam as documented. Immunization and cancer screening needs are specifically addressed at this visit.      Other Visit Diagnoses       Leg swelling       Relevant Medications   chlorthalidone  (HYGROTON ) 25 MG tablet     Encounter for immunization       Relevant Orders   Pneumococcal conjugate vaccine 20-valent (Completed)        Meds ordered this encounter  Medications   losartan  (COZAAR ) 25 MG tablet    Sig: Take 1 tablet (25 mg total) by mouth daily.    Dispense:  90 tablet    Refill:  3   rosuvastatin  (CRESTOR ) 5 MG tablet    Sig: Take 1 tablet (5 mg total) by mouth daily.    Dispense:  90 tablet    Refill:  3   chlorthalidone  (HYGROTON ) 25 MG tablet    Sig: Take 0.5 tablets (12.5 mg total) by mouth daily.    Dispense:  45 tablet    Refill:  3    Follow-up: Return in about 6 months (around 12/16/2024) for HTN and DM.    Meldon Sport, MD

## 2024-06-16 NOTE — Patient Instructions (Signed)
 Please continue to take medications as prescribed.  Please continue to follow low carb diet and perform moderate exercise/walking as tolerated.  Please perform Kegel exercises as shown in the material.  Please apply Compound W for callus on foot.

## 2024-06-16 NOTE — Assessment & Plan Note (Signed)
 On Crestor  On Lovaza  for hyper TG

## 2024-06-16 NOTE — Assessment & Plan Note (Signed)
 Uncontrolled On Cymbalta and Modafinil Followed by psychiatry - [facility]

## 2024-06-16 NOTE — Assessment & Plan Note (Signed)
 Physical exam as documented. Immunization and cancer screening needs are specifically addressed at this visit.

## 2024-06-17 ENCOUNTER — Inpatient Hospital Stay

## 2024-06-18 ENCOUNTER — Ambulatory Visit: Payer: Self-pay | Admitting: Internal Medicine

## 2024-06-18 LAB — MICROALBUMIN / CREATININE URINE RATIO
Creatinine, Urine: 51.6 mg/dL
Microalb/Creat Ratio: <6 mg/g{creat} (ref 0–29)
Microalbumin, Urine: <3 ug/mL

## 2024-06-19 ENCOUNTER — Other Ambulatory Visit: Payer: Self-pay | Admitting: Gastroenterology

## 2024-06-19 NOTE — Progress Notes (Deleted)
 Cardiology Office Note   Date:  06/19/2024   ID:  Sydney Taylor, DOB 30-Jun-1966, MRN [id number]  PCP:  Tobie Suzzane MARLA, MD  Cardiologist:   Vina Gull, MD   Pt presents for follow up of palpitations      History of Present Illness: Sydney Taylor is a 58 y.o. female with a history palpitations, OSA, dizziness, DM, bipolar disorder, obesity  Pt has a hx of CP   Has been seen in ER in 2023 Atypical for cardiac  LIpase elevated  Imprved with GI cocktail    I saw the pt in 2023   She had a Ca score CT done   This showed Ca score of 0  There was mild  atherosclerosis   Hepatic steatosis noted as well   Since seen the pt notes an occasional fluttering in chest.  Spells last about 10 to 30 sec   NO dizziness   Not associated with activity.   Has noted more recently  Not too active but joined a senior center   [name] on the treadmill there She says she does have intolerance to heat.   Will get weak  Diet: Breakfast:   Nuts, keto granola cereal and almond milk  1/2 banana    Drink   Coffee with sugar free creamer Lunch:   Varies  Veggie sausages on salads Diner   Variable  Drinks   Water, occasional diet soda  I saw the pt in Aug 2024  No outpatient medications have been marked as taking for the 06/25/24 encounter (Appointment) with Gull Vina GAILS, MD.   h   Allergies:   Cephalexin    Past Medical History:  Diagnosis Date   Anemia    Anxiety    Arrhythmia    Arthritis    Bipolar depression (HCC) 12/28/2019   Cataract    Chronic fatigue    Chronic headaches    Depression    Diabetes mellitus without complication (HCC)    Fibromyalgia    GERD (gastroesophageal reflux disease)    Heart murmur    High cholesterol    HLD (hyperlipidemia)    Hypertension    Insect bite    Migraine    Prediabetes    Sleep apnea    Thyroid  disease    Urinary incontinence    Vasculitis (HCC)     Past Surgical History:  Procedure Laterality Date   BIOPSY  09/04/2022   Procedure:  BIOPSY;  Surgeon: Shaaron Lamar HERO, MD;  [facility];  Service: Endoscopy;;   CHOLECYSTECTOMY N/A 03/15/2022   Procedure: LAPAROSCOPIC CHOLECYSTECTOMY WITH INTRAOPERATIVE CHOLANGIOGRAM;  Surgeon: Kallie Manuelita BROCKS, MD;  Location: [facility];  Service: General;  Laterality: N/A;   CHOLECYSTECTOMY  02/03/2022   COLONOSCOPY  2017   per patient normal and needs next one in 2027 (Dr. Tobie)   COLONOSCOPY WITH PROPOFOL  N/A 09/04/2022   Procedure: COLONOSCOPY WITH PROPOFOL ;  Surgeon: Shaaron Lamar HERO, MD;  [facility];  Service: Endoscopy;  Laterality: N/A;   ESOPHAGOGASTRODUODENOSCOPY N/A 05/12/2020   Procedure: ESOPHAGOGASTRODUODENOSCOPY (EGD);  Surgeon: Shaaron Lamar HERO, MD;  [facility];  Service: Endoscopy;  Laterality: N/A;  11:15am   Joint fusion on foot Right    LIVER BIOPSY N/A 03/15/2022   Procedure: LIVER BIOPSY;  Surgeon: Kallie Manuelita BROCKS, MD;  Location: [facility];  Service: General;  Laterality: N/A;   MALONEY DILATION N/A 05/12/2020   Procedure: AGAPITO DILATION;  Surgeon: Shaaron Lamar HERO, MD;  [facility];  Service: Endoscopy;  Laterality: N/A;   NASAL SEPTUM SURGERY     POLYPECTOMY  09/04/2022   Procedure: POLYPECTOMY;  Surgeon: Shaaron Lamar HERO, MD;  [facility];  Service: Endoscopy;;   SINOSCOPY       Social History:  The patient  reports that she quit smoking about 41 years ago. Her smoking use included cigarettes. She has never used smokeless tobacco. She reports that she does not currently use alcohol. She reports that she does not use drugs.   Family History:  The patient's family history includes Alcohol abuse in her paternal grandfather; Anxiety disorder in her father, maternal aunt, mother, and paternal grandmother; Arthritis in her maternal grandmother and mother; Autoimmune disease in her brother; Cancer in her maternal aunt; Congestive Heart Failure in her mother; Depression in her father, maternal aunt, and paternal  grandmother; Diabetes in her father, maternal grandmother, mother, paternal uncle, and sister; Diabetes Mellitus II in her mother; Hearing loss in her father; Heart attack in her mother; Heart disease in her father, maternal grandfather, mother, and paternal grandmother; Hyperlipidemia in her father, mother, and sister; Hypertension in her brother, father, mother, and sister; Lupus in her paternal grandmother; Obesity in her father, maternal grandmother, mother, and sister; Stroke in her father and maternal grandfather; Varicose Veins in her mother; Vision loss in her brother.    ROS:  Please see the history of present illness. All other systems are reviewed and  Negative to the above problem except as noted.    PHYSICAL EXAM: VS:  LMP  (LMP Unknown)   GEN: Morbidly obese 58 yo  in no acute distress  HEENT: normal  Neck: no JVD, carotid bruit Cardiac: RRR; no murmurs  NO LE  edema  Respiratory:  clear to auscultation  GI: No obvious masses Nontender   MS: no deformity Moving all extremities   Skin: warm and dry, no rash  EKG:  EKG is not ordered today.    Lipid Panel    Component Value Date/Time   CHOL 136 03/25/2024 1643   TRIG 203 (H) 03/25/2024 1643   HDL 35 (L) 03/25/2024 1643   CHOLHDL 3.9 03/25/2024 1643   CHOLHDL 4 04/29/2023 1435   VLDL 59.8 (H) 04/29/2023 1435   LDLCALC 67 03/25/2024 1643   LDLCALC 133 (H) 01/21/2020 1009   LDLDIRECT 66.0 04/29/2023 1435      Wt Readings from Last 3 Encounters:  06/16/24 268 lb 6.4 oz (121.7 kg)  06/14/24 263 lb (119.3 kg)  05/05/24 267 lb 3.2 oz (121.2 kg)      ASSESSMENT AND PLAN:  1  Chest pain   Atypical pain in past   More consistent with GI (pancreatitis)  2  Tachycardia    She had been tachycardic in the past Notes brief spells now    3  Hx HTN   She has some weakness at times   I told her she could take metoprolol  at night only   Also could cut back on chlorthalidone    Will need to follow BP and symptoms closely  Resume  if BP increases    4   OSA   Pt had been on CPAP    5   Morbid obesity   Discussed diet   Limit carbs, low processed food    Encouraged her to stay active   COnsider starting back on Silver sneakers They have remote sessions. F/U in spring   Current medicines are reviewed at  length with the patient today.  The patient does not have concerns regarding medicines.  Signed, Vina Gull, MD  06/19/2024 10:56 PM    [facility] [address] [place], [place], [place]  [address] Phone: [phone]; Fax: [phone]

## 2024-06-21 NOTE — Progress Notes (Deleted)
 [facility] [address], [place] [address]   CLINIC:  Medical Oncology/Hematology  PCP:  Tobie Suzzane POUR, MD [address] [place] [address] [phone]   REASON FOR VISIT:  Follow-up for ***  PRIOR THERAPY: ***  CURRENT THERAPY: ***  INTERVAL HISTORY:   Sydney Taylor 58 y.o. female returns for routine follow-up of ***  At today's visit, she reports feeling ***.  No recent hospitalizations, surgeries, or changes in baseline health status.  *** No fatigue, fever, chills, shortness of breath, cough, chest pain, nausea, vomiting, abdominal pain.  Denies any signs or symptoms of blood loss.  No current signs or symptoms of blood clots.  She has ***% energy and ***% appetite. She endorses that she is maintaining a stable weight.   ASSESSMENT & PLAN:  1.  *** - *** - PLAN: ***  2.  *** - *** - PLAN: ***  PLAN SUMMARY: >> *** >> *** >> ***   [facility] **VISIT SUMMARY & IMPORTANT INSTRUCTIONS **   You were seen today by Pleasant Barefoot PA-C for your ***.    *** ***  *** ***  LABS: Return in ***   OTHER TESTS: ***  MEDICATIONS: ***  FOLLOW-UP APPOINTMENT: ***     REVIEW OF SYSTEMS: ***  Review of Systems - Oncology   PHYSICAL EXAM:  ECOG PERFORMANCE STATUS: {CHL ONC ECOG ED:[id number]} *** There were no vitals filed for this visit. There were no vitals filed for this visit. Physical Exam  PAST MEDICAL/SURGICAL HISTORY:  Past Medical History:  Diagnosis Date   Anemia    Anxiety    Arrhythmia    Arthritis    Bipolar depression (HCC) 12/28/2019   Cataract    Chronic fatigue    Chronic headaches    Depression    Diabetes mellitus without complication (HCC)    Fibromyalgia    GERD (gastroesophageal reflux disease)    Heart murmur    High cholesterol    HLD (hyperlipidemia)    Hypertension    Insect bite    Migraine    Prediabetes    Sleep apnea    Thyroid  disease     Urinary incontinence    Vasculitis (HCC)    Past Surgical History:  Procedure Laterality Date   BIOPSY  09/04/2022   Procedure: BIOPSY;  Surgeon: Shaaron Lamar HERO, MD;  [facility];  Service: Endoscopy;;   CHOLECYSTECTOMY N/A 03/15/2022   Procedure: LAPAROSCOPIC CHOLECYSTECTOMY WITH INTRAOPERATIVE CHOLANGIOGRAM;  Surgeon: Kallie Manuelita BROCKS, MD;  [facility]: [facility];  Service: General;  Laterality: N/A;   CHOLECYSTECTOMY  02/03/2022   COLONOSCOPY  2017   per patient normal and needs next one in 2027 (Dr. Tobie)   COLONOSCOPY WITH PROPOFOL  N/A 09/04/2022   Procedure: COLONOSCOPY WITH PROPOFOL ;  Surgeon: Shaaron Lamar HERO, MD;  [facility];  Service: Endoscopy;  Laterality: N/A;   ESOPHAGOGASTRODUODENOSCOPY N/A 05/12/2020   Procedure: ESOPHAGOGASTRODUODENOSCOPY (EGD);  Surgeon: Shaaron Lamar HERO, MD;  [facility];  Service: Endoscopy;  Laterality: N/A;  11:15am   Joint fusion on foot Right    LIVER BIOPSY N/A 03/15/2022   Procedure: LIVER BIOPSY;  Surgeon: Kallie Manuelita BROCKS, MD;  [facility]: [facility];  Service: General;  Laterality: N/A;   MALONEY DILATION N/A 05/12/2020   Procedure: AGAPITO HODGKIN;  Surgeon: Shaaron Lamar HERO, MD;  [facility];  Service: Endoscopy;  Laterality: N/A;   NASAL SEPTUM  SURGERY     POLYPECTOMY  09/04/2022   Procedure: POLYPECTOMY;  Surgeon: Shaaron Lamar HERO, MD;  [facility];  Service: Endoscopy;;   SINOSCOPY      SOCIAL HISTORY:  Social History   Socioeconomic History   Marital status: Married    Spouse name: Not on file   Number of children: 0   Years of education: Not on file   Highest education level: Bachelor's degree (e.g., BA, AB, BS)  Occupational History   Occupation: research associate/drug development  Tobacco Use   Smoking status: Former    Current packs/day: 0.00    Types: Cigarettes    Quit date: 12/30/1982    Years since quitting: 41.5   Smokeless tobacco: Never  Vaping Use    Vaping status: Never Used  Substance and Sexual Activity   Alcohol use: Not Currently    Alcohol/week: 0.0 standard drinks of alcohol   Drug use: Never   Sexual activity: Yes    Birth control/protection: Post-menopausal  Other Topics Concern   Not on file  Social History Narrative   Married for 3 years.Lives with husband.Bachelors in Building surveyor.   Right handed   Caffeine: 1-2 per day    Social Drivers of Health   Financial Resource Strain: Low Risk  (06/14/2024)   Overall Financial Resource Strain (CARDIA)    Difficulty of Paying Living Expenses: Not hard at all  Food Insecurity: No Food Insecurity (06/14/2024)   Hunger Vital Sign    Worried About Running Out of Food in the Last Year: Never true    Ran Out of Food in the Last Year: Never true  Transportation Needs: No Transportation Needs (06/14/2024)   PRAPARE - Administrator, Civil Service (Medical): No    Lack of Transportation (Non-Medical): No  Physical Activity: Inactive (06/14/2024)   Exercise Vital Sign    Days of Exercise per Week: 0 days    Minutes of Exercise per Session: 0 min  Stress: Stress Concern Present (06/14/2024)   [employer] of Occupational Health - Occupational Stress Questionnaire    Feeling of Stress: To some extent  Social Connections: Moderately Isolated (06/14/2024)   Social Connection and Isolation Panel    Frequency of Communication with Friends and Family: More than three times a week    Frequency of Social Gatherings with Friends and Family: Once a week    Attends Religious Services: Never    Database administrator or Organizations: No    Attends Banker Meetings: Never    Marital Status: Married  Catering manager Violence: Not At Risk (06/14/2024)   Humiliation, Afraid, Rape, and Kick questionnaire    Fear of Current or Ex-Partner: No    Emotionally Abused: No    Physically Abused: No    Sexually Abused: No    FAMILY HISTORY:  Family History  Problem Relation  Age of Onset   Hypertension Father    Hyperlipidemia Father    Diabetes Father    Stroke Father    Depression Father    Anxiety disorder Father    Hearing loss Father    Heart disease Father    Obesity Father    Hypertension Mother    Hyperlipidemia Mother    Diabetes Mellitus II Mother    Congestive Heart Failure Mother    Heart attack Mother    Anxiety disorder Mother    Arthritis Mother    Diabetes Mother    Heart disease Mother  Obesity Mother    Varicose Veins Mother    Hypertension Sister    Diabetes Sister    Hyperlipidemia Sister    Obesity Sister    Hypertension Brother    Autoimmune disease Brother    Vision loss Brother    Diabetes Maternal Grandmother    Arthritis Maternal Grandmother    Obesity Maternal Grandmother    Heart disease Maternal Grandfather    Stroke Maternal Grandfather    Lupus Paternal Grandmother    Depression Paternal Grandmother    Anxiety disorder Paternal Grandmother    Heart disease Paternal Grandmother    Alcohol abuse Paternal Grandfather    Depression Maternal Aunt    Anxiety disorder Maternal Aunt    Cancer Maternal Aunt    Diabetes Paternal Uncle    Colon cancer Neg Hx    Esophageal cancer Neg Hx    Gastric cancer Neg Hx     CURRENT MEDICATIONS:  Outpatient Encounter Medications as of 06/22/2024  Medication Sig   Acetylcysteine (N-ACETYL CYSTEINE) 600 MG CAPS Take 1 capsule by mouth daily.   ALPRAZolam (XANAX) 0.5 MG tablet Take 0.25 mg by mouth daily as needed for anxiety.   B-12 Methylcobalamin 1000 MCG TBDP Take 1,000 mcg by mouth daily.   blood glucose meter kit and supplies KIT Inject 1 each into the skin daily. Check blood glucose once a day. Diagnosis code: E 11.9.   calcium  carbonate (TUMS - DOSED IN MG ELEMENTAL CALCIUM ) 500 MG chewable tablet Chew 1,000 mg by mouth daily as needed for indigestion or heartburn.   chlorthalidone  (HYGROTON ) 25 MG tablet Take 0.5 tablets (12.5 mg total) by mouth daily.   Continuous  Glucose Receiver (DEXCOM G7 RECEIVER) DEVI 1 each by Does not apply route continuous.   Continuous Glucose Sensor (DEXCOM G7 SENSOR) MISC Use to check glucose continuously, change sensor every 10 days   cyclobenzaprine  (FLEXERIL ) 5 MG tablet Take 1 tablet (5 mg total) by mouth 2 (two) times daily as needed.   DULoxetine (CYMBALTA) 60 MG capsule Take 120 mg by mouth every morning. Two capsules (120mg ) daily   gabapentin  (NEURONTIN ) 300 MG capsule Take 1 capsule (300 mg total) by mouth at bedtime. X 1 week then can increase to 600 mg QHS if possible- for FMS   glucose blood (ACCU-CHEK GUIDE) test strip 1 each by Other route in the morning, at noon, and at bedtime. Use as instructed   insulin  degludec (TRESIBA  FLEXTOUCH) 200 UNIT/ML FlexTouch Pen Inject 86 Units into the skin 2 (two) times daily.   Insulin  Degludec FlexTouch 200 UNIT/ML SOPN 86 units qam, 84 units qpm   insulin  lispro (HUMALOG  KWIKPEN) 100 UNIT/ML KwikPen Inject 1-5 Units into the skin as needed.   Insulin  Pen Needle (PEN NEEDLES) 32G X 4 MM MISC Check BS 2 x a day   Insulin  Pen Needle (RELION PEN NEEDLES) 31G X 6 MM MISC USE 2 PEN NEEDLEs to check blood glucose twice a day   JARDIANCE  25 MG TABS tablet TAKE 1 TABLET BY MOUTH ONCE DAILY BEFORE BREAKFAST   L-Methylfolate 15 MG TABS Take 15 mg by mouth daily at 6 (six) AM.   losartan  (COZAAR ) 25 MG tablet Take 1 tablet (25 mg total) by mouth daily.   metFORMIN  (GLUCOPHAGE -XR) 500 MG 24 hr tablet Take 2 tablets (1,000 mg total) by mouth daily with supper.   metoprolol  tartrate (LOPRESSOR ) 25 MG tablet Take 1 tablet by mouth twice daily   modafinil (PROVIGIL) 100 MG tablet Take 100  mg by mouth every morning.   MOUNJARO  7.5 MG/0.5ML Pen INJECT 7.5 MG INTO THE SKIN ONCE A WEEK   Multiple Vitamins-Minerals (MULTIVITAMIN WOMEN 50+) TABS Take 1 tablet by mouth daily.   NONFORMULARY OR COMPOUNDED ITEM Take 4 mg by mouth at bedtime. X 2 weeks, then 8 mg nightly- for nerve pain and chronic  fatigue- sent in 2 week Rx for 4 mg and 6 months supply for 8 mg- sent to [facility] (Patient taking differently: Take 8 mg by mouth at bedtime. Naltrexone 8mg )   nystatin  powder Apply 1 Application topically 2 (two) times daily.   omega-3 acid ethyl esters (LOVAZA ) 1 g capsule Take 2 capsules (2 g total) by mouth 2 (two) times daily.   pantoprazole  (PROTONIX ) 40 MG tablet Take 1 tablet (40 mg total) by mouth daily.   potassium chloride  SA (KLOR-CON  M) 20 MEQ tablet Take 2 tablets by mouth twice daily   rosuvastatin  (CRESTOR ) 5 MG tablet Take 1 tablet (5 mg total) by mouth daily.   SUMAtriptan  (IMITREX ) 100 MG tablet Take 1 tablet (100 mg total) by mouth every 2 (two) hours as needed for migraine. May repeat in 2 hours if headache persists or recurs. Maximum 2 per day.   topiramate  (TOPAMAX ) 25 MG tablet Take 1 tablet (25 mg total) by mouth at bedtime.   No facility-administered encounter medications on file as of 06/22/2024.    ALLERGIES:  Allergies  Allergen Reactions   Cephalexin  Itching, Swelling and Other (See Comments)    LABORATORY DATA:  I have reviewed the labs as listed.  CBC    Component Value Date/Time   WBC 9.3 06/16/2024 1137   RBC 4.93 06/16/2024 1137   HGB 15.4 (H) 06/16/2024 1137   HGB 14.1 11/24/2023 1538   HCT 46.7 (H) 06/16/2024 1137   HCT 45.3 11/24/2023 1538   PLT 263 06/16/2024 1137   PLT 389 11/24/2023 1538   MCV 94.7 06/16/2024 1137   MCV 84 11/24/2023 1538   MCH 31.2 06/16/2024 1137   MCHC 33.0 06/16/2024 1137   RDW 13.2 06/16/2024 1137   RDW 15.8 (H) 11/24/2023 1538   LYMPHSABS 1.9 06/16/2024 1137   LYMPHSABS 2.3 11/24/2023 1538   MONOABS 0.8 06/16/2024 1137   EOSABS 0.5 06/16/2024 1137   EOSABS 0.4 11/24/2023 1538   BASOSABS 0.1 06/16/2024 1137   BASOSABS 0.1 11/24/2023 1538      Latest Ref Rng & Units 03/25/2024    4:43 PM 11/24/2023    3:38 PM 09/19/2023   10:49 AM  CMP  Glucose 70 - 99 mg/dL 870  98  764   BUN 6 - 24 mg/dL 14   15  20    Creatinine 0.57 - 1.00 mg/dL 9.07  8.95  8.93   Sodium 134 - 144 mmol/L 141  141  142   Potassium 3.5 - 5.2 mmol/L 3.6  4.5  4.3   Chloride 96 - 106 mmol/L 101  100  102   CO2 20 - 29 mmol/L 22  23  19    Calcium  8.7 - 10.2 mg/dL 9.5  9.8  9.4   Total Protein 6.0 - 8.5 g/dL 6.4   6.2   Total Bilirubin 0.0 - 1.2 mg/dL 0.4   0.3   Alkaline Phos 44 - 121 IU/L 110   103   AST 0 - 40 IU/L 38   28   ALT 0 - 32 IU/L 32   29     DIAGNOSTIC IMAGING:  I  have independently reviewed the relevant imaging and discussed with the patient.   WRAP UP:  All questions were answered. The patient knows to call the clinic with any problems, questions or concerns.  Medical decision making: ***  Time spent on visit: I spent *** minutes counseling the patient face to face. The total time spent in the appointment was *** minutes and more than 50% was on counseling.  Pleasant CHRISTELLA Barefoot, PA-C  ***

## 2024-06-22 ENCOUNTER — Inpatient Hospital Stay: Payer: Medicare Other | Admitting: Physician Assistant

## 2024-06-25 ENCOUNTER — Ambulatory Visit: Admitting: Internal Medicine

## 2024-06-29 NOTE — Progress Notes (Unsigned)
 [facility] [address], [place] [address]   CLINIC:  Medical Oncology/Hematology  PCP:  Tobie Suzzane POUR, MD [address] [place] [address] [phone]   REASON FOR VISIT:  Follow-up for iron  deficiency anemia  PRIOR THERAPY: Oral iron  (unable to tolerate due to stomach pain)  CURRENT THERAPY: Intermittent IV iron   INTERVAL HISTORY:   Sydney Taylor 58 y.o. female returns for routine follow-up of iron  deficiency state.  She was last seen by Dr. Rogers on 12/16/2023, and received INFeD  x 1 g on 01/02/2024.  At today's visit, she reports feeling fair.   No recent hospitalizations, surgeries, or changes in baseline health status. She tolerated INFeD  very well in January 2025. After receiving INFeD , she noticed improvement in weakness, palpitations, and shortness of breath. She has chronic fatigue from fibromyalgia, but did note some slight improvement in her energy. She reports fingernail peeling, that she associates with iron  deficiency. She has chronic migraine headaches. She denies any dyspnea, chest pain, lightheadedness, syncope, pica. No rectal bleeding or melena. She has 25% energy and 100% appetite. She endorses that she is maintaining a stable weight.  ASSESSMENT & PLAN:  1.  Iron  deficiency state: - She was seen at the request of Dr. Tobie.  Labs from PCP (11/24/2023) with ferritin 20/iron  saturation 9%, normal hemoglobin 14.1. - Unable to tolerate oral iron  due to stomach pain. - No prior history of PRBC transfusion. - Colonoscopy on 09/04/2022 colon polyp x 1, otherwise normal colon. - Received INFeD  x 1 g on 01/02/2024 due to symptomatic iron  deficiency (extreme fatigue with ferritin <100) - Some persistent fatigue following IV iron , unclear if this is due to iron  deficiency or fibromyalgia. - She denies any BRBPR/melena.   No B symptoms. - Most recent labs (06/16/2024): Hgb 15.4/MCV 94.7 ferritin 62, iron  saturation 18%. - PLAN:  Patient would like to proceed with second dose of IV INFeD  due to ongoing fatigue with ferritin <100. - Mild erythrocytosis is noted.  Patient has history of OSA (compliant with CPAP).  Reports that she is not very good about drinking water.  Will consider additional testing if she has persistent erythrocytosis at next visit. - Also recommend trial of ferrous bisglycinate to see if we can reduce her need for IV iron  in the future. - Repeat labs and RTC in 6 months.  2.  Social/Family History: -- Retired. No tobacco use. Independent of ADL's and IADL's.  - No family history of anemia or thalassemia. Maternal aunt had breast cancer. No other family history of cancer.  PLAN SUMMARY: >> INFeD  x 1 >> Labs in 6 months = CBC/D, ferritin, iron /TIBC, B12, MMA     REVIEW OF SYSTEMS: Fibromyalgia  Review of Systems  Constitutional:  Positive for fatigue. Negative for appetite change, chills, diaphoresis, fever and unexpected weight change.  HENT:   Negative for lump/mass and nosebleeds.   Eyes:  Negative for eye problems.  Respiratory:  Negative for cough, hemoptysis and shortness of breath.   Cardiovascular:  Negative for chest pain, leg swelling and palpitations.  Gastrointestinal:  Positive for constipation. Negative for abdominal pain, blood in stool, diarrhea, nausea and vomiting.  Genitourinary:  Negative for hematuria.   Musculoskeletal:  Positive for myalgias.  Skin: Negative.        Peeling fingernails.  Neurological:  Positive for headaches. Negative for dizziness and light-headedness.  Hematological:  Does not bruise/bleed easily.     PHYSICAL EXAM:  ECOG PERFORMANCE STATUS: 1 -  Symptomatic but completely ambulatory  Vitals:   06/30/24 0938  BP: 115/73  Pulse: 88  Resp: 20  Temp: 99.1 F (37.3 C)  SpO2: 94%   Filed Weights   06/30/24 0938  Weight: 265 lb 14 oz (120.6 kg)   Physical Exam Constitutional:      Appearance: Normal appearance. She is obese.   Cardiovascular:     Heart sounds: Normal heart sounds.  Pulmonary:     Breath sounds: Normal breath sounds.  Neurological:     General: No focal deficit present.     Mental Status: Mental status is at baseline.  Psychiatric:        Behavior: Behavior normal. Behavior is cooperative.     PAST MEDICAL/SURGICAL HISTORY:  Past Medical History:  Diagnosis Date   Anemia    Anxiety    Arrhythmia    Arthritis    Bipolar depression (HCC) 12/28/2019   Cataract    Chronic fatigue    Chronic headaches    Depression    Diabetes mellitus without complication (HCC)    Fibromyalgia    GERD (gastroesophageal reflux disease)    Heart murmur    High cholesterol    HLD (hyperlipidemia)    Hypertension    Insect bite    Migraine    Prediabetes    Sleep apnea    Thyroid  disease    Urinary incontinence    Vasculitis (HCC)    Past Surgical History:  Procedure Laterality Date   BIOPSY  09/04/2022   Procedure: BIOPSY;  Surgeon: Shaaron Lamar HERO, MD;  [facility]: [facility];  Service: Endoscopy;;   CHOLECYSTECTOMY N/A 03/15/2022   Procedure: LAPAROSCOPIC CHOLECYSTECTOMY WITH INTRAOPERATIVE CHOLANGIOGRAM;  Surgeon: Kallie Manuelita BROCKS, MD;  Location: [facility];  Service: General;  Laterality: N/A;   CHOLECYSTECTOMY  02/03/2022   COLONOSCOPY  2017   per patient normal and needs next one in 2027 (Dr. Tobie)   COLONOSCOPY WITH PROPOFOL  N/A 09/04/2022   Procedure: COLONOSCOPY WITH PROPOFOL ;  Surgeon: Shaaron Lamar HERO, MD;  [facility]: [facility];  Service: Endoscopy;  Laterality: N/A;   ESOPHAGOGASTRODUODENOSCOPY N/A 05/12/2020   Procedure: ESOPHAGOGASTRODUODENOSCOPY (EGD);  Surgeon: Shaaron Lamar HERO, MD;  [facility]: [facility];  Service: Endoscopy;  Laterality: N/A;  11:15am   Joint fusion on foot Right    LIVER BIOPSY N/A 03/15/2022   Procedure: LIVER BIOPSY;  Surgeon: Kallie Manuelita BROCKS, MD;  Location: [facility];  Service: General;  Laterality: N/A;   MALONEY DILATION N/A 05/12/2020    Procedure: AGAPITO HODGKIN;  Surgeon: Shaaron Lamar HERO, MD;  [facility]: [facility];  Service: Endoscopy;  Laterality: N/A;   NASAL SEPTUM SURGERY     POLYPECTOMY  09/04/2022   Procedure: POLYPECTOMY;  Surgeon: Shaaron Lamar HERO, MD;  [facility]: [facility];  Service: Endoscopy;;   SINOSCOPY      SOCIAL HISTORY:  Social History   Socioeconomic History   Marital status: Married    Spouse name: Not on file   Number of children: 0   Years of education: Not on file   Highest education level: Bachelor's degree (e.g., BA, AB, BS)  Occupational History   Occupation: research associate/drug development  Tobacco Use   Smoking status: Former    Current packs/day: 0.00    Types: Cigarettes    Quit date: 12/30/1982    Years since quitting: 41.5   Smokeless tobacco: Never  Vaping Use   Vaping status: Never Used  Substance and Sexual Activity   Alcohol  use: Not Currently    Alcohol/week: 0.0 standard drinks of alcohol   Drug use: Never   Sexual activity: Yes    Birth control/protection: Post-menopausal  Other Topics Concern   Not on file  Social History Narrative   Married for 3 years.Lives with husband.Bachelors in Building surveyor.   Right handed   Caffeine: 1-2 per day    Social Drivers of Health   Financial Resource Strain: Low Risk  (06/14/2024)   Overall Financial Resource Strain (CARDIA)    Difficulty of Paying Living Expenses: Not hard at all  Food Insecurity: No Food Insecurity (06/14/2024)   Hunger Vital Sign    Worried About Running Out of Food in the Last Year: Never true    Ran Out of Food in the Last Year: Never true  Transportation Needs: No Transportation Needs (06/14/2024)   PRAPARE - Administrator, Civil Service (Medical): No    Lack of Transportation (Non-Medical): No  Physical Activity: Inactive (06/14/2024)   Exercise Vital Sign    Days of Exercise per Week: 0 days    Minutes of Exercise per Session: 0 min  Stress: Stress Concern Present (06/14/2024)    [employer] of Occupational Health - Occupational Stress Questionnaire    Feeling of Stress: To some extent  Social Connections: Moderately Isolated (06/14/2024)   Social Connection and Isolation Panel    Frequency of Communication with Friends and Family: More than three times a week    Frequency of Social Gatherings with Friends and Family: Once a week    Attends Religious Services: Never    Database administrator or Organizations: No    Attends Banker Meetings: Never    Marital Status: Married  Catering manager Violence: Not At Risk (06/14/2024)   Humiliation, Afraid, Rape, and Kick questionnaire    Fear of Current or Ex-Partner: No    Emotionally Abused: No    Physically Abused: No    Sexually Abused: No    FAMILY HISTORY:  Family History  Problem Relation Age of Onset   Hypertension Father    Hyperlipidemia Father    Diabetes Father    Stroke Father    Depression Father    Anxiety disorder Father    Hearing loss Father    Heart disease Father    Obesity Father    Hypertension Mother    Hyperlipidemia Mother    Diabetes Mellitus II Mother    Congestive Heart Failure Mother    Heart attack Mother    Anxiety disorder Mother    Arthritis Mother    Diabetes Mother    Heart disease Mother    Obesity Mother    Varicose Veins Mother    Hypertension Sister    Diabetes Sister    Hyperlipidemia Sister    Obesity Sister    Hypertension Brother    Autoimmune disease Brother    Vision loss Brother    Diabetes Maternal Grandmother    Arthritis Maternal Grandmother    Obesity Maternal Grandmother    Heart disease Maternal Grandfather    Stroke Maternal Grandfather    Lupus Paternal Grandmother    Depression Paternal Grandmother    Anxiety disorder Paternal Grandmother    Heart disease Paternal Grandmother    Alcohol abuse Paternal Grandfather    Depression Maternal Aunt    Anxiety disorder Maternal Aunt    Cancer Maternal Aunt    Diabetes Paternal  Uncle    Colon cancer Neg Hx  Esophageal cancer Neg Hx    Gastric cancer Neg Hx     CURRENT MEDICATIONS:  Outpatient Encounter Medications as of 06/30/2024  Medication Sig   Acetylcysteine (N-ACETYL CYSTEINE) 600 MG CAPS Take 1 capsule by mouth daily.   ALPRAZolam (XANAX) 0.5 MG tablet Take 0.25 mg by mouth daily as needed for anxiety.   ARIPiprazole (ABILIFY) 2 MG tablet Take 2 mg by mouth daily.   B-12 Methylcobalamin 1000 MCG TBDP Take 1,000 mcg by mouth daily.   blood glucose meter kit and supplies KIT Inject 1 each into the skin daily. Check blood glucose once a day. Diagnosis code: E 11.9.   calcium  carbonate (TUMS - DOSED IN MG ELEMENTAL CALCIUM ) 500 MG chewable tablet Chew 1,000 mg by mouth daily as needed for indigestion or heartburn.   chlorthalidone  (HYGROTON ) 25 MG tablet Take 0.5 tablets (12.5 mg total) by mouth daily.   Continuous Glucose Receiver (DEXCOM G7 RECEIVER) DEVI 1 each by Does not apply route continuous.   Continuous Glucose Sensor (DEXCOM G7 SENSOR) MISC Use to check glucose continuously, change sensor every 10 days   cyclobenzaprine  (FLEXERIL ) 5 MG tablet Take 1 tablet (5 mg total) by mouth 2 (two) times daily as needed.   DULoxetine (CYMBALTA) 60 MG capsule Take 120 mg by mouth every morning. Two capsules (120mg ) daily   gabapentin  (NEURONTIN ) 300 MG capsule Take 1 capsule (300 mg total) by mouth at bedtime. X 1 week then can increase to 600 mg QHS if possible- for FMS   glucose blood (ACCU-CHEK GUIDE) test strip 1 each by Other route in the morning, at noon, and at bedtime. Use as instructed   insulin  degludec (TRESIBA  FLEXTOUCH) 200 UNIT/ML FlexTouch Pen Inject 86 Units into the skin 2 (two) times daily.   Insulin  Degludec FlexTouch 200 UNIT/ML SOPN 86 units qam, 84 units qpm   insulin  lispro (HUMALOG  KWIKPEN) 100 UNIT/ML KwikPen Inject 1-5 Units into the skin as needed.   Insulin  Pen Needle (PEN NEEDLES) 32G X 4 MM MISC Check BS 2 x a day   Insulin  Pen Needle  (RELION PEN NEEDLES) 31G X 6 MM MISC USE 2 PEN NEEDLEs to check blood glucose twice a day   JARDIANCE  25 MG TABS tablet TAKE 1 TABLET BY MOUTH ONCE DAILY BEFORE BREAKFAST   L-Methylfolate 15 MG TABS Take 15 mg by mouth daily at 6 (six) AM.   losartan  (COZAAR ) 25 MG tablet Take 1 tablet (25 mg total) by mouth daily.   metFORMIN  (GLUCOPHAGE -XR) 500 MG 24 hr tablet Take 2 tablets (1,000 mg total) by mouth daily with supper.   metoprolol  tartrate (LOPRESSOR ) 25 MG tablet Take 1 tablet by mouth twice daily   modafinil (PROVIGIL) 100 MG tablet Take 100 mg by mouth every morning.   MOUNJARO  7.5 MG/0.5ML Pen INJECT 7.5 MG INTO THE SKIN ONCE A WEEK   Multiple Vitamins-Minerals (MULTIVITAMIN WOMEN 50+) TABS Take 1 tablet by mouth daily.   NONFORMULARY OR COMPOUNDED ITEM Take 4 mg by mouth at bedtime. X 2 weeks, then 8 mg nightly- for nerve pain and chronic fatigue- sent in 2 week Rx for 4 mg and 6 months supply for 8 mg- sent to [facility] (Patient taking differently: Take 8 mg by mouth at bedtime. Naltrexone 8mg )   nystatin  powder Apply 1 Application topically 2 (two) times daily.   omega-3 acid ethyl esters (LOVAZA ) 1 g capsule Take 2 capsules (2 g total) by mouth 2 (two) times daily.   pantoprazole  (PROTONIX ) 40  MG tablet Take 1 tablet by mouth once daily   potassium chloride  SA (KLOR-CON  M) 20 MEQ tablet Take 2 tablets by mouth twice daily   rosuvastatin  (CRESTOR ) 5 MG tablet Take 1 tablet (5 mg total) by mouth daily.   SUMAtriptan  (IMITREX ) 100 MG tablet Take 1 tablet (100 mg total) by mouth every 2 (two) hours as needed for migraine. May repeat in 2 hours if headache persists or recurs. Maximum 2 per day.   topiramate  (TOPAMAX ) 25 MG tablet Take 1 tablet (25 mg total) by mouth at bedtime.   No facility-administered encounter medications on file as of 06/30/2024.    ALLERGIES:  Allergies  Allergen Reactions   Cephalexin  Itching, Swelling and Other (See Comments)    LABORATORY DATA:  I  have reviewed the labs as listed.  CBC    Component Value Date/Time   WBC 9.3 06/16/2024 1137   RBC 4.93 06/16/2024 1137   HGB 15.4 (H) 06/16/2024 1137   HGB 14.1 11/24/2023 1538   HCT 46.7 (H) 06/16/2024 1137   HCT 45.3 11/24/2023 1538   PLT 263 06/16/2024 1137   PLT 389 11/24/2023 1538   MCV 94.7 06/16/2024 1137   MCV 84 11/24/2023 1538   MCH 31.2 06/16/2024 1137   MCHC 33.0 06/16/2024 1137   RDW 13.2 06/16/2024 1137   RDW 15.8 (H) 11/24/2023 1538   LYMPHSABS 1.9 06/16/2024 1137   LYMPHSABS 2.3 11/24/2023 1538   MONOABS 0.8 06/16/2024 1137   EOSABS 0.5 06/16/2024 1137   EOSABS 0.4 11/24/2023 1538   BASOSABS 0.1 06/16/2024 1137   BASOSABS 0.1 11/24/2023 1538      Latest Ref Rng & Units 03/25/2024    4:43 PM 11/24/2023    3:38 PM 09/19/2023   10:49 AM  CMP  Glucose 70 - 99 mg/dL 870  98  764   BUN 6 - 24 mg/dL 14  15  20    Creatinine 0.57 - 1.00 mg/dL 9.07  8.95  8.93   Sodium 134 - 144 mmol/L 141  141  142   Potassium 3.5 - 5.2 mmol/L 3.6  4.5  4.3   Chloride 96 - 106 mmol/L 101  100  102   CO2 20 - 29 mmol/L 22  23  19    Calcium  8.7 - 10.2 mg/dL 9.5  9.8  9.4   Total Protein 6.0 - 8.5 g/dL 6.4   6.2   Total Bilirubin 0.0 - 1.2 mg/dL 0.4   0.3   Alkaline Phos 44 - 121 IU/L 110   103   AST 0 - 40 IU/L 38   28   ALT 0 - 32 IU/L 32   29     DIAGNOSTIC IMAGING:  I have independently reviewed the relevant imaging and discussed with the patient.   WRAP UP:  All questions were answered. The patient knows to call the clinic with any problems, questions or concerns.  Medical decision making: Moderate  Time spent on visit: I spent 20 minutes counseling the patient face to face. The total time spent in the appointment was 30 minutes and more than 50% was on counseling.  Pleasant Sydney Barefoot, PA-C  06/30/24 10:13 AM

## 2024-06-30 ENCOUNTER — Inpatient Hospital Stay: Attending: Hematology | Admitting: Physician Assistant

## 2024-06-30 VITALS — BP 115/73 | HR 88 | Temp 99.1°F | Resp 20 | Wt 265.9 lb

## 2024-06-30 DIAGNOSIS — Z803 Family history of malignant neoplasm of breast: Secondary | ICD-10-CM | POA: Insufficient documentation

## 2024-06-30 DIAGNOSIS — G4733 Obstructive sleep apnea (adult) (pediatric): Secondary | ICD-10-CM | POA: Insufficient documentation

## 2024-06-30 DIAGNOSIS — D509 Iron deficiency anemia, unspecified: Secondary | ICD-10-CM | POA: Diagnosis present

## 2024-06-30 DIAGNOSIS — Z87891 Personal history of nicotine dependence: Secondary | ICD-10-CM | POA: Diagnosis not present

## 2024-06-30 DIAGNOSIS — E611 Iron deficiency: Secondary | ICD-10-CM | POA: Diagnosis not present

## 2024-06-30 DIAGNOSIS — D751 Secondary polycythemia: Secondary | ICD-10-CM | POA: Insufficient documentation

## 2024-06-30 NOTE — Patient Instructions (Signed)
 [facility] at [facility] **VISIT SUMMARY & IMPORTANT INSTRUCTIONS **   You were seen today by Pleasant Barefoot PA-C for your iron  deficiency.    At this time, it is unclear if your ongoing fatigue is due to iron  deficiency or your fibromyalgia/other conditions.  Since you have ongoing fatigue with ferritin <100, we will schedule you for an additional dose of IV iron .  I also suggest that you start taking ORAL IRON  TABLET every other day.  Since the ferrous sulfate caused significant side effects, I recommend trying another type of iron : Slow Fe = a slow releasing type of ferrous sulfate that is gentler on your stomach Gentle Iron  = ferrous bisglycinate, which is easier absorbed and gentler on your stomach and intestines.  Here are some other tips and tricks to help your body handle iron  better: Take your iron  pill in the morning with a glass of orange juice or a vitamin C supplement.  This helps your body to absorb it better. Take your iron  pill with food. Take with stool softener if experiencing constipation.  Make sure you are also drinking plenty of water and consider a fiber supplement.  FOLLOW-UP APPOINTMENT: Repeat labs and office visit in 6 months  ** Thank you for trusting me with your healthcare!  I strive to provide all of my patients with quality care at each visit.  If you receive a survey for this visit, I would be so grateful to you for taking the time to provide feedback.  Thank you in advance!  ~ Becky Colan                   Dr. Alean Stands   &   Pleasant Barefoot, PA-C   - - - - - - - - - - - - - - - - - -    Thank you for choosing [facility] at [facility] to provide your oncology and hematology care.  To afford each patient quality time with our provider, please arrive at least 15 minutes before your scheduled appointment time.   If you have a lab appointment with the Cancer Center please come in thru the Main  Entrance and check in at the main information desk.  You need to re-schedule your appointment should you arrive 10 or more minutes late.  We strive to give you quality time with our providers, and arriving late affects you and other patients whose appointments are after yours.  Also, if you no show three or more times for appointments you may be dismissed from the clinic at the providers discretion.     Again, thank you for choosing [facility].  Our hope is that these requests will decrease the amount of time that you wait before being seen by our physicians.       _____________________________________________________________  Should you have questions after your visit to [facility], please contact our office at [phone] and follow the prompts.  Our office hours are 8:00 a.m. and 4:30 p.m. Monday - Friday.  Please note that voicemails left after 4:00 p.m. may not be returned until the following business day.  We are closed weekends and major holidays.  You do have access to a nurse 24-7, just call the main number to the clinic [phone] and do not press any options, hold on the line and a nurse will answer the phone.    For prescription refill requests, have your  pharmacy contact our office and allow 72 hours.

## 2024-07-05 ENCOUNTER — Ambulatory Visit: Admitting: Internal Medicine

## 2024-07-06 ENCOUNTER — Ambulatory Visit

## 2024-07-07 ENCOUNTER — Ambulatory Visit

## 2024-07-08 ENCOUNTER — Other Ambulatory Visit: Payer: Self-pay | Admitting: "Endocrinology

## 2024-07-09 NOTE — Telephone Encounter (Signed)
 Scheduled F/U for 07/12/24 MyChart visit

## 2024-07-11 ENCOUNTER — Other Ambulatory Visit: Payer: Self-pay | Admitting: "Endocrinology

## 2024-07-11 DIAGNOSIS — Z794 Long term (current) use of insulin: Secondary | ICD-10-CM

## 2024-07-12 ENCOUNTER — Inpatient Hospital Stay

## 2024-07-12 NOTE — Telephone Encounter (Signed)
 Requested Prescriptions   Pending Prescriptions Disp Refills   JARDIANCE  25 MG TABS tablet [Pharmacy Med Name: Jardiance  25 MG Oral Tablet] 30 tablet 0    Sig: TAKE 1 TABLET BY MOUTH ONCE DAILY BEFORE BREAKFAST

## 2024-07-14 ENCOUNTER — Emergency Department (HOSPITAL_COMMUNITY)
Admission: EM | Admit: 2024-07-14 | Discharge: 2024-07-15 | Disposition: A | Discharge status: Home / Self Care | Attending: Emergency Medicine | Admitting: Emergency Medicine

## 2024-07-14 ENCOUNTER — Emergency Department (HOSPITAL_COMMUNITY)

## 2024-07-14 ENCOUNTER — Other Ambulatory Visit: Payer: Self-pay

## 2024-07-14 ENCOUNTER — Encounter (HOSPITAL_COMMUNITY): Payer: Self-pay

## 2024-07-14 DIAGNOSIS — R112 Nausea with vomiting, unspecified: Secondary | ICD-10-CM | POA: Insufficient documentation

## 2024-07-14 DIAGNOSIS — R1010 Upper abdominal pain, unspecified: Secondary | ICD-10-CM | POA: Insufficient documentation

## 2024-07-14 DIAGNOSIS — Z794 Long term (current) use of insulin: Secondary | ICD-10-CM | POA: Diagnosis not present

## 2024-07-14 DIAGNOSIS — I10 Essential (primary) hypertension: Secondary | ICD-10-CM | POA: Insufficient documentation

## 2024-07-14 DIAGNOSIS — E876 Hypokalemia: Secondary | ICD-10-CM | POA: Insufficient documentation

## 2024-07-14 DIAGNOSIS — Z7984 Long term (current) use of oral hypoglycemic drugs: Secondary | ICD-10-CM | POA: Diagnosis not present

## 2024-07-14 DIAGNOSIS — Z79899 Other long term (current) drug therapy: Secondary | ICD-10-CM | POA: Insufficient documentation

## 2024-07-14 DIAGNOSIS — Z87891 Personal history of nicotine dependence: Secondary | ICD-10-CM | POA: Insufficient documentation

## 2024-07-14 DIAGNOSIS — E119 Type 2 diabetes mellitus without complications: Secondary | ICD-10-CM | POA: Insufficient documentation

## 2024-07-14 DIAGNOSIS — R197 Diarrhea, unspecified: Secondary | ICD-10-CM | POA: Diagnosis present

## 2024-07-14 LAB — COMPREHENSIVE METABOLIC PANEL WITH GFR
ALT: 29 U/L (ref 0–44)
AST: 23 U/L (ref 15–41)
Albumin: 3.5 g/dL (ref 3.5–5.0)
Alkaline Phosphatase: 70 U/L (ref 38–126)
Anion gap: 13 (ref 5–15)
BUN: 20 mg/dL (ref 6–20)
CO2: 27 mmol/L (ref 22–32)
Calcium: 8.4 mg/dL — ABNORMAL LOW (ref 8.9–10.3)
Chloride: 98 mmol/L (ref 98–111)
Creatinine, Ser: 1.03 mg/dL — ABNORMAL HIGH (ref 0.44–1.00)
GFR, Estimated: >60 mL/min (ref >60)
Glucose, Bld: 95 mg/dL (ref 70–99)
Potassium: 2.8 mmol/L — ABNORMAL LOW (ref 3.5–5.1)
Sodium: 138 mmol/L (ref 135–145)
Total Bilirubin: 1.5 mg/dL — ABNORMAL HIGH (ref 0.0–1.2)
Total Protein: 6.3 g/dL — ABNORMAL LOW (ref 6.5–8.1)

## 2024-07-14 LAB — CBC WITH DIFFERENTIAL/PLATELET
Abs Immature Granulocytes: 0.03 K/uL (ref 0.00–0.07)
Basophils Absolute: 0 K/uL (ref 0.0–0.1)
Basophils Relative: 0 %
Eosinophils Absolute: 0.7 K/uL — ABNORMAL HIGH (ref 0.0–0.5)
Eosinophils Relative: 7 %
HCT: 48.2 % — ABNORMAL HIGH (ref 36.0–46.0)
Hemoglobin: 16.3 g/dL — ABNORMAL HIGH (ref 12.0–15.0)
Immature Granulocytes: 0 %
Lymphocytes Relative: 25 %
Lymphs Abs: 2.6 K/uL (ref 0.7–4.0)
MCH: 31.8 pg (ref 26.0–34.0)
MCHC: 33.8 g/dL (ref 30.0–36.0)
MCV: 94 fL (ref 80.0–100.0)
Monocytes Absolute: 0.8 K/uL (ref 0.1–1.0)
Monocytes Relative: 8 %
Neutro Abs: 6.2 K/uL (ref 1.7–7.7)
Neutrophils Relative %: 60 %
Platelets: 279 K/uL (ref 150–400)
RBC: 5.13 MIL/uL — ABNORMAL HIGH (ref 3.87–5.11)
RDW: 13.6 % (ref 11.5–15.5)
WBC: 10.3 K/uL (ref 4.0–10.5)
nRBC: 0 % (ref 0.0–0.2)

## 2024-07-14 LAB — LIPASE, BLOOD: Lipase: 21 U/L (ref 11–51)

## 2024-07-14 MED ORDER — SODIUM CHLORIDE 0.9 % IV BOLUS
1000.0000 mL | Freq: Once | INTRAVENOUS | Status: AC
  Administered 2024-07-14: 1000 mL via INTRAVENOUS

## 2024-07-14 MED ORDER — ONDANSETRON HCL 4 MG/2ML IJ SOLN
4.0000 mg | Freq: Once | INTRAMUSCULAR | Status: AC
  Administered 2024-07-14: 4 mg via INTRAVENOUS
  Filled 2024-07-14: qty 2

## 2024-07-14 MED ORDER — IOHEXOL 300 MG/ML  SOLN
100.0000 mL | Freq: Once | INTRAMUSCULAR | Status: AC | PRN
  Administered 2024-07-14: 100 mL via INTRAVENOUS

## 2024-07-14 MED ORDER — KETOROLAC TROMETHAMINE 15 MG/ML IJ SOLN
15.0000 mg | Freq: Once | INTRAMUSCULAR | Status: AC
  Administered 2024-07-14: 15 mg via INTRAVENOUS
  Filled 2024-07-14: qty 1

## 2024-07-14 NOTE — ED Provider Notes (Signed)
 [facility] AT [facility] Provider Note  CSN: [id number] Arrival date & time: 07/14/24 2116  Chief Complaint(s) Diarrhea  HPI Sydney Taylor is a 58 y.o. female history of diabetes, obesity,, hypertension, hyperlipidemia presenting with diarrhea.  Patient reports diarrhea started yesterday.  Reports associated nausea, vomiting.  She reports some upper abdominal pain as well.  No hematemesis, hematochezia.  No urinary symptoms.  No recent antibiotic use, travel.  No one else is sick at home.  Reports that she had a similar episode a couple weeks ago went away on its own.  Reports having some borderline low blood sugar at times as she has not been eating much and uses insulin .   Past Medical History Past Medical History:  Diagnosis Date   Anemia    Anxiety    Arrhythmia    Arthritis    Bipolar depression (HCC) 12/28/2019   Cataract    Chronic fatigue    Chronic headaches    Depression    Diabetes mellitus without complication (HCC)    Fibromyalgia    GERD (gastroesophageal reflux disease)    Heart murmur    High cholesterol    HLD (hyperlipidemia)    Hypertension    Insect bite    Migraine    Prediabetes    Sleep apnea    Thyroid  disease    Urinary incontinence    Vasculitis (HCC)    Patient Active Problem List   Diagnosis Date Noted   URTI (acute upper respiratory infection) 01/28/2024   Iron  deficiency 11/26/2023   Hypersomnolence 05/28/2023   Recurrent sinusitis 03/17/2023   Abnormal EKG 02/10/2023   Epigastric pain determined by examination 02/10/2023   Acute right-sided thoracic back pain 01/30/2023   Myofascial pain dysfunction syndrome 01/27/2023   Chronic fatigue 10/07/2022   Cutaneous vasculitis 09/23/2022   Diarrhea 07/30/2022   Rash and nonspecific skin eruption 06/07/2022   Essential hypertension 06/03/2022   Fatty liver 03/05/2022   Encounter for general adult medical examination with abnormal findings 02/01/2022    Plantar fasciitis 02/01/2022   Chest pain 01/14/2022   Chronic migraine with aura 10/29/2021   Diabetic polyneuropathy associated with type 2 diabetes mellitus (HCC) 09/19/2021   History of hormone therapy 09/19/2021   Bilateral primary osteoarthritis of knee 04/10/2021   Perennial allergic rhinitis 04/06/2021   Allergic contact dermatitis 04/06/2021   Urticaria 04/06/2021   Fibromyalgia 01/12/2021   Elevated C-reactive protein (CRP) 01/12/2021   Diverticulosis of colon without hemorrhage 01/02/2021   Major depressive disorder, recurrent (HCC) 07/14/2020   GERD (gastroesophageal reflux disease) 05/11/2020   Type 2 diabetes mellitus (HCC) 03/27/2020   Bipolar depression (HCC) 12/28/2019   OSA on CPAP 12/28/2019   Tachycardia 10/31/2019   Hyperlipidemia 10/30/2015   Morbid obesity (HCC) 10/30/2015   Home Medication(s) Prior to Admission medications   Medication Sig Start Date End Date Taking? Authorizing Provider  diphenoxylate -atropine  (LOMOTIL ) 2.5-0.025 MG tablet Take 1 tablet by mouth 4 (four) times daily as needed for diarrhea or loose stools. 07/15/24  Yes Delo, Vicenta, MD  Acetylcysteine (N-ACETYL CYSTEINE) 600 MG CAPS Take 1 capsule by mouth daily.    [provider]  ALPRAZolam (XANAX) 0.5 MG tablet Take 0.25 mg by mouth daily as needed for anxiety. 07/05/20   [provider]  ARIPiprazole (ABILIFY) 2 MG tablet Take 2 mg by mouth daily. 03/29/24   [provider]  B-12 Methylcobalamin 1000 MCG TBDP Take 1,000 mcg by mouth daily.    [provider]  blood glucose meter kit and supplies KIT Inject 1 each into the skin daily. Check blood glucose once a day. Diagnosis code: E 11.9. 02/21/21   Gosrani, Nimish C, MD  calcium  carbonate (TUMS - DOSED IN MG ELEMENTAL CALCIUM ) 500 MG chewable tablet Chew 1,000 mg by mouth daily as needed for indigestion or heartburn.    [provider]  chlorthalidone  (HYGROTON ) 25 MG tablet Take 0.5 tablets (12.5  mg total) by mouth daily. 06/16/24 09/14/24  Tobie Suzzane POUR, MD  Continuous Glucose Receiver (DEXCOM G7 RECEIVER) DEVI 1 each by Does not apply route continuous.    [provider]  Continuous Glucose Sensor (DEXCOM G7 SENSOR) MISC Use to check glucose continuously, change sensor every 10 days 03/04/24   Dartha Ernst, MD  cyclobenzaprine  (FLEXERIL ) 5 MG tablet Take 1 tablet (5 mg total) by mouth 2 (two) times daily as needed. 09/19/23   Tobie Suzzane POUR, MD  DULoxetine (CYMBALTA) 60 MG capsule Take 120 mg by mouth every morning. Two capsules (120mg ) daily 05/14/21   [provider]  gabapentin  (NEURONTIN ) 300 MG capsule Take 1 capsule (300 mg total) by mouth at bedtime. X 1 week then can increase to 600 mg QHS if possible- for [facility] 07/18/23   Lovorn, Megan, MD  glucose blood (ACCU-CHEK GUIDE) test strip 1 each by Other route in the morning, at noon, and at bedtime. Use as instructed 08/07/21   Elnor Lauraine BRAVO, NP  insulin  degludec (TRESIBA  FLEXTOUCH) 200 UNIT/ML FlexTouch Pen Inject 86 Units into the skin 2 (two) times daily. 03/16/24   Dartha Ernst, MD  Insulin  Degludec FlexTouch 200 UNIT/ML SOPN 86 units qam, 84 units qpm 04/08/24   Motwani, Komal, MD  insulin  lispro (HUMALOG  KWIKPEN) 100 UNIT/ML KwikPen Inject 1-5 Units into the skin as needed. 04/01/24   Motwani, Komal, MD  Insulin  Pen Needle (PEN NEEDLES) 32G X 4 MM MISC Check BS 2 x a day 03/11/24   Motwani, Komal, MD  Insulin  Pen Needle (RELION PEN NEEDLES) 31G X 6 MM MISC USE 2 PEN NEEDLEs to check blood glucose twice a day 12/26/23   Motwani, Komal, MD  JARDIANCE  25 MG TABS tablet TAKE 1 TABLET BY MOUTH ONCE DAILY BEFORE BREAKFAST 07/12/24   Motwani, Komal, MD  L-Methylfolate 15 MG TABS Take 15 mg by mouth daily at 6 (six) AM.    [provider]  losartan  (COZAAR ) 25 MG tablet Take 1 tablet (25 mg total) by mouth daily. 06/16/24   Tobie Suzzane POUR, MD  metFORMIN  (GLUCOPHAGE -XR) 500 MG 24 hr tablet Take 2 tablets (1,000 mg total) by  mouth daily with supper. 12/01/23   Motwani, Komal, MD  metoprolol  tartrate (LOPRESSOR ) 25 MG tablet Take 1 tablet by mouth twice daily 12/02/23   Ross, Paula V, MD  modafinil (PROVIGIL) 100 MG tablet Take 100 mg by mouth every morning.    [provider]  Multiple Vitamins-Minerals (MULTIVITAMIN WOMEN 50+) TABS Take 1 tablet by mouth daily.    [provider]  NONFORMULARY OR COMPOUNDED ITEM Take 4 mg by mouth at bedtime. X 2 weeks, then 8 mg nightly- for nerve pain and chronic fatigue- sent in 2 week Rx for 4 mg and 6 months supply for 8 mg- sent to [facility] Patient taking differently: Take 8 mg by mouth at bedtime. Naltrexone 8mg  11/20/23   Lovorn, Megan, MD  nystatin  powder Apply 1 Application topically 2 (two) times daily. 05/28/24   Bevely Doffing, FNP  omega-3 acid  ethyl esters (LOVAZA ) 1 g capsule Take 2 capsules (2 g total) by mouth 2 (two) times daily. 03/05/23   Tobie Suzzane POUR, MD  pantoprazole  (PROTONIX ) 40 MG tablet Take 1 tablet by mouth once daily 06/22/24   Rourk, Lamar HERO, MD  potassium chloride  SA (KLOR-CON  M) 20 MEQ tablet Take 2 tablets by mouth twice daily 01/12/24   Patel, Rutwik K, MD  rosuvastatin  (CRESTOR ) 5 MG tablet Take 1 tablet (5 mg total) by mouth daily. 06/16/24   Tobie Suzzane POUR, MD  SUMAtriptan  (IMITREX ) 100 MG tablet Take 1 tablet (100 mg total) by mouth every 2 (two) hours as needed for migraine. May repeat in 2 hours if headache persists or recurs. Maximum 2 per day. 02/06/24   Tobie Suzzane POUR, MD  tirzepatide  (MOUNJARO ) 5 MG/0.5ML Pen Inject 5 mg into the skin once a week. 07/15/24   Motwani, Komal, MD  topiramate  (TOPAMAX ) 25 MG tablet Take 1 tablet (25 mg total) by mouth at bedtime. 05/05/24   Ines Onetha NOVAK, MD                                                                                                                                    Past Surgical History Past Surgical History:  Procedure Laterality Date   BIOPSY  09/04/2022    Procedure: BIOPSY;  Surgeon: Shaaron Lamar HERO, MD;  Location: [facility];  Service: Endoscopy;;   CHOLECYSTECTOMY N/A 03/15/2022   Procedure: LAPAROSCOPIC CHOLECYSTECTOMY WITH INTRAOPERATIVE CHOLANGIOGRAM;  Surgeon: Kallie Manuelita BROCKS, MD;  Location: [facility] OR[facility];  Service: General;  Laterality: N/A;   CHOLECYSTECTOMY  02/03/2022   COLONOSCOPY  2017   per patient normal and needs next one in 2027 (Dr. Tobie)   COLONOSCOPY WITH PROPOFOL  N/A 09/04/2022   Procedure: COLONOSCOPY WITH PROPOFOL ;  Surgeon: Shaaron Lamar HERO, MD;  Location: [facility];  Service: Endoscopy;  Laterality: N/A;   ESOPHAGOGASTRODUODENOSCOPY N/A 05/12/2020   Procedure: ESOPHAGOGASTRODUODENOSCOPY (EGD);  Surgeon: Shaaron Lamar HERO, MD;  Location: [facility];  Service: Endoscopy;  Laterality: N/A;  11:15am   Joint fusion on foot Right    LIVER BIOPSY N/A 03/15/2022   Procedure: LIVER BIOPSY;  Surgeon: Kallie Manuelita BROCKS, MD;  Location: [facility];  Service: General;  Laterality: N/A;   MALONEY DILATION N/A 05/12/2020   Procedure: AGAPITO HODGKIN;  Surgeon: Shaaron Lamar HERO, MD;  Location: [facility];  Service: Endoscopy;  Laterality: N/A;   NASAL SEPTUM SURGERY     POLYPECTOMY  09/04/2022   Procedure: POLYPECTOMY;  Surgeon: Shaaron Lamar HERO, MD;  Location: [facility];  Service: Endoscopy;;   SINOSCOPY     Family History Family History  Problem Relation Age of Onset   Hypertension Father    Hyperlipidemia Father    Diabetes Father    Stroke Father    Depression Father    Anxiety disorder Father    Hearing loss Father  Heart disease Father    Obesity Father    Hypertension Mother    Hyperlipidemia Mother    Diabetes Mellitus II Mother    Congestive Heart Failure Mother    Heart attack Mother    Anxiety disorder Mother    Arthritis Mother    Diabetes Mother    Heart disease Mother    Obesity Mother    Varicose Veins Mother    Hypertension Sister    Diabetes Sister    Hyperlipidemia Sister    Obesity  Sister    Hypertension Brother    Autoimmune disease Brother    Vision loss Brother    Diabetes Maternal Grandmother    Arthritis Maternal Grandmother    Obesity Maternal Grandmother    Heart disease Maternal Grandfather    Stroke Maternal Grandfather    Lupus Paternal Grandmother    Depression Paternal Grandmother    Anxiety disorder Paternal Grandmother    Heart disease Paternal Grandmother    Alcohol abuse Paternal Grandfather    Depression Maternal Aunt    Anxiety disorder Maternal Aunt    Cancer Maternal Aunt    Diabetes Paternal Uncle    Colon cancer Neg Hx    Esophageal cancer Neg Hx    Gastric cancer Neg Hx     Social History Social History   Tobacco Use   Smoking status: Former    Current packs/day: 0.00    Types: Cigarettes    Quit date: 12/30/1982    Years since quitting: 41.5   Smokeless tobacco: Never  Vaping Use   Vaping status: Never Used  Substance Use Topics   Alcohol use: Not Currently    Alcohol/week: 0.0 standard drinks of alcohol   Drug use: Never   Allergies Cephalexin   Review of Systems Review of Systems  All other systems reviewed and are negative.   Physical Exam Vital Signs  I have reviewed the triage vital signs BP (!) 104/49   Pulse 89   Temp 98.1 F (36.7 C) (Oral)   Resp 13   Ht 5' 9 (1.753 m)   Wt 117.9 kg   LMP  (LMP Unknown)   SpO2 94%   BMI 38.40 kg/m  Physical Exam Vitals and nursing note reviewed.  Constitutional:      General: She is not in acute distress.    Appearance: She is well-developed. She is obese.  HENT:     Head: Normocephalic and atraumatic.     Mouth/Throat:     Mouth: Mucous membranes are moist.  Eyes:     Pupils: Pupils are equal, round, and reactive to light.  Cardiovascular:     Rate and Rhythm: Normal rate and regular rhythm.     Heart sounds: No murmur heard. Pulmonary:     Effort: Pulmonary effort is normal. No respiratory distress.     Breath sounds: Normal breath sounds.   Abdominal:     General: Abdomen is flat.     Palpations: Abdomen is soft.     Tenderness: There is abdominal tenderness (upper abdomen).  Musculoskeletal:        General: No tenderness.     Right lower leg: No edema.     Left lower leg: No edema.  Skin:    General: Skin is warm and dry.  Neurological:     General: No focal deficit present.     Mental Status: She is alert. Mental status is at baseline.  Psychiatric:        Mood and  Affect: Mood normal.        Behavior: Behavior normal.     ED Results and Treatments Labs (all labs ordered are listed, but only abnormal results are displayed) Labs Reviewed  COMPREHENSIVE METABOLIC PANEL WITH GFR - Abnormal; Notable for the following components:      Result Value   Potassium 2.8 (*)    Creatinine, Ser 1.03 (*)    Calcium  8.4 (*)    Total Protein 6.3 (*)    Total Bilirubin 1.5 (*)    All other components within normal limits  CBC WITH DIFFERENTIAL/PLATELET - Abnormal; Notable for the following components:   RBC 5.13 (*)    Hemoglobin 16.3 (*)    HCT 48.2 (*)    Eosinophils Absolute 0.7 (*)    All other components within normal limits  LIPASE, BLOOD                                                                                                                          Radiology CT ABDOMEN PELVIS W CONTRAST Result Date: 07/14/2024 CLINICAL DATA:  Acute abdominal pain with emesis and diarrhea. EXAM: CT ABDOMEN AND PELVIS WITH CONTRAST TECHNIQUE: Multidetector CT imaging of the abdomen and pelvis was performed using the standard protocol following bolus administration of intravenous contrast. RADIATION DOSE REDUCTION: This exam was performed according to the departmental dose-optimization program which includes automated exposure control, adjustment of the mA and/or kV according to patient size and/or use of iterative reconstruction technique. CONTRAST:  OMNIPAQUE  IOHEXOL  300 MG/ML  SOLN COMPARISON:  CT abdomen  04/01/2023  FINDINGS: Lower chest: No acute abnormality. Hepatobiliary: The liver is enlarged. There is diffuse fatty infiltration of the liver. Gallbladder surgically absent. There is no biliary ductal dilatation. Pancreas: Unremarkable. No pancreatic ductal dilatation or surrounding inflammatory changes. Spleen: There is a stable hypodensity in the spleen measuring 16 mm favored as a cyst or hemangioma. Spleen is normal in size. Adrenals/Urinary Tract: Adrenal glands are unremarkable. Kidneys are normal, without renal calculi, focal lesion, or hydronephrosis. Bladder is unremarkable. Stomach/Bowel: Stomach is within normal limits. Appendix appears normal. No evidence of bowel wall thickening, distention, or inflammatory changes. Vascular/Lymphatic: Aortic atherosclerosis. No enlarged abdominal or pelvic lymph nodes. Reproductive: Exophytic fibroid is seen anteriorly measuring 1.8 cm. Adnexa are within normal limits. Other: There is a small fat containing umbilical hernia. There is no ascites. Musculoskeletal: No acute or significant osseous findings. IMPRESSION: 1. No acute localizing process in the abdomen or pelvis. 2. Hepatomegaly with fatty infiltration of the liver. 3. Stable splenic hypodensity favored as a cyst or hemangioma. 4. Uterine fibroid. 5. Aortic atherosclerosis. Aortic Atherosclerosis (ICD10-I70.0). Electronically Signed   By: Greig Pique M.D.   On: 07/14/2024 23:51    Pertinent labs & imaging results that were available during my care of the patient were reviewed by me and considered in my medical decision making (see MDM for details).  Medications Ordered in ED Medications  sodium chloride  0.9 % bolus  1,000 mL (0 mLs Intravenous Stopped 07/15/24 0009)  ondansetron  (ZOFRAN ) injection 4 mg (4 mg Intravenous Given 07/14/24 2242)  ketorolac  (TORADOL ) 15 MG/ML injection 15 mg (15 mg Intravenous Given 07/14/24 2241)  iohexol  (OMNIPAQUE ) 300 MG/ML solution 100 mL (100 mLs Intravenous Contrast Given  07/14/24 2338)  diphenoxylate -atropine  (LOMOTIL ) 2.5-0.025 MG per tablet 1 tablet (1 tablet Oral Given 07/15/24 0108)                                                                                                                                     Procedures Procedures  (including critical care time)  Medical Decision Making / ED Course   MDM:  58 year old presenting to the emergency room with abdominal pain.  Patient overall well-appearing, physical examination with mild diffuse abdominal tenderness.    Differential includes gastroenteritis, colitis, diverticulitis, pancreatitis, biliary colic, hepatitis,.  Considered other process such as invasive diarrhea but no hematochezia.  No recent antibiotic use or risk factors for C. difficile.  Will obtain CT abdomen.  Will give IV hydration and nausea medication.  Will reassess.  Patient is overall well-appearing.     Clinical Course as of 07/15/24 1608  Wed Jul 14, 2024  2332 Signed out to Dr. Geroldine pending CT scan, labs.  [WS]    Clinical Course User Index [WS] Francesca Elsie CROME, MD     Additional history obtained: -Additional history obtained from spouse -External records from outside source obtained and reviewed including: Chart review including previous notes, labs, imaging, consultation notes including prior notes    Lab Tests: -I ordered, reviewed, and interpreted labs.   The pertinent results include:   Labs Reviewed  COMPREHENSIVE METABOLIC PANEL WITH GFR - Abnormal; Notable for the following components:      Result Value   Potassium 2.8 (*)    Creatinine, Ser 1.03 (*)    Calcium  8.4 (*)    Total Protein 6.3 (*)    Total Bilirubin 1.5 (*)    All other components within normal limits  CBC WITH DIFFERENTIAL/PLATELET - Abnormal; Notable for the following components:   RBC 5.13 (*)    Hemoglobin 16.3 (*)    HCT 48.2 (*)    Eosinophils Absolute 0.7 (*)    All other components within normal limits  LIPASE, BLOOD     Notable for possible mild aki/hemoconcentration   Imaging Studies ordered: I ordered imaging studies including CT abdomen Pending at time of discharge    Medicines ordered and prescription drug management: Meds ordered this encounter  Medications   sodium chloride  0.9 % bolus 1,000 mL   ondansetron  (ZOFRAN ) injection 4 mg   ketorolac  (TORADOL ) 15 MG/ML injection 15 mg   iohexol  (OMNIPAQUE ) 300 MG/ML solution 100 mL   diphenoxylate -atropine  (LOMOTIL ) 2.5-0.025 MG per tablet 1 tablet   diphenoxylate -atropine  (LOMOTIL ) 2.5-0.025 MG tablet    Sig: Take 1 tablet by mouth 4 (four) times daily as needed for  diarrhea or loose stools.    Dispense:  12 tablet    Refill:  0    -I have reviewed the patients home medicines and have made adjustments as needed   Social Determinants of Health:  Diagnosis or treatment significantly limited by social determinants of health: obesity   Reevaluation: After the interventions noted above, I reevaluated the patient and found that their symptoms have improved  Co morbidities that complicate the patient evaluation  Past Medical History:  Diagnosis Date   Anemia    Anxiety    Arrhythmia    Arthritis    Bipolar depression (HCC) 12/28/2019   Cataract    Chronic fatigue    Chronic headaches    Depression    Diabetes mellitus without complication (HCC)    Fibromyalgia    GERD (gastroesophageal reflux disease)    Heart murmur    High cholesterol    HLD (hyperlipidemia)    Hypertension    Insect bite    Migraine    Prediabetes    Sleep apnea    Thyroid  disease    Urinary incontinence    Vasculitis (HCC)       Dispostion: Disposition decision including need for hospitalization was considered, and patient disposition pending at time of sign out.    Final Clinical Impression(s) / ED Diagnoses Final diagnoses:  Diarrhea, unspecified type     This chart was dictated using voice recognition software.  Despite best efforts to  proofread,  errors can occur which can change the documentation meaning.    Francesca Elsie CROME, MD 07/15/24 [phone]

## 2024-07-14 NOTE — ED Notes (Signed)
 Patient transported to CT

## 2024-07-14 NOTE — ED Triage Notes (Signed)
 Pt to ED from home with c/o emesis and diarrhea that started yesterday, pt says she vomited mostly yesterday, but hasn't since noon today, reports diarrhea started today, last episode was an hour ago. Pt says she had similar sx last week for a couple of days, but resolved on its own.

## 2024-07-15 ENCOUNTER — Telehealth (INDEPENDENT_AMBULATORY_CARE_PROVIDER_SITE_OTHER): Admitting: "Endocrinology

## 2024-07-15 ENCOUNTER — Encounter: Payer: Self-pay | Admitting: "Endocrinology

## 2024-07-15 DIAGNOSIS — Z794 Long term (current) use of insulin: Secondary | ICD-10-CM | POA: Diagnosis not present

## 2024-07-15 DIAGNOSIS — Z7984 Long term (current) use of oral hypoglycemic drugs: Secondary | ICD-10-CM

## 2024-07-15 DIAGNOSIS — E1165 Type 2 diabetes mellitus with hyperglycemia: Secondary | ICD-10-CM

## 2024-07-15 DIAGNOSIS — Z7985 Long-term (current) use of injectable non-insulin antidiabetic drugs: Secondary | ICD-10-CM

## 2024-07-15 DIAGNOSIS — E782 Mixed hyperlipidemia: Secondary | ICD-10-CM

## 2024-07-15 MED ORDER — TIRZEPATIDE 5 MG/0.5ML ~~LOC~~ SOAJ
5.0000 mg | SUBCUTANEOUS | 0 refills | Status: DC
Start: 2024-07-15 — End: 2024-11-12

## 2024-07-15 MED ORDER — DIPHENOXYLATE-ATROPINE 2.5-0.025 MG PO TABS: 1.0000 | ORAL_TABLET | Freq: Four times a day (QID) | ORAL | 0 refills | Status: DC | PRN

## 2024-07-15 MED ORDER — DIPHENOXYLATE-ATROPINE 2.5-0.025 MG PO TABS
1.0000 | ORAL_TABLET | Freq: Once | ORAL | Status: AC
  Administered 2024-07-15: 1 via ORAL
  Filled 2024-07-15: qty 1

## 2024-07-15 NOTE — Progress Notes (Signed)
 The patient reports they are currently: [name]. I spent 9-10 minutes on the video with the patient on the date of service. I spent an additional 5 minutes on pre- and post-visit activities on the date of service.   The patient was physically located in [place]  or a state in which I am permitted to provide care. The patient and/or parent/guardian understood that s/he may incur co-pays and cost sharing, and agreed to the telemedicine visit. The visit was reasonable and appropriate under the circumstances given the patient's presentation at the time.  The patient and/or parent/guardian has been advised of the potential risks and limitations of this mode of treatment (including, but not limited to, the absence of in-person examination) and has agreed to be treated using telemedicine. The patient's/patient's family's questions regarding telemedicine have been answered.   The patient and/or parent/guardian has also been advised to contact their provider's office for worsening conditions, and seek emergency medical treatment and/or call 911 if the patient deems either necessary.    Outpatient Endocrinology Note Sydney Birmingham, MD  07/15/24   Sydney Taylor 1966-08-29 [id number]  Referring Provider: Tobie Suzzane MARLA, MD Primary Care Provider: Tobie Suzzane MARLA, MD Reason for consultation: Subjective   Assessment & Plan  Diagnoses and all orders for this visit:  Uncontrolled type 2 diabetes mellitus with hyperglycemia (HCC)  Long term (current) use of oral hypoglycemic drugs  Long-term (current) use of injectable non-insulin  antidiabetic drugs  Long-term insulin  use (HCC)  Mixed hyperlipidemia  Other orders -     tirzepatide  (MOUNJARO ) 5 MG/0.5ML Pen; Inject 5 mg into the skin once a week.   Diabetes Type II complicated by neuropathy,  Lab Results  Component Value Date   GFR 57.44 (L) 08/11/2023   Hba1c goal less than 7, current Hba1c is  Lab Results  Component Value Date    HGBA1C 6.8 (H) 03/25/2024   Will recommend the following: Jardiance  25 mg daily, metformin  ER 1 g at dinner Tresiba  86 units qam, 84 units qpm     Mounjaro  5 mg once a week Disucced S/E, no current contraindication  Humalog  correction scale: Use in addition to your meal time/short acting insulin  based on blood sugars as follows:  201 - 250: 2 units 251 - 300: 3 units 301 - 350: 4 units 351 - 400: 5 units  No known contraindications/side effects to any of above medications Glucagon  discussed and prescribed with refills on 07/15/24  Instructed to take potasium 20 mq bid for a week with repeat lab in 1 week by PCP Patient understands and agreed Has prescription at home  -Last LD and Tg are as follows: Lab Results  Component Value Date   LDLCALC 67 03/25/2024    Lab Results  Component Value Date   TRIG 203 (H) 03/25/2024   -on rosuvastatin  5 mg every day starting 08/14/23 -Follow low fat diet and exercise   -Blood pressure goal <140/90 - Microalbumin/creatinine goal is < 30 -Last MA/Cr is as follows: Lab Results  Component Value Date   MICROALBUR 1.1 12/18/2020   -on ACE/ARB losartan  25 mg qd -diet changes including salt restriction -limit eating outside -counseled BP targets per standards of diabetes care -uncontrolled blood pressure can lead to retinopathy, nephropathy and cardiovascular and atherosclerotic heart disease  Reviewed and counseled on: -A1C target -Blood sugar targets -Complications of uncontrolled diabetes  -Checking blood sugar before meals and bedtime and bring log next visit -All medications with mechanism of action and side effects -Hypoglycemia management:  rule of 15's, Glucagon  Emergency Kit and medical alert ID -low-carb low-fat plate-method diet -At least 20 minutes of physical activity per day -Annual dilated retinal eye exam and foot exam -compliance and follow up needs -follow up as scheduled or earlier if problem gets worse  Call  if blood sugar is less than 70 or consistently above 250    Take a 15 gm snack of carbohydrate at bedtime before you go to sleep if your blood sugar is less than 100.    If you are going to fast after midnight for a test or procedure, ask your physician for instructions on how to reduce/decrease your insulin  dose.    Call if blood sugar is less than 70 or consistently above 250  -Treating a low sugar by rule of 15  (15 gms of sugar every 15 min until sugar is more than 70) If you feel your sugar is low, test your sugar to be sure If your sugar is low (less than 70), then take 15 grams of a fast acting Carbohydrate (3-4 glucose tablets or glucose gel or 4 ounces of juice or regular soda) Recheck your sugar 15 min after treating low to make sure it is more than 70 If sugar is still less than 70, treat again with 15 grams of carbohydrate          Don't drive the hour of hypoglycemia  If unconscious/unable to eat or drink by mouth, use glucagon  injection or nasal spray baqsimi  and call 911. Can repeat again in 15 min if still unconscious.  Return in about 3 months (around 10/15/2024).   I have reviewed current medications, nurse's notes, allergies, vital signs, past medical and surgical history, family medical history, and social history for this encounter. Counseled patient on symptoms, examination findings, lab findings, imaging results, treatment decisions and monitoring and prognosis. The patient understood the recommendations and agrees with the treatment plan. All questions regarding treatment plan were fully answered.  Sydney Birmingham, MD  07/15/24   History of Present Illness Sydney Taylor is a 58 y.o. year old female who presents for follow up of Type II diabetes mellitus.  Sydney Taylor was first diagnosed in 2021.   Diabetes education +  Home diabetes regimen: Jardiance  25 mg daily, metformin  ER 1 g at dinner Tresiba  86 units qam, 84 units qpm     Mounjaro  7.5 mg once a  week- episodic GI issues weeks after 7.5 mg dose start , unclear if related to it or not     Side effects from medications: Nausea and vomiting with Trulicity   Previous history: She had mild hyperglycemia in 2020 but had significant increase in glucose of 484 in 3/21  Non-insulin  hypoglycemic drugs previously used: Jardiance  since 9/21, Bydureon , Trulicity , metformin , glipizide  Insulin  was started in 2022  A1c range in the last few years is: 6.4-12.7  COMPLICATIONS -  MI/Stroke -  retinopathy +  neuropathy +  nephropathy  BLOOD SUGAR DATA CGM interpretation: At today's visit, we reviewed her CGM downloads. The full report is scanned in the media. Reviewing the CGM trends, BG are well controlled most of the day with some highs with meals.  Physical Exam  LMP  (LMP Unknown)    Constitutional: well developed, well nourished Head: normocephalic, atraumatic Eyes: sclera anicteric, no redness Neck: supple Lungs: normal respiratory effort Neurology: alert and oriented Skin: dry, no appreciable rashes Musculoskeletal: no appreciable defects Psychiatric: normal mood and affect Diabetic Foot Exam - Simple  No data filed      Current Medications Patient's Medications  New Prescriptions   TIRZEPATIDE  (MOUNJARO ) 5 MG/0.5ML PEN    Inject 5 mg into the skin once a week.  Previous Medications   ACETYLCYSTEINE (N-ACETYL CYSTEINE) 600 MG CAPS    Take 1 capsule by mouth daily.   ALPRAZOLAM (XANAX) 0.5 MG TABLET    Take 0.25 mg by mouth daily as needed for anxiety.   ARIPIPRAZOLE (ABILIFY) 2 MG TABLET    Take 2 mg by mouth daily.   B-12 METHYLCOBALAMIN 1000 MCG TBDP    Take 1,000 mcg by mouth daily.   BLOOD GLUCOSE METER KIT AND SUPPLIES KIT    Inject 1 each into the skin daily. Check blood glucose once a day. Diagnosis code: E 11.9.   CALCIUM  CARBONATE (TUMS - DOSED IN MG ELEMENTAL CALCIUM ) 500 MG CHEWABLE TABLET    Chew 1,000 mg by mouth daily as needed for indigestion or heartburn.    CHLORTHALIDONE  (HYGROTON ) 25 MG TABLET    Take 0.5 tablets (12.5 mg total) by mouth daily.   CONTINUOUS GLUCOSE RECEIVER (DEXCOM G7 RECEIVER) DEVI    1 each by Does not apply route continuous.   CONTINUOUS GLUCOSE SENSOR (DEXCOM G7 SENSOR) MISC    Use to check glucose continuously, change sensor every 10 days   CYCLOBENZAPRINE  (FLEXERIL ) 5 MG TABLET    Take 1 tablet (5 mg total) by mouth 2 (two) times daily as needed.   DIPHENOXYLATE -ATROPINE  (LOMOTIL ) 2.5-0.025 MG TABLET    Take 1 tablet by mouth 4 (four) times daily as needed for diarrhea or loose stools.   DULOXETINE (CYMBALTA) 60 MG CAPSULE    Take 120 mg by mouth every morning. Two capsules (120mg ) daily   GABAPENTIN  (NEURONTIN ) 300 MG CAPSULE    Take 1 capsule (300 mg total) by mouth at bedtime. X 1 week then can increase to 600 mg QHS if possible- for FMS   GLUCOSE BLOOD (ACCU-CHEK GUIDE) TEST STRIP    1 each by Other route in the morning, at noon, and at bedtime. Use as instructed   INSULIN  DEGLUDEC (TRESIBA  FLEXTOUCH) 200 UNIT/ML FLEXTOUCH PEN    Inject 86 Units into the skin 2 (two) times daily.   INSULIN  DEGLUDEC FLEXTOUCH 200 UNIT/ML SOPN    86 units qam, 84 units qpm   INSULIN  LISPRO (HUMALOG  KWIKPEN) 100 UNIT/ML KWIKPEN    Inject 1-5 Units into the skin as needed.   INSULIN  PEN NEEDLE (PEN NEEDLES) 32G X 4 MM MISC    Check BS 2 x a day   INSULIN  PEN NEEDLE (RELION PEN NEEDLES) 31G X 6 MM MISC    USE 2 PEN NEEDLEs to check blood glucose twice a day   JARDIANCE  25 MG TABS TABLET    TAKE 1 TABLET BY MOUTH ONCE DAILY BEFORE BREAKFAST   L-METHYLFOLATE 15 MG TABS    Take 15 mg by mouth daily at 6 (six) AM.   LOSARTAN  (COZAAR ) 25 MG TABLET    Take 1 tablet (25 mg total) by mouth daily.   METFORMIN  (GLUCOPHAGE -XR) 500 MG 24 HR TABLET    Take 2 tablets (1,000 mg total) by mouth daily with supper.   METOPROLOL  TARTRATE (LOPRESSOR ) 25 MG TABLET    Take 1 tablet by mouth twice daily   MODAFINIL (PROVIGIL) 100 MG TABLET    Take 100 mg by mouth  every morning.   MULTIPLE VITAMINS-MINERALS (MULTIVITAMIN WOMEN 50+) TABS    Take 1 tablet by mouth daily.  NONFORMULARY OR COMPOUNDED ITEM    Take 4 mg by mouth at bedtime. X 2 weeks, then 8 mg nightly- for nerve pain and chronic fatigue- sent in 2 week Rx for 4 mg and 6 months supply for 8 mg- sent to [facility]   NYSTATIN  POWDER    Apply 1 Application topically 2 (two) times daily.   OMEGA-3 ACID ETHYL ESTERS (LOVAZA ) 1 G CAPSULE    Take 2 capsules (2 g total) by mouth 2 (two) times daily.   PANTOPRAZOLE  (PROTONIX ) 40 MG TABLET    Take 1 tablet by mouth once daily   POTASSIUM CHLORIDE  SA (KLOR-CON  M) 20 MEQ TABLET    Take 2 tablets by mouth twice daily   ROSUVASTATIN  (CRESTOR ) 5 MG TABLET    Take 1 tablet (5 mg total) by mouth daily.   SUMATRIPTAN  (IMITREX ) 100 MG TABLET    Take 1 tablet (100 mg total) by mouth every 2 (two) hours as needed for migraine. May repeat in 2 hours if headache persists or recurs. Maximum 2 per day.   TOPIRAMATE  (TOPAMAX ) 25 MG TABLET    Take 1 tablet (25 mg total) by mouth at bedtime.  Modified Medications   No medications on file  Discontinued Medications   MOUNJARO  7.5 MG/0.5ML PEN    INJECT 7.5 MG INTO THE SKIN ONCE A WEEK    Allergies Allergies  Allergen Reactions   Cephalexin  Itching, Swelling and Other (See Comments)    Past Medical History Past Medical History:  Diagnosis Date   Anemia    Anxiety    Arrhythmia    Arthritis    Bipolar depression (HCC) 12/28/2019   Cataract    Chronic fatigue    Chronic headaches    Depression    Diabetes mellitus without complication (HCC)    Fibromyalgia    GERD (gastroesophageal reflux disease)    Heart murmur    High cholesterol    HLD (hyperlipidemia)    Hypertension    Insect bite    Migraine    Prediabetes    Sleep apnea    Thyroid  disease    Urinary incontinence    Vasculitis (HCC)     Past Surgical History Past Surgical History:  Procedure Laterality Date   BIOPSY  09/04/2022    Procedure: BIOPSY;  Surgeon: Shaaron Lamar HERO, MD;  Location: [facility];  Service: Endoscopy;;   CHOLECYSTECTOMY N/A 03/15/2022   Procedure: LAPAROSCOPIC CHOLECYSTECTOMY WITH INTRAOPERATIVE CHOLANGIOGRAM;  Surgeon: Kallie Manuelita BROCKS, MD;  Location: [facility];  Service: General;  Laterality: N/A;   CHOLECYSTECTOMY  02/03/2022   COLONOSCOPY  2017   per patient normal and needs next one in 2027 (Dr. Tobie)   COLONOSCOPY WITH PROPOFOL  N/A 09/04/2022   Procedure: COLONOSCOPY WITH PROPOFOL ;  Surgeon: Shaaron Lamar HERO, MD;  Location: [facility];  Service: Endoscopy;  Laterality: N/A;   ESOPHAGOGASTRODUODENOSCOPY N/A 05/12/2020   Procedure: ESOPHAGOGASTRODUODENOSCOPY (EGD);  Surgeon: Shaaron Lamar HERO, MD;  Location: [facility];  Service: Endoscopy;  Laterality: N/A;  11:15am   Joint fusion on foot Right    LIVER BIOPSY N/A 03/15/2022   Procedure: LIVER BIOPSY;  Surgeon: Kallie Manuelita BROCKS, MD;  Location: [facility];  Service: General;  Laterality: N/A;   MALONEY DILATION N/A 05/12/2020   Procedure: AGAPITO HODGKIN;  Surgeon: Shaaron Lamar HERO, MD;  Location: [facility];  Service: Endoscopy;  Laterality: N/A;   NASAL SEPTUM SURGERY     POLYPECTOMY  09/04/2022   Procedure: POLYPECTOMY;  Surgeon: Shaaron Lamar HERO, MD;  Location: [facility];  Service: Endoscopy;;   SINOSCOPY      Family History family history includes Alcohol abuse in her paternal grandfather; Anxiety disorder in her father, maternal aunt, mother, and paternal grandmother; Arthritis in her maternal grandmother and mother; Autoimmune disease in her brother; Cancer in her maternal aunt; Congestive Heart Failure in her mother; Depression in her father, maternal aunt, and paternal grandmother; Diabetes in her father, maternal grandmother, mother, paternal uncle, and sister; Diabetes Mellitus II in her mother; Hearing loss in her father; Heart attack in her mother; Heart disease in her father, maternal grandfather, mother, and paternal  grandmother; Hyperlipidemia in her father, mother, and sister; Hypertension in her brother, father, mother, and sister; Lupus in her paternal grandmother; Obesity in her father, maternal grandmother, mother, and sister; Stroke in her father and maternal grandfather; Varicose Veins in her mother; Vision loss in her brother.  Social History Social History   Socioeconomic History   Marital status: Married    Spouse name: Not on file   Number of children: 0   Years of education: Not on file   Highest education level: Bachelor's degree (e.g., BA, AB, BS)  Occupational History   Occupation: research associate/drug development  Tobacco Use   Smoking status: Former    Current packs/day: 0.00    Types: Cigarettes    Quit date: 12/30/1982    Years since quitting: 41.5   Smokeless tobacco: Never  Vaping Use   Vaping status: Never Used  Substance and Sexual Activity   Alcohol use: Not Currently    Alcohol/week: 0.0 standard drinks of alcohol   Drug use: Never   Sexual activity: Yes    Birth control/protection: Post-menopausal  Other Topics Concern   Not on file  Social History Narrative   Married for 3 years.Lives with husband.Bachelors in Building surveyor.   Right handed   Caffeine: 1-2 per day    Social Drivers of Health   Financial Resource Strain: Low Risk  (06/14/2024)   Overall Financial Resource Strain (CARDIA)    Difficulty of Paying Living Expenses: Not hard at all  Food Insecurity: No Food Insecurity (06/14/2024)   Hunger Vital Sign    Worried About Running Out of Food in the Last Year: Never true    Ran Out of Food in the Last Year: Never true  Transportation Needs: No Transportation Needs (06/14/2024)   PRAPARE - Administrator, Civil Service (Medical): No    Lack of Transportation (Non-Medical): No  Physical Activity: Inactive (06/14/2024)   Exercise Vital Sign    Days of Exercise per Week: 0 days    Minutes of Exercise per Session: 0 min  Stress: Stress Concern  Present (06/14/2024)   [employer] of Occupational Health - Occupational Stress Questionnaire    Feeling of Stress: To some extent  Social Connections: Moderately Isolated (06/14/2024)   Social Connection and Isolation Panel    Frequency of Communication with Friends and Family: More than three times a week    Frequency of Social Gatherings with Friends and Family: Once a week    Attends Religious Services: Never    Database administrator or Organizations: No    Attends Banker Meetings: Never    Marital Status: Married  Catering manager Violence: Not At Risk (06/14/2024)   Humiliation, Afraid, Rape, and Kick questionnaire    Fear of Current or Ex-Partner: No    Emotionally Abused: No  Physically Abused: No    Sexually Abused: No    Lab Results  Component Value Date   HGBA1C 6.8 (H) 03/25/2024   HGBA1C 7.2 (H) 08/11/2023   HGBA1C 7.2 (H) 04/29/2023   Lab Results  Component Value Date   CHOL 136 03/25/2024   Lab Results  Component Value Date   HDL 35 (L) 03/25/2024   Lab Results  Component Value Date   LDLCALC 67 03/25/2024   Lab Results  Component Value Date   TRIG 203 (H) 03/25/2024   Lab Results  Component Value Date   CHOLHDL 3.9 03/25/2024   Lab Results  Component Value Date   CREATININE 1.03 (H) 07/14/2024   Lab Results  Component Value Date   GFR 57.44 (L) 08/11/2023   Lab Results  Component Value Date   MICROALBUR 1.1 12/18/2020      Component Value Date/Time   NA 138 07/14/2024 2230   NA 141 03/25/2024 1643   K 2.8 (L) 07/14/2024 2230   CL 98 07/14/2024 2230   CO2 27 07/14/2024 2230   GLUCOSE 95 07/14/2024 2230   BUN 20 07/14/2024 2230   BUN 14 03/25/2024 1643   CREATININE 1.03 (H) 07/14/2024 2230   CREATININE 1.03 03/29/2022 1324   CALCIUM  8.4 (L) 07/14/2024 2230   PROT 6.3 (L) 07/14/2024 2230   PROT 6.4 03/25/2024 1643   ALBUMIN 3.5 07/14/2024 2230   ALBUMIN 4.1 03/25/2024 1643   AST 23 07/14/2024 2230   ALT 29  07/14/2024 2230   ALKPHOS 70 07/14/2024 2230   BILITOT 1.5 (H) 07/14/2024 2230   BILITOT 0.4 03/25/2024 1643   GFRNONAA >60 07/14/2024 2230   GFRNONAA 77 03/19/2021 1457   GFRAA 89 03/19/2021 1457      Latest Ref Rng & Units 07/14/2024   10:30 PM 03/25/2024    4:43 PM 11/24/2023    3:38 PM  BMP  Glucose 70 - 99 mg/dL 95  870  98   BUN 6 - 20 mg/dL 20  14  15    Creatinine 0.44 - 1.00 mg/dL 8.96  9.07  8.95   BUN/Creat Ratio 9 - 23  15  14    Sodium 135 - 145 mmol/L 138  141  141   Potassium 3.5 - 5.1 mmol/L 2.8  3.6  4.5   Chloride 98 - 111 mmol/L 98  101  100   CO2 22 - 32 mmol/L 27  22  23    Calcium  8.9 - 10.3 mg/dL 8.4  9.5  9.8        Component Value Date/Time   WBC 10.3 07/14/2024 2230   RBC 5.13 (H) 07/14/2024 2230   HGB 16.3 (H) 07/14/2024 2230   HGB 14.1 11/24/2023 1538   HCT 48.2 (H) 07/14/2024 2230   HCT 45.3 11/24/2023 1538   PLT 279 07/14/2024 2230   PLT 389 11/24/2023 1538   MCV 94.0 07/14/2024 2230   MCV 84 11/24/2023 1538   MCH 31.8 07/14/2024 2230   MCHC 33.8 07/14/2024 2230   RDW 13.6 07/14/2024 2230   RDW 15.8 (H) 11/24/2023 1538   LYMPHSABS 2.6 07/14/2024 2230   LYMPHSABS 2.3 11/24/2023 1538   MONOABS 0.8 07/14/2024 2230   EOSABS 0.7 (H) 07/14/2024 2230   EOSABS 0.4 11/24/2023 1538   BASOSABS 0.0 07/14/2024 2230   BASOSABS 0.1 11/24/2023 1538     Parts of this note may have been dictated using voice recognition software. There may be variances in spelling and vocabulary which are unintentional. Not  all errors are proofread. Please notify the dino if any discrepancies are noted or if the meaning of any statement is not clear.

## 2024-07-15 NOTE — Discharge Instructions (Signed)
 Begin taking Lomotil  as prescribed as needed for diarrhea.  Clear liquids for the next 12 hours, then slowly advance diet as tolerated.  Follow-up with primary doctor if symptoms are not improving in the next few days.

## 2024-07-15 NOTE — ED Provider Notes (Signed)
  Physical Exam  BP (!) 104/49   Pulse 89   Temp 98.1 F (36.7 C) (Oral)   Resp 13   Ht 5' 9 (1.753 m)   Wt 117.9 kg   LMP  (LMP Unknown)   SpO2 94%   BMI 38.40 kg/m   Physical Exam Vitals and nursing note reviewed.  Constitutional:      General: She is not in acute distress.    Appearance: She is well-developed. She is not diaphoretic.  HENT:     Head: Normocephalic and atraumatic.  Pulmonary:     Effort: Pulmonary effort is normal.  Abdominal:     General: Bowel sounds are normal. There is no distension.     Palpations: Abdomen is soft.     Tenderness: There is no abdominal tenderness.  Musculoskeletal:        General: Normal range of motion.     Cervical back: Normal range of motion and neck supple.  Skin:    General: Skin is warm and dry.  Neurological:     General: No focal deficit present.     Mental Status: She is alert and oriented to person, place, and time.     Procedures  Procedures  ED Course / MDM   Clinical Course as of 07/15/24 0100  Wed Jul 14, 2024  2332 Signed out to Dr. Geroldine pending CT scan, labs.  [WS]    Clinical Course User Index [WS] Francesca Elsie CROME, MD   Medical Decision Making Amount and/or Complexity of Data Reviewed Labs: ordered. Radiology: ordered.  Risk Prescription drug management.   Care assumed from Dr. Francesca at shift change.  Patient awaiting results of laboratory studies and CT scan.  She has had diarrhea for the past several days along with vomiting.  Her studies have returned and show only hypokalemia with potassium of 2.8.  Laboratory studies are otherwise unremarkable and CT scan of the abdomen and pelvis shows no acute process.  I highly suspect either a viral or foodborne illness.  Either way I suspect this to be self-limited and feels the patient can safely be discharged.  I did give 1 dose of Lomotil  here and will discharge her with a small quantity of this to take at home if her diarrhea  persists.       Geroldine Berg, MD 07/15/24 [phone]

## 2024-07-16 ENCOUNTER — Other Ambulatory Visit: Payer: Self-pay | Admitting: Internal Medicine

## 2024-07-16 ENCOUNTER — Encounter: Payer: Self-pay | Admitting: "Endocrinology

## 2024-07-16 ENCOUNTER — Ambulatory Visit: Admitting: Internal Medicine

## 2024-07-16 ENCOUNTER — Encounter: Payer: Self-pay | Admitting: Internal Medicine

## 2024-07-16 DIAGNOSIS — E876 Hypokalemia: Secondary | ICD-10-CM

## 2024-07-19 ENCOUNTER — Other Ambulatory Visit: Payer: Self-pay

## 2024-07-19 DIAGNOSIS — E1142 Type 2 diabetes mellitus with diabetic polyneuropathy: Secondary | ICD-10-CM

## 2024-07-20 ENCOUNTER — Inpatient Hospital Stay

## 2024-07-20 VITALS — BP 137/79 | HR 85 | Temp 98.0°F | Resp 18 | Wt 266.0 lb

## 2024-07-20 DIAGNOSIS — D509 Iron deficiency anemia, unspecified: Secondary | ICD-10-CM | POA: Diagnosis not present

## 2024-07-20 DIAGNOSIS — E611 Iron deficiency: Secondary | ICD-10-CM

## 2024-07-20 LAB — GLUCOSE, CAPILLARY: Glucose-Capillary: 286 mg/dL — ABNORMAL HIGH (ref 70–99)

## 2024-07-20 MED ORDER — SODIUM CHLORIDE 0.9 % IV SOLN
1000.0000 mg | Freq: Once | INTRAVENOUS | Status: AC
  Administered 2024-07-20: 1000 mg via INTRAVENOUS
  Filled 2024-07-20: qty 20

## 2024-07-20 MED ORDER — INSULIN ASPART 100 UNIT/ML IJ SOLN
10.0000 [IU] | Freq: Once | INTRAMUSCULAR | Status: AC
  Administered 2024-07-20: 10 [IU] via SUBCUTANEOUS
  Filled 2024-07-20: qty 1

## 2024-07-20 MED ORDER — ACETAMINOPHEN 325 MG PO TABS
650.0000 mg | ORAL_TABLET | Freq: Once | ORAL | Status: AC
  Administered 2024-07-20: 650 mg via ORAL
  Filled 2024-07-20: qty 2

## 2024-07-20 MED ORDER — METHYLPREDNISOLONE SODIUM SUCC 125 MG IJ SOLR
125.0000 mg | Freq: Once | INTRAMUSCULAR | Status: AC
  Administered 2024-07-20: 125 mg via INTRAVENOUS
  Filled 2024-07-20: qty 2

## 2024-07-20 MED ORDER — SODIUM CHLORIDE 0.9 % IV SOLN: INTRAVENOUS | Status: DC

## 2024-07-20 MED ORDER — CETIRIZINE HCL 10 MG/ML IV SOLN
10.0000 mg | Freq: Once | INTRAVENOUS | Status: AC
  Administered 2024-07-20: 10 mg via INTRAVENOUS
  Filled 2024-07-20: qty 1

## 2024-07-20 MED ORDER — FAMOTIDINE IN NACL 20-0.9 MG/50ML-% IV SOLN
20.0000 mg | Freq: Once | INTRAVENOUS | Status: AC
  Administered 2024-07-20: 20 mg via INTRAVENOUS
  Filled 2024-07-20: qty 50

## 2024-07-20 NOTE — Progress Notes (Signed)
 Patient presents today for iron  infusion.  Patient is in satisfactory condition with no new complaints voiced.  Vital signs are stable.  IV placed in L arm.  IV flushed well with good blood return noted.  We will proceed with infusion per provider orders.    Patient notified RN that glucose was 301 on Dexcom.  NT performed finger stick glucose that was 286.  Patient requested insulin .  Dr. Davonna made aware.  We will give Novolog  10 units SQ x one dose today per Dr. Davonna.  Patient tolerated infusion well with no complaints voiced.  Patient left ambulatory in stable condition.  Vital signs stable at discharge.  Follow up as scheduled.

## 2024-07-20 NOTE — Patient Instructions (Signed)
 [facility] [place] - A DEPT OF [facility]  Discharge Instructions: Thank you for choosing [facility] to provide your oncology and hematology care.  If you have a lab appointment with the Cancer Center - please note that after April 8th, 2024, all labs will be drawn in the cancer center.  You do not have to check in or register with the main entrance as you have in the past but will complete your check-in in the cancer center.  Wear comfortable clothing and clothing appropriate for easy access to any Portacath or PICC line.   We strive to give you quality time with your provider. You may need to reschedule your appointment if you arrive late (15 or more minutes).  Arriving late affects you and other patients whose appointments are after yours.  Also, if you miss three or more appointments without notifying the office, you may be dismissed from the clinic at the provider's discretion.      For prescription refill requests, have your pharmacy contact our office and allow 72 hours for refills to be completed.    Today you received the following:  Infed.  Iron Dextran Injection What is this medication? IRON DEXTRAN (EYE ern DEX tran) treats low levels of iron in your body. Iron is a mineral that plays an important role in making red blood cells, which carry oxygen from your lungs to the rest of your body. This medicine may be used for other purposes; ask your health care provider or pharmacist if you have questions. COMMON BRAND NAME(S): Dexferrum, INFeD What should I tell my care team before I take this medication? They need to know if you have any of these conditions: Anemia not caused by low iron levels Heart disease High levels of iron in the blood Kidney disease Liver disease An unusual or allergic reaction to iron, other medications, foods, dyes, or preservatives Pregnant or trying to get pregnant Breastfeeding How should I use this  medication? This medication is injected into a vein or a muscle. It is given by your care team in a hospital or clinic setting. Talk to your care team about the use of this medication in children. While it may be prescribed for children as young as 4 months for selected conditions, precautions do apply. Overdosage: If you think you have taken too much of this medicine contact a poison control center or emergency room at once. NOTE: This medicine is only for you. Do not share this medicine with others. What if I miss a dose? Keep appointments for follow-up doses. It is important not to miss your dose. Call your care team if you are unable to keep an appointment. What may interact with this medication? Do not take this medication with any of the following: Deferoxamine Dimercaprol Other iron products This medication may also interact with the following: Chloramphenicol Deferasirox This list may not describe all possible interactions. Give your health care provider a list of all the medicines, herbs, non-prescription drugs, or dietary supplements you use. Also tell them if you smoke, drink alcohol, or use illegal drugs. Some items may interact with your medicine. What should I watch for while using this medication? Visit your care team for regular checks on your progress. Tell your care team if your symptoms do not start to get better or if they get worse. You may need blood work while taking this medication. You may need to eat more foods that contain iron. Talk  to your care team. Foods that contain iron include whole grains/cereals, dried fruits, beans, peas, leafy green vegetables, and organ meats (liver, kidney). Long-term use of this medication may increase your risk of some cancers. Talk to your care team about your risk of cancer. What side effects may I notice from receiving this medication? Side effects that you should report to your care team as soon as possible: Allergic  reactions--skin rash, itching, hives, swelling of the face, lips, tongue, or throat Low blood pressure--dizziness, feeling faint or lightheaded, blurry vision Shortness of breath Side effects that usually do not require medical attention (report to your care team if they continue or are bothersome): Flushing Headache Joint pain Muscle pain Nausea Pain, redness, or irritation at injection site This list may not describe all possible side effects. Call your doctor for medical advice about side effects. You may report side effects to FDA at 1-800-FDA-1088. Where should I keep my medication? This medication is given in a hospital or clinic. It will not be stored at home. NOTE: This sheet is a summary. It may not cover all possible information. If you have questions about this medicine, talk to your doctor, pharmacist, or health care provider.  2024 Elsevier/Gold Standard (2023-08-06 00:00:00)    To help prevent nausea and vomiting after your treatment, we encourage you to take your nausea medication as directed.  BELOW ARE SYMPTOMS THAT SHOULD BE REPORTED IMMEDIATELY: *FEVER GREATER THAN 100.4 F (38 C) OR HIGHER *CHILLS OR SWEATING *NAUSEA AND VOMITING THAT IS NOT CONTROLLED WITH YOUR NAUSEA MEDICATION *UNUSUAL SHORTNESS OF BREATH *UNUSUAL BRUISING OR BLEEDING *URINARY PROBLEMS (pain or burning when urinating, or frequent urination) *BOWEL PROBLEMS (unusual diarrhea, constipation, pain near the anus) TENDERNESS IN MOUTH AND THROAT WITH OR WITHOUT PRESENCE OF ULCERS (sore throat, sores in mouth, or a toothache) UNUSUAL RASH, SWELLING OR PAIN  UNUSUAL VAGINAL DISCHARGE OR ITCHING   Items with * indicate a potential emergency and should be followed up as soon as possible or go to the Emergency Department if any problems should occur.  Please show the CHEMOTHERAPY ALERT CARD or IMMUNOTHERAPY ALERT CARD at check-in to the Emergency Department and triage nurse.  Should you have questions  after your visit or need to cancel or reschedule your appointment, please contact [facility] [place] - A DEPT OF [facility] [phone]  and follow the prompts.  Office hours are 8:00 a.m. to 4:30 p.m. Monday - Friday. Please note that voicemails left after 4:00 p.m. may not be returned until the following business day.  We are closed weekends and major holidays. You have access to a nurse at all times for urgent questions. Please call the main number to the clinic [phone] and follow the prompts.  For any non-urgent questions, you may also contact your provider using MyChart. We now offer e-Visits for anyone 32 and older to request care online for non-urgent symptoms. For details visit [email or web address].   Also download the MyChart app! Go to the app store, search "MyChart", open the app, select [place], and log in with your MyChart username and password.

## 2024-07-28 NOTE — Telephone Encounter (Signed)
 This encounter needs to be closed and the front staff cannot do this.

## 2024-07-29 ENCOUNTER — Telehealth: Payer: Self-pay | Admitting: *Deleted

## 2024-07-29 NOTE — Telephone Encounter (Signed)
 [facility] is requesting refills on Naltrexone 8 mg capsule.

## 2024-07-29 NOTE — Telephone Encounter (Signed)
 Called in Low dose naltrexone 8 mg at bedtime- wrote for a total of 3 months since pt hasn't seen me since January- she needs to make appointment NOW so she is able ot see me before she gets a refill- thanks- ML

## 2024-07-30 NOTE — Telephone Encounter (Signed)
 Notified of med refill and she is scheduling an appointment.

## 2024-08-03 ENCOUNTER — Ambulatory Visit: Admitting: Internal Medicine

## 2024-08-03 ENCOUNTER — Ambulatory Visit: Admitting: General Surgery

## 2024-08-04 ENCOUNTER — Ambulatory Visit: Admitting: General Surgery

## 2024-08-04 ENCOUNTER — Other Ambulatory Visit: Payer: Self-pay | Admitting: Internal Medicine

## 2024-08-04 ENCOUNTER — Encounter: Payer: Self-pay | Admitting: General Surgery

## 2024-08-04 VITALS — BP 105/70 | HR 83 | Temp 98.0°F | Resp 14 | Ht 69.0 in | Wt 266.0 lb

## 2024-08-04 DIAGNOSIS — M7989 Other specified soft tissue disorders: Secondary | ICD-10-CM

## 2024-08-04 DIAGNOSIS — K429 Umbilical hernia without obstruction or gangrene: Secondary | ICD-10-CM | POA: Insufficient documentation

## 2024-08-04 NOTE — Patient Instructions (Signed)
 Decide if you want to proceed with open repair with permanent sutures.  If you decide you want surgery, you will need to hold your Jardiance  for 3 days and Mounjaro  for 7 days.

## 2024-08-04 NOTE — Progress Notes (Signed)
 [facility] History and Physical  Reason for Referral: Umbilical hernia  Referring Physician:  Self referral   Chief Complaint   Hernia     Sydney Taylor is a 58 y.o. female.  HPI:   Sydney Taylor is a 58 yo who comes in with umbilical hernia. She was at the ED with a GI virus in July and complaints of nausea/vomiting and diarrhea. She was worked up with a CT scan and noted to have an umbilical hernia. She says that after her cholecystectomy in 2023 that she had felt a bulge but that it did not cause much discomfort. It the area was palpated or touched she did have some discomfort. She came in after the Ed visit to discuss the hernia further.   Past Medical History:  Diagnosis Date   Anemia    Anxiety    Arrhythmia    Arthritis    Bipolar depression (HCC) 12/28/2019   Cataract    Chronic fatigue    Chronic headaches    Depression    Diabetes mellitus without complication (HCC)    Fibromyalgia    GERD (gastroesophageal reflux disease)    Heart murmur    High cholesterol    HLD (hyperlipidemia)    Hypertension    Insect bite    Migraine    Prediabetes    Sleep apnea    Thyroid  disease    Urinary incontinence    Vasculitis (HCC)     Past Surgical History:  Procedure Laterality Date   BIOPSY  09/04/2022   Procedure: BIOPSY;  Surgeon: Shaaron Lamar HERO, MD;  [facility];  Service: Endoscopy;;   CHOLECYSTECTOMY N/A 03/15/2022   Procedure: LAPAROSCOPIC CHOLECYSTECTOMY WITH INTRAOPERATIVE CHOLANGIOGRAM;  Surgeon: Kallie Manuelita BROCKS, MD;  Location: [facility];  Service: General;  Laterality: N/A;   CHOLECYSTECTOMY  02/03/2022   COLONOSCOPY  2017   per patient normal and needs next one in 2027 (Dr. Tobie)   COLONOSCOPY WITH PROPOFOL  N/A 09/04/2022   Procedure: COLONOSCOPY WITH PROPOFOL ;  Surgeon: Shaaron Lamar HERO, MD;  Location: [facility];  Service: Endoscopy;  Laterality: N/A;   ESOPHAGOGASTRODUODENOSCOPY N/A 05/12/2020   Procedure:  ESOPHAGOGASTRODUODENOSCOPY (EGD);  Surgeon: Shaaron Lamar HERO, MD;  Location[facility];  Service: Endoscopy;  Laterality: N/A;  11:15am   Joint fusion on foot Right    LIVER BIOPSY N/A 03/15/2022   Procedure: LIVER BIOPSY;  Surgeon: Kallie Manuelita BROCKS, MD;  Location: [facility];  Service: General;  Laterality: N/A;   MALONEY DILATION N/A 05/12/2020   Procedure: AGAPITO HODGKIN;  Surgeon: Shaaron Lamar HERO, MD;  [facility];  Service: Endoscopy;  Laterality: N/A;   NASAL SEPTUM SURGERY     POLYPECTOMY  09/04/2022   Procedure: POLYPECTOMY;  Surgeon: Shaaron Lamar HERO, MD;  [facility];  Service: Endoscopy;;   SINOSCOPY      Family History  Problem Relation Age of Onset   Hypertension Father    Hyperlipidemia Father    Diabetes Father    Stroke Father    Depression Father    Anxiety disorder Father    Hearing loss Father    Heart disease Father    Obesity Father    Hypertension Mother    Hyperlipidemia Mother    Diabetes Mellitus II Mother    Congestive Heart Failure Mother    Heart attack Mother    Anxiety disorder Mother    Arthritis Mother    Diabetes Mother  Heart disease Mother    Obesity Mother    Varicose Veins Mother    Hypertension Sister    Diabetes Sister    Hyperlipidemia Sister    Obesity Sister    Hypertension Brother    Autoimmune disease Brother    Vision loss Brother    Diabetes Maternal Grandmother    Arthritis Maternal Grandmother    Obesity Maternal Grandmother    Heart disease Maternal Grandfather    Stroke Maternal Grandfather    Lupus Paternal Grandmother    Depression Paternal Grandmother    Anxiety disorder Paternal Grandmother    Heart disease Paternal Grandmother    Alcohol abuse Paternal Grandfather    Depression Maternal Aunt    Anxiety disorder Maternal Aunt    Cancer Maternal Aunt    Diabetes Paternal Uncle    Colon cancer Neg Hx    Esophageal cancer Neg Hx    Gastric cancer Neg Hx     Social History    Tobacco Use   Smoking status: Former    Current packs/day: 0.00    Types: Cigarettes    Quit date: 12/30/1982    Years since quitting: 41.6   Smokeless tobacco: Never  Vaping Use   Vaping status: Never Used  Substance Use Topics   Alcohol use: Not Currently    Alcohol/week: 0.0 standard drinks of alcohol   Drug use: Never    Medications: I have reviewed the patient's current medications. Allergies as of 08/04/2024       Reactions   Cephalexin  Itching, Swelling, Other (See Comments)        Medication List        Accurate as of August 04, 2024 10:15 AM. If you have any questions, ask your nurse or doctor.          Accu-Chek Guide test strip Generic drug: glucose blood 1 each by Other route in the morning, at noon, and at bedtime. Use as instructed   ALPRAZolam 0.5 MG tablet Commonly known as: XANAX Take 0.25 mg by mouth daily as needed for anxiety.   ARIPiprazole 2 MG tablet Commonly known as: ABILIFY Take 2 mg by mouth daily.   B-12 Methylcobalamin 1000 MCG Tbdp Take 1,000 mcg by mouth daily.   blood glucose meter kit and supplies Kit Inject 1 each into the skin daily. Check blood glucose once a day. Diagnosis code: E 11.9.   calcium  carbonate 500 MG chewable tablet Commonly known as: TUMS - dosed in mg elemental calcium  Chew 1,000 mg by mouth daily as needed for indigestion or heartburn.   chlorthalidone  25 MG tablet Commonly known as: HYGROTON  TAKE 1 TABLET BY MOUTH ONCE DAILY What changed: how much to take Changed by: Suzzane MARLA Blanch   cyclobenzaprine  5 MG tablet Commonly known as: FLEXERIL  Take 1 tablet (5 mg total) by mouth 2 (two) times daily as needed.   Dexcom G7 Receiver Devi 1 each by Does not apply route continuous.   Dexcom G7 Sensor Misc Use to check glucose continuously, change sensor every 10 days   diphenoxylate -atropine  2.5-0.025 MG tablet Commonly known as: Lomotil  Take 1 tablet by mouth 4 (four) times daily as needed for  diarrhea or loose stools.   DULoxetine 60 MG capsule Commonly known as: CYMBALTA Take 120 mg by mouth every morning. Two capsules (120mg ) daily   gabapentin  300 MG capsule Commonly known as: Neurontin  Take 1 capsule (300 mg total) by mouth at bedtime. X 1 week then can increase to 600 mg  QHS if possible- for FMS   Tresiba  FlexTouch 200 UNIT/ML FlexTouch Pen Generic drug: insulin  degludec Inject 86 Units into the skin 2 (two) times daily.   Insulin  Degludec FlexTouch 200 UNIT/ML Sopn 86 units qam, 84 units qpm   insulin  lispro 100 UNIT/ML KwikPen Commonly known as: HumaLOG  KwikPen Inject 1-5 Units into the skin as needed.   Jardiance  25 MG Tabs tablet Generic drug: empagliflozin  TAKE 1 TABLET BY MOUTH ONCE DAILY BEFORE BREAKFAST   L-Methylfolate 15 MG Tabs Take 15 mg by mouth daily at 6 (six) AM.   losartan  25 MG tablet Commonly known as: COZAAR  Take 1 tablet (25 mg total) by mouth daily.   metFORMIN  500 MG 24 hr tablet Commonly known as: GLUCOPHAGE -XR Take 2 tablets (1,000 mg total) by mouth daily with supper.   metoprolol  tartrate 25 MG tablet Commonly known as: LOPRESSOR  Take 1 tablet by mouth twice daily   modafinil 100 MG tablet Commonly known as: PROVIGIL Take 100 mg by mouth every morning.   Multivitamin Women 50+ Tabs Take 1 tablet by mouth daily.   N-Acetyl Cysteine 600 MG Caps Generic drug: Acetylcysteine Take 1 capsule by mouth daily.   NONFORMULARY OR COMPOUNDED ITEM Take 4 mg by mouth at bedtime. X 2 weeks, then 8 mg nightly- for nerve pain and chronic fatigue- sent in 2 week Rx for 4 mg and 6 months supply for 8 mg- sent to [facility] What changed:  how much to take additional instructions   nystatin  powder Commonly known as: nystatin  Apply 1 Application topically 2 (two) times daily.   omega-3 acid ethyl esters 1 g capsule Commonly known as: LOVAZA  Take 2 capsules (2 g total) by mouth 2 (two) times daily.   pantoprazole  40 MG  tablet Commonly known as: PROTONIX  Take 1 tablet by mouth once daily   potassium chloride  SA 20 MEQ tablet Commonly known as: KLOR-CON  M Take 2 tablets by mouth twice daily   ReliOn Pen Needles 31G X 6 MM Misc Generic drug: Insulin  Pen Needle USE 2 PEN NEEDLEs to check blood glucose twice a day   Pen Needles 32G X 4 MM Misc Check BS 2 x a day   rosuvastatin  5 MG tablet Commonly known as: Crestor  Take 1 tablet (5 mg total) by mouth daily.   SUMAtriptan  100 MG tablet Commonly known as: IMITREX  Take 1 tablet (100 mg total) by mouth every 2 (two) hours as needed for migraine. May repeat in 2 hours if headache persists or recurs. Maximum 2 per day.   tirzepatide  5 MG/0.5ML Pen Commonly known as: MOUNJARO  Inject 5 mg into the skin once a week.   topiramate  25 MG tablet Commonly known as: TOPAMAX  Take 1 tablet (25 mg total) by mouth at bedtime.         ROS:  A comprehensive review of systems was negative except for: Gastrointestinal: positive for abdominal pain  Blood pressure 105/70, pulse 83, temperature 98 F (36.7 C), temperature source Oral, resp. rate 14, height 5' 9 (1.753 m), weight 266 lb (120.7 kg), SpO2 93%. Physical Exam Vitals reviewed.  HENT:     Head: Normocephalic.     Nose: Nose normal.  Cardiovascular:     Rate and Rhythm: Normal rate.  Pulmonary:     Effort: Pulmonary effort is normal.  Abdominal:     General: There is no distension.     Palpations: Abdomen is soft.     Tenderness: There is abdominal tenderness.  Hernia: A hernia is present.     Comments: Minor discomfort, reducible hernia with fat   Musculoskeletal:        General: Normal range of motion.  Skin:    General: Skin is warm.  Neurological:     General: No focal deficit present.     Mental Status: She is alert and oriented to person, place, and time.  Psychiatric:        Mood and Affect: Mood normal.        Behavior: Behavior normal.     Results: CT 06/2024 reviewed and  reviewed CT 01/2021 with patient- umbilical hernia noted on both with slightly larger on 06/2024. Defect likely <1cm and has fat in the hernia    Assessment and Plan:  Sydney Taylor is a 58 y.o. female with an umbilical hernia. This was present before her cholecystectomy but could have been made larger or worse after the procedure and the supraumbilical port or just with time /weight changes. Discussed the umbilical hernia, discussed worrisome features that would cause us  to need to repair including worsening pain, bulge. Discussed that she can monitor this but depends on her discomfort. Discussed that with the size now it would likely be a repair with permanent sutures. Discussed that she is weary of any mesh and would want to avoid anyway. Discussed that she is going to think about her options.   Discussed risk of bleeding, infection, recurrence. Discussed that when we can use mesh we do normally to reduce risk of recurrence, and that mesh complaints are rare but do happen. She had a family member with issues /complications after a mesh.   Decide if you want to proceed with open repair with permanent sutures.  If you decide you want surgery, you will need to hold your Jardiance  for 3 days and Mounjaro  for 7 days. All questions were answered to the satisfaction of the patient.  Would plan for open hernia repair /primary repair given size and patient's concern for mesh.    Manuelita JAYSON Pander 08/04/2024, 10:15 AM

## 2024-08-05 ENCOUNTER — Encounter: Payer: Self-pay | Admitting: "Endocrinology

## 2024-08-05 ENCOUNTER — Ambulatory Visit: Payer: Self-pay | Admitting: Internal Medicine

## 2024-08-05 LAB — BASIC METABOLIC PANEL WITH GFR
BUN/Creatinine Ratio: 10 (ref 9–23)
BUN: 9 mg/dL (ref 6–24)
CO2: 23 mmol/L (ref 20–29)
Calcium: 9.4 mg/dL (ref 8.7–10.2)
Chloride: 102 mmol/L (ref 96–106)
Creatinine, Ser: 0.94 mg/dL (ref 0.57–1.00)
Glucose: 138 mg/dL — ABNORMAL HIGH (ref 70–99)
Potassium: 3.9 mmol/L (ref 3.5–5.2)
Sodium: 141 mmol/L (ref 134–144)
eGFR: 70 mL/min/1.73 (ref >59)

## 2024-08-05 LAB — HEMOGLOBIN A1C
Est. average glucose Bld gHb Est-mCnc: 146 mg/dL
Hgb A1c MFr Bld: 6.7 % — ABNORMAL HIGH (ref 4.8–5.6)

## 2024-08-05 LAB — MAGNESIUM: Magnesium: 2.1 mg/dL (ref 1.6–2.3)

## 2024-08-12 ENCOUNTER — Other Ambulatory Visit: Payer: Self-pay | Admitting: "Endocrinology

## 2024-08-12 DIAGNOSIS — Z794 Long term (current) use of insulin: Secondary | ICD-10-CM

## 2024-08-12 NOTE — Telephone Encounter (Signed)
 Requested Prescriptions   Pending Prescriptions Disp Refills   JARDIANCE  25 MG TABS tablet [Pharmacy Med Name: Jardiance  25 MG Oral Tablet] 30 tablet 0    Sig: TAKE 1 TABLET BY MOUTH ONCE DAILY BEFORE BREAKFAST

## 2024-08-31 ENCOUNTER — Encounter: Payer: Self-pay | Admitting: Internal Medicine

## 2024-08-31 ENCOUNTER — Other Ambulatory Visit: Payer: Self-pay | Admitting: Internal Medicine

## 2024-08-31 DIAGNOSIS — J309 Allergic rhinitis, unspecified: Secondary | ICD-10-CM

## 2024-08-31 DIAGNOSIS — J329 Chronic sinusitis, unspecified: Secondary | ICD-10-CM

## 2024-08-31 MED ORDER — BUDESONIDE 0.5 MG/2ML IN SUSP: RESPIRATORY_TRACT | 2 refills | Status: AC

## 2024-09-01 ENCOUNTER — Ambulatory Visit: Payer: Self-pay

## 2024-09-01 MED ORDER — AZITHROMYCIN 250 MG PO TABS: ORAL_TABLET | ORAL | 0 refills | Status: AC

## 2024-09-01 NOTE — Telephone Encounter (Signed)
 FYI Only or Action Required?: FYI only for provider.  Patient was last seen in primary care on 06/16/2024 by Tobie Suzzane POUR, MD.  Called Nurse Triage reporting Cough.  Symptoms began yesterday.  Interventions attempted: OTC medications: dayquil.  Symptoms are: gradually worsening.  Triage Disposition: Home Care  Patient/caregiver understands and will follow disposition?: Yes  Copied from CRM #[id number]. Topic: Clinical - Red Word Triage >> Sep 01, 2024 12:41 PM Fonda T wrote: [facility] that prompted transfer to Nurse Triage: Received call from patient reports she is having  Coughing, hoarseness, green discharge/mucous, a sweaty last night, but no fever at present.  Patient reports took home COVID test, results was negative as of this morning. Reason for Disposition  Cough with cold symptoms (e.g., runny nose, postnasal drip, throat clearing)  Answer Assessment - Initial Assessment Questions 1. ONSET: When did the cough begin?      Two days ago 2. SEVERITY: How bad is the cough today?      worsening 3. SPUTUM: Describe the color of your sputum (e.g., none, dry cough; clear, white, yellow, green)     green 4. HEMOPTYSIS: Are you coughing up any blood? If Yes, ask: How much? (e.g., flecks, streaks, tablespoons, etc.)     denies 5. DIFFICULTY BREATHING: Are you having difficulty breathing? If Yes, ask: How bad is it? (e.g., mild, moderate, severe)      denies 6. FEVER: Do you have a fever? If Yes, ask: What is your temperature, how was it measured, and when did it start?     99.4 last night 7. CARDIAC HISTORY: Do you have any history of heart disease? (e.g., heart attack, congestive heart failure)      denies 8. LUNG HISTORY: Do you have any history of lung disease?  (e.g., pulmonary embolus, asthma, emphysema)     denies 9. PE RISK FACTORS: Do you have a history of blood clots? (or: recent major surgery, recent prolonged travel, bedridden)     N/a 10.  OTHER SYMPTOMS: Do you have any other symptoms? (e.g., runny nose, wheezing, chest pain)       Runny nose, sinus congestion 11. PREGNANCY: Is there any chance you are pregnant? When was your last menstrual period?       N/a 12. TRAVEL: Have you traveled out of the country in the last month? (e.g., travel history, exposures)       N/a  Protocols used: Cough - Acute Non-Productive-A-AH

## 2024-09-01 NOTE — Addendum Note (Signed)
 Addended byBETHA TOBIE DOWNS on: 09/01/2024 12:48 PM   Modules accepted: Orders

## 2024-09-02 ENCOUNTER — Ambulatory Visit (INDEPENDENT_AMBULATORY_CARE_PROVIDER_SITE_OTHER): Admitting: Internal Medicine

## 2024-09-02 ENCOUNTER — Encounter: Payer: Self-pay | Admitting: Internal Medicine

## 2024-09-02 VITALS — BP 113/74 | HR 87 | Ht 69.0 in | Wt 261.6 lb

## 2024-09-02 DIAGNOSIS — J329 Chronic sinusitis, unspecified: Secondary | ICD-10-CM

## 2024-09-02 DIAGNOSIS — J3089 Other allergic rhinitis: Secondary | ICD-10-CM | POA: Diagnosis not present

## 2024-09-02 MED ORDER — BENZONATATE 200 MG PO CAPS: 200.0000 mg | ORAL_CAPSULE | Freq: Two times a day (BID) | ORAL | 1 refills | Status: DC | PRN

## 2024-09-02 NOTE — Progress Notes (Unsigned)
 Acute Office Visit  Subjective:    Patient ID: Sydney Taylor, female    DOB: 10/04/1966, 58 y.o.   MRN: [id number]  Chief Complaint  Patient presents with   Cough    Pt reports sx of cough and sinus and congestion.    HPI Patient is in today for complaint of nasal congestion, postnasal drip and sore throat for the last 4 days.  She has history of chronic sinusitis, but her symptoms have been worse recently.  Denies any fever or chills.  She tested negative for COVID at home.  She denies dyspnea or wheezing currently.    Past Medical History:  Diagnosis Date   Anemia    Anxiety    Arrhythmia    Arthritis    Bipolar depression (HCC) 12/28/2019   Cataract    Chronic fatigue    Chronic headaches    Depression    Diabetes mellitus without complication (HCC)    Fibromyalgia    GERD (gastroesophageal reflux disease)    Heart murmur    High cholesterol    HLD (hyperlipidemia)    Hypertension    Insect bite    Migraine    Prediabetes    Sleep apnea    Thyroid  disease    Urinary incontinence    Vasculitis (HCC)     Past Surgical History:  Procedure Laterality Date   BIOPSY  09/04/2022   Procedure: BIOPSY;  Surgeon: Shaaron Lamar HERO, MD;  Location: [facility];  Service: Endoscopy;;   CHOLECYSTECTOMY N/A 03/15/2022   Procedure: LAPAROSCOPIC CHOLECYSTECTOMY WITH INTRAOPERATIVE CHOLANGIOGRAM;  Surgeon: Kallie Manuelita BROCKS, MD;  Location: [facility];  Service: General;  Laterality: N/A;   CHOLECYSTECTOMY  02/03/2022   COLONOSCOPY  2017   per patient normal and needs next one in 2027 (Dr. Tobie)   COLONOSCOPY WITH PROPOFOL  N/A 09/04/2022   Procedure: COLONOSCOPY WITH PROPOFOL ;  Surgeon: Shaaron Lamar HERO, MD;  Location: [facility];  Service: Endoscopy;  Laterality: N/A;   ESOPHAGOGASTRODUODENOSCOPY N/A 05/12/2020   Procedure: ESOPHAGOGASTRODUODENOSCOPY (EGD);  Surgeon: Shaaron Lamar HERO, MD;  Location: [facility];  Service: Endoscopy;  Laterality: N/A;  11:15am   Joint  fusion on foot Right    LIVER BIOPSY N/A 03/15/2022   Procedure: LIVER BIOPSY;  Surgeon: Kallie Manuelita BROCKS, MD;  Location: [facility];  Service: General;  Laterality: N/A;   MALONEY DILATION N/A 05/12/2020   Procedure: AGAPITO HODGKIN;  Surgeon: Shaaron Lamar HERO, MD;  Location: [facility];  Service: Endoscopy;  Laterality: N/A;   NASAL SEPTUM SURGERY     POLYPECTOMY  09/04/2022   Procedure: POLYPECTOMY;  Surgeon: Shaaron Lamar HERO, MD;  Location: [facility];  Service: Endoscopy;;   SINOSCOPY      Family History  Problem Relation Age of Onset   Hypertension Father    Hyperlipidemia Father    Diabetes Father    Stroke Father    Depression Father    Anxiety disorder Father    Hearing loss Father    Heart disease Father    Obesity Father    Hypertension Mother    Hyperlipidemia Mother    Diabetes Mellitus II Mother    Congestive Heart Failure Mother    Heart attack Mother    Anxiety disorder Mother    Arthritis Mother    Diabetes Mother    Heart disease Mother    Obesity Mother    Varicose Veins Mother    Hypertension Sister    Diabetes Sister  Hyperlipidemia Sister    Obesity Sister    Hypertension Brother    Autoimmune disease Brother    Vision loss Brother    Diabetes Maternal Grandmother    Arthritis Maternal Grandmother    Obesity Maternal Grandmother    Heart disease Maternal Grandfather    Stroke Maternal Grandfather    Lupus Paternal Grandmother    Depression Paternal Grandmother    Anxiety disorder Paternal Grandmother    Heart disease Paternal Grandmother    Alcohol abuse Paternal Grandfather    Depression Maternal Aunt    Anxiety disorder Maternal Aunt    Cancer Maternal Aunt    Diabetes Paternal Uncle    Colon cancer Neg Hx    Esophageal cancer Neg Hx    Gastric cancer Neg Hx     Social History   Socioeconomic History   Marital status: Married    Spouse name: Not on file   Number of children: 0   Years of education: Not on file   Highest  education level: Bachelor's degree (e.g., BA, AB, BS)  Occupational History   Occupation: research associate/drug development  Tobacco Use   Smoking status: Former    Current packs/day: 0.00    Types: Cigarettes    Quit date: 12/30/1982    Years since quitting: 41.7   Smokeless tobacco: Never  Vaping Use   Vaping status: Never Used  Substance and Sexual Activity   Alcohol use: Not Currently    Alcohol/week: 0.0 standard drinks of alcohol   Drug use: Never   Sexual activity: Yes    Birth control/protection: Post-menopausal  Other Topics Concern   Not on file  Social History Narrative   Married for 3 years.Lives with husband.Bachelors in Building surveyor.   Right handed   Caffeine: 1-2 per day    Social Drivers of Health   Financial Resource Strain: Low Risk  (06/14/2024)   Overall Financial Resource Strain (CARDIA)    Difficulty of Paying Living Expenses: Not hard at all  Food Insecurity: No Food Insecurity (06/14/2024)   Hunger Vital Sign    Worried About Running Out of Food in the Last Year: Never true    Ran Out of Food in the Last Year: Never true  Transportation Needs: No Transportation Needs (06/14/2024)   PRAPARE - Administrator, Civil Service (Medical): No    Lack of Transportation (Non-Medical): No  Physical Activity: Inactive (06/14/2024)   Exercise Vital Sign    Days of Exercise per Week: 0 days    Minutes of Exercise per Session: 0 min  Stress: Stress Concern Present (06/14/2024)   [employer] of Occupational Health - Occupational Stress Questionnaire    Feeling of Stress: To some extent  Social Connections: Moderately Isolated (06/14/2024)   Social Connection and Isolation Panel    Frequency of Communication with Friends and Family: More than three times a week    Frequency of Social Gatherings with Friends and Family: Once a week    Attends Religious Services: Never    Database administrator or Organizations: No    Attends Banker  Meetings: Never    Marital Status: Married  Catering manager Violence: Not At Risk (06/14/2024)   Humiliation, Afraid, Rape, and Kick questionnaire    Fear of Current or Ex-Partner: No    Emotionally Abused: No    Physically Abused: No    Sexually Abused: No    Outpatient Medications Prior to Visit  Medication Sig Dispense Refill  Acetylcysteine (N-ACETYL CYSTEINE) 600 MG CAPS Take 1 capsule by mouth daily.     ALPRAZolam (XANAX) 0.5 MG tablet Take 0.25 mg by mouth daily as needed for anxiety.     ARIPiprazole (ABILIFY) 2 MG tablet Take 2 mg by mouth daily.     azithromycin  (ZITHROMAX ) 250 MG tablet Take 2 tablets on day 1, then 1 tablet daily on days 2 through 5 6 tablet 0   B-12 Methylcobalamin 1000 MCG TBDP Take 1,000 mcg by mouth daily.     blood glucose meter kit and supplies KIT Inject 1 each into the skin daily. Check blood glucose once a day. Diagnosis code: E 11.9. 1 each 0   budesonide  (PULMICORT ) 0.5 MG/2ML nebulizer solution Open/mix contents of 1 vial into 240 ml of sinus rinse bottle and irrigate sinuses twice daily. 60 mL 2   calcium  carbonate (TUMS - DOSED IN MG ELEMENTAL CALCIUM ) 500 MG chewable tablet Chew 1,000 mg by mouth daily as needed for indigestion or heartburn.     chlorthalidone  (HYGROTON ) 25 MG tablet TAKE 1 TABLET BY MOUTH ONCE DAILY 45 tablet 0   Continuous Glucose Receiver (DEXCOM G7 RECEIVER) DEVI 1 each by Does not apply route continuous.     Continuous Glucose Sensor (DEXCOM G7 SENSOR) MISC Use to check glucose continuously, change sensor every 10 days 9 each 3   cyclobenzaprine  (FLEXERIL ) 5 MG tablet Take 1 tablet (5 mg total) by mouth 2 (two) times daily as needed. 30 tablet 1   diphenoxylate -atropine  (LOMOTIL ) 2.5-0.025 MG tablet Take 1 tablet by mouth 4 (four) times daily as needed for diarrhea or loose stools. 12 tablet 0   DULoxetine (CYMBALTA) 60 MG capsule Take 120 mg by mouth every morning. Two capsules (120mg ) daily     gabapentin  (NEURONTIN ) 300  MG capsule Take 1 capsule (300 mg total) by mouth at bedtime. X 1 week then can increase to 600 mg QHS if possible- for FMS 60 capsule 5   glucose blood (ACCU-CHEK GUIDE) test strip 1 each by Other route in the morning, at noon, and at bedtime. Use as instructed 300 each 3   insulin  degludec (TRESIBA  FLEXTOUCH) 200 UNIT/ML FlexTouch Pen Inject 86 Units into the skin 2 (two) times daily. 81 mL 1   Insulin  Degludec FlexTouch 200 UNIT/ML SOPN 86 units qam, 84 units qpm 81 mL 3   insulin  lispro (HUMALOG  KWIKPEN) 100 UNIT/ML KwikPen Inject 1-5 Units into the skin as needed. 15 mL 3   Insulin  Pen Needle (PEN NEEDLES) 32G X 4 MM MISC Check BS 2 x a day 200 each 5   Insulin  Pen Needle (RELION PEN NEEDLES) 31G X 6 MM MISC USE 2 PEN NEEDLEs to check blood glucose twice a day 50 each 5   JARDIANCE  25 MG TABS tablet TAKE 1 TABLET BY MOUTH ONCE DAILY BEFORE BREAKFAST 30 tablet 0   L-Methylfolate 15 MG TABS Take 15 mg by mouth daily at 6 (six) AM.     losartan  (COZAAR ) 25 MG tablet Take 1 tablet (25 mg total) by mouth daily. 90 tablet 3   metoprolol  tartrate (LOPRESSOR ) 25 MG tablet Take 1 tablet by mouth twice daily 180 tablet 2   modafinil (PROVIGIL) 100 MG tablet Take 100 mg by mouth every morning.     Multiple Vitamins-Minerals (MULTIVITAMIN WOMEN 50+) TABS Take 1 tablet by mouth daily.     NONFORMULARY OR COMPOUNDED ITEM Take 4 mg by mouth at bedtime. X 2 weeks, then 8 mg nightly- for nerve  pain and chronic fatigue- sent in 2 week Rx for 4 mg and 6 months supply for 8 mg- sent to [facility] (Patient taking differently: Take 8 mg by mouth at bedtime. Naltrexone 8mg )     nystatin  powder Apply 1 Application topically 2 (two) times daily. 30 g 2   omega-3 acid ethyl esters (LOVAZA ) 1 g capsule Take 2 capsules (2 g total) by mouth 2 (two) times daily. 120 capsule 5   pantoprazole  (PROTONIX ) 40 MG tablet Take 1 tablet by mouth once daily 90 tablet 0   potassium chloride  SA (KLOR-CON  M) 20 MEQ tablet Take  2 tablets by mouth twice daily 360 tablet 0   rosuvastatin  (CRESTOR ) 5 MG tablet Take 1 tablet (5 mg total) by mouth daily. 90 tablet 3   SUMAtriptan  (IMITREX ) 100 MG tablet Take 1 tablet (100 mg total) by mouth every 2 (two) hours as needed for migraine. May repeat in 2 hours if headache persists or recurs. Maximum 2 per day. 10 tablet 2   tirzepatide  (MOUNJARO ) 5 MG/0.5ML Pen Inject 5 mg into the skin once a week. 6 mL 0   topiramate  (TOPAMAX ) 25 MG tablet Take 1 tablet (25 mg total) by mouth at bedtime. 30 tablet 6   metFORMIN  (GLUCOPHAGE -XR) 500 MG 24 hr tablet Take 2 tablets (1,000 mg total) by mouth daily with supper. 180 tablet 1   No facility-administered medications prior to visit.    Allergies  Allergen Reactions   Cephalexin  Itching, Swelling and Other (See Comments)    Review of Systems  Constitutional:  Positive for fatigue. Negative for chills and fever.  HENT:  Positive for congestion and sinus pressure. Negative for ear discharge.   Respiratory:  Positive for cough. Negative for shortness of breath and wheezing.   Cardiovascular:  Negative for chest pain and palpitations.  Gastrointestinal:  Negative for diarrhea and vomiting.  Genitourinary:  Negative for dysuria and hematuria.  Musculoskeletal:  Positive for back pain. Negative for neck pain and neck stiffness.  Skin:  Negative for rash.  Neurological:  Positive for headaches. Negative for dizziness and weakness.  Psychiatric/Behavioral:  Negative for agitation and behavioral problems.        Objective:    Physical Exam Constitutional:      General: She is not in acute distress.    Appearance: She is obese. She is not diaphoretic.  HENT:     Head: Normocephalic and atraumatic.     Nose: Congestion present.     Right Sinus: Frontal sinus tenderness present.     Left Sinus: Frontal sinus tenderness present.     Mouth/Throat:     Mouth: Mucous membranes are moist.     Pharynx: Posterior oropharyngeal erythema  present.  Eyes:     General: No scleral icterus.    Extraocular Movements: Extraocular movements intact.  Cardiovascular:     Rate and Rhythm: Normal rate and regular rhythm.     Heart sounds: Normal heart sounds. No murmur heard. Pulmonary:     Breath sounds: Normal breath sounds. No wheezing or rales.  Musculoskeletal:     Cervical back: Neck supple. No tenderness.  Skin:    General: Skin is dry.     Coloration: Skin is not pale.     Findings: No rash.  Neurological:     General: No focal deficit present.     Mental Status: She is alert and oriented to person, place, and time.  Psychiatric:  Mood and Affect: Affect is flat.        Behavior: Behavior normal.        Thought Content: Thought content normal.     BP 113/74   Pulse 87   Ht 5' 9 (1.753 m)   Wt 261 lb 9.6 oz (118.7 kg)   LMP  (LMP Unknown)   SpO2 94%   BMI 38.63 kg/m  Wt Readings from Last 3 Encounters:  09/02/24 261 lb 9.6 oz (118.7 kg)  08/04/24 266 lb (120.7 kg)  07/20/24 266 lb (120.7 kg)        Assessment & Plan:   Problem List Items Addressed This Visit       Respiratory   Perennial allergic rhinitis   Continue Claritin Has azelastine  nasal spray Recently added Pulmicort  with nasal saline spray, was initially advised by ENT specialist Has been seen by ENT specialist and allergy specialist      Recurrent sinusitis - Primary   Her current symptoms are likely due to acute sinusitis Started empiric azithromycin  considering recent worsening of her symptoms and her chronic medical conditions Continue Claritin and azelastine  nasal spray for allergies Tessalon  PRN for cough Has been seen by ENT specialist and allergy specialist      Relevant Medications   benzonatate  (TESSALON ) 200 MG capsule     Meds ordered this encounter  Medications   benzonatate  (TESSALON ) 200 MG capsule    Sig: Take 1 capsule (200 mg total) by mouth 2 (two) times daily as needed for cough.    Dispense:  20  capsule    Refill:  1     Terence Googe MARLA Blanch, MD

## 2024-09-02 NOTE — Patient Instructions (Signed)
 Please continue taking Azithromycin  as prescribed.  Please use nasal spray for nasal congestion.  Please take Tessalon  as needed for cough.

## 2024-09-03 ENCOUNTER — Other Ambulatory Visit: Payer: Self-pay | Admitting: "Endocrinology

## 2024-09-03 DIAGNOSIS — E1140 Type 2 diabetes mellitus with diabetic neuropathy, unspecified: Secondary | ICD-10-CM

## 2024-09-03 LAB — HEPATIC FUNCTION PANEL
ALT: 31 IU/L (ref 0–32)
AST: 41 IU/L — ABNORMAL HIGH (ref 0–40)
Albumin: 4.2 g/dL (ref 3.8–4.9)
Alkaline Phosphatase: 91 IU/L (ref 44–121)
Bilirubin Total: 0.5 mg/dL (ref 0.0–1.2)
Bilirubin, Direct: 0.18 mg/dL (ref 0.00–0.40)
Total Protein: 6.5 g/dL (ref 6.0–8.5)

## 2024-09-03 NOTE — Assessment & Plan Note (Signed)
 Continue Claritin Has azelastine  nasal spray Recently added Pulmicort  with nasal saline spray, was initially advised by ENT specialist Has been seen by ENT specialist and allergy specialist

## 2024-09-03 NOTE — Assessment & Plan Note (Addendum)
 Her current symptoms are likely due to acute sinusitis Started empiric azithromycin  considering recent worsening of her symptoms and her chronic medical conditions Continue Claritin and azelastine  nasal spray for allergies Tessalon  PRN for cough Has been seen by ENT specialist and allergy specialist

## 2024-09-06 ENCOUNTER — Ambulatory Visit: Payer: Self-pay | Admitting: Internal Medicine

## 2024-09-08 ENCOUNTER — Encounter: Payer: Self-pay | Admitting: Gastroenterology

## 2024-09-08 ENCOUNTER — Ambulatory Visit: Admitting: Gastroenterology

## 2024-09-08 VITALS — BP 103/71 | HR 82 | Temp 98.7°F | Ht 69.5 in | Wt 262.2 lb

## 2024-09-08 DIAGNOSIS — R1011 Right upper quadrant pain: Secondary | ICD-10-CM | POA: Diagnosis not present

## 2024-09-08 DIAGNOSIS — R7989 Other specified abnormal findings of blood chemistry: Secondary | ICD-10-CM

## 2024-09-08 DIAGNOSIS — K5900 Constipation, unspecified: Secondary | ICD-10-CM | POA: Insufficient documentation

## 2024-09-08 DIAGNOSIS — K5903 Drug induced constipation: Secondary | ICD-10-CM

## 2024-09-08 DIAGNOSIS — K219 Gastro-esophageal reflux disease without esophagitis: Secondary | ICD-10-CM | POA: Diagnosis not present

## 2024-09-08 DIAGNOSIS — K760 Fatty (change of) liver, not elsewhere classified: Secondary | ICD-10-CM

## 2024-09-08 DIAGNOSIS — R160 Hepatomegaly, not elsewhere classified: Secondary | ICD-10-CM | POA: Insufficient documentation

## 2024-09-08 MED ORDER — PANTOPRAZOLE SODIUM 40 MG PO TBEC: 40.0000 mg | DELAYED_RELEASE_TABLET | Freq: Every day | ORAL | 3 refills | Status: AC

## 2024-09-08 NOTE — Progress Notes (Signed)
 GI Office Note    Referring Provider: Tobie Suzzane POUR, MD Primary Care Physician:  Tobie Suzzane POUR, MD  Primary Gastroenterologist: Ozell Hollingshead, MD   Chief Complaint   Chief Complaint  Patient presents with   Follow-up    Has intermittent right side abd pain every now and then    History of Present Illness   Sydney Taylor is a 58 y.o. female presenting today for follow up hepatomegaly/hepatic steatosis. Last seen in 04/2023. Prior liver biopsy, cholecystectomy in March 2023 showed moderate steatosis, no pathologic fibrosis, hepatic lobules showed moderate mostly large droplet macrovesicular steatosis without evidence of steatohepatitis.  She also has chronic GERD and adenomatous colon polyps due colonoscopy 2030.   Discussed the use of AI scribe software for clinical note transcription with the patient, who gave verbal consent to proceed.      She experiences occasional twinges of pain in the right flank area for the past few months. The pain is mild, rated 4 to 5 out of 10, and is not constant but occurs more often than not. It lasts for minutes and is described as a 'gripping' sensation. There are no specific triggers or exacerbating factors, and it does not lead to nausea, vomiting, or changes in bowel habits.  In July, she visited the emergency department for severe vomiting and diarrhea, attributed to a stomach bug. A CT scan was performed during that visit, and she was already experiencing the right flank pain at that time.  She has a history of fatty liver, confirmed by a liver biopsy during her gallbladder removal, which showed moderate steatosis but no significant fibrosis.  She is currently taking Mounjaro  (has been on for about 5 months) for diabetes management, which has resulted in constipation, with bowel movements occurring every other day and stools being more compact and harder. She has not required any laxatives or stool softeners yet but occasionally  experiences straining.  She reports occasional heartburn and indigestion, for which she is on pantoprazole  40mg  daily. No melena, brbpr. No N/V. She has been on Mounjaro  for four to five months, which has helped manage her A1c levels. She does not consume alcohol.     Prior Data   Wt Readings from Last 10 Encounters:  09/08/24 262 lb 3.2 oz (118.9 kg)  09/02/24 261 lb 9.6 oz (118.7 kg)  08/04/24 266 lb (120.7 kg)  07/20/24 266 lb (120.7 kg)  07/14/24 260 lb (117.9 kg)  06/30/24 265 lb 14 oz (120.6 kg)  06/16/24 268 lb 6.4 oz (121.7 kg)  06/14/24 263 lb (119.3 kg)  05/05/24 267 lb 3.2 oz (121.2 kg)  01/28/24 271 lb 6.4 oz (123.1 kg)    Labs 09/02/2024: alb 4.2, Tbili 0.5, AP 91, AST 41, ALT 31 Labs 07/2024: A1C 6.7, creatinine 0.94 Labs 05/2024: Hemoglobin 15.4, white blood cell count 9.3, platelets 263, iron  63, TIBC 347, iron  sats 18%, ferritin 62  CT A/P with contrast 06/2024:  IMPRESSION: 1. No acute localizing process in the abdomen or pelvis. 2. Hepatomegaly with fatty infiltration of the liver. 3. Stable splenic hypodensity favored as a cyst or hemangioma. 4. Uterine fibroid. 5. Aortic atherosclerosis.  Colonoscopy 05/2022: -one 5mm polyp removed -segmental biopsies -no active colitis, tubular adenoma -next colonoscopy 7 years   Medications   Current Outpatient Medications  Medication Sig Dispense Refill   ALPRAZolam (XANAX) 0.5 MG tablet Take 0.25 mg by mouth daily as needed for anxiety.     ARIPiprazole (ABILIFY) 5 MG  tablet Take 5 mg by mouth daily.     B-12 Methylcobalamin 1000 MCG TBDP Take 1,000 mcg by mouth daily.     blood glucose meter kit and supplies KIT Inject 1 each into the skin daily. Check blood glucose once a day. Diagnosis code: E 11.9. 1 each 0   budesonide  (PULMICORT ) 0.5 MG/2ML nebulizer solution Open/mix contents of 1 vial into 240 ml of sinus rinse bottle and irrigate sinuses twice daily. 60 mL 2   calcium  carbonate (TUMS - DOSED IN MG ELEMENTAL  CALCIUM ) 500 MG chewable tablet Chew 1,000 mg by mouth daily as needed for indigestion or heartburn.     chlorthalidone  (HYGROTON ) 25 MG tablet TAKE 1 TABLET BY MOUTH ONCE DAILY 45 tablet 0   Continuous Glucose Receiver (DEXCOM G7 RECEIVER) DEVI 1 each by Does not apply route continuous.     Continuous Glucose Sensor (DEXCOM G7 SENSOR) MISC Use to check glucose continuously, change sensor every 10 days 9 each 3   DULoxetine (CYMBALTA) 60 MG capsule Take 120 mg by mouth every morning. Two capsules (120mg ) daily     gabapentin  (NEURONTIN ) 300 MG capsule Take 1 capsule (300 mg total) by mouth at bedtime. X 1 week then can increase to 600 mg QHS if possible- for FMS 60 capsule 5   glucose blood (ACCU-CHEK GUIDE) test strip 1 each by Other route in the morning, at noon, and at bedtime. Use as instructed 300 each 3   insulin  degludec (TRESIBA  FLEXTOUCH) 200 UNIT/ML FlexTouch Pen Inject 86 Units into the skin 2 (two) times daily. 81 mL 1   insulin  lispro (HUMALOG  KWIKPEN) 100 UNIT/ML KwikPen Inject 1-5 Units into the skin as needed. 15 mL 3   Insulin  Pen Needle (RELION PEN NEEDLES) 31G X 6 MM MISC USE 2 PEN NEEDLEs to check blood glucose twice a day 50 each 5   JARDIANCE  25 MG TABS tablet TAKE 1 TABLET BY MOUTH ONCE DAILY BEFORE BREAKFAST 30 tablet 0   L-Methylfolate 15 MG TABS Take 15 mg by mouth daily at 6 (six) AM.     losartan  (COZAAR ) 25 MG tablet Take 1 tablet (25 mg total) by mouth daily. 90 tablet 3   metFORMIN  (GLUCOPHAGE -XR) 500 MG 24 hr tablet TAKE 2 TABLETS BY MOUTH ONCE DAILY WITH SUPPER 180 tablet 0   metoprolol  tartrate (LOPRESSOR ) 25 MG tablet Take 1 tablet by mouth twice daily 180 tablet 2   modafinil (PROVIGIL) 100 MG tablet Take 100 mg by mouth every morning.     Multiple Vitamins-Minerals (MULTIVITAMIN WOMEN 50+) TABS Take 1 tablet by mouth daily.     NONFORMULARY OR COMPOUNDED ITEM Take 4 mg by mouth at bedtime. X 2 weeks, then 8 mg nightly- for nerve pain and chronic fatigue- sent in  2 week Rx for 4 mg and 6 months supply for 8 mg- sent to [facility] (Patient taking differently: Take 8 mg by mouth at bedtime. Naltrexone 8mg )     omega-3 acid ethyl esters (LOVAZA ) 1 g capsule Take 2 capsules (2 g total) by mouth 2 (two) times daily. 120 capsule 5   pantoprazole  (PROTONIX ) 40 MG tablet Take 1 tablet by mouth once daily 90 tablet 0   potassium chloride  SA (KLOR-CON  M) 20 MEQ tablet Take 2 tablets by mouth twice daily 360 tablet 0   rosuvastatin  (CRESTOR ) 5 MG tablet Take 1 tablet (5 mg total) by mouth daily. 90 tablet 3   SUMAtriptan  (IMITREX ) 100 MG tablet Take 1 tablet (100  mg total) by mouth every 2 (two) hours as needed for migraine. May repeat in 2 hours if headache persists or recurs. Maximum 2 per day. 10 tablet 2   tirzepatide  (MOUNJARO ) 5 MG/0.5ML Pen Inject 5 mg into the skin once a week. 6 mL 0   topiramate  (TOPAMAX ) 25 MG tablet Take 1 tablet (25 mg total) by mouth at bedtime. 30 tablet 6   No current facility-administered medications for this visit.    Allergies   Allergies as of 09/08/2024 - Review Complete 09/08/2024  Allergen Reaction Noted   Cephalexin  Itching, Swelling, and Other (See Comments) 05/02/2020    Review of Systems   General: Negative for anorexia, weight loss, fever, chills, fatigue, weakness. ENT: Negative for hoarseness, difficulty swallowing , nasal congestion. CV: Negative for chest pain, angina, palpitations, dyspnea on exertion, peripheral edema.  Respiratory: Negative for dyspnea at rest, dyspnea on exertion, cough, sputum, wheezing.  GI: See history of present illness. GU:  Negative for dysuria, hematuria, urinary incontinence, urinary frequency, nocturnal urination.  Endo: Negative for unusual weight change.     Physical Exam   BP 103/71 (BP Location: Right Arm, Patient Position: Sitting, Cuff Size: Large)   Pulse 82   Temp 98.7 F (37.1 C) (Oral)   Ht 5' 9.5 (1.765 m)   Wt 262 lb 3.2 oz (118.9 kg)   LMP  (LMP  Unknown)   SpO2 94%   BMI 38.17 kg/m    General: Well-nourished, well-developed in no acute distress.  Eyes: No icterus. Mouth: Oropharyngeal mucosa moist and pink   Lungs: Clear to auscultation bilaterally.  Heart: Regular rate and rhythm, no murmurs rubs or gallops.  Abdomen: Bowel sounds are normal,  nondistended, no hepatosplenomegaly or masses,  no abdominal bruits or hernia , no rebound or guarding. Mild tenderness noted in the right upper quadrant, 4-5 fingerbreadth below the anterior right lower ribcage. Fullness noted, suspected palpation of liver but due to body habitus exam is limited.  Rectal: not performed Extremities: No lower extremity edema. No clubbing or deformities. Neuro: Alert and oriented x 4   Skin: Warm and dry, no jaundice.   Psych: Alert and cooperative, normal mood and affect.  Labs   See above  Imaging Studies   No results found.  Assessment/Plan:      MASLD (Metabolic dysfunction-associated steatotic liver disease) with hepatomegaly Morbidly obese female with diabetes, hepatomegaly, biopsy proven steatosis without steatohepatitis and no significant fibrosis (08/2022). Liver enzymes mostly normal, AST one point elevated.  - Order baseline ELF score   - LFTs, CBC for NIT assessment. - Encourage lifestyle modifications including diet (low carb/low fat), exercise, and limited alcohol intake to rare use, advisee consumption of two cups of coffee daily if tolerated.  Constipation secondary to Mounjaro  (tirzepatide ) Constipation since starting Mounjaro , with infrequent bowel movements and harder stools. Discussed potential interventions including Miralax, stool softeners, or fiber supplements. - Recommend daily use of Miralax as needed. - increase dietary fiber - plenty of water intake   RUQ discomfort: Somewhat nonspecific right upper quadrant discomfort, lasting minutes at a time, unrelated to movement, position, meals, BMs. Possible MSK or due to  hepatomegaly.  -reviewed CT to verify sit of pain was scanned -continue to monitor for now, if worsening, would consider upper endoscopy to rule out UGI source  Chronic GERD: Clinically well controlled on pantoprazole  40mg  daily.      Sonny RAMAN. Ezzard, MHS, PA-C [facility]

## 2024-09-08 NOTE — Patient Instructions (Addendum)
 I have refilled your pantoprazole  today. Consider starting miralax one capful once to twice daily for mild constipation. This is basically a stool softener and does not work right away. Consider taking daily until your stools are moving better, then you can take it if you go more than 24 hours without a stool, therefore keeping it in your system as you need it.  Please complete labs at your convenience. I will review your CT later today and let you know if area of concern was scanned. For fatty liver, continue to work at controlled your diabetes. Eat low fat/low sweets. Ten pound weight loss in the next four months can be helpful with fatty liver management. Try to fit some exercise into your regimen each day.

## 2024-09-24 ENCOUNTER — Other Ambulatory Visit: Payer: Self-pay | Admitting: "Endocrinology

## 2024-09-24 DIAGNOSIS — E1165 Type 2 diabetes mellitus with hyperglycemia: Secondary | ICD-10-CM

## 2024-09-28 ENCOUNTER — Other Ambulatory Visit: Payer: Self-pay | Admitting: Physical Medicine and Rehabilitation

## 2024-09-30 ENCOUNTER — Encounter: Payer: Self-pay | Admitting: Physical Medicine and Rehabilitation

## 2024-10-12 NOTE — Progress Notes (Unsigned)
 Cardiology Office Note   Date:  10/13/2024   ID:  Sydney Taylor, DOB [birth year], MRN [id number]  PCP:  Tobie Suzzane MARLA, MD  Cardiologist:   Vina Gull, MD   Pt presents for follow up of palpitations      History of Present Illness: Sydney Taylor is a 58 y.o. female with a history palpitations, OSA, dizziness, DM, bipolar disorder, obesity  Pt has a hx of CP    Pt previously followed in [place] by cardiology   Was placed on b blocker for resting tachycardia    2023  Seen in ER for CP   Atypical  Improved with GI cocktail 2023  I saw the pt for first time    Ca score CT done   Ca score was 0   Mild atheromatous changes of aorta   Hepatic steatosis present   Diet (as asked in 2024) Breakfast:   Nuts, keto granola cereal and almond milk  1/2 banana    Drink   Coffee with sugar free creamer Lunch:   Varies  Veggie sausages on salads Diner   Variable  Drinks   Water, occasional diet soda  PT seen Aug 2024  Since seen she denies CP  Breathing is good   She feeds her chickens but otherwise is not too active  She denies palpitations   Does have fibromyalgia      Wonders why her HR was elevated in the past at rest   No dizziness  Current Meds  Medication Sig   ALPRAZolam (XANAX) 0.5 MG tablet Take 0.25 mg by mouth daily as needed for anxiety.   ARIPiprazole (ABILIFY) 5 MG tablet Take 5 mg by mouth daily.   B-12 Methylcobalamin 1000 MCG TBDP Take 1,000 mcg by mouth daily.   blood glucose meter kit and supplies KIT Inject 1 each into the skin daily. Check blood glucose once a day. Diagnosis code: E 11.9.   budesonide  (PULMICORT ) 0.5 MG/2ML nebulizer solution Open/mix contents of 1 vial into 240 ml of sinus rinse bottle and irrigate sinuses twice daily.   calcium  carbonate (TUMS - DOSED IN MG ELEMENTAL CALCIUM ) 500 MG chewable tablet Chew 1,000 mg by mouth daily as needed for indigestion or heartburn.   chlorthalidone  (HYGROTON ) 25 MG tablet Take 12.5 mg by mouth daily.    Cholecalciferol  (VITAMIN D3) 25 MCG (1000 UT) CAPS Take by mouth daily.   Continuous Glucose Receiver (DEXCOM G7 RECEIVER) DEVI 1 each by Does not apply route continuous.   Continuous Glucose Sensor (DEXCOM G7 SENSOR) MISC Use to check glucose continuously, change sensor every 10 days   DULoxetine (CYMBALTA) 60 MG capsule Take 120 mg by mouth every morning. Two capsules (120mg ) daily   gabapentin  (NEURONTIN ) 300 MG capsule Take 2 capsules (600 mg total) by mouth at bedtime.   glucose blood (ACCU-CHEK GUIDE) test strip 1 each by Other route in the morning, at noon, and at bedtime. Use as instructed   insulin  degludec (TRESIBA  FLEXTOUCH) 200 UNIT/ML FlexTouch Pen Inject 86 Units into the skin 2 (two) times daily.   insulin  lispro (HUMALOG  KWIKPEN) 100 UNIT/ML KwikPen Inject 1-5 Units into the skin as needed.   Insulin  Pen Needle (RELION PEN NEEDLES) 31G X 6 MM MISC USE 2 PEN NEEDLEs to check blood glucose twice a day   JARDIANCE  25 MG TABS tablet TAKE 1 TABLET BY MOUTH ONCE DAILY BEFORE BREAKFAST   L-Methylfolate 15 MG TABS Take 15 mg by mouth daily at 6 (six)  AM.   losartan  (COZAAR ) 25 MG tablet Take 1 tablet (25 mg total) by mouth daily.   MAGNESIUM  GLYCINATE PO Take by mouth daily.   metFORMIN  (GLUCOPHAGE -XR) 500 MG 24 hr tablet TAKE 2 TABLETS BY MOUTH ONCE DAILY WITH SUPPER   metoprolol  tartrate (LOPRESSOR ) 25 MG tablet Take 1 tablet by mouth twice daily   modafinil (PROVIGIL) 100 MG tablet Take 100 mg by mouth every morning.   Multiple Vitamins-Minerals (MULTIVITAMIN WOMEN 50+) TABS Take 1 tablet by mouth daily.   NONFORMULARY OR COMPOUNDED ITEM Take 4 mg by mouth at bedtime. X 2 weeks, then 8 mg nightly- for nerve pain and chronic fatigue- sent in 2 week Rx for 4 mg and 6 months supply for 8 mg- sent to [facility]   omega-3 acid ethyl esters (LOVAZA ) 1 g capsule Take 2 capsules (2 g total) by mouth 2 (two) times daily.   pantoprazole  (PROTONIX ) 40 MG tablet Take 1 tablet (40 mg  total) by mouth daily before breakfast.   potassium chloride  SA (KLOR-CON  M) 20 MEQ tablet Take 2 tablets by mouth twice daily (Patient taking differently: Take 20 mEq by mouth daily.)   rosuvastatin  (CRESTOR ) 5 MG tablet Take 1 tablet (5 mg total) by mouth daily.   SUMAtriptan  (IMITREX ) 100 MG tablet Take 1 tablet (100 mg total) by mouth every 2 (two) hours as needed for migraine. May repeat in 2 hours if headache persists or recurs. Maximum 2 per day.   tirzepatide  (MOUNJARO ) 5 MG/0.5ML Pen Inject 5 mg into the skin once a week.   topiramate  (TOPAMAX ) 25 MG tablet Take 1 tablet (25 mg total) by mouth at bedtime.   [DISCONTINUED] chlorthalidone  (HYGROTON ) 25 MG tablet TAKE 1 TABLET BY MOUTH ONCE DAILY   h   Allergies:   Cephalexin    Past Medical History:  Diagnosis Date   Anemia    Anxiety    Arrhythmia    Arthritis    Bipolar depression (HCC) 12/28/2019   Cataract    Chronic fatigue    Chronic headaches    Depression    Diabetes mellitus without complication (HCC)    Fibromyalgia    GERD (gastroesophageal reflux disease)    Heart murmur    High cholesterol    HLD (hyperlipidemia)    Hypertension    Insect bite    Migraine    Prediabetes    Sleep apnea    Thyroid  disease    Urinary incontinence    Vasculitis     Past Surgical History:  Procedure Laterality Date   BIOPSY  09/04/2022   Procedure: BIOPSY;  Surgeon: Shaaron Lamar HERO, MD;  [facility];  Service: Endoscopy;;   CHOLECYSTECTOMY N/A 03/15/2022   Procedure: LAPAROSCOPIC CHOLECYSTECTOMY WITH INTRAOPERATIVE CHOLANGIOGRAM;  Surgeon: Kallie Manuelita BROCKS, MD;  Location: [facility];  Service: General;  Laterality: N/A;   CHOLECYSTECTOMY  02/03/2022   COLONOSCOPY  2017   per patient normal and needs next one in 2027 (Dr. Tobie)   COLONOSCOPY WITH PROPOFOL  N/A 09/04/2022   Procedure: COLONOSCOPY WITH PROPOFOL ;  Surgeon: Shaaron Lamar HERO, MD;  [facility]: [facility];  Service: Endoscopy;  Laterality: N/A;    ESOPHAGOGASTRODUODENOSCOPY N/A 05/12/2020   Procedure: ESOPHAGOGASTRODUODENOSCOPY (EGD);  Surgeon: Shaaron Lamar HERO, MD;  [facility];  Service: Endoscopy;  Laterality: N/A;  11:15am   Joint fusion on foot Right    LIVER BIOPSY N/A 03/15/2022   Procedure: LIVER BIOPSY;  Surgeon: Kallie Manuelita BROCKS, MD;  Location:  [facility];  Service: General;  Laterality: N/A;   MALONEY DILATION N/A 05/12/2020   Procedure: AGAPITO DILATION;  Surgeon: Shaaron Lamar HERO, MD;  [facility]: [facility];  Service: Endoscopy;  Laterality: N/A;   NASAL SEPTUM SURGERY     POLYPECTOMY  09/04/2022   Procedure: POLYPECTOMY;  Surgeon: Shaaron Lamar HERO, MD;  Location[facility];  Service: Endoscopy;;   SINOSCOPY       Social History:  The patient  reports that she quit smoking about 41 years ago. Her smoking use included cigarettes. She has never used smokeless tobacco. She reports that she does not currently use alcohol. She reports that she does not use drugs.   Family History:  The patient's family history includes Alcohol abuse in her paternal grandfather; Anxiety disorder in her father, maternal aunt, mother, and paternal grandmother; Arthritis in her maternal grandmother and mother; Autoimmune disease in her brother; Cancer in her maternal aunt; Congestive Heart Failure in her mother; Depression in her father, maternal aunt, and paternal grandmother; Diabetes in her father, maternal grandmother, mother, paternal uncle, and sister; Diabetes Mellitus II in her mother; Hearing loss in her father; Heart attack in her mother; Heart disease in her father, maternal grandfather, mother, and paternal grandmother; Hyperlipidemia in her father, mother, and sister; Hypertension in her brother, father, mother, and sister; Lupus in her paternal grandmother; Obesity in her father, maternal grandmother, mother, and sister; Stroke in her father and maternal grandfather; Varicose Veins in her mother; Vision loss in her brother.     ROS:  Please see the history of present illness. All other systems are reviewed and  Negative to the above problem except as noted.    PHYSICAL EXAM: VS:  BP 104/76 (BP Location: Left Arm, Cuff Size: Large)   Pulse 83   Ht 5' 9.5 (1.765 m)   Wt 264 lb (119.7 kg)   LMP  (LMP Unknown)   SpO2 97%   BMI 38.43 kg/m   GEN: Morbidly obese 58 yo  in no acute distress  HEENT: normal  Neck: no JVD, carotid bruits Cardiac: RRR; no murmur  NO LE  edema  Respiratory:  clear to auscultation  GI: No obvious masses Nontender   Ext  No LE edema    EKG:  EKG shows SR  Qwaves V1, V2    Lipid Panel    Component Value Date/Time   CHOL 136 03/25/2024 1643   TRIG 203 (H) 03/25/2024 1643   HDL 35 (L) 03/25/2024 1643   CHOLHDL 3.9 03/25/2024 1643   CHOLHDL 4 04/29/2023 1435   VLDL 59.8 (H) 04/29/2023 1435   LDLCALC 67 03/25/2024 1643   LDLCALC 133 (H) 01/21/2020 1009   LDLDIRECT 66.0 04/29/2023 1435      Wt Readings from Last 3 Encounters:  10/13/24 264 lb (119.7 kg)  09/08/24 262 lb 3.2 oz (118.9 kg)  09/02/24 261 lb 9.6 oz (118.7 kg)      ASSESSMENT AND PLAN:  1 Hx of CP   Pt deniess    2 Hx tachycardia   The pt wore a monitor in 2023 (was on metoprolol  at the time   Average HR was 92)     I would taper b blocker to off and follow up with monitor in 2 months  Stay hydrated     3  HTN  BP is well controlled   She denies dizziness   4   OSA   Pt had been on CPAP  5   DM   Last A1C 6.7  Reviewed diet   Increase activity     6  Morbid obesity Pt on mounjaro    Reviewed diet   Increase activity Current medicines are reviewed at length with the patient today.  The patient does not have concerns regarding medicines.  Signed, Vina Gull, MD  10/13/2024 1:10 PM    [facility] [address] [place], [place], [place]  [address] Phone: [phone]; Fax: [phone]

## 2024-10-13 ENCOUNTER — Encounter: Payer: Self-pay | Admitting: Internal Medicine

## 2024-10-13 ENCOUNTER — Ambulatory Visit: Attending: Internal Medicine | Admitting: Internal Medicine

## 2024-10-13 VITALS — BP 104/76 | HR 83 | Ht 69.5 in | Wt 264.0 lb

## 2024-10-13 DIAGNOSIS — I10 Essential (primary) hypertension: Secondary | ICD-10-CM

## 2024-10-13 NOTE — Patient Instructions (Addendum)
 Medication Instructions:   Reduce metoprolol  to 1/2 dose twice a day for 3 weeks, then, reduce to 1/2 dose once a day for 3 weeks and the stop.  Labwork: None today  Testing/Procedures: ZIO XT- Long Term Monitor Instructions  to be scheduled in 2 months  Your physician has requested you wear your ZIO patch monitor___3____days.   This is a single patch monitor.  [employer] supplies one patch monitor per enrollment.  Additional stickers are not available.   Please do not apply patch if you will be having a Nuclear Stress Test, Echocardiogram, Cardiac CT, MRI, or Chest Xray during the time frame you would be wearing the monitor. The patch cannot be worn during these tests.  You cannot remove and re-apply the ZIO XT patch monitor.   Your ZIO patch monitor will be sent USPS Priority mail from [facility] directly to your home address. The monitor may also be mailed to a PO BOX if home delivery is not available.   It may take 3-5 days to receive your monitor after you have been enrolled.   Once you have received you monitor, please review enclosed instructions.  Your monitor has already been registered assigning a specific monitor serial # to you.   Applying the monitor   Shave hair from upper left chest.   Hold abrader disc by orange tab.  Rub abrader in 40 strokes over left upper chest as indicated in your monitor instructions.   Clean area with 4 enclosed alcohol pads .  Use all pads to assure are is cleaned thoroughly.  Let dry.   Apply patch as indicated in monitor instructions.  Patch will be place under collarbone on left side of chest with arrow pointing upward.   Rub patch adhesive wings for 2 minutes.Remove white label marked 1.  Remove white label marked 2.  Rub patch adhesive wings for 2 additional minutes.   While looking in a mirror, press and release button in center of patch.  A small green light will flash 3-4 times .  This will be your only indicator the monitor  has been turned on.     Do not shower for the first 24 hours.  You may shower after the first 24 hours.   Press button if you feel a symptom. You will hear a small click.  Record Date, Time and Symptom in the Patient Log Book.   When you are ready to remove patch, follow instructions on last 2 pages of Patient Log Book.  Stick patch monitor onto last page of Patient Log Book.   Place Patient Log Book in [place] box.  Use locking tab on box and tape box closed securely.  The [employer] and [employer] has [employer] on it.  Please place in mailbox as soon as possible.  Your physician should have your test results approximately 7 days after the monitor has been mailed back to [place].   Call [facility] Customer Care at [phone] if you have questions regarding your ZIO XT patch monitor.  Call them immediately if you see an orange light blinking on your monitor.   If your monitor falls off in less than 4 days contact our Monitor department at [phone].  If your monitor becomes loose or falls off after 4 days call [employer] at [phone] for suggestions on securing your monitor.    Follow-Up: 12-18 months with Dr.Ross  Any Other Special Instructions Will Be Listed Below (If Applicable).  If you need a refill on  your cardiac medications before your next appointment, please call your pharmacy.

## 2024-10-14 ENCOUNTER — Ambulatory Visit (HOSPITAL_BASED_OUTPATIENT_CLINIC_OR_DEPARTMENT_OTHER): Admitting: Pulmonary Disease

## 2024-10-29 ENCOUNTER — Encounter: Admitting: Physical Medicine and Rehabilitation

## 2024-10-31 ENCOUNTER — Other Ambulatory Visit: Payer: Self-pay | Admitting: "Endocrinology

## 2024-10-31 DIAGNOSIS — Z794 Long term (current) use of insulin: Secondary | ICD-10-CM

## 2024-11-02 ENCOUNTER — Other Ambulatory Visit: Payer: Self-pay | Admitting: "Endocrinology

## 2024-11-02 DIAGNOSIS — E1140 Type 2 diabetes mellitus with diabetic neuropathy, unspecified: Secondary | ICD-10-CM

## 2024-11-02 NOTE — Telephone Encounter (Signed)
 Requested Prescriptions   Pending Prescriptions Disp Refills   JARDIANCE  25 MG TABS tablet [Pharmacy Med Name: Jardiance  25 MG Oral Tablet] 30 tablet 0    Sig: TAKE 1 TABLET BY MOUTH ONCE DAILY BEFORE BREAKFAST

## 2024-11-02 NOTE — Telephone Encounter (Signed)
 Requested Prescriptions   Pending Prescriptions Disp Refills   metFORMIN  (GLUCOPHAGE -XR) 500 MG 24 hr tablet [Pharmacy Med Name: metFORMIN  HCl ER 500 MG Oral Tablet Extended Release 24 Hour] 180 tablet 0    Sig: TAKE 2 TABLETS BY MOUTH ONCE DAILY WITH SUPPER

## 2024-11-04 ENCOUNTER — Ambulatory Visit (HOSPITAL_BASED_OUTPATIENT_CLINIC_OR_DEPARTMENT_OTHER): Admitting: Pulmonary Disease

## 2024-11-12 ENCOUNTER — Encounter: Payer: Self-pay | Admitting: "Endocrinology

## 2024-11-12 ENCOUNTER — Telehealth: Admitting: "Endocrinology

## 2024-11-12 VITALS — Ht 69.5 in | Wt 262.0 lb

## 2024-11-12 DIAGNOSIS — E1165 Type 2 diabetes mellitus with hyperglycemia: Secondary | ICD-10-CM

## 2024-11-12 DIAGNOSIS — E782 Mixed hyperlipidemia: Secondary | ICD-10-CM

## 2024-11-12 DIAGNOSIS — Z7984 Long term (current) use of oral hypoglycemic drugs: Secondary | ICD-10-CM | POA: Diagnosis not present

## 2024-11-12 DIAGNOSIS — Z794 Long term (current) use of insulin: Secondary | ICD-10-CM | POA: Diagnosis not present

## 2024-11-12 DIAGNOSIS — Z7985 Long-term (current) use of injectable non-insulin antidiabetic drugs: Secondary | ICD-10-CM

## 2024-11-12 MED ORDER — TIRZEPATIDE 7.5 MG/0.5ML ~~LOC~~ SOAJ: 7.5000 mg | SUBCUTANEOUS | 1 refills | Status: DC

## 2024-11-12 NOTE — Progress Notes (Signed)
 The patient reports they are currently: Sydney Taylor. I spent 9 minutes on the video with the patient on the date of service. I spent an additional 5 minutes on pre- and post-visit activities on the date of service.   The patient was physically located in [place]  or a state in which I am permitted to provide care. The patient and/or parent/guardian understood that s/he may incur co-pays and cost sharing, and agreed to the telemedicine visit. The visit was reasonable and appropriate under the circumstances given the patient's presentation at the time.  The patient and/or parent/guardian has been advised of the potential risks and limitations of this mode of treatment (including, but not limited to, the absence of in-person examination) and has agreed to be treated using telemedicine. The patient's/patient's family's questions regarding telemedicine have been answered.   The patient and/or parent/guardian has also been advised to contact their provider's office for worsening conditions, and seek emergency medical treatment and/or call 911 if the patient deems either necessary.    Outpatient Endocrinology Note Obadiah Birmingham, MD  11/12/24   Sydney Taylor 19-Dec-1966 [id number]  Referring Provider: Tobie Suzzane MARLA, MD Primary Care Provider: Tobie Suzzane MARLA, MD Reason for consultation: Subjective   Assessment & Plan  There are no diagnoses linked to this encounter.  Diabetes Type II complicated by neuropathy,  Lab Results  Component Value Date   GFR 57.44 (L) 08/11/2023   Hba1c goal less than 7, current Hba1c is  Lab Results  Component Value Date   HGBA1C 6.7 (H) 08/04/2024   Will recommend the following: Jardiance  25 mg daily, metformin  ER 1 g at dinner Tresiba  84 units qam, 82 units qpm     Mounjaro  7.5 mg once a week, patient wants to try again - could help her binge eating  Disucced S/E, no current contraindication  Humalog  correction scale: Use in addition to your meal  time/short acting insulin  based on blood sugars as follows:  201 - 250: 2 units 251 - 300: 3 units 301 - 350: 4 units 351 - 400: 5 units  No known contraindications/side effects to any of above medications Glucagon  discussed and prescribed with refills on 11/12/24  Instructed to take potasium 20 mq at bedtime  Patient understands and agreed Has prescription at home  -Last LD and Tg are as follows: Lab Results  Component Value Date   LDLCALC 67 03/25/2024    Lab Results  Component Value Date   TRIG 203 (H) 03/25/2024   -on rosuvastatin  5 mg every day starting 08/14/23 -Follow low fat diet and exercise   -Blood pressure goal <140/90 - Microalbumin/creatinine goal is < 30 -Last MA/Cr is as follows: Lab Results  Component Value Date   MICROALBUR 1.1 12/18/2020   -on ACE/ARB losartan  25 mg qd -diet changes including salt restriction -limit eating outside -counseled BP targets per standards of diabetes care -uncontrolled blood pressure can lead to retinopathy, nephropathy and cardiovascular and atherosclerotic heart disease  Reviewed and counseled on: -A1C target -Blood sugar targets -Complications of uncontrolled diabetes  -Checking blood sugar before meals and bedtime and bring log next visit -All medications with mechanism of action and side effects -Hypoglycemia management: rule of 15's, Glucagon  Emergency Kit and medical alert ID -low-carb low-fat plate-method diet -At least 20 minutes of physical activity per day -Annual dilated retinal eye exam and foot exam -compliance and follow up needs -follow up as scheduled or earlier if problem gets worse  Call if blood sugar is less than  70 or consistently above 250    Take a 15 gm snack of carbohydrate at bedtime before you go to sleep if your blood sugar is less than 100.    If you are going to fast after midnight for a test or procedure, ask your physician for instructions on how to reduce/decrease your insulin   dose.    Call if blood sugar is less than 70 or consistently above 250  -Treating a low sugar by rule of 15  (15 gms of sugar every 15 min until sugar is more than 70) If you feel your sugar is low, test your sugar to be sure If your sugar is low (less than 70), then take 15 grams of a fast acting Carbohydrate (3-4 glucose tablets or glucose gel or 4 ounces of juice or regular soda) Recheck your sugar 15 min after treating low to make sure it is more than 70 If sugar is still less than 70, treat again with 15 grams of carbohydrate          Don't drive the hour of hypoglycemia  If unconscious/unable to eat or drink by mouth, use glucagon  injection or nasal spray baqsimi  and call 911. Can repeat again in 15 min if still unconscious.  Return in about 1 month (around 12/12/2024) for tele-visit.   I have reviewed current medications, nurse's notes, allergies, vital signs, past medical and surgical history, family medical history, and social history for this encounter. Counseled patient on symptoms, examination findings, lab findings, imaging results, treatment decisions and monitoring and prognosis. The patient understood the recommendations and agrees with the treatment plan. All questions regarding treatment plan were fully answered.  Obadiah Birmingham, MD  11/12/24   History of Present Illness Sydney Taylor is a 58 y.o. year old female who presents for follow up of Type II diabetes mellitus.  Sydney Taylor was first diagnosed in 2021.   Diabetes education +  Home diabetes regimen: Jardiance  25 mg daily, metformin  ER 1 g at dinner Tresiba  86 units qam, 84 units qpm     Mounjaro  7.5 mg once a week- episodic GI issues weeks after 7.5 mg dose start, unclear if related to it or not     Side effects from medications: Nausea and vomiting with Trulicity   Previous history: She had mild hyperglycemia in 2020 but had significant increase in glucose of 484 in 3/21  Non-insulin  hypoglycemic  drugs previously used: Jardiance  since 9/21, Bydureon , Trulicity , metformin , glipizide  Insulin  was started in 2022  A1c range in the last few years is: 6.4-12.7  COMPLICATIONS -  MI/Stroke -  retinopathy +  neuropathy +  nephropathy  BLOOD SUGAR DATA CGM interpretation: At today's visit, we reviewed her CGM downloads. The full report is scanned in the media. Reviewing the CGM trends, BG are well controlled most of the day with some highs randomly spread across the day.  Physical Exam  Ht 5' 9.5 (1.765 m)   Wt 262 lb (118.8 kg)   LMP  (LMP Unknown)   BMI 38.14 kg/m    Constitutional: well developed, well nourished Head: normocephalic, atraumatic Eyes: sclera anicteric, no redness Neck: supple Lungs: normal respiratory effort Neurology: alert and oriented Skin: dry, no appreciable rashes Musculoskeletal: no appreciable defects Psychiatric: normal mood and affect Diabetic Foot Exam - Simple   No data filed      Current Medications Patient's Medications  New Prescriptions   TIRZEPATIDE  (MOUNJARO ) 7.5 MG/0.5ML PEN    Inject 7.5 mg into the skin  once a week.  Previous Medications   ALPRAZOLAM (XANAX) 0.5 MG TABLET    Take 0.25 mg by mouth daily as needed for anxiety.   ARIPIPRAZOLE (ABILIFY) 5 MG TABLET    Take 5 mg by mouth daily.   B-12 METHYLCOBALAMIN 1000 MCG TBDP    Take 1,000 mcg by mouth daily.   BLOOD GLUCOSE METER KIT AND SUPPLIES KIT    Inject 1 each into the skin daily. Check blood glucose once a day. Diagnosis code: E 11.9.   BUDESONIDE  (PULMICORT ) 0.5 MG/2ML NEBULIZER SOLUTION    Open/mix contents of 1 vial into 240 ml of sinus rinse bottle and irrigate sinuses twice daily.   CALCIUM  CARBONATE (TUMS - DOSED IN MG ELEMENTAL CALCIUM ) 500 MG CHEWABLE TABLET    Chew 1,000 mg by mouth daily as needed for indigestion or heartburn.   CHLORTHALIDONE  (HYGROTON ) 25 MG TABLET    Take 12.5 mg by mouth daily.   CHOLECALCIFEROL  (VITAMIN D3) 25 MCG (1000 UT) CAPS    Take by  mouth daily.   CONTINUOUS GLUCOSE RECEIVER (DEXCOM G7 RECEIVER) DEVI    1 each by Does not apply route continuous.   CONTINUOUS GLUCOSE SENSOR (DEXCOM G7 SENSOR) MISC    Use to check glucose continuously, change sensor every 10 days   DULOXETINE (CYMBALTA) 60 MG CAPSULE    Take 120 mg by mouth every morning. Two capsules (120mg ) daily   GABAPENTIN  (NEURONTIN ) 300 MG CAPSULE    Take 2 capsules (600 mg total) by mouth at bedtime.   GLUCOSE BLOOD (ACCU-CHEK GUIDE) TEST STRIP    1 each by Other route in the morning, at noon, and at bedtime. Use as instructed   INSULIN  DEGLUDEC (TRESIBA  FLEXTOUCH) 200 UNIT/ML FLEXTOUCH PEN    Inject 86 Units into the skin 2 (two) times daily.   INSULIN  LISPRO (HUMALOG  KWIKPEN) 100 UNIT/ML KWIKPEN    Inject 1-5 Units into the skin as needed.   INSULIN  PEN NEEDLE (RELION PEN NEEDLES) 31G X 6 MM MISC    USE 2 PEN NEEDLEs to check blood glucose twice a day   JARDIANCE  25 MG TABS TABLET    TAKE 1 TABLET BY MOUTH ONCE DAILY BEFORE BREAKFAST   L-METHYLFOLATE 15 MG TABS    Take 15 mg by mouth daily at 6 (six) AM.   LOSARTAN  (COZAAR ) 25 MG TABLET    Take 1 tablet (25 mg total) by mouth daily.   MAGNESIUM  GLYCINATE PO    Take by mouth daily.   METFORMIN  (GLUCOPHAGE -XR) 500 MG 24 HR TABLET    TAKE 2 TABLETS BY MOUTH ONCE DAILY WITH SUPPER   METOPROLOL  TARTRATE (LOPRESSOR ) 25 MG TABLET    Take 1 tablet by mouth twice daily   MODAFINIL (PROVIGIL) 100 MG TABLET    Take 100 mg by mouth every morning.   MULTIPLE VITAMINS-MINERALS (MULTIVITAMIN WOMEN 50+) TABS    Take 1 tablet by mouth daily.   NONFORMULARY OR COMPOUNDED ITEM    Take 4 mg by mouth at bedtime. X 2 weeks, then 8 mg nightly- for nerve pain and chronic fatigue- sent in 2 week Rx for 4 mg and 6 months supply for 8 mg- sent to [facility]   OMEGA-3 ACID ETHYL ESTERS (LOVAZA ) 1 G CAPSULE    Take 2 capsules (2 g total) by mouth 2 (two) times daily.   PANTOPRAZOLE  (PROTONIX ) 40 MG TABLET    Take 1 tablet (40 mg total)  by mouth daily before breakfast.   POTASSIUM  CHLORIDE SA (KLOR-CON  M) 20 MEQ TABLET    Take 2 tablets by mouth twice daily   ROSUVASTATIN  (CRESTOR ) 5 MG TABLET    Take 1 tablet (5 mg total) by mouth daily.   SUMATRIPTAN  (IMITREX ) 100 MG TABLET    Take 1 tablet (100 mg total) by mouth every 2 (two) hours as needed for migraine. May repeat in 2 hours if headache persists or recurs. Maximum 2 per day.   TOPIRAMATE  (TOPAMAX ) 25 MG TABLET    Take 1 tablet (25 mg total) by mouth at bedtime.  Modified Medications   No medications on file  Discontinued Medications   TIRZEPATIDE  (MOUNJARO ) 5 MG/0.5ML PEN    Inject 5 mg into the skin once a week.    Allergies Allergies  Allergen Reactions   Cephalexin  Itching, Swelling and Other (See Comments)    Past Medical History Past Medical History:  Diagnosis Date   Anemia    Anxiety    Arrhythmia    Arthritis    Bipolar depression (HCC) 12/28/2019   Cataract    Chronic fatigue    Chronic headaches    Depression    Diabetes mellitus without complication (HCC)    Fibromyalgia    GERD (gastroesophageal reflux disease)    Heart murmur    High cholesterol    HLD (hyperlipidemia)    Hypertension    Insect bite    Migraine    Prediabetes    Sleep apnea    Thyroid  disease    Urinary incontinence    Vasculitis     Past Surgical History Past Surgical History:  Procedure Laterality Date   BIOPSY  09/04/2022   Procedure: BIOPSY;  Surgeon: Shaaron Lamar HERO, MD;  [facility]: [facility];  Service: Endoscopy;;   CHOLECYSTECTOMY N/A 03/15/2022   Procedure: LAPAROSCOPIC CHOLECYSTECTOMY WITH INTRAOPERATIVE CHOLANGIOGRAM;  Surgeon: Kallie Manuelita BROCKS, MD;  Location: [facility];  Service: General;  Laterality: N/A;   CHOLECYSTECTOMY  02/03/2022   COLONOSCOPY  2017   per patient normal and needs next one in 2027 (Dr. Tobie)   COLONOSCOPY WITH PROPOFOL  N/A 09/04/2022   Procedure: COLONOSCOPY WITH PROPOFOL ;  Surgeon: Shaaron Lamar HERO, MD;  [facility]: [facility];  Service: Endoscopy;  Laterality: N/A;   ESOPHAGOGASTRODUODENOSCOPY N/A 05/12/2020   Procedure: ESOPHAGOGASTRODUODENOSCOPY (EGD);  Surgeon: Shaaron Lamar HERO, MD;  Location: [facility];  Service: Endoscopy;  Laterality: N/A;  11:15am   Joint fusion on foot Right    LIVER BIOPSY N/A 03/15/2022   Procedure: LIVER BIOPSY;  Surgeon: Kallie Manuelita BROCKS, MD;  Location: [facility];  Service: General;  Laterality: N/A;   MALONEY DILATION N/A 05/12/2020   Procedure: AGAPITO HODGKIN;  Surgeon: Shaaron Lamar HERO, MD;  Location: [facility];  Service: Endoscopy;  Laterality: N/A;   NASAL SEPTUM SURGERY     POLYPECTOMY  09/04/2022   Procedure: POLYPECTOMY;  Surgeon: Shaaron Lamar HERO, MD;  Location: [facility];  Service: Endoscopy;;   SINOSCOPY      Family History family history includes Alcohol abuse in her paternal grandfather; Anxiety disorder in her father, maternal aunt, mother, and paternal grandmother; Arthritis in her maternal grandmother and mother; Autoimmune disease in her brother; Cancer in her maternal aunt; Congestive Heart Failure in her mother; Depression in her father, maternal aunt, and paternal grandmother; Diabetes in her father, maternal grandmother, mother, paternal uncle, and sister; Diabetes Mellitus II in her mother; Hearing loss in her father; Heart attack in her mother; Heart disease in her  father, maternal grandfather, mother, and paternal grandmother; Hyperlipidemia in her father, mother, and sister; Hypertension in her brother, father, mother, and sister; Lupus in her paternal grandmother; Obesity in her father, maternal grandmother, mother, and sister; Stroke in her father and maternal grandfather; Varicose Veins in her mother; Vision loss in her brother.  Social History Social History   Socioeconomic History   Marital status: Married    Spouse name: Not on file   Number of children: 0   Years of education: Not on file   Highest education level: Bachelor's degree  (e.g., BA, AB, BS)  Occupational History   Occupation: research associate/drug development  Tobacco Use   Smoking status: Former    Current packs/day: 0.00    Types: Cigarettes    Quit date: 12/30/1982    Years since quitting: 41.8   Smokeless tobacco: Never  Vaping Use   Vaping status: Never Used  Substance and Sexual Activity   Alcohol use: Not Currently    Alcohol/week: 0.0 standard drinks of alcohol   Drug use: Never   Sexual activity: Yes    Birth control/protection: Post-menopausal  Other Topics Concern   Not on file  Social History Narrative   Married for 3 years.Lives with husband.Bachelors in Building Surveyor.   Right handed   Caffeine: 1-2 per day    Social Drivers of Health   Financial Resource Strain: Low Risk  (06/14/2024)   Overall Financial Resource Strain (CARDIA)    Difficulty of Paying Living Expenses: Not hard at all  Food Insecurity: No Food Insecurity (06/14/2024)   Hunger Vital Sign    Worried About Running Out of Food in the Last Year: Never true    Ran Out of Food in the Last Year: Never true  Transportation Needs: No Transportation Needs (06/14/2024)   PRAPARE - Administrator, Civil Service (Medical): No    Lack of Transportation (Non-Medical): No  Physical Activity: Inactive (06/14/2024)   Exercise Vital Sign    Days of Exercise per Week: 0 days    Minutes of Exercise per Session: 0 min  Stress: Stress Concern Present (06/14/2024)   [employer] of Occupational Health - Occupational Stress Questionnaire    Feeling of Stress: To some extent  Social Connections: Moderately Isolated (06/14/2024)   Social Connection and Isolation Panel    Frequency of Communication with Friends and Family: More than three times a week    Frequency of Social Gatherings with Friends and Family: Once a week    Attends Religious Services: Never    Database Administrator or Organizations: No    Attends Banker Meetings: Never    Marital Status: Married   Catering Manager Violence: Not At Risk (06/14/2024)   Humiliation, Afraid, Rape, and Kick questionnaire    Fear of Current or Ex-Partner: No    Emotionally Abused: No    Physically Abused: No    Sexually Abused: No    Lab Results  Component Value Date   HGBA1C 6.7 (H) 08/04/2024   HGBA1C 6.8 (H) 03/25/2024   HGBA1C 7.2 (H) 08/11/2023   Lab Results  Component Value Date   CHOL 136 03/25/2024   Lab Results  Component Value Date   HDL 35 (L) 03/25/2024   Lab Results  Component Value Date   LDLCALC 67 03/25/2024   Lab Results  Component Value Date   TRIG 203 (H) 03/25/2024   Lab Results  Component Value Date   CHOLHDL 3.9 03/25/2024   Lab  Results  Component Value Date   CREATININE 0.94 08/04/2024   Lab Results  Component Value Date   GFR 57.44 (L) 08/11/2023   Lab Results  Component Value Date   MICROALBUR 1.1 12/18/2020      Component Value Date/Time   NA 141 08/04/2024 1101   K 3.9 08/04/2024 1101   CL 102 08/04/2024 1101   CO2 23 08/04/2024 1101   GLUCOSE 138 (H) 08/04/2024 1101   GLUCOSE 95 07/14/2024 2230   BUN 9 08/04/2024 1101   CREATININE 0.94 08/04/2024 1101   CREATININE 1.03 03/29/2022 1324   CALCIUM  9.4 08/04/2024 1101   PROT 6.5 09/02/2024 1404   ALBUMIN 4.2 09/02/2024 1404   AST 41 (H) 09/02/2024 1404   ALT 31 09/02/2024 1404   ALKPHOS 91 09/02/2024 1404   BILITOT 0.5 09/02/2024 1404   GFRNONAA >60 07/14/2024 2230   GFRNONAA 77 03/19/2021 1457   GFRAA 89 03/19/2021 1457      Latest Ref Rng & Units 08/04/2024   11:01 AM 07/14/2024   10:30 PM 03/25/2024    4:43 PM  BMP  Glucose 70 - 99 mg/dL 861  95  870   BUN 6 - 24 mg/dL 9  20  14    Creatinine 0.57 - 1.00 mg/dL 9.05  8.96  9.07   BUN/Creat Ratio 9 - 23 10   15    Sodium 134 - 144 mmol/L 141  138  141   Potassium 3.5 - 5.2 mmol/L 3.9  2.8  3.6   Chloride 96 - 106 mmol/L 102  98  101   CO2 20 - 29 mmol/L 23  27  22    Calcium  8.7 - 10.2 mg/dL 9.4  8.4  9.5        Component Value  Date/Time   WBC 10.3 07/14/2024 2230   RBC 5.13 (H) 07/14/2024 2230   HGB 16.3 (H) 07/14/2024 2230   HGB 14.1 11/24/2023 1538   HCT 48.2 (H) 07/14/2024 2230   HCT 45.3 11/24/2023 1538   PLT 279 07/14/2024 2230   PLT 389 11/24/2023 1538   MCV 94.0 07/14/2024 2230   MCV 84 11/24/2023 1538   MCH 31.8 07/14/2024 2230   MCHC 33.8 07/14/2024 2230   RDW 13.6 07/14/2024 2230   RDW 15.8 (H) 11/24/2023 1538   LYMPHSABS 2.6 07/14/2024 2230   LYMPHSABS 2.3 11/24/2023 1538   MONOABS 0.8 07/14/2024 2230   EOSABS 0.7 (H) 07/14/2024 2230   EOSABS 0.4 11/24/2023 1538   BASOSABS 0.0 07/14/2024 2230   BASOSABS 0.1 11/24/2023 1538     Parts of this note may have been dictated using voice recognition software. There may be variances in spelling and vocabulary which are unintentional. Not all errors are proofread. Please notify the dino if any discrepancies are noted or if the meaning of any statement is not clear.

## 2024-11-12 NOTE — Patient Instructions (Signed)

## 2024-11-15 ENCOUNTER — Encounter: Payer: Self-pay | Admitting: Internal Medicine

## 2024-11-15 ENCOUNTER — Ambulatory Visit (HOSPITAL_BASED_OUTPATIENT_CLINIC_OR_DEPARTMENT_OTHER): Admitting: Pulmonary Disease

## 2024-11-15 ENCOUNTER — Other Ambulatory Visit: Payer: Self-pay | Admitting: Internal Medicine

## 2024-11-15 DIAGNOSIS — J329 Chronic sinusitis, unspecified: Secondary | ICD-10-CM

## 2024-11-15 MED ORDER — AZITHROMYCIN 250 MG PO TABS: ORAL_TABLET | ORAL | 0 refills | Status: AC

## 2024-11-22 ENCOUNTER — Telehealth: Admitting: Internal Medicine

## 2024-11-22 ENCOUNTER — Encounter: Payer: Self-pay | Admitting: Internal Medicine

## 2024-11-22 ENCOUNTER — Encounter: Payer: Self-pay | Admitting: Physical Medicine and Rehabilitation

## 2024-11-22 VITALS — BP 134/88 | HR 86 | Ht 69.5 in | Wt 258.0 lb

## 2024-11-22 DIAGNOSIS — J329 Chronic sinusitis, unspecified: Secondary | ICD-10-CM

## 2024-11-22 MED ORDER — UNABLE TO FIND: 0 refills | Status: AC

## 2024-11-22 MED ORDER — MOXIFLOXACIN HCL 400 MG PO TABS: 400.0000 mg | ORAL_TABLET | Freq: Every day | ORAL | 0 refills | Status: AC

## 2024-11-22 NOTE — Progress Notes (Signed)
 Virtual Visit via Video Note   Because of Sydney Taylor's co-morbid illnesses, she is at least at moderate risk for complications without adequate follow up.  This format is felt to be most appropriate for this patient at this time.  All issues noted in this document were discussed and addressed.  A limited physical exam was performed with this format.      Evaluation Performed:  Follow-up visit  Date:  11/22/2024   ID:  Sydney Taylor, DOB 05/14/1966, MRN [id number]  Patient Location: Home Provider Location: Office/Clinic  Participants: Patient Location of Patient: Home Location of Provider: Telehealth Consent was obtain for visit to be over via telehealth. I verified that I am speaking with the correct person using two identifiers.  PCP:  Tobie Suzzane POUR, MD   Chief Complaint: Nasal congestion and cough  History of Present Illness:    Sydney Taylor is a 58 y.o. female who has a video visit for complaint of nasal congestion, sinus pressure related headache and cough with yellowish expectoration for the last 2 weeks.  She has also noticed blood with nasal secretions at times.  She recently completed azithromycin  for acute sinusitis, which has improved sinus related pain, but still complains of rhinorrhea and cough.  She has been rinsing sinuses with saline and budesonide  with mild relief.  She requests a prescription of mupirocin tablet, which she was prescribed by Dr. Karis in the past for use in sinus rinse.  The patient does not have symptoms concerning for COVID-19 infection (fever, chills, cough, or new shortness of breath).   Past Medical, Surgical, Social History, Allergies, and Medications have been Reviewed.  Past Medical History:  Diagnosis Date   Anemia    Anxiety    Arrhythmia    Arthritis    Bipolar depression (HCC) 12/28/2019   Cataract    Chronic fatigue    Chronic headaches    Depression    Diabetes mellitus without complication (HCC)     Fibromyalgia    GERD (gastroesophageal reflux disease)    Heart murmur    High cholesterol    HLD (hyperlipidemia)    Hypertension    Insect bite    Migraine    Prediabetes    Sleep apnea    Thyroid  disease    Urinary incontinence    Vasculitis    Past Surgical History:  Procedure Laterality Date   BIOPSY  09/04/2022   Procedure: BIOPSY;  Surgeon: Shaaron Lamar HERO, MD;  [facility];  Service: Endoscopy;;   CHOLECYSTECTOMY N/A 03/15/2022   Procedure: LAPAROSCOPIC CHOLECYSTECTOMY WITH INTRAOPERATIVE CHOLANGIOGRAM;  Surgeon: Kallie Manuelita BROCKS, MD;  [facility]: [facility];  Service: General;  Laterality: N/A;   CHOLECYSTECTOMY  02/03/2022   COLONOSCOPY  2017   per patient normal and needs next one in 2027 (Dr. Tobie)   COLONOSCOPY WITH PROPOFOL  N/A 09/04/2022   Procedure: COLONOSCOPY WITH PROPOFOL ;  Surgeon: Shaaron Lamar HERO, MD;  [facility];  Service: Endoscopy;  Laterality: N/A;   ESOPHAGOGASTRODUODENOSCOPY N/A 05/12/2020   Procedure: ESOPHAGOGASTRODUODENOSCOPY (EGD);  Surgeon: Shaaron Lamar HERO, MD;  [facility];  Service: Endoscopy;  Laterality: N/A;  11:15am   Joint fusion on foot Right    LIVER BIOPSY N/A 03/15/2022   Procedure: LIVER BIOPSY;  Surgeon: Kallie Manuelita BROCKS, MD;  [facility]: [facility];  Service: General;  Laterality: N/A;   MALONEY DILATION N/A 05/12/2020   Procedure: AGAPITO DILATION;  Surgeon: Shaaron Lamar HERO, MD;  [facility];  Service: Endoscopy;  Laterality: N/A;   NASAL SEPTUM SURGERY     POLYPECTOMY  09/04/2022   Procedure: POLYPECTOMY;  Surgeon: Shaaron Lamar HERO, MD;  [facility];  Service: Endoscopy;;   SINOSCOPY       Current Meds  Medication Sig   ALPRAZolam (XANAX) 0.5 MG tablet Take 0.25 mg by mouth daily as needed for anxiety.   ARIPiprazole (ABILIFY) 5 MG tablet Take 5 mg by mouth daily.   B-12 Methylcobalamin 1000 MCG TBDP Take 1,000 mcg by mouth daily.   blood glucose meter kit and supplies KIT  Inject 1 each into the skin daily. Check blood glucose once a day. Diagnosis code: E 11.9.   budesonide  (PULMICORT ) 0.5 MG/2ML nebulizer solution Open/mix contents of 1 vial into 240 ml of sinus rinse bottle and irrigate sinuses twice daily.   calcium  carbonate (TUMS - DOSED IN MG ELEMENTAL CALCIUM ) 500 MG chewable tablet Chew 1,000 mg by mouth daily as needed for indigestion or heartburn.   chlorthalidone  (HYGROTON ) 25 MG tablet Take 12.5 mg by mouth daily.   Cholecalciferol  (VITAMIN D3) 25 MCG (1000 UT) CAPS Take by mouth daily.   Continuous Glucose Receiver (DEXCOM G7 RECEIVER) DEVI 1 each by Does not apply route continuous.   Continuous Glucose Sensor (DEXCOM G7 SENSOR) MISC Use to check glucose continuously, change sensor every 10 days   DULoxetine (CYMBALTA) 60 MG capsule Take 120 mg by mouth every morning. Two capsules (120mg ) daily   gabapentin  (NEURONTIN ) 300 MG capsule Take 2 capsules (600 mg total) by mouth at bedtime.   glucose blood (ACCU-CHEK GUIDE) test strip 1 each by Other route in the morning, at noon, and at bedtime. Use as instructed   insulin  degludec (TRESIBA  FLEXTOUCH) 200 UNIT/ML FlexTouch Pen Inject 86 Units into the skin 2 (two) times daily.   insulin  lispro (HUMALOG  KWIKPEN) 100 UNIT/ML KwikPen Inject 1-5 Units into the skin as needed.   Insulin  Pen Needle (RELION PEN NEEDLES) 31G X 6 MM MISC USE 2 PEN NEEDLEs to check blood glucose twice a day   JARDIANCE  25 MG TABS tablet TAKE 1 TABLET BY MOUTH ONCE DAILY BEFORE BREAKFAST   L-Methylfolate 15 MG TABS Take 15 mg by mouth daily at 6 (six) AM.   losartan  (COZAAR ) 25 MG tablet Take 1 tablet (25 mg total) by mouth daily.   MAGNESIUM  GLYCINATE PO Take by mouth daily.   metFORMIN  (GLUCOPHAGE -XR) 500 MG 24 hr tablet TAKE 2 TABLETS BY MOUTH ONCE DAILY WITH SUPPER   metoprolol  tartrate (LOPRESSOR ) 25 MG tablet Take 1 tablet by mouth twice daily   modafinil (PROVIGIL) 100 MG tablet Take 100 mg by mouth every morning.    moxifloxacin  (AVELOX ) 400 MG tablet Take 1 tablet (400 mg total) by mouth daily.   Multiple Vitamins-Minerals (MULTIVITAMIN WOMEN 50+) TABS Take 1 tablet by mouth daily.   NONFORMULARY OR COMPOUNDED ITEM Take 4 mg by mouth at bedtime. X 2 weeks, then 8 mg nightly- for nerve pain and chronic fatigue- sent in 2 week Rx for 4 mg and 6 months supply for 8 mg- sent to [facility]   omega-3 acid ethyl esters (LOVAZA ) 1 g capsule Take 2 capsules (2 g total) by mouth 2 (two) times daily.   pantoprazole  (PROTONIX ) 40 MG tablet Take 1 tablet (40 mg total) by mouth daily before breakfast.   potassium chloride  SA (KLOR-CON  M) 20 MEQ tablet Take 2 tablets by mouth twice daily (Patient taking differently:  Take 20 mEq by mouth daily.)   rosuvastatin  (CRESTOR ) 5 MG tablet Take 1 tablet (5 mg total) by mouth daily.   SUMAtriptan  (IMITREX ) 100 MG tablet Take 1 tablet (100 mg total) by mouth every 2 (two) hours as needed for migraine. May repeat in 2 hours if headache persists or recurs. Maximum 2 per day.   tirzepatide  (MOUNJARO ) 7.5 MG/0.5ML Pen Inject 7.5 mg into the skin once a week.   topiramate  (TOPAMAX ) 25 MG tablet Take 1 tablet (25 mg total) by mouth at bedtime.   UNABLE TO FIND Med Name: Mupirocin 20 mg caps. Place contents of 1 capsule into 240 ml of sinus rinse bottle and irrigate sinuses twice daily.     Allergies:   Augmentin  [amoxicillin -pot clavulanate] and Cephalexin    ROS:   Please see the history of present illness. All other systems reviewed and are negative.   Labs/Other Tests and Data Reviewed:    Recent Labs: 03/25/2024: TSH 1.850 07/14/2024: Hemoglobin 16.3; Platelets 279 08/04/2024: BUN 9; Creatinine, Ser 0.94; Magnesium  2.1; Potassium 3.9; Sodium 141 09/02/2024: ALT 31   Recent Lipid Panel Lab Results  Component Value Date/Time   CHOL 136 03/25/2024 04:43 PM   TRIG 203 (H) 03/25/2024 04:43 PM   HDL 35 (L) 03/25/2024 04:43 PM   CHOLHDL 3.9 03/25/2024 04:43 PM   CHOLHDL 4  04/29/2023 02:35 PM   LDLCALC 67 03/25/2024 04:43 PM   LDLCALC 133 (H) 01/21/2020 10:09 AM   LDLDIRECT 66.0 04/29/2023 02:35 PM    Wt Readings from Last 3 Encounters:  11/22/24 258 lb (117 kg)  11/12/24 262 lb (118.8 kg)  10/13/24 264 lb (119.7 kg)     Objective:    Vital Signs:  BP (!) 135/93   Pulse 86   Ht 5' 9.5 (1.765 m)   Wt 258 lb (117 kg)   LMP  (LMP Unknown)   SpO2 92%   BMI 37.55 kg/m    VITAL SIGNS:  reviewed GEN:  no acute distress EYES:  sclerae anicteric, EOMI - Extraocular Movements Intact RESPIRATORY:  normal respiratory effort, symmetric expansion NEURO:  alert and oriented x 3, no obvious focal deficit PSYCH:  normal affect  ASSESSMENT & PLAN:    Assessment & Plan Recurrent sinusitis Her current symptoms are likely due to acute sinusitis Started empiric moxifloxacin  considering recent worsening of her symptoms and her chronic medical conditions, recently completed azithromycin  Continue Claritin and azelastine  nasal spray for allergies Prescribed mupirocin tablet, to be used in saline rinse as advised by ENT specialist in the past - prescription sent to [facility] Has been seen by ENT specialist and allergy specialist - referred to Dr. Karis as per patient request    I discussed the assessment and treatment plan with the patient. The patient was provided an opportunity to ask questions, and all were answered. The patient agreed with the plan and demonstrated an understanding of the instructions.   The patient was advised to call back or seek an in-person evaluation if the symptoms worsen or if the condition fails to improve as anticipated.  The above assessment and management plan was discussed with the patient. The patient verbalized understanding of and has agreed to the management plan.   Medication Adjustments/Labs and Tests Ordered: Current medicines are reviewed at length with the patient today.  Concerns regarding  medicines are outlined above.   Tests Ordered: Orders Placed This Encounter  Procedures   Ambulatory referral to ENT    Medication Changes: Meds  ordered this encounter  Medications   moxifloxacin  (AVELOX ) 400 MG tablet    Sig: Take 1 tablet (400 mg total) by mouth daily.    Dispense:  7 tablet    Refill:  0   UNABLE TO FIND    Sig: Med Name: Mupirocin 20 mg caps. Place contents of 1 capsule into 240 ml of sinus rinse bottle and irrigate sinuses twice daily.    Dispense:  60 capsule    Refill:  0     Note: This dictation was prepared with Dragon dictation along with smaller phrase technology. Similar sounding words can be transcribed inadequately or may not be corrected upon review. Any transcriptional errors that result from this process are unintentional.      Disposition:  Follow up  Signed, Suzzane MARLA Blanch, MD  11/22/2024 11:49 AM     [facility]

## 2024-11-22 NOTE — Assessment & Plan Note (Addendum)
 Her current symptoms are likely due to acute sinusitis Started empiric moxifloxacin  considering recent worsening of her symptoms and her chronic medical conditions, recently completed azithromycin  Continue Claritin and azelastine  nasal spray for allergies Prescribed mupirocin tablet, to be used in saline rinse as advised by ENT specialist in the past - prescription sent to [facility] Has been seen by ENT specialist and allergy specialist - referred to Dr. Karis as per patient request

## 2024-11-22 NOTE — Patient Instructions (Signed)
 Please start using mupirocin in sinus rinse as discussed.  Please start taking moxifloxacin  after 4 days if you have persistent symptoms.

## 2024-11-23 ENCOUNTER — Telehealth: Payer: Self-pay

## 2024-11-23 NOTE — Telephone Encounter (Signed)
 Request for Naltrexone HCL 8 mg  Cap

## 2024-11-24 MED ORDER — NONFORMULARY OR COMPOUNDED ITEM: 8.0000 mg | Freq: Every day | Status: AC

## 2024-11-24 NOTE — Telephone Encounter (Signed)
 Sent in pt's low dose naltrexone for 8 mg at bedtime- # 90 3 refills for pt-  Seeing 12/1- haven't seen in ~ 10 months.   But will see 12/1

## 2024-11-26 ENCOUNTER — Encounter (INDEPENDENT_AMBULATORY_CARE_PROVIDER_SITE_OTHER): Payer: Self-pay

## 2024-11-29 ENCOUNTER — Encounter: Payer: Self-pay | Admitting: Physical Medicine and Rehabilitation

## 2024-11-29 ENCOUNTER — Encounter: Attending: Physical Medicine and Rehabilitation | Admitting: Physical Medicine and Rehabilitation

## 2024-11-29 VITALS — BP 122/82 | HR 89 | Ht 69.5 in | Wt 262.0 lb

## 2024-11-29 DIAGNOSIS — L659 Nonscarring hair loss, unspecified: Secondary | ICD-10-CM | POA: Diagnosis present

## 2024-11-29 DIAGNOSIS — M797 Fibromyalgia: Secondary | ICD-10-CM | POA: Diagnosis present

## 2024-11-29 DIAGNOSIS — M7918 Myalgia, other site: Secondary | ICD-10-CM | POA: Diagnosis present

## 2024-11-29 NOTE — Progress Notes (Signed)
 Subjective:    Patient ID: Sydney Taylor, female    DOB: Nov 13, 1966, 58 y.o.   MRN: [id number]  HPI  Pt is a 58 yr old female with hx of fibromyalgia- since 1985, myofascial pain and chronic low back pain who's being followed by Dr Onesimo, ortho for low back- also has B/L knee DJD- BMI 39.89- currently; also OSA- uses CPA - is compliant - Bmi down to 38.14  Low dose naltrexone really makes a difference  Missed a dose once and could tell was much worse pain wise until took next dose.  Is ~ 50% effective.   Got new refill- of 8 mg Naltrexone.   Ran out yesterday so going to pick up today.   Has not tried any CBD products.   A couple of iron  infusions- since was very low.    Has hair shedding- a lot of hair shedding- notices it esp after showers and then brushes hair, a whole lot comes out.   Doing poorly in moving her body-  Husband and she are signing up for [employer] this week.      Pain Inventory Average Pain 5 Pain Right Now 5 My pain is constant, aching, and electrical   In the last 24 hours, has pain interfered with the following? General activity 6 Relation with others 6 Enjoyment of life 7 What TIME of day is your pain at its worst? daytime Sleep (in general) Good  Pain is worse with: standing and some activites Pain improves with: rest and injections Relief from Meds: fair  Family History  Problem Relation Age of Onset   Hypertension Father    Hyperlipidemia Father    Diabetes Father    Stroke Father    Depression Father    Anxiety disorder Father    Hearing loss Father    Heart disease Father    Obesity Father    Hypertension Mother    Hyperlipidemia Mother    Diabetes Mellitus II Mother    Congestive Heart Failure Mother    Heart attack Mother    Anxiety disorder Mother    Arthritis Mother    Diabetes Mother    Heart disease Mother    Obesity Mother    Varicose Veins Mother    Hypertension Sister    Diabetes Sister     Hyperlipidemia Sister    Obesity Sister    Hypertension Brother    Autoimmune disease Brother    Vision loss Brother    Diabetes Maternal Grandmother    Arthritis Maternal Grandmother    Obesity Maternal Grandmother    Heart disease Maternal Grandfather    Stroke Maternal Grandfather    Lupus Paternal Grandmother    Depression Paternal Grandmother    Anxiety disorder Paternal Grandmother    Heart disease Paternal Grandmother    Alcohol abuse Paternal Grandfather    Depression Maternal Aunt    Anxiety disorder Maternal Aunt    Cancer Maternal Aunt    Diabetes Paternal Uncle    Colon cancer Neg Hx    Esophageal cancer Neg Hx    Gastric cancer Neg Hx    Social History   Socioeconomic History   Marital status: Married    Spouse name: Not on file   Number of children: 0   Years of education: Not on file   Highest education level: Bachelor's degree (e.g., BA, AB, BS)  Occupational History   Occupation: research associate/drug development  Tobacco Use   Smoking status: Former  Current packs/day: 0.00    Types: Cigarettes    Quit date: 12/30/1982    Years since quitting: 41.9   Smokeless tobacco: Never  Vaping Use   Vaping status: Never Used  Substance and Sexual Activity   Alcohol use: Not Currently    Alcohol/week: 0.0 standard drinks of alcohol   Drug use: Never   Sexual activity: Yes    Birth control/protection: Post-menopausal  Other Topics Concern   Not on file  Social History Narrative   Married for 3 years.Lives with husband.Bachelors in Building Surveyor.   Right handed   Caffeine: 1-2 per day    Social Drivers of Health   Financial Resource Strain: Low Risk  (06/14/2024)   Overall Financial Resource Strain (CARDIA)    Difficulty of Paying Living Expenses: Not hard at all  Food Insecurity: No Food Insecurity (06/14/2024)   Hunger Vital Sign    Worried About Running Out of Food in the Last Year: Never true    Ran Out of Food in the Last Year: Never true   Transportation Needs: No Transportation Needs (06/14/2024)   PRAPARE - Administrator, Civil Service (Medical): No    Lack of Transportation (Non-Medical): No  Physical Activity: Inactive (06/14/2024)   Exercise Vital Sign    Days of Exercise per Week: 0 days    Minutes of Exercise per Session: 0 min  Stress: Stress Concern Present (06/14/2024)   [employer] of Occupational Health - Occupational Stress Questionnaire    Feeling of Stress: To some extent  Social Connections: Moderately Isolated (06/14/2024)   Social Connection and Isolation Panel    Frequency of Communication with Friends and Family: More than three times a week    Frequency of Social Gatherings with Friends and Family: Once a week    Attends Religious Services: Never    Database Administrator or Organizations: No    Attends Banker Meetings: Never    Marital Status: Married   Past Surgical History:  Procedure Laterality Date   BIOPSY  09/04/2022   Procedure: BIOPSY;  Surgeon: Shaaron Lamar HERO, MD;  [facility];  Service: Endoscopy;;   CHOLECYSTECTOMY N/A 03/15/2022   Procedure: LAPAROSCOPIC CHOLECYSTECTOMY WITH INTRAOPERATIVE CHOLANGIOGRAM;  Surgeon: Kallie Manuelita BROCKS, MD;  [facility];  Service: General;  Laterality: N/A;   CHOLECYSTECTOMY  02/03/2022   COLONOSCOPY  2017   per patient normal and needs next one in 2027 (Dr. Tobie)   COLONOSCOPY WITH PROPOFOL  N/A 09/04/2022   Procedure: COLONOSCOPY WITH PROPOFOL ;  Surgeon: Shaaron Lamar HERO, MD;  [facility];  Service: Endoscopy;  Laterality: N/A;   ESOPHAGOGASTRODUODENOSCOPY N/A 05/12/2020   Procedure: ESOPHAGOGASTRODUODENOSCOPY (EGD);  Surgeon: Shaaron Lamar HERO, MD;  [facility];  Service: Endoscopy;  Laterality: N/A;  11:15am   Joint fusion on foot Right    LIVER BIOPSY N/A 03/15/2022   Procedure: LIVER BIOPSY;  Surgeon: Kallie Manuelita BROCKS, MD;  [facility];  Service: General;  Laterality: N/A;    MALONEY DILATION N/A 05/12/2020   Procedure: AGAPITO HODGKIN;  Surgeon: Shaaron Lamar HERO, MD;  [facility];  Service: Endoscopy;  Laterality: N/A;   NASAL SEPTUM SURGERY     POLYPECTOMY  09/04/2022   Procedure: POLYPECTOMY;  Surgeon: Shaaron Lamar HERO, MD;  [facility];  Service: Endoscopy;;   SINOSCOPY     Past Surgical History:  Procedure Laterality Date   BIOPSY  09/04/2022   Procedure: BIOPSY;  Surgeon: Shaaron Lamar HERO, MD;  [facility];  Service: Endoscopy;;   CHOLECYSTECTOMY N/A 03/15/2022   Procedure: LAPAROSCOPIC CHOLECYSTECTOMY WITH INTRAOPERATIVE CHOLANGIOGRAM;  Surgeon: Kallie Manuelita BROCKS, MD;  [facility];  Service: General;  Laterality: N/A;   CHOLECYSTECTOMY  02/03/2022   COLONOSCOPY  2017   per patient normal and needs next one in 2027 (Dr. Tobie)   COLONOSCOPY WITH PROPOFOL  N/A 09/04/2022   Procedure: COLONOSCOPY WITH PROPOFOL ;  Surgeon: Shaaron Lamar HERO, MD;  [facility];  Service: Endoscopy;  Laterality: N/A;   ESOPHAGOGASTRODUODENOSCOPY N/A 05/12/2020   Procedure: ESOPHAGOGASTRODUODENOSCOPY (EGD);  Surgeon: Shaaron Lamar HERO, MD;  [facility];  Service: Endoscopy;  Laterality: N/A;  11:15am   Joint fusion on foot Right    LIVER BIOPSY N/A 03/15/2022   Procedure: LIVER BIOPSY;  Surgeon: Kallie Manuelita BROCKS, MD;  [facility];  Service: General;  Laterality: N/A;   MALONEY DILATION N/A 05/12/2020   Procedure: AGAPITO HODGKIN;  Surgeon: Shaaron Lamar HERO, MD;  [facility];  Service: Endoscopy;  Laterality: N/A;   NASAL SEPTUM SURGERY     POLYPECTOMY  09/04/2022   Procedure: POLYPECTOMY;  Surgeon: Shaaron Lamar HERO, MD;  [facility];  Service: Endoscopy;;   SINOSCOPY     Past Medical History:  Diagnosis Date   Anemia    Anxiety    Arrhythmia    Arthritis    Bipolar depression (HCC) 12/28/2019   Cataract    Chronic fatigue    Chronic headaches    Depression    Diabetes  mellitus without complication (HCC)    Fibromyalgia    GERD (gastroesophageal reflux disease)    Heart murmur    High cholesterol    HLD (hyperlipidemia)    Hypertension    Insect bite    Migraine    Prediabetes    Sleep apnea    Thyroid  disease    Urinary incontinence    Vasculitis    LMP  (LMP Unknown)   Opioid Risk Score:   Fall Risk Score:  `1  Depression screen [facility] 2/9     11/22/2024   11:06 AM 09/02/2024    1:39 PM 07/20/2024    8:47 AM 06/30/2024    9:41 AM 06/16/2024   10:09 AM 06/14/2024    1:38 PM 05/28/2024    2:54 PM  Depression screen PHQ 2/9  Decreased Interest 0 0 0 0 0 0 0  Down, Depressed, Hopeless 0 0 0 0 0 0 0  PHQ - 2 Score 0 0 0 0 0 0 0  Altered sleeping 0 0 0 0 0 0 0  Tired, decreased energy 0 0 0 0 0 0 0  Change in appetite 0 0 0 0 0 0 0  Feeling bad or failure about yourself  0 0 0 0 0 0 0  Trouble concentrating 0 0 0 0 0 0 0  Moving slowly or fidgety/restless 0 0 0 0 0 0 0  Suicidal thoughts 0 0 0 0 0 0 0  PHQ-9 Score 0 0  0  0  0  0  0   Difficult doing work/chores Not difficult at all Not difficult at all   Not difficult at all Not difficult at all Not difficult at all     Data saved with a previous flowsheet row definition    Review of Systems  Musculoskeletal:  Positive for back pain and neck pain.  Pain in both shoulders, both knees, both feet, buttocks   All other systems reviewed and are negative.      Objective:   Physical Exam  Awake, alert, appropriate, dressed brightly. NAD Trigger points in scalenes, upper traps, levators and rhomboids B/L- very tight and TTP      Assessment & Plan:   Pt is a 58 yr old female with hx of fibromyalgia- since 1985, myofascial pain and chronic low back pain who's being followed by Dr Onesimo, ortho for low back- also has B/L knee DJD- BMI 39.89- down to 38.14 currently; also OSA- uses [facility] - is compliant.  Will look at copper level- and see if that's related to significant hair loss. Will  send my chart message if normal and call if abnormal.   2.  Last Vit D level 41.8 in 02/2024 and TSH 1.8 in 02/2024- so don't need to check them as well for fatigue, pain symptoms.   3. Pt cannot afford trp Injections due to insurance not paying for them.    4.   Suggest moving more frequently- and less length of time- so suggest 4+ days/week 20 minutes max-  and body will over react to overdoing it. So don't overdo it! Esp initially- chair yoga  5. Con't low dose natrexone- 8 mg at bedtime- sent in 1 year owrht last week.    6.  F/U in 6 months-  for f/u on chronic pain. FMS   I spent a total of  21   minutes on total care today- >50% coordination of care- due to d/w pt about options and how to get moving.

## 2024-11-29 NOTE — Patient Instructions (Addendum)
 Pt is a 58 yr old female with hx of fibromyalgia- since 1985, myofascial pain and chronic low back pain who's being followed by Dr Onesimo, ortho for low back- also has B/L knee DJD- BMI 39.89- down to 38.14 currently; also OSA- uses CPA - is compliant.  Will look at copper level- and see if that's related to significant hair loss. Will sned my chart message if normal and call if abnormal.   2.  Last Vit D level 41.8 in 02/2024 and TSH 1.8 in 02/2024- so don't need to check them as well for fatigue, pain symptoms.   3. Pt cannot afford trp Injections due to insurance not paying for them.    4.   Suggest moving more frequently- and less length of time- so suggest 4+ days/week 20 minutes max-  and body will over react to overdoing it. So don't overdo it! Esp initially-  Chair yoga!!!  5. Con't low dose natrexone- 8 mg at bedtime- sent in 1 year owrht last week.    6.  F/U in 6 months-  for f/u on chronic pain. FMS

## 2024-11-30 ENCOUNTER — Encounter: Payer: Self-pay | Admitting: Physical Medicine and Rehabilitation

## 2024-11-30 ENCOUNTER — Telehealth: Admitting: Adult Health

## 2024-11-30 ENCOUNTER — Other Ambulatory Visit: Payer: Self-pay | Admitting: "Endocrinology

## 2024-11-30 DIAGNOSIS — Z794 Long term (current) use of insulin: Secondary | ICD-10-CM

## 2024-11-30 DIAGNOSIS — G43E19 Chronic migraine with aura, intractable, without status migrainosus: Secondary | ICD-10-CM

## 2024-11-30 DIAGNOSIS — G43009 Migraine without aura, not intractable, without status migrainosus: Secondary | ICD-10-CM | POA: Diagnosis not present

## 2024-11-30 MED ORDER — SUMATRIPTAN SUCCINATE 100 MG PO TABS: ORAL_TABLET | ORAL | 2 refills | Status: AC

## 2024-11-30 NOTE — Telephone Encounter (Signed)
 Requested Prescriptions   Pending Prescriptions Disp Refills   JARDIANCE  25 MG TABS tablet [Pharmacy Med Name: Jardiance  25 MG Oral Tablet] 30 tablet 0    Sig: TAKE 1 TABLET BY MOUTH ONCE DAILY BEFORE BREAKFAST

## 2024-11-30 NOTE — Patient Instructions (Signed)
 Your Plan:  Try sumatriptan  for abortive therapy when you get a migraine     Thank you for coming to see us  at [facility]. I hope we have been able to provide you high quality care today.  You may receive a patient satisfaction survey over the next few weeks. We would appreciate your feedback and comments so that we may continue to improve ourselves and the health of our patients.

## 2024-11-30 NOTE — Progress Notes (Signed)
 PATIENT: Sydney Taylor DOB: 13-Jul-1966  REASON FOR VISIT: follow up HISTORY FROM: patient  Virtual Visit via Video Note  I connected with Sydney Taylor on 11/30/24 at  3:00 PM EST by a video enabled telemedicine application located remotely at [facility] and verified that I am speaking with the correct person using two identifiers who was located at their own home in [place]   I discussed the limitations of evaluation and management by telemedicine and the availability of in person appointments. The patient expressed understanding and agreed to proceed.   PATIENT: Sydney Taylor DOB: 12-18-1966  REASON FOR VISIT: follow up HISTORY FROM: patient  HISTORY OF PRESENT ILLNESS: Today 11/30/24  [name] is a 58 y.o. female with a history of Migraine headaches with aura. Returns today for follow-up.  She states that she never started Topamax .  She states that she worked on her stress level and her migraines improved.  She would like an abortive medication.  It appears that her primary care prescribed this back in February but she never picked it up.  She also reports today that she is having new symptom of shakiness.  States that she discussed it with her psychiatrist but not with her primary care.  Wondering if she should come in for a visit with one of the neurologist.   HISTORY (copied from Dr. Sharion note) She states she wants to discuss migraines today. Started at the age of 16. She will get a wavy line when she gets migraines. In both eyes. They last hours to 24 -36 hours. The visual part is is 30 minutes then starts the headache. Worsening this year. The migraines are pounding/throbbing, she gets light sensitivity, sound sensitivity, a dark cool room helps, they are on both sides of the head more frontal, nausea, no vomiting, more frequent, 15 total headache days a month and 4 days a month of moderate to severe migraines. The other headaches are  more tension type, mild to moderate. No changes in quality, no vision changes, not exertional or positional. Mother with migraine with aura.    Medications tried that can be used in migraine/headache management greater than 3 months include: Lifestyle modification, headache diaries, better sleep hygiene, exercise, management of migraine triggers. Various analgesics/nsaids. Metoprolol , flexeril , cymbalta, lexapro , neurontin , lamictal , losartan , lisinopril, lyrica, sertraline, imitrex , trazodone . Aimovig contraindicated due to constipation.     REVIEW OF SYSTEMS: Out of a complete 14 system review of symptoms, the patient complains only of the following symptoms, and all other reviewed systems are negative.  ALLERGIES: Allergies  Allergen Reactions   Augmentin  [Amoxicillin -Pot Clavulanate] Itching   Cephalexin  Itching, Swelling and Other (See Comments)    HOME MEDICATIONS: Outpatient Medications Prior to Visit  Medication Sig Dispense Refill   ALPRAZolam (XANAX) 0.5 MG tablet Take 0.25 mg by mouth daily as needed for anxiety.     ARIPiprazole (ABILIFY) 5 MG tablet Take 5 mg by mouth daily.     B-12 Methylcobalamin 1000 MCG TBDP Take 1,000 mcg by mouth daily.     blood glucose meter kit and supplies KIT Inject 1 each into the skin daily. Check blood glucose once a day. Diagnosis code: E 11.9. 1 each 0   budesonide  (PULMICORT ) 0.5 MG/2ML nebulizer solution Open/mix contents of 1 vial into 240 ml of sinus rinse bottle and irrigate sinuses twice daily. 60 mL 2   calcium  carbonate (TUMS - DOSED IN MG ELEMENTAL CALCIUM ) 500 MG chewable tablet Chew  1,000 mg by mouth daily as needed for indigestion or heartburn.     chlorthalidone  (HYGROTON ) 25 MG tablet Take 12.5 mg by mouth daily.     Cholecalciferol  (VITAMIN D3) 25 MCG (1000 UT) CAPS Take by mouth daily.     Continuous Glucose Receiver (DEXCOM G7 RECEIVER) DEVI 1 each by Does not apply route continuous.     Continuous Glucose Sensor (DEXCOM G7  SENSOR) MISC Use to check glucose continuously, change sensor every 10 days 9 each 3   DULoxetine (CYMBALTA) 60 MG capsule Take 120 mg by mouth every morning. Two capsules (120mg ) daily     gabapentin  (NEURONTIN ) 300 MG capsule Take 2 capsules (600 mg total) by mouth at bedtime. 60 capsule 0   glucose blood (ACCU-CHEK GUIDE) test strip 1 each by Other route in the morning, at noon, and at bedtime. Use as instructed 300 each 3   insulin  degludec (TRESIBA  FLEXTOUCH) 200 UNIT/ML FlexTouch Pen Inject 86 Units into the skin 2 (two) times daily. 81 mL 1   insulin  lispro (HUMALOG  KWIKPEN) 100 UNIT/ML KwikPen Inject 1-5 Units into the skin as needed. 15 mL 3   Insulin  Pen Needle (RELION PEN NEEDLES) 31G X 6 MM MISC USE 2 PEN NEEDLEs to check blood glucose twice a day 50 each 5   JARDIANCE  25 MG TABS tablet TAKE 1 TABLET BY MOUTH ONCE DAILY BEFORE BREAKFAST 30 tablet 0   L-Methylfolate 15 MG TABS Take 15 mg by mouth daily at 6 (six) AM.     losartan  (COZAAR ) 25 MG tablet Take 1 tablet (25 mg total) by mouth daily. 90 tablet 3   MAGNESIUM  GLYCINATE PO Take by mouth daily.     metFORMIN  (GLUCOPHAGE -XR) 500 MG 24 hr tablet TAKE 2 TABLETS BY MOUTH ONCE DAILY WITH SUPPER 180 tablet 0   metoprolol  tartrate (LOPRESSOR ) 25 MG tablet Take 1 tablet by mouth twice daily 180 tablet 2   modafinil (PROVIGIL) 100 MG tablet Take 100 mg by mouth every morning.     moxifloxacin  (AVELOX ) 400 MG tablet Take 1 tablet (400 mg total) by mouth daily. 7 tablet 0   Multiple Vitamins-Minerals (MULTIVITAMIN WOMEN 50+) TABS Take 1 tablet by mouth daily.     NALTREXONE HCL PO Take by mouth daily.     NONFORMULARY OR COMPOUNDED ITEM Take 8 mg by mouth at bedtime. [facility]     omega-3 acid ethyl esters (LOVAZA ) 1 g capsule Take 2 capsules (2 g total) by mouth 2 (two) times daily. 120 capsule 5   pantoprazole  (PROTONIX ) 40 MG tablet Take 1 tablet (40 mg total) by mouth daily before breakfast. 90 tablet 3   potassium chloride   SA (KLOR-CON  M) 20 MEQ tablet Take 2 tablets by mouth twice daily 360 tablet 0   rosuvastatin  (CRESTOR ) 5 MG tablet Take 1 tablet (5 mg total) by mouth daily. 90 tablet 3   tirzepatide  (MOUNJARO ) 7.5 MG/0.5ML Pen Inject 7.5 mg into the skin once a week. 2 mL 1   topiramate  (TOPAMAX ) 25 MG tablet Take 1 tablet (25 mg total) by mouth at bedtime. 30 tablet 6   UNABLE TO FIND Med Name: Mupirocin 20 mg caps. Place contents of 1 capsule into 240 ml of sinus rinse bottle and irrigate sinuses twice daily. 60 capsule 0   SUMAtriptan  (IMITREX ) 100 MG tablet Take 1 tablet (100 mg total) by mouth every 2 (two) hours as needed for migraine. May repeat in 2 hours if headache persists or recurs. Maximum 2  per day. 10 tablet 2   No facility-administered medications prior to visit.    PAST MEDICAL HISTORY: Past Medical History:  Diagnosis Date   Anemia    Anxiety    Arrhythmia    Arthritis    Bipolar depression (HCC) 12/28/2019   Cataract    Chronic fatigue    Chronic headaches    Depression    Diabetes mellitus without complication (HCC)    Fibromyalgia    GERD (gastroesophageal reflux disease)    Heart murmur    High cholesterol    HLD (hyperlipidemia)    Hypertension    Insect bite    Migraine    Prediabetes    Sleep apnea    Thyroid  disease    Urinary incontinence    Vasculitis     PAST SURGICAL HISTORY: Past Surgical History:  Procedure Laterality Date   BIOPSY  09/04/2022   Procedure: BIOPSY;  Surgeon: Shaaron Lamar HERO, MD;  [facility]: [facility];  Service: Endoscopy;;   CHOLECYSTECTOMY N/A 03/15/2022   Procedure: LAPAROSCOPIC CHOLECYSTECTOMY WITH INTRAOPERATIVE CHOLANGIOGRAM;  Surgeon: Kallie Manuelita BROCKS, MD;  Location: [facility];  Service: General;  Laterality: N/A;   CHOLECYSTECTOMY  02/03/2022   COLONOSCOPY  2017   per patient normal and needs next one in 2027 (Dr. Tobie)   COLONOSCOPY WITH PROPOFOL  N/A 09/04/2022   Procedure: COLONOSCOPY WITH PROPOFOL ;  Surgeon: Shaaron Lamar HERO, MD;  Location: [facility];  Service: Endoscopy;  Laterality: N/A;   ESOPHAGOGASTRODUODENOSCOPY N/A 05/12/2020   Procedure: ESOPHAGOGASTRODUODENOSCOPY (EGD);  Surgeon: Shaaron Lamar HERO, MD;  [facility]: [facility];  Service: Endoscopy;  Laterality: N/A;  11:15am   Joint fusion on foot Right    LIVER BIOPSY N/A 03/15/2022   Procedure: LIVER BIOPSY;  Surgeon: Kallie Manuelita BROCKS, MD;  Location: [facility];  Service: General;  Laterality: N/A;   MALONEY DILATION N/A 05/12/2020   Procedure: AGAPITO HODGKIN;  Surgeon: Shaaron Lamar HERO, MD;  [facility]: [facility];  Service: Endoscopy;  Laterality: N/A;   NASAL SEPTUM SURGERY     POLYPECTOMY  09/04/2022   Procedure: POLYPECTOMY;  Surgeon: Shaaron Lamar HERO, MD;  Location: [facility];  Service: Endoscopy;;   SINOSCOPY      FAMILY HISTORY: Family History  Problem Relation Age of Onset   Hypertension Father    Hyperlipidemia Father    Diabetes Father    Stroke Father    Depression Father    Anxiety disorder Father    Hearing loss Father    Heart disease Father    Obesity Father    Hypertension Mother    Hyperlipidemia Mother    Diabetes Mellitus II Mother    Congestive Heart Failure Mother    Heart attack Mother    Anxiety disorder Mother    Arthritis Mother    Diabetes Mother    Heart disease Mother    Obesity Mother    Varicose Veins Mother    Hypertension Sister    Diabetes Sister    Hyperlipidemia Sister    Obesity Sister    Hypertension Brother    Autoimmune disease Brother    Vision loss Brother    Diabetes Maternal Grandmother    Arthritis Maternal Grandmother    Obesity Maternal Grandmother    Heart disease Maternal Grandfather    Stroke Maternal Grandfather    Lupus Paternal Grandmother    Depression Paternal Grandmother    Anxiety disorder Paternal Grandmother    Heart disease Paternal Grandmother  Alcohol abuse Paternal Grandfather    Depression Maternal Aunt    Anxiety disorder Maternal Aunt    Cancer  Maternal Aunt    Diabetes Paternal Uncle    Colon cancer Neg Hx    Esophageal cancer Neg Hx    Gastric cancer Neg Hx     SOCIAL HISTORY: Social History   Socioeconomic History   Marital status: Married    Spouse name: Not on file   Number of children: 0   Years of education: Not on file   Highest education level: Bachelor's degree (e.g., BA, AB, BS)  Occupational History   Occupation: research associate/drug development  Tobacco Use   Smoking status: Former    Current packs/day: 0.00    Types: Cigarettes    Quit date: 12/30/1982    Years since quitting: 41.9   Smokeless tobacco: Never  Vaping Use   Vaping status: Never Used  Substance and Sexual Activity   Alcohol use: Not Currently    Alcohol/week: 0.0 standard drinks of alcohol   Drug use: Never   Sexual activity: Yes    Birth control/protection: Post-menopausal  Other Topics Concern   Not on file  Social History Narrative   Married for 3 years.Lives with husband.Bachelors in Building Surveyor.   Right handed   Caffeine: 1-2 per day    Social Drivers of Health   Financial Resource Strain: Low Risk  (06/14/2024)   Overall Financial Resource Strain (CARDIA)    Difficulty of Paying Living Expenses: Not hard at all  Food Insecurity: No Food Insecurity (06/14/2024)   Hunger Vital Sign    Worried About Running Out of Food in the Last Year: Never true    Ran Out of Food in the Last Year: Never true  Transportation Needs: No Transportation Needs (06/14/2024)   PRAPARE - Administrator, Civil Service (Medical): No    Lack of Transportation (Non-Medical): No  Physical Activity: Inactive (06/14/2024)   Exercise Vital Sign    Days of Exercise per Week: 0 days    Minutes of Exercise per Session: 0 min  Stress: Stress Concern Present (06/14/2024)   [employer] of Occupational Health - Occupational Stress Questionnaire    Feeling of Stress: To some extent  Social Connections: Moderately Isolated (06/14/2024)   Social  Connection and Isolation Panel    Frequency of Communication with Friends and Family: More than three times a week    Frequency of Social Gatherings with Friends and Family: Once a week    Attends Religious Services: Never    Database Administrator or Organizations: No    Attends Banker Meetings: Never    Marital Status: Married  Catering Manager Violence: Not At Risk (06/14/2024)   Humiliation, Afraid, Rape, and Kick questionnaire    Fear of Current or Ex-Partner: No    Emotionally Abused: No    Physically Abused: No    Sexually Abused: No      PHYSICAL EXAM Generalized: Well developed, in no acute distress   Neurological examination  Mentation: Alert oriented to time, place, history taking. Follows all commands speech and language fluent Cranial nerve II-XII: Facial symmetry noted.   DIAGNOSTIC DATA (LABS, IMAGING, TESTING) - I reviewed patient records, labs, notes, testing and imaging myself where available.  Lab Results  Component Value Date   WBC 10.3 07/14/2024   HGB 16.3 (H) 07/14/2024   HCT 48.2 (H) 07/14/2024   MCV 94.0 07/14/2024   PLT 279 07/14/2024  Component Value Date/Time   NA 141 08/04/2024 1101   K 3.9 08/04/2024 1101   CL 102 08/04/2024 1101   CO2 23 08/04/2024 1101   GLUCOSE 138 (H) 08/04/2024 1101   GLUCOSE 95 07/14/2024 2230   BUN 9 08/04/2024 1101   CREATININE 0.94 08/04/2024 1101   CREATININE 1.03 03/29/2022 1324   CALCIUM  9.4 08/04/2024 1101   PROT 6.5 09/02/2024 1404   ALBUMIN 4.2 09/02/2024 1404   AST 41 (H) 09/02/2024 1404   ALT 31 09/02/2024 1404   ALKPHOS 91 09/02/2024 1404   BILITOT 0.5 09/02/2024 1404   GFRNONAA >60 07/14/2024 2230   GFRNONAA 77 03/19/2021 1457   GFRAA 89 03/19/2021 1457   Lab Results  Component Value Date   CHOL 136 03/25/2024   HDL 35 (L) 03/25/2024   LDLCALC 67 03/25/2024   LDLDIRECT 66.0 04/29/2023   TRIG 203 (H) 03/25/2024   CHOLHDL 3.9 03/25/2024   Lab Results  Component Value  Date   HGBA1C 6.7 (H) 08/04/2024   Lab Results  Component Value Date   VITAMINB12 300 07/31/2020   Lab Results  Component Value Date   TSH 1.850 03/25/2024      ASSESSMENT AND PLAN 58 y.o. year old female  has a past medical history of Anemia, Anxiety, Arrhythmia, Arthritis, Bipolar depression (HCC) (12/28/2019), Cataract, Chronic fatigue, Chronic headaches, Depression, Diabetes mellitus without complication (HCC), Fibromyalgia, GERD (gastroesophageal reflux disease), Heart murmur, High cholesterol, HLD (hyperlipidemia), Hypertension, Insect bite, Migraine, Prediabetes, Sleep apnea, Thyroid  disease, Urinary incontinence, and Vasculitis. here with:  Migraine headaches with aura  Headaches have been under relatively good control.  She is currently not on a preventative medication.  I will resend in sumatriptan .  Reviewed side effects with the patient and placed information on her AVS.  Advised to take sumatriptan  at the onset of a migraine can repeat in 2 hours if needed.  No more than 2 tablets in 24 hours.  In regards to new symptom that she describes as shakiness advised that she should first consult with her primary care.  If he feels that a neurological consult is needed he can place a referral.  She voiced understanding.  Follow-up with our office as needed   Meds ordered this encounter  Medications   SUMAtriptan  (IMITREX ) 100 MG tablet    Sig: Take 1 tablet at the onset of migraine. May repeat in 2 hours if headache persists or recurs. Maximum 2 per day.    Dispense:  10 tablet    Refill:  2    Supervising Provider:   YAN, YIJUN [[id number]]     Duwaine Russell, MSN, NP-C 11/30/2024, 3:08 PM [facility] [address] [place], [place] [address] [phone]  The patient's condition requires frequent monitoring and adjustments in the treatment plan, reflecting the ongoing complexity of care.  This provider is the continuing focal point for all needed  services for this condition.

## 2024-12-01 ENCOUNTER — Other Ambulatory Visit: Payer: Self-pay | Admitting: Internal Medicine

## 2024-12-02 ENCOUNTER — Ambulatory Visit (HOSPITAL_BASED_OUTPATIENT_CLINIC_OR_DEPARTMENT_OTHER): Admitting: Pulmonary Disease

## 2024-12-02 ENCOUNTER — Telehealth (HOSPITAL_BASED_OUTPATIENT_CLINIC_OR_DEPARTMENT_OTHER): Payer: Self-pay | Admitting: Pulmonary Disease

## 2024-12-02 NOTE — Telephone Encounter (Signed)
 Left voicemail for patient. There is inclement weather likely happening, and the office has reached out to patients to request the morning appointments switch to virtual appointments. If patient is unable to do virtual, she will need to reschedule to next available.

## 2024-12-03 ENCOUNTER — Ambulatory Visit (HOSPITAL_BASED_OUTPATIENT_CLINIC_OR_DEPARTMENT_OTHER): Admitting: Pulmonary Disease

## 2024-12-06 ENCOUNTER — Encounter: Payer: Self-pay | Admitting: Pharmacist

## 2024-12-06 NOTE — Progress Notes (Signed)
 Pharmacy Quality Measure Review  This patient is appearing on a report for being at risk of failing the adherence measure for cholesterol (statin) medications this calendar year.   Medication: rosuvastatin   Last fill date: 07/30/2024 for 90 day supply  Sees Dr Tobie 12/16/2024.   Left voicemail for patient. Reminded her that she has refills available at her pharmacy and that rosuvastatin  refill is due.   Madelin Ray, PharmD Clinical Pharmacist [facility]  Population Health [phone]

## 2024-12-16 ENCOUNTER — Ambulatory Visit: Admitting: Internal Medicine

## 2024-12-19 ENCOUNTER — Encounter: Payer: Self-pay | Admitting: Physical Medicine and Rehabilitation

## 2024-12-19 ENCOUNTER — Other Ambulatory Visit: Payer: Self-pay | Admitting: Physical Medicine and Rehabilitation

## 2024-12-27 ENCOUNTER — Ambulatory Visit: Admitting: Internal Medicine

## 2024-12-30 ENCOUNTER — Other Ambulatory Visit: Payer: Self-pay | Admitting: "Endocrinology

## 2024-12-30 DIAGNOSIS — E1165 Type 2 diabetes mellitus with hyperglycemia: Secondary | ICD-10-CM

## 2024-12-31 ENCOUNTER — Encounter (HOSPITAL_BASED_OUTPATIENT_CLINIC_OR_DEPARTMENT_OTHER): Payer: Self-pay

## 2024-12-31 ENCOUNTER — Ambulatory Visit (HOSPITAL_BASED_OUTPATIENT_CLINIC_OR_DEPARTMENT_OTHER): Admitting: Pulmonary Disease

## 2024-12-31 ENCOUNTER — Ambulatory Visit (INDEPENDENT_AMBULATORY_CARE_PROVIDER_SITE_OTHER)

## 2024-12-31 ENCOUNTER — Encounter (INDEPENDENT_AMBULATORY_CARE_PROVIDER_SITE_OTHER): Payer: Self-pay

## 2024-12-31 VITALS — BP 114/73 | HR 83 | Ht 69.5 in | Wt 257.0 lb

## 2024-12-31 DIAGNOSIS — J328 Other chronic sinusitis: Secondary | ICD-10-CM

## 2024-12-31 NOTE — Progress Notes (Signed)
 Dear Dr. Tobie, Here is my assessment for our mutual patient, Sydney Taylor. Thank you for allowing me the opportunity to care for your patient. Please do not hesitate to contact me should you have any other questions. Sincerely, Dr. Hadassah Parody  Otolaryngology Clinic Note Referring provider: Dr. Tobie HPI:   Initial HPI (12/31/2024) 59 year old female with chronic sinusitis, prior sinus surgery presents with persistent sinus symptoms following an upper respiratory tract infection.  She has had persistent sinonasal symptoms for 6 weeks following an upper respiratory tract infection, progressing to a sinus infection.  She has persistent thick yellow nasal discharge.  Facial pain localized to sinus region.  Nasal congestion.  Performs twice daily saline irrigations containing budesonide , mupirocin and mometasone as previously prescribed by Dr. Karis.  Symptomatic improvement with regular use of irrigations however symptoms will recur if the doses are missed. Not currently using any nasal sprays.  Previously used Flonase  without significant benefit. No steroids due to history of diabetes and glycemic control issues with this.  She has allergic rhinitis and uses loratadine.  Last sinus surgery was 2014 or 2015. Last sinus CT was in 2022  Independent Review of Additional Tests or Records:  Referral note 11/18/2024 Suzzane Tobie, MD: Video visit for complaint of nasal congestion, sinus pressure related headache and cough with yellowish expectoration over the last 2 weeks.  Felt like this was acute sinusitis referred to Dr. Karis as per patient request  CT sinus 03/14/2021 independently reviewed and shows clear sinuses bilaterally, right concha bullosa. PMH/Meds/All/SocHx/FamHx/ROS:   Past Medical History:  Diagnosis Date   Anemia    Anxiety    Arrhythmia    Arthritis    Bipolar depression (HCC) 12/28/2019   Cataract    Chronic fatigue    Chronic headaches    Depression    Diabetes  mellitus without complication (HCC)    Fibromyalgia    GERD (gastroesophageal reflux disease)    Heart murmur    High cholesterol    HLD (hyperlipidemia)    Hypertension    Insect bite    Migraine    Prediabetes    Sleep apnea    Thyroid  disease    Urinary incontinence    Vasculitis      Past Surgical History:  Procedure Laterality Date   BIOPSY  09/04/2022   Procedure: BIOPSY;  Surgeon: Shaaron Lamar HERO, MD;  [facility]: [facility];  Service: Endoscopy;;   CHOLECYSTECTOMY N/A 03/15/2022   Procedure: LAPAROSCOPIC CHOLECYSTECTOMY WITH INTRAOPERATIVE CHOLANGIOGRAM;  Surgeon: Kallie Manuelita BROCKS, MD;  Location: [facility];  Service: General;  Laterality: N/A;   CHOLECYSTECTOMY  02/03/2022   COLONOSCOPY  2017   per patient normal and needs next one in 2027 (Dr. Tobie)   COLONOSCOPY WITH PROPOFOL  N/A 09/04/2022   Procedure: COLONOSCOPY WITH PROPOFOL ;  Surgeon: Shaaron Lamar HERO, MD;  [facility]: [facility];  Service: Endoscopy;  Laterality: N/A;   ESOPHAGOGASTRODUODENOSCOPY N/A 05/12/2020   Procedure: ESOPHAGOGASTRODUODENOSCOPY (EGD);  Surgeon: Shaaron Lamar HERO, MD;  [facility]: [facility];  Service: Endoscopy;  Laterality: N/A;  11:15am   Joint fusion on foot Right    LIVER BIOPSY N/A 03/15/2022   Procedure: LIVER BIOPSY;  Surgeon: Kallie Manuelita BROCKS, MD;  Location: [facility];  Service: General;  Laterality: N/A;   MALONEY DILATION N/A 05/12/2020   Procedure: AGAPITO HODGKIN;  Surgeon: Shaaron Lamar HERO, MD;  [facility]: [facility];  Service: Endoscopy;  Laterality: N/A;   NASAL SEPTUM SURGERY     POLYPECTOMY  09/04/2022   Procedure: POLYPECTOMY;  Surgeon: Shaaron Lamar HERO, MD;  [facility]: [facility];  Service: Endoscopy;;   SINOSCOPY      Family History  Problem Relation Age of Onset   Hypertension Father    Hyperlipidemia Father    Diabetes Father    Stroke Father    Depression Father    Anxiety disorder Father    Hearing loss Father    Heart disease Father    Obesity Father     Hypertension Mother    Hyperlipidemia Mother    Diabetes Mellitus II Mother    Congestive Heart Failure Mother    Heart attack Mother    Anxiety disorder Mother    Arthritis Mother    Diabetes Mother    Heart disease Mother    Obesity Mother    Varicose Veins Mother    Hypertension Sister    Diabetes Sister    Hyperlipidemia Sister    Obesity Sister    Hypertension Brother    Autoimmune disease Brother    Vision loss Brother    Diabetes Maternal Grandmother    Arthritis Maternal Grandmother    Obesity Maternal Grandmother    Heart disease Maternal Grandfather    Stroke Maternal Grandfather    Lupus Paternal Grandmother    Depression Paternal Grandmother    Anxiety disorder Paternal Grandmother    Heart disease Paternal Grandmother    Alcohol abuse Paternal Grandfather    Depression Maternal Aunt    Anxiety disorder Maternal Aunt    Cancer Maternal Aunt    Diabetes Paternal Uncle    Colon cancer Neg Hx    Esophageal cancer Neg Hx    Gastric cancer Neg Hx      Social Connections: Moderately Isolated (06/14/2024)   Social Connection and Isolation Panel    Frequency of Communication with Friends and Family: More than three times a week    Frequency of Social Gatherings with Friends and Family: Once a week    Attends Religious Services: Never    Database Administrator or Organizations: No    Attends Engineer, Structural: Never    Marital Status: Married     Current Outpatient Medications  Medication Instructions   ALPRAZolam (XANAX) 0.25 mg, Daily PRN   ARIPiprazole (ABILIFY) 5 mg, Daily   B-12 Methylcobalamin 1,000 mcg, Daily   blood glucose meter kit and supplies KIT 1 each, Subcutaneous, Daily, Check blood glucose once a day. Diagnosis code: E 11.9.   budesonide  (PULMICORT ) 0.5 MG/2ML nebulizer solution Open/mix contents of 1 vial into 240 ml of sinus rinse bottle and irrigate sinuses twice daily.   calcium  carbonate (TUMS - DOSED IN MG ELEMENTAL  CALCIUM ) 1,000 mg, Daily PRN   chlorthalidone  (HYGROTON ) 12.5 mg, Daily   Cholecalciferol  (VITAMIN D3) 25 MCG (1000 UT) CAPS Daily   Continuous Glucose Receiver (DEXCOM G7 RECEIVER) DEVI 1 each, Continuous   Continuous Glucose Sensor (DEXCOM G7 SENSOR) MISC Use to check glucose continuously, change sensor every 10 days   DULoxetine (CYMBALTA) 120 mg, Every morning   gabapentin  (NEURONTIN ) 600 mg, Oral, Daily at bedtime   glucose blood (ACCU-CHEK GUIDE) test strip 1 each, Other, 3 times daily, Use as instructed   insulin  lispro (HUMALOG  KWIKPEN) 1-5 Units, Subcutaneous, As needed   Insulin  Pen Needle (RELION PEN NEEDLES) 31G X 6 MM MISC USE 2 PEN NEEDLEs to check blood glucose twice a day   Jardiance  25 mg, Oral, Daily before breakfast   L-Methylfolate 15  mg, Daily   losartan  (COZAAR ) 25 mg, Oral, Daily   MAGNESIUM  GLYCINATE PO Daily   metFORMIN  (GLUCOPHAGE -XR) 500 MG 24 hr tablet TAKE 2 TABLETS BY MOUTH ONCE DAILY WITH SUPPER   metoprolol  tartrate (LOPRESSOR ) 25 mg, Oral, 2 times daily   modafinil (PROVIGIL) 100 mg, Every morning   moxifloxacin  (AVELOX ) 400 mg, Oral, Daily   Multiple Vitamins-Minerals (MULTIVITAMIN WOMEN 50+) TABS 1 tablet, Daily   NALTREXONE HCL PO Daily   NONFORMULARY OR COMPOUNDED ITEM 8 mg, Oral, Daily at bedtime, [facility]   omega-3 acid ethyl esters (LOVAZA ) 2 g, Oral, 2 times daily   pantoprazole  (PROTONIX ) 40 mg, Oral, Daily before breakfast   potassium chloride  SA (KLOR-CON  M) 20 MEQ tablet Oral, 2 times daily   rosuvastatin  (CRESTOR ) 5 mg, Oral, Daily   SUMAtriptan  (IMITREX ) 100 MG tablet Take 1 tablet at the onset of migraine. May repeat in 2 hours if headache persists or recurs. Maximum 2 per day.   tirzepatide  (MOUNJARO ) 7.5 mg, Subcutaneous, Weekly   topiramate  (TOPAMAX ) 25 mg, Oral, Daily at bedtime   Tresiba  FlexTouch 86 Units, Subcutaneous, 2 times daily   UNABLE TO FIND Med Name: Mupirocin 20 mg caps. Place contents of 1 capsule into 240 ml  of sinus rinse bottle and irrigate sinuses twice daily.     Physical Exam:   BP 114/73 (BP Location: Right Arm, Patient Position: Sitting)   Pulse 83   Ht 5' 9.5 (1.765 m)   Wt 257 lb (116.6 kg)   LMP  (LMP Unknown)   SpO2 95%   BMI 37.41 kg/m   Salient findings:  CN II-XII intact   Bilateral EAC clear and TM intact with well pneumatized middle ear spaces  Anterior rhinoscopy: Septum midline; bilateral inferior turbinates with hypertrophy  No lesions of oral cavity/oropharynx  No obviously palpable neck masses/lymphadenopathy/thyromegaly  No respiratory distress or stridor   Seprately Identifiable Procedures:  Prior to initiating any procedures, risks/benefits/alternatives were explained to the patient and verbal consent obtained.  PROCEDURE (12/31/2024): Bilateral Diagnostic Rigid Nasal Endoscopy Pre-procedure diagnosis: Concern for chronic sinusitis Post-procedure diagnosis: same Indication: See pre-procedure diagnosis and physical exam above Complications: None apparent EBL: 0 mL Anesthesia: Lidocaine  4% and topical decongestant was topically sprayed in each nasal cavity  Description of Procedure:  Patient was identified. A rigid 30 degree endoscope was utilized to evaluate the sinonasal cavities, mucosa, sinus ostia and turbinates and septum.  Overall, signs of mucosal inflammation are noted.  Also noted is mucosal edema throughout. no mucopurulence, polyps, or masses noted.   Right Middle meatus: Clear Right SE Recess: Clear Left MM: Clear Left SE Recess: Clear Photodocumentation was obtained.  CPT CODE -- 68768 - Mod 25   Impression & Plans:  Nikkol Pai is a 59 y.o. female with   1. Other chronic sinusitis     Assessment and Plan Assessment & Plan Chronic sinusitis Chronic sinusitis with prior sinus surgery and recurrent infections. Current episode is subacute, with persistent symptoms for six weeks post-upper respiratory infection. Endoscopic  examination demonstrated mild mucosal edema and scant anterior nasal mucus, without acute purulent infection at least from what I can see on scope exam.  Differential includes chronic sinusitis with retained secretions or delayed mucosal recovery. No evidence of acute bacterial sinusitis from what I can see on scope exam.  She is stable with saline irrigations containing budesonide  and mupirocin but worsens when she does not use them. Systemic steroids are contraindicated due to diabetes.  Discussed continued  observation versus CT scan to rule out underlying sinus disease that I am not able to see on scope exam, particularly given that she has not improved despite saline irrigations and medication use.  She is interested in doing the sinus CT scan.  Her last scan was clear but this was 4 years ago. - Ordered sinus CT to assess for residual disease or chronic infection. - Recommended continuation of saline irrigations with budesonide  and mupirocin. - Advised maintenance of regular nasal rinses for symptom control. - Scheduled follow-up after CT to review results and reassess management.   See below regarding exact medications prescribed this encounter including dosages and route: No orders of the defined types were placed in this encounter.    Thank you for allowing me the opportunity to care for your patient. Please do not hesitate to contact me should you have any other questions.  Sincerely, Hadassah Parody, MD Otolaryngologist (ENT), [facility] Phone: [phone] Fax: [phone]  MDM:  Level 4 Complexity/Problems addressed: 4-chronic worsening problem Data complexity: 4-  independent review of CT sinus - Morbidity: -   - Prescription Drug prescribed or managed: no

## 2024-12-31 NOTE — Patient Instructions (Signed)
 I have ordered an imaging study for you to complete prior to your next visit. Please call [facility] Scheduling at [phone] to schedule your imaging if you have not received a call within 24 hours. If you are unable to complete your imaging study prior to your next scheduled visit please call our office to let us  know.

## 2025-01-03 NOTE — Telephone Encounter (Signed)
 Requested Prescriptions   Pending Prescriptions Disp Refills   JARDIANCE  25 MG TABS tablet [Pharmacy Med Name: Jardiance  25 MG Oral Tablet] 30 tablet 0    Sig: TAKE 1 TABLET BY MOUTH ONCE DAILY BEFORE BREAKFAST

## 2025-01-05 ENCOUNTER — Inpatient Hospital Stay: Attending: Hematology

## 2025-01-05 ENCOUNTER — Inpatient Hospital Stay

## 2025-01-06 ENCOUNTER — Inpatient Hospital Stay

## 2025-01-07 ENCOUNTER — Inpatient Hospital Stay: Attending: Hematology

## 2025-01-09 ENCOUNTER — Encounter: Payer: Self-pay | Admitting: Internal Medicine

## 2025-01-09 ENCOUNTER — Other Ambulatory Visit: Payer: Self-pay | Admitting: Internal Medicine

## 2025-01-10 ENCOUNTER — Ambulatory Visit (HOSPITAL_BASED_OUTPATIENT_CLINIC_OR_DEPARTMENT_OTHER): Admitting: Pulmonary Disease

## 2025-01-10 ENCOUNTER — Encounter (HOSPITAL_BASED_OUTPATIENT_CLINIC_OR_DEPARTMENT_OTHER): Payer: Self-pay | Admitting: Pulmonary Disease

## 2025-01-10 ENCOUNTER — Other Ambulatory Visit: Payer: Self-pay

## 2025-01-10 ENCOUNTER — Inpatient Hospital Stay: Attending: Hematology

## 2025-01-10 ENCOUNTER — Other Ambulatory Visit: Payer: Self-pay | Admitting: "Endocrinology

## 2025-01-10 ENCOUNTER — Encounter (HOSPITAL_BASED_OUTPATIENT_CLINIC_OR_DEPARTMENT_OTHER): Payer: Self-pay

## 2025-01-10 VITALS — BP 136/90 | HR 86 | Temp 97.8°F | Ht 69.0 in | Wt 261.1 lb

## 2025-01-10 DIAGNOSIS — G4710 Hypersomnia, unspecified: Secondary | ICD-10-CM | POA: Diagnosis not present

## 2025-01-10 DIAGNOSIS — G4733 Obstructive sleep apnea (adult) (pediatric): Secondary | ICD-10-CM

## 2025-01-10 DIAGNOSIS — E611 Iron deficiency: Secondary | ICD-10-CM

## 2025-01-10 LAB — CBC WITH DIFFERENTIAL/PLATELET
Abs Immature Granulocytes: 0.06 K/uL (ref 0.00–0.07)
Basophils Absolute: 0.1 K/uL (ref 0.0–0.1)
Basophils Relative: 1 %
Eosinophils Absolute: 0.6 K/uL — ABNORMAL HIGH (ref 0.0–0.5)
Eosinophils Relative: 5 %
HCT: 47.5 % — ABNORMAL HIGH (ref 36.0–46.0)
Hemoglobin: 16.3 g/dL — ABNORMAL HIGH (ref 12.0–15.0)
Immature Granulocytes: 1 %
Lymphocytes Relative: 24 %
Lymphs Abs: 2.7 K/uL (ref 0.7–4.0)
MCH: 32.7 pg (ref 26.0–34.0)
MCHC: 34.3 g/dL (ref 30.0–36.0)
MCV: 95.4 fL (ref 80.0–100.0)
Monocytes Absolute: 0.9 K/uL (ref 0.1–1.0)
Monocytes Relative: 8 %
Neutro Abs: 6.9 K/uL (ref 1.7–7.7)
Neutrophils Relative %: 61 %
Platelets: 299 K/uL (ref 150–400)
RBC: 4.98 MIL/uL (ref 3.87–5.11)
RDW: 13.2 % (ref 11.5–15.5)
WBC: 11.2 K/uL — ABNORMAL HIGH (ref 4.0–10.5)
nRBC: 0 % (ref 0.0–0.2)

## 2025-01-10 LAB — IRON AND TIBC
Iron: 76 ug/dL (ref 28–170)
Saturation Ratios: 23 % (ref 10.4–31.8)
TIBC: 337 ug/dL (ref 250–450)
UIBC: 262 ug/dL

## 2025-01-10 LAB — FERRITIN: Ferritin: 393 ng/mL — ABNORMAL HIGH (ref 11–307)

## 2025-01-10 LAB — VITAMIN B12: Vitamin B-12: 2687 pg/mL — ABNORMAL HIGH (ref 180–914)

## 2025-01-10 MED ORDER — POTASSIUM CHLORIDE CRYS ER 20 MEQ PO TBCR: 20.0000 meq | EXTENDED_RELEASE_TABLET | Freq: Two times a day (BID) | ORAL | 0 refills | Status: AC

## 2025-01-10 NOTE — Patient Instructions (Signed)
 X Order for new auto CPAP 8-13 cm

## 2025-01-10 NOTE — Progress Notes (Signed)
" ° °  Subjective:    Patient ID: [name], female    DOB: Oct 10, 1966, 59 y.o.   MRN: [id number]   59 yo woman for follow-up of OSA DME -[facility]   PMH -diabetes type 2, hypertension, fibromyalgia, tachycardia    Discussed the use of AI scribe software for clinical note transcription with the patient, who gave verbal consent to proceed.  History of Present Illness Mishal Probert is a 59 year old female with obstructive sleep apnea and fibromyalgia who presents for follow-up regarding her CPAP machine and management of chronic fatigue.  Her CPAP machine now displays a message that it has exceeded its motor life, although it still functions. She has not yet received a replacement and is waiting for [facility] to resolve this.  She has chronic fatigue that she relates to fibromyalgia. Modafinil 100 mg each morning improves her fatigue but causes jitteriness and heart palpitations at higher doses.  She no longer has headaches since starting CPAP. She uses sinus rinses and takes gabapentin  300 mg at bedtime for fibromyalgia.  She is on Mounjaro  for weight management and has had mild weight loss without significant side effects.     Significant tests/ events reviewed   10/2015 NPSG >> mod OSA, AHI 26/h, low spO2 77% 11/2015 CPAP >> 8 cm    Review of Systems  neg for any significant sore throat, dysphagia, itching, sneezing, nasal congestion or excess/ purulent secretions, fever, chills, sweats, unintended wt loss, pleuritic or exertional cp, hempoptysis, orthopnea pnd or change in chronic leg swelling. Also denies presyncope, palpitations, heartburn, abdominal pain, nausea, vomiting, diarrhea or change in bowel or urinary habits, dysuria,hematuria, rash, arthralgias, visual complaints, headache, numbness weakness or ataxia.      Objective:   Physical Exam  Gen. Pleasant, obese, in no distress ENT - no lesions, no post nasal drip Neck: No JVD, no thyromegaly, no carotid  bruits Lungs: no use of accessory muscles, no dullness to percussion, decreased without rales or rhonchi  Cardiovascular: Rhythm regular, heart sounds  normal, no murmurs or gallops, no peripheral edema Musculoskeletal: No deformities, no cyanosis or clubbing , no tremors       Assessment & Plan:   Assessment and Plan Assessment & Plan Obstructive sleep apnea Current machine has exceeded motor life but remains functional. No recent headaches since using CPAP. Compliance with CPAP therapy is good. [facility] is unable to locate the previous order for a new CPAP machine, necessitating a new order. - Sent new order for CPAP machine to [facility]. - Advised to contact [facility] if no response in two weeks.  Hypersomnolence Managed with modafinil. Current regimen is 100 mg in the morning, which helps but causes jitteriness and heart palpitations. Considering adjusting to 50 mg twice a day to manage symptoms without side effects. Tiredness may be related to fibromyalgia or residual hypersomnolence from OSA. - Continue modafinil 100 mg in the morning or consider 50 mg twice a day to manage hypersomnolence. - Monitor for side effects and adjust dosage as needed.     "

## 2025-01-11 NOTE — Progress Notes (Unsigned)
 "  VIRTUAL VISIT via TELEPHONE NOTE [facility]   I connected with Delon Sydney Taylor Pinal  on 01/12/2025 at 10:40 AM by telephone and verified that I am speaking with the correct person using two identifiers.  Location: Patient: Home Provider: [facility]   I discussed the limitations, risks, security and privacy concerns of performing an evaluation and management service by telephone and the availability of in person appointments. I also discussed with the patient that there may be a patient responsible charge related to this service. The patient expressed understanding and agreed to proceed.  REASON FOR VISIT:  Follow-up for iron  deficiency anemia  PRIOR THERAPY: Oral iron  (unable to tolerate due to stomach pain)  CURRENT THERAPY: Intermittent IV iron   INTERVAL HISTORY:   Sydney Taylor 59 y.o. female returns for routine follow-up of iron  deficiency state.   She was last seen by Pleasant Barefoot PA-C on 06/30/2024. Most recent INFeD  x 1 g on 07/20/2024.  At today's visit, she reports feeling fair.   No recent hospitalizations, surgeries, or changes in baseline health status. She has 60% energy and 75% appetite.  She endorses that she is maintaining a stable weight.  After receiving INFeD , she noticed resolution of palpitations and shortness of breath. She has chronic fatigue from fibromyalgia, but did note some slight improvement in her energy. Fingernail peeling has resolved. She has chronic migraine headaches. She denies any dyspnea, chest pain, lightheadedness, syncope, pica. No rectal bleeding or melena.  ASSESSMENT & PLAN:  1.  Iron  deficiency state: - She was seen at the request of Dr. Tobie.  Labs from PCP (11/24/2023) with ferritin 20/iron  saturation 9%, normal hemoglobin 14.1. - Unable to tolerate oral iron  due to stomach pain.  - No prior history of PRBC transfusion. - Colonoscopy on 09/04/2022 colon polyp x 1, otherwise normal colon. - Has required  intermittent IV iron  for symptomatic iron  deficiency (extreme fatigue with ferritin <100).  Most recent INFeD  x 1 g on 07/20/2024. - Some persistent fatigue following IV iron , unclear if this is due to iron  deficiency or fibromyalgia. - She denies any BRBPR/melena.  No B symptoms.  - Most recent labs (01/10/2025): Hgb 16.3/MCV 95.4.  Ferritin 393, iron  saturation 23% - Her vitamin B12 level is 2687.  She takes Vitamin B12 daily 3,000 mcg at home.   - PLAN: No indication for IV iron .  If she has ferritin <100 in the future, we could consider a trial of ferrous bisglycinate to see if this is better tolerated. - She can STOP B12 supplements.  Will recheck at follow-up and consider restarting at lower dose if needed. - RTC in 6 months  2.  Erythrocytosis - Intermittent erythrocytosis dating back to 2021 - Patient has OSA: Compliant with CPAP, but needs new machine.  Followed by Dr. Jude (pulmonology) - Reports that she is not very good about drinking water (drinks about 24 ounces of water daily) - She does not smoke or vape. -- No vasomotor symptoms, but has ongoing fatigue - Most recent CBC/D (01/10/2025): Hgb 16.3, hematocrit 47.5%. - PLAN: Check JAK2 with reflex to rule out any developing MPN.  Will also check EPO levels.   If normal, suspect secondary erythrocytosis related to dehydration and OSA.  Mild secondary erythrocytosis does not require treatment, and she can return to clinic in 1 year for follow-up. If positive for MPN, we will bring her back sooner for additional discussion she can RTC in 1 year for follow-up.   -  WBC at baseline is on the higher end of normal, but she does have mild leukocytosis on most recent labs.  She reports that she is on the tail end of a sinus infection.  Attention at follow-up.  If persistent leukocytosis consider checking BCR/ABL or flow cytometry as appropriate.  3.  Social/Family History: -- Retired. No tobacco use. Independent of ADL's and IADL's.  - No  family history of anemia or thalassemia. Maternal aunt had breast cancer. No other family history of cancer.  PLAN SUMMARY: >> Labs within the next 1 to 2 weeks (at patient's convenience) = erythropoietin + JAK2 with reflex >> Labs in 6 months = CBC/D, B12, MMA, ferritin, iron /TIBC >> OFFICE visit in 6 months (1 week after labs)     REVIEW OF SYSTEMS:   Review of Systems  Constitutional:  Positive for malaise/fatigue. Negative for chills, diaphoresis, fever and weight loss.  Respiratory:  Negative for cough and shortness of breath.   Cardiovascular:  Negative for chest pain and palpitations.  Gastrointestinal:  Positive for constipation. Negative for abdominal pain, blood in stool, melena, nausea and vomiting.  Musculoskeletal:  Positive for myalgias.  Neurological:  Positive for headaches. Negative for dizziness.     PHYSICAL EXAM: (per limitations of virtual telephone visit)  The patient is alert and oriented x 3, exhibiting adequate mentation, good mood, and ability to speak in full sentences and execute sound judgement.  WRAP UP:   I discussed the assessment and treatment plan with the patient. The patient was provided an opportunity to ask questions and all were answered. The patient agreed with the plan and demonstrated an understanding of the instructions.   The patient was advised to call back or seek an in-person evaluation if the symptoms worsen or if the condition fails to improve as anticipated.  I provided 22 minutes of non-face-to-face time during this encounter, including >10 minutes of medical discussion  Pleasant CHRISTELLA Lamon DEVONNA 01/12/2025 11:58 AM  "

## 2025-01-12 ENCOUNTER — Inpatient Hospital Stay (HOSPITAL_BASED_OUTPATIENT_CLINIC_OR_DEPARTMENT_OTHER): Admitting: Physician Assistant

## 2025-01-12 ENCOUNTER — Other Ambulatory Visit: Payer: Self-pay | Admitting: "Endocrinology

## 2025-01-12 DIAGNOSIS — E538 Deficiency of other specified B group vitamins: Secondary | ICD-10-CM

## 2025-01-12 DIAGNOSIS — Z803 Family history of malignant neoplasm of breast: Secondary | ICD-10-CM | POA: Diagnosis not present

## 2025-01-12 DIAGNOSIS — E611 Iron deficiency: Secondary | ICD-10-CM | POA: Diagnosis not present

## 2025-01-12 DIAGNOSIS — E1140 Type 2 diabetes mellitus with diabetic neuropathy, unspecified: Secondary | ICD-10-CM

## 2025-01-12 DIAGNOSIS — D751 Secondary polycythemia: Secondary | ICD-10-CM

## 2025-01-12 LAB — METHYLMALONIC ACID, SERUM: Methylmalonic Acid, Quantitative: 153 nmol/L (ref 0–378)

## 2025-01-13 ENCOUNTER — Ambulatory Visit (HOSPITAL_BASED_OUTPATIENT_CLINIC_OR_DEPARTMENT_OTHER): Admitting: Pulmonary Disease

## 2025-01-14 ENCOUNTER — Telehealth: Admitting: "Endocrinology

## 2025-01-14 ENCOUNTER — Encounter: Payer: Self-pay | Admitting: "Endocrinology

## 2025-01-14 VITALS — Ht 69.0 in | Wt 256.0 lb

## 2025-01-14 DIAGNOSIS — Z794 Long term (current) use of insulin: Secondary | ICD-10-CM | POA: Diagnosis not present

## 2025-01-14 DIAGNOSIS — E1140 Type 2 diabetes mellitus with diabetic neuropathy, unspecified: Secondary | ICD-10-CM | POA: Diagnosis not present

## 2025-01-14 DIAGNOSIS — Z7984 Long term (current) use of oral hypoglycemic drugs: Secondary | ICD-10-CM

## 2025-01-14 DIAGNOSIS — E782 Mixed hyperlipidemia: Secondary | ICD-10-CM | POA: Diagnosis not present

## 2025-01-14 DIAGNOSIS — Z7985 Long-term (current) use of injectable non-insulin antidiabetic drugs: Secondary | ICD-10-CM

## 2025-01-14 MED ORDER — LANTUS SOLOSTAR 100 UNIT/ML ~~LOC~~ SOPN: 80.0000 [IU] | PEN_INJECTOR | Freq: Two times a day (BID) | SUBCUTANEOUS | 2 refills | Status: DC

## 2025-01-14 MED ORDER — TIRZEPATIDE 10 MG/0.5ML ~~LOC~~ SOAJ: 10.0000 mg | SUBCUTANEOUS | 1 refills | Status: DC

## 2025-01-14 MED ORDER — PEN NEEDLES 32G X 4 MM MISC: 1.0000 | Freq: Four times a day (QID) | 5 refills | Status: AC

## 2025-01-14 NOTE — Patient Instructions (Addendum)
 Will recommend the following: Jardiance  25 mg daily, metformin  ER 1 g at dinner Lantus  80 units qam, 80 units qpm (decrease by 2 units if blood sugar drops less than 100) Mounjaro  7.5 mg once a week, patient wants to try again - could help her binge eating  Disucced S/E, no current contraindication  Humalog  correction scale: Use in addition to your meal time/short acting insulin  based on blood sugars as follows: 201 - 250: 2 units 251 - 300: 3 units 301 - 350: 4 units 351 - 400: 5 units  ___________    Goals of DM therapy:  Morning Fasting blood sugar: 80-140  Blood sugar before meals: 80-140 Bed time blood sugar: 100-150  A1C <7%, limited only by hypoglycemia  1.Diabetes medications and their side effects discussed, including hypoglycemia    2. Check blood glucose:  a) Always check blood sugars before driving. Please see below (under hypoglycemia) on how to manage b) Check a minimum of 3 times/day or more as needed when having symptoms of hypoglycemia.   c) Try to check blood glucose before sleeping/in the middle of the night to ensure that it is remaining stable and not dropping less than 100 d) Check blood glucose more often if sick  3. Diet: a) 3 meals per day schedule b: Restrict carbs to 60-70 grams (4 servings) per meal c) Colorful vegetables - 3 servings a day, and low sugar fruit 2 servings/day Plate control method: 1/4 plate protein, 1/4 starch, 1/2 green, yellow, or red vegetables d) Avoid carbohydrate snacks unless hypoglycemic episode, or increased physical activity  4. Regular exercise as tolerated, preferably 3 or more hours a week  5. Hypoglycemia: a)  Do not drive or operate machinery without first testing blood glucose to assure it is over 90 mg%, or if dizzy, lightheaded, not feeling normal, etc, or  if foot or leg is numb or weak. b)  If blood glucose less than 70, take four 5gm Glucose tabs or 15-30 gm Glucose gel.  Repeat every 15 min as needed until  blood sugar is >100 mg/dl. If hypoglycemia persists then call 911.   6. Sick day management: a) Check blood glucose more often b) Continue usual therapy if blood sugars are elevated.   7. Contact the doctor immediately if blood glucose is frequently <60 mg/dl, or an episode of severe hypoglycemia occurs (where someone had to give you glucose/  glucagon  or if you passed out from a low blood glucose), or if blood glucose is persistently >350 mg/dl, for further management  8. A change in level of physical activity or exercise and a change in diet may also affect your blood sugar. Check blood sugars more often and call if needed.  Instructions: 1. Bring glucose meter, blood glucose records on every visit for review 2. Continue to follow up with primary care physician and other providers for medical care 3. Yearly eye  and foot exam 4. Please get blood work done prior to the next appointment

## 2025-01-14 NOTE — Progress Notes (Signed)
 "  The patient reports they are currently: Sydney Taylor. I spent 9-10 minutes on the video with the patient on the date of service. I spent an additional 5 minutes on pre- and post-visit activities on the date of service.   The patient was physically located in [place]  or a state in which I am permitted to provide care. The patient and/or parent/guardian understood that s/he may incur co-pays and cost sharing, and agreed to the telemedicine visit. The visit was reasonable and appropriate under the circumstances given the patient's presentation at the time.  The patient and/or parent/guardian understands the potential risks and limitations of this mode of treatment (including, but not limited to, the absence of in-person examination) and has agreed to be treated using telemedicine. The patient's/patient's family's questions regarding telemedicine have been answered.   The patient and/or parent/guardian will contact their provider's office for worsening conditions, and seek emergency medical treatment and/or call 911 if the patient deems either necessary.        Outpatient Endocrinology Note Sydney Birmingham, MD  01/14/25   Sydney Taylor Sydney Taylor Sydney Taylor  Referring Provider: Tobie Suzzane MARLA, MD Primary Care Provider: Tobie Suzzane MARLA, MD Reason for consultation: Sydney Taylor   Assessment & Plan  Diagnoses and all orders for this visit:  Type 2 diabetes mellitus with diabetic neuropathy, with long-term current use of insulin  (HCC)  Long term (current) use of oral hypoglycemic drugs  Long-term (current) use of injectable non-insulin  antidiabetic drugs  Long-term insulin  use (HCC)  Mixed hyperlipidemia  Other orders -     insulin  glargine (LANTUS  SOLOSTAR) 100 UNIT/ML Solostar Pen; Inject 80 Units into the skin 2 (two) times daily. -     tirzepatide  (MOUNJARO ) 10 MG/0.5ML Pen; Inject 10 mg into the skin once a week. -     Insulin  Pen Needle (PEN NEEDLES) 32G X 4 MM MISC; 1  Device by Does not apply route in the morning, at noon, in the evening, and at bedtime.    Diabetes Type II complicated by neuropathy,  Lab Results  Component Value Date   GFR 57.44 (L) 08/11/2023   Hba1c goal less than 7, current Hba1c is  Lab Results  Component Value Date   HGBA1C 6.7 (H) 08/04/2024   Will recommend the following: Jardiance  25 mg daily, metformin  ER 1 g at dinner Lantus  80 units qam, 80 units qpm (decrease by 2 units if blood sugar drops less than 100) Mounjaro  7.5 mg once a week, patient wants to try again - could help her binge eating  Disucced S/E, no current contraindication  Humalog  correction scale: Use in addition to your meal time/short acting insulin  based on blood sugars as follows: 201 - 250: 2 units 251 - 300: 3 units 301 - 350: 4 units 351 - 400: 5 units  No known contraindications/side effects to any of above medications Glucagon  discussed and prescribed with refills on 01/14/25  Previously, Instructed to take potasium 20 mq at bedtime  Patient understands and agreed Has prescription at home  -Last LD and Tg are as follows: Lab Results  Component Value Date   LDLCALC 67 03/25/2024    Lab Results  Component Value Date   TRIG 203 (H) 03/25/2024   -on rosuvastatin  5 mg every day starting 08/14/23 -Follow low fat diet and exercise   -Blood pressure goal <140/90 - Microalbumin/creatinine goal is < 30 -Last MA/Cr is as follows: Lab Results  Component Value Date   MICROALBUR 1.1 12/18/2020   -on ACE/ARB  losartan  25 mg qd -diet changes including salt restriction -limit eating outside -counseled BP targets per standards of diabetes care -uncontrolled blood pressure can lead to retinopathy, nephropathy and cardiovascular and atherosclerotic heart disease  Reviewed and counseled on: -A1C target -Blood sugar targets -Complications of uncontrolled diabetes  -Checking blood sugar before meals and bedtime and bring log next visit -All  medications with mechanism of action and side effects -Hypoglycemia management: rule of 15's, Glucagon  Emergency Kit and medical alert ID -low-carb low-fat plate-method diet -At least 20 minutes of physical activity per day -Annual dilated retinal eye exam and foot exam -compliance and follow up needs -follow up as scheduled or earlier if problem gets worse  Call if blood sugar is less than 70 or consistently above 250    Take a 15 gm snack of carbohydrate at bedtime before you go to sleep if your blood sugar is less than 100.    If you are going to fast after midnight for a test or procedure, ask your physician for instructions on how to reduce/decrease your insulin  dose.    Call if blood sugar is less than 70 or consistently above 250  -Treating a low sugar by rule of 15  (15 gms of sugar every 15 min until sugar is more than 70) If you feel your sugar is low, test your sugar to be sure If your sugar is low (less than 70), then take 15 grams of a fast acting Carbohydrate (3-4 glucose tablets or glucose gel or 4 ounces of juice or regular soda) Recheck your sugar 15 min after treating low to make sure it is more than 70 If sugar is still less than 70, treat again with 15 grams of carbohydrate          Don't drive the hour of hypoglycemia  If unconscious/unable to eat or drink by mouth, use glucagon  injection or nasal spray baqsimi  and call 911. Can repeat again in 15 min if still unconscious.  Return in about 3 months (around 04/14/2025) for visit + labs before next visit.   I have reviewed current medications, nurse's notes, allergies, vital signs, past medical and surgical history, family medical history, and social history for this encounter. Counseled patient on symptoms, examination findings, lab findings, imaging results, treatment decisions and monitoring and prognosis. The patient understood the recommendations and agrees with the treatment plan. All questions regarding treatment  plan were fully answered.  Sydney Birmingham, MD  01/14/25   History of Present Illness Sydney Taylor is a 59 y.o. year old female who presents for follow up of Type II diabetes mellitus.  Sydney Taylor was first diagnosed in 2021.   Diabetes education +  Home diabetes regimen: Jardiance  25 mg daily, metformin  ER 1 g at dinner Tresiba  80 units qam, 80 units qpm     Mounjaro  7.5 mg once a week-no active complaints reported     Side effects from medications: Nausea and vomiting with Trulicity   Previous history: She had mild hyperglycemia in 2020 but had significant increase in glucose of 484 in 3/21  Non-insulin  hypoglycemic drugs previously used: Jardiance  since 9/21, Bydureon , Trulicity , metformin , glipizide  Insulin  was started in 2022  A1c range in the last few years is: 6.4-12.7  COMPLICATIONS -  MI/Stroke -  retinopathy -  neuropathy +  nephropathy  BLOOD SUGAR DATA CGM interpretation: At today's visit, we reviewed her CGM downloads. The full report is scanned in the media. Reviewing the CGM trends, BG are well  controlled most of the day with some highs speially around 12pm-9pm.  Physical Exam  Ht 5' 9 (1.753 m)   Wt 256 lb (116.1 kg)   LMP  (LMP Unknown)   BMI 37.80 kg/m    Constitutional: well developed, well nourished Head: normocephalic, atraumatic Eyes: sclera anicteric, no redness Neck: supple Lungs: normal respiratory effort Neurology: alert and oriented Skin: dry, no appreciable rashes Musculoskeletal: no appreciable defects Psychiatric: normal mood and affect Diabetic Foot Exam - Simple   No data filed      Current Medications Patient's Medications  New Prescriptions   INSULIN  GLARGINE (LANTUS  SOLOSTAR) 100 UNIT/ML SOLOSTAR PEN    Inject 80 Units into the skin 2 (two) times daily.   INSULIN  PEN NEEDLE (PEN NEEDLES) 32G X 4 MM MISC    1 Device by Does not apply route in the morning, at noon, in the evening, and at bedtime.   TIRZEPATIDE   (MOUNJARO ) 10 MG/0.5ML PEN    Inject 10 mg into the skin once a week.  Previous Medications   ALPRAZOLAM (XANAX) 0.5 MG TABLET    Take 0.25 mg by mouth daily as needed for anxiety.   ARIPIPRAZOLE (ABILIFY) 2 MG TABLET    Take 2 mg by mouth daily.   B-12 METHYLCOBALAMIN 1000 MCG TBDP    Take 1,000 mcg by mouth daily.   BLOOD GLUCOSE METER KIT AND SUPPLIES KIT    Inject 1 each into the skin daily. Check blood glucose once a day. Diagnosis code: E 11.9.   BUDESONIDE  (PULMICORT ) 0.5 MG/2ML NEBULIZER SOLUTION    Open/mix contents of 1 vial into 240 ml of sinus rinse bottle and irrigate sinuses twice daily.   CALCIUM  CARBONATE (TUMS - DOSED IN MG ELEMENTAL CALCIUM ) 500 MG CHEWABLE TABLET    Chew 1,000 mg by mouth daily as needed for indigestion or heartburn.   CHLORTHALIDONE  (HYGROTON ) 25 MG TABLET    Take 12.5 mg by mouth daily.   CHOLECALCIFEROL  (VITAMIN D3) 25 MCG (1000 UT) CAPS    Take by mouth daily.   CONTINUOUS GLUCOSE RECEIVER (DEXCOM G7 RECEIVER) DEVI    1 each by Does not apply route continuous.   CONTINUOUS GLUCOSE SENSOR (DEXCOM G7 SENSOR) MISC    Use to check glucose continuously, change sensor every 10 days   DULOXETINE (CYMBALTA) 60 MG CAPSULE    Take 120 mg by mouth every morning. Two capsules (120mg ) daily   GABAPENTIN  (NEURONTIN ) 300 MG CAPSULE    TAKE 2 CAPSULES BY MOUTH AT BEDTIME   GLUCOSE BLOOD (ACCU-CHEK GUIDE) TEST STRIP    1 each by Other route in the morning, at noon, and at bedtime. Use as instructed   INSULIN  LISPRO (HUMALOG  KWIKPEN) 100 UNIT/ML KWIKPEN    Inject 1-5 Units into the skin as needed.   JARDIANCE  25 MG TABS TABLET    TAKE 1 TABLET BY MOUTH ONCE DAILY BEFORE BREAKFAST   L-METHYLFOLATE 15 MG TABS    Take 15 mg by mouth daily at 6 (six) AM.   LOSARTAN  (COZAAR ) 25 MG TABLET    Take 1 tablet (25 mg total) by mouth daily.   MAGNESIUM  GLYCINATE PO    Take by mouth daily.   METFORMIN  (GLUCOPHAGE -XR) 500 MG 24 HR TABLET    TAKE 2 TABLETS BY MOUTH ONCE DAILY WITH SUPPER    METOPROLOL  TARTRATE (LOPRESSOR ) 25 MG TABLET    Take 1 tablet by mouth twice daily   MODAFINIL (PROVIGIL) 100 MG TABLET    Take 100 mg  by mouth every morning.   MOXIFLOXACIN  (AVELOX ) 400 MG TABLET    Take 1 tablet (400 mg total) by mouth daily.   MULTIPLE VITAMINS-MINERALS (MULTIVITAMIN WOMEN 50+) TABS    Take 1 tablet by mouth daily.   NALTREXONE HCL PO    Take by mouth daily.   NONFORMULARY OR COMPOUNDED ITEM    Take 8 mg by mouth at bedtime. [facility]   OMEGA-3 ACID ETHYL ESTERS (LOVAZA ) 1 G CAPSULE    Take 2 capsules (2 g total) by mouth 2 (two) times daily.   PANTOPRAZOLE  (PROTONIX ) 40 MG TABLET    Take 1 tablet (40 mg total) by mouth daily before breakfast.   POTASSIUM CHLORIDE  SA (KLOR-CON  M) 20 MEQ TABLET    Take 1 tablet (20 mEq total) by mouth 2 (two) times daily.   ROSUVASTATIN  (CRESTOR ) 5 MG TABLET    Take 1 tablet (5 mg total) by mouth daily.   SUMATRIPTAN  (IMITREX ) 100 MG TABLET    Take 1 tablet at the onset of migraine. May repeat in 2 hours if headache persists or recurs. Maximum 2 per day.   TOPIRAMATE  (TOPAMAX ) 25 MG TABLET    Take 1 tablet (25 mg total) by mouth at bedtime.   UNABLE TO FIND    Med Name: Mupirocin 20 mg caps. Place contents of 1 capsule into 240 ml of sinus rinse bottle and irrigate sinuses twice daily.  Modified Medications   No medications on file  Discontinued Medications   INSULIN  DEGLUDEC (TRESIBA  FLEXTOUCH) 200 UNIT/ML FLEXTOUCH PEN    Inject 86 Units into the skin 2 (two) times daily.   INSULIN  PEN NEEDLE (RELION PEN NEEDLES) 31G X 6 MM MISC    USE 1 PEN NEEDLE ONCE DAILY   MOUNJARO  7.5 MG/0.5ML PEN    INJECT 7.5MG  SUBCUTANEOUSLY ONCE A WEEK    Allergies Allergies  Allergen Reactions   Augmentin  [Amoxicillin -Pot Clavulanate] Itching   Cephalexin  Itching, Swelling and Other (See Comments)    Past Medical History Past Medical History:  Diagnosis Date   Anemia    Anxiety    Arrhythmia    Arthritis    Bipolar depression (HCC)  12/28/2019   Cataract    Chronic fatigue    Chronic headaches    Depression    Diabetes mellitus without complication (HCC)    Fibromyalgia    GERD (gastroesophageal reflux disease)    Heart murmur    High cholesterol    HLD (hyperlipidemia)    Hypertension    Insect bite    Migraine    Prediabetes    Sleep apnea    Thyroid  disease    Urinary incontinence    Vasculitis     Past Surgical History Past Surgical History:  Procedure Laterality Date   BIOPSY  09/04/2022   Procedure: BIOPSY;  Surgeon: Shaaron Lamar HERO, MD;  [facility]: [facility];  Service: Endoscopy;;   CHOLECYSTECTOMY N/A 03/15/2022   Procedure: LAPAROSCOPIC CHOLECYSTECTOMY WITH INTRAOPERATIVE CHOLANGIOGRAM;  Surgeon: Kallie Manuelita BROCKS, MD;  Location: [facility];  Service: General;  Laterality: N/A;   CHOLECYSTECTOMY  02/03/2022   COLONOSCOPY  2017   per patient normal and needs next one in 2027 (Dr. Tobie)   COLONOSCOPY WITH PROPOFOL  N/A 09/04/2022   Procedure: COLONOSCOPY WITH PROPOFOL ;  Surgeon: Shaaron Lamar HERO, MD;  Location: [facility];  Service: Endoscopy;  Laterality: N/A;   ESOPHAGOGASTRODUODENOSCOPY N/A 05/12/2020   Procedure: ESOPHAGOGASTRODUODENOSCOPY (EGD);  Surgeon: Shaaron Lamar HERO, MD;  Location: [facility];  Service: Endoscopy;  Laterality: N/A;  11:15am   Joint fusion on foot Right    LIVER BIOPSY N/A 03/15/2022   Procedure: LIVER BIOPSY;  Surgeon: Kallie Manuelita BROCKS, MD;  Location: [facility];  Service: General;  Laterality: N/A;   MALONEY DILATION N/A 05/12/2020   Procedure: AGAPITO HODGKIN;  Surgeon: Shaaron Lamar HERO, MD;  Location: [facility];  Service: Endoscopy;  Laterality: N/A;   NASAL SEPTUM SURGERY     POLYPECTOMY  09/04/2022   Procedure: POLYPECTOMY;  Surgeon: Shaaron Lamar HERO, MD;  Location: [facility];  Service: Endoscopy;;   SINOSCOPY      Family History family history includes Alcohol abuse in her paternal grandfather; Anxiety disorder in her father, maternal aunt, mother,  and paternal grandmother; Arthritis in her maternal grandmother and mother; Autoimmune disease in her brother; Cancer in her maternal aunt; Congestive Heart Failure in her mother; Depression in her father, maternal aunt, and paternal grandmother; Diabetes in her father, maternal grandmother, mother, paternal uncle, and sister; Diabetes Mellitus II in her mother; Hearing loss in her father; Heart attack in her mother; Heart disease in her father, maternal grandfather, mother, and paternal grandmother; Hyperlipidemia in her father, mother, and sister; Hypertension in her brother, father, mother, and sister; Lupus in her paternal grandmother; Obesity in her father, maternal grandmother, mother, and sister; Stroke in her father and maternal grandfather; Varicose Veins in her mother; Vision loss in her brother.  Social History Social History   Socioeconomic History   Marital status: Married    Spouse name: Not on file   Number of children: 0   Years of education: Not on file   Highest education level: Bachelor's degree (e.g., BA, AB, BS)  Occupational History   Occupation: research associate/drug development  Tobacco Use   Smoking status: Former    Current packs/day: 0.00    Types: Cigarettes    Quit date: 12/30/1982    Years since quitting: 42.0   Smokeless tobacco: Never  Vaping Use   Vaping status: Never Used  Substance and Sexual Activity   Alcohol use: Not Currently    Alcohol/week: 0.0 standard drinks of alcohol   Drug use: Never   Sexual activity: Yes    Birth control/protection: Post-menopausal  Other Topics Concern   Not on file  Social History Narrative   Married for 3 years.Lives with husband.Bachelors in Building Surveyor.   Right handed   Caffeine: 1-2 per day    Social Drivers of Health   Tobacco Use: Medium Risk (01/14/2025)   Patient History    Smoking Tobacco Use: Former    Smokeless Tobacco Use: Never    Passive Exposure: Not on file  Financial Resource Strain: Low Risk  (06/14/2024)   Overall Financial Resource Strain (CARDIA)    Difficulty of Paying Living Expenses: Not hard at all  Food Insecurity: No Food Insecurity (06/14/2024)   Epic    Worried About Programme Researcher, Broadcasting/film/video in the Last Year: Never true    Ran Out of Food in the Last Year: Never true  Transportation Needs: No Transportation Needs (06/14/2024)   Epic    Lack of Transportation (Medical): No    Lack of Transportation (Non-Medical): No  Physical Activity: Inactive (06/14/2024)   Exercise Vital Sign    Days of Exercise per Week: 0 days    Minutes of Exercise per Session: 0 min  Stress: Stress Concern Present (06/14/2024)   [employer] of Occupational Health - Occupational Stress Questionnaire  Feeling of Stress: To some extent  Social Connections: Moderately Isolated (06/14/2024)   Social Connection and Isolation Panel    Frequency of Communication with Friends and Family: More than three times a week    Frequency of Social Gatherings with Friends and Family: Once a week    Attends Religious Services: Never    Database Administrator or Organizations: No    Attends Banker Meetings: Never    Marital Status: Married  Catering Manager Violence: Not At Risk (06/14/2024)   Epic    Fear of Current or Ex-Partner: No    Emotionally Abused: No    Physically Abused: No    Sexually Abused: No  Depression (PHQ2-9): Low Risk (01/12/2025)   Depression (PHQ2-9)    PHQ-2 Score: 2  Alcohol Screen: Low Risk (06/14/2024)   Alcohol Screen    Last Alcohol Screening Score (AUDIT): 0  Housing: Low Risk (06/14/2024)   Epic    Unable to Pay for Housing in the Last Year: No    Number of Times Moved in the Last Year: 0    Homeless in the Last Year: No  Utilities: Not At Risk (06/14/2024)   Epic    Threatened with loss of utilities: No  Health Literacy: Adequate Health Literacy (06/14/2024)   B1300 Health Literacy    Frequency of need for help with medical instructions: Never    Lab  Results  Component Value Date   HGBA1C 6.7 (H) 08/04/2024   HGBA1C 6.8 (H) 03/25/2024   HGBA1C 7.2 (H) 08/11/2023   Lab Results  Component Value Date   CHOL 136 03/25/2024   Lab Results  Component Value Date   HDL 35 (L) 03/25/2024   Lab Results  Component Value Date   LDLCALC 67 03/25/2024   Lab Results  Component Value Date   TRIG 203 (H) 03/25/2024   Lab Results  Component Value Date   CHOLHDL 3.9 03/25/2024   Lab Results  Component Value Date   CREATININE 0.94 08/04/2024   Lab Results  Component Value Date   GFR 57.44 (L) 08/11/2023   Lab Results  Component Value Date   MICROALBUR 1.1 12/18/2020      Component Value Date/Time   NA 141 08/04/2024 1101   K 3.9 08/04/2024 1101   CL 102 08/04/2024 1101   CO2 23 08/04/2024 1101   GLUCOSE 138 (H) 08/04/2024 1101   GLUCOSE 95 07/14/2024 2230   BUN 9 08/04/2024 1101   CREATININE 0.94 08/04/2024 1101   CREATININE 1.03 03/29/2022 1324   CALCIUM  9.4 08/04/2024 1101   PROT 6.5 09/02/2024 1404   ALBUMIN 4.2 09/02/2024 1404   AST 41 (H) 09/02/2024 1404   ALT 31 09/02/2024 1404   ALKPHOS 91 09/02/2024 1404   BILITOT 0.5 09/02/2024 1404   GFRNONAA >60 07/14/2024 2230   GFRNONAA 77 03/19/2021 1457   GFRAA 89 03/19/2021 1457      Latest Ref Rng & Units 08/04/2024   11:01 AM 07/14/2024   10:30 PM 03/25/2024    4:43 PM  BMP  Glucose 70 - 99 mg/dL 861  95  870   BUN 6 - 24 mg/dL 9  20  14    Creatinine 0.57 - 1.00 mg/dL 9.05  8.96  9.07   BUN/Creat Ratio 9 - 23 10   15    Sodium 134 - 144 mmol/L 141  138  141   Potassium 3.5 - 5.2 mmol/L 3.9  2.8  3.6   Chloride 96 -  106 mmol/L 102  98  101   CO2 20 - 29 mmol/L 23  27  22    Calcium  8.7 - 10.2 mg/dL 9.4  8.4  9.5        Component Value Date/Time   WBC 11.2 (H) 01/10/2025 1307   RBC 4.98 01/10/2025 1307   HGB 16.3 (H) 01/10/2025 1307   HGB 14.1 11/24/2023 1538   HCT 47.5 (H) 01/10/2025 1307   HCT 45.3 11/24/2023 1538   PLT 299 01/10/2025 1307   PLT 389  11/24/2023 1538   MCV 95.4 01/10/2025 1307   MCV 84 11/24/2023 1538   MCH 32.7 01/10/2025 1307   MCHC 34.3 01/10/2025 1307   RDW 13.2 01/10/2025 1307   RDW 15.8 (H) 11/24/2023 1538   LYMPHSABS 2.7 01/10/2025 1307   LYMPHSABS 2.3 11/24/2023 1538   MONOABS 0.9 01/10/2025 1307   EOSABS 0.6 (H) 01/10/2025 1307   EOSABS 0.4 11/24/2023 1538   BASOSABS 0.1 01/10/2025 1307   BASOSABS 0.1 11/24/2023 1538     Parts of this note may have been dictated using voice recognition software. There may be variances in spelling and vocabulary which are unintentional. Not all errors are proofread. Please notify the dino if any discrepancies are noted or if the meaning of any statement is not clear.   "

## 2025-01-17 ENCOUNTER — Ambulatory Visit (HOSPITAL_COMMUNITY)

## 2025-01-18 ENCOUNTER — Encounter: Payer: Self-pay | Admitting: "Endocrinology

## 2025-01-19 ENCOUNTER — Ambulatory Visit: Admitting: Internal Medicine

## 2025-01-20 ENCOUNTER — Encounter: Payer: Self-pay | Admitting: "Endocrinology

## 2025-01-21 MED ORDER — TIRZEPATIDE 7.5 MG/0.5ML ~~LOC~~ SOAJ: 7.5000 mg | SUBCUTANEOUS | 1 refills | Status: AC

## 2025-01-24 ENCOUNTER — Inpatient Hospital Stay

## 2025-01-24 ENCOUNTER — Encounter (HOSPITAL_BASED_OUTPATIENT_CLINIC_OR_DEPARTMENT_OTHER): Payer: Self-pay | Admitting: Pulmonary Disease

## 2025-01-24 NOTE — Telephone Encounter (Signed)
Can you resend order

## 2025-01-26 ENCOUNTER — Ambulatory Visit (HOSPITAL_COMMUNITY)

## 2025-02-02 ENCOUNTER — Ambulatory Visit: Payer: Self-pay | Admitting: Internal Medicine

## 2025-02-02 ENCOUNTER — Inpatient Hospital Stay: Attending: Hematology

## 2025-02-03 ENCOUNTER — Other Ambulatory Visit: Payer: Self-pay

## 2025-02-03 ENCOUNTER — Other Ambulatory Visit: Payer: Self-pay | Admitting: "Endocrinology

## 2025-02-03 DIAGNOSIS — E1165 Type 2 diabetes mellitus with hyperglycemia: Secondary | ICD-10-CM

## 2025-02-03 MED ORDER — LANTUS SOLOSTAR 100 UNIT/ML ~~LOC~~ SOPN: PEN_INJECTOR | SUBCUTANEOUS | 0 refills | Status: AC

## 2025-02-04 ENCOUNTER — Ambulatory Visit (INDEPENDENT_AMBULATORY_CARE_PROVIDER_SITE_OTHER)

## 2025-02-08 ENCOUNTER — Ambulatory Visit: Admitting: General Surgery

## 2025-02-12 ENCOUNTER — Ambulatory Visit (HOSPITAL_COMMUNITY)

## 2025-02-23 ENCOUNTER — Ambulatory Visit (INDEPENDENT_AMBULATORY_CARE_PROVIDER_SITE_OTHER)

## 2025-03-29 ENCOUNTER — Ambulatory Visit: Admitting: Internal Medicine

## 2025-04-15 ENCOUNTER — Other Ambulatory Visit

## 2025-04-22 ENCOUNTER — Telehealth: Admitting: "Endocrinology

## 2025-05-30 ENCOUNTER — Encounter: Admitting: Physical Medicine and Rehabilitation

## 2025-06-15 ENCOUNTER — Ambulatory Visit

## 2025-07-05 ENCOUNTER — Inpatient Hospital Stay

## 2025-07-12 ENCOUNTER — Inpatient Hospital Stay: Admitting: Physician Assistant
